# Patient Record
Sex: Female | Born: 1985 | Race: Black or African American | Hispanic: No | Smoking: Never smoker
Health system: Southern US, Community
[De-identification: ages and names within clinical notes are randomized; demographics above are authoritative.]

## PROBLEM LIST (undated history)

## (undated) ENCOUNTER — Inpatient Hospital Stay (HOSPITAL_COMMUNITY): Payer: Self-pay

## (undated) DIAGNOSIS — D649 Anemia, unspecified: Secondary | ICD-10-CM

## (undated) DIAGNOSIS — G43909 Migraine, unspecified, not intractable, without status migrainosus: Secondary | ICD-10-CM

## (undated) DIAGNOSIS — K219 Gastro-esophageal reflux disease without esophagitis: Secondary | ICD-10-CM

## (undated) DIAGNOSIS — N390 Urinary tract infection, site not specified: Secondary | ICD-10-CM

## (undated) DIAGNOSIS — O039 Complete or unspecified spontaneous abortion without complication: Secondary | ICD-10-CM

## (undated) DIAGNOSIS — F329 Major depressive disorder, single episode, unspecified: Secondary | ICD-10-CM

## (undated) DIAGNOSIS — L309 Dermatitis, unspecified: Secondary | ICD-10-CM

## (undated) DIAGNOSIS — K289 Gastrojejunal ulcer, unspecified as acute or chronic, without hemorrhage or perforation: Secondary | ICD-10-CM

## (undated) DIAGNOSIS — F419 Anxiety disorder, unspecified: Secondary | ICD-10-CM

## (undated) DIAGNOSIS — Z9109 Other allergy status, other than to drugs and biological substances: Secondary | ICD-10-CM

## (undated) DIAGNOSIS — A749 Chlamydial infection, unspecified: Secondary | ICD-10-CM

## (undated) DIAGNOSIS — R7303 Prediabetes: Secondary | ICD-10-CM

## (undated) DIAGNOSIS — N83209 Unspecified ovarian cyst, unspecified side: Secondary | ICD-10-CM

## (undated) DIAGNOSIS — G5600 Carpal tunnel syndrome, unspecified upper limb: Secondary | ICD-10-CM

## (undated) HISTORY — DX: Dermatitis, unspecified: L30.9

## (undated) HISTORY — PX: TYMPANOSTOMY TUBE PLACEMENT: SHX32

## (undated) HISTORY — DX: Gastro-esophageal reflux disease without esophagitis: K21.9

## (undated) HISTORY — PX: CARPAL TUNNEL RELEASE: SHX101

---
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
## 2006-05-17 DIAGNOSIS — F32A Depression, unspecified: Secondary | ICD-10-CM

## 2006-05-17 DIAGNOSIS — F419 Anxiety disorder, unspecified: Secondary | ICD-10-CM

## 2006-05-17 HISTORY — DX: Anxiety disorder, unspecified: F41.9

## 2006-05-17 HISTORY — DX: Depression, unspecified: F32.A

## 2006-10-17 ENCOUNTER — Emergency Department (HOSPITAL_COMMUNITY): Admission: EM | Admit: 2006-10-17 | Discharge: 2006-10-17 | Payer: Self-pay | Admitting: Emergency Medicine

## 2006-11-30 ENCOUNTER — Emergency Department (HOSPITAL_COMMUNITY): Admission: EM | Admit: 2006-11-30 | Discharge: 2006-12-01 | Payer: Self-pay | Admitting: Emergency Medicine

## 2008-05-05 ENCOUNTER — Inpatient Hospital Stay (HOSPITAL_COMMUNITY): Admission: AD | Admit: 2008-05-05 | Discharge: 2008-05-05 | Payer: Self-pay | Admitting: Obstetrics & Gynecology

## 2008-05-05 ENCOUNTER — Ambulatory Visit: Payer: Self-pay | Admitting: Obstetrics and Gynecology

## 2008-05-14 ENCOUNTER — Inpatient Hospital Stay (HOSPITAL_COMMUNITY): Admission: AD | Admit: 2008-05-14 | Discharge: 2008-05-14 | Payer: Self-pay | Admitting: Obstetrics & Gynecology

## 2008-05-15 ENCOUNTER — Encounter: Payer: Self-pay | Admitting: Obstetrics and Gynecology

## 2008-05-15 ENCOUNTER — Ambulatory Visit: Payer: Self-pay | Admitting: Family Medicine

## 2008-05-15 LAB — CONVERTED CEMR LAB
Chlamydia, DNA Probe: NEGATIVE
GC Probe Amp, Genital: NEGATIVE

## 2008-05-16 ENCOUNTER — Encounter: Payer: Self-pay | Admitting: Obstetrics and Gynecology

## 2008-05-22 ENCOUNTER — Ambulatory Visit: Payer: Self-pay | Admitting: Family Medicine

## 2008-05-22 ENCOUNTER — Ambulatory Visit (HOSPITAL_COMMUNITY): Admission: RE | Admit: 2008-05-22 | Discharge: 2008-05-22 | Payer: Self-pay | Admitting: Family Medicine

## 2008-05-22 ENCOUNTER — Encounter: Payer: Self-pay | Admitting: Obstetrics and Gynecology

## 2008-05-22 LAB — CONVERTED CEMR LAB: Antibody Screen: NEGATIVE

## 2008-05-29 ENCOUNTER — Encounter: Payer: Self-pay | Admitting: Obstetrics and Gynecology

## 2008-05-29 ENCOUNTER — Ambulatory Visit: Payer: Self-pay | Admitting: Family Medicine

## 2008-06-02 ENCOUNTER — Ambulatory Visit: Payer: Self-pay | Admitting: Obstetrics and Gynecology

## 2008-06-02 ENCOUNTER — Inpatient Hospital Stay (HOSPITAL_COMMUNITY): Admission: AD | Admit: 2008-06-02 | Discharge: 2008-06-02 | Payer: Self-pay | Admitting: Obstetrics & Gynecology

## 2008-06-03 ENCOUNTER — Inpatient Hospital Stay (HOSPITAL_COMMUNITY): Admission: AD | Admit: 2008-06-03 | Discharge: 2008-06-03 | Payer: Self-pay | Admitting: Obstetrics & Gynecology

## 2008-06-03 ENCOUNTER — Ambulatory Visit: Payer: Self-pay | Admitting: Obstetrics and Gynecology

## 2008-06-04 ENCOUNTER — Ambulatory Visit: Payer: Self-pay | Admitting: Family Medicine

## 2008-06-04 ENCOUNTER — Inpatient Hospital Stay (HOSPITAL_COMMUNITY): Admission: AD | Admit: 2008-06-04 | Discharge: 2008-06-07 | Payer: Self-pay | Admitting: Obstetrics & Gynecology

## 2008-07-01 ENCOUNTER — Emergency Department (HOSPITAL_COMMUNITY): Admission: EM | Admit: 2008-07-01 | Discharge: 2008-07-01 | Payer: Self-pay | Admitting: Emergency Medicine

## 2008-08-19 ENCOUNTER — Emergency Department (HOSPITAL_COMMUNITY): Admission: EM | Admit: 2008-08-19 | Discharge: 2008-08-19 | Payer: Self-pay | Admitting: Emergency Medicine

## 2008-11-07 ENCOUNTER — Emergency Department (HOSPITAL_COMMUNITY): Admission: EM | Admit: 2008-11-07 | Discharge: 2008-11-08 | Payer: Self-pay | Admitting: Emergency Medicine

## 2009-02-12 ENCOUNTER — Emergency Department (HOSPITAL_COMMUNITY): Admission: EM | Admit: 2009-02-12 | Discharge: 2009-02-12 | Payer: Self-pay | Admitting: Emergency Medicine

## 2009-03-03 ENCOUNTER — Emergency Department (HOSPITAL_COMMUNITY): Admission: EM | Admit: 2009-03-03 | Discharge: 2009-03-03 | Payer: Self-pay | Admitting: Emergency Medicine

## 2009-06-17 ENCOUNTER — Emergency Department (HOSPITAL_COMMUNITY): Admission: EM | Admit: 2009-06-17 | Discharge: 2009-06-17 | Payer: Self-pay | Admitting: Family Medicine

## 2009-07-17 ENCOUNTER — Inpatient Hospital Stay (HOSPITAL_COMMUNITY): Admission: AD | Admit: 2009-07-17 | Discharge: 2009-07-17 | Payer: Self-pay | Admitting: Obstetrics

## 2009-08-30 ENCOUNTER — Emergency Department (HOSPITAL_COMMUNITY): Admission: EM | Admit: 2009-08-30 | Discharge: 2009-08-30 | Payer: Self-pay | Admitting: Emergency Medicine

## 2009-11-28 ENCOUNTER — Inpatient Hospital Stay (HOSPITAL_COMMUNITY): Admission: AD | Admit: 2009-11-28 | Discharge: 2009-11-28 | Payer: Self-pay | Admitting: Obstetrics

## 2009-12-14 ENCOUNTER — Ambulatory Visit: Payer: Self-pay | Admitting: Family

## 2009-12-14 ENCOUNTER — Inpatient Hospital Stay (HOSPITAL_COMMUNITY): Admission: AD | Admit: 2009-12-14 | Discharge: 2009-12-15 | Payer: Self-pay | Admitting: Obstetrics

## 2010-01-23 ENCOUNTER — Inpatient Hospital Stay (HOSPITAL_COMMUNITY): Admission: AD | Admit: 2010-01-23 | Discharge: 2010-01-23 | Payer: Self-pay | Admitting: Obstetrics

## 2010-02-02 ENCOUNTER — Ambulatory Visit (HOSPITAL_COMMUNITY): Admission: RE | Admit: 2010-02-02 | Discharge: 2010-02-02 | Payer: Self-pay | Admitting: Obstetrics

## 2010-02-05 ENCOUNTER — Inpatient Hospital Stay (HOSPITAL_COMMUNITY): Admission: AD | Admit: 2010-02-05 | Discharge: 2010-02-05 | Payer: Self-pay | Admitting: Obstetrics

## 2010-02-05 ENCOUNTER — Ambulatory Visit (HOSPITAL_COMMUNITY): Admission: RE | Admit: 2010-02-05 | Discharge: 2010-02-05 | Payer: Self-pay | Admitting: Obstetrics

## 2010-02-07 ENCOUNTER — Inpatient Hospital Stay (HOSPITAL_COMMUNITY): Admission: AD | Admit: 2010-02-07 | Discharge: 2010-02-09 | Payer: Self-pay | Admitting: Obstetrics

## 2010-03-09 ENCOUNTER — Emergency Department (HOSPITAL_COMMUNITY): Admission: EM | Admit: 2010-03-09 | Discharge: 2010-03-10 | Payer: Self-pay | Admitting: Emergency Medicine

## 2010-04-13 ENCOUNTER — Emergency Department (HOSPITAL_COMMUNITY)
Admission: EM | Admit: 2010-04-13 | Discharge: 2010-04-13 | Payer: Self-pay | Source: Home / Self Care | Admitting: Emergency Medicine

## 2010-05-19 ENCOUNTER — Emergency Department (HOSPITAL_COMMUNITY)
Admission: EM | Admit: 2010-05-19 | Discharge: 2010-05-19 | Payer: Self-pay | Source: Home / Self Care | Admitting: Emergency Medicine

## 2010-05-19 ENCOUNTER — Emergency Department (HOSPITAL_COMMUNITY)
Admission: EM | Admit: 2010-05-19 | Discharge: 2010-05-19 | Payer: Self-pay | Source: Home / Self Care | Admitting: Family Medicine

## 2010-06-07 ENCOUNTER — Encounter: Payer: Self-pay | Admitting: Obstetrics

## 2010-06-10 ENCOUNTER — Emergency Department (HOSPITAL_COMMUNITY)
Admission: EM | Admit: 2010-06-10 | Discharge: 2010-06-10 | Payer: Self-pay | Source: Home / Self Care | Admitting: Emergency Medicine

## 2010-06-10 LAB — POCT I-STAT, CHEM 8
BUN: 10 mg/dL (ref 6–23)
Calcium, Ion: 1.19 mmol/L (ref 1.12–1.32)
Chloride: 107 mEq/L (ref 96–112)
Creatinine, Ser: 1 mg/dL (ref 0.4–1.2)
Glucose, Bld: 76 mg/dL (ref 70–99)
HCT: 38 % (ref 36.0–46.0)
Hemoglobin: 12.9 g/dL (ref 12.0–15.0)
Potassium: 3.9 mEq/L (ref 3.5–5.1)
Sodium: 141 mEq/L (ref 135–145)
TCO2: 25 mmol/L (ref 0–100)

## 2010-06-10 LAB — POCT CARDIAC MARKERS
CKMB, poc: <1 ng/mL — ABNORMAL LOW (ref 1.0–8.0)
Myoglobin, poc: 35.5 ng/mL (ref 12–200)
Troponin i, poc: <0.05 ng/mL (ref 0.00–0.09)

## 2010-06-26 ENCOUNTER — Inpatient Hospital Stay (INDEPENDENT_AMBULATORY_CARE_PROVIDER_SITE_OTHER)
Admit: 2010-06-26 | Discharge: 2010-06-26 | Disposition: A | Discharge status: Home / Self Care | Payer: Medicaid Other | Attending: Family Medicine | Admitting: Family Medicine

## 2010-06-26 ENCOUNTER — Emergency Department (HOSPITAL_COMMUNITY)
Admission: EM | Admit: 2010-06-26 | Discharge: 2010-06-26 | Discharge status: Against Medical Advice | Payer: Medicaid Other | Attending: Emergency Medicine | Admitting: Emergency Medicine

## 2010-06-26 DIAGNOSIS — F329 Major depressive disorder, single episode, unspecified: Secondary | ICD-10-CM

## 2010-06-26 DIAGNOSIS — I10 Essential (primary) hypertension: Secondary | ICD-10-CM | POA: Insufficient documentation

## 2010-06-26 DIAGNOSIS — F411 Generalized anxiety disorder: Secondary | ICD-10-CM | POA: Insufficient documentation

## 2010-06-26 DIAGNOSIS — J45909 Unspecified asthma, uncomplicated: Secondary | ICD-10-CM | POA: Insufficient documentation

## 2010-06-26 DIAGNOSIS — F3289 Other specified depressive episodes: Secondary | ICD-10-CM | POA: Insufficient documentation

## 2010-06-26 DIAGNOSIS — K219 Gastro-esophageal reflux disease without esophagitis: Secondary | ICD-10-CM | POA: Insufficient documentation

## 2010-07-27 LAB — DIFFERENTIAL
Basophils Absolute: 0 10*3/uL (ref 0.0–0.1)
Basophils Relative: 0 % (ref 0–1)
Eosinophils Absolute: 0.3 10*3/uL (ref 0.0–0.7)
Eosinophils Relative: 4 % (ref 0–5)
Lymphocytes Relative: 40 % (ref 12–46)
Lymphs Abs: 3.2 10*3/uL (ref 0.7–4.0)
Monocytes Absolute: 0.4 10*3/uL (ref 0.1–1.0)
Monocytes Relative: 5 % (ref 3–12)
Neutro Abs: 4.1 10*3/uL (ref 1.7–7.7)
Neutrophils Relative %: 51 % (ref 43–77)

## 2010-07-27 LAB — COMPREHENSIVE METABOLIC PANEL
ALT: 29 U/L (ref 0–35)
AST: 20 U/L (ref 0–37)
Albumin: 3.6 g/dL (ref 3.5–5.2)
Alkaline Phosphatase: 75 U/L (ref 39–117)
BUN: 8 mg/dL (ref 6–23)
CO2: 24 mEq/L (ref 19–32)
Calcium: 9.2 mg/dL (ref 8.4–10.5)
Chloride: 107 mEq/L (ref 96–112)
Creatinine, Ser: 0.75 mg/dL (ref 0.4–1.2)
GFR calc Af Amer: >60 mL/min (ref >60)
GFR calc non Af Amer: >60 mL/min (ref >60)
Glucose, Bld: 101 mg/dL — ABNORMAL HIGH (ref 70–99)
Potassium: 3.8 mEq/L (ref 3.5–5.1)
Sodium: 140 mEq/L (ref 135–145)
Total Bilirubin: 0.2 mg/dL — ABNORMAL LOW (ref 0.3–1.2)
Total Protein: 7.6 g/dL (ref 6.0–8.3)

## 2010-07-27 LAB — CBC
HCT: 38 % (ref 36.0–46.0)
Hemoglobin: 12.1 g/dL (ref 12.0–15.0)
MCH: 26.2 pg (ref 26.0–34.0)
MCHC: 31.8 g/dL (ref 30.0–36.0)
MCV: 82.4 fL (ref 78.0–100.0)
Platelets: 340 10*3/uL (ref 150–400)
RBC: 4.61 MIL/uL (ref 3.87–5.11)
RDW: 14.1 % (ref 11.5–15.5)
WBC: 8.1 10*3/uL (ref 4.0–10.5)

## 2010-07-27 LAB — URINALYSIS, ROUTINE W REFLEX MICROSCOPIC
Bilirubin Urine: NEGATIVE
Glucose, UA: NEGATIVE mg/dL
Hgb urine dipstick: NEGATIVE
Ketones, ur: NEGATIVE mg/dL
Nitrite: NEGATIVE
Protein, ur: NEGATIVE mg/dL
Specific Gravity, Urine: 1.009 (ref 1.005–1.030)
Urobilinogen, UA: 0.2 mg/dL (ref 0.0–1.0)
pH: 6 (ref 5.0–8.0)

## 2010-07-27 LAB — POCT URINALYSIS DIPSTICK
Bilirubin Urine: NEGATIVE
Glucose, UA: NEGATIVE mg/dL
Ketones, ur: NEGATIVE mg/dL
Nitrite: NEGATIVE
Protein, ur: NEGATIVE mg/dL
Specific Gravity, Urine: 1.01 (ref 1.005–1.030)
Urobilinogen, UA: 1 mg/dL (ref 0.0–1.0)
pH: 7 (ref 5.0–8.0)

## 2010-07-27 LAB — URINE MICROSCOPIC-ADD ON

## 2010-07-27 LAB — POCT PREGNANCY, URINE
Preg Test, Ur: NEGATIVE
Preg Test, Ur: NEGATIVE

## 2010-07-27 LAB — LIPASE, BLOOD: Lipase: 22 U/L (ref 11–59)

## 2010-07-29 LAB — URINALYSIS, ROUTINE W REFLEX MICROSCOPIC
Bilirubin Urine: NEGATIVE
Glucose, UA: NEGATIVE mg/dL
Hgb urine dipstick: NEGATIVE
Specific Gravity, Urine: 1.024 (ref 1.005–1.030)
Urobilinogen, UA: 1 mg/dL (ref 0.0–1.0)
pH: 6.5 (ref 5.0–8.0)

## 2010-07-29 LAB — DIFFERENTIAL
Basophils Absolute: 0 10*3/uL (ref 0.0–0.1)
Lymphocytes Relative: 41 % (ref 12–46)
Monocytes Absolute: 0.7 10*3/uL (ref 0.1–1.0)
Monocytes Relative: 6 % (ref 3–12)
Neutro Abs: 5 10*3/uL (ref 1.7–7.7)
Neutrophils Relative %: 47 % (ref 43–77)

## 2010-07-29 LAB — URINE MICROSCOPIC-ADD ON

## 2010-07-29 LAB — CBC
HCT: 41.4 % (ref 36.0–46.0)
Hemoglobin: 13 g/dL (ref 12.0–15.0)
WBC: 10.7 10*3/uL — ABNORMAL HIGH (ref 4.0–10.5)

## 2010-07-29 LAB — BASIC METABOLIC PANEL
Calcium: 9 mg/dL (ref 8.4–10.5)
GFR calc Af Amer: >60 mL/min (ref >60)
GFR calc non Af Amer: >60 mL/min (ref >60)
Potassium: 4 mEq/L (ref 3.5–5.1)
Sodium: 139 mEq/L (ref 135–145)

## 2010-07-29 LAB — URINE CULTURE

## 2010-07-29 LAB — WET PREP, GENITAL

## 2010-07-30 LAB — CBC
HCT: 29.2 % — ABNORMAL LOW (ref 36.0–46.0)
HCT: 34.1 % — ABNORMAL LOW (ref 36.0–46.0)
Hemoglobin: 11.1 g/dL — ABNORMAL LOW (ref 12.0–15.0)
Hemoglobin: 9.5 g/dL — ABNORMAL LOW (ref 12.0–15.0)
MCHC: 32.5 g/dL (ref 30.0–36.0)
MCV: 82.6 fL (ref 78.0–100.0)
RBC: 4.1 MIL/uL (ref 3.87–5.11)
RDW: 14.8 % (ref 11.5–15.5)
WBC: 17.2 10*3/uL — ABNORMAL HIGH (ref 4.0–10.5)

## 2010-07-30 LAB — RPR: RPR Ser Ql: NONREACTIVE

## 2010-07-31 LAB — URINALYSIS, ROUTINE W REFLEX MICROSCOPIC
Bilirubin Urine: NEGATIVE
Glucose, UA: 100 mg/dL — AB
Hgb urine dipstick: NEGATIVE
Specific Gravity, Urine: >1.03 — ABNORMAL HIGH (ref 1.005–1.030)
Urobilinogen, UA: 4 mg/dL — ABNORMAL HIGH (ref 0.0–1.0)
pH: 6.5 (ref 5.0–8.0)

## 2010-07-31 LAB — GLUCOSE, CAPILLARY: Glucose-Capillary: 88 mg/dL (ref 70–99)

## 2010-07-31 LAB — URINE MICROSCOPIC-ADD ON

## 2010-07-31 LAB — WET PREP, GENITAL
Trich, Wet Prep: NONE SEEN
Yeast Wet Prep HPF POC: NONE SEEN

## 2010-07-31 LAB — URINE CULTURE: Culture: NO GROWTH

## 2010-08-01 LAB — URINALYSIS, ROUTINE W REFLEX MICROSCOPIC
Hgb urine dipstick: NEGATIVE
Nitrite: NEGATIVE
Protein, ur: NEGATIVE mg/dL
Specific Gravity, Urine: >1.03 — ABNORMAL HIGH (ref 1.005–1.030)
Urobilinogen, UA: 1 mg/dL (ref 0.0–1.0)

## 2010-08-01 LAB — DIFFERENTIAL
Basophils Absolute: 0.1 10*3/uL (ref 0.0–0.1)
Eosinophils Absolute: 0.3 10*3/uL (ref 0.0–0.7)
Eosinophils Relative: 3 % (ref 0–5)
Lymphocytes Relative: 24 % (ref 12–46)
Lymphs Abs: 2.6 10*3/uL (ref 0.7–4.0)
Neutrophils Relative %: 66 % (ref 43–77)

## 2010-08-01 LAB — CBC
MCV: 85.3 fL (ref 78.0–100.0)
Platelets: 213 10*3/uL (ref 150–400)
RBC: 3.67 MIL/uL — ABNORMAL LOW (ref 3.87–5.11)
RDW: 13.7 % (ref 11.5–15.5)
WBC: 11 10*3/uL — ABNORMAL HIGH (ref 4.0–10.5)

## 2010-08-04 LAB — URINALYSIS, ROUTINE W REFLEX MICROSCOPIC
Ketones, ur: NEGATIVE mg/dL
Nitrite: NEGATIVE
Protein, ur: NEGATIVE mg/dL
pH: 6 (ref 5.0–8.0)

## 2010-08-04 LAB — CBC
Platelets: 268 10*3/uL (ref 150–400)
RBC: 4.02 MIL/uL (ref 3.87–5.11)
WBC: 10.8 10*3/uL — ABNORMAL HIGH (ref 4.0–10.5)

## 2010-08-04 LAB — BASIC METABOLIC PANEL
BUN: 8 mg/dL (ref 6–23)
Calcium: 9 mg/dL (ref 8.4–10.5)
Creatinine, Ser: 0.61 mg/dL (ref 0.4–1.2)
GFR calc Af Amer: >60 mL/min (ref >60)
GFR calc non Af Amer: >60 mL/min (ref >60)

## 2010-08-04 LAB — DIFFERENTIAL
Eosinophils Absolute: 0.7 10*3/uL (ref 0.0–0.7)
Lymphocytes Relative: 25 % (ref 12–46)
Lymphs Abs: 2.7 10*3/uL (ref 0.7–4.0)
Neutro Abs: 6.6 10*3/uL (ref 1.7–7.7)
Neutrophils Relative %: 61 % (ref 43–77)

## 2010-08-05 ENCOUNTER — Emergency Department (HOSPITAL_COMMUNITY)
Admission: EM | Admit: 2010-08-05 | Discharge: 2010-08-05 | Disposition: A | Discharge status: Home / Self Care | Payer: Medicaid Other | Attending: Emergency Medicine | Admitting: Emergency Medicine

## 2010-08-05 ENCOUNTER — Emergency Department (HOSPITAL_COMMUNITY): Payer: Medicaid Other

## 2010-08-05 DIAGNOSIS — M25569 Pain in unspecified knee: Secondary | ICD-10-CM | POA: Insufficient documentation

## 2010-08-05 DIAGNOSIS — G8929 Other chronic pain: Secondary | ICD-10-CM | POA: Insufficient documentation

## 2010-08-05 DIAGNOSIS — J45909 Unspecified asthma, uncomplicated: Secondary | ICD-10-CM | POA: Insufficient documentation

## 2010-08-05 DIAGNOSIS — I10 Essential (primary) hypertension: Secondary | ICD-10-CM | POA: Insufficient documentation

## 2010-08-05 DIAGNOSIS — M25579 Pain in unspecified ankle and joints of unspecified foot: Secondary | ICD-10-CM | POA: Insufficient documentation

## 2010-08-05 DIAGNOSIS — M79609 Pain in unspecified limb: Secondary | ICD-10-CM | POA: Insufficient documentation

## 2010-08-05 DIAGNOSIS — K219 Gastro-esophageal reflux disease without esophagitis: Secondary | ICD-10-CM | POA: Insufficient documentation

## 2010-08-05 LAB — POCT URINALYSIS DIP (DEVICE)
Bilirubin Urine: NEGATIVE
Glucose, UA: NEGATIVE mg/dL
Ketones, ur: NEGATIVE mg/dL
Nitrite: NEGATIVE
Specific Gravity, Urine: 1.02 (ref 1.005–1.030)

## 2010-08-05 LAB — GC/CHLAMYDIA PROBE AMP, GENITAL
Chlamydia, DNA Probe: NEGATIVE
GC Probe Amp, Genital: NEGATIVE

## 2010-08-05 LAB — WET PREP, GENITAL: Yeast Wet Prep HPF POC: NONE SEEN

## 2010-08-10 LAB — URINALYSIS, ROUTINE W REFLEX MICROSCOPIC
Bilirubin Urine: NEGATIVE
Glucose, UA: 250 mg/dL — AB
Nitrite: NEGATIVE
Specific Gravity, Urine: 1.02 (ref 1.005–1.030)
pH: 6.5 (ref 5.0–8.0)

## 2010-08-10 LAB — URINE MICROSCOPIC-ADD ON

## 2010-08-20 LAB — URINALYSIS, ROUTINE W REFLEX MICROSCOPIC
Bilirubin Urine: NEGATIVE
Nitrite: NEGATIVE
Specific Gravity, Urine: 1.028 (ref 1.005–1.030)
Urobilinogen, UA: 1 mg/dL (ref 0.0–1.0)

## 2010-08-20 LAB — POCT PREGNANCY, URINE: Preg Test, Ur: NEGATIVE

## 2010-08-21 LAB — URINALYSIS, ROUTINE W REFLEX MICROSCOPIC
Bilirubin Urine: NEGATIVE
Ketones, ur: NEGATIVE mg/dL
Nitrite: NEGATIVE
pH: 6 (ref 5.0–8.0)

## 2010-08-21 LAB — URINE MICROSCOPIC-ADD ON

## 2010-08-21 LAB — POCT PREGNANCY, URINE: Preg Test, Ur: NEGATIVE

## 2010-08-31 LAB — GLUCOSE, CAPILLARY
Glucose-Capillary: 83 mg/dL (ref 70–99)
Glucose-Capillary: 94 mg/dL (ref 70–99)

## 2010-08-31 LAB — POCT URINALYSIS DIP (DEVICE)
Bilirubin Urine: NEGATIVE
Glucose, UA: 250 mg/dL — AB
Glucose, UA: 500 mg/dL — AB
Nitrite: NEGATIVE
Nitrite: NEGATIVE
Specific Gravity, Urine: 1.015 (ref 1.005–1.030)
Urobilinogen, UA: 0.2 mg/dL (ref 0.0–1.0)

## 2010-08-31 LAB — CBC
Hemoglobin: 11.1 g/dL — ABNORMAL LOW (ref 12.0–15.0)
RBC: 3.95 MIL/uL (ref 3.87–5.11)
WBC: 14.4 10*3/uL — ABNORMAL HIGH (ref 4.0–10.5)

## 2010-09-24 ENCOUNTER — Emergency Department (HOSPITAL_COMMUNITY)
Admission: EM | Admit: 2010-09-24 | Discharge: 2010-09-25 | Disposition: A | Discharge status: Home / Self Care | Payer: Medicaid Other | Attending: Emergency Medicine | Admitting: Emergency Medicine

## 2010-09-24 DIAGNOSIS — F329 Major depressive disorder, single episode, unspecified: Secondary | ICD-10-CM | POA: Insufficient documentation

## 2010-09-24 DIAGNOSIS — Z3202 Encounter for pregnancy test, result negative: Secondary | ICD-10-CM | POA: Insufficient documentation

## 2010-09-24 DIAGNOSIS — K219 Gastro-esophageal reflux disease without esophagitis: Secondary | ICD-10-CM | POA: Insufficient documentation

## 2010-09-24 DIAGNOSIS — F3289 Other specified depressive episodes: Secondary | ICD-10-CM | POA: Insufficient documentation

## 2010-09-24 DIAGNOSIS — R21 Rash and other nonspecific skin eruption: Secondary | ICD-10-CM | POA: Insufficient documentation

## 2010-09-24 DIAGNOSIS — J45909 Unspecified asthma, uncomplicated: Secondary | ICD-10-CM | POA: Insufficient documentation

## 2010-09-25 LAB — URINE MICROSCOPIC-ADD ON

## 2010-09-25 LAB — URINALYSIS, ROUTINE W REFLEX MICROSCOPIC
Ketones, ur: NEGATIVE mg/dL
Nitrite: NEGATIVE
Protein, ur: NEGATIVE mg/dL

## 2010-09-25 LAB — POCT PREGNANCY, URINE: Preg Test, Ur: NEGATIVE

## 2010-09-27 ENCOUNTER — Inpatient Hospital Stay (INDEPENDENT_AMBULATORY_CARE_PROVIDER_SITE_OTHER)
Admission: RE | Admit: 2010-09-27 | Discharge: 2010-09-27 | Disposition: A | Discharge status: Home / Self Care | Payer: Medicaid Other | Source: Ambulatory Visit

## 2010-09-27 DIAGNOSIS — A499 Bacterial infection, unspecified: Secondary | ICD-10-CM

## 2010-09-27 DIAGNOSIS — M79609 Pain in unspecified limb: Secondary | ICD-10-CM

## 2010-09-27 DIAGNOSIS — N760 Acute vaginitis: Secondary | ICD-10-CM

## 2010-09-27 LAB — WET PREP, GENITAL: Trich, Wet Prep: NONE SEEN

## 2010-09-28 LAB — POCT URINALYSIS DIP (DEVICE)
Bilirubin Urine: NEGATIVE
Glucose, UA: NEGATIVE mg/dL
Hgb urine dipstick: NEGATIVE
Ketones, ur: NEGATIVE mg/dL
Nitrite: NEGATIVE
Protein, ur: NEGATIVE mg/dL
Specific Gravity, Urine: 1.02 (ref 1.005–1.030)
Urobilinogen, UA: 1 mg/dL (ref 0.0–1.0)
pH: 7.5 (ref 5.0–8.0)

## 2010-09-28 LAB — GC/CHLAMYDIA PROBE AMP, GENITAL
Chlamydia, DNA Probe: NEGATIVE
GC Probe Amp, Genital: NEGATIVE

## 2010-09-28 LAB — POCT PREGNANCY, URINE: Preg Test, Ur: NEGATIVE

## 2010-11-12 ENCOUNTER — Emergency Department (HOSPITAL_COMMUNITY)
Admission: EM | Admit: 2010-11-12 | Discharge: 2010-11-12 | Disposition: A | Discharge status: Home / Self Care | Payer: Medicaid Other | Attending: Emergency Medicine | Admitting: Emergency Medicine

## 2010-11-12 DIAGNOSIS — R0602 Shortness of breath: Secondary | ICD-10-CM | POA: Insufficient documentation

## 2010-11-12 DIAGNOSIS — R0989 Other specified symptoms and signs involving the circulatory and respiratory systems: Secondary | ICD-10-CM | POA: Insufficient documentation

## 2010-11-12 DIAGNOSIS — R0609 Other forms of dyspnea: Secondary | ICD-10-CM | POA: Insufficient documentation

## 2010-11-12 DIAGNOSIS — J45909 Unspecified asthma, uncomplicated: Secondary | ICD-10-CM | POA: Insufficient documentation

## 2010-11-12 DIAGNOSIS — J3489 Other specified disorders of nose and nasal sinuses: Secondary | ICD-10-CM | POA: Insufficient documentation

## 2010-11-12 DIAGNOSIS — Z9109 Other allergy status, other than to drugs and biological substances: Secondary | ICD-10-CM | POA: Insufficient documentation

## 2010-11-12 DIAGNOSIS — J309 Allergic rhinitis, unspecified: Secondary | ICD-10-CM | POA: Insufficient documentation

## 2010-12-09 ENCOUNTER — Emergency Department (HOSPITAL_COMMUNITY)
Admission: EM | Admit: 2010-12-09 | Discharge: 2010-12-09 | Disposition: A | Discharge status: Home / Self Care | Payer: Worker's Compensation | Attending: Emergency Medicine | Admitting: Emergency Medicine

## 2010-12-09 ENCOUNTER — Emergency Department (HOSPITAL_COMMUNITY): Payer: Worker's Compensation

## 2010-12-09 DIAGNOSIS — J45909 Unspecified asthma, uncomplicated: Secondary | ICD-10-CM | POA: Insufficient documentation

## 2010-12-09 DIAGNOSIS — M79609 Pain in unspecified limb: Secondary | ICD-10-CM | POA: Insufficient documentation

## 2010-12-09 DIAGNOSIS — K219 Gastro-esophageal reflux disease without esophagitis: Secondary | ICD-10-CM | POA: Insufficient documentation

## 2010-12-09 DIAGNOSIS — S6000XA Contusion of unspecified finger without damage to nail, initial encounter: Secondary | ICD-10-CM | POA: Insufficient documentation

## 2010-12-09 DIAGNOSIS — M7989 Other specified soft tissue disorders: Secondary | ICD-10-CM | POA: Insufficient documentation

## 2010-12-09 DIAGNOSIS — Y990 Civilian activity done for income or pay: Secondary | ICD-10-CM | POA: Insufficient documentation

## 2010-12-09 DIAGNOSIS — IMO0002 Reserved for concepts with insufficient information to code with codable children: Secondary | ICD-10-CM | POA: Insufficient documentation

## 2010-12-09 DIAGNOSIS — S6990XA Unspecified injury of unspecified wrist, hand and finger(s), initial encounter: Secondary | ICD-10-CM | POA: Insufficient documentation

## 2010-12-09 LAB — POCT PREGNANCY, URINE: Preg Test, Ur: NEGATIVE

## 2010-12-30 ENCOUNTER — Inpatient Hospital Stay (INDEPENDENT_AMBULATORY_CARE_PROVIDER_SITE_OTHER)
Admission: RE | Admit: 2010-12-30 | Discharge: 2010-12-30 | Disposition: A | Discharge status: Home / Self Care | Payer: Medicaid Other | Source: Ambulatory Visit | Attending: Family Medicine | Admitting: Family Medicine

## 2010-12-30 DIAGNOSIS — A599 Trichomoniasis, unspecified: Secondary | ICD-10-CM

## 2010-12-30 LAB — POCT URINALYSIS DIP (DEVICE)
Nitrite: NEGATIVE
Protein, ur: NEGATIVE mg/dL
Specific Gravity, Urine: >=1.03 (ref 1.005–1.030)
Urobilinogen, UA: 1 mg/dL (ref 0.0–1.0)

## 2010-12-30 LAB — WET PREP, GENITAL

## 2010-12-31 LAB — GC/CHLAMYDIA PROBE AMP, GENITAL
Chlamydia, DNA Probe: NEGATIVE
GC Probe Amp, Genital: NEGATIVE

## 2011-02-19 LAB — URINALYSIS, ROUTINE W REFLEX MICROSCOPIC
Bilirubin Urine: NEGATIVE
Bilirubin Urine: NEGATIVE
Glucose, UA: 500 mg/dL — AB
Hgb urine dipstick: NEGATIVE
Hgb urine dipstick: NEGATIVE
Protein, ur: NEGATIVE mg/dL
Protein, ur: NEGATIVE mg/dL
Urobilinogen, UA: 0.2 mg/dL (ref 0.0–1.0)

## 2011-02-19 LAB — RAPID URINE DRUG SCREEN, HOSP PERFORMED
Amphetamines: NOT DETECTED
Barbiturates: NOT DETECTED
Benzodiazepines: NOT DETECTED
Tetrahydrocannabinol: NOT DETECTED

## 2011-02-19 LAB — POCT URINALYSIS DIP (DEVICE)
Glucose, UA: 500 mg/dL — AB
Nitrite: NEGATIVE

## 2011-02-19 LAB — RPR: RPR Ser Ql: NONREACTIVE

## 2011-02-19 LAB — GLUCOSE, CAPILLARY
Glucose-Capillary: 75 mg/dL (ref 70–99)
Glucose-Capillary: 83 mg/dL (ref 70–99)

## 2011-02-19 LAB — CBC
Hemoglobin: 10.7 g/dL — ABNORMAL LOW (ref 12.0–15.0)
MCHC: 33.1 g/dL (ref 30.0–36.0)
RBC: 3.74 MIL/uL — ABNORMAL LOW (ref 3.87–5.11)
WBC: 8.9 10*3/uL (ref 4.0–10.5)

## 2011-02-19 LAB — RUBELLA SCREEN: Rubella: 23.2 IU/mL — ABNORMAL HIGH

## 2011-02-19 LAB — HEPATITIS B SURFACE ANTIGEN: Hepatitis B Surface Ag: NEGATIVE

## 2011-02-19 LAB — URINE MICROSCOPIC-ADD ON

## 2011-02-19 LAB — ABO/RH: ABO/RH(D): A POS

## 2011-02-19 LAB — SICKLE CELL SCREEN: Sickle Cell Screen: NEGATIVE

## 2011-03-01 LAB — DIFFERENTIAL
Basophils Absolute: 0
Lymphocytes Relative: 32
Monocytes Absolute: 0.6
Monocytes Relative: 8
Neutro Abs: 4.4

## 2011-03-01 LAB — CBC
Hemoglobin: 11.3 — ABNORMAL LOW
RBC: 4.2

## 2011-03-01 LAB — URINALYSIS, ROUTINE W REFLEX MICROSCOPIC
Glucose, UA: NEGATIVE
Nitrite: NEGATIVE
Protein, ur: NEGATIVE
pH: 6.5

## 2011-03-01 LAB — URINE MICROSCOPIC-ADD ON

## 2011-03-01 LAB — I-STAT 8, (EC8 V) (CONVERTED LAB)
BUN: 12
Bicarbonate: 21
Chloride: 109
HCT: 38
Hemoglobin: 12.9
Operator id: 272551
Sodium: 139

## 2011-03-01 LAB — POCT I-STAT CREATININE: Creatinine, Ser: 0.8

## 2011-04-09 ENCOUNTER — Encounter: Payer: Self-pay | Admitting: *Deleted

## 2011-04-09 ENCOUNTER — Emergency Department (HOSPITAL_COMMUNITY)
Admission: EM | Admit: 2011-04-09 | Discharge: 2011-04-09 | Disposition: A | Discharge status: Home / Self Care | Payer: Medicaid Other | Attending: Emergency Medicine | Admitting: Emergency Medicine

## 2011-04-09 DIAGNOSIS — R3915 Urgency of urination: Secondary | ICD-10-CM | POA: Insufficient documentation

## 2011-04-09 DIAGNOSIS — J45909 Unspecified asthma, uncomplicated: Secondary | ICD-10-CM | POA: Insufficient documentation

## 2011-04-09 DIAGNOSIS — R109 Unspecified abdominal pain: Secondary | ICD-10-CM | POA: Insufficient documentation

## 2011-04-09 DIAGNOSIS — N949 Unspecified condition associated with female genital organs and menstrual cycle: Secondary | ICD-10-CM | POA: Insufficient documentation

## 2011-04-09 DIAGNOSIS — N760 Acute vaginitis: Secondary | ICD-10-CM | POA: Insufficient documentation

## 2011-04-09 DIAGNOSIS — S335XXA Sprain of ligaments of lumbar spine, initial encounter: Secondary | ICD-10-CM | POA: Insufficient documentation

## 2011-04-09 DIAGNOSIS — K219 Gastro-esophageal reflux disease without esophagitis: Secondary | ICD-10-CM | POA: Insufficient documentation

## 2011-04-09 DIAGNOSIS — R350 Frequency of micturition: Secondary | ICD-10-CM | POA: Insufficient documentation

## 2011-04-09 DIAGNOSIS — F411 Generalized anxiety disorder: Secondary | ICD-10-CM | POA: Insufficient documentation

## 2011-04-09 DIAGNOSIS — S39012A Strain of muscle, fascia and tendon of lower back, initial encounter: Secondary | ICD-10-CM

## 2011-04-09 DIAGNOSIS — Z79899 Other long term (current) drug therapy: Secondary | ICD-10-CM | POA: Insufficient documentation

## 2011-04-09 DIAGNOSIS — A499 Bacterial infection, unspecified: Secondary | ICD-10-CM | POA: Insufficient documentation

## 2011-04-09 DIAGNOSIS — R102 Pelvic and perineal pain: Secondary | ICD-10-CM

## 2011-04-09 DIAGNOSIS — R10819 Abdominal tenderness, unspecified site: Secondary | ICD-10-CM | POA: Insufficient documentation

## 2011-04-09 DIAGNOSIS — X58XXXA Exposure to other specified factors, initial encounter: Secondary | ICD-10-CM | POA: Insufficient documentation

## 2011-04-09 DIAGNOSIS — B9689 Other specified bacterial agents as the cause of diseases classified elsewhere: Secondary | ICD-10-CM | POA: Insufficient documentation

## 2011-04-09 HISTORY — DX: Anxiety disorder, unspecified: F41.9

## 2011-04-09 LAB — URINALYSIS, ROUTINE W REFLEX MICROSCOPIC
Bilirubin Urine: NEGATIVE
Hgb urine dipstick: NEGATIVE
Ketones, ur: NEGATIVE mg/dL
Nitrite: NEGATIVE
pH: 6 (ref 5.0–8.0)

## 2011-04-09 LAB — URINE MICROSCOPIC-ADD ON

## 2011-04-09 LAB — WET PREP, GENITAL: Trich, Wet Prep: NONE SEEN

## 2011-04-09 MED ORDER — HYDROCODONE-ACETAMINOPHEN 5-325 MG PO TABS
2.0000 | ORAL_TABLET | Freq: Once | ORAL | Status: AC
  Administered 2011-04-09: 2 via ORAL
  Filled 2011-04-09: qty 2

## 2011-04-09 MED ORDER — HYDROCODONE-ACETAMINOPHEN 5-325 MG PO TABS: 2.0000 | ORAL_TABLET | Freq: Once | ORAL | Status: AC

## 2011-04-09 MED ORDER — IBUPROFEN 600 MG PO TABS: 600.0000 mg | ORAL_TABLET | Freq: Four times a day (QID) | ORAL | Status: AC | PRN

## 2011-04-09 MED ORDER — GI COCKTAIL ~~LOC~~: 30.0000 mL | Freq: Once | ORAL | Status: DC

## 2011-04-09 MED ORDER — METRONIDAZOLE 500 MG PO TABS: 500.0000 mg | ORAL_TABLET | Freq: Two times a day (BID) | ORAL | Status: AC

## 2011-04-09 NOTE — ED Notes (Signed)
Hx. Of uti's. Urinating much.

## 2011-04-09 NOTE — ED Provider Notes (Signed)
History     CSN: [id number] Arrival date & time: 04/09/2011  2:46 PM   First MD Initiated Contact with Patient 04/09/11 1827      Chief Complaint  Patient presents with  . Abdominal Pain  . Back Pain    (Consider location/radiation/quality/duration/timing/severity/associated sxs/prior treatment) The history is provided by the patient.  25 yo presents with abdominal and back pain.  Back pain started 1 week ago, is located across lumbar region (no lateralization), is constant and stable.  No preceding fall/trauma but does lift heavy boxes.  Worse with lifting and turning torso.  Minimal improvement with pain meds at home.  Starting today, she has had suprapubic cramping discomfort that is constant and assoc with increased urinary frequency.  No dysuria.  No nausea, vomiting, diarrhea.  No fever.  Has had UTI before but no pyelo.  Not pregnant.  No sick contacts, travel, camping, bad food exposures, or recent antibiotics. Denies vaginal bleeding, discharge.  Uses protection when having intercourse.  Overall severity described as moderate.  No h/o abdominal surgery.   Past Medical History  Diagnosis Date  . Asthma   . Migraine   . Reflux   . Anxiety     History reviewed. No pertinent past surgical history.  History reviewed. No pertinent family history.  History  Substance Use Topics  . Smoking status: Never Smoker   . Smokeless tobacco: Not on file  . Alcohol Use: No    OB History    Grav Para Term Preterm Abortions TAB SAB Ect Mult Living                  Review of Systems  Constitutional: Negative for fever and chills.  HENT: Negative for facial swelling.   Eyes: Negative for visual disturbance.  Respiratory: Negative for cough, chest tightness, shortness of breath and wheezing.   Cardiovascular: Negative for chest pain.  Gastrointestinal: Negative for nausea, vomiting and diarrhea.  Genitourinary: Positive for urgency and frequency. Negative for dysuria and  difficulty urinating.  Skin: Negative for rash.  Neurological: Negative for weakness and numbness.  Psychiatric/Behavioral: Negative for behavioral problems and confusion.  All other systems reviewed and are negative.    Allergies  Latex and Zofran  Home Medications   Current Outpatient Rx  Name Route Sig Dispense Refill  . ALBUTEROL SULFATE HFA 108 (90 BASE) MCG/ACT IN AERS Inhalation Inhale 2 puffs into the lungs every 6 (six) hours as needed. For shortness of breath     . NAPROXEN SODIUM 275 MG PO TABS Oral Take 275 mg by mouth every 8 (eight) hours as needed. For pain     . TRAMADOL HCL 50 MG PO TABS Oral Take 50 mg by mouth every 8 (eight) hours as needed. For pain Maximum dose= 8 tablets per day     . HYDROCODONE-ACETAMINOPHEN 5-325 MG PO TABS Oral Take 2 tablets by mouth once. 20 tablet 0  . IBUPROFEN 600 MG PO TABS Oral Take 1 tablet (600 mg total) by mouth every 6 (six) hours as needed for pain. 30 tablet 0  . METRONIDAZOLE 500 MG PO TABS Oral Take 1 tablet (500 mg total) by mouth 2 (two) times daily. 14 tablet 0    BP 117/57  Pulse 79  Temp(Src) 98.3 F (36.8 C) (Oral)  Resp 16  SpO2 100%  Physical Exam  Constitutional: She is oriented to person, place, and time. She appears well-developed and well-nourished. No distress.  HENT:  Head: Normocephalic.  Nose: Nose  normal.  Eyes: EOM are normal.  Neck: Normal range of motion. Neck supple.  Cardiovascular: Normal rate, regular rhythm and intact distal pulses.   Pulmonary/Chest: Effort normal and breath sounds normal. No respiratory distress.  Abdominal: Soft. She exhibits no distension. There is tenderness (mild suprapubic w/o peritonitis). There is no rebound and no guarding.  Genitourinary: Pelvic exam was performed with patient prone. There is no lesion on the right labia. There is no lesion on the left labia. Cervix exhibits discharge (minimal white). Cervix exhibits no motion tenderness and no friability. Right  adnexum displays no tenderness. Left adnexum displays no tenderness.       Female staff member present as chaperone during entire exam.   Musculoskeletal: Normal range of motion. She exhibits no edema and no tenderness.  Neurological: She is alert and oriented to person, place, and time.       Normal strength  Skin: Skin is warm and dry. No rash noted. She is not diaphoretic.  Psychiatric: She has a normal mood and affect. Her behavior is normal. Thought content normal.    ED Course  Procedures (including critical care time)  Labs Reviewed  URINALYSIS, ROUTINE W REFLEX MICROSCOPIC - Abnormal; Notable for the following:    Leukocytes, UA TRACE (*)    All other components within normal limits  WET PREP, GENITAL - Abnormal; Notable for the following:    Clue Cells, Wet Prep FEW (*)    WBC, Wet Prep HPF POC FEW (*)    All other components within normal limits  URINE MICROSCOPIC-ADD ON - Abnormal; Notable for the following:    Squamous Epithelial / LPF FEW (*)    Bacteria, UA FEW (*)    All other components within normal limits  POCT PREGNANCY, URINE  GC/CHLAMYDIA PROBE AMP, GENITAL   No results found.   1. Lumbar strain   2. Pelvic pain in female   3. BV (bacterial vaginosis)       MDM  Back pain reproducible with palpation and involves lumbar paraspinal muscles.  Abdomen benign but with mild suprapubic ttp.  No s/s pyelonephritis.  Pt well hydrated and well appearing.  UA and pregnancy negative. Pelvic unremarkable but positive clue cells.  Clin pic c/w pelvic pain and back strain.  No evidence of emergent pelvic or intraabdominal etiology.  Return precautions discussed.        Milus Glazier 04/10/11 0008  Kathlene November Brydon Spahr 04/10/11 0010

## 2011-04-09 NOTE — ED Notes (Signed)
Patient reports she is having lower back pain and lower abd pain.  She describes a tightening sensation in her lower abd today to the point she could not walk.  She had period end of october

## 2011-04-09 NOTE — ED Notes (Signed)
Pt states that she has lower mid-back pain x 1 week and today she began experiencing lower abd cramping, cramping so bad that she had to leave work early.  Denies vaginal d/c, bleeding or abnormal smells.

## 2011-04-10 LAB — GC/CHLAMYDIA PROBE AMP, GENITAL
Chlamydia, DNA Probe: NEGATIVE
GC Probe Amp, Genital: NEGATIVE

## 2011-04-10 NOTE — ED Provider Notes (Signed)
I saw and evaluated the patient, reviewed the resident's note and I agree with the findings and plan.  Nicholes Stairs, MD 04/10/11 [phone]

## 2011-04-28 ENCOUNTER — Encounter (HOSPITAL_COMMUNITY): Payer: Self-pay | Admitting: *Deleted

## 2011-04-28 ENCOUNTER — Emergency Department (HOSPITAL_COMMUNITY)
Admission: EM | Admit: 2011-04-28 | Discharge: 2011-04-29 | Disposition: A | Discharge status: Home / Self Care | Payer: Medicaid Other | Attending: Emergency Medicine | Admitting: Emergency Medicine

## 2011-04-28 DIAGNOSIS — K219 Gastro-esophageal reflux disease without esophagitis: Secondary | ICD-10-CM | POA: Insufficient documentation

## 2011-04-28 DIAGNOSIS — R61 Generalized hyperhidrosis: Secondary | ICD-10-CM | POA: Insufficient documentation

## 2011-04-28 DIAGNOSIS — IMO0001 Reserved for inherently not codable concepts without codable children: Secondary | ICD-10-CM | POA: Insufficient documentation

## 2011-04-28 DIAGNOSIS — J069 Acute upper respiratory infection, unspecified: Secondary | ICD-10-CM | POA: Insufficient documentation

## 2011-04-28 DIAGNOSIS — J45909 Unspecified asthma, uncomplicated: Secondary | ICD-10-CM | POA: Insufficient documentation

## 2011-04-28 DIAGNOSIS — R509 Fever, unspecified: Secondary | ICD-10-CM | POA: Insufficient documentation

## 2011-04-28 DIAGNOSIS — J3489 Other specified disorders of nose and nasal sinuses: Secondary | ICD-10-CM | POA: Insufficient documentation

## 2011-04-28 DIAGNOSIS — R05 Cough: Secondary | ICD-10-CM | POA: Insufficient documentation

## 2011-04-28 DIAGNOSIS — R51 Headache: Secondary | ICD-10-CM | POA: Insufficient documentation

## 2011-04-28 DIAGNOSIS — R070 Pain in throat: Secondary | ICD-10-CM | POA: Insufficient documentation

## 2011-04-28 DIAGNOSIS — R059 Cough, unspecified: Secondary | ICD-10-CM | POA: Insufficient documentation

## 2011-04-28 HISTORY — DX: Gastro-esophageal reflux disease without esophagitis: K21.9

## 2011-04-28 HISTORY — DX: Other allergy status, other than to drugs and biological substances: Z91.09

## 2011-04-28 NOTE — ED Notes (Signed)
PT. AT PEDIATRIC ER WITH CHILD. WILL COME BACK TO ADULT TRIAGE WHEN POSSIBLE.

## 2011-04-28 NOTE — ED Notes (Signed)
Pt states she has body aches, pain, cough, yellow nasal drainage, not eating or drinking. Denies v/d.

## 2011-04-29 MED ORDER — IBUPROFEN 200 MG PO TABS
ORAL_TABLET | ORAL | Status: AC
  Filled 2011-04-29: qty 3

## 2011-04-29 MED ORDER — ACETAMINOPHEN 325 MG PO TABS
ORAL_TABLET | ORAL | Status: AC
  Filled 2011-04-29: qty 2

## 2011-04-29 MED ORDER — IBUPROFEN 200 MG PO TABS
600.0000 mg | ORAL_TABLET | Freq: Once | ORAL | Status: AC
  Administered 2011-04-29: 600 mg via ORAL

## 2011-04-29 MED ORDER — OXYMETAZOLINE HCL 0.05 % NA SOLN
1.0000 | Freq: Once | NASAL | Status: AC
  Administered 2011-04-29: 1 via NASAL
  Filled 2011-04-29: qty 15

## 2011-04-29 MED ORDER — OXYMETAZOLINE HCL 0.05 % NA SOLN: 2.0000 | Freq: Two times a day (BID) | NASAL | Status: AC

## 2011-04-29 MED ORDER — ACETAMINOPHEN 325 MG PO TABS
650.0000 mg | ORAL_TABLET | Freq: Once | ORAL | Status: AC
  Administered 2011-04-29: 650 mg via ORAL

## 2011-04-29 MED ORDER — ACETAMINOPHEN-CODEINE 120-12 MG/5ML PO SUSP: 5.0000 mL | Freq: Four times a day (QID) | ORAL | Status: AC | PRN

## 2011-04-29 NOTE — ED Provider Notes (Signed)
History     CSN: [id number] Arrival date & time: 04/28/2011  9:11 PM   First MD Initiated Contact with Patient 04/29/11 0114      Chief Complaint  Patient presents with  . Fever    (Consider location/radiation/quality/duration/timing/severity/associated sxs/prior treatment) Patient is a 25 y.o. female presenting with fever. The history is provided by the patient.  Fever Primary symptoms of the febrile illness include fever and cough. Primary symptoms do not include headaches, wheezing, shortness of breath, abdominal pain, dysuria or rash. The current episode started 3 to 5 days ago. This is a new problem. The problem has been gradually worsening.  The fever began yesterday. The maximum temperature recorded prior to her arrival was unknown. Temperature source: Subjective with chills and sweats.  Associated with: 2 kids at home sick with similar symptoms. Risk factors: No diabetes or recent travel.  onset a few days ago and worse today. Has body aches that she describes as dull aches. Has associated sore throat, dry cough, congestion and mild headache. No rash or neck stiffness no photophobia. Moderate in severity. Has not taken anything at home for this other than her prescribed medications with no relief.  Past Medical History  Diagnosis Date  . Asthma   . Migraine   . Reflux   . Anxiety   . Anxiety   . Environmental allergies   . Acid reflux     History reviewed. No pertinent past surgical history.  History reviewed. No pertinent family history.  History  Substance Use Topics  . Smoking status: Never Smoker   . Smokeless tobacco: Not on file  . Alcohol Use: No    OB History    Grav Para Term Preterm Abortions TAB SAB Ect Mult Living                  Review of Systems  Constitutional: Positive for fever. Negative for chills.  HENT: Positive for congestion and sore throat. Negative for ear pain, mouth sores, trouble swallowing, neck pain and neck stiffness.   Eyes:  Negative for pain.  Respiratory: Positive for cough. Negative for shortness of breath, wheezing and stridor.   Cardiovascular: Negative for chest pain and palpitations.  Gastrointestinal: Negative for abdominal pain.  Genitourinary: Negative for dysuria.  Musculoskeletal: Negative for back pain.  Skin: Negative for rash.  Neurological: Negative for headaches.  All other systems reviewed and are negative.    Allergies  Latex and Zofran  Home Medications   Current Outpatient Rx  Name Route Sig Dispense Refill  . ALBUTEROL SULFATE HFA 108 (90 BASE) MCG/ACT IN AERS Inhalation Inhale 2 puffs into the lungs every 6 (six) hours as needed. For shortness of breath     . NAPROXEN SODIUM 275 MG PO TABS Oral Take 275 mg by mouth every 8 (eight) hours as needed. For pain     . TRAMADOL HCL 50 MG PO TABS Oral Take 50 mg by mouth every 8 (eight) hours as needed. For pain Maximum dose= 8 tablets per day       BP 115/75  Pulse 95  Temp(Src) 98.7 F (37.1 C) (Oral)  Resp 18  SpO2 100%  LMP 04/11/2011  Physical Exam  Constitutional: She is oriented to person, place, and time. She appears well-developed and well-nourished.  HENT:  Head: Normocephalic and atraumatic.  Mouth/Throat: No oropharyngeal exudate.       Nasal congestion  Eyes: Conjunctivae and EOM are normal. Pupils are equal, round, and reactive to light.  Neck: Trachea normal. Neck supple. No thyromegaly present.       No nuchal rigidity  Cardiovascular: Normal rate, regular rhythm, S1 normal, S2 normal and normal pulses.     No systolic murmur is present   No diastolic murmur is present  Pulses:      Radial pulses are 2+ on the right side, and 2+ on the left side.  Pulmonary/Chest: Effort normal and breath sounds normal. She has no wheezes. She has no rhonchi. She has no rales. She exhibits no tenderness.  Abdominal: Soft. Normal appearance and bowel sounds are normal. There is no tenderness. There is no CVA tenderness and  negative Murphy's sign.  Musculoskeletal:       BLE:s Calves nontender, no cords or erythema, negative Homans sign  Neurological: She is alert and oriented to person, place, and time. She has normal strength. No cranial nerve deficit or sensory deficit. GCS eye subscore is 4. GCS verbal subscore is 5. GCS motor subscore is 6.  Skin: Skin is warm and dry. No rash noted. She is not diaphoretic.  Psychiatric: Her speech is normal.       Cooperative and appropriate    ED Course  Procedures (including critical care time)  Tylenol, Motrin, Afrin for symptoms. Tolerates water without clinical dehydration  Pulse ox 100% on room air is adequate   MDM   Clinical presentation suggests upper respiratory infection likely viral given multiple sick contacts. Viral precautions verbalized is understood and patient has primary care followup as needed. Work note provided.        Sunnie Nielsen, MD 04/29/11 [phone]

## 2011-05-21 ENCOUNTER — Inpatient Hospital Stay (HOSPITAL_COMMUNITY): Payer: Medicaid Other

## 2011-05-21 ENCOUNTER — Encounter (HOSPITAL_COMMUNITY): Payer: Self-pay

## 2011-05-21 ENCOUNTER — Inpatient Hospital Stay (HOSPITAL_COMMUNITY)
Admission: AD | Admit: 2011-05-21 | Discharge: 2011-05-21 | Disposition: A | Discharge status: Home / Self Care | Payer: Medicaid Other | Source: Ambulatory Visit | Attending: Obstetrics | Admitting: Obstetrics

## 2011-05-21 DIAGNOSIS — Z3480 Encounter for supervision of other normal pregnancy, unspecified trimester: Secondary | ICD-10-CM

## 2011-05-21 DIAGNOSIS — O99891 Other specified diseases and conditions complicating pregnancy: Secondary | ICD-10-CM | POA: Insufficient documentation

## 2011-05-21 DIAGNOSIS — R109 Unspecified abdominal pain: Secondary | ICD-10-CM | POA: Insufficient documentation

## 2011-05-21 DIAGNOSIS — N390 Urinary tract infection, site not specified: Secondary | ICD-10-CM

## 2011-05-21 LAB — CBC
HCT: 34.7 % — ABNORMAL LOW (ref 36.0–46.0)
Hemoglobin: 11.5 g/dL — ABNORMAL LOW (ref 12.0–15.0)
MCH: 27.1 pg (ref 26.0–34.0)
MCHC: 33.1 g/dL (ref 30.0–36.0)
RDW: 13.3 % (ref 11.5–15.5)

## 2011-05-21 LAB — URINE MICROSCOPIC-ADD ON

## 2011-05-21 LAB — POCT PREGNANCY, URINE: Preg Test, Ur: POSITIVE

## 2011-05-21 LAB — URINALYSIS, ROUTINE W REFLEX MICROSCOPIC
Glucose, UA: NEGATIVE mg/dL
Protein, ur: NEGATIVE mg/dL
Specific Gravity, Urine: >1.03 — ABNORMAL HIGH (ref 1.005–1.030)
pH: 6 (ref 5.0–8.0)

## 2011-05-21 LAB — DIFFERENTIAL
Basophils Absolute: 0 10*3/uL (ref 0.0–0.1)
Basophils Relative: 0 % (ref 0–1)
Eosinophils Absolute: 0.2 10*3/uL (ref 0.0–0.7)
Monocytes Absolute: 0.8 10*3/uL (ref 0.1–1.0)
Monocytes Relative: 11 % (ref 3–12)
Neutro Abs: 3.6 10*3/uL (ref 1.7–7.7)
Neutrophils Relative %: 51 % (ref 43–77)

## 2011-05-21 LAB — WET PREP, GENITAL
Clue Cells Wet Prep HPF POC: NONE SEEN
Trich, Wet Prep: NONE SEEN

## 2011-05-21 LAB — HCG, QUANTITATIVE, PREGNANCY: hCG, Beta Chain, Quant, S: 3740 m[IU]/mL — ABNORMAL HIGH (ref <5)

## 2011-05-21 MED ORDER — PRENATAL RX 60-1 MG PO TABS: 1.0000 | ORAL_TABLET | Freq: Every day | ORAL | Status: DC

## 2011-05-21 NOTE — ED Provider Notes (Signed)
History     Chief Complaint  Patient presents with  . Abdominal Cramping   HPITamika Ericka Reed is 26 y.o. [id number] [redacted]w[redacted]d weeks presenting with lower abdominal pain and backache for 1 weeks.  Went by Dr. Elsie Stain office today and told to come here for evaluation.  She was told to get a paper confirming pregnancy and blood test.      Past Medical History  Diagnosis Date  . Asthma   . Migraine   . Reflux   . Anxiety   . Anxiety   . Environmental allergies   . Acid reflux     Past Surgical History  Procedure Date  . No past surgeries     Family History  Problem Relation Age of Onset  . Hypotension Neg Hx   . Anesthesia problems Neg Hx   . Malignant hyperthermia Neg Hx   . Pseudochol deficiency Neg Hx     History  Substance Use Topics  . Smoking status: Never Smoker   . Smokeless tobacco: Not on file  . Alcohol Use: No    Allergies:  Allergies  Allergen Reactions  . Latex Itching  . Zofran Itching    Prescriptions prior to admission  Medication Sig Dispense Refill  . albuterol (PROVENTIL HFA;VENTOLIN HFA) 108 (90 BASE) MCG/ACT inhaler Inhale 2 puffs into the lungs every 6 (six) hours as needed. For shortness of breath         Review of Systems  Constitutional: Negative.   HENT: Negative.   Gastrointestinal: Positive for nausea, vomiting and abdominal pain.  Genitourinary:       Negative for vaginal discharge or bleeding   Physical Exam   Blood pressure 116/70, pulse 108, temperature 98.5 F (36.9 C), temperature source Oral, resp. rate 16, height 5\' 2"  (1.575 m), weight 195 lb 9.6 oz (88.724 kg), last menstrual period 04/13/2011.  Physical Exam  Constitutional: She is oriented to person, place, and time. She appears well-developed and well-nourished.  HENT:  Head: Normocephalic.  Neck: Normal range of motion.  GI: Soft. She exhibits no distension and no mass. There is no tenderness. There is no rebound and no guarding.  Genitourinary: Uterus is  tender (mild). Right adnexum displays no mass, no tenderness and no fullness. Left adnexum displays no mass, no tenderness and no fullness. No bleeding around the vagina. Vaginal discharge (small amount of frothy discharge) found.       Negative for CVA tenderness  Neurological: She is alert and oriented to person, place, and time.  Skin: Skin is warm and dry.  Psychiatric: She has a normal mood and affect.  Intrauterine gestational sac: Visualized/normal in shape.  Yolk sac: Present  Embryo: Not visualized  MSD: 4.3 mm, corresponding to an estimated gestational age [redacted] weeks  0 days.  [place] EDC: 01/21/2012  Maternal uterus/adnexae:  No subchorionic hemorrhage.  Right ovary measures 3.3 x 2.1 x 2.3 cm.  Left ovary measures 1.4 x 2.0 x 1.7 cm.  Trace free fluid.  IMPRESSION:  Single intrauterine gestation with estimated gestational age [redacted]  weeks 0 days. Yolk sac but no fetal pole visualized. Results for orders placed during the hospital encounter of 05/21/11 (from the past 24 hour(s))  URINALYSIS, ROUTINE W REFLEX MICROSCOPIC     Status: Abnormal   Collection Time   05/21/11  4:05 PM      Component Value Range   Color, Urine YELLOW  YELLOW    APPearance HAZY (*) CLEAR    Specific Gravity, Urine >1.030 (*)  1.005 - 1.030    pH 6.0  5.0 - 8.0    Glucose, UA NEGATIVE  NEGATIVE (mg/dL)   Hgb urine dipstick SMALL (*) NEGATIVE    Bilirubin Urine NEGATIVE  NEGATIVE    Ketones, ur 15 (*) NEGATIVE (mg/dL)   Protein, ur NEGATIVE  NEGATIVE (mg/dL)   Urobilinogen, UA 1.0  0.0 - 1.0 (mg/dL)   Nitrite NEGATIVE  NEGATIVE    Leukocytes, UA SMALL (*) NEGATIVE   URINE MICROSCOPIC-ADD ON     Status: Abnormal   Collection Time   05/21/11  4:05 PM      Component Value Range   Squamous Epithelial / LPF MANY (*) RARE    WBC, UA 0-2  <3 (WBC/hpf)   RBC / HPF 0-2  <3 (RBC/hpf)   Bacteria, UA MANY (*) RARE   POCT PREGNANCY, URINE     Status: Normal   Collection Time   05/21/11  4:16 PM      Component Value  Range   Preg Test, Ur POSITIVE    HCG, QUANTITATIVE, PREGNANCY     Status: Abnormal   Collection Time   05/21/11  5:17 PM      Component Value Range   hCG, Beta Chain, Quant, S 3740 (*) <5 (mIU/mL)  CBC     Status: Abnormal   Collection Time   05/21/11  5:17 PM      Component Value Range   WBC 6.9  4.0 - 10.5 (K/uL)   RBC 4.25  3.87 - 5.11 (MIL/uL)   Hemoglobin 11.5 (*) 12.0 - 15.0 (g/dL)   HCT 45.4 (*) 09.8 - 46.0 (%)   MCV 81.6  78.0 - 100.0 (fL)   MCH 27.1  26.0 - 34.0 (pg)   MCHC 33.1  30.0 - 36.0 (g/dL)   RDW 11.9  14.7 - 82.9 (%)   Platelets 266  150 - 400 (K/uL)  DIFFERENTIAL     Status: Normal   Collection Time   05/21/11  5:17 PM      Component Value Range   Neutrophils Relative 51  43 - 77 (%)   Neutro Abs 3.6  1.7 - 7.7 (K/uL)   Lymphocytes Relative 35  12 - 46 (%)   Lymphs Abs 2.4  0.7 - 4.0 (K/uL)   Monocytes Relative 11  3 - 12 (%)   Monocytes Absolute 0.8  0.1 - 1.0 (K/uL)   Eosinophils Relative 3  0 - 5 (%)   Eosinophils Absolute 0.2  0.0 - 0.7 (K/uL)   Basophils Relative 0  0 - 1 (%)   Basophils Absolute 0.0  0.0 - 0.1 (K/uL)  WET PREP, GENITAL     Status: Abnormal   Collection Time   05/21/11  5:22 PM      Component Value Range   Yeast, Wet Prep NONE SEEN  NONE SEEN    Trich, Wet Prep NONE SEEN  NONE SEEN    Clue Cells, Wet Prep NONE SEEN  NONE SEEN    WBC, Wet Prep HPF POC MODERATE (*) NONE SEEN      MAU Course  Procedures  GC/CHL culture to lab  MDM   Blood type from previous record  A POSITIVE  18:30 Care turned over to [facility]. Mayford Knife, CNM   Assessment and Plan  Urine sent for culture. Will followup with Dr Gaynell Face when starts care  KEY,EVE M 05/21/2011, 5:08 PM   Matt Holmes, NP 05/21/11 5621

## 2011-05-21 NOTE — Progress Notes (Signed)
Patient presents with pain in mid to lower back and pelvic bones are hurting onset yesterday,  LMP 04/13/11 took UPT positive yesterday.

## 2011-05-22 LAB — GC/CHLAMYDIA PROBE AMP, GENITAL
Chlamydia, DNA Probe: NEGATIVE
GC Probe Amp, Genital: NEGATIVE

## 2011-05-24 LAB — URINE CULTURE

## 2011-05-25 MED ORDER — PENICILLIN V POTASSIUM 250 MG PO TABS: 250.0000 mg | ORAL_TABLET | Freq: Four times a day (QID) | ORAL | Status: DC

## 2011-06-15 ENCOUNTER — Inpatient Hospital Stay (HOSPITAL_COMMUNITY)
Admission: AD | Admit: 2011-06-15 | Discharge: 2011-06-15 | Disposition: A | Discharge status: Home / Self Care | Payer: Medicaid Other | Source: Ambulatory Visit | Attending: Obstetrics | Admitting: Obstetrics

## 2011-06-15 ENCOUNTER — Encounter (HOSPITAL_COMMUNITY): Payer: Self-pay | Admitting: *Deleted

## 2011-06-15 DIAGNOSIS — O200 Threatened abortion: Secondary | ICD-10-CM

## 2011-06-15 DIAGNOSIS — O26899 Other specified pregnancy related conditions, unspecified trimester: Secondary | ICD-10-CM

## 2011-06-15 DIAGNOSIS — O209 Hemorrhage in early pregnancy, unspecified: Secondary | ICD-10-CM

## 2011-06-15 DIAGNOSIS — O99891 Other specified diseases and conditions complicating pregnancy: Secondary | ICD-10-CM | POA: Insufficient documentation

## 2011-06-15 DIAGNOSIS — M549 Dorsalgia, unspecified: Secondary | ICD-10-CM

## 2011-06-15 DIAGNOSIS — R51 Headache: Secondary | ICD-10-CM

## 2011-06-15 HISTORY — DX: Urinary tract infection, site not specified: N39.0

## 2011-06-15 HISTORY — DX: Chlamydial infection, unspecified: A74.9

## 2011-06-15 HISTORY — DX: Major depressive disorder, single episode, unspecified: F32.9

## 2011-06-15 HISTORY — DX: Unspecified ovarian cyst, unspecified side: N83.209

## 2011-06-15 LAB — URINALYSIS, ROUTINE W REFLEX MICROSCOPIC
Bilirubin Urine: NEGATIVE
Nitrite: NEGATIVE
Protein, ur: NEGATIVE mg/dL
Specific Gravity, Urine: >1.03 — ABNORMAL HIGH (ref 1.005–1.030)
Urobilinogen, UA: 1 mg/dL (ref 0.0–1.0)

## 2011-06-15 MED ORDER — ACETAMINOPHEN-CODEINE #3 300-30 MG PO TABS
1.0000 | ORAL_TABLET | Freq: Once | ORAL | Status: AC
  Administered 2011-06-15: 1 via ORAL
  Filled 2011-06-15: qty 1

## 2011-06-15 MED ORDER — ACETAMINOPHEN-CODEINE #3 300-30 MG PO TABS: 1.0000 | ORAL_TABLET | Freq: Four times a day (QID) | ORAL | Status: AC | PRN

## 2011-06-15 NOTE — Progress Notes (Signed)
Pt states headache x3 weeks, has not taken any medications. Also notes medial low back pain x1week. Back pain intermittent, mostly felt in morning. Denies any bleeding or vaginal d/c changes.

## 2011-06-15 NOTE — ED Provider Notes (Signed)
History     Chief Complaint  Patient presents with  . Headache  . Back Pain   HPI  Laurie Reed is 26 y.o. [id number] [redacted]w[redacted]d weeks presenting with headache and back pain.  Has sharp pains in her lower to mid back for 1 weeks. Headaches for 3 weeks, wakes up with them.  Has history of migraines.  Has not taken anything for her headaches.  Her daughter has been sick for 1 week and she has been out of work.  Denies vaginal bleeding or discharge.  Patient of Dr. Elsie Stain has first prenatal appointment on 2/11.  Denies nausea and vomiting.    Past Medical History  Diagnosis Date  . Asthma   . Migraine   . Reflux   . Anxiety   . Anxiety   . Environmental allergies   . Acid reflux   . Gestational diabetes   . Hypertension   . Urinary tract infection   . Depression     no meds, currenly ok  . Ovarian cyst   . Chlamydia     Past Surgical History  Procedure Date  . No past surgeries     Family History  Problem Relation Age of Onset  . Hypotension Neg Hx   . Anesthesia problems Neg Hx   . Malignant hyperthermia Neg Hx   . Pseudochol deficiency Neg Hx   . Asthma Mother   . Heart disease Mother   . Diabetes Mother   . Hypertension Mother   . Asthma Maternal Aunt   . Hypertension Maternal Aunt   . Diabetes Maternal Aunt   . Asthma Maternal Uncle   . Hypertension Maternal Uncle   . Diabetes Maternal Uncle   . Heart disease Maternal Grandmother   . Diabetes Maternal Grandmother   . Hypertension Maternal Grandmother   . Heart disease Maternal Grandfather   . Diabetes Maternal Grandfather   . Hypertension Maternal Grandfather     History  Substance Use Topics  . Smoking status: Never Smoker   . Smokeless tobacco: Not on file  . Alcohol Use: No    Allergies:  Allergies  Allergen Reactions  . Latex Itching  . Zofran Itching    Prescriptions prior to admission  Medication Sig Dispense Refill  . albuterol (PROVENTIL HFA;VENTOLIN HFA) 108 (90 BASE) MCG/ACT  inhaler Inhale 2 puffs into the lungs every 6 (six) hours as needed. For shortness of breath       . oxymetazoline (AFRIN) 0.05 % nasal spray Place 2 sprays into the nose 2 (two) times daily as needed. For congestion.        Review of Systems  Constitutional: Negative.   Gastrointestinal: Negative.  Negative for nausea, vomiting and abdominal pain.  Genitourinary:       Neg for vaginal bleeding  Musculoskeletal: Positive for back pain.  Neurological: Positive for headaches.   Physical Exam   Blood pressure 106/62, pulse 71, temperature 98.5 F (36.9 C), temperature source Oral, resp. rate 16, height 5\' 2"  (1.575 m), weight 199 lb 8 oz (90.493 kg), last menstrual period 04/13/2011, unknown if currently breastfeeding.  Physical Exam  Constitutional: She is oriented to person, place, and time. Vital signs are normal. She appears well-developed and well-nourished. No distress.  GI: She exhibits no mass. There is no tenderness. There is no rebound and no guarding.  Musculoskeletal:       Lower back pain.  Negative for flank pain bilaterally  Neurological: She is alert and oriented to person, place, and  time. No cranial nerve deficit.  Skin: Skin is warm and dry.   Results for orders placed during the hospital encounter of 06/15/11 (from the past 24 hour(s))  URINALYSIS, ROUTINE W REFLEX MICROSCOPIC     Status: Abnormal   Collection Time   06/15/11  8:33 AM      Component Value Range   Color, Urine YELLOW  YELLOW    APPearance CLEAR  CLEAR    Specific Gravity, Urine >1.030 (*) 1.005 - 1.030    pH 6.0  5.0 - 8.0    Glucose, UA NEGATIVE  NEGATIVE (mg/dL)   Hgb urine dipstick TRACE (*) NEGATIVE    Bilirubin Urine NEGATIVE  NEGATIVE    Ketones, ur NEGATIVE  NEGATIVE (mg/dL)   Protein, ur NEGATIVE  NEGATIVE (mg/dL)   Urobilinogen, UA 1.0  0.0 - 1.0 (mg/dL)   Nitrite NEGATIVE  NEGATIVE    Leukocytes, UA NEGATIVE  NEGATIVE   URINE MICROSCOPIC-ADD ON     Status: Normal   Collection Time     06/15/11  8:33 AM      Component Value Range   Squamous Epithelial / LPF RARE  RARE    RBC / HPF 0-2  <3 (RBC/hpf)   Urine-Other MUCOUS PRESENT      MAU Course  Procedures  MDM Tylenol #3 1 tab po given for headache--she states her pain is less after medication.    Assessment and Plan  A:  [redacted] weeks gestation with headache and back pain      Neg UA    Hx of migraines  P:  Rx for Tylenol #3      Out of work today     Keep scheduled appt with Dr. Gaynell Face.    Call Dr. Gaynell Face if headaches not controlled with medication or worsen.  Izaih Kataoka,EVE M 06/15/2011, 9:34 AM   Matt Holmes, NP 06/15/11 1106

## 2011-06-15 NOTE — Progress Notes (Signed)
Hx of headaches/migraines.  Had not been having them for awhile.  Past 3 wks has had daily headaches, when gets up head spins and has been seeing spots.  Has not taken anything.

## 2011-06-28 ENCOUNTER — Encounter (HOSPITAL_COMMUNITY): Payer: Self-pay | Admitting: *Deleted

## 2011-06-28 ENCOUNTER — Inpatient Hospital Stay (HOSPITAL_COMMUNITY): Payer: Medicaid Other

## 2011-06-28 ENCOUNTER — Other Ambulatory Visit (HOSPITAL_COMMUNITY): Payer: Self-pay | Admitting: Obstetrics

## 2011-06-28 ENCOUNTER — Inpatient Hospital Stay (HOSPITAL_COMMUNITY)
Admission: AD | Admit: 2011-06-28 | Discharge: 2011-06-28 | Disposition: A | Discharge status: Home / Self Care | Payer: Medicaid Other | Source: Ambulatory Visit | Attending: Obstetrics | Admitting: Obstetrics

## 2011-06-28 DIAGNOSIS — O200 Threatened abortion: Secondary | ICD-10-CM

## 2011-06-28 DIAGNOSIS — O209 Hemorrhage in early pregnancy, unspecified: Secondary | ICD-10-CM

## 2011-06-28 DIAGNOSIS — Z364 Encounter for antenatal screening for fetal growth retardation: Secondary | ICD-10-CM

## 2011-06-28 NOTE — Discharge Instructions (Signed)
Threatened Miscarriage Bleeding during the first 20 weeks of pregnancy is common. This is sometimes called a threatened miscarriage. This is a pregnancy that is threatening to end before the twentieth week of pregnancy. Often this bleeding stops with bed rest or decreased activities as suggested by your caregiver and the pregnancy continues without any more problems. You may be asked to not have sexual intercourse, have orgasms or use tampons until further notice. Sometimes a threatened miscarriage can progress to a complete or incomplete miscarriage. This may or may not require further treatment. Some miscarriages occur before a woman misses a menstrual period and knows she is pregnant. Miscarriages occur in 15 to 20% of all pregnancies and usually occur during the first 13 weeks of the pregnancy. The exact cause of a miscarriage is usually never known. A miscarriage is natures way of ending a pregnancy that is abnormal or would not make it to term. There are some things that may put you at risk to have a miscarriage, such as:  Hormone problems.   Infection of the uterus or cervix.   Chronic illness, diabetes for example, especially if it is not controlled.   Abnormal shaped uterus.   Fibroids in the uterus.   Incompetent cervix (the cervix is too weak to hold the baby).   Smoking.   Drinking too much alcohol. It's best not to drink any alcohol when you are pregnant.   Taking illegal drugs.  TREATMENT  When a miscarriage becomes complete and all products of conception (all the tissue in the uterus) have been passed, often no treatment is needed. If you think you passed tissue, save it in a container and take it to your doctor for evaluation. If the miscarriage is incomplete (parts of the fetus or placenta remain in the uterus), further treatment may be needed. The most common reason for further treatment is continued bleeding (hemorrhage) because pregnancy tissue did not pass out of the  uterus. This often occurs if a miscarriage is incomplete. Tissue left behind may also become infected. Treatment usually is dilatation and curettage (the removal of the remaining products of pregnancy. This can be done by a simple sucking procedure (suction curettage) or a simple scraping of the inside of the uterus. This may be done in the hospital or in the caregiver's office. This is only done when your caregiver knows that there is no chance for the pregnancy to proceed to term. This is determined by physical examination, negative pregnancy test, falling pregnancy hormone count and/or, an ultrasound revealing a dead fetus. Miscarriages are often a very emotional time for prospective mothers and fathers. This is not you or your partners fault. It did not occur because of an inadequacy in you or your partner. Nearly all miscarriages occur because the pregnancy has started off wrongly. At least half of these pregnancies have a chromosomal abnormality. It is almost always not inherited. Others may have developmental problems with the fetus or placenta. This does not always show up even when the products miscarried are studied under the microscope. The miscarriage is nearly always not your fault and it is not likely that you could have prevented it from happening. If you are having emotional and grieving problems, talk to your health care provider and even seek counseling, if necessary, before getting pregnant again. You can begin trying for another pregnancy as soon as your caregiver says it is OK. HOME CARE INSTRUCTIONS   Your caregiver may order bed rest depending on how much bleeding   and cramping you are having. You may be limited to only getting up to go to the bathroom. You may be allowed to continue light activity. You may need to make arrangements for the care of your other children and for any other responsibilities.   Keep track of the number of pads you use each day, how often you have to change pads  and how saturated (soaked) they are. Record this information.   DO NOT USE TAMPONS. Do not douche, have sexual intercourse or orgasms until approved by your caregiver.   You may receive a follow up appointment for re-evaluation of your pregnancy and a repeat blood test. Re-evaluation often occurs after 2 days and again in 4 to 6 weeks. It is very important that you follow-up in the recommended time period.   If you are Rh negative and the father is Rh positive or you do not know the fathers' blood type, you may receive a shot (Rh immune globulin) to help prevent abnormal antibodies that can develop and affect the baby in any future pregnancies.  SEEK IMMEDIATE MEDICAL CARE IF:  You have severe cramps in your stomach, back, or abdomen.   You have a sudden onset of severe pain in the lower part of your abdomen.   You develop chills.   You run an unexplained temperature of 101 F (38.3 C) or higher.   You pass large clots or tissue. Save any tissue for your caregiver to inspect.   Your bleeding increases or you become light-headed, weak, or have fainting episodes.   You have a gush of fluid from your vagina.   You pass out. This could mean you have a tubal (ectopic) pregnancy.  Document Released: 05/03/2005 Document Revised: 01/13/2011 Document Reviewed: 12/18/2007 [employer] Patient Information 2012 [employer]. 

## 2011-06-28 NOTE — ED Provider Notes (Signed)
History   Pt presents today c/o worsening vag bleeding. She was seen earlier today by Dr. Gaynell Face and had a Pap and STD check during her first prenatal visit. She states she became concerned because she was bleeding heavier after her  Visit. She denies fever, dysuria, or any other sx at this time.   Chief Complaint  Patient presents with  . Vaginal Bleeding   HPI  OB History    Grav Para Term Preterm Abortions TAB SAB Ect Mult Living   4 2 2  0 1 0 1 0 0 2      Past Medical History  Diagnosis Date  . Asthma   . Migraine   . Reflux   . Anxiety   . Anxiety   . Environmental allergies   . Acid reflux   . Hypertension   . Urinary tract infection   . Depression     no meds, currenly ok  . Ovarian cyst   . Chlamydia   . Gestational diabetes     Past Surgical History  Procedure Date  . No past surgeries     Family History  Problem Relation Age of Onset  . Hypotension Neg Hx   . Anesthesia problems Neg Hx   . Malignant hyperthermia Neg Hx   . Pseudochol deficiency Neg Hx   . Asthma Mother   . Heart disease Mother   . Diabetes Mother   . Hypertension Mother   . Asthma Maternal Aunt   . Hypertension Maternal Aunt   . Diabetes Maternal Aunt   . Asthma Maternal Uncle   . Hypertension Maternal Uncle   . Diabetes Maternal Uncle   . Heart disease Maternal Grandmother   . Diabetes Maternal Grandmother   . Hypertension Maternal Grandmother   . Heart disease Maternal Grandfather   . Diabetes Maternal Grandfather   . Hypertension Maternal Grandfather     History  Substance Use Topics  . Smoking status: Never Smoker   . Smokeless tobacco: Not on file  . Alcohol Use: No    Allergies:  Allergies  Allergen Reactions  . Latex Itching  . Zofran Itching    Prescriptions prior to admission  Medication Sig Dispense Refill  . albuterol (PROVENTIL HFA;VENTOLIN HFA) 108 (90 BASE) MCG/ACT inhaler Inhale 2 puffs into the lungs every 6 (six) hours as needed. For shortness  of breath       . Prenatal Vit-Fe Fumarate-FA (PRENATAL MULTIVITAMIN) TABS Take 1 tablet by mouth daily.        Review of Systems  Constitutional: Negative for fever.  Eyes: Negative for blurred vision and double vision.  Cardiovascular: Negative for chest pain.  Gastrointestinal: Positive for abdominal pain. Negative for nausea, vomiting, diarrhea and constipation.  Genitourinary: Negative for dysuria, urgency, frequency and hematuria.  Neurological: Negative for dizziness and headaches.  Psychiatric/Behavioral: Negative for depression and suicidal ideas.   Physical Exam   Blood pressure 123/70, pulse 80, temperature 98.4 F (36.9 C), temperature source Oral, resp. rate 16, height 5\' 2"  (1.575 m), weight 202 lb 6 oz (91.797 kg), last menstrual period 04/11/2011, unknown if currently breastfeeding.  Physical Exam  Nursing note and vitals reviewed. Constitutional: She is oriented to person, place, and time. She appears well-developed and well-nourished. No distress.  HENT:  Head: Normocephalic and atraumatic.  Eyes: EOM are normal. Pupils are equal, round, and reactive to light.  GI: Soft. She exhibits no distension. There is no tenderness. There is no rebound and no guarding.  Neurological: She  is alert and oriented to person, place, and time.  Skin: Skin is warm and dry. She is not diaphoretic.  Psychiatric: She has a normal mood and affect. Her behavior is normal. Judgment and thought content normal.    MAU Course  Procedures  US Ob Comp Less 14 Wks  06/28/2011  *RADIOLOGY REPORT*  Clinical Data: First trimester pregnancy.  Bleeding.  OBSTETRIC <14 WK [place] AND TRANSVAGINAL OB US  Technique:  Both transabdominal and transvaginal ultrasound examinations were performed for complete evaluation of the gestation as well as the maternal uterus, adnexal regions, and pelvic cul-de-sac.  Transvaginal technique was performed to assess early pregnancy.  Comparison:  Pelvic ultrasound  05/21/2011.  Intrauterine gestational sac:  Single.  Normal in shape. Yolk sac: Present Embryo: Present Cardiac Activity: Not visualized.  MSD: 1.81  mm  CRL: 2.6   mm  5   w  6   d  Maternal uterus/adnexae: Normal.  No subchorionic hemorrhage is evident.  IMPRESSION:  1.  Interval growth of a mean sac diameter and now presents of a fetal pole. 2.  No heart beat is visualized.  While this could represent fetal demise, recommend repeat ultrasound at 7-10 days to confirm.  Original Report Authenticated By: Jamesetta Orleans. MATTERN, M.D.   US Ob Transvaginal  06/28/2011  *RADIOLOGY REPORT*  Clinical Data: First trimester pregnancy.  Bleeding.  OBSTETRIC <14 WK [place] AND TRANSVAGINAL OB US  Technique:  Both transabdominal and transvaginal ultrasound examinations were performed for complete evaluation of the gestation as well as the maternal uterus, adnexal regions, and pelvic cul-de-sac.  Transvaginal technique was performed to assess early pregnancy.  Comparison:  Pelvic ultrasound 05/21/2011.  Intrauterine gestational sac:  Single.  Normal in shape. Yolk sac: Present Embryo: Present Cardiac Activity: Not visualized.  MSD: 1.81  mm  CRL: 2.6   mm  5   w  6   d  Maternal uterus/adnexae: Normal.  No subchorionic hemorrhage is evident.  IMPRESSION:  1.  Interval growth of a mean sac diameter and now presents of a fetal pole. 2.  No heart beat is visualized.  While this could represent fetal demise, recommend repeat ultrasound at 7-10 days to confirm.  Original Report Authenticated By: Jamesetta Orleans. MATTERN, M.D.     Assessment and Plan  Bleeding in preg: threatened miscarriage. Discussed with pt at length. She has a f/u US scheduled with Dr. Gaynell Face. Discussed diet, activity, risks, and precautions.  Clinton Gallant. Avanish Cerullo III, DrHSc, MPAS, PA-C  06/28/2011, 10:47 PM   Henrietta Hoover, PA 06/28/11 2338

## 2011-06-28 NOTE — Progress Notes (Signed)
Pt had an appt today at Dr.Marsahll's office, had a pelvic exam and started spotting afterwards. Pt is not wearing a pad. C/o lower abdominal cramping.

## 2011-06-28 NOTE — Progress Notes (Signed)
Pt. Started bleeding yesterday, she hasn't had to wear a pad, but she has dark red bleeding when she wipes.  She was seen in Dr. Elsie Stain office today for her first prenatal visit.  She had a pap smear done in the office.

## 2011-06-29 ENCOUNTER — Encounter (HOSPITAL_COMMUNITY): Payer: Self-pay | Admitting: *Deleted

## 2011-06-29 ENCOUNTER — Inpatient Hospital Stay (HOSPITAL_COMMUNITY)
Admission: AD | Admit: 2011-06-29 | Discharge: 2011-06-29 | Disposition: A | Discharge status: Home / Self Care | Payer: Medicaid Other | Source: Ambulatory Visit | Attending: Obstetrics | Admitting: Obstetrics

## 2011-06-29 DIAGNOSIS — O200 Threatened abortion: Secondary | ICD-10-CM | POA: Insufficient documentation

## 2011-06-29 NOTE — ED Provider Notes (Signed)
History     No chief complaint on file.  HPI Laurie Reed is 26 y.o. [id number] [redacted]w[redacted]d weeks presenting with vaginal bleeding X 3 days.  Was seen in the office yesterday for pap smear in the office.  Heavier bleeding after the exam.  Came in last night.  Had ultrasound.  Today bleeding with clots.  Mild cramping in vaginal area and lower back.  Has appt for ultrasound and visit scheduled with Dr. Gaynell Face.      Past Medical History  Diagnosis Date  . Asthma   . Migraine   . Reflux   . Anxiety   . Anxiety   . Environmental allergies   . Acid reflux   . Hypertension   . Urinary tract infection   . Depression     no meds, currenly ok  . Ovarian cyst   . Chlamydia   . Gestational diabetes     Past Surgical History  Procedure Date  . No past surgeries     Family History  Problem Relation Age of Onset  . Hypotension Neg Hx   . Anesthesia problems Neg Hx   . Malignant hyperthermia Neg Hx   . Pseudochol deficiency Neg Hx   . Asthma Mother   . Heart disease Mother   . Diabetes Mother   . Hypertension Mother   . Asthma Maternal Aunt   . Hypertension Maternal Aunt   . Diabetes Maternal Aunt   . Asthma Maternal Uncle   . Hypertension Maternal Uncle   . Diabetes Maternal Uncle   . Heart disease Maternal Grandmother   . Diabetes Maternal Grandmother   . Hypertension Maternal Grandmother   . Heart disease Maternal Grandfather   . Diabetes Maternal Grandfather   . Hypertension Maternal Grandfather     History  Substance Use Topics  . Smoking status: Never Smoker   . Smokeless tobacco: Not on file  . Alcohol Use: No    Allergies:  Allergies  Allergen Reactions  . Latex Itching  . Zofran Itching    Prescriptions prior to admission  Medication Sig Dispense Refill  . albuterol (PROVENTIL HFA;VENTOLIN HFA) 108 (90 BASE) MCG/ACT inhaler Inhale 2 puffs into the lungs every 6 (six) hours as needed. For shortness of breath       . Prenatal Vit-Fe Fumarate-FA (PRENATAL  MULTIVITAMIN) TABS Take 1 tablet by mouth daily.        Review of Systems  Gastrointestinal: Positive for abdominal pain (mild cramping).  Genitourinary:       Vagina bleeding with clots  Musculoskeletal: Positive for back pain (low).   Physical Exam   Last menstrual period 04/11/2011.  Physical Exam  Constitutional: She is oriented to person, place, and time. She appears well-developed and well-nourished. No distress.  HENT:  Head: Normocephalic.  Neck: Normal range of motion.  Respiratory: Effort normal.  GI: Soft. There is no tenderness.  Genitourinary: Uterus is not enlarged and not tender. Cervix exhibits no discharge and no friability. Right adnexum displays no mass, no tenderness and no fullness. Left adnexum displays no mass, no tenderness and no fullness. There is bleeding (dark brown blood with a few clots) around the vagina. No tenderness around the vagina.  Neurological: She is alert and oriented to person, place, and time.  Skin: Skin is warm and dry.   BLOOD TYPE IS A Positive MAU Course  Procedures  MDM  ULTRASOUND on 05/21/11 showed single IUGS normal in shape.  YS presents.  [redacted]w[redacted]d.   Repeat Ultrasound  on 2.11 Interval growth, IUGS +YS and +FP  Cardiac activity not seen.  13w6dl   While this could represent fetal demise, recommend repeat u/s in 7-10day.  Per patient report, Dr. Gaynell Face has an ultrasound scheduled for her on 2/14 and then a followup exam in the office.  She has been seen by him this week to begin prenatal care.   Assessment and Plan  A:  Threatened abortion  P:  Keep appt for ultrasound this Thursday and the appointment to see Dr. Gaynell Face      Pelvic rest until bleeding stops.    [name],EVE M 06/29/2011, 5:53 PM   Matt Holmes, NP 06/29/11 1819

## 2011-06-29 NOTE — Progress Notes (Signed)
Some dark vaginal bleeding noted pt. Made aware of possibility if miscarriage.

## 2011-06-29 NOTE — Progress Notes (Signed)
Pt states she started having abdominal cramps over 1 month ago. Pt has been having a small amount of vaginal bleeding for 3 days. Pt states she see blood when she wipes and has seen some blood on a pad.

## 2011-06-29 NOTE — Discharge Instructions (Signed)
Vaginal Bleeding During Pregnancy A small amount of bleeding from the vagina can happen anytime during pregnancy. Be sure to tell your doctor about all vaginal bleeding.  HOME CARE  Get plenty of rest and sleep.   Count the number of pads you use each day. Do not use tampons.   Save any tissue you pass for your doctor to see.   Do not exercise   Do not do any heavy lifting.   Avoid going up and down stairs. If you must climb stairs, go slowly.   Do not have sex (intercourse) or orgasms until approved by your doctor.   Do not douche.   Only take medicine as told by your doctor. Do not take aspirin.   Eat healthy.   Always keep your follow-up appointments.  GET HELP RIGHT AWAY IF:   You feel the baby moving less or not moving at all.   The bleeding gets worse.   You have very painful cramps or pain in your stomach or back.   You pass large clots or anything that looks like tissue.   You have a temperature by mouth above 102 F (38.9 C).   You feel very weak.   You have chills.   You feel dizzy or pass out (faint).   You have a gush of fluid from the vagina.  MAKE SURE YOU:   Understand these instructions.   Will watch your condition.   Will get help right away if you are not doing well or get worse.  Document Released: 02/10/2008 Document Revised: 01/13/2011 Document Reviewed: 04/08/2009 [facility] Patient Information 2012 [place], [place].Threatened Miscarriage  A threatened miscarriage is a pregnancy that may end. It may be marked by bleeding during the first 20 weeks of pregnancy. Often, the pregnancy can continue without any more problems. You may be asked to stop:  Having sex (intercourse).   Having orgasms.   Using tampons.   Exercising.   Doing heavy physical activity and work.  HOME CARE   Your doctor may tell you to take bed rest and to stop activities and work.   Write down the number of pads you use each day. Write down how often you change  pads. Write down how soaked they are.   Follow your doctor's advice for follow-up visits and tests.   If your blood type is Rh-negative and the father's blood is Rh-positive (or is not known), you may get a shot to protect the baby.   If you have a miscarriage, save all the tissue you pass in a container. Take the container to your doctor.  GET HELP RIGHT AWAY IF:   You have bad cramps or pain in your belly (abdomen), lower belly, or back.   You have a fever or chills.   Your bleeding gets worse or you pass large clots of blood or tissue. Save this tissue to show your doctor.   You feel lightheaded, weak, dizzy, or pass out (faint).   You have a gush of fluid from your vagina.  MAKE SURE YOU:   Understand these instructions.   Will watch your condition.   Will get help right away if you are not doing well or get worse.  Document Released: 04/15/2008 Document Revised: 01/13/2011 Document Reviewed: 05/19/2009 [facility] Patient Information 2012 [place], [place].

## 2011-07-01 ENCOUNTER — Ambulatory Visit (HOSPITAL_COMMUNITY)
Admission: RE | Admit: 2011-07-01 | Discharge: 2011-07-01 | Disposition: A | Discharge status: Home / Self Care | Payer: Medicaid Other | Source: Ambulatory Visit | Attending: Obstetrics | Admitting: Obstetrics

## 2011-07-01 DIAGNOSIS — Z3689 Encounter for other specified antenatal screening: Secondary | ICD-10-CM | POA: Insufficient documentation

## 2011-07-01 DIAGNOSIS — O209 Hemorrhage in early pregnancy, unspecified: Secondary | ICD-10-CM | POA: Insufficient documentation

## 2011-07-01 DIAGNOSIS — Z364 Encounter for antenatal screening for fetal growth retardation: Secondary | ICD-10-CM

## 2011-07-02 ENCOUNTER — Inpatient Hospital Stay (HOSPITAL_COMMUNITY)
Admission: AD | Admit: 2011-07-02 | Discharge: 2011-07-02 | Disposition: A | Discharge status: Home / Self Care | Payer: Medicaid Other | Source: Ambulatory Visit | Attending: Obstetrics | Admitting: Obstetrics

## 2011-07-02 ENCOUNTER — Encounter (HOSPITAL_COMMUNITY): Payer: Self-pay | Admitting: *Deleted

## 2011-07-02 DIAGNOSIS — O034 Incomplete spontaneous abortion without complication: Secondary | ICD-10-CM | POA: Insufficient documentation

## 2011-07-02 LAB — CBC
HCT: 34.2 % — ABNORMAL LOW (ref 36.0–46.0)
Hemoglobin: 10.8 g/dL — ABNORMAL LOW (ref 12.0–15.0)
MCH: 26.2 pg (ref 26.0–34.0)
MCHC: 31.6 g/dL (ref 30.0–36.0)
MCV: 82.8 fL (ref 78.0–100.0)
RBC: 4.13 MIL/uL (ref 3.87–5.11)

## 2011-07-02 NOTE — Progress Notes (Signed)
PT SAYS HAD CRAMPING IN JAN.  BLEEDING STARTED ON SAT.  THEN ON Monday WENT TO OFFICE -    EXAM AND SCH U/S.  AND CAME  TO MAU    ON Tuesday - PASSED CLOTS - CAME  TO MAU.- SENT HOME..   THEN WED - STILL BLEEDING.   ON Thursday-  HAD U/S HERE-  DID NOT TELL ANYTHING ABOUT RESULTS- WENT HOME.     TONIGHT  AT 0300- WENT TO B-ROOM-   LARGE CLOT CAME OUT- PT BROUGHT WITH HER IN JAR.    STILL HAS CRAMPING.  HAS PAD ON - FEELS LIKE NL CYCLE.  HAS HAD TOTAL  4 U/S .

## 2011-07-02 NOTE — Discharge Instructions (Signed)
Incomplete Miscarriage Miscarriages in pregnancy are common. A miscarriage is a pregnancy that has ended before the twentieth week. You have had an incomplete miscarriage. Partial parts of the fetus or placenta (afterbirth) remain behind. Sometimes further treatment is needed. The most common reason for further treatment is continued bleeding (hemorrhage). Tissue left behind may also become infected. Treatment usually is curettage. Curettage for an incomplete abortion is a procedure in which the remaining products of pregnancy are removed. This can be done by a simple sucking procedure (suction curettage). It can also be done by a simple scraping (curettage) of the inside of the uterus (womb). This may be done in the hospital or in the caregiver's office. This is only done when your caregiver knows the pregnancy has ended. This is determined by physical examination and a negative pregnancy test. It may also include an ultrasound to confirm a dead fetus. The ultrasound may also prove that products of the pregnancy remain in the uterus. If your cervix remains dilated and you are still passing clots and tissue, your caregiver may wish to watch you for a little while. Your caregiver may want to see if you are going to finish passing all of the remaining parts of the pregnancy. If the bleeding continues, they may proceed with curettage. WHY DO I FEEL THIS WAY Miscarriages can be a very emotional time for prospective mothers. This is not you or your partner's fault. The miscarriage did not occur because of a lack in you or your partner. Nearly all miscarriages occur because the pregnancy has started off wrongly. At least half of miscarried pregnancies have a chromosomal abnormality (almost always not inherited). Others may have developmental problems with the fetus or placentas. Problems may not show up even when the products miscarried are studied under the microscope. You can usually begin trying for another  pregnancy as soon as your caregiver says it's okay. HOME CARE INSTRUCTIONS   Your caregiver may order bed rest (this means only getting up to use the bathroom). Your caregiver may allow you to continue light activity. If curettage was not done at this time, but you require further treatment.   Keep track of the number of pads you use each day. Keep track of how saturated (soaked) they are. Record this information.   Do not use tampons. Do not douche or have sexual intercourse until approved by your caregiver.   It is very important to keep all follow-up appointments for re-evaluation and continuing management.   Women who have an Rh negative blood type (ie, A, B, AB, or O negative) need to receive a drug called Rh(D) immune globulin. This medicine helps protect future fetuses against problems that can occur if an Rh negative mother is carrying a baby who is Rh positive.  SEEK IMMEDIATE MEDICAL CARE IF:   You experience severe cramps in your stomach, back, or abdomen.   You run an unexplained temperature (record these).   You pass large clots or tissue (save any tissue for your caregiver to inspect).   Your bleeding increases or you become light-headed, weak, or have fainting episodes.  MAKE SURE YOU:   Understand these instructions.   Will watch your condition.   Will get help right away if you are not doing well or get worse.  Document Released: 05/03/2005 Document Revised: 01/13/2011 Document Reviewed: 12/22/2007 [employer] Patient Information 2012 [employer]. 

## 2011-07-02 NOTE — Progress Notes (Signed)
NO PAD ON - NO BLEEDING

## 2011-07-02 NOTE — ED Provider Notes (Signed)
Laurie Montgomery25 [id number] @[redacted]w[redacted]d  presents following passing large clot and possible tissue at home.   SUBJECTIVE  HPI: Pt presents to MAU today following passing single large clot at home.  She reports she may be having a miscarriage and indicates she had an ultrasound on Monday and again yesterday for this.  She reports light vaginal bleeding <1 pad in 2 hours, denies dizziness, fatigue, h/a, urinary symptoms, n/v, or fever/chills.  Past Medical History  Diagnosis Date  . Asthma   . Migraine   . Reflux   . Anxiety   . Anxiety   . Environmental allergies   . Acid reflux   . Hypertension   . Urinary tract infection   . Depression     no meds, currenly ok  . Ovarian cyst   . Chlamydia   . Gestational diabetes    Past Surgical History  Procedure Date  . No past surgeries    History   Social History  . Marital Status: Divorced    Spouse Name: N/A    Number of Children: N/A  . Years of Education: N/A   Occupational History  . Not on file.   Social History Main Topics  . Smoking status: Never Smoker   . Smokeless tobacco: Not on file  . Alcohol Use: No  . Drug Use: No  . Sexually Active: Yes    Birth Control/ Protection: None   Other Topics Concern  . Not on file   Social History Narrative  . No narrative on file   No current facility-administered medications on file prior to encounter.   Current Outpatient Prescriptions on File Prior to Encounter  Medication Sig Dispense Refill  . albuterol (PROVENTIL HFA;VENTOLIN HFA) 108 (90 BASE) MCG/ACT inhaler Inhale 2 puffs into the lungs every 6 (six) hours as needed. For shortness of breath       . HYDROcodone-acetaminophen (NORCO) 5-325 MG per tablet Take 2 tablets by mouth every 6 (six) hours as needed. Takes for pain      . Prenatal Vit-Fe Fumarate-FA (PRENATAL MULTIVITAMIN) TABS Take 1 tablet by mouth daily.       Allergies  Allergen Reactions  . Latex Itching  . Zofran Itching    ROS: Pertinent items in  HPI  OBJECTIVE Blood pressure 107/59, pulse 68, temperature 98 F (36.7 C), temperature source Oral, resp. rate 20, height 5\' 2"  (1.575 m), weight 90.833 kg (200 lb 4 oz), last menstrual period 04/11/2011. GENERAL: Well-developed, well-nourished female in no acute distress.  LUNGS: Clear to auscultation bilaterally.  HEART: Regular rate and rhythm. ABDOMEN: Soft, nontender EXTREMITIES: Nontender, no edema Pelvic exam deferred. Bimanual:  Cervix ft/long/hi, adnexa without tenderness, masses, or enlargement, bright red vaginal bleeding noted with exam, but no bleeding on pad under pt.  RESULTS Results for orders placed during the hospital encounter of 07/02/11 (from the past 24 hour(s))  CBC     Status: Abnormal   Collection Time   07/02/11  6:02 AM      Component Value Range   WBC 7.3  4.0 - 10.5 (K/uL)   RBC 4.13  3.87 - 5.11 (MIL/uL)   Hemoglobin 10.8 (*) 12.0 - 15.0 (g/dL)   HCT 30.8 (*) 65.7 - 46.0 (%)   MCV 82.8  78.0 - 100.0 (fL)   MCH 26.2  26.0 - 34.0 (pg)   MCHC 31.6  30.0 - 36.0 (g/dL)   RDW 84.6  96.2 - 95.2 (%)   Platelets 303  150 - 400 (K/uL)  ABO/RH     Status: Normal   Collection Time   07/02/11  6:02 AM      Component Value Range   ABO/RH(D) A POS    HCG, QUANTITATIVE, PREGNANCY     Status: Abnormal   Collection Time   07/02/11  6:02 AM      Component Value Range   hCG, Beta Chain, Quant, S 7543 (*) <5 (mIU/mL)    IMAGING U/S on 07/01/11 indicates fetal demise measuring [redacted]w[redacted]d.    Previous u/s on 05/28/11 indicated IUP at 5w gestation and u/s on 06/28/11 indicated 5w gestation without cardiac activity, possible demise.  See imaging report for full details.  MANAGEMENT Discussed findings with Dr. Gaynell Face.  Appt in his office today at 11:30 for f/u.  ASSESSMENT Incomplete spontaneous abortion  PLAN D/C home with bleeding precautions F/u with Dr Gaynell Face

## 2011-07-20 ENCOUNTER — Other Ambulatory Visit (HOSPITAL_COMMUNITY): Payer: Self-pay | Admitting: Obstetrics

## 2011-07-20 DIAGNOSIS — IMO0002 Reserved for concepts with insufficient information to code with codable children: Secondary | ICD-10-CM

## 2011-07-22 ENCOUNTER — Ambulatory Visit (HOSPITAL_COMMUNITY)
Admission: RE | Admit: 2011-07-22 | Discharge: 2011-07-22 | Disposition: A | Discharge status: Home / Self Care | Payer: Medicaid Other | Source: Ambulatory Visit | Attending: Obstetrics | Admitting: Obstetrics

## 2011-07-22 DIAGNOSIS — IMO0002 Reserved for concepts with insufficient information to code with codable children: Secondary | ICD-10-CM | POA: Insufficient documentation

## 2011-08-11 ENCOUNTER — Encounter (HOSPITAL_COMMUNITY): Payer: Self-pay | Admitting: *Deleted

## 2011-08-11 ENCOUNTER — Inpatient Hospital Stay (HOSPITAL_COMMUNITY)
Admission: AD | Admit: 2011-08-11 | Discharge: 2011-08-11 | Disposition: A | Discharge status: Home / Self Care | Payer: Medicaid Other | Source: Ambulatory Visit | Attending: Obstetrics | Admitting: Obstetrics

## 2011-08-11 DIAGNOSIS — N949 Unspecified condition associated with female genital organs and menstrual cycle: Secondary | ICD-10-CM | POA: Insufficient documentation

## 2011-08-11 DIAGNOSIS — R102 Pelvic and perineal pain: Secondary | ICD-10-CM

## 2011-08-11 LAB — WET PREP, GENITAL: Yeast Wet Prep HPF POC: NONE SEEN

## 2011-08-11 LAB — POCT PREGNANCY, URINE: Preg Test, Ur: NEGATIVE

## 2011-08-11 MED ORDER — IBUPROFEN 600 MG PO TABS: 600.0000 mg | ORAL_TABLET | Freq: Four times a day (QID) | ORAL | Status: AC | PRN

## 2011-08-11 MED ORDER — IBUPROFEN 600 MG PO TABS
600.0000 mg | ORAL_TABLET | Freq: Once | ORAL | Status: AC
  Administered 2011-08-11: 600 mg via ORAL
  Filled 2011-08-11: qty 1

## 2011-08-11 NOTE — Discharge Instructions (Signed)
Labs pending Condoms always for every intercourse Make an appointment in the office for recheck and contraception Get your prescription filled and take by the directions as needed

## 2011-08-11 NOTE — MAU Note (Signed)
Pt states, " I miscarried  last month ( end of Feb) and on Sunday 17th I had spotted, and I bled heavy on Monday and Tuesday 3/19 & 3/19 like a period and then spotted. Yesterday I started having pelvic pain and vaginal pain with vaginal itching."

## 2011-08-11 NOTE — MAU Provider Note (Signed)
History     CSN: [id number]  Arrival date and time: 08/11/11 1122   First Provider Initiated Contact with Patient 08/11/11 1234      Chief Complaint  Patient presents with  . Vaginal Pain  . Pelvic Pain   HPI Laurie Reed 26 y.o. LMP 08-02-11.  Pregnancy test negative.  Had miscarriage at end of Feb.  Is currently not using condoms or any other birth control.  Began having pelvic and vaginal pain yesterday.  "Feels like I just had a baby."  OB History    Grav Para Term Preterm Abortions TAB SAB Ect Mult Living   4 2 2  0 2 0 2 0 0 2      Past Medical History  Diagnosis Date  . Asthma   . Migraine   . Reflux   . Anxiety   . Anxiety   . Environmental allergies   . Acid reflux   . Hypertension   . Urinary tract infection   . Depression     no meds, currenly ok  . Ovarian cyst   . Chlamydia   . Gestational diabetes     Past Surgical History  Procedure Date  . No past surgeries     Family History  Problem Relation Age of Onset  . Hypotension Neg Hx   . Anesthesia problems Neg Hx   . Malignant hyperthermia Neg Hx   . Pseudochol deficiency Neg Hx   . Asthma Mother   . Heart disease Mother   . Diabetes Mother   . Hypertension Mother   . Asthma Maternal Aunt   . Hypertension Maternal Aunt   . Diabetes Maternal Aunt   . Asthma Maternal Uncle   . Hypertension Maternal Uncle   . Diabetes Maternal Uncle   . Heart disease Maternal Grandmother   . Diabetes Maternal Grandmother   . Hypertension Maternal Grandmother   . Heart disease Maternal Grandfather   . Diabetes Maternal Grandfather   . Hypertension Maternal Grandfather     History  Substance Use Topics  . Smoking status: Never Smoker   . Smokeless tobacco: Never Used  . Alcohol Use: No    Allergies:  Allergies  Allergen Reactions  . Latex Itching  . Zofran Itching    Prescriptions prior to admission  Medication Sig Dispense Refill  . acetaminophen-codeine (TYLENOL #3) 300-30 MG per tablet  Take 1 tablet by mouth every 4 (four) hours as needed. Takes for pain        Review of Systems  Gastrointestinal: Positive for abdominal pain. Negative for nausea and vomiting.  Genitourinary: Negative for dysuria.       Vaginal pain No vaginal bleeding   Physical Exam   Blood pressure 113/67, pulse 83, temperature 98.2 F (36.8 C), temperature source Oral, resp. rate 18, height 5' 1.5" (1.562 m), weight 201 lb 8 oz (91.4 kg), last menstrual period 08/02/2011, unknown if currently breastfeeding.  Physical Exam  Nursing note and vitals reviewed. Constitutional: She is oriented to person, place, and time. She appears well-developed and well-nourished. No distress.  HENT:  Head: Normocephalic.  Eyes: EOM are normal.  Neck: Neck supple.  GI: Soft. There is tenderness. There is no rebound and no guarding.       Diffuse mild tenderness with palpation  Genitourinary:       Speculum exam: Vulva - one area of hypopigmentation at 6 oclock at introitus - no pain with palpation Vagina - Small amount of creamy discharge, no odor Cervix -  No contact bleeding Bimanual exam: Cervix closed Uterus  Tender with palpation, exam limted due to habitus Adnexa non tender Exam limited due to habitus GC/Chlam, wet prep done Chaperone present for exam.  Musculoskeletal: Normal range of motion.  Neurological: She is alert and oriented to person, place, and time.  Skin: Skin is warm and dry.  Psychiatric: She has a normal mood and affect.    MAU Course  Procedures Ibuprofen 600 mg PO for pain in MAU.  MDM Results for orders placed during the hospital encounter of 08/11/11 (from the past 24 hour(s))  POCT PREGNANCY, URINE     Status: Normal   Collection Time   08/11/11 12:22 PM      Component Value Range   Preg Test, Ur NEGATIVE  NEGATIVE   WET PREP, GENITAL     Status: Abnormal   Collection Time   08/11/11 12:42 PM      Component Value Range   Yeast Wet Prep HPF POC NONE SEEN  NONE SEEN     Trich, Wet Prep NONE SEEN  NONE SEEN    Clue Cells Wet Prep HPF POC FEW (*) NONE SEEN    WBC, Wet Prep HPF POC FEW (*) NONE SEEN      Assessment and Plan  No infection identified Pelvic pain  Plan Labs pending Condoms always for every intercourse Make an appointment in the office for recheck and contraception Ibuprofen 600 mg po q 6 hours as needed for pain  Laurie Reed 08/11/2011, 12:36 PM

## 2011-08-24 ENCOUNTER — Inpatient Hospital Stay (HOSPITAL_COMMUNITY)
Admission: AD | Admit: 2011-08-24 | Discharge: 2011-08-24 | Disposition: A | Discharge status: Home / Self Care | Payer: Medicaid Other | Source: Ambulatory Visit | Attending: Obstetrics | Admitting: Obstetrics

## 2011-08-24 ENCOUNTER — Inpatient Hospital Stay (HOSPITAL_COMMUNITY): Payer: Medicaid Other

## 2011-08-24 ENCOUNTER — Encounter (HOSPITAL_COMMUNITY): Payer: Self-pay

## 2011-08-24 DIAGNOSIS — R1032 Left lower quadrant pain: Secondary | ICD-10-CM | POA: Insufficient documentation

## 2011-08-24 DIAGNOSIS — R102 Pelvic and perineal pain: Secondary | ICD-10-CM

## 2011-08-24 DIAGNOSIS — R1031 Right lower quadrant pain: Secondary | ICD-10-CM | POA: Insufficient documentation

## 2011-08-24 DIAGNOSIS — N949 Unspecified condition associated with female genital organs and menstrual cycle: Secondary | ICD-10-CM | POA: Insufficient documentation

## 2011-08-24 LAB — URINALYSIS, ROUTINE W REFLEX MICROSCOPIC
Glucose, UA: NEGATIVE mg/dL
Specific Gravity, Urine: 1.025 (ref 1.005–1.030)
pH: 6 (ref 5.0–8.0)

## 2011-08-24 LAB — URINE MICROSCOPIC-ADD ON

## 2011-08-24 LAB — POCT PREGNANCY, URINE: Preg Test, Ur: NEGATIVE

## 2011-08-24 LAB — WET PREP, GENITAL

## 2011-08-24 MED ORDER — IBUPROFEN 600 MG PO TABS: 600.0000 mg | ORAL_TABLET | Freq: Four times a day (QID) | ORAL | Status: AC | PRN

## 2011-08-24 MED ORDER — DOXYCYCLINE HYCLATE 100 MG PO TABS: 100.0000 mg | ORAL_TABLET | Freq: Two times a day (BID) | ORAL | Status: AC

## 2011-08-24 NOTE — MAU Note (Signed)
Pt states had miscarriage on 07/02/11. For past 3 weeks has had vaginal and lower pelvic pain that is constant. Denies vaginal bleeding or discharge. Was seen in MAU last month and told she didn't have any infections, sent her home with ibuprofen for pain but it isn't helping.

## 2011-08-24 NOTE — Discharge Instructions (Signed)
Pelvic Pain in Women, Generic  Pelvic pain may be constant or come and go. It may be mild or severe. It is important to tell your caregiver exactly where the pain is located, when and how it occurs, and if it is related to your menstrual periods or stress. We have not found a definite cause for your pelvic pain today and you may need follow-up testing and examination.  CAUSES    Sexually transmitted diseases (STDS) cause pelvic inflammatory disease (PID). This is one of the most common causes of pelvic pain. It is an infection of the female sexual organs.   Endometriosis - This is a condition where some of the inside lining of the uterus is growing in the pelvis and abdomen outside the uterus. Along with (chronic) pain, this can cause infertility.   Tubal pregnancy - This is a serious condition where the pregnancy has occurred in a fallopian tube. Rupture of the tube can bleed heavily and cause death if it is not found in time.   Interstitial cystitis is an inflammation of the bladder that causes pelvic pain. People with severe cases of IC may urinate as many as 60 times a day.   Fibroids: A small percentage of women have uterine fibroids (non-cancerous smooth muscle growths in the uterus). Fibroids do not always cause pain.   Fibromyalgia is a disorder with symptoms of widespread muscle pain, fatigue and multiple tender points on the body.   Dysmenorrhea is painful menstrual periods.   Mittlesmertz is pain with ovulation.   Pelvic congestive syndrome, is engorgement of the pelvic veins just before and during a menstrual period.   Cervical stenosis is when the opening of the cervix is too small and causes pain during menstruation.   Adenomyosis (a type of endometriosis) glands that line the inside of the uterus lying in the muscle layer of the uterus.   Intestinal problems such as irritable bowel syndrome colitis or ileitis.   Appendicitis.   Pelvic cancer. Usually the cancer has been there for awhile  before causing pain.   Bladder infection.   Cysts or ovarian tumors.   Kidney stone.   Psychological factors (stress, sexual abuse or depression).   IUD (intrauterine device).   Prolapse (falling down of the uterus).   Retroflexed uterus - the uterus is tipped too far backwards.   Muscle spasms of the pelvic muscles.   Muscular-skeletal problems of the back (herniated disc).  DIAGNOSIS    Your caregiver may order testing, such as:   Blood tests.   Cultures to test for infection.   Ultrasound.   Looking into the bladder with a metal tube with a light (cystoscopy).   Looking into the pelvis and abdomen with very small incisions through a metal tube with a light (laparoscopy).   Looking into the large intestine with a fibro-optic tube with a light (colonoscopy).   CT scan - a type of X-ray to view the internal organs of the pelvis and abdomen.   MRI - views the pelvic and abdominal organs with a magnetic machine.   Intravenous pyelogram - views the kidneys, ureter and bladder after injecting dye through the vein by X-rays.   Injecting barium into the large intestine to view the intestine with X-rays (barium enema).   Not all test results are available during your visit. If your test results are not back during the visit, make an appointment with your caregiver or the medical facility. It is important for you to follow up   on all of your test results.  TREATMENT   Treatment will depend on the cause of the pain, such as:   Medication, antibiotics, pain medication, muscle relaxants, anti-depression drugs, hormones or birth control pills.   Physical therapy.   Acupuncture.   Psychiatric counseling.   Nerve blocks.   Surgery.  HOME CARE INSTRUCTIONS    Finish all medication as prescribed. Incomplete treatment will put you at risk for sterility and tubal pregnancy if your caregiver feels your pain is caused by an infection.   Rest and eat a balanced diet with plenty of fluids.   If you do have an  infection, your recent sexual partners may need treatment even if they are symptom-free or have a negative culture or evaluation. You also need follow-up to make sure you are no longer infected.   Only take over-the-counter or prescription medicines for pain, discomfort or fever as directed by your caregiver.   Apply warm or cold compresses to the lower abdomen depending on which one helps the pain.   Avoid stressful situations that may cause the pain.   Group therapy is sometimes helpful.   Make sure to follow all instructions. Some of the conditions listed above can have very serious outcomes if you do not take the time to follow-up with your caregiver.  SEEK IMMEDIATE MEDICAL CARE IF:    There is heavier bleeding from the birth canal (vagina).   You develop increasing abdominal pain.   You feel lightheaded or pass out.   An unexplained oral temperature above 102 F (38.9 C) develops.   Any of the problems which brought you to us are getting worse.   You are being physically or sexually abused.   You have painful urination.   You are still having pain four hours after taking prescription pain medication.   You have uncontrolled diarrhea.   You have abnormal vaginal discharge.  Document Released: 03/30/2004 Document Revised: 04/22/2011 Document Reviewed: 04/30/2008  [employer] Patient Information 2012 [employer]

## 2011-08-24 NOTE — MAU Provider Note (Signed)
Royetta Montgomery25 y.[id number] with abdominal/vaginal pain. No chief complaint on file.    First Provider Initiated Contact with Patient 08/24/11 1417      SUBJECTIVE  HPI: Pt reports pelvic pain, with onset 3 weeks ago, described as dull all the time with sharp pains occasionally, and pt indicates pain in LLQ and RLQ adnexal regions and in suprapubic area.  She had a miscarriage at the end of February and was seen in MAU for f/u with normal pelvic u/s on 07/20/11.  She denies vaginal bleeding, vaginal itching/burning, h/a, dizziness, fever/chills.    Past Medical History  Diagnosis Date  . Asthma   . Migraine   . Reflux   . Anxiety   . Anxiety   . Environmental allergies   . Acid reflux   . Hypertension   . Urinary tract infection   . Depression     no meds, currenly ok  . Ovarian cyst   . Chlamydia   . Gestational diabetes    Past Surgical History  Procedure Date  . No past surgeries    History   Social History  . Marital Status: Divorced    Spouse Name: N/A    Number of Children: N/A  . Years of Education: N/A   Occupational History  . Not on file.   Social History Main Topics  . Smoking status: Never Smoker   . Smokeless tobacco: Never Used  . Alcohol Use: No  . Drug Use: No  . Sexually Active: Not Currently    Birth Control/ Protection: None     not since last MAU visit   Other Topics Concern  . Not on file   Social History Narrative  . No narrative on file   No current facility-administered medications on file prior to encounter.   Current Outpatient Prescriptions on File Prior to Encounter  Medication Sig Dispense Refill  . DISCONTD: albuterol (PROVENTIL HFA;VENTOLIN HFA) 108 (90 BASE) MCG/ACT inhaler Inhale 2 puffs into the lungs every 6 (six) hours as needed. For shortness of breath        Allergies  Allergen Reactions  . Latex Itching  . Zofran Itching    ROS: Pertinent items in HPI  OBJECTIVE Blood pressure 111/59, pulse 88,  temperature 98.8 F (37.1 C), temperature source Oral, resp. rate 18, last menstrual period 08/02/2011. GENERAL: Well-developed, well-nourished female in no acute distress.  LUNGS: Clear to auscultation bilaterally.  HEART: Regular rate and rhythm. ABDOMEN: Soft, nontender EXTREMITIES: Nontender, no edema Pelvic exam: Cervix pink, without lesion, scant clear discharge, vaginal walls and external genitalia normal Bimanual exam: Cervix 0/long/high, soft, anterior, positive CMT, uterus nontender upon palpation, nonpregnant size, adnexa without enlargement or mass   LAB RESULTS Results for orders placed during the hospital encounter of 08/24/11 (from the past 24 hour(s))  URINALYSIS, ROUTINE W REFLEX MICROSCOPIC     Status: Abnormal   Collection Time   08/24/11  1:55 PM      Component Value Range   Color, Urine YELLOW  YELLOW    APPearance HAZY (*) CLEAR    Specific Gravity, Urine 1.025  1.005 - 1.030    pH 6.0  5.0 - 8.0    Glucose, UA NEGATIVE  NEGATIVE (mg/dL)   Hgb urine dipstick SMALL (*) NEGATIVE    Bilirubin Urine NEGATIVE  NEGATIVE    Ketones, ur NEGATIVE  NEGATIVE (mg/dL)   Protein, ur NEGATIVE  NEGATIVE (mg/dL)   Urobilinogen, UA 0.2  0.0 - 1.0 (mg/dL)   Nitrite NEGATIVE  NEGATIVE    Leukocytes, UA TRACE (*) NEGATIVE   URINE MICROSCOPIC-ADD ON     Status: Abnormal   Collection Time   08/24/11  1:55 PM      Component Value Range   Squamous Epithelial / LPF MANY (*) RARE    WBC, UA 0-2  <3 (WBC/hpf)   RBC / HPF 0-2  <3 (RBC/hpf)   Bacteria, UA RARE  RARE   POCT PREGNANCY, URINE     Status: Normal   Collection Time   08/24/11  2:04 PM      Component Value Range   Preg Test, Ur NEGATIVE  NEGATIVE   WET PREP, GENITAL     Status: Abnormal   Collection Time   08/24/11  2:59 PM      Component Value Range   Yeast Wet Prep HPF POC NONE SEEN  NONE SEEN    Trich, Wet Prep NONE SEEN  NONE SEEN    Clue Cells Wet Prep HPF POC NONE SEEN  NONE SEEN    WBC, Wet Prep HPF POC FEW (*) NONE  SEEN     IMAGING US Transvaginal Non-ob  08/24/2011  *RADIOLOGY REPORT*  Clinical Data: Continued bleeding after miscarriage in February; evaluate for retained products of conception  TRANSVAGINAL ULTRASOUND OF PELVIS  Technique:  Transvaginal ultrasound examination of the pelvis was performed including evaluation of the uterus, ovaries, adnexal regions, and pelvic cul-de-sac.  Comparison:  July 22, 2011  Findings:  The uterus is normal in size and echotexture, measuring 9.0 x 4.6 x 6.3 cm.  The endometrial stripe is thin and homogeneous, measuring 7 mm in width.  Both ovaries have a normal size and appearance.  The right ovary measures 3.6 x 2.7 x 2.6 cm, and the left ovary measures 3.0 x 1.7 x 1.9 cm. There are no adnexal masses.  Trace free pelvic fluid is noted.  IMPRESSION: Normal pelvic ultrasound.  Please note that microscopic foci of R P O [facility] can be sonographically occult.  Please manage clinically.  Original Report Authenticated By: Brandon Melnick, M.D.   ASSESSMENT Pelvic pain  PLAN Discussed pt history and physical findings with Dr Gaynell Face D/C home Urine sent for culture Doxycycline 100 mg BID x10 days F/U in office in 2 weeks Return to MAU as needed

## 2011-08-25 LAB — URINE CULTURE
Colony Count: 40000
Culture  Setup Time: 201304100044

## 2011-11-06 ENCOUNTER — Emergency Department (HOSPITAL_COMMUNITY)
Admission: EM | Admit: 2011-11-06 | Discharge: 2011-11-06 | Disposition: A | Discharge status: Home / Self Care | Payer: Medicaid Other | Attending: Emergency Medicine | Admitting: Emergency Medicine

## 2011-11-06 ENCOUNTER — Encounter (HOSPITAL_COMMUNITY): Payer: Self-pay | Admitting: Emergency Medicine

## 2011-11-06 DIAGNOSIS — M549 Dorsalgia, unspecified: Secondary | ICD-10-CM

## 2011-11-06 DIAGNOSIS — K219 Gastro-esophageal reflux disease without esophagitis: Secondary | ICD-10-CM | POA: Insufficient documentation

## 2011-11-06 DIAGNOSIS — F411 Generalized anxiety disorder: Secondary | ICD-10-CM | POA: Insufficient documentation

## 2011-11-06 DIAGNOSIS — M546 Pain in thoracic spine: Secondary | ICD-10-CM | POA: Insufficient documentation

## 2011-11-06 DIAGNOSIS — I10 Essential (primary) hypertension: Secondary | ICD-10-CM | POA: Insufficient documentation

## 2011-11-06 DIAGNOSIS — J45909 Unspecified asthma, uncomplicated: Secondary | ICD-10-CM | POA: Insufficient documentation

## 2011-11-06 DIAGNOSIS — R079 Chest pain, unspecified: Secondary | ICD-10-CM | POA: Insufficient documentation

## 2011-11-06 DIAGNOSIS — R0781 Pleurodynia: Secondary | ICD-10-CM

## 2011-11-06 HISTORY — DX: Complete or unspecified spontaneous abortion without complication: O03.9

## 2011-11-06 LAB — CBC
HCT: 36.5 % (ref 36.0–46.0)
Hemoglobin: 12 g/dL (ref 12.0–15.0)
MCH: 26.7 pg (ref 26.0–34.0)
MCHC: 32.9 g/dL (ref 30.0–36.0)
MCV: 81.1 fL (ref 78.0–100.0)
Platelets: 287 10*3/uL (ref 150–400)
RBC: 4.5 MIL/uL (ref 3.87–5.11)
RDW: 13.1 % (ref 11.5–15.5)
WBC: 8.7 10*3/uL (ref 4.0–10.5)

## 2011-11-06 LAB — URINALYSIS, ROUTINE W REFLEX MICROSCOPIC
Glucose, UA: NEGATIVE mg/dL
Ketones, ur: NEGATIVE mg/dL
Protein, ur: NEGATIVE mg/dL
pH: 6 (ref 5.0–8.0)

## 2011-11-06 LAB — DIFFERENTIAL
Basophils Absolute: 0 10*3/uL (ref 0.0–0.1)
Basophils Relative: 0 % (ref 0–1)
Eosinophils Absolute: 0.6 10*3/uL (ref 0.0–0.7)
Eosinophils Relative: 7 % — ABNORMAL HIGH (ref 0–5)
Lymphocytes Relative: 40 % (ref 12–46)
Lymphs Abs: 3.5 10*3/uL (ref 0.7–4.0)
Monocytes Absolute: 0.6 10*3/uL (ref 0.1–1.0)
Monocytes Relative: 7 % (ref 3–12)
Neutro Abs: 4 10*3/uL (ref 1.7–7.7)
Neutrophils Relative %: 47 % (ref 43–77)

## 2011-11-06 LAB — BASIC METABOLIC PANEL
BUN: 9 mg/dL (ref 6–23)
CO2: 26 mEq/L (ref 19–32)
Calcium: 9.1 mg/dL (ref 8.4–10.5)
Chloride: 103 mEq/L (ref 96–112)
Creatinine, Ser: 0.64 mg/dL (ref 0.50–1.10)
GFR calc Af Amer: >90 mL/min (ref >90)
GFR calc non Af Amer: >90 mL/min (ref >90)
Glucose, Bld: 93 mg/dL (ref 70–99)
Potassium: 3.9 mEq/L (ref 3.5–5.1)
Sodium: 138 mEq/L (ref 135–145)

## 2011-11-06 LAB — POCT PREGNANCY, URINE: Preg Test, Ur: NEGATIVE

## 2011-11-06 MED ORDER — DIAZEPAM 5 MG PO TABS: 5.0000 mg | ORAL_TABLET | Freq: Two times a day (BID) | ORAL | Status: AC

## 2011-11-06 MED ORDER — DIAZEPAM 5 MG PO TABS
5.0000 mg | ORAL_TABLET | Freq: Once | ORAL | Status: AC
  Administered 2011-11-06: 5 mg via ORAL
  Filled 2011-11-06: qty 1

## 2011-11-06 MED ORDER — IBUPROFEN 800 MG PO TABS: 800.0000 mg | ORAL_TABLET | Freq: Three times a day (TID) | ORAL | Status: AC

## 2011-11-06 MED ORDER — KETOROLAC TROMETHAMINE 60 MG/2ML IM SOLN
60.0000 mg | Freq: Once | INTRAMUSCULAR | Status: AC
  Administered 2011-11-06: 60 mg via INTRAMUSCULAR
  Filled 2011-11-06: qty 2

## 2011-11-06 NOTE — ED Notes (Signed)
Pt reports back pain x 2 weeks and left side pain onset today. Pt reports vomited today, denies urinary symptoms.

## 2011-11-06 NOTE — ED Notes (Signed)
Pt knows that urine is needed. Pt void and did not get a sample pt will try to void again in a little while.

## 2011-11-06 NOTE — ED Notes (Signed)
Pt knows that urine is needed pt is unable to void at this time.  

## 2011-11-06 NOTE — Discharge Instructions (Signed)
Your lab evaluations were normal today. Your pain is most likely muscular in nature. You have been given prescriptions for an anti-inflammatory (Motrin) and a muscle relaxer (Valium). Take these as prescribed. Return to the ED with worsening pain, high fever, shortness of breath, or new symptoms.  Chest Pain (Nonspecific) It is often hard to give a specific diagnosis for the cause of chest pain. There is always a chance that your pain could be related to something serious, such as a heart attack or a blood clot in the lungs. You need to follow up with your caregiver for further evaluation. CAUSES   Heartburn.   Pneumonia or bronchitis.   Anxiety or stress.   Inflammation around your heart (pericarditis) or lung (pleuritis or pleurisy).   A blood clot in the lung.   A collapsed lung (pneumothorax). It can develop suddenly on its own (spontaneous pneumothorax) or from injury (trauma) to the chest.   Shingles infection (herpes zoster virus).  The chest wall is composed of bones, muscles, and cartilage. Any of these can be the source of the pain.  The bones can be bruised by injury.   The muscles or cartilage can be strained by coughing or overwork.   The cartilage can be affected by inflammation and become sore (costochondritis).  DIAGNOSIS  Lab tests or other studies, such as X-rays, electrocardiography, stress testing, or cardiac imaging, may be needed to find the cause of your pain.  TREATMENT   Treatment depends on what may be causing your chest pain. Treatment may include:   Acid blockers for heartburn.   Anti-inflammatory medicine.   Pain medicine for inflammatory conditions.   Antibiotics if an infection is present.   You may be advised to change lifestyle habits. This includes stopping smoking and avoiding alcohol, caffeine, and chocolate.   You may be advised to keep your head raised (elevated) when sleeping. This reduces the chance of acid going backward from your  stomach into your esophagus.   Most of the time, nonspecific chest pain will improve within 2 to 3 days with rest and mild pain medicine.  HOME CARE INSTRUCTIONS   If antibiotics were prescribed, take your antibiotics as directed. Finish them even if you start to feel better.   For the next few days, avoid physical activities that bring on chest pain. Continue physical activities as directed.   Do not smoke.   Avoid drinking alcohol.   Only take over-the-counter or prescription medicine for pain, discomfort, or fever as directed by your caregiver.   Follow your caregiver's suggestions for further testing if your chest pain does not go away.   Keep any follow-up appointments you made. If you do not go to an appointment, you could develop lasting (chronic) problems with pain. If there is any problem keeping an appointment, you must call to reschedule.  SEEK MEDICAL CARE IF:   You think you are having problems from the medicine you are taking. Read your medicine instructions carefully.   Your chest pain does not go away, even after treatment.   You develop a rash with blisters on your chest.  SEEK IMMEDIATE MEDICAL CARE IF:   You have increased chest pain or pain that spreads to your arm, neck, jaw, back, or abdomen.   You develop shortness of breath, an increasing cough, or you are coughing up blood.   You have severe back or abdominal pain, feel nauseous, or vomit.   You develop severe weakness, fainting, or chills.   You  have a fever.  THIS IS AN EMERGENCY. Do not wait to see if the pain will go away. Get medical help at once. Call your local emergency services (911 in [place] Do not drive yourself to the hospital. MAKE SURE YOU:   Understand these instructions.   Will watch your condition.   Will get help right away if you are not doing well or get worse.  Document Released: 02/10/2005 Document Revised: 04/22/2011 Document Reviewed: 12/07/2007 [facility] Patient  Information 2012 [place], [place].

## 2011-11-06 NOTE — ED Provider Notes (Signed)
History     CSN: [id number]  Arrival date & time 11/06/11  1152   First MD Initiated Contact with Patient 11/06/11 1511      Chief Complaint  Patient presents with  . Back Pain  . Flank Pain    (Consider location/radiation/quality/duration/timing/severity/associated sxs/prior treatment) HPI Hx from pt. 26yo F presents with c/o mid back pain x 1 week. Pain is described as aching in nature and worsens with movements. No known trauma or recent change in activity. Pt reports that she developed pain to her left lower ribs today which worsens with deep breaths or movement. No known trauma. No treatment at home prior to coming here. She denies cough or dyspnea with this. She was briefly nauseated and vomited once this morning. Denies abd pain, change in bowel movements, urinary sx, vaginal dc or bleeding.  Patient has no prior history of DVT/PE. Denies recent trauma, surgery, or prolonged immobilization. Denies hemoptysis, leg swelling, or use of exogenous estrogen.  Past Medical History  Diagnosis Date  . Asthma   . Migraine   . Reflux   . Anxiety   . Anxiety   . Environmental allergies   . Acid reflux   . Hypertension   . Urinary tract infection   . Depression     no meds, currenly ok  . Ovarian cyst   . Chlamydia   . Gestational diabetes   . Miscarriage     Past Surgical History  Procedure Date  . No past surgeries     Family History  Problem Relation Age of Onset  . Hypotension Neg Hx   . Anesthesia problems Neg Hx   . Malignant hyperthermia Neg Hx   . Pseudochol deficiency Neg Hx   . Asthma Mother   . Heart disease Mother   . Diabetes Mother   . Hypertension Mother   . Asthma Maternal Aunt   . Hypertension Maternal Aunt   . Diabetes Maternal Aunt   . Asthma Maternal Uncle   . Hypertension Maternal Uncle   . Diabetes Maternal Uncle   . Heart disease Maternal Grandmother   . Diabetes Maternal Grandmother   . Hypertension Maternal Grandmother   . Heart  disease Maternal Grandfather   . Diabetes Maternal Grandfather   . Hypertension Maternal Grandfather     History  Substance Use Topics  . Smoking status: Never Smoker   . Smokeless tobacco: Never Used  . Alcohol Use: No    OB History    Grav Para Term Preterm Abortions TAB SAB Ect Mult Living   4 2 2  0 2 0 2 0 0 2      Review of Systems  Constitutional: Negative for fever, chills, activity change and appetite change.  Respiratory: Negative for cough and shortness of breath.   Cardiovascular: Positive for chest pain. Negative for palpitations and leg swelling.  Gastrointestinal: Positive for nausea and vomiting. Negative for abdominal pain.  Genitourinary: Negative for dysuria, vaginal bleeding and vaginal discharge.  Musculoskeletal: Negative for myalgias.  Skin: Negative for color change and rash.  Neurological: Negative for weakness.    Allergies  Other; Latex; and Zofran  Home Medications  No current outpatient prescriptions on file.  BP 121/74  Pulse 79  Temp 97.6 F (36.4 C) (Oral)  Resp 16  SpO2 100%  LMP 10/19/2011  Physical Exam  Nursing note and vitals reviewed. Constitutional: She appears well-developed and well-nourished. No distress.       Pt in NAD, initially sleeping when I  entered room  HENT:  Head: Normocephalic and atraumatic.  Eyes:       Normal appearance  Neck: Normal range of motion.  Cardiovascular: Normal rate, regular rhythm and normal heart sounds.   Pulmonary/Chest: Effort normal and breath sounds normal. She exhibits tenderness.         ttp over L lower ribs, no deformity/crepitus/rash  Abdominal: Soft. Bowel sounds are normal. There is no tenderness. There is no rebound and no guarding.  Musculoskeletal: Normal range of motion.       Spine: No palpable stepoff, crepitus, or gross deformity appreciated. No appreciable spasm of paravertebral muscles. No midline tenderness. Mildly tender to palp over paravertebral muscles over mid  tspine.   Neurological: She is alert.  Skin: Skin is warm and dry. She is not diaphoretic.  Psychiatric: She has a normal mood and affect.    ED Course  Procedures (including critical care time)  Labs Reviewed  URINALYSIS, ROUTINE W REFLEX MICROSCOPIC - Abnormal; Notable for the following:    APPearance CLOUDY (*)     Hgb urine dipstick SMALL (*)     All other components within normal limits  DIFFERENTIAL - Abnormal; Notable for the following:    Eosinophils Relative 7 (*)     All other components within normal limits  CBC  BASIC METABOLIC PANEL  POCT PREGNANCY, URINE  URINE MICROSCOPIC-ADD ON   No results found.   1. Back pain   2. Rib pain       MDM  Pt with c/o back pain, L rib pain. On exam, reproducibly tender over paraspinal muscles and along ant lower ribs without notable tenderness to upper abd. Pt has no risk factors for PE and she has normal vitals. Pt was treated with toradol and valium and had relief with this. Rx NSAID, valium. Reasons to return to ED discussed.       Grant Fontana, PA-C 11/06/11 1758

## 2011-11-06 NOTE — ED Notes (Signed)
Pt reports taking allergy injections 3 times a week, pt last received an injection last week of May. Pt reports she has not taken her vial back to the [facility] pharmacy to be refilled.

## 2011-11-06 NOTE — ED Notes (Signed)
Pt c/o mid back pain and chest congestion x2 weeks, pt woke this am w/pain under (L) breast radiating down (L) side and non-productive cough. Pt reports vomiting x1 this am, pt also reports increase pain w/deep breath. Pt denies any urinary symptoms, chest/abd pain, abnormal vaginal odor or d/c.

## 2011-11-07 NOTE — ED Provider Notes (Signed)
Medical screening examination/treatment/procedure(s) were performed by non-physician practitioner and as supervising physician I was immediately available for consultation/collaboration.  Hurman Horn, MD 11/07/11 1253

## 2011-12-11 ENCOUNTER — Inpatient Hospital Stay (HOSPITAL_COMMUNITY)
Admission: AD | Admit: 2011-12-11 | Discharge: 2011-12-11 | Disposition: A | Discharge status: Home / Self Care | Payer: Medicaid Other | Source: Ambulatory Visit | Attending: Obstetrics | Admitting: Obstetrics

## 2011-12-11 ENCOUNTER — Encounter (HOSPITAL_COMMUNITY): Payer: Self-pay | Admitting: Obstetrics and Gynecology

## 2011-12-11 DIAGNOSIS — R42 Dizziness and giddiness: Secondary | ICD-10-CM | POA: Insufficient documentation

## 2011-12-11 DIAGNOSIS — R51 Headache: Secondary | ICD-10-CM | POA: Insufficient documentation

## 2011-12-11 LAB — URINALYSIS, ROUTINE W REFLEX MICROSCOPIC
Bilirubin Urine: NEGATIVE
Glucose, UA: NEGATIVE mg/dL
Ketones, ur: NEGATIVE mg/dL
Nitrite: NEGATIVE
Protein, ur: NEGATIVE mg/dL
pH: 7 (ref 5.0–8.0)

## 2011-12-11 LAB — URINE MICROSCOPIC-ADD ON

## 2011-12-11 LAB — GLUCOSE, CAPILLARY: Glucose-Capillary: 74 mg/dL (ref 70–99)

## 2011-12-11 MED ORDER — KETOROLAC TROMETHAMINE 60 MG/2ML IM SOLN
60.0000 mg | Freq: Once | INTRAMUSCULAR | Status: AC
  Administered 2011-12-11: 60 mg via INTRAMUSCULAR
  Filled 2011-12-11: qty 2

## 2011-12-11 MED ORDER — DICLOFENAC SODIUM 75 MG PO TBEC: 75.0000 mg | DELAYED_RELEASE_TABLET | Freq: Two times a day (BID) | ORAL | Status: DC

## 2011-12-11 NOTE — MAU Note (Signed)
Presents with c/o N/V, dizziness, abdominal bloated since Tuesday, vomiting since Thursday LMP 11/22/11

## 2011-12-11 NOTE — MAU Provider Note (Signed)
Chief Complaint:  Headache, Nausea, Vomiting, Dizzines   First Provider Initiated Contact with Patient 12/11/11 1905      Laurie Reed is  26 y.o. [id number].  Patient's last menstrual period was 11/22/2011..   She presents complaining of HA, dizziness, nausea, vomiting. Concerned for pregnancy related to symptoms. Reports LMP 11/22/2011. UPT at home negative. States that she was dizzy yesterday at work and vomiting once after eating a sausage biscuit. Reports eating 1 time daily and rarely drinking fluids while at work.   Obstetrical/Gynecological History: OB History    Grav Para Term Preterm Abortions TAB SAB Ect Mult Living   4 2 2  0 2 0 2 0 0 2      Past Medical History: Past Medical History  Diagnosis Date  . Asthma   . Migraine   . Reflux   . Anxiety   . Anxiety   . Environmental allergies   . Acid reflux   . Hypertension   . Urinary tract infection   . Depression     no meds, currenly ok  . Ovarian cyst   . Chlamydia   . Gestational diabetes   . Miscarriage     Past Surgical History: Past Surgical History  Procedure Date  . No past surgeries     Family History: Family History  Problem Relation Age of Onset  . Hypotension Neg Hx   . Anesthesia problems Neg Hx   . Malignant hyperthermia Neg Hx   . Pseudochol deficiency Neg Hx   . Asthma Mother   . Heart disease Mother   . Diabetes Mother   . Hypertension Mother   . Asthma Maternal Aunt   . Hypertension Maternal Aunt   . Diabetes Maternal Aunt   . Asthma Maternal Uncle   . Hypertension Maternal Uncle   . Diabetes Maternal Uncle   . Heart disease Maternal Grandmother   . Diabetes Maternal Grandmother   . Hypertension Maternal Grandmother   . Heart disease Maternal Grandfather   . Diabetes Maternal Grandfather   . Hypertension Maternal Grandfather     Social History: History  Substance Use Topics  . Smoking status: Never Smoker   . Smokeless tobacco: Never Used  . Alcohol Use: No     Allergies:  Allergies  Allergen Reactions  . Other Anaphylaxis    "20 different types of trees"  . Latex Hives and Itching  . Zofran Itching    Prescriptions prior to admission  Medication Sig Dispense Refill  . albuterol (PROVENTIL HFA;VENTOLIN HFA) 108 (90 BASE) MCG/ACT inhaler Inhale 2 puffs into the lungs every 6 (six) hours as needed. asthma      . cyclobenzaprine (FLEXERIL) 10 MG tablet Take 10 mg by mouth 3 (three) times daily as needed. Muscle spasms      . IBUPROFEN PO Take 1 tablet by mouth daily as needed. Pain; Pt states she received samples from her M.D and does not know strength.      Marland Kitchen PRESCRIPTION MEDICATION Inject 1 each into the muscle 3 (three) times a week. Pt states she is injecting three times a week to control allergies; pt does not know name of medication, was given by allergy clinic.        Review of Systems - History obtained from chart review and the patient General ROS: negative for - chills, fatigue or fever Respiratory ROS: no cough, shortness of breath, or wheezing Cardiovascular ROS: no chest pain or dyspnea on exertion Gastrointestinal ROS: no abdominal pain, change in  bowel habits, or black or bloody stools Genito-Urinary ROS: no dysuria, trouble voiding, or hematuria negative for - change in menstrual cycle, irregular/heavy menses, urinary frequency/urgency or vulvar/vaginal symptoms Neurological ROS: positive for - dizziness and headaches negative for - numbness/tingling or visual changes  Physical Exam   Blood pressure 126/66, pulse 73, temperature 98 F (36.7 C), temperature source Oral, resp. rate 18, height 5\' 2"  (1.575 m), weight 211 lb 12.8 oz (96.072 kg), last menstrual period 11/22/2011.  General: General appearance - alert, well appearing, and in no distress, oriented to person, place, and time and overweight Mental status - alert, oriented to person, place, and time, normal mood, behavior, speech, dress, motor activity, and thought  processes, affect appropriate to mood Eyes - pupils equal and reactive, extraocular eye movements intact Lymphatics - no palpable lymphadenopathy, no hepatosplenomegaly Abdomen - soft, nontender, nondistended, no masses or organomegaly no CVA tenderness Neurological - alert, oriented, normal speech, no focal findings or movement disorder noted, cranial nerves II through XII intact, DTR's normal and symmetric, Babinski sign negative, motor and sensory grossly normal bilaterally, Romberg sign negative, normal gait and station Extremities - peripheral pulses normal, no pedal edema, no clubbing or cyanosis Focused Gynecological Exam: examination not indicated  Labs: Recent Results (from the past 24 hour(s))  URINALYSIS, ROUTINE W REFLEX MICROSCOPIC   Collection Time   12/11/11  4:30 PM      Component Value Range   Color, Urine YELLOW  YELLOW   APPearance HAZY (*) CLEAR   Specific Gravity, Urine 1.015  1.005 - 1.030   pH 7.0  5.0 - 8.0   Glucose, UA NEGATIVE  NEGATIVE mg/dL   Hgb urine dipstick TRACE (*) NEGATIVE   Bilirubin Urine NEGATIVE  NEGATIVE   Ketones, ur NEGATIVE  NEGATIVE mg/dL   Protein, ur NEGATIVE  NEGATIVE mg/dL   Urobilinogen, UA 1.0  0.0 - 1.0 mg/dL   Nitrite NEGATIVE  NEGATIVE   Leukocytes, UA NEGATIVE  NEGATIVE  URINE MICROSCOPIC-ADD ON   Collection Time   12/11/11  4:30 PM      Component Value Range   Squamous Epithelial / LPF MANY (*) RARE   WBC, UA 0-2  <3 WBC/hpf   RBC / HPF 0-2  <3 RBC/hpf   Bacteria, UA RARE  RARE  POCT PREGNANCY, URINE   Collection Time   12/11/11  4:42 PM      Component Value Range   Preg Test, Ur NEGATIVE  NEGATIVE   ED Course: Reviewed with patient negative UPT, no need for blood test secondary to negative 1 month ago and not missed period. Will give Toradol IM now for HA.   Assessment: Headache  Plan: Discharge home FU with Dr. Gaynell Face if no period by Dec 27, 2011 FU with PCP for recurrent HAs, referral list  given.  [name] E. 12/11/2011,7:12 PM

## 2011-12-11 NOTE — MAU Note (Signed)
Patient also complaint of headache

## 2011-12-11 NOTE — MAU Note (Signed)
Patient does state that pregnancy tests in early pregnancy do not show up on urine samples.

## 2012-01-10 ENCOUNTER — Emergency Department (HOSPITAL_COMMUNITY): Payer: Medicaid Other

## 2012-01-10 ENCOUNTER — Encounter (HOSPITAL_COMMUNITY): Payer: Self-pay | Admitting: Emergency Medicine

## 2012-01-10 ENCOUNTER — Emergency Department (HOSPITAL_COMMUNITY)
Admission: EM | Admit: 2012-01-10 | Discharge: 2012-01-10 | Disposition: A | Discharge status: Home / Self Care | Payer: Medicaid Other | Attending: Emergency Medicine | Admitting: Emergency Medicine

## 2012-01-10 DIAGNOSIS — K219 Gastro-esophageal reflux disease without esophagitis: Secondary | ICD-10-CM | POA: Insufficient documentation

## 2012-01-10 DIAGNOSIS — I10 Essential (primary) hypertension: Secondary | ICD-10-CM | POA: Insufficient documentation

## 2012-01-10 DIAGNOSIS — K5900 Constipation, unspecified: Secondary | ICD-10-CM | POA: Insufficient documentation

## 2012-01-10 DIAGNOSIS — R1110 Vomiting, unspecified: Secondary | ICD-10-CM | POA: Insufficient documentation

## 2012-01-10 DIAGNOSIS — J45909 Unspecified asthma, uncomplicated: Secondary | ICD-10-CM | POA: Insufficient documentation

## 2012-01-10 LAB — CBC WITH DIFFERENTIAL/PLATELET
Basophils Absolute: 0.1 10*3/uL (ref 0.0–0.1)
Lymphocytes Relative: 50 % — ABNORMAL HIGH (ref 12–46)
Lymphs Abs: 3.9 10*3/uL (ref 0.7–4.0)
Neutro Abs: 3.1 10*3/uL (ref 1.7–7.7)
Neutrophils Relative %: 39 % — ABNORMAL LOW (ref 43–77)
Platelets: 282 10*3/uL (ref 150–400)
RBC: 4.45 MIL/uL (ref 3.87–5.11)
RDW: 13.1 % (ref 11.5–15.5)
WBC: 7.9 10*3/uL (ref 4.0–10.5)

## 2012-01-10 LAB — BASIC METABOLIC PANEL
CO2: 26 mEq/L (ref 19–32)
Calcium: 9 mg/dL (ref 8.4–10.5)
Chloride: 104 mEq/L (ref 96–112)
Potassium: 4.1 mEq/L (ref 3.5–5.1)
Sodium: 138 mEq/L (ref 135–145)

## 2012-01-10 LAB — URINALYSIS, ROUTINE W REFLEX MICROSCOPIC
Glucose, UA: NEGATIVE mg/dL
Protein, ur: NEGATIVE mg/dL
Specific Gravity, Urine: 1.01 (ref 1.005–1.030)
pH: 7.5 (ref 5.0–8.0)

## 2012-01-10 LAB — POCT PREGNANCY, URINE: Preg Test, Ur: NEGATIVE

## 2012-01-10 LAB — HEPATIC FUNCTION PANEL
AST: 13 U/L (ref 0–37)
Bilirubin, Direct: <0.1 mg/dL (ref 0.0–0.3)
Total Protein: 7.3 g/dL (ref 6.0–8.3)

## 2012-01-10 LAB — URINE MICROSCOPIC-ADD ON

## 2012-01-10 MED ORDER — PROMETHAZINE HCL 25 MG PO TABS: 25.0000 mg | ORAL_TABLET | Freq: Four times a day (QID) | ORAL | Status: DC | PRN

## 2012-01-10 MED ORDER — PROMETHAZINE HCL 25 MG/ML IJ SOLN
12.5000 mg | Freq: Once | INTRAMUSCULAR | Status: AC
  Administered 2012-01-10: 12.5 mg via INTRAVENOUS
  Filled 2012-01-10: qty 1

## 2012-01-10 MED ORDER — SENNOSIDES-DOCUSATE SODIUM 8.6-50 MG PO TABS: 1.0000 | ORAL_TABLET | Freq: Every day | ORAL | Status: DC

## 2012-01-10 NOTE — ED Notes (Signed)
Pt also stated that she has some "dizzy spells" every once in a while.

## 2012-01-10 NOTE — ED Provider Notes (Signed)
Medical screening examination/treatment/procedure(s) were performed by non-physician practitioner and as supervising physician I was immediately available for consultation/collaboration.   Shahara Hartsfield L Cecilio Ohlrich, MD 01/10/12 1530 

## 2012-01-10 NOTE — ED Provider Notes (Signed)
History     CSN: [id number]  Arrival date & time 01/10/12  4098   First MD Initiated Contact with Patient 01/10/12 [phone]      Chief Complaint  Patient presents with  . Emesis    (Consider location/radiation/quality/duration/timing/severity/associated sxs/prior treatment) HPI  Patient presents to the emergency department with complaints of vomiting for one week. She states that she has had 1-2 episodes almost every day for the past week. She says that she is now starting to feel a little weak and dizzy. She admits that she has not been eating or drinking as much because she's been feeling nauseous. He denies having any abdominal pain. Her last menstrual cycle was August 4, her last bowel movement was yesterday morning, she has not had any vomiting episodes today. Her vital signs are stable and she is in NAD.  Past Medical History  Diagnosis Date  . Asthma   . Migraine   . Reflux   . Anxiety   . Anxiety   . Environmental allergies   . Acid reflux   . Hypertension   . Urinary tract infection   . Depression     no meds, currenly ok  . Ovarian cyst   . Chlamydia   . Gestational diabetes   . Miscarriage     Past Surgical History  Procedure Date  . No past surgeries     Family History  Problem Relation Age of Onset  . Hypotension Neg Hx   . Anesthesia problems Neg Hx   . Malignant hyperthermia Neg Hx   . Pseudochol deficiency Neg Hx   . Asthma Mother   . Heart disease Mother   . Diabetes Mother   . Hypertension Mother   . Asthma Maternal Aunt   . Hypertension Maternal Aunt   . Diabetes Maternal Aunt   . Asthma Maternal Uncle   . Hypertension Maternal Uncle   . Diabetes Maternal Uncle   . Heart disease Maternal Grandmother   . Diabetes Maternal Grandmother   . Hypertension Maternal Grandmother   . Heart disease Maternal Grandfather   . Diabetes Maternal Grandfather   . Hypertension Maternal Grandfather     History  Substance Use Topics  . Smoking status:  Never Smoker   . Smokeless tobacco: Never Used  . Alcohol Use: No    OB History    Grav Para Term Preterm Abortions TAB SAB Ect Mult Living   4 2 2  0 2 0 2 0 0 2      Review of Systems   HEENT: denies blurry vision or change in hearing PULMONARY: Denies difficulty breathing and SOB CARDIAC: denies chest pain or heart palpitations MUSCULOSKELETAL:  denies being unable to ambulate ABDOMEN AL: denies abdominal pain, + vomiting GU: denies loss of bowel or urinary control NEURO: denies numbness and tingling in extremities SKIN: no new rashes PSYCH: patient denies anxiety or depression. NECK: Pt denies having neck pain     Allergies  Other; Latex; and Zofran  Home Medications   Current Outpatient Rx  Name Route Sig Dispense Refill  . ALBUTEROL SULFATE HFA 108 (90 BASE) MCG/ACT IN AERS Inhalation Inhale 2 puffs into the lungs every 6 (six) hours as needed. asthma    . CYCLOBENZAPRINE HCL 10 MG PO TABS Oral Take 10 mg by mouth 3 (three) times daily as needed. Muscle spasms    . IBUPROFEN 800 MG PO TABS Oral Take 800 mg by mouth every 8 (eight) hours as needed. For pain    .  PROMETHAZINE HCL 25 MG PO TABS Oral Take 1 tablet (25 mg total) by mouth every 6 (six) hours as needed for nausea. 30 tablet 0    BP 117/74  Pulse 83  Temp 98.3 F (36.8 C) (Oral)  Resp 18  SpO2 99%  LMP 12/19/2011  Physical Exam  Nursing note and vitals reviewed. Constitutional: She appears well-developed and well-nourished. No distress.  HENT:  Head: Normocephalic and atraumatic.  Eyes: Pupils are equal, round, and reactive to light.  Neck: Normal range of motion. Neck supple.  Cardiovascular: Normal rate and regular rhythm.   Pulmonary/Chest: Effort normal.  Abdominal: Soft. She exhibits no distension. There is no tenderness. There is no rebound and no guarding.  Neurological: She is alert.  Skin: Skin is warm and dry.    ED Course  Procedures (including critical care time)  Labs  Reviewed  CBC WITH DIFFERENTIAL - Abnormal; Notable for the following:    Hemoglobin 11.9 (*)     Neutrophils Relative 39 (*)     Lymphocytes Relative 50 (*)     All other components within normal limits  URINALYSIS, ROUTINE W REFLEX MICROSCOPIC - Abnormal; Notable for the following:    APPearance CLOUDY (*)     Leukocytes, UA TRACE (*)     All other components within normal limits  HEPATIC FUNCTION PANEL - Abnormal; Notable for the following:    Albumin 3.3 (*)     Total Bilirubin 0.2 (*)     All other components within normal limits  URINE MICROSCOPIC-ADD ON - Abnormal; Notable for the following:    Squamous Epithelial / LPF FEW (*)     All other components within normal limits  BASIC METABOLIC PANEL  POCT PREGNANCY, URINE  LIPASE, BLOOD   Dg Abd Acute W/chest  01/10/2012  *RADIOLOGY REPORT*  Clinical Data: Nausea and vomiting.  ACUTE ABDOMEN SERIES (ABDOMEN 2 VIEW & CHEST 1 VIEW)  Comparison: Chest radiograph 04/13/2010.  Findings: Frontal view of the chest shows midline trachea and normal heart size.  Lungs are clear.  No pleural fluid.  Two views of the abdomen show a fair amount of stool in the colon. Gas-filled loops of nondilated small bowel are present.  IMPRESSION: Constipation.   Original Report Authenticated By: Reyes Ivan, M.D.      1. Emesis   2. Constipation       MDM  Patient work-up and physical exam benign. Pt fluid challenged in the ER with no adverse event. Pt given Phenergan and Colace for symptoms. Advised of BRAT diet. To follow-up with PCP this week.  Pt has been advised of the symptoms that warrant their return to the ED. Patient has voiced understanding and has agreed to follow-up with the PCP or specialist.         Dorthula Matas, PA 01/10/12 1143

## 2012-01-10 NOTE — ED Notes (Signed)
Pt c/o vomiting x 1 week with generalized weakness and dizziness

## 2012-02-18 ENCOUNTER — Encounter (HOSPITAL_COMMUNITY): Payer: Self-pay | Admitting: Emergency Medicine

## 2012-02-18 ENCOUNTER — Emergency Department (HOSPITAL_COMMUNITY): Payer: Medicaid Other

## 2012-02-18 ENCOUNTER — Emergency Department (HOSPITAL_COMMUNITY)
Admission: EM | Admit: 2012-02-18 | Discharge: 2012-02-18 | Disposition: A | Discharge status: Home / Self Care | Payer: Medicaid Other | Attending: Emergency Medicine | Admitting: Emergency Medicine

## 2012-02-18 DIAGNOSIS — S8990XA Unspecified injury of unspecified lower leg, initial encounter: Secondary | ICD-10-CM | POA: Insufficient documentation

## 2012-02-18 DIAGNOSIS — S93609A Unspecified sprain of unspecified foot, initial encounter: Secondary | ICD-10-CM

## 2012-02-18 DIAGNOSIS — I10 Essential (primary) hypertension: Secondary | ICD-10-CM | POA: Insufficient documentation

## 2012-02-18 DIAGNOSIS — W010XXA Fall on same level from slipping, tripping and stumbling without subsequent striking against object, initial encounter: Secondary | ICD-10-CM | POA: Insufficient documentation

## 2012-02-18 DIAGNOSIS — J45909 Unspecified asthma, uncomplicated: Secondary | ICD-10-CM | POA: Insufficient documentation

## 2012-02-18 DIAGNOSIS — K219 Gastro-esophageal reflux disease without esophagitis: Secondary | ICD-10-CM | POA: Insufficient documentation

## 2012-02-18 MED ORDER — IBUPROFEN 800 MG PO TABS: 800.0000 mg | ORAL_TABLET | Freq: Three times a day (TID) | ORAL | Status: AC | PRN

## 2012-02-18 MED ORDER — TRAMADOL HCL 50 MG PO TABS: 50.0000 mg | ORAL_TABLET | Freq: Four times a day (QID) | ORAL | Status: DC | PRN

## 2012-02-18 NOTE — ED Notes (Signed)
Patient transported to X-ray 

## 2012-02-18 NOTE — ED Provider Notes (Signed)
History   This chart was scribed for Laurie Munch, MD by Melba Coon. The patient was seen in room TR07C/TR07C and the patient's care was started at 4:43PM.    CSN: [id number]  Arrival date & time 02/18/12  1548   First MD Initiated Contact with Patient 02/18/12 1640      Chief Complaint  Patient presents with  . Ankle Pain    (Consider location/radiation/quality/duration/timing/severity/associated sxs/prior treatment) The history is provided by the patient. No language interpreter was used.   Laurie Reed is a 26 y.o. female who presents to the Emergency Department complaining of constant, moderate to severe right foot and ankle pain pertaining to a fall with no head contact or LOC with an onset a week ago. Laurie Reed slipped on some wet grass and she twisted towards her left. Ibuprofen and ice packs have not alleviated the pain. No HA, fever, neck pain, sore throat, rash, back pain, CP, SOB, abd pain, n/v/d, dysuria, or extremity weakness, numbness, or tingling. No other pertinent medical symptoms.  Past Medical History  Diagnosis Date  . Asthma   . Migraine   . Reflux   . Anxiety   . Anxiety   . Environmental allergies   . Acid reflux   . Hypertension   . Urinary tract infection   . Depression     no meds, currenly ok  . Ovarian cyst   . Chlamydia   . Gestational diabetes   . Miscarriage     Past Surgical History  Procedure Date  . No past surgeries     Family History  Problem Relation Age of Onset  . Hypotension Neg Hx   . Anesthesia problems Neg Hx   . Malignant hyperthermia Neg Hx   . Pseudochol deficiency Neg Hx   . Asthma Mother   . Heart disease Mother   . Diabetes Mother   . Hypertension Mother   . Asthma Maternal Aunt   . Hypertension Maternal Aunt   . Diabetes Maternal Aunt   . Asthma Maternal Uncle   . Hypertension Maternal Uncle   . Diabetes Maternal Uncle   . Heart disease Maternal Grandmother   . Diabetes Maternal  Grandmother   . Hypertension Maternal Grandmother   . Heart disease Maternal Grandfather   . Diabetes Maternal Grandfather   . Hypertension Maternal Grandfather     History  Substance Use Topics  . Smoking status: Never Smoker   . Smokeless tobacco: Never Used  . Alcohol Use: No    OB History    Grav Para Term Preterm Abortions TAB SAB Ect Mult Living   4 2 2  0 2 0 2 0 0 2      Review of Systems 10 Systems reviewed and all are negative for acute change except as noted in the HPI.   Allergies  Other; Latex; and Zofran  Home Medications   Current Outpatient Rx  Name Route Sig Dispense Refill  . ALBUTEROL SULFATE HFA 108 (90 BASE) MCG/ACT IN AERS Inhalation Inhale 2 puffs into the lungs every 6 (six) hours as needed. For asthma    . CYCLOBENZAPRINE HCL 10 MG PO TABS Oral Take 10 mg by mouth 3 (three) times daily as needed. For muscle spasms    . IBUPROFEN 800 MG PO TABS Oral Take 800 mg by mouth every 8 (eight) hours as needed. For pain      BP 120/73  Pulse 97  Temp 98.4 F (36.9 C) (Oral)  Resp 18  SpO2  98%  LMP 02/17/2012  Physical Exam  Nursing note and vitals reviewed. Constitutional:       Awake, alert, nontoxic appearance with baseline speech for patient.  HENT:  Head: Atraumatic.  Mouth/Throat: No oropharyngeal exudate.  Eyes: EOM are normal. Pupils are equal, round, and reactive to light. Right eye exhibits no discharge. Left eye exhibits no discharge.  Neck: Neck supple.  Cardiovascular: Normal rate and regular rhythm.   No murmur heard. Pulmonary/Chest: Effort normal and breath sounds normal. No stridor. No respiratory distress. She has no wheezes. She has no rales. She exhibits no tenderness.  Abdominal: Soft. Bowel sounds are normal. She exhibits no mass. There is no tenderness. There is no rebound.  Musculoskeletal:       Right ankle has good distal pulses, can plantar and dorsiflex. Edema around the dorsum of the right mid foot.  Lymphadenopathy:     She has no cervical adenopathy.  Neurological:       Awake, alert, cooperative and aware of situation; motor strength bilaterally; sensation normal to light touch bilaterally; peripheral visual fields full to confrontation; no facial asymmetry; tongue midline; major cranial nerves appear intact; no pronator drift, normal finger to nose bilaterally, baseline gait without new ataxia.  Skin: No rash noted.  Psychiatric: She has a normal mood and affect.    ED Course  Procedures (including critical care time)  COORDINATION OF CARE:  4:48PM - imaging reviewed and is unremarkable. She will be given a brace along with ibuprofen and is advised to f/u with an orthopedist. Laurie Reed is ready for d/c.   Labs Reviewed - No data to display Dg Foot Complete Right  02/18/2012  *RADIOLOGY REPORT*  Clinical Data: Ankle pain.  Slipped on wet grass 1 week ago.  Pain and swelling.  RIGHT FOOT COMPLETE - 3+ VIEW  Comparison: 08/05/2010  Findings: There is no evidence for acute fracture or dislocation. No soft tissue foreign body or gas identified.  IMPRESSION: Negative exam.   Original Report Authenticated By: Patterson Hammersmith, M.D.      No diagnosis found.    MDM  I personally performed the services described in this documentation, which was scribed in my presence. The recorded information has been reviewed and considered.  The patient presents with new left foot pain.  Notably, she has minimal physical complaints, and no ankle tenderness on exam.  The patient's x-ray is negative.  Given the persistency of pain after a twisting, there is suggestion of sprain versus strain.  She was discharged in stable condition with a postoperative shoe and analgesics.  Laurie Munch, MD 02/18/12 1946

## 2012-02-18 NOTE — Progress Notes (Signed)
Orthopedic Tech Progress Note Patient Details:  Laurie Reed 06-06-1985 [id number]  Ortho Devices Type of Ortho Device: Postop boot Ortho Device/Splint Location: r LE Ortho Device/Splint Interventions: Application   Nickayla Mcinnis T 02/18/2012, 5:20 PM

## 2012-02-18 NOTE — ED Notes (Signed)
Pt c/o right foot and ankle pain after twisting ankle last Friday

## 2012-03-07 ENCOUNTER — Emergency Department (HOSPITAL_COMMUNITY)
Admission: EM | Admit: 2012-03-07 | Discharge: 2012-03-07 | Disposition: A | Discharge status: Home / Self Care | Payer: Medicaid Other | Attending: Emergency Medicine | Admitting: Emergency Medicine

## 2012-03-07 ENCOUNTER — Emergency Department (HOSPITAL_COMMUNITY)
Admission: EM | Admit: 2012-03-07 | Discharge: 2012-03-07 | Disposition: A | Discharge status: Home / Self Care | Payer: Self-pay | Source: Home / Self Care

## 2012-03-07 ENCOUNTER — Encounter (HOSPITAL_COMMUNITY): Payer: Self-pay | Admitting: Cardiology

## 2012-03-07 DIAGNOSIS — Z8619 Personal history of other infectious and parasitic diseases: Secondary | ICD-10-CM | POA: Insufficient documentation

## 2012-03-07 DIAGNOSIS — Z8719 Personal history of other diseases of the digestive system: Secondary | ICD-10-CM | POA: Insufficient documentation

## 2012-03-07 DIAGNOSIS — R110 Nausea: Secondary | ICD-10-CM | POA: Insufficient documentation

## 2012-03-07 DIAGNOSIS — N898 Other specified noninflammatory disorders of vagina: Secondary | ICD-10-CM | POA: Insufficient documentation

## 2012-03-07 DIAGNOSIS — Z79899 Other long term (current) drug therapy: Secondary | ICD-10-CM | POA: Insufficient documentation

## 2012-03-07 DIAGNOSIS — J45909 Unspecified asthma, uncomplicated: Secondary | ICD-10-CM | POA: Insufficient documentation

## 2012-03-07 DIAGNOSIS — Z87440 Personal history of urinary (tract) infections: Secondary | ICD-10-CM | POA: Insufficient documentation

## 2012-03-07 DIAGNOSIS — Z202 Contact with and (suspected) exposure to infections with a predominantly sexual mode of transmission: Secondary | ICD-10-CM

## 2012-03-07 DIAGNOSIS — Z8669 Personal history of other diseases of the nervous system and sense organs: Secondary | ICD-10-CM | POA: Insufficient documentation

## 2012-03-07 DIAGNOSIS — Z8659 Personal history of other mental and behavioral disorders: Secondary | ICD-10-CM | POA: Insufficient documentation

## 2012-03-07 DIAGNOSIS — Z8742 Personal history of other diseases of the female genital tract: Secondary | ICD-10-CM | POA: Insufficient documentation

## 2012-03-07 LAB — URINALYSIS, ROUTINE W REFLEX MICROSCOPIC
Glucose, UA: NEGATIVE mg/dL
Leukocytes, UA: NEGATIVE
Specific Gravity, Urine: 1.018 (ref 1.005–1.030)
pH: 7.5 (ref 5.0–8.0)

## 2012-03-07 LAB — BASIC METABOLIC PANEL
BUN: 9 mg/dL (ref 6–23)
CO2: 25 mEq/L (ref 19–32)
Chloride: 101 mEq/L (ref 96–112)
Creatinine, Ser: 0.67 mg/dL (ref 0.50–1.10)
GFR calc Af Amer: >90 mL/min (ref >90)
Potassium: 3.7 mEq/L (ref 3.5–5.1)

## 2012-03-07 LAB — WET PREP, GENITAL
Clue Cells Wet Prep HPF POC: NONE SEEN
Yeast Wet Prep HPF POC: NONE SEEN

## 2012-03-07 LAB — CBC
HCT: 36.3 % (ref 36.0–46.0)
MCV: 80.8 fL (ref 78.0–100.0)
RBC: 4.49 MIL/uL (ref 3.87–5.11)
RDW: 13.4 % (ref 11.5–15.5)
WBC: 7.9 10*3/uL (ref 4.0–10.5)

## 2012-03-07 LAB — URINE MICROSCOPIC-ADD ON

## 2012-03-07 LAB — POCT PREGNANCY, URINE: Preg Test, Ur: NEGATIVE

## 2012-03-07 MED ORDER — PROMETHAZINE HCL 25 MG PO TABS
25.0000 mg | ORAL_TABLET | ORAL | Status: AC
  Administered 2012-03-07: 25 mg via ORAL
  Filled 2012-03-07: qty 1

## 2012-03-07 MED ORDER — AZITHROMYCIN 250 MG PO TABS
1000.0000 mg | ORAL_TABLET | Freq: Once | ORAL | Status: AC
  Administered 2012-03-07: 1000 mg via ORAL
  Filled 2012-03-07: qty 4

## 2012-03-07 MED ORDER — CEFTRIAXONE SODIUM 250 MG IJ SOLR
250.0000 mg | Freq: Once | INTRAMUSCULAR | Status: AC
  Administered 2012-03-07: 250 mg via INTRAMUSCULAR
  Filled 2012-03-07: qty 250

## 2012-03-07 MED ORDER — PROMETHAZINE HCL 50 MG PO TABS: 50.0000 mg | ORAL_TABLET | Freq: Four times a day (QID) | ORAL | Status: DC | PRN

## 2012-03-07 MED ORDER — ACETAMINOPHEN 500 MG PO TABS: 500.0000 mg | ORAL_TABLET | Freq: Four times a day (QID) | ORAL | Status: DC | PRN

## 2012-03-07 NOTE — ED Notes (Signed)
Waiting for phenergan to be sent from pharmacy.  Pt states that she is comfortable at the present.

## 2012-03-07 NOTE — ED Provider Notes (Signed)
History     CSN: [id number]  Arrival date & time 03/07/12  1230   First MD Initiated Contact with Patient 03/07/12 1601      Chief Complaint  Patient presents with  . Nausea  . Emesis  . Vaginal Discharge    (Consider location/radiation/quality/duration/timing/severity/associated sxs/prior treatment) HPI Comments: Patient is an otherwise healthy 26 year old G4P2 female who presents with a 4 day history of vaginal discharge. She reports noticing the discharge every time she goes to the bathroom for the past 4 days. The discharge is white and unable to quantify. Patient has never has this before. No alleviating/aggravating factors. Patient reports associated headache, nausea, and breast tenderness. She had one episode of vomiting after eating bad food 4 days ago. She denies pelvic pain, abnormal vaginal bleeding, menstrual irregularities, dyspareunia, abdominal pain, visual changes, chest pain, SOB. LMP 2 weeks ago. She is not using birth control.   Patient is a 26 y.o. female presenting with vomiting and vaginal discharge.  Emesis   Vaginal Discharge Associated symptoms include nausea and vomiting.    Past Medical History  Diagnosis Date  . Asthma   . Migraine   . Reflux   . Anxiety   . Anxiety   . Environmental allergies   . Acid reflux   . Urinary tract infection   . Depression     no meds, currenly ok  . Ovarian cyst   . Chlamydia   . Miscarriage     Past Surgical History  Procedure Date  . No past surgeries     Family History  Problem Relation Age of Onset  . Hypotension Neg Hx   . Anesthesia problems Neg Hx   . Malignant hyperthermia Neg Hx   . Pseudochol deficiency Neg Hx   . Asthma Mother   . Heart disease Mother   . Diabetes Mother   . Hypertension Mother   . Asthma Maternal Aunt   . Hypertension Maternal Aunt   . Diabetes Maternal Aunt   . Asthma Maternal Uncle   . Hypertension Maternal Uncle   . Diabetes Maternal Uncle   . Heart disease  Maternal Grandmother   . Diabetes Maternal Grandmother   . Hypertension Maternal Grandmother   . Heart disease Maternal Grandfather   . Diabetes Maternal Grandfather   . Hypertension Maternal Grandfather     History  Substance Use Topics  . Smoking status: Never Smoker   . Smokeless tobacco: Never Used  . Alcohol Use: No    OB History    Grav Para Term Preterm Abortions TAB SAB Ect Mult Living   4 2 2  0 2 0 2 0 0 2      Review of Systems  Gastrointestinal: Positive for nausea and vomiting.  Genitourinary: Positive for vaginal discharge.  All other systems reviewed and are negative.    Allergies  Other; Latex; and Zofran  Home Medications   Current Outpatient Rx  Name Route Sig Dispense Refill  . ALBUTEROL SULFATE HFA 108 (90 BASE) MCG/ACT IN AERS Inhalation Inhale 2 puffs into the lungs every 6 (six) hours as needed. For asthma    . CYCLOBENZAPRINE HCL 10 MG PO TABS Oral Take 10 mg by mouth 3 (three) times daily as needed. For muscle spasms    . IBUPROFEN 800 MG PO TABS Oral Take 800 mg by mouth every 8 (eight) hours as needed. Pain    . NAPROXEN 500 MG PO TABS Oral Take 500 mg by mouth 2 (two) times  daily with a meal.      BP 138/81  Pulse 85  Temp 98.4 F (36.9 C) (Oral)  Resp 16  SpO2 100%  LMP 02/17/2012  Physical Exam  Nursing note and vitals reviewed. Constitutional: She is oriented to person, place, and time. She appears well-developed and well-nourished. No distress.  HENT:  Head: Normocephalic and atraumatic.  Eyes: Conjunctivae normal and EOM are normal. Pupils are equal, round, and reactive to light.  Neck: Normal range of motion. Neck supple.  Cardiovascular: Normal rate and regular rhythm.  Exam reveals no gallop and no friction rub.   No murmur heard. Pulmonary/Chest: Effort normal and breath sounds normal. She has no wheezes. She has no rales. She exhibits no tenderness.  Abdominal: Soft. She exhibits no distension and no mass. There is no  tenderness. There is no rebound and no guarding.  Genitourinary: Vaginal discharge found.       Copious amount of white milky vaginal discharge. No adnexal tenderness or masses palpated on bimanual.   Musculoskeletal: Normal range of motion.  Neurological: She is alert and oriented to person, place, and time. Coordination normal.       Speech is goal-oriented. Moves limbs without ataxia.   Skin: Skin is warm and dry. She is not diaphoretic.  Psychiatric: She has a normal mood and affect. Her behavior is normal.    ED Course  Procedures (including critical care time)  Labs Reviewed  URINALYSIS, ROUTINE W REFLEX MICROSCOPIC - Abnormal; Notable for the following:    Hgb urine dipstick TRACE (*)     All other components within normal limits  CBC - Abnormal; Notable for the following:    Hemoglobin 11.9 (*)     All other components within normal limits  URINE MICROSCOPIC-ADD ON - Abnormal; Notable for the following:    Squamous Epithelial / LPF FEW (*)     All other components within normal limits  WET PREP, GENITAL - Abnormal; Notable for the following:    WBC, Wet Prep HPF POC MODERATE (*)     All other components within normal limits  BASIC METABOLIC PANEL  POCT PREGNANCY, URINE  GC/CHLAMYDIA PROBE AMP, GENITAL   No results found.   1. Possible exposure to STD   2. Nausea       MDM  4:24 PM Labs unremarkable. Urine shows no infection and no pregnancy. Pelvic done revealing copious white milky discharge. Wet prep pending as well as GC/Chlamydia. Patient give tylenol and phenergan for headache and nausea.   5:57 PM Patient reports feeling better after tylenol and zofran. Wet prep shows moderate WBCs. I will treat her for GC/Chlamydia per patient's request although she reports being sexually active with her husband only. Patient likely experiencing ovulation which could explain breast tenderness and white discharge in the absence of infection. Patient will be notified with  positive results of GC/Chlamydia as explained to the patient. Patient sees Dr. Gaynell Face for OBGYN and will follow up with him or return to the ED with worsening or concerning symptoms. I will prescribe her tylenol and phenergan for headache and nausea. No further evaluation needed at this time.       Emilia Beck, [place] 03/07/12 1808

## 2012-03-07 NOTE — ED Notes (Signed)
Pt reports she thinks she ate some bad food on Friday and has been vomiting/having abd pain since then. Also reports breast tenderness. Also reports white vaginal discharge, denies any odor. NAD. Headache since Sunday, no neuro deficits or vision changes.

## 2012-03-09 NOTE — ED Provider Notes (Signed)
Medical screening examination/treatment/procedure(s) were performed by non-physician practitioner and as supervising physician I was immediately available for consultation/collaboration.   Gwyneth Sprout, MD 03/09/12 1332

## 2012-04-27 ENCOUNTER — Emergency Department (HOSPITAL_COMMUNITY)
Admission: EM | Admit: 2012-04-27 | Discharge: 2012-04-27 | Disposition: A | Discharge status: Home / Self Care | Payer: Medicaid Other | Attending: Emergency Medicine | Admitting: Emergency Medicine

## 2012-04-27 ENCOUNTER — Encounter (HOSPITAL_COMMUNITY): Payer: Self-pay

## 2012-04-27 DIAGNOSIS — R112 Nausea with vomiting, unspecified: Secondary | ICD-10-CM | POA: Insufficient documentation

## 2012-04-27 DIAGNOSIS — J45909 Unspecified asthma, uncomplicated: Secondary | ICD-10-CM | POA: Insufficient documentation

## 2012-04-27 DIAGNOSIS — Z3202 Encounter for pregnancy test, result negative: Secondary | ICD-10-CM | POA: Insufficient documentation

## 2012-04-27 DIAGNOSIS — Z8742 Personal history of other diseases of the female genital tract: Secondary | ICD-10-CM | POA: Insufficient documentation

## 2012-04-27 DIAGNOSIS — Z8659 Personal history of other mental and behavioral disorders: Secondary | ICD-10-CM | POA: Insufficient documentation

## 2012-04-27 DIAGNOSIS — R209 Unspecified disturbances of skin sensation: Secondary | ICD-10-CM | POA: Insufficient documentation

## 2012-04-27 DIAGNOSIS — R202 Paresthesia of skin: Secondary | ICD-10-CM

## 2012-04-27 DIAGNOSIS — Z8619 Personal history of other infectious and parasitic diseases: Secondary | ICD-10-CM | POA: Insufficient documentation

## 2012-04-27 DIAGNOSIS — Z8679 Personal history of other diseases of the circulatory system: Secondary | ICD-10-CM | POA: Insufficient documentation

## 2012-04-27 DIAGNOSIS — Z8719 Personal history of other diseases of the digestive system: Secondary | ICD-10-CM | POA: Insufficient documentation

## 2012-04-27 DIAGNOSIS — Z79899 Other long term (current) drug therapy: Secondary | ICD-10-CM | POA: Insufficient documentation

## 2012-04-27 DIAGNOSIS — Z87440 Personal history of urinary (tract) infections: Secondary | ICD-10-CM | POA: Insufficient documentation

## 2012-04-27 LAB — POCT PREGNANCY, URINE: Preg Test, Ur: NEGATIVE

## 2012-04-27 LAB — COMPREHENSIVE METABOLIC PANEL
ALT: 12 U/L (ref 0–35)
Alkaline Phosphatase: 81 U/L (ref 39–117)
BUN: 11 mg/dL (ref 6–23)
CO2: 25 mEq/L (ref 19–32)
GFR calc Af Amer: >90 mL/min (ref >90)
GFR calc non Af Amer: >90 mL/min (ref >90)
Glucose, Bld: 87 mg/dL (ref 70–99)
Potassium: 4.2 mEq/L (ref 3.5–5.1)
Sodium: 138 mEq/L (ref 135–145)

## 2012-04-27 LAB — CBC WITH DIFFERENTIAL/PLATELET
Basophils Absolute: 0 10*3/uL (ref 0.0–0.1)
Basophils Relative: 1 % (ref 0–1)
Lymphocytes Relative: 51 % — ABNORMAL HIGH (ref 12–46)
MCHC: 31.5 g/dL (ref 30.0–36.0)
Monocytes Absolute: 0.4 10*3/uL (ref 0.1–1.0)
Neutro Abs: 2.8 10*3/uL (ref 1.7–7.7)
Neutrophils Relative %: 38 % — ABNORMAL LOW (ref 43–77)
Platelets: 289 10*3/uL (ref 150–400)
RDW: 13.1 % (ref 11.5–15.5)
WBC: 7.2 10*3/uL (ref 4.0–10.5)

## 2012-04-27 NOTE — ED Notes (Addendum)
Pt states she is having "jittery" feelings in her stomach and is unsure what they are coming from. Also c/o left arm numbness and pain since the middle of this year. States she is unable to hold things in her hand. Also reports nausea

## 2012-04-27 NOTE — ED Provider Notes (Signed)
History   This chart was scribed for Doug Sou, MD by Leone Payor, ED Scribe. This patient was seen in room TR08C/TR08C and the patient's care was started at 4:00PM.   CSN: [id number]  Arrival date & time 04/27/12  1518   First MD Initiated Contact with Patient 04/27/12 1600      Chief Complaint  Patient presents with  . Arm Pain     The history is provided by the patient. No language interpreter was used.    Laurie Reed is a 26 y.o. female who presents to the Emergency Department complaining of intermittent, mild to moderate numbness and pain in the left shoulder radiating down to left hand of about 6 months. She states she is unable to hold things in her hand and has been diagnosed with muscle spasms by Dr. Loleta Chance symptoms are intermittent. She has been prescribed Flexeril tramadol and naproxen for her arm pain without relief. She has not told Dr. Loleta Chance that these medications are not helping  Pt also complains of  "jittery" feelings in stomach starting 1 year ago. She has associated nausea, vomiting with last episode 1 day ago. She denies any diarrhea. No abdominal pain presently   PCP is Dr. Loleta Chance Pt has h/o asthma, migraine, reflux, anxiety, UTI, depression, ovarian cysts, chlamydia.  Pt denies smoking and alcohol use. Denies use of recreational drugs.  Past Medical History  Diagnosis Date  . Asthma   . Migraine   . Reflux   . Anxiety   . Anxiety   . Environmental allergies   . Acid reflux   . Urinary tract infection   . Depression     no meds, currenly ok  . Ovarian cyst   . Chlamydia   . Miscarriage     Past Surgical History  Procedure Date  . No past surgeries     Family History  Problem Relation Age of Onset  . Hypotension Neg Hx   . Anesthesia problems Neg Hx   . Malignant hyperthermia Neg Hx   . Pseudochol deficiency Neg Hx   . Asthma Mother   . Heart disease Mother   . Diabetes Mother   . Hypertension Mother   . Asthma Maternal Aunt   .  Hypertension Maternal Aunt   . Diabetes Maternal Aunt   . Asthma Maternal Uncle   . Hypertension Maternal Uncle   . Diabetes Maternal Uncle   . Heart disease Maternal Grandmother   . Diabetes Maternal Grandmother   . Hypertension Maternal Grandmother   . Heart disease Maternal Grandfather   . Diabetes Maternal Grandfather   . Hypertension Maternal Grandfather     History  Substance Use Topics  . Smoking status: Never Smoker   . Smokeless tobacco: Never Used  . Alcohol Use: No    OB History    Grav Para Term Preterm Abortions TAB SAB Ect Mult Living   4 2 2  0 2 0 2 0 0 2      Review of Systems  HENT: Negative.   Respiratory: Negative.   Cardiovascular: Negative.   Gastrointestinal: Positive for nausea, vomiting and abdominal pain. Negative for diarrhea.  Musculoskeletal: Positive for myalgias (to left arm).  Skin: Negative.   Neurological: Positive for numbness.  Hematological: Negative.   Psychiatric/Behavioral: Negative.     Allergies  Other; Latex; and Zofran  Home Medications   Current Outpatient Rx  Name  Route  Sig  Dispense  Refill  . ALBUTEROL SULFATE HFA 108 (90 BASE) MCG/ACT  IN AERS   Inhalation   Inhale 2 puffs into the lungs every 6 (six) hours as needed. For asthma         . CYCLOBENZAPRINE HCL 10 MG PO TABS   Oral   Take 15 mg by mouth 3 (three) times daily as needed. For muscle spasms         . IBUPROFEN 800 MG PO TABS   Oral   Take 800 mg by mouth every 8 (eight) hours as needed. Pain         . NAPROXEN 500 MG PO TABS   Oral   Take 500 mg by mouth 2 (two) times daily with a meal.         . TRAMADOL HCL 50 MG PO TABS   Oral   Take 50 mg by mouth every 6 (six) hours as needed. For pain           BP 121/81  Pulse 76  Temp 97.9 F (36.6 C) (Oral)  Resp 18  SpO2 100%  LMP 04/15/2012  Physical Exam  Nursing note and vitals reviewed. Constitutional: She is oriented to person, place, and time. She appears well-developed and  well-nourished.  HENT:  Head: Normocephalic and atraumatic.  Eyes: Conjunctivae normal are normal. Pupils are equal, round, and reactive to light.  Neck: Neck supple. No tracheal deviation present. No thyromegaly present.  Cardiovascular: Normal rate and regular rhythm.   No murmur heard. Pulmonary/Chest: Effort normal and breath sounds normal.  Abdominal: Soft. Bowel sounds are normal. She exhibits no distension. There is no tenderness.       Obese  Musculoskeletal: Normal range of motion. She exhibits no edema and no tenderness.  Neurological: She is alert and oriented to person, place, and time. She has normal reflexes. Coordination normal.       Motor strength 5 over 5 overall gait normal Romberg normal pronator drift normal  Skin: Skin is warm and dry. No rash noted.  Psychiatric: She has a normal mood and affect.    ED Course  Procedures (including critical care time)  DIAGNOSTIC STUDIES: Oxygen Saturation is 100% on room air, normal by my interpretation.    COORDINATION OF CARE:     Labs Reviewed - No data to display No results found.   No diagnosis found.    MDM  Patient requested another provider as I advised her that she see Dr. Loleta Chance to advise him that medications are not adequate All patient's symptoms are chronic. She has normal exam. She is transferred to CDU for evaluation by Mr. Katrinka Blazing  Dx #1 chronic pain #2 chronic numbness   I personally performed the services described in this documentation, which was scribed in my presence. The recorded information has been reviewed and considered.   Doug Sou, MD 04/27/12 [phone]

## 2012-04-27 NOTE — ED Provider Notes (Signed)
Labs reviewed and shared with patient.  Discussed her concerns regarding recurrent symptoms of headaches (hx of migraines) and intermittent paresthesias of upper extremities.  Patient will likely eventually need a neurologic workup that can be arranged through her primary care provider.  Patient discharged home with continuation of current medication regimen and for follow-up with her PCP.  Jimmye Norman, NP 04/27/12 1827

## 2012-04-27 NOTE — ED Notes (Signed)
Pt c/o numbness in right arm for one year worse today. States has difficulty holding objects. Pt express concerns that mother has MS. Pt also c/o of "nervousness in stomach. Denies pain or nausea, vomiting or change in bowel habits.

## 2012-04-27 NOTE — ED Notes (Signed)
PA aware of pt pain level. Pt sitting watching tv at discharge. Face relaxed and ambulates without distress

## 2012-04-27 NOTE — ED Provider Notes (Signed)
Medical screening examination/treatment/procedure(s) were conducted as a shared visit with non-physician practitioner(s) and myself.  I personally evaluated the patient during the encounter  Doug Sou, MD 04/27/12 2019

## 2012-05-17 NOTE — L&D Delivery Note (Signed)
Delivery Note At 4:22 PM a viable female was delivered via Vaginal, Spontaneous Delivery (Presentation: Middle Occiput Posterior).  APGAR: 8, 9; weight .   Placenta status: , .  Cord:  with the following complications: .  Cord pH: not done  Anesthesia: Epidural  Episiotomy: None Lacerations: 1st degree;Perineal Suture Repair: 2.0 vicryl Est. Blood Loss (mL):   Mom to postpartum.  Baby to Couplet care / Skin to Skin.  MARSHALL,BERNARD A 04/10/2013, 4:31 PM

## 2012-05-29 ENCOUNTER — Emergency Department (INDEPENDENT_AMBULATORY_CARE_PROVIDER_SITE_OTHER)
Admission: EM | Admit: 2012-05-29 | Discharge: 2012-05-29 | Disposition: A | Discharge status: Home / Self Care | Payer: Medicaid Other | Source: Home / Self Care | Attending: Family Medicine | Admitting: Family Medicine

## 2012-05-29 ENCOUNTER — Encounter (HOSPITAL_COMMUNITY): Payer: Self-pay | Admitting: Emergency Medicine

## 2012-05-29 DIAGNOSIS — J111 Influenza due to unidentified influenza virus with other respiratory manifestations: Secondary | ICD-10-CM

## 2012-05-29 MED ORDER — KETOROLAC TROMETHAMINE 30 MG/ML IJ SOLN
30.0000 mg | Freq: Once | INTRAMUSCULAR | Status: AC
  Administered 2012-05-29: 30 mg via INTRAMUSCULAR

## 2012-05-29 MED ORDER — KETOROLAC TROMETHAMINE 30 MG/ML IJ SOLN
INTRAMUSCULAR | Status: AC
  Filled 2012-05-29: qty 1

## 2012-05-29 MED ORDER — PROMETHAZINE HCL 25 MG PO TABS: 25.0000 mg | ORAL_TABLET | Freq: Four times a day (QID) | ORAL | Status: DC | PRN

## 2012-05-29 NOTE — ED Notes (Signed)
Pt given medication then will discharge.

## 2012-05-29 NOTE — ED Notes (Signed)
Pt c/o migraines. Hx of migraines. Pt also is having vomiting,diarrhea,body aches. Pt denies fever. Pt states symptoms started yesterday morning. Pt has not used any otc meds for symptoms.

## 2012-05-29 NOTE — ED Provider Notes (Signed)
History     CSN: [id number]  Arrival date & time 05/29/12  1706   First MD Initiated Contact with Patient 05/29/12 1707      Chief Complaint  Patient presents with  . Influenza    vomiting diarrhea. body aches. dizziness. migraines.    (Consider location/radiation/quality/duration/timing/severity/associated sxs/prior treatment) Patient is a 27 y.o. female presenting with flu symptoms. The history is provided by the patient.  Influenza This is a new problem. The current episode started yesterday. The problem has been gradually improving. Associated symptoms include headaches. Pertinent negatives include no chest pain and no abdominal pain.    Past Medical History  Diagnosis Date  . Asthma   . Migraine   . Reflux   . Anxiety   . Anxiety   . Environmental allergies   . Acid reflux   . Urinary tract infection   . Depression     no meds, currenly ok  . Ovarian cyst   . Chlamydia   . Miscarriage     Past Surgical History  Procedure Date  . No past surgeries     Family History  Problem Relation Age of Onset  . Hypotension Neg Hx   . Anesthesia problems Neg Hx   . Malignant hyperthermia Neg Hx   . Pseudochol deficiency Neg Hx   . Asthma Mother   . Heart disease Mother   . Diabetes Mother   . Hypertension Mother   . Asthma Maternal Aunt   . Hypertension Maternal Aunt   . Diabetes Maternal Aunt   . Asthma Maternal Uncle   . Hypertension Maternal Uncle   . Diabetes Maternal Uncle   . Heart disease Maternal Grandmother   . Diabetes Maternal Grandmother   . Hypertension Maternal Grandmother   . Heart disease Maternal Grandfather   . Diabetes Maternal Grandfather   . Hypertension Maternal Grandfather     History  Substance Use Topics  . Smoking status: Never Smoker   . Smokeless tobacco: Never Used  . Alcohol Use: No    OB History    Grav Para Term Preterm Abortions TAB SAB Ect Mult Living   4 2 2  0 2 0 2 0 0 2      Review of Systems  Constitutional:  Negative.   HENT: Negative for congestion and rhinorrhea.   Respiratory: Negative for cough.   Cardiovascular: Negative for chest pain.  Gastrointestinal: Negative.  Negative for abdominal pain.  Genitourinary: Negative.   Neurological: Positive for headaches.    Allergies  Other; Latex; and Zofran  Home Medications   Current Outpatient Rx  Name  Route  Sig  Dispense  Refill  . ALBUTEROL SULFATE HFA 108 (90 BASE) MCG/ACT IN AERS   Inhalation   Inhale 2 puffs into the lungs every 6 (six) hours as needed. For asthma         . CYCLOBENZAPRINE HCL 10 MG PO TABS   Oral   Take 15 mg by mouth 3 (three) times daily as needed. For muscle spasms         . IBUPROFEN 800 MG PO TABS   Oral   Take 800 mg by mouth every 8 (eight) hours as needed. Pain         . NAPROXEN 500 MG PO TABS   Oral   Take 500 mg by mouth 2 (two) times daily with a meal.         . PROMETHAZINE HCL 25 MG PO TABS   Oral  Take 1 tablet (25 mg total) by mouth every 6 (six) hours as needed for nausea.   10 tablet   0   . TRAMADOL HCL 50 MG PO TABS   Oral   Take 50 mg by mouth every 6 (six) hours as needed. For pain           BP 135/97  Pulse 71  Temp 98.3 F (36.8 C) (Oral)  Resp 20  SpO2 100%  LMP 05/16/2012  Physical Exam  Nursing note and vitals reviewed. Constitutional: She appears well-developed and well-nourished.  HENT:  Head: Normocephalic.  Right Ear: External ear normal.  Left Ear: External ear normal.  Mouth/Throat: Oropharynx is clear and moist.  Eyes: Conjunctivae normal are normal. Pupils are equal, round, and reactive to light.  Neck: Normal range of motion. Neck supple.  Cardiovascular: Normal rate and regular rhythm.   Pulmonary/Chest: Effort normal and breath sounds normal.  Abdominal: Soft. Bowel sounds are normal. She exhibits no distension and no mass. There is no tenderness. There is no rebound and no guarding.  Lymphadenopathy:    She has no cervical adenopathy.   Skin: Skin is warm and dry.    ED Course  Procedures (including critical care time)  Labs Reviewed - No data to display No results found.   1. Influenza-like illness       MDM          Linna Hoff, MD 05/29/12 [phone]

## 2012-08-03 ENCOUNTER — Emergency Department (HOSPITAL_COMMUNITY)
Admission: EM | Admit: 2012-08-03 | Discharge: 2012-08-03 | Disposition: A | Discharge status: Home / Self Care | Payer: Self-pay | Attending: Emergency Medicine | Admitting: Emergency Medicine

## 2012-08-03 ENCOUNTER — Encounter (HOSPITAL_COMMUNITY): Payer: Self-pay | Admitting: Emergency Medicine

## 2012-08-03 DIAGNOSIS — J45909 Unspecified asthma, uncomplicated: Secondary | ICD-10-CM | POA: Insufficient documentation

## 2012-08-03 DIAGNOSIS — R059 Cough, unspecified: Secondary | ICD-10-CM | POA: Insufficient documentation

## 2012-08-03 DIAGNOSIS — J3489 Other specified disorders of nose and nasal sinuses: Secondary | ICD-10-CM | POA: Insufficient documentation

## 2012-08-03 DIAGNOSIS — R51 Headache: Secondary | ICD-10-CM | POA: Insufficient documentation

## 2012-08-03 DIAGNOSIS — Z8619 Personal history of other infectious and parasitic diseases: Secondary | ICD-10-CM | POA: Insufficient documentation

## 2012-08-03 DIAGNOSIS — Z87440 Personal history of urinary (tract) infections: Secondary | ICD-10-CM | POA: Insufficient documentation

## 2012-08-03 DIAGNOSIS — Z8742 Personal history of other diseases of the female genital tract: Secondary | ICD-10-CM | POA: Insufficient documentation

## 2012-08-03 DIAGNOSIS — Z791 Long term (current) use of non-steroidal anti-inflammatories (NSAID): Secondary | ICD-10-CM | POA: Insufficient documentation

## 2012-08-03 DIAGNOSIS — Z8679 Personal history of other diseases of the circulatory system: Secondary | ICD-10-CM | POA: Insufficient documentation

## 2012-08-03 DIAGNOSIS — R6889 Other general symptoms and signs: Secondary | ICD-10-CM | POA: Insufficient documentation

## 2012-08-03 DIAGNOSIS — J029 Acute pharyngitis, unspecified: Secondary | ICD-10-CM | POA: Insufficient documentation

## 2012-08-03 DIAGNOSIS — Z8659 Personal history of other mental and behavioral disorders: Secondary | ICD-10-CM | POA: Insufficient documentation

## 2012-08-03 DIAGNOSIS — K219 Gastro-esophageal reflux disease without esophagitis: Secondary | ICD-10-CM | POA: Insufficient documentation

## 2012-08-03 DIAGNOSIS — J069 Acute upper respiratory infection, unspecified: Secondary | ICD-10-CM | POA: Insufficient documentation

## 2012-08-03 LAB — RAPID STREP SCREEN (MED CTR MEBANE ONLY): Streptococcus, Group A Screen (Direct): NEGATIVE

## 2012-08-03 MED ORDER — OXYCODONE-ACETAMINOPHEN 5-325 MG PO TABS
1.0000 | ORAL_TABLET | Freq: Once | ORAL | Status: AC
  Administered 2012-08-03: 1 via ORAL
  Filled 2012-08-03: qty 1

## 2012-08-03 MED ORDER — GUAIFENESIN 100 MG/5ML PO LIQD: 100.0000 mg | ORAL | Status: DC | PRN

## 2012-08-03 MED ORDER — HYDROCODONE-ACETAMINOPHEN 7.5-325 MG/15ML PO SOLN: 15.0000 mL | Freq: Four times a day (QID) | ORAL | Status: DC | PRN

## 2012-08-03 NOTE — ED Notes (Signed)
The patient is AOx4 and comfortable with her discharge instructions. 

## 2012-08-03 NOTE — ED Provider Notes (Signed)
History     CSN: [id number]  Arrival date & time 08/03/12  2021   First MD Initiated Contact with Patient 08/03/12 2307      Chief Complaint  Patient presents with  . Migraine  . Sore Throat    (Consider location/radiation/quality/duration/timing/severity/associated sxs/prior treatment) HPI  27 year old female with history of migraine headaches presents complaining of headache. Patient reports gradual onset of headache for the past week with associated runny nose, sneezing, sore throat, and nonproductive cough. Symptom has been waxing and waning. Headaches described as a throbbing sensation to forehead with nasal congestion. She denies fever, chills, vision changes, nausea, vomiting, diarrhea, chest pain, shortness of breath, abdominal pain, or rash. She has tried taking her Flexeril, naproxen, and NyQuil with some relief.  Past Medical History  Diagnosis Date  . Asthma   . Migraine   . Reflux   . Anxiety   . Anxiety   . Environmental allergies   . Acid reflux   . Urinary tract infection   . Depression     no meds, currenly ok  . Ovarian cyst   . Chlamydia   . Miscarriage     Past Surgical History  Procedure Laterality Date  . No past surgeries      Family History  Problem Relation Age of Onset  . Hypotension Neg Hx   . Anesthesia problems Neg Hx   . Malignant hyperthermia Neg Hx   . Pseudochol deficiency Neg Hx   . Asthma Mother   . Heart disease Mother   . Diabetes Mother   . Hypertension Mother   . Asthma Maternal Aunt   . Hypertension Maternal Aunt   . Diabetes Maternal Aunt   . Asthma Maternal Uncle   . Hypertension Maternal Uncle   . Diabetes Maternal Uncle   . Heart disease Maternal Grandmother   . Diabetes Maternal Grandmother   . Hypertension Maternal Grandmother   . Heart disease Maternal Grandfather   . Diabetes Maternal Grandfather   . Hypertension Maternal Grandfather     History  Substance Use Topics  . Smoking status: Never Smoker    . Smokeless tobacco: Never Used  . Alcohol Use: No    OB History   Grav Para Term Preterm Abortions TAB SAB Ect Mult Living   4 2 2  0 2 0 2 0 0 2      Review of Systems  Constitutional:       10 Systems reviewed and all are negative for acute change except as noted in the HPI.     Allergies  Other; Latex; and Zofran  Home Medications   Current Outpatient Rx  Name  Route  Sig  Dispense  Refill  . cyclobenzaprine (FLEXERIL) 10 MG tablet   Oral   Take 15 mg by mouth 3 (three) times daily as needed. For muscle spasms         . naproxen (NAPROSYN) 500 MG tablet   Oral   Take 500 mg by mouth 2 (two) times daily with a meal.         . Pseudoeph-Doxylamine-DM-APAP (NYQUIL PO)   Oral   Take 2 capsules by mouth at bedtime as needed (for cold/congestion).           BP 131/84  Pulse 98  Temp(Src) 98.2 F (36.8 C) (Oral)  Resp 18  SpO2 99%  LMP 07/11/2012  Physical Exam  Nursing note and vitals reviewed. Constitutional: She appears well-developed and well-nourished. No distress.  Awake, alert, nontoxic  appearance  HENT:  Head: Atraumatic.  Right Ear: External ear normal.  Left Ear: External ear normal.  Nasal congestion with mild rhinorrhea.  Throat: Uvula is midline, mild tonsil enlargement bilaterally without exudates. No evidence of deep tissue infection.  Eyes: Conjunctivae and EOM are normal. Pupils are equal, round, and reactive to light. Right eye exhibits no discharge. Left eye exhibits no discharge.  Neck: Neck supple.  No nuchal rigidity, no meningismal sign.  Cardiovascular: Normal rate and regular rhythm.   Pulmonary/Chest: Effort normal. No respiratory distress. She exhibits no tenderness.  Abdominal: Soft. There is no tenderness. There is no rebound.  Musculoskeletal: She exhibits no tenderness.  ROM appears intact, no obvious focal weakness  Lymphadenopathy:    She has no cervical adenopathy.  Neurological: She is alert. GCS eye subscore is  4. GCS verbal subscore is 5. GCS motor subscore is 6.  Mental status and motor strength appears intact  Skin: No rash noted.  Psychiatric: She has a normal mood and affect.    ED Course  Procedures (including critical care time)  11:27 PM Patient's headache is likely secondary to the URI. No red flags. No evidence suggestive of meningitis. Patient is afebrile with stable normal vital sign. We'll treat symptomatically. Strep test is negative.  Labs Reviewed  RAPID STREP SCREEN   No results found.   1. Headache   2. URI (upper respiratory infection)       MDM  BP 131/84  Pulse 98  Temp(Src) 98.2 F (36.8 C) (Oral)  Resp 18  SpO2 99%  LMP 07/11/2012         Fayrene Helper, PA-C 08/04/12 2001

## 2012-08-03 NOTE — ED Notes (Signed)
PT. REPORTS MIGRAINE HEADACHE / SORE THROAT FOR SEVERAL DAYS , UNABLE TO FILL PRESCRIPTION FOR MIGRAINE ,  DENIES NAUSEA OR VOMITTING OR VISUAL PROBLEMS. NO FEVER .

## 2012-08-08 NOTE — ED Provider Notes (Signed)
Medical screening examination/treatment/procedure(s) were performed by non-physician practitioner and as supervising physician I was immediately available for consultation/collaboration.  Xander Jutras M Meshilem Machuca, MD 08/08/12 0805 

## 2012-08-14 ENCOUNTER — Inpatient Hospital Stay (HOSPITAL_COMMUNITY)
Admission: AD | Admit: 2012-08-14 | Discharge: 2012-08-14 | Disposition: A | Discharge status: Home / Self Care | Payer: Medicaid Other | Source: Ambulatory Visit | Attending: Obstetrics | Admitting: Obstetrics

## 2012-08-14 ENCOUNTER — Encounter (HOSPITAL_COMMUNITY): Payer: Self-pay

## 2012-08-14 ENCOUNTER — Inpatient Hospital Stay (HOSPITAL_COMMUNITY): Payer: Self-pay

## 2012-08-14 DIAGNOSIS — Z3201 Encounter for pregnancy test, result positive: Secondary | ICD-10-CM

## 2012-08-14 DIAGNOSIS — O99891 Other specified diseases and conditions complicating pregnancy: Secondary | ICD-10-CM | POA: Insufficient documentation

## 2012-08-14 DIAGNOSIS — O26891 Other specified pregnancy related conditions, first trimester: Secondary | ICD-10-CM

## 2012-08-14 DIAGNOSIS — R51 Headache: Secondary | ICD-10-CM | POA: Insufficient documentation

## 2012-08-14 DIAGNOSIS — R109 Unspecified abdominal pain: Secondary | ICD-10-CM | POA: Insufficient documentation

## 2012-08-14 LAB — CBC
HCT: 35.8 % — ABNORMAL LOW (ref 36.0–46.0)
Hemoglobin: 11.9 g/dL — ABNORMAL LOW (ref 12.0–15.0)
MCH: 26.7 pg (ref 26.0–34.0)
MCV: 80.3 fL (ref 78.0–100.0)
RBC: 4.46 MIL/uL (ref 3.87–5.11)

## 2012-08-14 LAB — URINALYSIS, ROUTINE W REFLEX MICROSCOPIC
Bilirubin Urine: NEGATIVE
Glucose, UA: NEGATIVE mg/dL
Ketones, ur: 15 mg/dL — AB
Protein, ur: NEGATIVE mg/dL

## 2012-08-14 LAB — WET PREP, GENITAL: Yeast Wet Prep HPF POC: NONE SEEN

## 2012-08-14 LAB — POCT PREGNANCY, URINE: Preg Test, Ur: POSITIVE — AB

## 2012-08-14 LAB — URINE MICROSCOPIC-ADD ON

## 2012-08-14 NOTE — MAU Provider Note (Signed)
Chief Complaint: No chief complaint on file.   First Provider Initiated Contact with Patient 08/14/12 1949     SUBJECTIVE HPI: Laurie Reed is a 27 y.o. [id number] at [redacted]w[redacted]d by LMP who presents to maternity admissions reporting menstrual cramping in early pregnancy.  She also reports h/a.  She has not taken anything for her headache.  She has her 2 children and a niece with her today in MAU with noone else available for childcare. She denies vaginal bleeding, vaginal itching/burning, urinary symptoms, h/a, dizziness, n/v, or fever/chills.    Past Medical History  Diagnosis Date  . Asthma   . Migraine   . Reflux   . Anxiety   . Anxiety   . Environmental allergies   . Acid reflux   . Urinary tract infection   . Depression     no meds, currenly ok  . Ovarian cyst   . Chlamydia   . Miscarriage    Past Surgical History  Procedure Laterality Date  . No past surgeries     History   Social History  . Marital Status: Divorced    Spouse Name: N/A    Number of Children: N/A  . Years of Education: N/A   Occupational History  . Not on file.   Social History Main Topics  . Smoking status: Never Smoker   . Smokeless tobacco: Never Used  . Alcohol Use: No  . Drug Use: No  . Sexually Active: Yes    Birth Control/ Protection: None     Comment: not since last MAU visit   Other Topics Concern  . Not on file   Social History Narrative  . No narrative on file   No current facility-administered medications on file prior to encounter.   Current Outpatient Prescriptions on File Prior to Encounter  Medication Sig Dispense Refill  . cyclobenzaprine (FLEXERIL) 10 MG tablet Take 15 mg by mouth 3 (three) times daily as needed. For muscle spasms      . naproxen (NAPROSYN) 500 MG tablet Take 500 mg by mouth 2 (two) times daily with a meal.       Allergies  Allergen Reactions  . Other Anaphylaxis    "20 different types of trees"  . Latex Hives and Itching  . Zofran Itching    ROS:  Pertinent items in HPI  OBJECTIVE Last menstrual period 07/11/2012. GENERAL: Well-developed, well-nourished female in no acute distress.  HEENT: Normocephalic HEART: normal rate RESP: normal effort ABDOMEN: Soft, non-tender EXTREMITIES: Nontender, no edema NEURO: Alert and oriented Pelvic exam: Cervix pink, visually closed, without lesion, moderate white/gray thin discharge, vaginal walls and external genitalia normal Bimanual exam: Cervix 0/long/high, firm, anterior, neg CMT, uterus nontender, nonenlarged, adnexa without tenderness, enlargement, or mass  LAB RESULTS Results for orders placed during the hospital encounter of 08/14/12 (from the past 24 hour(s))  URINALYSIS, ROUTINE W REFLEX MICROSCOPIC     Status: Abnormal   Collection Time    08/14/12  6:45 PM      Result Value Range   Color, Urine YELLOW  YELLOW   APPearance CLEAR  CLEAR   Specific Gravity, Urine >1.030 (*) 1.005 - 1.030   pH 5.5  5.0 - 8.0   Glucose, UA NEGATIVE  NEGATIVE mg/dL   Hgb urine dipstick MODERATE (*) NEGATIVE   Bilirubin Urine NEGATIVE  NEGATIVE   Ketones, ur 15 (*) NEGATIVE mg/dL   Protein, ur NEGATIVE  NEGATIVE mg/dL   Urobilinogen, UA 0.2  0.0 - 1.0 mg/dL  Nitrite NEGATIVE  NEGATIVE   Leukocytes, UA NEGATIVE  NEGATIVE  URINE MICROSCOPIC-ADD ON     Status: Abnormal   Collection Time    08/14/12  6:45 PM      Result Value Range   Squamous Epithelial / LPF FEW (*) RARE   WBC, UA 0-2  <3 WBC/hpf   RBC / HPF 0-2  <3 RBC/hpf   Bacteria, UA RARE  RARE  POCT PREGNANCY, URINE     Status: Abnormal   Collection Time    08/14/12  6:49 PM      Result Value Range   Preg Test, Ur POSITIVE (*) NEGATIVE  HCG, QUANTITATIVE, PREGNANCY     Status: Abnormal   Collection Time    08/14/12  7:10 PM      Result Value Range   hCG, Beta Chain, Quant, S 635 (*) <5 mIU/mL  CBC     Status: Abnormal   Collection Time    08/14/12  7:10 PM      Result Value Range   WBC 9.5  4.0 - 10.5 K/uL   RBC 4.46  3.87 -  5.11 MIL/uL   Hemoglobin 11.9 (*) 12.0 - 15.0 g/dL   HCT 40.9 (*) 81.1 - 91.4 %   MCV 80.3  78.0 - 100.0 fL   MCH 26.7  26.0 - 34.0 pg   MCHC 33.2  30.0 - 36.0 g/dL   RDW 78.2  95.6 - 21.3 %   Platelets 301  150 - 400 K/uL  WET PREP, GENITAL     Status: Abnormal   Collection Time    08/14/12  7:30 PM      Result Value Range   Yeast Wet Prep HPF POC NONE SEEN  NONE SEEN   Trich, Wet Prep NONE SEEN  NONE SEEN   Clue Cells Wet Prep HPF POC MODERATE (*) NONE SEEN   WBC, Wet Prep HPF POC FEW (*) NONE SEEN    IMAGING US Ob Comp Less 14 Wks  08/14/2012  *RADIOLOGY REPORT*  Clinical Data: Early pregnancy.  OBSTETRIC <14 WK [place] AND TRANSVAGINAL OB US  Technique:  Both transabdominal and transvaginal ultrasound examinations were performed for complete evaluation of the gestation as well as the maternal uterus, adnexal regions, and pelvic cul-de-sac.  Transvaginal technique was performed to assess early pregnancy.  Comparison:  None.  Intrauterine gestational sac:  No gestational sac is identified. No endometrial fluid.  The endometrium is 13 mm in thickness. Yolk sac: Not visualized. Embryo: Not visualized. Cardiac Activity: Not applicable. Heart Rate: Not applicable. bpm  Maternal uterus/adnexae: Ovaries are within normal limits.  There is a 1.6 cm benign appearing right para ovarian cyst.  No free fluid.  IMPRESSION: No gestational sac and no worrisome finding.  This is nonspecific and differential diagnosis includes early pregnancy, spontaneous abortion, and ectopic pregnancy is not entirely excluded.  Serial beta HCG levels are warranted.  Follow-up ultrasound in 1 week is recommended to ensure development of an embryo.   Original Report Authenticated By: Jolaine Click, M.D.    US Ob Transvaginal  08/14/2012  *RADIOLOGY REPORT*  Clinical Data: Early pregnancy.  OBSTETRIC <14 WK [place] AND TRANSVAGINAL OB US  Technique:  Both transabdominal and transvaginal ultrasound examinations were performed for  complete evaluation of the gestation as well as the maternal uterus, adnexal regions, and pelvic cul-de-sac.  Transvaginal technique was performed to assess early pregnancy.  Comparison:  None.  Intrauterine gestational sac:  No gestational sac is identified. No  endometrial fluid.  The endometrium is 13 mm in thickness. Yolk sac: Not visualized. Embryo: Not visualized. Cardiac Activity: Not applicable. Heart Rate: Not applicable. bpm  Maternal uterus/adnexae: Ovaries are within normal limits.  There is a 1.6 cm benign appearing right para ovarian cyst.  No free fluid.  IMPRESSION: No gestational sac and no worrisome finding.  This is nonspecific and differential diagnosis includes early pregnancy, spontaneous abortion, and ectopic pregnancy is not entirely excluded.  Serial beta HCG levels are warranted.  Follow-up ultrasound in 1 week is recommended to ensure development of an embryo.   Original Report Authenticated By: Jolaine Click, M.D.     ASSESSMENT 1. Positive pregnancy test   Headache in pregnancy  PLAN Discharge home Return to MAU in 48 hours for repeat quant hcg Increase water intake for h/a Ectopic precautions given Return to MAU as needed    Medication List    ASK your doctor about these medications       cyclobenzaprine 10 MG tablet  Commonly known as:  FLEXERIL  Take 15 mg by mouth 3 (three) times daily as needed. For muscle spasms     naproxen 500 MG tablet  Commonly known as:  NAPROSYN  Take 500 mg by mouth 2 (two) times daily with a meal.         Sharen Counter Certified Nurse-Midwife 08/14/2012  7:50 PM

## 2012-08-15 LAB — GC/CHLAMYDIA PROBE AMP
CT Probe RNA: NEGATIVE
GC Probe RNA: NEGATIVE

## 2012-08-16 ENCOUNTER — Inpatient Hospital Stay (HOSPITAL_COMMUNITY)
Admission: AD | Admit: 2012-08-16 | Discharge: 2012-08-16 | Disposition: A | Discharge status: Home / Self Care | Payer: Medicaid Other | Source: Ambulatory Visit | Attending: Obstetrics | Admitting: Obstetrics

## 2012-08-16 DIAGNOSIS — O200 Threatened abortion: Secondary | ICD-10-CM

## 2012-08-16 NOTE — MAU Provider Note (Signed)
  History     CSN: [id number]  Arrival date and time: 08/16/12 2142   None     Chief Complaint  Patient presents with  . Follow-up   HPI  Laurie Reed is a 27 y.o. [id number] who is here for repeat HCG. She denies any pain or bleeding.  Past Medical History  Diagnosis Date  . Asthma   . Migraine   . Reflux   . Anxiety   . Anxiety   . Environmental allergies   . Acid reflux   . Urinary tract infection   . Depression     no meds, currenly ok  . Ovarian cyst   . Chlamydia   . Miscarriage     Past Surgical History  Procedure Laterality Date  . No past surgeries      Family History  Problem Relation Age of Onset  . Hypotension Neg Hx   . Anesthesia problems Neg Hx   . Malignant hyperthermia Neg Hx   . Pseudochol deficiency Neg Hx   . Asthma Mother   . Heart disease Mother   . Diabetes Mother   . Hypertension Mother   . Asthma Maternal Aunt   . Hypertension Maternal Aunt   . Diabetes Maternal Aunt   . Asthma Maternal Uncle   . Hypertension Maternal Uncle   . Diabetes Maternal Uncle   . Heart disease Maternal Grandmother   . Diabetes Maternal Grandmother   . Hypertension Maternal Grandmother   . Heart disease Maternal Grandfather   . Diabetes Maternal Grandfather   . Hypertension Maternal Grandfather     History  Substance Use Topics  . Smoking status: Never Smoker   . Smokeless tobacco: Never Used  . Alcohol Use: No    Allergies:  Allergies  Allergen Reactions  . Other Anaphylaxis    "20 different types of trees"  . Latex Hives and Itching  . Zofran Itching    Prescriptions prior to admission  Medication Sig Dispense Refill  . cyclobenzaprine (FLEXERIL) 10 MG tablet Take 15 mg by mouth 3 (three) times daily as needed. For muscle spasms        Review of Systems  Constitutional: Negative for fever and chills.  Gastrointestinal: Negative for nausea, vomiting, abdominal pain and diarrhea.  Genitourinary: Negative for dysuria, urgency and  frequency.  Musculoskeletal: Negative for myalgias.  Neurological: Negative for dizziness.   Physical Exam   Blood pressure 114/87, pulse 98, temperature 98.4 F (36.9 C), temperature source Oral, resp. rate 18, last menstrual period 07/11/2012, SpO2 100.00%.  Physical Exam  Nursing note and vitals reviewed. Constitutional: She is oriented to person, place, and time. She appears well-developed and well-nourished. No distress.  Cardiovascular: Normal rate.   Respiratory: Effort normal.  Neurological: She is alert and oriented to person, place, and time.  Skin: Skin is warm and dry.  Psychiatric: She has a normal mood and affect.    MAU Course  Procedures  Results for orders placed during the hospital encounter of 08/16/12 (from the past 24 hour(s))  HCG, QUANTITATIVE, PREGNANCY     Status: Abnormal   Collection Time    08/16/12  9:45 PM      Result Value Range   hCG, Beta Chain, Quant, S 1397 (*) <5 mIU/mL     Assessment and Plan   1. Threatened abortion in early pregnancy    Repeat ultrasound in 1 week  Tawnya Crook 08/16/2012, 10:29 PM

## 2012-08-16 NOTE — MAU Note (Signed)
Pt here for repeat lab work, denies bleeding or pain

## 2012-08-21 ENCOUNTER — Inpatient Hospital Stay (HOSPITAL_COMMUNITY)
Admission: AD | Admit: 2012-08-21 | Discharge: 2012-08-21 | Disposition: A | Discharge status: Home / Self Care | Payer: Medicaid Other | Source: Ambulatory Visit | Attending: Obstetrics | Admitting: Obstetrics

## 2012-08-21 ENCOUNTER — Encounter (HOSPITAL_COMMUNITY): Payer: Self-pay

## 2012-08-21 DIAGNOSIS — O219 Vomiting of pregnancy, unspecified: Secondary | ICD-10-CM

## 2012-08-21 DIAGNOSIS — O210 Mild hyperemesis gravidarum: Secondary | ICD-10-CM | POA: Insufficient documentation

## 2012-08-21 DIAGNOSIS — R51 Headache: Secondary | ICD-10-CM | POA: Insufficient documentation

## 2012-08-21 LAB — URINALYSIS, ROUTINE W REFLEX MICROSCOPIC
Glucose, UA: NEGATIVE mg/dL
Ketones, ur: NEGATIVE mg/dL
Leukocytes, UA: NEGATIVE
pH: 7 (ref 5.0–8.0)

## 2012-08-21 LAB — URINE MICROSCOPIC-ADD ON

## 2012-08-21 MED ORDER — BUTALBITAL-APAP-CAFFEINE 50-325-40 MG PO TABS: 1.0000 | ORAL_TABLET | Freq: Four times a day (QID) | ORAL | Status: DC | PRN

## 2012-08-21 MED ORDER — ACETAMINOPHEN 325 MG PO TABS
ORAL_TABLET | ORAL | Status: AC
  Administered 2012-08-21: 650 mg via ORAL
  Filled 2012-08-21: qty 2

## 2012-08-21 MED ORDER — TRAMADOL HCL 50 MG PO TABS: 50.0000 mg | ORAL_TABLET | Freq: Four times a day (QID) | ORAL | Status: DC | PRN

## 2012-08-21 MED ORDER — PROMETHAZINE HCL 25 MG PO TABS: 25.0000 mg | ORAL_TABLET | Freq: Four times a day (QID) | ORAL | Status: DC | PRN

## 2012-08-21 MED ORDER — ACETAMINOPHEN 325 MG PO TABS: 650.0000 mg | ORAL_TABLET | Freq: Once | ORAL | Status: AC

## 2012-08-21 NOTE — MAU Note (Signed)
Patient state she has been having nausea and vomiting for a while but getting worse. Now having a headache and she took her blood pressure and it was 130/80. Denies bleeding or vaginal discharge.

## 2012-08-21 NOTE — MAU Provider Note (Signed)
History     CSN: [id number]  Arrival date and time: 08/21/12 1544   First Provider Initiated Contact with Patient 08/21/12 1644      Chief Complaint  Patient presents with  . Emesis During Pregnancy  . Headache   HPI This is a 27 y.o. female at [redacted]w[redacted]d who presents with c/o nausea and vomiting for a week or so. States can keep down fluids but not food. Also c/o headache. Is allergic to Zofran but can tolerate Phenergan. Plans care with Dr Gaynell Face.   OB History   Grav Para Term Preterm Abortions TAB SAB Ect Mult Living   5 2 2  0 2 0 2 0 0 2      Past Medical History  Diagnosis Date  . Asthma   . Migraine   . Reflux   . Anxiety   . Anxiety   . Environmental allergies   . Acid reflux   . Urinary tract infection   . Depression     no meds, currenly ok  . Ovarian cyst   . Chlamydia   . Miscarriage     Past Surgical History  Procedure Laterality Date  . No past surgeries      Family History  Problem Relation Age of Onset  . Hypotension Neg Hx   . Anesthesia problems Neg Hx   . Malignant hyperthermia Neg Hx   . Pseudochol deficiency Neg Hx   . Asthma Mother   . Heart disease Mother   . Diabetes Mother   . Hypertension Mother   . Asthma Maternal Aunt   . Hypertension Maternal Aunt   . Diabetes Maternal Aunt   . Asthma Maternal Uncle   . Hypertension Maternal Uncle   . Diabetes Maternal Uncle   . Heart disease Maternal Grandmother   . Diabetes Maternal Grandmother   . Hypertension Maternal Grandmother   . Heart disease Maternal Grandfather   . Diabetes Maternal Grandfather   . Hypertension Maternal Grandfather     History  Substance Use Topics  . Smoking status: Never Smoker   . Smokeless tobacco: Never Used  . Alcohol Use: No    Allergies:  Allergies  Allergen Reactions  . Other Anaphylaxis    "20 different types of trees"  . Latex Hives and Itching  . Zofran Itching    Prescriptions prior to admission  Medication Sig Dispense Refill  .  cyclobenzaprine (FLEXERIL) 10 MG tablet Take 15 mg by mouth 3 (three) times daily as needed. For muscle spasms        Review of Systems  Constitutional: Positive for malaise/fatigue. Negative for fever and chills.  Gastrointestinal: Positive for nausea and vomiting. Negative for abdominal pain, diarrhea and constipation.  Neurological: Positive for weakness and headaches. Negative for dizziness.   Physical Exam   Blood pressure 108/63, pulse 87, temperature 98 F (36.7 C), temperature source Oral, resp. rate 16, height 5\' 2"  (1.575 m), weight 217 lb 6.4 oz (98.612 kg), last menstrual period 07/11/2012, SpO2 100.00%.  Physical Exam  Constitutional: She is oriented to person, place, and time. She appears well-developed. No distress.  HENT:  Head: Normocephalic.  Cardiovascular: Normal rate, regular rhythm and normal heart sounds.   Respiratory: Effort normal and breath sounds normal. No respiratory distress. She has no wheezes. She has no rales.  GI: Soft. She exhibits no distension. There is no tenderness. There is no rebound and no guarding.  Musculoskeletal: Normal range of motion.  Neurological: She is alert and oriented to person,  place, and time.  Skin: Skin is warm and dry.  Psychiatric: She has a normal mood and affect.   Results for orders placed during the hospital encounter of 08/21/12 (from the past 24 hour(s))  URINALYSIS, ROUTINE W REFLEX MICROSCOPIC     Status: Abnormal   Collection Time    08/21/12  4:20 PM      Result Value Range   Color, Urine YELLOW  YELLOW   APPearance CLEAR  CLEAR   Specific Gravity, Urine 1.020  1.005 - 1.030   pH 7.0  5.0 - 8.0   Glucose, UA NEGATIVE  NEGATIVE mg/dL   Hgb urine dipstick TRACE (*) NEGATIVE   Bilirubin Urine NEGATIVE  NEGATIVE   Ketones, ur NEGATIVE  NEGATIVE mg/dL   Protein, ur NEGATIVE  NEGATIVE mg/dL   Urobilinogen, UA 4.0 (*) 0.0 - 1.0 mg/dL   Nitrite NEGATIVE  NEGATIVE   Leukocytes, UA NEGATIVE  NEGATIVE  URINE  MICROSCOPIC-ADD ON     Status: Abnormal   Collection Time    08/21/12  4:20 PM      Result Value Range   Squamous Epithelial / LPF FEW (*) RARE   RBC / HPF 0-2  <3 RBC/hpf    MAU Course  Procedures  MDM Patient is driving and cannot get a ride home, so cannot use Phenergan or Fioricet.  UA does not show dehydration, so will give Rx for use at home. Will give work note for today, wants to work Advertising account executive.   Assessment and Plan  A:  Pregnancy at [redacted]w[redacted]d       Nausea of pregnancy      Headache  P:  Will discharge home to take meds from pharmacy      Rx Fioricet and Phenergan      Since we gave her Tylenol here (and Fioricet has Tylenol in it) will give small Rx Tramadol for headache use today      Followup as scheduled for [place] and with DR Gaynell Face.  [facility] 08/21/2012, 4:56 PM

## 2012-08-23 ENCOUNTER — Ambulatory Visit (HOSPITAL_COMMUNITY)
Admission: RE | Admit: 2012-08-23 | Discharge: 2012-08-23 | Disposition: A | Discharge status: Home / Self Care | Payer: Medicaid Other | Source: Ambulatory Visit | Attending: Advanced Practice Midwife | Admitting: Advanced Practice Midwife

## 2012-08-23 ENCOUNTER — Inpatient Hospital Stay (HOSPITAL_COMMUNITY)
Admission: AD | Admit: 2012-08-23 | Discharge: 2012-08-23 | Disposition: A | Discharge status: Home / Self Care | Payer: Medicaid Other | Source: Ambulatory Visit | Attending: Obstetrics & Gynecology | Admitting: Obstetrics & Gynecology

## 2012-08-23 DIAGNOSIS — O99891 Other specified diseases and conditions complicating pregnancy: Secondary | ICD-10-CM | POA: Insufficient documentation

## 2012-08-23 DIAGNOSIS — O368390 Maternal care for abnormalities of the fetal heart rate or rhythm, unspecified trimester, not applicable or unspecified: Secondary | ICD-10-CM | POA: Insufficient documentation

## 2012-08-23 DIAGNOSIS — O3680X0 Pregnancy with inconclusive fetal viability, not applicable or unspecified: Secondary | ICD-10-CM | POA: Insufficient documentation

## 2012-08-23 DIAGNOSIS — Z3689 Encounter for other specified antenatal screening: Secondary | ICD-10-CM | POA: Insufficient documentation

## 2012-08-23 DIAGNOSIS — Z3201 Encounter for pregnancy test, result positive: Secondary | ICD-10-CM

## 2012-08-23 DIAGNOSIS — O200 Threatened abortion: Secondary | ICD-10-CM

## 2012-08-23 LAB — HCG, QUANTITATIVE, PREGNANCY: hCG, Beta Chain, Quant, S: 9768 m[IU]/mL — ABNORMAL HIGH (ref <5)

## 2012-08-23 NOTE — MAU Provider Note (Signed)
Ms. Laurie Reed is a 27 y.o. [phone] at [redacted]w[redacted]d who presents to MAU today after follow-up US. She denies bleeding, pain, N/V or fever today.   BP 111/73  Pulse 84  Temp(Src) 98.2 F (36.8 C) (Oral)  Resp 16  SpO2 100%  LMP 07/11/2012 GENERAL: Well-developed, well-nourished female in no acute distress.  HEENT: Normocephalic, atraumatic. LUNGS: effort normal HEART: Regular rate  SKIN: Warm, dry and without erythema PSYCH: Normal mood and affect  Results for orders placed during the hospital encounter of 08/23/12 (from the past 24 hour(s))  HCG, QUANTITATIVE, PREGNANCY     Status: Abnormal   Collection Time    08/23/12  9:05 AM      Result Value Range   hCG, Beta Chain, Quant, S 9768 (*) <5 mIU/mL      A: IUGS and YS without cardiac activity  P: Discharge home Follow-up US on 09/01/12 @ 9:45 am Bleeding precautions discussed Patient may return to MAU as needed or if her condition were to change or worsen.   Freddi Starr, PA-C 08/23/2012 9:54 AM

## 2012-08-23 NOTE — MAU Note (Signed)
Patient to MAU after ultrasound. Denies bleeding or pain.

## 2012-09-01 ENCOUNTER — Ambulatory Visit (HOSPITAL_COMMUNITY): Payer: Self-pay

## 2012-09-01 ENCOUNTER — Ambulatory Visit (HOSPITAL_COMMUNITY)
Admit: 2012-09-01 | Discharge: 2012-09-01 | Disposition: A | Discharge status: Home / Self Care | Payer: Medicaid Other | Attending: Medical | Admitting: Medical

## 2012-09-01 ENCOUNTER — Encounter (HOSPITAL_COMMUNITY): Payer: Self-pay | Admitting: Advanced Practice Midwife

## 2012-09-01 ENCOUNTER — Inpatient Hospital Stay (HOSPITAL_COMMUNITY)
Admission: AD | Admit: 2012-09-01 | Discharge: 2012-09-01 | Disposition: A | Discharge status: Home / Self Care | Payer: Medicaid Other | Source: Ambulatory Visit | Attending: Obstetrics | Admitting: Obstetrics

## 2012-09-01 DIAGNOSIS — Z1389 Encounter for screening for other disorder: Secondary | ICD-10-CM | POA: Insufficient documentation

## 2012-09-01 DIAGNOSIS — O99891 Other specified diseases and conditions complicating pregnancy: Secondary | ICD-10-CM | POA: Insufficient documentation

## 2012-09-01 DIAGNOSIS — O3680X0 Pregnancy with inconclusive fetal viability, not applicable or unspecified: Secondary | ICD-10-CM | POA: Insufficient documentation

## 2012-09-01 DIAGNOSIS — Z3490 Encounter for supervision of normal pregnancy, unspecified, unspecified trimester: Secondary | ICD-10-CM

## 2012-09-01 DIAGNOSIS — Z363 Encounter for antenatal screening for malformations: Secondary | ICD-10-CM | POA: Insufficient documentation

## 2012-09-01 DIAGNOSIS — Z3689 Encounter for other specified antenatal screening: Secondary | ICD-10-CM | POA: Insufficient documentation

## 2012-09-01 MED ORDER — PRENATAL VITAMINS 28-0.8 MG PO TABS: 1.0000 | ORAL_TABLET | Freq: Every day | ORAL | Status: DC

## 2012-09-01 NOTE — MAU Provider Note (Signed)
Laurie Reed is a 27 y.o. [id number] at [redacted]w[redacted]d here for f/u ultrasound for viability. Denies pain or bleeding.  BP 103/77  Pulse 82  Temp(Src) 98.5 F (36.9 C) (Oral)  Resp 16  SpO2 100%  LMP 07/11/2012  Gen: well, no distress  U/S shows 7.3 week IUP, + FHR  A/P: 27 y.o. [id number] at [redacted]w[redacted]d with viable IUP on u/s Follow up for prenatal care with Dr. Gaynell Face as planned Rx sent for PNV

## 2012-09-01 NOTE — MAU Note (Signed)
Patient to MAU after ultrasound for  Viability. Denies pain or bleeding.

## 2012-09-06 ENCOUNTER — Inpatient Hospital Stay (HOSPITAL_COMMUNITY)
Admission: AD | Admit: 2012-09-06 | Discharge: 2012-09-07 | Disposition: A | Discharge status: Home / Self Care | Payer: Medicaid Other | Source: Ambulatory Visit | Attending: Obstetrics | Admitting: Obstetrics

## 2012-09-06 ENCOUNTER — Inpatient Hospital Stay (HOSPITAL_COMMUNITY): Payer: Medicaid Other

## 2012-09-06 ENCOUNTER — Encounter (HOSPITAL_COMMUNITY): Payer: Self-pay | Admitting: *Deleted

## 2012-09-06 DIAGNOSIS — M545 Low back pain, unspecified: Secondary | ICD-10-CM | POA: Insufficient documentation

## 2012-09-06 DIAGNOSIS — N949 Unspecified condition associated with female genital organs and menstrual cycle: Secondary | ICD-10-CM

## 2012-09-06 DIAGNOSIS — O98819 Other maternal infectious and parasitic diseases complicating pregnancy, unspecified trimester: Secondary | ICD-10-CM | POA: Insufficient documentation

## 2012-09-06 DIAGNOSIS — A5901 Trichomonal vulvovaginitis: Secondary | ICD-10-CM | POA: Insufficient documentation

## 2012-09-06 DIAGNOSIS — M7918 Myalgia, other site: Secondary | ICD-10-CM

## 2012-09-06 DIAGNOSIS — R109 Unspecified abdominal pain: Secondary | ICD-10-CM | POA: Insufficient documentation

## 2012-09-06 DIAGNOSIS — H538 Other visual disturbances: Secondary | ICD-10-CM | POA: Insufficient documentation

## 2012-09-06 LAB — URINALYSIS, ROUTINE W REFLEX MICROSCOPIC
Glucose, UA: 100 mg/dL — AB
Ketones, ur: NEGATIVE mg/dL
Leukocytes, UA: NEGATIVE
Nitrite: NEGATIVE
Protein, ur: NEGATIVE mg/dL
Urobilinogen, UA: 1 mg/dL (ref 0.0–1.0)

## 2012-09-06 LAB — URINE MICROSCOPIC-ADD ON

## 2012-09-06 NOTE — MAU Note (Signed)
Pt states she is having sharp pain in her lower back and pelvic area, and blurred vision. Pt states she started having blurred vision yesterday.

## 2012-09-06 NOTE — MAU Note (Signed)
Onset of back and abdominal pain since yesterday with blurry vision.

## 2012-09-06 NOTE — MAU Provider Note (Signed)
Chief Complaint: Back Pain, Abdominal Pain and Blurred Vision   First Provider Initiated Contact with Patient 09/06/12 2307     SUBJECTIVE HPI: Laurie Reed is a 27 y.o. [id number] at [redacted]w[redacted]d by LMP who presents with sharp low abd pain and bilat low back today, blurred vision yesterday, none now. Has not tried anything for the pain. Tolerating POs.    Past Medical History  Diagnosis Date  . Asthma   . Migraine   . Reflux   . Anxiety   . Anxiety   . Environmental allergies   . Acid reflux   . Urinary tract infection   . Depression     no meds, currenly ok  . Ovarian cyst   . Chlamydia   . Miscarriage    OB History   Grav Para Term Preterm Abortions TAB SAB Ect Mult Living   5 2 2  0 2 0 2 0 0 2     # Outc Date GA Lbr Len/2nd Wgt Sex Del Anes PTL Lv   1 TRM      SVD   Yes   2 TRM      SVD   Yes   3 SAB            4 SAB            5 CUR              Past Surgical History  Procedure Laterality Date  . No past surgeries     History   Social History  . Marital Status: Divorced    Spouse Name: N/A    Number of Children: N/A  . Years of Education: N/A   Occupational History  . Not on file.   Social History Main Topics  . Smoking status: Never Smoker   . Smokeless tobacco: Never Used  . Alcohol Use: No  . Drug Use: No  . Sexually Active: Yes    Birth Control/ Protection: None     Comment: not since last MAU visit   Other Topics Concern  . Not on file   Social History Narrative  . No narrative on file   No current facility-administered medications on file prior to encounter.   Current Outpatient Prescriptions on File Prior to Encounter  Medication Sig Dispense Refill  . Prenatal Vit-Fe Fumarate-FA (PRENATAL VITAMINS) 28-0.8 MG TABS Take 1 tablet by mouth daily.  30 tablet  11   Allergies  Allergen Reactions  . Other Anaphylaxis    "20 different types of trees"  . Latex Hives and Itching  . Zofran Itching   ROS: Pertinent items in  HPI  OBJECTIVE Blood pressure 110/44, pulse 86, temperature 98 F (36.7 C), temperature source Oral, resp. rate 18, height 5\' 2"  (1.575 m), weight 98.975 kg (218 lb 3.2 oz), last menstrual period 07/11/2012. GENERAL: Well-developed, well-nourished female in no acute distress.  HEENT: Normocephalic HEART: normal rate RESP: normal effort ABDOMEN: Soft, non-tender. BACK: Mild bilat low back tenderness. No CVAT. EXTREMITIES: Nontender, no edema NEURO: Alert and oriented SPECULUM EXAM: NEFG, small amount of yellow, malodorous discharge, no blood noted, cervix clean. BIMANUAL: cervix closed; UTA uterine size due to body habitus, no adnexal tenderness or masses  LAB RESULTS Results for orders placed during the hospital encounter of 09/06/12 (from the past 24 hour(s))  URINALYSIS, ROUTINE W REFLEX MICROSCOPIC     Status: Abnormal   Collection Time    09/06/12  8:50 PM      Result Value  Range   Color, Urine YELLOW  YELLOW   APPearance CLEAR  CLEAR   Specific Gravity, Urine >1.030 (*) 1.005 - 1.030   pH 6.0  5.0 - 8.0   Glucose, UA 100 (*) NEGATIVE mg/dL   Hgb urine dipstick SMALL (*) NEGATIVE   Bilirubin Urine NEGATIVE  NEGATIVE   Ketones, ur NEGATIVE  NEGATIVE mg/dL   Protein, ur NEGATIVE  NEGATIVE mg/dL   Urobilinogen, UA 1.0  0.0 - 1.0 mg/dL   Nitrite NEGATIVE  NEGATIVE   Leukocytes, UA NEGATIVE  NEGATIVE  URINE MICROSCOPIC-ADD ON     Status: None   Collection Time    09/06/12  8:50 PM      Result Value Range   Squamous Epithelial / LPF RARE  RARE   WBC, UA 0-2  <3 WBC/hpf   RBC / HPF 0-2  <3 RBC/hpf   Urine-Other TRICHOMONAS PRESENT      IMAGING US Ob Comp Less 14 Wks  09/07/2012  *RADIOLOGY REPORT*  Clinical Data: Pregnant, pain  OBSTETRIC <14 WK ULTRASOUND  Technique:  Transabdominal ultrasound was performed for evaluation of the gestation as well as the maternal uterus and adnexal regions.  Comparison:  09/01/2012  Intrauterine gestational sac: Visualized/normal in shape.  Yolk sac: Identified Embryo: Identified Cardiac Activity: Identified Heart Rate: 165 bpm  CRL:  16 mm  8 w  1 d         [place] EDC: 04/17/2013  Maternal uterus/Adnexae: No subchorionic hemorrhage.  Ovaries not visualized.  No free fluid.  IMPRESSION: Single intrauterine gestation with cardiac activity documented. Estimated age of 8 weeks 1 day by crown-rump length.   Original Report Authenticated By: Jearld Lesch, M.D.    MAU COURSE Reassured by US showing viable IUP. Flagyl given for Trich.  Requesting meds for N/V, but will be driving. Reglan given. Tolerating PO's.   ASSESSMENT 1. Trichomonal vaginitis in pregnancy, first trimester   2. Pelvic pain complicating pregnancy, antepartum, first trimester   3. Musculoskeletal pain    PLAN Discharge home Partner needs Tx. No IC x 1 weeks after both are Tx. Always use condoms.   Medication List    TAKE these medications       loratadine 10 MG tablet  Commonly known as:  CLARITIN  Take 10 mg by mouth daily.     Prenatal Vitamins 28-0.8 MG Tabs  Take 1 tablet by mouth daily.       Follow-up Information   Follow up with Kathreen Cosier, MD. (Start prenatal care)    Contact information:   [address] [place] [address] [phone]       Follow up with [facility] [place] MATERNITY ADMISSIONS. (for emergencies)    Contact information:   [address] [place] [address] [phone]     Dorathy Kinsman, [place] 09/06/2012  11:06 PM

## 2012-09-07 DIAGNOSIS — O2390 Unspecified genitourinary tract infection in pregnancy, unspecified trimester: Secondary | ICD-10-CM

## 2012-09-07 LAB — WET PREP, GENITAL
Clue Cells Wet Prep HPF POC: NONE SEEN
Yeast Wet Prep HPF POC: NONE SEEN

## 2012-09-07 MED ORDER — METRONIDAZOLE 500 MG PO TABS
2000.0000 mg | ORAL_TABLET | Freq: Once | ORAL | Status: AC
  Administered 2012-09-07: 2000 mg via ORAL
  Filled 2012-09-07: qty 4

## 2012-09-07 MED ORDER — METOCLOPRAMIDE HCL 10 MG PO TABS
10.0000 mg | ORAL_TABLET | Freq: Once | ORAL | Status: AC
  Administered 2012-09-07: 10 mg via ORAL
  Filled 2012-09-07: qty 1

## 2012-09-27 LAB — OB RESULTS CONSOLE GC/CHLAMYDIA: Gonorrhea: NEGATIVE

## 2012-09-27 LAB — OB RESULTS CONSOLE RUBELLA ANTIBODY, IGM: Rubella: IMMUNE

## 2012-09-27 LAB — OB RESULTS CONSOLE ABO/RH: RH Type: POSITIVE

## 2012-09-27 LAB — OB RESULTS CONSOLE ANTIBODY SCREEN: Antibody Screen: NEGATIVE

## 2012-09-27 LAB — OB RESULTS CONSOLE HIV ANTIBODY (ROUTINE TESTING): HIV: NONREACTIVE

## 2012-10-09 ENCOUNTER — Encounter (HOSPITAL_COMMUNITY): Payer: Self-pay | Admitting: *Deleted

## 2012-10-09 ENCOUNTER — Inpatient Hospital Stay (HOSPITAL_COMMUNITY)
Admission: AD | Admit: 2012-10-09 | Discharge: 2012-10-09 | Disposition: A | Discharge status: Home / Self Care | Payer: Medicaid Other | Source: Ambulatory Visit | Attending: Obstetrics | Admitting: Obstetrics

## 2012-10-09 DIAGNOSIS — R109 Unspecified abdominal pain: Secondary | ICD-10-CM | POA: Insufficient documentation

## 2012-10-09 DIAGNOSIS — J309 Allergic rhinitis, unspecified: Secondary | ICD-10-CM

## 2012-10-09 DIAGNOSIS — J3489 Other specified disorders of nose and nasal sinuses: Secondary | ICD-10-CM | POA: Insufficient documentation

## 2012-10-09 DIAGNOSIS — N949 Unspecified condition associated with female genital organs and menstrual cycle: Secondary | ICD-10-CM | POA: Insufficient documentation

## 2012-10-09 LAB — URINE MICROSCOPIC-ADD ON

## 2012-10-09 LAB — URINALYSIS, ROUTINE W REFLEX MICROSCOPIC
Glucose, UA: 100 mg/dL — AB
Ketones, ur: NEGATIVE mg/dL
Leukocytes, UA: NEGATIVE
Protein, ur: NEGATIVE mg/dL
Urobilinogen, UA: 1 mg/dL (ref 0.0–1.0)

## 2012-10-09 MED ORDER — DIPHENHYDRAMINE HCL 25 MG PO CAPS
25.0000 mg | ORAL_CAPSULE | Freq: Once | ORAL | Status: AC
  Administered 2012-10-09: 25 mg via ORAL
  Filled 2012-10-09: qty 1

## 2012-10-09 MED ORDER — DIPHENHYDRAMINE HCL 25 MG PO TABS: 25.0000 mg | ORAL_TABLET | Freq: Four times a day (QID) | ORAL | Status: DC | PRN

## 2012-10-09 MED ORDER — PSEUDOEPHEDRINE HCL 30 MG PO TABS: 30.0000 mg | ORAL_TABLET | ORAL | Status: DC | PRN

## 2012-10-09 MED ORDER — ALBUTEROL SULFATE HFA 108 (90 BASE) MCG/ACT IN AERS
1.0000 | INHALATION_SPRAY | Freq: Once | RESPIRATORY_TRACT | Status: AC
  Administered 2012-10-09: 2 via RESPIRATORY_TRACT
  Filled 2012-10-09: qty 6.7

## 2012-10-09 MED ORDER — PSEUDOEPHEDRINE HCL 30 MG PO TABS
30.0000 mg | ORAL_TABLET | Freq: Once | ORAL | Status: AC
  Administered 2012-10-09: 30 mg via ORAL
  Filled 2012-10-09: qty 1

## 2012-10-09 NOTE — MAU Provider Note (Signed)
History     CSN: [id number]  Arrival date and time: 10/09/12 1508   First Provider Initiated Contact with Patient 10/09/12 1612      Chief Complaint  Patient presents with  . Abdominal Pain  . Vaginal Pain  . URI   HPI  Laurie Reed is a 27 y.o. [id number] at [id number] a who presents today with nasal congestion, and difficulty breathing. She usually gets allergy shots 3xweek, but she can't take them. Since she has been unable to take them she has had chronic congestion and cough. She states She saw a midwife at Dr. Elsie Stain office who said she could not take the allergy shots. Since then she has been using Claritin without much relief. Her next appointment is on June 11th. She is also here because she has had some bilateral lower abdominal pain.   Past Medical History  Diagnosis Date  . Asthma   . Migraine   . Reflux   . Anxiety   . Anxiety   . Environmental allergies   . Acid reflux   . Urinary tract infection   . Depression     no meds, currenly ok  . Ovarian cyst   . Chlamydia   . Miscarriage     Past Surgical History  Procedure Laterality Date  . No past surgeries      Family History  Problem Relation Age of Onset  . Hypotension Neg Hx   . Anesthesia problems Neg Hx   . Malignant hyperthermia Neg Hx   . Pseudochol deficiency Neg Hx   . Asthma Mother   . Heart disease Mother   . Diabetes Mother   . Hypertension Mother   . Asthma Maternal Aunt   . Hypertension Maternal Aunt   . Diabetes Maternal Aunt   . Asthma Maternal Uncle   . Hypertension Maternal Uncle   . Diabetes Maternal Uncle   . Heart disease Maternal Grandmother   . Diabetes Maternal Grandmother   . Hypertension Maternal Grandmother   . Heart disease Maternal Grandfather   . Diabetes Maternal Grandfather   . Hypertension Maternal Grandfather     History  Substance Use Topics  . Smoking status: Never Smoker   . Smokeless tobacco: Never Used  . Alcohol Use: No    Allergies:   Allergies  Allergen Reactions  . Other Anaphylaxis    "20 different types of trees"  . Latex Hives and Itching  . Zofran Itching    Prescriptions prior to admission  Medication Sig Dispense Refill  . loratadine (CLARITIN) 10 MG tablet Take 10 mg by mouth daily.      . Prenatal Vit-Fe Fumarate-FA (PRENATAL VITAMINS) 28-0.8 MG TABS Take 1 tablet by mouth daily.  30 tablet  11    ROS Physical Exam   Blood pressure 116/64, pulse 86, temperature 99 F (37.2 C), temperature source Oral, resp. rate 18, height 5\' 1"  (1.549 m), weight 97.07 kg (214 lb), last menstrual period 07/11/2012, SpO2 100.00%.  Physical Exam  Nursing note and vitals reviewed. Constitutional: She is oriented to person, place, and time. She appears well-developed and well-nourished. No distress.  Cardiovascular: Normal rate.   Respiratory: Effort normal. No respiratory distress. She has no wheezes.  GI: Soft. There is no tenderness.  Neurological: She is alert and oriented to person, place, and time.  Skin: Skin is warm and dry.  Psychiatric: She has a normal mood and affect.    MAU Course  Procedures  Results for orders placed during the  hospital encounter of 10/09/12 (from the past 24 hour(s))  URINALYSIS, ROUTINE W REFLEX MICROSCOPIC     Status: Abnormal   Collection Time    10/09/12  3:30 PM      Result Value Range   Color, Urine YELLOW  YELLOW   APPearance CLEAR  CLEAR   Specific Gravity, Urine 1.025  1.005 - 1.030   pH 6.5  5.0 - 8.0   Glucose, UA 100 (*) NEGATIVE mg/dL   Hgb urine dipstick TRACE (*) NEGATIVE   Bilirubin Urine NEGATIVE  NEGATIVE   Ketones, ur NEGATIVE  NEGATIVE mg/dL   Protein, ur NEGATIVE  NEGATIVE mg/dL   Urobilinogen, UA 1.0  0.0 - 1.0 mg/dL   Nitrite NEGATIVE  NEGATIVE   Leukocytes, UA NEGATIVE  NEGATIVE  URINE MICROSCOPIC-ADD ON     Status: Abnormal   Collection Time    10/09/12  3:30 PM      Result Value Range   Squamous Epithelial / LPF MANY (*) RARE   WBC, UA 0-2   <3 WBC/hpf   RBC / HPF 0-2  <3 RBC/hpf   Bacteria, UA FEW (*) RARE   Urine-Other MUCOUS PRESENT     1800: Pt reports feeling much better after sudafed and benadryl.   Assessment and Plan  Allergic rhinitis Round ligament pain  Comfort measures discussed Continue albuterol, benadryl and sudafed FU with Dr. Gaynell Face as scheduled.   Tawnya Crook 10/09/2012, 4:14 PM

## 2012-10-09 NOTE — MAU Note (Signed)
Patient states she has had cold/sinus type symptoms for about 3 months with coughing and sneezing and congestion. Started having abdominal and vaginal pain about 3 days ago. Denies bleeding or discharge.

## 2012-12-01 ENCOUNTER — Other Ambulatory Visit (HOSPITAL_COMMUNITY): Payer: Self-pay | Admitting: Obstetrics

## 2012-12-01 DIAGNOSIS — O283 Abnormal ultrasonic finding on antenatal screening of mother: Secondary | ICD-10-CM

## 2012-12-04 ENCOUNTER — Encounter (HOSPITAL_COMMUNITY): Payer: Self-pay | Admitting: *Deleted

## 2012-12-04 ENCOUNTER — Inpatient Hospital Stay (HOSPITAL_COMMUNITY)
Admission: AD | Admit: 2012-12-04 | Discharge: 2012-12-05 | Disposition: A | Discharge status: Home / Self Care | Payer: Medicaid Other | Source: Ambulatory Visit | Attending: Obstetrics | Admitting: Obstetrics

## 2012-12-04 DIAGNOSIS — O2390 Unspecified genitourinary tract infection in pregnancy, unspecified trimester: Secondary | ICD-10-CM | POA: Insufficient documentation

## 2012-12-04 DIAGNOSIS — R109 Unspecified abdominal pain: Secondary | ICD-10-CM | POA: Insufficient documentation

## 2012-12-04 DIAGNOSIS — K5900 Constipation, unspecified: Secondary | ICD-10-CM | POA: Insufficient documentation

## 2012-12-04 DIAGNOSIS — A5901 Trichomonal vulvovaginitis: Secondary | ICD-10-CM | POA: Insufficient documentation

## 2012-12-04 DIAGNOSIS — G43709 Chronic migraine without aura, not intractable, without status migrainosus: Secondary | ICD-10-CM | POA: Insufficient documentation

## 2012-12-04 LAB — COMPREHENSIVE METABOLIC PANEL
Albumin: 2.6 g/dL — ABNORMAL LOW (ref 3.5–5.2)
BUN: 6 mg/dL (ref 6–23)
Calcium: 9.2 mg/dL (ref 8.4–10.5)
Creatinine, Ser: 0.57 mg/dL (ref 0.50–1.10)
Total Bilirubin: 0.1 mg/dL — ABNORMAL LOW (ref 0.3–1.2)
Total Protein: 5.9 g/dL — ABNORMAL LOW (ref 6.0–8.3)

## 2012-12-04 LAB — URINALYSIS, ROUTINE W REFLEX MICROSCOPIC
Nitrite: NEGATIVE
Specific Gravity, Urine: 1.02 (ref 1.005–1.030)
Urobilinogen, UA: 0.2 mg/dL (ref 0.0–1.0)
pH: 6 (ref 5.0–8.0)

## 2012-12-04 LAB — URINE MICROSCOPIC-ADD ON

## 2012-12-04 LAB — CBC
HCT: 31.6 % — ABNORMAL LOW (ref 36.0–46.0)
MCH: 26.9 pg (ref 26.0–34.0)
MCHC: 33.2 g/dL (ref 30.0–36.0)
MCV: 80.8 fL (ref 78.0–100.0)
RDW: 13.5 % (ref 11.5–15.5)

## 2012-12-04 MED ORDER — BUTALBITAL-APAP-CAFFEINE 50-325-40 MG PO TABS
2.0000 | ORAL_TABLET | Freq: Once | ORAL | Status: AC
  Administered 2012-12-04: 2 via ORAL
  Filled 2012-12-04: qty 2

## 2012-12-04 NOTE — MAU Provider Note (Signed)
Chief Complaint: Headache and Abdominal Cramping  First Provider Initiated Contact with Patient 12/04/12 2226     SUBJECTIVE HPI: Laurie Reed is a 27 y.o. [id number] at [redacted]w[redacted]d by LMP who presents with recurrence of chronic migraine and low and upper abd cramping through the day. Usually takes meds for Migraine, but ran out and did not know what she could take in pregnancy. Denies fever, chills, N/V/D, neck pain, URI Sx,  vaginal discharge, VB, or urinary complaints. Dx Trich in MAU 08/2012. Given Flagyl. No IC in 5 months. No BM in past 2 days. Usually daily BMs.   Past Medical History  Diagnosis Date  . Asthma   . Migraine   . Reflux   . Anxiety   . Anxiety   . Environmental allergies   . Acid reflux   . Urinary tract infection   . Depression     no meds, currenly ok  . Ovarian cyst   . Chlamydia   . Miscarriage    OB History   Grav Para Term Preterm Abortions TAB SAB Ect Mult Living   5 2 2  0 2 0 2 0 0 2     # Outc Date GA Lbr Len/2nd Wgt Sex Del Anes PTL Lv   1 TRM      SVD   Yes   2 TRM      SVD   Yes   3 SAB            4 SAB            5 CUR              Past Surgical History  Procedure Laterality Date  . No past surgeries     History   Social History  . Marital Status: Divorced    Spouse Name: N/A    Number of Children: N/A  . Years of Education: N/A   Occupational History  . Not on file.   Social History Main Topics  . Smoking status: Never Smoker   . Smokeless tobacco: Never Used  . Alcohol Use: No  . Drug Use: No  . Sexually Active: Yes    Birth Control/ Protection: None     Comment: not since last MAU visit   Other Topics Concern  . Not on file   Social History Narrative  . No narrative on file   No current facility-administered medications on file prior to encounter.   Current Outpatient Prescriptions on File Prior to Encounter  Medication Sig Dispense Refill  . diphenhydrAMINE (BENADRYL) 25 MG tablet Take 1 tablet (25 mg total) by  mouth every 6 (six) hours as needed for allergies.  30 tablet  0  . Prenatal Vit-Fe Fumarate-FA (PRENATAL VITAMINS) 28-0.8 MG TABS Take 1 tablet by mouth daily.  30 tablet  11  . pseudoephedrine (SUDAFED) 30 MG tablet Take 1 tablet (30 mg total) by mouth every 4 (four) hours as needed for congestion.  30 tablet  0   Allergies  Allergen Reactions  . Other Anaphylaxis    "20 different types of trees"  . Latex Hives and Itching  . Zofran Itching    ROS: Pertinent items in HPI  OBJECTIVE Blood pressure 120/72, pulse 101, temperature 98.5 F (36.9 C), temperature source Oral, resp. rate 18, height 5\' 2"  (1.575 m), weight 96.163 kg (212 lb), last menstrual period 07/11/2012, SpO2 100.00%. GENERAL: Well-developed, well-nourished female in no acute distress.  HEENT: Normocephalic HEART: normal rate RESP: normal effort  ABDOMEN: Mildly distended, mild, generalized abd tenderness. Pos BS x 4. Gravid, S=D.  EXTREMITIES: Nontender, no edema NEURO: Alert and oriented SPECULUM EXAM: NEFG, small amount of yellow-white, malodorous discharge, no blood noted, cervix clean BIMANUAL: cervix closed and long; uterus normal size, no adnexal tenderness or masses FHR 159 by doppler.  LAB RESULTS Results for orders placed during the hospital encounter of 12/04/12 (from the past 24 hour(s))  URINALYSIS, ROUTINE W REFLEX MICROSCOPIC     Status: Abnormal   Collection Time    12/04/12  8:44 PM      Result Value Range   Color, Urine YELLOW  YELLOW   APPearance CLEAR  CLEAR   Specific Gravity, Urine 1.020  1.005 - 1.030   pH 6.0  5.0 - 8.0   Glucose, UA 250 (*) NEGATIVE mg/dL   Hgb urine dipstick TRACE (*) NEGATIVE   Bilirubin Urine NEGATIVE  NEGATIVE   Ketones, ur NEGATIVE  NEGATIVE mg/dL   Protein, ur NEGATIVE  NEGATIVE mg/dL   Urobilinogen, UA 0.2  0.0 - 1.0 mg/dL   Nitrite NEGATIVE  NEGATIVE   Leukocytes, UA SMALL (*) NEGATIVE  URINE MICROSCOPIC-ADD ON     Status: None   Collection Time     12/04/12  8:44 PM      Result Value Range   Squamous Epithelial / LPF RARE  RARE   WBC, UA 3-6  <3 WBC/hpf   RBC / HPF 3-6  <3 RBC/hpf   Bacteria, UA RARE  RARE   Urine-Other TRICHOMONAS PRESENT    CBC     Status: Abnormal   Collection Time    12/04/12 10:50 PM      Result Value Range   WBC 11.7 (*) 4.0 - 10.5 K/uL   RBC 3.91  3.87 - 5.11 MIL/uL   Hemoglobin 10.5 (*) 12.0 - 15.0 g/dL   HCT 82.9 (*) 56.2 - 13.0 %   MCV 80.8  78.0 - 100.0 fL   MCH 26.9  26.0 - 34.0 pg   MCHC 33.2  30.0 - 36.0 g/dL   RDW 86.5  78.4 - 69.6 %   Platelets 262  150 - 400 K/uL  COMPREHENSIVE METABOLIC PANEL     Status: Abnormal   Collection Time    12/04/12 10:50 PM      Result Value Range   Sodium 135  135 - 145 mEq/L   Potassium 3.9  3.5 - 5.1 mEq/L   Chloride 100  96 - 112 mEq/L   CO2 25  19 - 32 mEq/L   Glucose, Bld 84  70 - 99 mg/dL   BUN 6  6 - 23 mg/dL   Creatinine, Ser 2.95  0.50 - 1.10 mg/dL   Calcium 9.2  8.4 - 28.4 mg/dL   Total Protein 5.9 (*) 6.0 - 8.3 g/dL   Albumin 2.6 (*) 3.5 - 5.2 g/dL   AST 10  0 - 37 U/L   ALT 6  0 - 35 U/L   Alkaline Phosphatase 56  39 - 117 U/L   Total Bilirubin 0.1 (*) 0.3 - 1.2 mg/dL   GFR calc non Af Amer >90  >90 mL/min   GFR calc Af Amer >90  >90 mL/min  WET PREP, GENITAL     Status: Abnormal   Collection Time    12/04/12 11:40 PM      Result Value Range   Yeast Wet Prep HPF POC NONE SEEN  NONE SEEN   Trich, Wet Prep FEW (*) NONE  SEEN   Clue Cells Wet Prep HPF POC NONE SEEN  NONE SEEN   WBC, Wet Prep HPF POC FEW (*) NONE SEEN    IMAGING No results found.  MAU COURSE HA resolved w/ Fioricet.  Flagyl given in MAU.   ASSESSMENT 1. Trichomonal vaginitis in pregnancy in second trimester   2. Migraine, chronic, without aura   3. Constipation in pregnancy in second trimester     PLAN Discharge home in stable condition. Pt instructed to get ride or Taxi or wait 4 hour after fioricet dose before driving.  Increase fluids and decrease  stimulation when HA starts. .      Follow-up Information   Follow up with MARSHALL,BERNARD A, MD. (as scheduled or as needed if symptoms worsen)    Contact information:   [address] [place] [address] [phone]       Follow up with [facility] [place] [facility]ERN[facility]MISS[facility]. (As needed if symptoms worsen)    Contact information:   [address] [id number] [place] [address] [phone]       Medication List         butalbital-acetaminophen-caffeine 50-325-40 MG per tablet  Commonly known as:  FIORICET  Take 1-2 tablets by mouth every 6 (six) hours as needed for headache.     diphenhydrAMINE 25 MG tablet  Commonly known as:  BENADRYL  Take 1 tablet (25 mg total) by mouth every 6 (six) hours as needed for allergies.     Prenatal Vitamins 28-0.8 MG Tabs  Take 1 tablet by mouth daily.     pseudoephedrine 30 MG tablet  Commonly known as:  SUDAFED  Take 1 tablet (30 mg total) by mouth every 4 (four) hours as needed for congestion.         [place], CNM 12/05/2012  1:43 AM

## 2012-12-04 NOTE — MAU Note (Signed)
Pt reports she is [redacted] weeks pregnant and she started getting a headache last night and it has worsened throughout the day, reports history of migraines. Did not take any meds today for the headache. Also reports cramping and pressure in lower abd.

## 2012-12-05 ENCOUNTER — Ambulatory Visit (HOSPITAL_COMMUNITY): Payer: Medicaid Other | Attending: Obstetrics

## 2012-12-05 DIAGNOSIS — O2390 Unspecified genitourinary tract infection in pregnancy, unspecified trimester: Secondary | ICD-10-CM

## 2012-12-05 LAB — WET PREP, GENITAL
Clue Cells Wet Prep HPF POC: NONE SEEN
Yeast Wet Prep HPF POC: NONE SEEN

## 2012-12-05 MED ORDER — METRONIDAZOLE 500 MG PO TABS
2000.0000 mg | ORAL_TABLET | Freq: Once | ORAL | Status: AC
  Administered 2012-12-05: 2000 mg via ORAL
  Filled 2012-12-05: qty 4

## 2012-12-05 MED ORDER — BUTALBITAL-APAP-CAFFEINE 50-325-40 MG PO TABS: 1.0000 | ORAL_TABLET | Freq: Four times a day (QID) | ORAL | Status: DC | PRN

## 2012-12-05 NOTE — MAU Note (Addendum)
PT WAS D/C  BUT  WENT TO [facility]   MACHINE  WAS TO RETURN  FOR D/C PAPERS AND RX.    At 0244-  TO HOME

## 2012-12-18 ENCOUNTER — Other Ambulatory Visit (HOSPITAL_COMMUNITY): Payer: Self-pay | Admitting: Obstetrics

## 2012-12-18 ENCOUNTER — Encounter (HOSPITAL_COMMUNITY): Payer: Self-pay

## 2012-12-18 ENCOUNTER — Ambulatory Visit (HOSPITAL_COMMUNITY)
Admission: RE | Admit: 2012-12-18 | Discharge: 2012-12-18 | Disposition: A | Discharge status: Home / Self Care | Payer: Medicaid Other | Source: Ambulatory Visit | Attending: Obstetrics | Admitting: Obstetrics

## 2012-12-18 VITALS — BP 108/69 | HR 100 | Wt 211.2 lb

## 2012-12-18 DIAGNOSIS — Z363 Encounter for antenatal screening for malformations: Secondary | ICD-10-CM | POA: Insufficient documentation

## 2012-12-18 DIAGNOSIS — O283 Abnormal ultrasonic finding on antenatal screening of mother: Secondary | ICD-10-CM

## 2012-12-18 DIAGNOSIS — Z1389 Encounter for screening for other disorder: Secondary | ICD-10-CM | POA: Insufficient documentation

## 2012-12-18 DIAGNOSIS — O358XX0 Maternal care for other (suspected) fetal abnormality and damage, not applicable or unspecified: Secondary | ICD-10-CM | POA: Insufficient documentation

## 2012-12-18 NOTE — Progress Notes (Signed)
Laurie Reed  was seen today for an ultrasound appointment.  See full report in [facility].  Comments: Ms. Ericka Pontiff was seen today due to possible echogenic intracardiac focus on recent ultrasound.  On exam today, this finding is not appreciated.    Impression: Single IUP at 22 4/7 weeks Ultrasound measurements consistent with dates Normal fetal anatomic survey The previously noted echogenic intracardiac focus was not appreciated on today's study No other markers associated with aneuploidy were appreciated Normal amniotic fluid volume  Recommendations: Follow-up ultrasounds as clinically indicated.   Alpha Gula, MD

## 2013-01-06 ENCOUNTER — Encounter (HOSPITAL_COMMUNITY): Payer: Self-pay | Admitting: *Deleted

## 2013-01-06 ENCOUNTER — Inpatient Hospital Stay (HOSPITAL_COMMUNITY)
Admission: AD | Admit: 2013-01-06 | Discharge: 2013-01-06 | Disposition: A | Discharge status: Home / Self Care | Payer: Medicaid Other | Source: Ambulatory Visit | Attending: Obstetrics | Admitting: Obstetrics

## 2013-01-06 DIAGNOSIS — M549 Dorsalgia, unspecified: Secondary | ICD-10-CM

## 2013-01-06 DIAGNOSIS — N390 Urinary tract infection, site not specified: Secondary | ICD-10-CM | POA: Insufficient documentation

## 2013-01-06 DIAGNOSIS — O2342 Unspecified infection of urinary tract in pregnancy, second trimester: Secondary | ICD-10-CM

## 2013-01-06 DIAGNOSIS — O9989 Other specified diseases and conditions complicating pregnancy, childbirth and the puerperium: Secondary | ICD-10-CM

## 2013-01-06 DIAGNOSIS — B373 Candidiasis of vulva and vagina: Secondary | ICD-10-CM | POA: Insufficient documentation

## 2013-01-06 DIAGNOSIS — O2390 Unspecified genitourinary tract infection in pregnancy, unspecified trimester: Secondary | ICD-10-CM | POA: Insufficient documentation

## 2013-01-06 DIAGNOSIS — B3731 Acute candidiasis of vulva and vagina: Secondary | ICD-10-CM | POA: Insufficient documentation

## 2013-01-06 LAB — URINALYSIS, ROUTINE W REFLEX MICROSCOPIC
Bilirubin Urine: NEGATIVE
Glucose, UA: 100 mg/dL — AB
Ketones, ur: NEGATIVE mg/dL
Nitrite: NEGATIVE
Specific Gravity, Urine: 1.02 (ref 1.005–1.030)
pH: 7 (ref 5.0–8.0)

## 2013-01-06 LAB — URINE MICROSCOPIC-ADD ON

## 2013-01-06 MED ORDER — ACETAMINOPHEN-CODEINE #3 300-30 MG PO TABS: 1.0000 | ORAL_TABLET | ORAL | Status: DC | PRN

## 2013-01-06 MED ORDER — NITROFURANTOIN MONOHYD MACRO 100 MG PO CAPS: 100.0000 mg | ORAL_CAPSULE | Freq: Two times a day (BID) | ORAL | Status: DC

## 2013-01-06 NOTE — MAU Note (Signed)
Pt states she has been having back pain since last week. Pts states she was instructed by Dr Elsie Stain office to come in and check for contractions

## 2013-01-06 NOTE — MAU Provider Note (Signed)
Chief Complaint:  Back Pain   First Provider Initiated Contact with Patient 01/06/13 1546      HPI: Laurie Reed is a 27 y.o. [id number] at 31w4dwho presents to maternity admissions reporting back pain described as constant but worsening at times x 2 weeks.  She reports she called Dr Elsie Stain office and has an Rx for Flexeril.  But, the RN in the office told her it may be preterm labor and she should get checked in the MAU and hold off on taking the Flexeril for now.  She is currently using topical medication for a vaginal yeast infection, with last dose last night.  She reports good fetal movement, denies LOF, vaginal bleeding, vaginal itching/burning, urinary symptoms, h/a, dizziness, n/v, or fever/chills.    Past Medical History: Past Medical History  Diagnosis Date  . Asthma   . Migraine   . Reflux   . Anxiety   . Anxiety   . Environmental allergies   . Acid reflux   . Urinary tract infection   . Depression     no meds, currenly ok  . Ovarian cyst   . Chlamydia   . Miscarriage     Past obstetric history: OB History  Gravida Para Term Preterm AB SAB TAB Ectopic Multiple Living  5 2 2  0 2 2 0 0 0 2    # Outcome Date GA Lbr Len/2nd Weight Sex Delivery Anes PTL Lv  5 CUR           4 SAB           3 SAB           2 TRM      SVD   Y  1 TRM      SVD   Y      Past Surgical History: Past Surgical History  Procedure Laterality Date  . No past surgeries      Family History: Family History  Problem Relation Age of Onset  . Hypotension Neg Hx   . Anesthesia problems Neg Hx   . Malignant hyperthermia Neg Hx   . Pseudochol deficiency Neg Hx   . Asthma Mother   . Heart disease Mother   . Diabetes Mother   . Hypertension Mother   . Asthma Maternal Aunt   . Hypertension Maternal Aunt   . Diabetes Maternal Aunt   . Asthma Maternal Uncle   . Hypertension Maternal Uncle   . Diabetes Maternal Uncle   . Heart disease Maternal Grandmother   . Diabetes Maternal  Grandmother   . Hypertension Maternal Grandmother   . Heart disease Maternal Grandfather   . Diabetes Maternal Grandfather   . Hypertension Maternal Grandfather     Social History: History  Substance Use Topics  . Smoking status: Never Smoker   . Smokeless tobacco: Never Used  . Alcohol Use: No    Allergies:  Allergies  Allergen Reactions  . Other Anaphylaxis    "20 different types of trees"  . Latex Hives and Itching  . Zofran Itching    Meds:  Prescriptions prior to admission  Medication Sig Dispense Refill  . diphenhydrAMINE (BENADRYL) 25 MG tablet Take 1 tablet (25 mg total) by mouth every 6 (six) hours as needed for allergies.  30 tablet  0  . Prenatal Vit-Fe Fumarate-FA (PRENATAL VITAMINS) 28-0.8 MG TABS Take 1 tablet by mouth daily.  30 tablet  11  . pseudoephedrine (SUDAFED) 30 MG tablet Take 1 tablet (30 mg total) by mouth  every 4 (four) hours as needed for congestion.  30 tablet  0  . butalbital-acetaminophen-caffeine (FIORICET) 50-325-40 MG per tablet Take 1-2 tablets by mouth every 6 (six) hours as needed for headache.  20 tablet  1    ROS: Pertinent findings in history of present illness.  Physical Exam  Blood pressure 104/59, pulse 91, temperature 98.4 F (36.9 C), temperature source Oral, resp. rate 18, last menstrual period 07/11/2012. GENERAL: Well-developed, well-nourished female in no acute distress.  HEENT: normocephalic HEART: normal rate RESP: normal effort ABDOMEN: Soft, non-tender, gravid appropriate for gestational age BACK: Negative CVA tenderness EXTREMITIES: Nontender, no edema NEURO: alert and oriented  Cervix 0/long/high, firm, posterior    FHT:  Baseline 150 , moderate variability, accelerations present (10x10), no decelerations Contractions: None on toco or to palpation   Labs: Results for orders placed during the hospital encounter of 01/06/13 (from the past 24 hour(s))  URINALYSIS, ROUTINE W REFLEX MICROSCOPIC     Status:  Abnormal   Collection Time    01/06/13  2:45 PM      Result Value Range   Color, Urine YELLOW  YELLOW   APPearance CLEAR  CLEAR   Specific Gravity, Urine 1.020  1.005 - 1.030   pH 7.0  5.0 - 8.0   Glucose, UA 100 (*) NEGATIVE mg/dL   Hgb urine dipstick NEGATIVE  NEGATIVE   Bilirubin Urine NEGATIVE  NEGATIVE   Ketones, ur NEGATIVE  NEGATIVE mg/dL   Protein, ur NEGATIVE  NEGATIVE mg/dL   Urobilinogen, UA 1.0  0.0 - 1.0 mg/dL   Nitrite NEGATIVE  NEGATIVE   Leukocytes, UA SMALL (*) NEGATIVE  URINE MICROSCOPIC-ADD ON     Status: Abnormal   Collection Time    01/06/13  2:45 PM      Result Value Range   Squamous Epithelial / LPF FEW (*) RARE   WBC, UA 3-6  <3 WBC/hpf   Bacteria, UA FEW (*) RARE   Urine-Other MUCOUS PRESENT      Assessment: 1. Back pain complicating pregnancy in second trimester   2. UTI in pregnancy, antepartum, second trimester     Plan: Discharge home PTL precautions and fetal kick counts Take Flexeril as prescribed Tylenol #3 x20 tabs take 1-2 Q4 hours PRN Macrobid 100 mg BID x7 days Rest, heat, ice, abdominal support for back pain F/U with Dr Gaynell Face Return to MAU as needed  Sharen Counter Certified Nurse-Midwife 01/06/2013 3:46 PM

## 2013-01-07 LAB — URINE CULTURE

## 2013-01-24 LAB — OB RESULTS CONSOLE HEPATITIS B SURFACE ANTIGEN: Hepatitis B Surface Ag: NEGATIVE

## 2013-01-29 ENCOUNTER — Encounter (HOSPITAL_COMMUNITY): Payer: Self-pay | Admitting: *Deleted

## 2013-01-29 ENCOUNTER — Inpatient Hospital Stay (HOSPITAL_COMMUNITY)
Admission: AD | Admit: 2013-01-29 | Discharge: 2013-01-29 | Disposition: A | Discharge status: Home / Self Care | Payer: Medicaid Other | Source: Ambulatory Visit | Attending: Obstetrics | Admitting: Obstetrics

## 2013-01-29 DIAGNOSIS — M549 Dorsalgia, unspecified: Secondary | ICD-10-CM | POA: Insufficient documentation

## 2013-01-29 DIAGNOSIS — O4703 False labor before 37 completed weeks of gestation, third trimester: Secondary | ICD-10-CM

## 2013-01-29 DIAGNOSIS — O479 False labor, unspecified: Secondary | ICD-10-CM

## 2013-01-29 DIAGNOSIS — N949 Unspecified condition associated with female genital organs and menstrual cycle: Secondary | ICD-10-CM | POA: Insufficient documentation

## 2013-01-29 DIAGNOSIS — O99891 Other specified diseases and conditions complicating pregnancy: Secondary | ICD-10-CM | POA: Insufficient documentation

## 2013-01-29 LAB — URINALYSIS, ROUTINE W REFLEX MICROSCOPIC
Glucose, UA: 250 mg/dL — AB
Ketones, ur: NEGATIVE mg/dL
Nitrite: NEGATIVE
Specific Gravity, Urine: >1.03 — ABNORMAL HIGH (ref 1.005–1.030)
pH: 6.5 (ref 5.0–8.0)

## 2013-01-29 LAB — URINE MICROSCOPIC-ADD ON

## 2013-01-29 MED ORDER — TERBUTALINE SULFATE 1 MG/ML IJ SOLN
0.2500 mg | Freq: Once | INTRAMUSCULAR | Status: AC
  Administered 2013-01-29: 20:00:00 via SUBCUTANEOUS
  Filled 2013-01-29: qty 1

## 2013-01-29 NOTE — MAU Note (Signed)
Patient states she has been having back and pelvic pain since 9-13. Denies bleeding or leaking and reports good fetal movement.

## 2013-01-29 NOTE — MAU Provider Note (Signed)
History     CSN: [id number]  Arrival date and time: 01/29/13 1742   First Provider Initiated Contact with Patient 01/29/13 1916      Chief Complaint  Patient presents with  . Pelvic Pain  . Back Pain   HPI This is a 27 y.o. female at [redacted]w[redacted]d who presents with c/o lower abdominal cramping and low back pain. Back pain has been there for some time, cramping moreso today. Good FM. No bleeding.   RN Note: Patient states she has been having back and pelvic pain since 9-13. Denies bleeding or leaking and reports good fetal movement.       OB History   Grav Para Term Preterm Abortions TAB SAB Ect Mult Living   5 2 2  0 2 0 2 0 0 2      Past Medical History  Diagnosis Date  . Asthma   . Migraine   . Reflux   . Anxiety   . Anxiety   . Environmental allergies   . Acid reflux   . Urinary tract infection   . Depression     no meds, currenly ok  . Ovarian cyst   . Chlamydia   . Miscarriage     Past Surgical History  Procedure Laterality Date  . No past surgeries    . Wisdom tooth extraction      Family History  Problem Relation Age of Onset  . Hypotension Neg Hx   . Anesthesia problems Neg Hx   . Malignant hyperthermia Neg Hx   . Pseudochol deficiency Neg Hx   . Asthma Mother   . Heart disease Mother   . Diabetes Mother   . Hypertension Mother   . Asthma Maternal Aunt   . Hypertension Maternal Aunt   . Diabetes Maternal Aunt   . Asthma Maternal Uncle   . Hypertension Maternal Uncle   . Diabetes Maternal Uncle   . Heart disease Maternal Grandmother   . Diabetes Maternal Grandmother   . Hypertension Maternal Grandmother   . Heart disease Maternal Grandfather   . Diabetes Maternal Grandfather   . Hypertension Maternal Grandfather     History  Substance Use Topics  . Smoking status: Never Smoker   . Smokeless tobacco: Never Used  . Alcohol Use: No    Allergies:  Allergies  Allergen Reactions  . Other Anaphylaxis    "20 different types of trees"  .  Latex Hives and Itching  . Zofran Itching    Prescriptions prior to admission  Medication Sig Dispense Refill  . acetaminophen-codeine (TYLENOL #3) 300-30 MG per tablet Take 1 tablet by mouth every 4 (four) hours as needed for pain.  15 tablet  0  . cyclobenzaprine (FLEXERIL) 10 MG tablet Take 10 mg by mouth daily as needed for muscle spasms (For back pain.).      Marland Kitchen diphenhydrAMINE (BENADRYL) 25 MG tablet Take 1 tablet (25 mg total) by mouth every 6 (six) hours as needed for allergies.  30 tablet  0  . Prenatal Vit-Fe Fumarate-FA (PRENATAL VITAMINS) 28-0.8 MG TABS Take 1 tablet by mouth daily.  30 tablet  11  . pseudoephedrine (SUDAFED) 30 MG tablet Take 1 tablet (30 mg total) by mouth every 4 (four) hours as needed for congestion.  30 tablet  0    Review of Systems  Constitutional: Negative for fever and malaise/fatigue.  Gastrointestinal: Positive for abdominal pain. Negative for nausea, vomiting, diarrhea and constipation.  Genitourinary: Negative for dysuria.  Musculoskeletal: Positive for back  pain.  Neurological: Negative for dizziness and headaches.   Physical Exam   Blood pressure 124/64, pulse 98, temperature 98.4 F (36.9 C), temperature source Oral, resp. rate 16, height 5\' 2"  (1.575 m), weight 94.53 kg (208 lb 6.4 oz), last menstrual period 07/11/2012, SpO2 100.00%.  Physical Exam  Constitutional: She is oriented to person, place, and time. She appears well-developed and well-nourished. No distress.  Cardiovascular: Normal rate.   Respiratory: Effort normal.  GI: Soft. She exhibits no distension. There is no tenderness. There is no rebound and no guarding.  Genitourinary: Vagina normal and uterus normal. No vaginal discharge found.  Cervix long and closed FHR reactive Uterine irritability noted on toco  Musculoskeletal: Normal range of motion.  Neurological: She is alert and oriented to person, place, and time.  Skin: Skin is warm and dry.  Psychiatric: She has a  normal mood and affect.    MAU Course  Procedures  MDM Given Terbutaline (one dose) for UCs >> stopped after dose  Assessment and Plan  A:  SIUP at [redacted]w[redacted]d        Uterine irritability, preterm, resolved after one dose of Terb      Cervix long and closed  P:  DIscharge home       PTL precautions      Followup with Dr Raynelle Jan 01/29/2013, 7:30 PM

## 2013-02-01 ENCOUNTER — Encounter (HOSPITAL_COMMUNITY): Payer: Self-pay | Admitting: Emergency Medicine

## 2013-02-01 ENCOUNTER — Emergency Department (INDEPENDENT_AMBULATORY_CARE_PROVIDER_SITE_OTHER)
Admission: EM | Admit: 2013-02-01 | Discharge: 2013-02-01 | Disposition: A | Discharge status: Home / Self Care | Payer: Medicaid Other | Source: Home / Self Care

## 2013-02-01 DIAGNOSIS — IMO0001 Reserved for inherently not codable concepts without codable children: Secondary | ICD-10-CM

## 2013-02-01 DIAGNOSIS — L03019 Cellulitis of unspecified finger: Secondary | ICD-10-CM

## 2013-02-01 MED ORDER — CEPHALEXIN 500 MG PO CAPS: 500.0000 mg | ORAL_CAPSULE | Freq: Four times a day (QID) | ORAL | Status: DC

## 2013-02-01 MED ORDER — HYDROCODONE-ACETAMINOPHEN 5-325 MG PO TABS: 1.0000 | ORAL_TABLET | ORAL | Status: DC | PRN

## 2013-02-01 NOTE — ED Provider Notes (Signed)
Medical screening examination/treatment/procedure(s) were performed by non-physician practitioner and as supervising physician I was immediately available for consultation/collaboration.  Farhiya Rosten, M.D.  Sherika Kubicki C Slyvia Lartigue, MD 02/01/13 2154 

## 2013-02-01 NOTE — ED Notes (Signed)
Pt c/o left index finger pain onset last week sxs include: swelling around nail and tenderness Denies: inj/trauma, fevers Alert w/no signs of acute distress.

## 2013-02-01 NOTE — ED Notes (Signed)
Pt. instructed to remove dressing when she gets home and soak it in warm salt water.

## 2013-02-01 NOTE — ED Provider Notes (Signed)
And CSN: [id number]     Arrival date & time 02/01/13  1929 History   First MD Initiated Contact with Patient 02/01/13 2034     Chief Complaint  Patient presents with  . Hand Pain   (Consider location/radiation/quality/duration/timing/severity/associated sxs/prior Treatment) HPI  Past Medical History  Diagnosis Date  . Asthma   . Migraine   . Reflux   . Anxiety   . Anxiety   . Environmental allergies   . Acid reflux   . Urinary tract infection   . Depression     no meds, currenly ok  . Ovarian cyst   . Chlamydia   . Miscarriage    Past Surgical History  Procedure Laterality Date  . No past surgeries    . Wisdom tooth extraction     Family History  Problem Relation Age of Onset  . Hypotension Neg Hx   . Anesthesia problems Neg Hx   . Malignant hyperthermia Neg Hx   . Pseudochol deficiency Neg Hx   . Asthma Mother   . Heart disease Mother   . Diabetes Mother   . Hypertension Mother   . Asthma Maternal Aunt   . Hypertension Maternal Aunt   . Diabetes Maternal Aunt   . Asthma Maternal Uncle   . Hypertension Maternal Uncle   . Diabetes Maternal Uncle   . Heart disease Maternal Grandmother   . Diabetes Maternal Grandmother   . Hypertension Maternal Grandmother   . Heart disease Maternal Grandfather   . Diabetes Maternal Grandfather   . Hypertension Maternal Grandfather    History  Substance Use Topics  . Smoking status: Never Smoker   . Smokeless tobacco: Never Used  . Alcohol Use: No   OB History   Grav Para Term Preterm Abortions TAB SAB Ect Mult Living   5 2 2  0 2 0 2 0 0 2     Review of Systems  Allergies  Other; Latex; and Zofran  Home Medications   Current Outpatient Rx  Name  Route  Sig  Dispense  Refill  . acetaminophen-codeine (TYLENOL #3) 300-30 MG per tablet   Oral   Take 1 tablet by mouth every 4 (four) hours as needed for pain.   15 tablet   0   . cyclobenzaprine (FLEXERIL) 10 MG tablet   Oral   Take 10 mg by mouth daily as  needed for muscle spasms (For back pain.).         Marland Kitchen Prenatal Vit-Fe Fumarate-FA (PRENATAL VITAMINS) 28-0.8 MG TABS   Oral   Take 1 tablet by mouth daily.   30 tablet   11   . cephALEXin (KEFLEX) 500 MG capsule   Oral   Take 1 capsule (500 mg total) by mouth 4 (four) times daily.   28 capsule   0   . diphenhydrAMINE (BENADRYL) 25 MG tablet   Oral   Take 1 tablet (25 mg total) by mouth every 6 (six) hours as needed for allergies.   30 tablet   0   . HYDROcodone-acetaminophen (NORCO/VICODIN) 5-325 MG per tablet   Oral   Take 1 tablet by mouth every 4 (four) hours as needed for pain.   15 tablet   0   . pseudoephedrine (SUDAFED) 30 MG tablet   Oral   Take 1 tablet (30 mg total) by mouth every 4 (four) hours as needed for congestion.   30 tablet   0    BP 112/59  Pulse 88  Temp(Src) 97.6 F (  36.4 C) (Oral)  Resp 16  SpO2 100%  LMP 07/11/2012 Physical Exam  ED Course  INCISION AND DRAINAGE Date/Time: 02/01/2013 8:37 PM Performed by: Phineas Real, Shakaya Bhullar Authorized by: Leslee Home C Consent: Verbal consent obtained. Risks and benefits: risks, benefits and alternatives were discussed Consent given by: patient Patient understanding: patient states understanding of the procedure being performed Patient identity confirmed: verbally with patient Type: abscess Body area: upper extremity Location details: left index finger Local anesthetic: topical anesthetic Patient sedated: no Scalpel size: 11 Incision type: single straight Complexity: simple Drainage: bloody Drainage amount: scant Wound treatment: wound left open Comments: Using a #11 blade tangential incision was made over the most puffy area of the paronychia. Only return was blood. The patient is not satisfied with that particular puncture and requested that I puncture the more medial aspect. With another #11 blade the medial aspect was also tangential he punctured and a scant amount of blood also return. No  purulence seen.   (including critical care time) Labs Review Labs Reviewed - No data to display Imaging Review No results found.  MDM   1. Paronychia of second finger of left hand      Warm salty water for 5-30 minutes at 2-3 times a day if possible for the next 2-3 days. Keflex 500 mg 4 times a day for 7 days Norco 5 mg every 4-6 hours when necessary pain Keep the finger elevated. For any worsening new symptoms problems may return. May also followup with your PCP  Hayden Rasmussen, NP 02/01/13 2059

## 2013-02-15 ENCOUNTER — Encounter (HOSPITAL_COMMUNITY): Payer: Self-pay | Admitting: *Deleted

## 2013-02-15 ENCOUNTER — Inpatient Hospital Stay (HOSPITAL_COMMUNITY)
Admission: AD | Admit: 2013-02-15 | Discharge: 2013-02-15 | Disposition: A | Discharge status: Home / Self Care | Payer: Medicaid Other | Source: Ambulatory Visit | Attending: Obstetrics | Admitting: Obstetrics

## 2013-02-15 DIAGNOSIS — M545 Low back pain, unspecified: Secondary | ICD-10-CM | POA: Insufficient documentation

## 2013-02-15 DIAGNOSIS — M549 Dorsalgia, unspecified: Secondary | ICD-10-CM

## 2013-02-15 DIAGNOSIS — O9989 Other specified diseases and conditions complicating pregnancy, childbirth and the puerperium: Secondary | ICD-10-CM

## 2013-02-15 DIAGNOSIS — O4700 False labor before 37 completed weeks of gestation, unspecified trimester: Secondary | ICD-10-CM | POA: Insufficient documentation

## 2013-02-15 LAB — URINALYSIS, ROUTINE W REFLEX MICROSCOPIC
Bilirubin Urine: NEGATIVE
Glucose, UA: 500 mg/dL — AB
Hgb urine dipstick: NEGATIVE
Ketones, ur: 15 mg/dL — AB
Protein, ur: NEGATIVE mg/dL
Urobilinogen, UA: 4 mg/dL — ABNORMAL HIGH (ref 0.0–1.0)

## 2013-02-15 LAB — FETAL FIBRONECTIN: Fetal Fibronectin: NEGATIVE

## 2013-02-15 MED ORDER — TERBUTALINE SULFATE 1 MG/ML IJ SOLN
0.2500 mg | Freq: Once | INTRAMUSCULAR | Status: AC
  Administered 2013-02-15: 0.25 mg via SUBCUTANEOUS
  Filled 2013-02-15: qty 1

## 2013-02-15 NOTE — MAU Provider Note (Signed)
History     CSN: [id number]  Arrival date and time: 02/15/13 1816   First Provider Initiated Contact with Patient 02/15/13 1909      Chief Complaint  Patient presents with  . Labor Eval   HPI This is a 27 y.o. female at [redacted]w[redacted]d who presents with c/o contactions and low back pain.  States does feel some tightening, but it is almost all low back pain. Has been treated for this before but did not try any Flexeril today. No leaking or bleeding  RN Note; Patient states she is having constant contractions and a mucus discharge. Reports good fetal movement, no bleeding.       OB History   Grav Para Term Preterm Abortions TAB SAB Ect Mult Living   5 2 2  0 2 0 2 0 0 2      Past Medical History  Diagnosis Date  . Asthma   . Migraine   . Reflux   . Anxiety   . Anxiety   . Environmental allergies   . Acid reflux   . Urinary tract infection   . Depression     no meds, currenly ok  . Ovarian cyst   . Chlamydia   . Miscarriage     Past Surgical History  Procedure Laterality Date  . No past surgeries    . Wisdom tooth extraction      Family History  Problem Relation Age of Onset  . Hypotension Neg Hx   . Anesthesia problems Neg Hx   . Malignant hyperthermia Neg Hx   . Pseudochol deficiency Neg Hx   . Asthma Mother   . Heart disease Mother   . Diabetes Mother   . Hypertension Mother   . Asthma Maternal Aunt   . Hypertension Maternal Aunt   . Diabetes Maternal Aunt   . Asthma Maternal Uncle   . Hypertension Maternal Uncle   . Diabetes Maternal Uncle   . Heart disease Maternal Grandmother   . Diabetes Maternal Grandmother   . Hypertension Maternal Grandmother   . Heart disease Maternal Grandfather   . Diabetes Maternal Grandfather   . Hypertension Maternal Grandfather     History  Substance Use Topics  . Smoking status: Never Smoker   . Smokeless tobacco: Never Used  . Alcohol Use: No    Allergies:  Allergies  Allergen Reactions  . Other Anaphylaxis   "20 different types of trees"  . Latex Hives and Itching  . Zofran Itching    Prescriptions prior to admission  Medication Sig Dispense Refill  . acetaminophen-codeine (TYLENOL #3) 300-30 MG per tablet Take 1 tablet by mouth every 4 (four) hours as needed for pain.  15 tablet  0  . cephALEXin (KEFLEX) 500 MG capsule Take 1 capsule (500 mg total) by mouth 4 (four) times daily.  28 capsule  0  . cyclobenzaprine (FLEXERIL) 10 MG tablet Take 10 mg by mouth daily as needed for muscle spasms (For back pain.).      Marland Kitchen Prenatal Vit-Fe Fumarate-FA (PRENATAL VITAMINS) 28-0.8 MG TABS Take 1 tablet by mouth daily.  30 tablet  11    Review of Systems  Constitutional: Negative for fever and malaise/fatigue.  Gastrointestinal: Positive for abdominal pain. Negative for nausea, vomiting, diarrhea and constipation.  Genitourinary:       Mucous discharge   Musculoskeletal: Positive for back pain.   Physical Exam   Blood pressure 105/64, pulse 97, temperature 98.2 F (36.8 C), temperature source Oral, resp. rate 20,  height 5' 1.5" (1.562 m), weight 93.895 kg (207 lb), last menstrual period 07/11/2012, SpO2 100.00%.  Physical Exam  Constitutional: She is oriented to person, place, and time. She appears well-developed and well-nourished. No distress.  Cardiovascular: Normal rate.   Respiratory: Effort normal.  GI: Soft. She exhibits no distension and no mass. There is no tenderness. There is no rebound and no guarding.  Genitourinary: Uterus normal. Vaginal discharge (small mucous, no fluid) found.  Dilation: Fingertip Effacement (%): 20 Station: -3 Exam by:: Artelia Laroche CNM   Musculoskeletal: Normal range of motion.  Neurological: She is alert and oriented to person, place, and time.  Skin: Skin is warm and dry.  Psychiatric: She has a normal mood and affect.  FHR reactive No contractions seen, perhaps a little irritability  MAU Course  Procedures  MDM Given Terbutaline to see if pt feels  subjectively better, since UCs are not visible on monitor  Feels somewhat better after Terbutaline FFn Negative Will have her use Flexeril for pain (has at home)  Assessment and Plan  A:  SIUP at [redacted]w[redacted]d      Low back pain      Stable cervix  P:  Discharge home       Flexeril for back pain, suspect this pain was related to spasm      Followup with Dr Raynelle Jan 02/15/2013, 8:56 PM

## 2013-02-15 NOTE — MAU Note (Signed)
Patient states she is having constant contractions and a mucus discharge. Reports good fetal movement, no bleeding.

## 2013-02-16 ENCOUNTER — Inpatient Hospital Stay (HOSPITAL_COMMUNITY)
Admission: AD | Admit: 2013-02-16 | Discharge: 2013-02-16 | Disposition: A | Discharge status: Home / Self Care | Payer: Medicaid Other | Source: Ambulatory Visit | Attending: Obstetrics | Admitting: Obstetrics

## 2013-02-16 ENCOUNTER — Encounter (HOSPITAL_COMMUNITY): Payer: Self-pay

## 2013-02-16 DIAGNOSIS — O4700 False labor before 37 completed weeks of gestation, unspecified trimester: Secondary | ICD-10-CM | POA: Insufficient documentation

## 2013-02-16 DIAGNOSIS — O479 False labor, unspecified: Secondary | ICD-10-CM

## 2013-02-16 DIAGNOSIS — R109 Unspecified abdominal pain: Secondary | ICD-10-CM | POA: Insufficient documentation

## 2013-02-16 LAB — URINALYSIS, ROUTINE W REFLEX MICROSCOPIC
Bilirubin Urine: NEGATIVE
Glucose, UA: 100 mg/dL — AB
Ketones, ur: NEGATIVE mg/dL
Nitrite: NEGATIVE
Specific Gravity, Urine: 1.02 (ref 1.005–1.030)
pH: 6 (ref 5.0–8.0)

## 2013-02-16 LAB — URINE MICROSCOPIC-ADD ON

## 2013-02-16 NOTE — MAU Note (Signed)
Pt states abdominal pain and vaginal irritation. States when she got out of the car she had a small gush of fluid that ran down the back of her leg. Denies vaginal bleeding. States good FM

## 2013-02-16 NOTE — MAU Provider Note (Signed)
History     CSN: [id number]  Arrival date and time: 02/16/13 4098   First Provider Initiated Contact with Patient 02/16/13 1956      Chief Complaint  Patient presents with  . Rupture of Membranes  . Abdominal Pain   HPI Laurie Reed is 27 y.o. [id number] [redacted]w[redacted]d weeks presenting with abdominal pain and ? ROM.  She had something run down her leg when she got out of car.  She was here last night with same concern.  She is a patient of Dr. Elsie Stain.  She was given an injection of terbutaline a few weeks ago and again last night.  It made her feel better.      Past Medical History  Diagnosis Date  . Asthma   . Migraine   . Reflux   . Anxiety   . Anxiety   . Environmental allergies   . Acid reflux   . Urinary tract infection   . Depression     no meds, currenly ok  . Ovarian cyst   . Chlamydia   . Miscarriage     Past Surgical History  Procedure Laterality Date  . No past surgeries    . Wisdom tooth extraction      Family History  Problem Relation Age of Onset  . Hypotension Neg Hx   . Anesthesia problems Neg Hx   . Malignant hyperthermia Neg Hx   . Pseudochol deficiency Neg Hx   . Asthma Mother   . Heart disease Mother   . Diabetes Mother   . Hypertension Mother   . Asthma Maternal Aunt   . Hypertension Maternal Aunt   . Diabetes Maternal Aunt   . Asthma Maternal Uncle   . Hypertension Maternal Uncle   . Diabetes Maternal Uncle   . Heart disease Maternal Grandmother   . Diabetes Maternal Grandmother   . Hypertension Maternal Grandmother   . Heart disease Maternal Grandfather   . Diabetes Maternal Grandfather   . Hypertension Maternal Grandfather     History  Substance Use Topics  . Smoking status: Never Smoker   . Smokeless tobacco: Never Used  . Alcohol Use: No    Allergies:  Allergies  Allergen Reactions  . Other Anaphylaxis    "20 different types of trees"  . Latex Hives and Itching  . Zofran Itching    Prescriptions prior to  admission  Medication Sig Dispense Refill  . acetaminophen-codeine (TYLENOL #3) 300-30 MG per tablet Take 1 tablet by mouth every 4 (four) hours as needed for pain.  15 tablet  0  . cyclobenzaprine (FLEXERIL) 10 MG tablet Take 10 mg by mouth daily as needed for muscle spasms (For back pain.).      Marland Kitchen Prenatal Vit-Fe Fumarate-FA (PRENATAL VITAMINS) 28-0.8 MG TABS Take 1 tablet by mouth daily.  30 tablet  11    Review of Systems  Constitutional: Negative for fever and chills.  Gastrointestinal: Negative for nausea and vomiting. Abdominal pain: cramping.  Genitourinary: Negative for dysuria, urgency, frequency, hematuria and flank pain.       ? ROM  Neurological: Negative for headaches.   Physical Exam   Blood pressure 110/59, pulse 96, temperature 97.8 F (36.6 C), temperature source Oral, resp. rate 18, height 5' 1.5" (1.562 m), weight 207 lb (93.895 kg), last menstrual period 07/11/2012, SpO2 100.00%.  Physical Exam  Constitutional: She is oriented to person, place, and time. She appears well-developed and well-nourished. No distress.  HENT:  Head: Normocephalic.  Neck: Normal range  of motion.  Cardiovascular: Normal rate.   Respiratory: Effort normal.  GI: Soft. She exhibits no distension and no mass. There is no tenderness. There is no rebound and no guarding.  Genitourinary: There is no rash, tenderness or lesion on the right labia. There is no rash, tenderness or lesion on the left labia. Uterus is enlarged. Uterus is not tender. No tenderness or bleeding around the vagina. No vaginal discharge found.  Cervical exam by Danielle, RN--fingertip and thick.  No change from last night's exam.   There is no pooling of fluid in the vagina  Neurological: She is alert and oriented to person, place, and time.  Skin: Skin is warm and dry.  Psychiatric: She has a normal mood and affect. Her behavior is normal.   Results for orders placed during the hospital encounter of 02/16/13 (from the  past 24 hour(s))  URINALYSIS, ROUTINE W REFLEX MICROSCOPIC     Status: Abnormal   Collection Time    02/16/13  7:30 PM      Result Value Range   Color, Urine YELLOW  YELLOW   APPearance CLEAR  CLEAR   Specific Gravity, Urine 1.020  1.005 - 1.030   pH 6.0  5.0 - 8.0   Glucose, UA 100 (*) NEGATIVE mg/dL   Hgb urine dipstick TRACE (*) NEGATIVE   Bilirubin Urine NEGATIVE  NEGATIVE   Ketones, ur NEGATIVE  NEGATIVE mg/dL   Protein, ur NEGATIVE  NEGATIVE mg/dL   Urobilinogen, UA 0.2  0.0 - 1.0 mg/dL   Nitrite NEGATIVE  NEGATIVE   Leukocytes, UA MODERATE (*) NEGATIVE  URINE MICROSCOPIC-ADD ON     Status: Abnormal   Collection Time    02/16/13  7:30 PM      Result Value Range   Squamous Epithelial / LPF RARE  RARE   WBC, UA 7-10  <3 WBC/hpf   Bacteria, UA FEW (*) RARE   Urine-Other MUCOUS PRESENT    POCT FERN TEST     Status: None   Collection Time    02/16/13  8:13 PM      Result Value Range   POCT Fern Test Negative = intact amniotic membranes     MAU Course  Procedures    NST baseline 145, mod variability, REACTIVE.  3 irregular contractions in 1 hour  MDM Reported to Dr. Clearance Coots who is on call for Dr. Gaynell Face.  Instructed to reassure the patient and to call for increased pain.    Assessment and Plan  A:  Viable pregnancy at [redacted]w[redacted]d gestation      False labor      Rupture of membranes ruled out  P: Instructed to stay well hydrated      Call Dr. Gaynell Face for increased pain or vaginal bleeding, decreased fetal movement     Keep scheduled appointment      KEY,EVE M 02/16/2013, 8:12 PM

## 2013-02-18 LAB — URINE CULTURE: Colony Count: >=100000

## 2013-02-27 ENCOUNTER — Inpatient Hospital Stay (HOSPITAL_COMMUNITY)
Admission: AD | Admit: 2013-02-27 | Discharge: 2013-02-27 | Disposition: A | Discharge status: Home / Self Care | Payer: Medicaid Other | Source: Ambulatory Visit | Attending: Obstetrics | Admitting: Obstetrics

## 2013-02-27 ENCOUNTER — Encounter (HOSPITAL_COMMUNITY): Payer: Self-pay | Admitting: *Deleted

## 2013-02-27 DIAGNOSIS — A088 Other specified intestinal infections: Secondary | ICD-10-CM | POA: Insufficient documentation

## 2013-02-27 DIAGNOSIS — O99891 Other specified diseases and conditions complicating pregnancy: Secondary | ICD-10-CM | POA: Insufficient documentation

## 2013-02-27 DIAGNOSIS — O212 Late vomiting of pregnancy: Secondary | ICD-10-CM | POA: Insufficient documentation

## 2013-02-27 DIAGNOSIS — A084 Viral intestinal infection, unspecified: Secondary | ICD-10-CM

## 2013-02-27 LAB — URINE MICROSCOPIC-ADD ON

## 2013-02-27 LAB — URINALYSIS, ROUTINE W REFLEX MICROSCOPIC
Glucose, UA: 250 mg/dL — AB
Hgb urine dipstick: NEGATIVE
Specific Gravity, Urine: >1.03 — ABNORMAL HIGH (ref 1.005–1.030)
Urobilinogen, UA: 4 mg/dL — ABNORMAL HIGH (ref 0.0–1.0)

## 2013-02-27 MED ORDER — METOCLOPRAMIDE HCL 10 MG PO TABS
5.0000 mg | ORAL_TABLET | Freq: Once | ORAL | Status: AC
  Administered 2013-02-27: 5 mg via ORAL
  Filled 2013-02-27: qty 1

## 2013-02-27 MED ORDER — PROMETHAZINE HCL 12.5 MG PO TABS: 12.5000 mg | ORAL_TABLET | Freq: Four times a day (QID) | ORAL | Status: DC | PRN

## 2013-02-27 NOTE — MAU Provider Note (Signed)
Chief Complaint:  "pressure in lower abdomen and vaginal area. Nauseated.    First Provider Initiated Contact with Patient 02/27/13 1321      HPI: Laurie Reed is a 27 y.o. [id number] at [redacted]w[redacted]d who presents to maternity admissions reporting one-day history of nausea with no vomiting and lower abdominal and pelvic pressure; no antecedent strenuous activity. She is eating very little today and has only been up for a couple of hours.Pressure is slightly worse with moving. Denies irritative vaginitis or vaginal itch. Denies contractions, leakage of fluid or vaginal bleeding. Good fetal movement.   Pregnancy Course: Obese; essentially uncomplicated. States Glucola was normal.   Past Medical History: Past Medical History  Diagnosis Date  . Asthma   . Migraine   . Reflux   . Anxiety   . Anxiety   . Environmental allergies   . Acid reflux   . Urinary tract infection   . Depression     no meds, currenly ok  . Ovarian cyst   . Chlamydia   . Miscarriage     Past obstetric history: OB History  Gravida Para Term Preterm AB SAB TAB Ectopic Multiple Living  5 2 2  0 2 2 0 0 0 2    # Outcome Date GA Lbr Len/2nd Weight Sex Delivery Anes PTL Lv  5 CUR           4 SAB           3 SAB           2 TRM      SVD   Y  1 TRM      SVD   Y      Past Surgical History: Past Surgical History  Procedure Laterality Date  . Wisdom tooth extraction       Family History: Family History  Problem Relation Age of Onset  . Hypotension Neg Hx   . Anesthesia problems Neg Hx   . Malignant hyperthermia Neg Hx   . Pseudochol deficiency Neg Hx   . Asthma Mother   . Heart disease Mother   . Diabetes Mother   . Hypertension Mother   . Asthma Maternal Aunt   . Hypertension Maternal Aunt   . Diabetes Maternal Aunt   . Asthma Maternal Uncle   . Hypertension Maternal Uncle   . Diabetes Maternal Uncle   . Heart disease Maternal Grandmother   . Diabetes Maternal Grandmother   . Hypertension Maternal  Grandmother   . Heart disease Maternal Grandfather   . Diabetes Maternal Grandfather   . Hypertension Maternal Grandfather     Social History: History  Substance Use Topics  . Smoking status: Never Smoker   . Smokeless tobacco: Never Used  . Alcohol Use: No    Allergies:  Allergies  Allergen Reactions  . Other Anaphylaxis    "20 different types of trees"  . Latex Hives and Itching  . Zofran Itching    Meds:  Prescriptions prior to admission  Medication Sig Dispense Refill  . acetaminophen-codeine (TYLENOL #3) 300-30 MG per tablet Take 1 tablet by mouth every 4 (four) hours as needed for pain.  15 tablet  0  . cyclobenzaprine (FLEXERIL) 10 MG tablet Take 10 mg by mouth daily as needed for muscle spasms (For back pain.).      Marland Kitchen diphenhydrAMINE (BENADRYL) 25 MG tablet Take 25 mg by mouth at bedtime as needed for allergies.      . Prenatal Vit-Fe Fumarate-FA (PRENATAL MULTIVITAMIN) TABS tablet  Take 1 tablet by mouth daily at 12 noon.      . pseudoephedrine (SUDAFED) 30 MG tablet Take 30 mg by mouth every 4 (four) hours as needed for congestion.        ROS: Pertinent findings in history of present illness.  Physical Exam  Blood pressure 104/62, pulse 102, temperature 98.2 F (36.8 C), temperature source Oral, resp. rate 18, height 5\' 2"  (1.575 m), weight 92.534 kg (204 lb), last menstrual period 07/11/2012, SpO2 100.00%. GENERAL: Well-developed, well-nourished female in no acute distress.  HEENT: normocephalic HEART: normal rate RESP: normal effort ABDOMEN: Soft, non-tender, gravid appropriate for gestational age EXTREMITIES: Nontender, no edema NEURO: alert and oriented Dilation: Fingertip (outer os 1-2cm) Effacement (%): Thick Cervical Position: Posterior Station: Ballotable Exam by:: D. Savhanna Sliva, CNM     FHT:  Baseline 130 , moderate variability, accelerations present, no decelerations Contractions: none   Labs: Results for orders placed during the hospital encounter  of 02/27/13 (from the past 24 hour(s))  URINALYSIS, ROUTINE W REFLEX MICROSCOPIC     Status: Abnormal   Collection Time    02/27/13  1:00 PM      Result Value Range   Color, Urine YELLOW  YELLOW   APPearance HAZY (*) CLEAR   Specific Gravity, Urine >1.030 (*) 1.005 - 1.030   pH 6.5  5.0 - 8.0   Glucose, UA 250 (*) NEGATIVE mg/dL   Hgb urine dipstick NEGATIVE  NEGATIVE   Bilirubin Urine SMALL (*) NEGATIVE   Ketones, ur 15 (*) NEGATIVE mg/dL   Protein, ur NEGATIVE  NEGATIVE mg/dL   Urobilinogen, UA 4.0 (*) 0.0 - 1.0 mg/dL   Nitrite NEGATIVE  NEGATIVE   Leukocytes, UA TRACE (*) NEGATIVE  URINE MICROSCOPIC-ADD ON     Status: Abnormal   Collection Time    02/27/13  1:00 PM      Result Value Range   Squamous Epithelial / LPF FEW (*) RARE   WBC, UA 0-2  <3 WBC/hpf   Crystals CA OXALATE CRYSTALS (*) NEGATIVE   Urine-Other FEW YEAST      Imaging:  No results found. MAU Course: Reglan 5 mg by mouth given with some alleviation of nausea. Tolerating ginger ale.  Assessment: 1. Viral gastroenteritis     Plan: Discharge home with rest increase fluids and information on black diet given. Rx Phenergan 12.5 mg tablets #10 no refill to use when necessary for nausea and vomiting. Labor precautions and fetal kick counts  Follow-up Information   Follow up with Kathreen Cosier, MD On 03/06/2013.   Specialty:  Obstetrics and Gynecology   Contact information:   [address] [place] [address] [phone]       Danae Orleans, CNM 02/27/2013 1:22 PM

## 2013-02-27 NOTE — MAU Note (Signed)
Feels a lot of pressure in lower stomach and in vaginal area X 1 week. States she feels nauseated today. Stomach and back are cramping. States has not eaten today except for a "few chips."

## 2013-03-27 ENCOUNTER — Encounter (HOSPITAL_COMMUNITY): Payer: Self-pay | Admitting: *Deleted

## 2013-03-27 ENCOUNTER — Inpatient Hospital Stay (HOSPITAL_COMMUNITY)
Admission: AD | Admit: 2013-03-27 | Discharge: 2013-03-27 | Disposition: A | Discharge status: Home / Self Care | Payer: Medicaid Other | Source: Ambulatory Visit | Attending: Obstetrics | Admitting: Obstetrics

## 2013-03-27 DIAGNOSIS — O479 False labor, unspecified: Secondary | ICD-10-CM | POA: Insufficient documentation

## 2013-03-27 NOTE — MAU Note (Signed)
Dr. Gaynell Face notified of pt. Pt may ambulate x 1 hr and recheck cervix.

## 2013-03-27 NOTE — MAU Note (Signed)
Pt returned from walking, monitors applied. 

## 2013-03-27 NOTE — MAU Note (Signed)
Pt up to walk.

## 2013-03-27 NOTE — MAU Note (Signed)
Pt reports contractions every 3-41min.  SVE 1cm today in the office.

## 2013-03-30 LAB — OB RESULTS CONSOLE GC/CHLAMYDIA: Chlamydia: NEGATIVE

## 2013-03-30 LAB — OB RESULTS CONSOLE GBS: GBS: NEGATIVE

## 2013-04-04 ENCOUNTER — Telehealth (HOSPITAL_COMMUNITY): Payer: Self-pay | Admitting: *Deleted

## 2013-04-04 ENCOUNTER — Encounter (HOSPITAL_COMMUNITY): Payer: Self-pay | Admitting: *Deleted

## 2013-04-04 NOTE — Telephone Encounter (Signed)
Preadmission screen  

## 2013-04-10 ENCOUNTER — Inpatient Hospital Stay (HOSPITAL_COMMUNITY)
Admission: RE | Admit: 2013-04-10 | Discharge: 2013-04-12 | DRG: 775 | Disposition: A | Discharge status: Home / Self Care | Payer: Medicaid Other | Source: Ambulatory Visit | Attending: Obstetrics | Admitting: Obstetrics

## 2013-04-10 ENCOUNTER — Encounter (HOSPITAL_COMMUNITY): Payer: Medicaid Other | Admitting: Anesthesiology

## 2013-04-10 ENCOUNTER — Encounter (HOSPITAL_COMMUNITY): Payer: Self-pay

## 2013-04-10 ENCOUNTER — Inpatient Hospital Stay (HOSPITAL_COMMUNITY): Payer: Medicaid Other | Admitting: Anesthesiology

## 2013-04-10 DIAGNOSIS — O99892 Other specified diseases and conditions complicating childbirth: Secondary | ICD-10-CM | POA: Diagnosis present

## 2013-04-10 DIAGNOSIS — Z22330 Carrier of Group B streptococcus: Secondary | ICD-10-CM

## 2013-04-10 LAB — CBC
HCT: 27.7 % — ABNORMAL LOW (ref 36.0–46.0)
Hemoglobin: 8.9 g/dL — ABNORMAL LOW (ref 12.0–15.0)
MCH: 24.9 pg — ABNORMAL LOW (ref 26.0–34.0)
MCHC: 32.1 g/dL (ref 30.0–36.0)
MCV: 77.4 fL — ABNORMAL LOW (ref 78.0–100.0)
Platelets: 178 10*3/uL (ref 150–400)
RDW: 13.9 % (ref 11.5–15.5)

## 2013-04-10 MED ORDER — BUTORPHANOL TARTRATE 1 MG/ML IJ SOLN: 1.0000 mg | INTRAMUSCULAR | Status: DC | PRN

## 2013-04-10 MED ORDER — EPHEDRINE 5 MG/ML INJ
10.0000 mg | INTRAVENOUS | Status: DC | PRN
  Filled 2013-04-10: qty 2
  Filled 2013-04-10: qty 4

## 2013-04-10 MED ORDER — LIDOCAINE HCL (PF) 1 % IJ SOLN
30.0000 mL | INTRAMUSCULAR | Status: DC | PRN
  Filled 2013-04-10 (×2): qty 30

## 2013-04-10 MED ORDER — SENNOSIDES-DOCUSATE SODIUM 8.6-50 MG PO TABS
2.0000 | ORAL_TABLET | ORAL | Status: DC
  Administered 2013-04-10 – 2013-04-11 (×2): 2 via ORAL
  Filled 2013-04-10 (×2): qty 2

## 2013-04-10 MED ORDER — DIPHENHYDRAMINE HCL 25 MG PO CAPS: 25.0000 mg | ORAL_CAPSULE | Freq: Four times a day (QID) | ORAL | Status: DC | PRN

## 2013-04-10 MED ORDER — WITCH HAZEL-GLYCERIN EX PADS: 1.0000 "application " | MEDICATED_PAD | CUTANEOUS | Status: DC | PRN

## 2013-04-10 MED ORDER — LANOLIN HYDROUS EX OINT: TOPICAL_OINTMENT | CUTANEOUS | Status: DC | PRN

## 2013-04-10 MED ORDER — BUTORPHANOL TARTRATE 1 MG/ML IJ SOLN: 1.0000 mg | INTRAMUSCULAR | Status: DC

## 2013-04-10 MED ORDER — OXYTOCIN 40 UNITS IN LACTATED RINGERS INFUSION - SIMPLE MED: 62.5000 mL/h | INTRAVENOUS | Status: DC

## 2013-04-10 MED ORDER — PHENYLEPHRINE 40 MCG/ML (10ML) SYRINGE FOR IV PUSH (FOR BLOOD PRESSURE SUPPORT)
80.0000 ug | PREFILLED_SYRINGE | INTRAVENOUS | Status: DC | PRN
  Filled 2013-04-10: qty 2

## 2013-04-10 MED ORDER — LACTATED RINGERS IV SOLN: 500.0000 mL | INTRAVENOUS | Status: DC | PRN

## 2013-04-10 MED ORDER — IBUPROFEN 600 MG PO TABS
600.0000 mg | ORAL_TABLET | Freq: Four times a day (QID) | ORAL | Status: DC
  Administered 2013-04-10 – 2013-04-12 (×6): 600 mg via ORAL
  Filled 2013-04-10 (×6): qty 1

## 2013-04-10 MED ORDER — FLEET ENEMA 7-19 GM/118ML RE ENEM: 1.0000 | ENEMA | Freq: Once | RECTAL | Status: DC

## 2013-04-10 MED ORDER — LACTATED RINGERS IV SOLN
500.0000 mL | Freq: Once | INTRAVENOUS | Status: AC
  Administered 2013-04-10: 500 mL via INTRAVENOUS

## 2013-04-10 MED ORDER — LACTATED RINGERS IV SOLN
INTRAVENOUS | Status: DC
  Administered 2013-04-10 (×2): 125 mL/h via INTRAVENOUS

## 2013-04-10 MED ORDER — LIDOCAINE HCL (PF) 1 % IJ SOLN
INTRAMUSCULAR | Status: DC | PRN
  Administered 2013-04-10 (×2): 5 mL

## 2013-04-10 MED ORDER — DIPHENHYDRAMINE HCL 50 MG/ML IJ SOLN: 12.5000 mg | INTRAMUSCULAR | Status: DC | PRN

## 2013-04-10 MED ORDER — PROMETHAZINE HCL 25 MG/ML IJ SOLN
25.0000 mg | INTRAMUSCULAR | Status: DC | PRN
  Administered 2013-04-10: 25 mg via INTRAVENOUS
  Filled 2013-04-10: qty 1

## 2013-04-10 MED ORDER — FENTANYL 2.5 MCG/ML BUPIVACAINE 1/10 % EPIDURAL INFUSION (WH - ANES)
14.0000 mL/h | INTRAMUSCULAR | Status: DC | PRN
  Administered 2013-04-10: 14 mL/h via EPIDURAL
  Filled 2013-04-10: qty 125

## 2013-04-10 MED ORDER — ONDANSETRON HCL 4 MG/2ML IJ SOLN: 4.0000 mg | INTRAMUSCULAR | Status: DC | PRN

## 2013-04-10 MED ORDER — PRENATAL MULTIVITAMIN CH
1.0000 | ORAL_TABLET | Freq: Every day | ORAL | Status: DC
  Administered 2013-04-11: 1 via ORAL
  Filled 2013-04-10: qty 1

## 2013-04-10 MED ORDER — OXYTOCIN BOLUS FROM INFUSION
500.0000 mL | INTRAVENOUS | Status: DC
  Administered 2013-04-10: 500 mL via INTRAVENOUS

## 2013-04-10 MED ORDER — ONDANSETRON HCL 4 MG PO TABS: 4.0000 mg | ORAL_TABLET | ORAL | Status: DC | PRN

## 2013-04-10 MED ORDER — PENICILLIN G POTASSIUM 5000000 UNITS IJ SOLR
5.0000 10*6.[IU] | Freq: Once | INTRAVENOUS | Status: AC
  Administered 2013-04-10: 5 10*6.[IU] via INTRAVENOUS
  Filled 2013-04-10: qty 5

## 2013-04-10 MED ORDER — FERROUS SULFATE 325 (65 FE) MG PO TABS
325.0000 mg | ORAL_TABLET | Freq: Two times a day (BID) | ORAL | Status: DC
  Administered 2013-04-11 – 2013-04-12 (×3): 325 mg via ORAL
  Filled 2013-04-10 (×3): qty 1

## 2013-04-10 MED ORDER — EPHEDRINE 5 MG/ML INJ
10.0000 mg | INTRAVENOUS | Status: DC | PRN
  Filled 2013-04-10: qty 2

## 2013-04-10 MED ORDER — CITRIC ACID-SODIUM CITRATE 334-500 MG/5ML PO SOLN: 30.0000 mL | ORAL | Status: DC | PRN

## 2013-04-10 MED ORDER — TERBUTALINE SULFATE 1 MG/ML IJ SOLN: 0.2500 mg | Freq: Once | INTRAMUSCULAR | Status: DC | PRN

## 2013-04-10 MED ORDER — OXYTOCIN 40 UNITS IN LACTATED RINGERS INFUSION - SIMPLE MED
1.0000 m[IU]/min | INTRAVENOUS | Status: DC
  Administered 2013-04-10: 2 m[IU]/min via INTRAVENOUS
  Filled 2013-04-10: qty 1000

## 2013-04-10 MED ORDER — SIMETHICONE 80 MG PO CHEW: 80.0000 mg | CHEWABLE_TABLET | ORAL | Status: DC | PRN

## 2013-04-10 MED ORDER — OXYCODONE-ACETAMINOPHEN 5-325 MG PO TABS: 1.0000 | ORAL_TABLET | ORAL | Status: DC | PRN

## 2013-04-10 MED ORDER — OXYCODONE-ACETAMINOPHEN 5-325 MG PO TABS
1.0000 | ORAL_TABLET | ORAL | Status: DC | PRN
  Administered 2013-04-11 – 2013-04-12 (×3): 1 via ORAL
  Filled 2013-04-10 (×2): qty 1
  Filled 2013-04-10: qty 2

## 2013-04-10 MED ORDER — TETANUS-DIPHTH-ACELL PERTUSSIS 5-2.5-18.5 LF-MCG/0.5 IM SUSP
0.5000 mL | Freq: Once | INTRAMUSCULAR | Status: AC
  Administered 2013-04-11: 0.5 mL via INTRAMUSCULAR
  Filled 2013-04-10: qty 0.5

## 2013-04-10 MED ORDER — ZOLPIDEM TARTRATE 5 MG PO TABS: 5.0000 mg | ORAL_TABLET | Freq: Every evening | ORAL | Status: DC | PRN

## 2013-04-10 MED ORDER — BENZOCAINE-MENTHOL 20-0.5 % EX AERO
1.0000 "application " | INHALATION_SPRAY | CUTANEOUS | Status: DC | PRN
  Administered 2013-04-11: 1 via TOPICAL
  Filled 2013-04-10: qty 56

## 2013-04-10 MED ORDER — DIBUCAINE 1 % RE OINT: 1.0000 "application " | TOPICAL_OINTMENT | RECTAL | Status: DC | PRN

## 2013-04-10 MED ORDER — PENICILLIN G POTASSIUM 5000000 UNITS IJ SOLR
2.5000 10*6.[IU] | INTRAMUSCULAR | Status: DC
  Administered 2013-04-10: 2.5 10*6.[IU] via INTRAVENOUS
  Filled 2013-04-10 (×5): qty 2.5

## 2013-04-10 MED ORDER — ACETAMINOPHEN 325 MG PO TABS: 650.0000 mg | ORAL_TABLET | ORAL | Status: DC | PRN

## 2013-04-10 MED ORDER — IBUPROFEN 600 MG PO TABS
600.0000 mg | ORAL_TABLET | Freq: Four times a day (QID) | ORAL | Status: DC | PRN
  Administered 2013-04-10: 600 mg via ORAL

## 2013-04-10 MED ORDER — PHENYLEPHRINE 40 MCG/ML (10ML) SYRINGE FOR IV PUSH (FOR BLOOD PRESSURE SUPPORT)
80.0000 ug | PREFILLED_SYRINGE | INTRAVENOUS | Status: DC | PRN
  Filled 2013-04-10: qty 10
  Filled 2013-04-10: qty 2

## 2013-04-10 NOTE — Progress Notes (Signed)
Pt moving arm when bp was taken 

## 2013-04-10 NOTE — Anesthesia Procedure Notes (Signed)
Epidural Patient location during procedure: OB Start time: 04/10/2013 12:36 PM  Staffing Anesthesiologist: Brayton Caves Performed by: anesthesiologist   Preanesthetic Checklist Completed: patient identified, site marked, surgical consent, pre-op evaluation, timeout performed, IV checked, risks and benefits discussed and monitors and equipment checked  Epidural Patient position: sitting Prep: site prepped and draped and DuraPrep Patient monitoring: continuous pulse ox and blood pressure Approach: midline Injection technique: LOR air  Needle:  Needle type: Tuohy  Needle gauge: 17 G Needle length: 9 cm and 9 Needle insertion depth: 7 cm Catheter type: closed end flexible Catheter size: 19 Gauge Catheter at skin depth: 12 cm Test dose: negative  Assessment Events: blood not aspirated (.epi), injection not painful, no injection resistance, negative IV test and no paresthesia  Additional Notes Patient identified.  Risk benefits discussed including failed block, incomplete pain control, headache, nerve damage, paralysis, blood pressure changes, nausea, vomiting, reactions to medication both toxic or allergic, and postpartum back pain.  Patient expressed understanding and wished to proceed.  All questions were answered.  Sterile technique used throughout procedure and epidural site dressed with sterile barrier dressing. No paresthesia or other complications noted.The patient did not experience any signs of intravascular injection such as tinnitus or metallic taste in mouth nor signs of intrathecal spread such as rapid motor block. Please see nursing notes for vital signs.

## 2013-04-10 NOTE — Anesthesia Preprocedure Evaluation (Signed)
Anesthesia Evaluation  Patient identified by MRN, date of birth, ID band Patient awake    Reviewed: Allergy & Precautions, H&P , Patient's Chart, lab work & pertinent test results  Airway Mallampati: III TM Distance: >3 FB Neck ROM: full    Dental   Pulmonary asthma ,  breath sounds clear to auscultation        Cardiovascular Rhythm:regular Rate:Normal     Neuro/Psych  Headaches, PSYCHIATRIC DISORDERS Anxiety Depression    GI/Hepatic GERD-  ,  Endo/Other    Renal/GU      Musculoskeletal   Abdominal   Peds  Hematology   Anesthesia Other Findings   Reproductive/Obstetrics (+) Pregnancy                           Anesthesia Physical Anesthesia Plan  ASA: II  Anesthesia Plan: Epidural   Post-op Pain Management:    Induction:   Airway Management Planned:   Additional Equipment:   Intra-op Plan:   Post-operative Plan:   Informed Consent: I have reviewed the patients History and Physical, chart, labs and discussed the procedure including the risks, benefits and alternatives for the proposed anesthesia with the patient or authorized representative who has indicated his/her understanding and acceptance.     Plan Discussed with:   Anesthesia Plan Comments:         Anesthesia Quick Evaluation

## 2013-04-10 NOTE — Progress Notes (Signed)
Asked Dr Gaynell Face concerning pt's Hepatitis B results.  Dr Gaynell Face stated pt first test positive for Hep B and then checked negative on repeat test.  MD stated pt is Hepatist B negative

## 2013-04-10 NOTE — H&P (Signed)
This is Dr. Francoise Ceo dictating the history and physical on  Laurie Reed she is a 27 year old gravida 5 para 2022 at 84 weeks patient still 12-14 2 desires induction GBS positive cervix 2 cm 90% vertex -2-3 station she is on low-dose Pitocin amniotomy performed the fluids clear she is contracting every 3 minutes mildly Past medical history negative Past surgical history negative Social history negative System review noncontributory Physical examination a well-developed female in labor HEENT negative Breasts negative Lungs clear to P&A Heart regular rhythm no murmurs no gallops Abdomen term Pelvic as described above Extremities negative

## 2013-04-10 NOTE — Progress Notes (Signed)
Pt falls asleep easily, unable to lift legs, difficulty turning from side to side.  States "butt is numb"

## 2013-04-10 NOTE — Progress Notes (Signed)
Pt up to bathroom.

## 2013-04-10 NOTE — Progress Notes (Signed)
Paper copy of negative Hep B result on shadow chart

## 2013-04-10 NOTE — Progress Notes (Signed)
Pt throwing up 

## 2013-04-11 LAB — CBC
HCT: 27.6 % — ABNORMAL LOW (ref 36.0–46.0)
Hemoglobin: 8.9 g/dL — ABNORMAL LOW (ref 12.0–15.0)
MCH: 24.9 pg — ABNORMAL LOW (ref 26.0–34.0)
MCHC: 32.2 g/dL (ref 30.0–36.0)
MCV: 77.3 fL — ABNORMAL LOW (ref 78.0–100.0)
RDW: 14.1 % (ref 11.5–15.5)

## 2013-04-11 MED ORDER — PNEUMOCOCCAL VAC POLYVALENT 25 MCG/0.5ML IJ INJ
0.5000 mL | INJECTION | INTRAMUSCULAR | Status: AC
  Administered 2013-04-12: 0.5 mL via INTRAMUSCULAR
  Filled 2013-04-11: qty 0.5

## 2013-04-11 NOTE — Lactation Note (Signed)
This note was copied from the chart of Girl Tekisha Darcey. Lactation Consultation Note  Patient Name: Girl Sherene Plancarte [id number] Date: 04/11/2013 Reason for consult: Initial assessment;Other (Comment) (charting for exclusion)   Maternal Data Formula Feeding for Exclusion: Yes Reason for exclusion: Mother's choice to formula feed on admision (mom has stated she will only bottle/formula feed) Infant to breast within first hour of birth: No (mom's choice to formula feed/bottle) Breastfeeding delayed due to:: Other (comment)  Feeding    LATCH Score/Interventions                      Lactation Tools Discussed/Used     Consult Status Consult Status: Complete    Lynda Rainwater 04/11/2013, 4:24 PM

## 2013-04-11 NOTE — Anesthesia Postprocedure Evaluation (Signed)
Anesthesia Post Note  Patient: Laurie Reed  Procedure(s) Performed: * No procedures listed *  Anesthesia type: Epidural  Patient location: Mother/Baby  Post pain: Pain level controlled  Post assessment: Post-op Vital signs reviewed  Last Vitals:  Filed Vitals:   04/11/13 0752  BP: 103/67  Pulse: 96  Temp: 37.1 C  Resp:     Post vital signs: Reviewed  Level of consciousness:alert  Complications: No apparent anesthesia complications

## 2013-04-11 NOTE — Progress Notes (Signed)
Patient was referred for history of depression/anxiety. * Referral screened out by Clinical Social Worker because none of the following criteria appear to apply:  ~ History of anxiety/depression during this pregnancy, or of post-partum depression.  ~ Diagnosis of anxiety and/or depression within last 6 years, per pt.  ~ History of depression due to pregnancy loss/loss of child  OR * Patient's symptoms currently being treated with medication and/or therapy.  Please contact the Clinical Social Worker if needs arise, or by the patient's request.

## 2013-04-11 NOTE — Progress Notes (Signed)
UR chart review completed.  

## 2013-04-12 MED ORDER — PROMETHAZINE HCL 25 MG RE SUPP
25.0000 mg | Freq: Once | RECTAL | Status: DC
  Filled 2013-04-12: qty 1

## 2013-04-12 NOTE — Discharge Summary (Signed)
Obstetric Discharge Summary Reason for Admission: onset of labor Prenatal Procedures: none Intrapartum Procedures: spontaneous vaginal delivery Postpartum Procedures: none Complications-Operative and Postpartum: none Hemoglobin  Date Value Range Status  04/11/2013 8.9* 12.0 - 15.0 g/dL Final     HCT  Date Value Range Status  04/11/2013 27.6* 36.0 - 46.0 % Final    Physical Exam:  General: alert Lochia: appropriate Uterine Fundus: firm Incision: healing well DVT Evaluation: No evidence of DVT seen on physical exam.  Discharge Diagnoses: Term Pregnancy-delivered  Discharge Information: Date: 04/12/2013 Activity: pelvic rest Diet: routine Medications: Percocet Condition: stable Instructions: refer to practice specific booklet Discharge to: home Follow-up Information   Follow up with Kathreen Cosier, MD.   Specialty:  Obstetrics and Gynecology   Contact information:   [address] [place] [address] [phone]       Newborn Data: Live born female  Birth Weight: 7 lb 10.4 oz (3470 g) APGAR: 8, 9  Home with mother.  Khiya Friese A 04/12/2013, 6:33 AM

## 2013-04-12 NOTE — Progress Notes (Signed)
Patient ID: Laurie Reed, female   DOB: 1986/04/14, 27 y.o.   MRN: [id number] Postpartum day 2 Vital signs normal Fundus firm Legs negative Discharge today

## 2013-05-17 HISTORY — PX: MULTIPLE TOOTH EXTRACTIONS: SHX2053

## 2013-07-08 ENCOUNTER — Emergency Department (HOSPITAL_COMMUNITY)
Admission: EM | Admit: 2013-07-08 | Discharge: 2013-07-08 | Disposition: A | Discharge status: Home / Self Care | Payer: Medicaid Other | Attending: Emergency Medicine | Admitting: Emergency Medicine

## 2013-07-08 ENCOUNTER — Encounter (HOSPITAL_COMMUNITY): Payer: Self-pay | Admitting: Emergency Medicine

## 2013-07-08 DIAGNOSIS — Z8679 Personal history of other diseases of the circulatory system: Secondary | ICD-10-CM | POA: Insufficient documentation

## 2013-07-08 DIAGNOSIS — Z87440 Personal history of urinary (tract) infections: Secondary | ICD-10-CM | POA: Insufficient documentation

## 2013-07-08 DIAGNOSIS — K089 Disorder of teeth and supporting structures, unspecified: Secondary | ICD-10-CM | POA: Insufficient documentation

## 2013-07-08 DIAGNOSIS — Z8742 Personal history of other diseases of the female genital tract: Secondary | ICD-10-CM | POA: Insufficient documentation

## 2013-07-08 DIAGNOSIS — K029 Dental caries, unspecified: Secondary | ICD-10-CM | POA: Insufficient documentation

## 2013-07-08 DIAGNOSIS — K08109 Complete loss of teeth, unspecified cause, unspecified class: Secondary | ICD-10-CM | POA: Insufficient documentation

## 2013-07-08 DIAGNOSIS — Z8619 Personal history of other infectious and parasitic diseases: Secondary | ICD-10-CM | POA: Insufficient documentation

## 2013-07-08 DIAGNOSIS — Z8719 Personal history of other diseases of the digestive system: Secondary | ICD-10-CM | POA: Insufficient documentation

## 2013-07-08 DIAGNOSIS — J45909 Unspecified asthma, uncomplicated: Secondary | ICD-10-CM | POA: Insufficient documentation

## 2013-07-08 DIAGNOSIS — Z8659 Personal history of other mental and behavioral disorders: Secondary | ICD-10-CM | POA: Insufficient documentation

## 2013-07-08 MED ORDER — PENICILLIN V POTASSIUM 500 MG PO TABS
500.0000 mg | ORAL_TABLET | Freq: Once | ORAL | Status: AC
  Administered 2013-07-08: 500 mg via ORAL
  Filled 2013-07-08: qty 1

## 2013-07-08 MED ORDER — HYDROCODONE-ACETAMINOPHEN 5-325 MG PO TABS: 1.0000 | ORAL_TABLET | ORAL | Status: DC | PRN

## 2013-07-08 MED ORDER — PENICILLIN V POTASSIUM 500 MG PO TABS: 500.0000 mg | ORAL_TABLET | Freq: Four times a day (QID) | ORAL | Status: AC

## 2013-07-08 MED ORDER — HYDROCODONE-ACETAMINOPHEN 5-325 MG PO TABS
2.0000 | ORAL_TABLET | Freq: Once | ORAL | Status: AC
  Administered 2013-07-08: 2 via ORAL
  Filled 2013-07-08: qty 2

## 2013-07-08 NOTE — ED Provider Notes (Signed)
Medical screening examination/treatment/procedure(s) were performed by non-physician practitioner and as supervising physician I was immediately available for consultation/collaboration.  EKG Interpretation   None         Rolan BuccoMelanie Meridee Branum, MD 07/08/13 2351

## 2013-07-08 NOTE — Discharge Instructions (Signed)
Please followup with a dentist for continued evaluation and treatment.    Dental Pain A tooth ache may be caused by cavities (tooth decay). Cavities expose the nerve of the tooth to air and hot or cold temperatures. It may come from an infection or abscess (also called a boil or furuncle) around your tooth. It is also often caused by dental caries (tooth decay). This causes the pain you are having. DIAGNOSIS  Your caregiver can diagnose this problem by exam. TREATMENT   If caused by an infection, it may be treated with medications which kill germs (antibiotics) and pain medications as prescribed by your caregiver. Take medications as directed.  Only take over-the-counter or prescription medicines for pain, discomfort, or fever as directed by your caregiver.  Whether the tooth ache today is caused by infection or dental disease, you should see your dentist as soon as possible for further care. SEEK MEDICAL CARE IF: The exam and treatment you received today has been provided on an emergency basis only. This is not a substitute for complete medical or dental care. If your problem worsens or new problems (symptoms) appear, and you are unable to meet with your dentist, call or return to this location. SEEK IMMEDIATE MEDICAL CARE IF:   You have a fever.  You develop redness and swelling of your face, jaw, or neck.  You are unable to open your mouth.  You have severe pain uncontrolled by pain medicine. MAKE SURE YOU:   Understand these instructions.  Will watch your condition.  Will get help right away if you are not doing well or get worse. Document Released: 05/03/2005 Document Revised: 07/26/2011 Document Reviewed: 12/20/2007 [facility] Patient Information 2014 [place].   Dental Caries  Dental caries (also called tooth decay) is the most common oral disease. It can occur at any age, but is more common in children and young adults.  HOW DENTAL CARIES DEVELOPS  The process of  decay begins when bacteria and foods (particularly sugars and starches) combine in your mouth to produce plaque. Plaque is a substance that sticks to the hard, outer surface of a tooth (enamel). The bacteria in plaque produce acids that attack enamel. These acids may also attack the root surface of a tooth (cementum) if it is exposed. Repeated attacks dissolve these surfaces and create holes in the tooth (cavities). If left untreated, the acids destroy the other layers of the tooth.  RISK FACTORS  Frequent sipping of sugary beverages.   Frequent snacking on sugary and starchy foods, especially those that easily get stuck in the teeth.   Poor oral hygiene.   Dry mouth.   Substance abuse such as methamphetamine abuse.   Broken or poor-fitting dental restorations.   Eating disorders.   Gastroesophageal reflux disease (GERD).   Certain radiation treatments to the head and neck. SYMPTOMS In the early stages of dental caries, symptoms are seldom present. Sometimes white, chalky areas may be seen on the enamel or other tooth layers. In later stages, symptoms may include:  Pits and holes on the enamel.  Toothache after sweet, hot, or cold foods or drinks are consumed.  Pain around the tooth.  Swelling around the tooth. DIAGNOSIS  Most of the time, dental caries is detected during a regular dental checkup. A diagnosis is made after a thorough medical and dental history is taken and the surfaces of your teeth are checked for signs of dental caries. Sometimes special instruments, such as lasers, are used to check for dental  caries. Dental X-ray exams may be taken so that areas not visible to the eye (such as between the contact areas of the teeth) can be checked for cavities.  TREATMENT  If dental caries is in its early stages, it may be reversed with a fluoride treatment or an application of a remineralizing agent at the dental office. Thorough brushing and flossing at home is needed  to aid these treatments. If it is in its later stages, treatment depends on the location and extent of tooth destruction:   If a small area of the tooth has been destroyed, the destroyed area will be removed and cavities will be filled with a material such as gold, silver amalgam, or composite resin.   If a large area of the tooth has been destroyed, the destroyed area will be removed and a cap (crown) will be fitted over the remaining tooth structure.   If the center part of the tooth (pulp) is affected, a procedure called a root canal will be needed before a filling or crown can be placed.   If most of the tooth has been destroyed, the tooth may need to be pulled (extracted). HOME CARE INSTRUCTIONS You can prevent, stop, or reverse dental caries at home by practicing good oral hygiene. Good oral hygiene includes:  Thoroughly cleaning your teeth at least twice a day with a toothbrush and dental floss.   Using a fluoride toothpaste. A fluoride mouth rinse may also be used if recommended by your dentist or health care provider.   Restricting the amount of sugary and starchy foods and sugary liquids you consume.   Avoiding frequent snacking on these foods and sipping of these liquids.   Keeping regular visits with a dentist for checkups and cleanings. PREVENTION   Practice good oral hygiene.  Consider a dental sealant. A dental sealant is a coating material that is applied by your dentist to the pits and grooves of teeth. The sealant prevents food from being trapped in them. It may protect the teeth for several years.  Ask about fluoride supplements if you live in a community without fluorinated water or with water that has a low fluoride content. Use fluoride supplements as directed by your dentist or health care provider.  Allow fluoride varnish applications to teeth if directed by your dentist or health care provider. Document Released: 01/23/2002 Document Revised: 01/03/2013  Document Reviewed: 05/05/2012 [facility] Patient Information 2014 [place], [place].

## 2013-07-08 NOTE — ED Provider Notes (Signed)
CSN: [id number]     Arrival date & time 07/08/13  2038 History  This chart was scribed for non-physician practitioner Ivonne Andrew, PA working with Rolan Bucco, MD by Elveria Rising, ED Scribe. This patient was seen in room WTR9/WTR9 and the patient's care was started at 9:18 PM.   Chief Complaint  Patient presents with  . Dental Pain      The history is provided by the patient. No language interpreter was used.   HPI Comments: Laurie Reed is a 28 y.o. female who presents to the Emergency Department complaining of aching bilateral dental pain to back teeth, onset three days ago. Patient reports pain in bottom left and bottom right and top right side. Pain is so severe that patient has not been sleeping well and has not been eating normally due to pain with chewing. Patient has attempted to treat pain in ibuprofen and Tylenol, with no relief. Patient reports that she plans to visit her dentist Monday (the pain began Friday while at work and she was unable to leave). Patient reports rhinorrhea onset before dental pain, but endorses being healthy otherwise. Patient denies having similar pain in her teeth. Patient denies any recent tooth removal. Patient admits to dental cavity fillings years ago.   Past Medical History  Diagnosis Date  . Asthma   . Migraine   . Reflux   . Anxiety   . Anxiety   . Environmental allergies   . Acid reflux   . Urinary tract infection   . Depression     no meds, currenly ok  . Ovarian cyst   . Chlamydia   . Miscarriage    Past Surgical History  Procedure Laterality Date  . Wisdom tooth extraction     Family History  Problem Relation Age of Onset  . Hypotension Neg Hx   . Anesthesia problems Neg Hx   . Malignant hyperthermia Neg Hx   . Pseudochol deficiency Neg Hx   . Asthma Mother   . Heart disease Mother   . Diabetes Mother   . Hypertension Mother   . Asthma Maternal Aunt   . Hypertension Maternal Aunt   . Diabetes Maternal Aunt   .  Asthma Maternal Uncle   . Hypertension Maternal Uncle   . Diabetes Maternal Uncle   . Heart disease Maternal Grandmother   . Diabetes Maternal Grandmother   . Hypertension Maternal Grandmother   . Heart disease Maternal Grandfather   . Diabetes Maternal Grandfather   . Hypertension Maternal Grandfather    History  Substance Use Topics  . Smoking status: Never Smoker   . Smokeless tobacco: Never Used  . Alcohol Use: No   OB History   Grav Para Term Preterm Abortions TAB SAB Ect Mult Living   5 3 3  0 2 0 2 0 0 3     Review of Systems  Constitutional: Negative for fever and chills.  HENT: Positive for dental problem. Negative for sore throat.   Respiratory: Negative for cough.   All other systems reviewed and are negative.      Allergies  Other; Latex; and Zofran  Home Medications   Current Outpatient Rx  Name  Route  Sig  Dispense  Refill  . acetaminophen (TYLENOL) 325 MG tablet   Oral   Take 650 mg by mouth every 6 (six) hours as needed (pain).         Marland Kitchen ibuprofen (ADVIL,MOTRIN) 200 MG tablet   Oral   Take 800 mg by  mouth every 6 (six) hours as needed (pain).          BP 142/98  Pulse 95  Temp(Src) 99 F (37.2 C) (Oral)  Resp 20  Ht 5\' 2"  (1.575 m)  Wt 210 lb (95.255 kg)  BMI 38.40 kg/m2  SpO2 100% Physical Exam  Nursing note and vitals reviewed. Constitutional: She is oriented to person, place, and time. She appears well-developed and well-nourished. No distress.  HENT:  Head: Normocephalic and atraumatic.  Right Ear: External ear normal.  Left Ear: External ear normal.  Mouth/Throat:    Dental caries present.  Posterior portion of left lower 2nd molar.  Pain to percussion over tooth.  No swelling of gums. No swelling under tongue.  No signs for ludwigs angina.  Eyes: EOM are normal.  Neck: Normal range of motion. Neck supple. No tracheal deviation present.  Cardiovascular: Normal rate, regular rhythm and normal heart sounds.   No murmur  heard. Pulmonary/Chest: Effort normal. No respiratory distress. She has no wheezes.  Musculoskeletal: Normal range of motion.  Lymphadenopathy:    She has no cervical adenopathy.  Neurological: She is alert and oriented to person, place, and time.  Skin: Skin is warm and dry.  Psychiatric: She has a normal mood and affect. Her behavior is normal.    ED Course  Procedures   DIAGNOSTIC STUDIES: Oxygen Saturation is 100% on room air, normal by my interpretation.    COORDINATION OF CARE: 9:22 PM- Will prescribed antibiotic and norco. Discussed need for dentist follow up.  Pt advised of plan for treatment and pt agrees.      MDM   Final diagnoses:  Pain due to dental caries   I personally performed the services described in this documentation, which was scribed in my presence. The recorded information has been reviewed and is accurate.    Angus Sellereter S Akai Dollard, PA-C 07/08/13 2153

## 2013-07-08 NOTE — ED Notes (Signed)
Pt reports bilateral dental pain to her back teeth x 3 days.

## 2013-07-31 ENCOUNTER — Encounter (HOSPITAL_COMMUNITY): Payer: Self-pay | Admitting: Emergency Medicine

## 2013-07-31 ENCOUNTER — Emergency Department (HOSPITAL_COMMUNITY)
Admission: EM | Admit: 2013-07-31 | Discharge: 2013-07-31 | Disposition: A | Discharge status: Home / Self Care | Payer: Medicaid Other | Attending: Emergency Medicine | Admitting: Emergency Medicine

## 2013-07-31 DIAGNOSIS — Z8679 Personal history of other diseases of the circulatory system: Secondary | ICD-10-CM | POA: Insufficient documentation

## 2013-07-31 DIAGNOSIS — Z8659 Personal history of other mental and behavioral disorders: Secondary | ICD-10-CM | POA: Insufficient documentation

## 2013-07-31 DIAGNOSIS — Z91040 Latex allergy status: Secondary | ICD-10-CM | POA: Insufficient documentation

## 2013-07-31 DIAGNOSIS — T148XXA Other injury of unspecified body region, initial encounter: Secondary | ICD-10-CM

## 2013-07-31 DIAGNOSIS — Y9389 Activity, other specified: Secondary | ICD-10-CM | POA: Insufficient documentation

## 2013-07-31 DIAGNOSIS — IMO0002 Reserved for concepts with insufficient information to code with codable children: Secondary | ICD-10-CM | POA: Insufficient documentation

## 2013-07-31 DIAGNOSIS — Z87440 Personal history of urinary (tract) infections: Secondary | ICD-10-CM | POA: Insufficient documentation

## 2013-07-31 DIAGNOSIS — Z8719 Personal history of other diseases of the digestive system: Secondary | ICD-10-CM | POA: Insufficient documentation

## 2013-07-31 DIAGNOSIS — Z8619 Personal history of other infectious and parasitic diseases: Secondary | ICD-10-CM | POA: Insufficient documentation

## 2013-07-31 DIAGNOSIS — Z8742 Personal history of other diseases of the female genital tract: Secondary | ICD-10-CM | POA: Insufficient documentation

## 2013-07-31 DIAGNOSIS — Y92410 Unspecified street and highway as the place of occurrence of the external cause: Secondary | ICD-10-CM | POA: Insufficient documentation

## 2013-07-31 DIAGNOSIS — J45909 Unspecified asthma, uncomplicated: Secondary | ICD-10-CM | POA: Insufficient documentation

## 2013-07-31 MED ORDER — NAPROXEN 500 MG PO TABS: 500.0000 mg | ORAL_TABLET | Freq: Two times a day (BID) | ORAL | Status: DC
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
## 2013-07-31 NOTE — Discharge Instructions (Signed)
Please call your doctor for a followup appointment within 24-48 hours. When you talk to your doctor please let them know that you were seen in the emergency department and have them acquire all of your records so that they can discuss the findings with you and formulate a treatment plan to fully care for your new and ongoing problems.  Naprosyn twice daily for pain
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
## 2013-08-01 ENCOUNTER — Encounter (HOSPITAL_COMMUNITY): Payer: Self-pay | Admitting: Emergency Medicine

## 2013-08-01 ENCOUNTER — Emergency Department (HOSPITAL_COMMUNITY)
Admission: EM | Admit: 2013-08-01 | Discharge: 2013-08-01 | Disposition: A | Discharge status: Home / Self Care | Payer: Medicaid Other | Attending: Emergency Medicine | Admitting: Emergency Medicine

## 2013-08-01 DIAGNOSIS — Z87440 Personal history of urinary (tract) infections: Secondary | ICD-10-CM | POA: Insufficient documentation

## 2013-08-01 DIAGNOSIS — Z8742 Personal history of other diseases of the female genital tract: Secondary | ICD-10-CM | POA: Insufficient documentation

## 2013-08-01 DIAGNOSIS — Z8719 Personal history of other diseases of the digestive system: Secondary | ICD-10-CM | POA: Insufficient documentation

## 2013-08-01 DIAGNOSIS — Z8659 Personal history of other mental and behavioral disorders: Secondary | ICD-10-CM | POA: Insufficient documentation

## 2013-08-01 DIAGNOSIS — Z791 Long term (current) use of non-steroidal anti-inflammatories (NSAID): Secondary | ICD-10-CM | POA: Insufficient documentation

## 2013-08-01 DIAGNOSIS — Z8619 Personal history of other infectious and parasitic diseases: Secondary | ICD-10-CM | POA: Insufficient documentation

## 2013-08-01 DIAGNOSIS — Z91040 Latex allergy status: Secondary | ICD-10-CM | POA: Insufficient documentation

## 2013-08-01 DIAGNOSIS — Z8679 Personal history of other diseases of the circulatory system: Secondary | ICD-10-CM | POA: Insufficient documentation

## 2013-08-01 DIAGNOSIS — G8911 Acute pain due to trauma: Secondary | ICD-10-CM | POA: Insufficient documentation

## 2013-08-01 DIAGNOSIS — M549 Dorsalgia, unspecified: Secondary | ICD-10-CM | POA: Insufficient documentation

## 2013-08-01 DIAGNOSIS — J45909 Unspecified asthma, uncomplicated: Secondary | ICD-10-CM | POA: Insufficient documentation

## 2013-08-01 MED ORDER — METHOCARBAMOL 500 MG PO TABS
500.0000 mg | ORAL_TABLET | Freq: Two times a day (BID) | ORAL | Status: DC
Start: 2013-08-01 — End: 2013-09-26

## 2013-08-01 MED ORDER — LIDOCAINE 5 % EX PTCH: 1.0000 | MEDICATED_PATCH | CUTANEOUS | Status: DC
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
## 2013-08-02 NOTE — ED Provider Notes (Signed)
Medical screening examination/treatment/procedure(s) were performed by non-physician practitioner and as supervising physician I was immediately available for consultation/collaboration.    Shadow Stiggers D Darely Becknell, MD 08/02/13 0613 

## 2013-09-26 ENCOUNTER — Emergency Department (HOSPITAL_COMMUNITY)
Admission: EM | Admit: 2013-09-26 | Discharge: 2013-09-26 | Disposition: A | Discharge status: Home / Self Care | Payer: No Typology Code available for payment source | Attending: Emergency Medicine | Admitting: Emergency Medicine

## 2013-09-26 ENCOUNTER — Emergency Department (HOSPITAL_COMMUNITY): Payer: No Typology Code available for payment source

## 2013-09-26 ENCOUNTER — Encounter (HOSPITAL_COMMUNITY): Payer: Self-pay | Admitting: Emergency Medicine

## 2013-09-26 DIAGNOSIS — Z8669 Personal history of other diseases of the nervous system and sense organs: Secondary | ICD-10-CM | POA: Insufficient documentation

## 2013-09-26 DIAGNOSIS — Z8659 Personal history of other mental and behavioral disorders: Secondary | ICD-10-CM | POA: Diagnosis not present

## 2013-09-26 DIAGNOSIS — S0993XA Unspecified injury of face, initial encounter: Secondary | ICD-10-CM | POA: Diagnosis present

## 2013-09-26 DIAGNOSIS — Z91040 Latex allergy status: Secondary | ICD-10-CM | POA: Insufficient documentation

## 2013-09-26 DIAGNOSIS — S139XXA Sprain of joints and ligaments of unspecified parts of neck, initial encounter: Secondary | ICD-10-CM | POA: Diagnosis not present

## 2013-09-26 DIAGNOSIS — Y92410 Unspecified street and highway as the place of occurrence of the external cause: Secondary | ICD-10-CM | POA: Diagnosis not present

## 2013-09-26 DIAGNOSIS — Z87440 Personal history of urinary (tract) infections: Secondary | ICD-10-CM | POA: Diagnosis not present

## 2013-09-26 DIAGNOSIS — Y9389 Activity, other specified: Secondary | ICD-10-CM | POA: Insufficient documentation

## 2013-09-26 DIAGNOSIS — J45909 Unspecified asthma, uncomplicated: Secondary | ICD-10-CM | POA: Insufficient documentation

## 2013-09-26 DIAGNOSIS — S161XXA Strain of muscle, fascia and tendon at neck level, initial encounter: Secondary | ICD-10-CM

## 2013-09-26 DIAGNOSIS — Z8742 Personal history of other diseases of the female genital tract: Secondary | ICD-10-CM | POA: Insufficient documentation

## 2013-09-26 DIAGNOSIS — Z8619 Personal history of other infectious and parasitic diseases: Secondary | ICD-10-CM | POA: Insufficient documentation

## 2013-09-26 DIAGNOSIS — Z8719 Personal history of other diseases of the digestive system: Secondary | ICD-10-CM | POA: Insufficient documentation

## 2013-09-26 MED ORDER — ACETAMINOPHEN 500 MG PO TABS: 1000.0000 mg | ORAL_TABLET | Freq: Once | ORAL | Status: DC

## 2013-09-26 MED ORDER — ACETAMINOPHEN 500 MG PO TABS: 500.0000 mg | ORAL_TABLET | Freq: Four times a day (QID) | ORAL | Status: DC | PRN

## 2013-09-26 NOTE — ED Notes (Signed)
Pt called out stating that she needs to leave so she can pick up her kids. Pt states that her brother is on his way to the hospital to pick her up to take her home. Patria Maneampos, MD is aware, and states that he will go in and see the pt.

## 2013-09-26 NOTE — ED Notes (Signed)
Ambulated patient.   Patient tolerated well.   
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
## 2013-11-22 ENCOUNTER — Encounter (HOSPITAL_COMMUNITY): Payer: Self-pay | Admitting: Emergency Medicine

## 2013-11-22 ENCOUNTER — Emergency Department (INDEPENDENT_AMBULATORY_CARE_PROVIDER_SITE_OTHER)
Admission: EM | Admit: 2013-11-22 | Discharge: 2013-11-22 | Disposition: A | Discharge status: Home / Self Care | Payer: Medicaid Other | Source: Home / Self Care | Attending: Family Medicine | Admitting: Family Medicine

## 2013-11-22 DIAGNOSIS — G43809 Other migraine, not intractable, without status migrainosus: Secondary | ICD-10-CM

## 2013-11-22 HISTORY — DX: Gastrojejunal ulcer, unspecified as acute or chronic, without hemorrhage or perforation: K28.9

## 2013-11-22 HISTORY — DX: Migraine, unspecified, not intractable, without status migrainosus: G43.909

## 2013-11-22 LAB — POCT I-STAT, CHEM 8
BUN: 11 mg/dL (ref 6–23)
CHLORIDE: 103 meq/L (ref 96–112)
Calcium, Ion: 1.21 mmol/L (ref 1.12–1.23)
Creatinine, Ser: 1.2 mg/dL — ABNORMAL HIGH (ref 0.50–1.10)
Glucose, Bld: 86 mg/dL (ref 70–99)
HEMATOCRIT: 38 % (ref 36.0–46.0)
Hemoglobin: 12.9 g/dL (ref 12.0–15.0)
POTASSIUM: 3.8 meq/L (ref 3.7–5.3)
Sodium: 142 mEq/L (ref 137–147)
TCO2: 26 mmol/L (ref 0–100)

## 2013-11-22 MED ORDER — KETOROLAC TROMETHAMINE 30 MG/ML IJ SOLN
INTRAMUSCULAR | Status: AC
  Filled 2013-11-22: qty 1

## 2013-11-22 MED ORDER — DIPHENHYDRAMINE HCL 50 MG/ML IJ SOLN
50.0000 mg | Freq: Once | INTRAMUSCULAR | Status: AC
  Administered 2013-11-22: 50 mg via INTRAMUSCULAR

## 2013-11-22 MED ORDER — KETOROLAC TROMETHAMINE 30 MG/ML IJ SOLN
30.0000 mg | Freq: Once | INTRAMUSCULAR | Status: AC
  Administered 2013-11-22: 30 mg via INTRAMUSCULAR

## 2013-11-22 MED ORDER — DIPHENHYDRAMINE HCL 50 MG/ML IJ SOLN
INTRAMUSCULAR | Status: AC
  Filled 2013-11-22: qty 1

## 2013-11-22 MED ORDER — TRIAMCINOLONE ACETONIDE 40 MG/ML IJ SUSP
40.0000 mg | Freq: Once | INTRAMUSCULAR | Status: AC
  Administered 2013-11-22: 40 mg via INTRAMUSCULAR

## 2013-11-22 MED ORDER — TRIAMCINOLONE ACETONIDE 40 MG/ML IJ SUSP
INTRAMUSCULAR | Status: AC
  Filled 2013-11-22: qty 1

## 2013-11-22 MED ORDER — AMITRIPTYLINE HCL 50 MG PO TABS: 50.0000 mg | ORAL_TABLET | Freq: Every day | ORAL | Status: DC

## 2013-11-22 NOTE — Discharge Instructions (Signed)
Use medicine nightly at bedtime, see your doctor if further problems,

## 2013-11-22 NOTE — ED Provider Notes (Addendum)
CSN: [id number]     Arrival date & time 11/22/13  1840 History   First MD Initiated Contact with Patient 11/22/13 1940     Chief Complaint  Patient presents with  . Headache   (Consider location/radiation/quality/duration/timing/severity/associated sxs/prior Treatment) Patient is a 28 y.o. female presenting with headaches. The history is provided by the patient.  Headache Pain location:  Generalized Quality:  Dull Radiates to:  Does not radiate Onset quality:  Gradual Duration:  2 weeks Progression:  Unchanged Chronicity:  New Similar to prior headaches: no   Ineffective treatments:  Acetaminophen and NSAIDs Associated symptoms: blurred vision and visual change   Associated symptoms: no back pain, no congestion, no dizziness, no drainage, no ear pain, no pain, no fever, no neck pain, no neck stiffness and no photophobia     Past Medical History  Diagnosis Date  . Asthma   . Migraine   . Reflux   . Anxiety   . Anxiety   . Environmental allergies   . Acid reflux   . Urinary tract infection   . Depression     no meds, currenly ok  . Ovarian cyst   . Chlamydia   . Miscarriage   . Ulcer of the stomach and intestine   . Migraines    Past Surgical History  Procedure Laterality Date  . Wisdom tooth extraction    . Colonoscopy    . Multiple tooth extractions     Family History  Problem Relation Age of Onset  . Hypotension Neg Hx   . Anesthesia problems Neg Hx   . Malignant hyperthermia Neg Hx   . Pseudochol deficiency Neg Hx   . Asthma Mother   . Heart disease Mother   . Diabetes Mother   . Hypertension Mother   . Asthma Maternal Aunt   . Hypertension Maternal Aunt   . Diabetes Maternal Aunt   . Asthma Maternal Uncle   . Hypertension Maternal Uncle   . Diabetes Maternal Uncle   . Heart disease Maternal Grandmother   . Diabetes Maternal Grandmother   . Hypertension Maternal Grandmother   . Heart disease Maternal Grandfather   . Diabetes Maternal Grandfather    . Hypertension Maternal Grandfather    History  Substance Use Topics  . Smoking status: Never Smoker   . Smokeless tobacco: Never Used  . Alcohol Use: No   OB History   Grav Para Term Preterm Abortions TAB SAB Ect Mult Living   5 3 3  0 2 0 2 0 0 3     Review of Systems  Constitutional: Negative.  Negative for fever.  HENT: Negative for congestion, ear pain, postnasal drip and rhinorrhea.   Eyes: Positive for blurred vision and visual disturbance. Negative for photophobia, pain and redness.  Respiratory: Negative.   Gastrointestinal: Negative.   Genitourinary: Negative.   Musculoskeletal: Negative for back pain, neck pain and neck stiffness.  Neurological: Positive for headaches. Negative for dizziness.    Allergies  Other; Latex; and Zofran  Home Medications   Prior to Admission medications   Medication Sig Start Date End Date Taking? Authorizing Provider  acetaminophen (TYLENOL) 500 MG tablet Take 1,000 mg by mouth every 6 (six) hours as needed for headache. 09/26/13  Yes Lyanne CoKevin M Campos, MD  ibuprofen (ADVIL,MOTRIN) 800 MG tablet Take 800 mg by mouth every 8 (eight) hours as needed for headache.   Yes Historical Provider, MD  amitriptyline (ELAVIL) 50 MG tablet Take 1 tablet (50 mg total) by mouth  at bedtime. 11/22/13   Linna Hoff, MD   BP 116/77  Pulse 65  Temp(Src) 98.4 F (36.9 C) (Oral)  Resp 16  SpO2 100%  LMP 11/18/2013  Breastfeeding? No Physical Exam  Nursing note and vitals reviewed. Constitutional: She is oriented to person, place, and time. She appears well-developed and well-nourished. No distress.  HENT:  Head: Normocephalic.  Right Ear: External ear normal.  Left Ear: External ear normal.  Mouth/Throat: Oropharynx is clear and moist.  Eyes: Conjunctivae and EOM are normal. Pupils are equal, round, and reactive to light.  Neck: Normal range of motion. Neck supple.  Lymphadenopathy:    She has no cervical adenopathy.  Neurological: She is alert  and oriented to person, place, and time. No cranial nerve deficit. Coordination normal.  Skin: Skin is warm and dry.    ED Course  Procedures (including critical care time) Labs Review Labs Reviewed  POCT I-STAT, CHEM 8 - Abnormal; Notable for the following:    Creatinine, Ser 1.20 (*)    All other components within normal limits   i-stat wnl. Imaging Review No results found.   MDM   1. Migraine variant with headache        Linna Hoff, MD 11/22/13 2031  Linna Hoff, MD 11/22/13 2032

## 2013-11-22 NOTE — ED Notes (Signed)
C/o headache and blurred vision for 2 weeks. Has tried Ibuprofen and Tylenol without relief.  Has a little bit of photophobia.  Hx. Migraines.  Does not recall ever having a CT scan of her head.  No N or V.

## 2013-11-26 ENCOUNTER — Encounter (HOSPITAL_COMMUNITY): Payer: Self-pay | Admitting: Emergency Medicine

## 2013-11-26 DIAGNOSIS — Z9109 Other allergy status, other than to drugs and biological substances: Secondary | ICD-10-CM | POA: Insufficient documentation

## 2013-11-26 DIAGNOSIS — R5381 Other malaise: Secondary | ICD-10-CM | POA: Insufficient documentation

## 2013-11-26 DIAGNOSIS — H53149 Visual discomfort, unspecified: Secondary | ICD-10-CM | POA: Insufficient documentation

## 2013-11-26 DIAGNOSIS — G43109 Migraine with aura, not intractable, without status migrainosus: Secondary | ICD-10-CM | POA: Insufficient documentation

## 2013-11-26 DIAGNOSIS — K599 Functional intestinal disorder, unspecified: Secondary | ICD-10-CM | POA: Insufficient documentation

## 2013-11-26 DIAGNOSIS — R5383 Other fatigue: Secondary | ICD-10-CM

## 2013-11-26 DIAGNOSIS — F411 Generalized anxiety disorder: Secondary | ICD-10-CM | POA: Insufficient documentation

## 2013-11-26 DIAGNOSIS — O039 Complete or unspecified spontaneous abortion without complication: Secondary | ICD-10-CM | POA: Insufficient documentation

## 2013-11-26 DIAGNOSIS — Z87440 Personal history of urinary (tract) infections: Secondary | ICD-10-CM | POA: Insufficient documentation

## 2013-11-26 DIAGNOSIS — Z91040 Latex allergy status: Secondary | ICD-10-CM | POA: Insufficient documentation

## 2013-11-26 DIAGNOSIS — H538 Other visual disturbances: Secondary | ICD-10-CM | POA: Insufficient documentation

## 2013-11-26 DIAGNOSIS — Z79899 Other long term (current) drug therapy: Secondary | ICD-10-CM | POA: Insufficient documentation

## 2013-11-26 DIAGNOSIS — R51 Headache: Secondary | ICD-10-CM | POA: Insufficient documentation

## 2013-11-26 DIAGNOSIS — F3289 Other specified depressive episodes: Secondary | ICD-10-CM | POA: Insufficient documentation

## 2013-11-26 DIAGNOSIS — F329 Major depressive disorder, single episode, unspecified: Secondary | ICD-10-CM | POA: Insufficient documentation

## 2013-11-26 DIAGNOSIS — Z888 Allergy status to other drugs, medicaments and biological substances status: Secondary | ICD-10-CM | POA: Insufficient documentation

## 2013-11-26 DIAGNOSIS — N83209 Unspecified ovarian cyst, unspecified side: Secondary | ICD-10-CM | POA: Insufficient documentation

## 2013-11-26 DIAGNOSIS — J45909 Unspecified asthma, uncomplicated: Secondary | ICD-10-CM | POA: Insufficient documentation

## 2013-11-26 DIAGNOSIS — B9789 Other viral agents as the cause of diseases classified elsewhere: Secondary | ICD-10-CM | POA: Insufficient documentation

## 2013-11-26 DIAGNOSIS — K219 Gastro-esophageal reflux disease without esophagitis: Secondary | ICD-10-CM | POA: Insufficient documentation

## 2013-11-26 NOTE — ED Notes (Signed)
Pt states she has been having a headache for three weeks that is causing her some dizziness at times.

## 2013-11-27 ENCOUNTER — Emergency Department (HOSPITAL_COMMUNITY)
Admission: EM | Admit: 2013-11-27 | Discharge: 2013-11-27 | Disposition: A | Discharge status: Home / Self Care | Payer: Self-pay | Attending: Emergency Medicine | Admitting: Emergency Medicine

## 2013-11-27 ENCOUNTER — Emergency Department (HOSPITAL_COMMUNITY): Payer: Self-pay

## 2013-11-27 DIAGNOSIS — R51 Headache: Secondary | ICD-10-CM

## 2013-11-27 DIAGNOSIS — R519 Headache, unspecified: Secondary | ICD-10-CM

## 2013-11-27 MED ORDER — KETOROLAC TROMETHAMINE 10 MG PO TABS: 10.0000 mg | ORAL_TABLET | Freq: Four times a day (QID) | ORAL | Status: DC | PRN

## 2013-11-27 MED ORDER — KETOROLAC TROMETHAMINE 60 MG/2ML IM SOLN
60.0000 mg | Freq: Once | INTRAMUSCULAR | Status: AC
  Administered 2013-11-27: 60 mg via INTRAMUSCULAR
  Filled 2013-11-27: qty 2

## 2013-11-27 NOTE — ED Notes (Addendum)
Patient with headache for three weeks.  Patient states that she has a throbbing/burning feeling.  Patient is CAOx3, denies any hypertension.  No nausea or vomiting.  Patient does have photophobia and noise sensitivity.

## 2013-11-27 NOTE — ED Notes (Signed)
Returned from CT.

## 2013-11-27 NOTE — ED Provider Notes (Signed)
CSN: [id number]     Arrival date & time 11/26/13  2117 History   First MD Initiated Contact with Patient 11/27/13 0125     Chief Complaint  Patient presents with  . Headache     (Consider location/radiation/quality/duration/timing/severity/associated sxs/prior Treatment) HPI Comments: A 28 year old female, history of headache for the last 3 weeks, she does report a history of migraines to the nursing staff but to me she states that she does not usually get headaches. The headache started 3 weeks ago, they're located on the top of her head and feels like a throbbing and burning sensation. They have been there most days, all of the day long but temporarily go away, occasionally with medications but not always. She denies any weakness or numbness though today she has felt very fatigued, she does report a blurred vision with seeing spots last 24 hours. She was seen at the urgent care approximately 5 days ago, started on anti-inflammatories with no significant improvement. She denies fevers chills nausea vomiting but does have photophobia. She denies any weakness, numbness, difficulty speaking or walking.   Patient is a 28 y.o. female presenting with headaches. The history is provided by the patient.  Headache   Past Medical History  Diagnosis Date  . Asthma   . Migraine   . Reflux   . Anxiety   . Anxiety   . Environmental allergies   . Acid reflux   . Urinary tract infection   . Depression     no meds, currenly ok  . Ovarian cyst   . Chlamydia   . Miscarriage   . Ulcer of the stomach and intestine   . Migraines    Past Surgical History  Procedure Laterality Date  . Wisdom tooth extraction    . Colonoscopy    . Multiple tooth extractions     Family History  Problem Relation Age of Onset  . Hypotension Neg Hx   . Anesthesia problems Neg Hx   . Malignant hyperthermia Neg Hx   . Pseudochol deficiency Neg Hx   . Asthma Mother   . Heart disease Mother   . Diabetes Mother   .  Hypertension Mother   . Asthma Maternal Aunt   . Hypertension Maternal Aunt   . Diabetes Maternal Aunt   . Asthma Maternal Uncle   . Hypertension Maternal Uncle   . Diabetes Maternal Uncle   . Heart disease Maternal Grandmother   . Diabetes Maternal Grandmother   . Hypertension Maternal Grandmother   . Heart disease Maternal Grandfather   . Diabetes Maternal Grandfather   . Hypertension Maternal Grandfather    History  Substance Use Topics  . Smoking status: Never Smoker   . Smokeless tobacco: Never Used  . Alcohol Use: No   OB History   Grav Para Term Preterm Abortions TAB SAB Ect Mult Living   5 3 3  0 2 0 2 0 0 3     Review of Systems  Neurological: Positive for headaches.  All other systems reviewed and are negative.     Allergies  Other; Latex; and Zofran  Home Medications   Prior to Admission medications   Medication Sig Start Date End Date Taking? Authorizing Provider  acetaminophen (TYLENOL) 500 MG tablet Take 1,000 mg by mouth every 6 (six) hours as needed for headache. 09/26/13  Yes Lyanne CoKevin M Campos, MD  albuterol (PROVENTIL HFA;VENTOLIN HFA) 108 (90 BASE) MCG/ACT inhaler Inhale 2 puffs into the lungs every 6 (six) hours as needed for  wheezing or shortness of breath.   Yes Historical Provider, MD  amitriptyline (ELAVIL) 50 MG tablet Take 1 tablet (50 mg total) by mouth at bedtime. 11/22/13  Yes Linna Hoff, MD  ketorolac (TORADOL) 10 MG tablet Take 1 tablet (10 mg total) by mouth every 6 (six) hours as needed. 11/27/13   Vida Roller, MD   BP 127/73  Pulse 67  Temp(Src) 98 F (36.7 C) (Oral)  Resp 17  SpO2 100%  LMP 11/18/2013 Physical Exam  Nursing note and vitals reviewed. Constitutional: She appears well-developed and well-nourished. No distress.  HENT:  Head: Normocephalic and atraumatic.  Mouth/Throat: Oropharynx is clear and moist. No oropharyngeal exudate.  Eyes: Conjunctivae and EOM are normal. Pupils are equal, round, and reactive to light.  Right eye exhibits no discharge. Left eye exhibits no discharge. No scleral icterus.  Neck: Normal range of motion. Neck supple. No JVD present. No thyromegaly present.  Cardiovascular: Normal rate, regular rhythm, normal heart sounds and intact distal pulses.  Exam reveals no gallop and no friction rub.   No murmur heard. Pulmonary/Chest: Effort normal and breath sounds normal. No respiratory distress. She has no wheezes. She has no rales.  Abdominal: Soft. Bowel sounds are normal. She exhibits no distension and no mass. There is no tenderness.  Musculoskeletal: Normal range of motion. She exhibits no edema and no tenderness.  Lymphadenopathy:    She has no cervical adenopathy.  Neurological: She is alert. Coordination normal.  Speech is clear, cranial nerves III through XII are intact, memory is intact, strength is normal in all 4 extremities including grips, sensation is intact to light touch and pinprick in all 4 extremities. Coordination as tested by finger-nose-finger is normal, no limb ataxia.   Skin: Skin is warm and dry. No rash noted. No erythema.  Psychiatric: She has a normal mood and affect. Her behavior is normal.    ED Course  Procedures (including critical care time) Labs Review Labs Reviewed - No data to display  Imaging Review Ct Head Wo Contrast  11/27/2013   CLINICAL DATA:  Headache with blurred vision  EXAM: CT HEAD WITHOUT CONTRAST  TECHNIQUE: Contiguous axial images were obtained from the base of the skull through the vertex without intravenous contrast.  COMPARISON:  None.  FINDINGS: The ventricles are normal in size and configuration. There is no mass, hemorrhage, extra-axial fluid collection, or midline shift. The gray-white compartments appear normal. No acute infarct apparent. Bony calvarium appears intact. The mastoid air cells are clear. There is a retention cyst in the medial left maxillary antrum. There is debris in the external auditory canal on the right.   IMPRESSION: No intracranial mass, hemorrhage, or acute appearing infarct. Retention cyst in left maxillary antrum. Presumed cerumen in the right external auditory canal.   Electronically Signed   By: Bretta Bang M.D.   On: 11/27/2013 02:41      MDM   Final diagnoses:  Nonintractable headache, unspecified chronicity pattern, unspecified headache type    The patient has new onset of headaches as well as some visual changes, CT scan will be performed to rule out intracranial lesion, vital signs are totally normal, no signs of sinusitis, no signs of central infection, no focal neurologic deficits on my exam. Toradol ordered. The onset was not thunderclap, it was not sudden, she has no meningismus.  After Toradol the patient states that headache is resolved, number visual changes, CT scan reviewed and is negative for intracranial abnormalities, stable for discharge  Meds given in ED:  Medications  ketorolac (TORADOL) injection 60 mg (60 mg Intramuscular Given 11/27/13 0142)    New Prescriptions   KETOROLAC (TORADOL) 10 MG TABLET    Take 1 tablet (10 mg total) by mouth every 6 (six) hours as needed.      Vida Roller, MD 11/27/13 [phone]

## 2013-11-27 NOTE — Discharge Instructions (Signed)
Headache: ° °You are having a headache. No specific cause was found today for your headache. It may have been a migraine or other cause of headache. Stress, anxiety, fatigue, and depression are common triggers for headaches. Your headache today does not appear to be life-threatening or require hospitalization, but often the exact cause of headaches is not determined in the emergency department. Therefore, followup with your doctor is very important to find out what may have caused your headache, and whether or not you need any further diagnostic testing or treatment. Sometimes headaches can appear benign but then more serious symptoms can develop which should prompt an immediate reevaluation by your doctor or the emergency department. ° °Seek immediate medical attention if: ° °· You develop possible problems with medications prescribed. °· The medications don't resolve your headache, if it recurs, or if you have multiple episodes of vomiting or can't take fluids by mouth °· You have a change from the usual headache. °· If you developed a sudden severe headache or confusion, become poorly responsive or faint, developed a fever above 100.4 or problems breathing, have a change in speech, vision, swallowing or understanding, or developed new weakness, numbness, tingling, incoordination or have a seizure. ° °If you don't have a family doctor to follow up with, see the follow up list below - call this morning for a follow-up appointment in the next 1-2 days. ° °

## 2013-11-28 ENCOUNTER — Encounter (HOSPITAL_COMMUNITY): Payer: Self-pay | Admitting: Emergency Medicine

## 2013-11-28 ENCOUNTER — Emergency Department (HOSPITAL_COMMUNITY)
Admission: EM | Admit: 2013-11-28 | Discharge: 2013-11-29 | Disposition: A | Discharge status: Home / Self Care | Payer: Self-pay | Attending: Emergency Medicine | Admitting: Emergency Medicine

## 2013-11-28 DIAGNOSIS — Z79899 Other long term (current) drug therapy: Secondary | ICD-10-CM | POA: Insufficient documentation

## 2013-11-28 DIAGNOSIS — J029 Acute pharyngitis, unspecified: Secondary | ICD-10-CM | POA: Insufficient documentation

## 2013-11-28 DIAGNOSIS — J45909 Unspecified asthma, uncomplicated: Secondary | ICD-10-CM | POA: Insufficient documentation

## 2013-11-28 DIAGNOSIS — R51 Headache: Secondary | ICD-10-CM | POA: Insufficient documentation

## 2013-11-28 DIAGNOSIS — Z87440 Personal history of urinary (tract) infections: Secondary | ICD-10-CM | POA: Insufficient documentation

## 2013-11-28 DIAGNOSIS — Z8742 Personal history of other diseases of the female genital tract: Secondary | ICD-10-CM | POA: Insufficient documentation

## 2013-11-28 DIAGNOSIS — Z8679 Personal history of other diseases of the circulatory system: Secondary | ICD-10-CM | POA: Insufficient documentation

## 2013-11-28 DIAGNOSIS — Z8719 Personal history of other diseases of the digestive system: Secondary | ICD-10-CM | POA: Insufficient documentation

## 2013-11-28 DIAGNOSIS — Z8619 Personal history of other infectious and parasitic diseases: Secondary | ICD-10-CM | POA: Insufficient documentation

## 2013-11-28 DIAGNOSIS — Z91040 Latex allergy status: Secondary | ICD-10-CM | POA: Insufficient documentation

## 2013-11-28 DIAGNOSIS — R519 Headache, unspecified: Secondary | ICD-10-CM

## 2013-11-28 DIAGNOSIS — Z8659 Personal history of other mental and behavioral disorders: Secondary | ICD-10-CM | POA: Insufficient documentation

## 2013-11-28 MED ORDER — SUMATRIPTAN SUCCINATE 6 MG/0.5ML ~~LOC~~ SOLN
6.0000 mg | Freq: Once | SUBCUTANEOUS | Status: AC
  Administered 2013-11-29: 6 mg via SUBCUTANEOUS
  Filled 2013-11-28: qty 0.5

## 2013-11-28 NOTE — ED Provider Notes (Signed)
CSN: [id number]     Arrival date & time 11/28/13  1952 History   First MD Initiated Contact with Patient 11/28/13 2301     Chief Complaint  Patient presents with  . Headache  . Sore Throat     (Consider location/radiation/quality/duration/timing/severity/associated sxs/prior Treatment) HPI Comments: Pt presents with headache - has had this for 3 weeks intermittently, better with naprosyn temporarily and worse with bright lights - she has associated sore throat today.  Sx are moderate, no vomiting or fevers or rashes - she reports small am't of swellign of the ankles bilaterally.  When I saw this lady in the last couple of days at [facility], I obtained a CT scan which was neg for IC findings.    Patient is a 28 y.o. female presenting with headaches and pharyngitis. The history is provided by the patient and medical records.  Headache Sore Throat Associated symptoms include headaches.    Past Medical History  Diagnosis Date  . Asthma   . Migraine   . Reflux   . Anxiety   . Anxiety   . Environmental allergies   . Acid reflux   . Urinary tract infection   . Depression     no meds, currenly ok  . Ovarian cyst   . Chlamydia   . Miscarriage   . Ulcer of the stomach and intestine   . Migraines    Past Surgical History  Procedure Laterality Date  . Wisdom tooth extraction    . Colonoscopy    . Multiple tooth extractions     Family History  Problem Relation Age of Onset  . Hypotension Neg Hx   . Anesthesia problems Neg Hx   . Malignant hyperthermia Neg Hx   . Pseudochol deficiency Neg Hx   . Asthma Mother   . Heart disease Mother   . Diabetes Mother   . Hypertension Mother   . Asthma Maternal Aunt   . Hypertension Maternal Aunt   . Diabetes Maternal Aunt   . Asthma Maternal Uncle   . Hypertension Maternal Uncle   . Diabetes Maternal Uncle   . Heart disease Maternal Grandmother   . Diabetes Maternal Grandmother   . Hypertension Maternal Grandmother   .  Heart disease Maternal Grandfather   . Diabetes Maternal Grandfather   . Hypertension Maternal Grandfather    History  Substance Use Topics  . Smoking status: Never Smoker   . Smokeless tobacco: Never Used  . Alcohol Use: No   OB History   Grav Para Term Preterm Abortions TAB SAB Ect Mult Living   5 3 3  0 2 0 2 0 0 3     Review of Systems  Neurological: Positive for headaches.  All other systems reviewed and are negative.     Allergies  Other; Latex; and Zofran  Home Medications   Prior to Admission medications   Medication Sig Start Date End Date Taking? Authorizing Provider  albuterol (PROVENTIL HFA;VENTOLIN HFA) 108 (90 BASE) MCG/ACT inhaler Inhale 2 puffs into the lungs every 6 (six) hours as needed for wheezing or shortness of breath.   Yes Historical Provider, MD   BP 117/76  Pulse 97  Temp(Src) 98.5 F (36.9 C) (Oral)  Resp 16  SpO2 100%  LMP 11/18/2013 Physical Exam  Nursing note and vitals reviewed. Constitutional: She appears well-developed and well-nourished. No distress.  HENT:  Head: Normocephalic and atraumatic.  Mouth/Throat: Oropharynx is clear and moist. No oropharyngeal exudate.  Pharynx erythematous, no exudate,  hypertrophy, or assymetry  Eyes: Conjunctivae and EOM are normal. Pupils are equal, round, and reactive to light. Right eye exhibits no discharge. Left eye exhibits no discharge. No scleral icterus.  Neck: Normal range of motion. Neck supple. No JVD present. No thyromegaly present.  No LAD  Cardiovascular: Normal rate, regular rhythm, normal heart sounds and intact distal pulses.  Exam reveals no gallop and no friction rub.   No murmur heard. Pulmonary/Chest: Effort normal and breath sounds normal. No respiratory distress. She has no wheezes. She has no rales.  Abdominal: Soft. Bowel sounds are normal. She exhibits no distension and no mass. There is no tenderness.  Musculoskeletal: Normal range of motion. She exhibits no edema and no  tenderness.  Lymphadenopathy:    She has no cervical adenopathy.  Neurological: She is alert. Coordination normal.  Normal speech, normal strength and coordination and CN 3-12 intact.  Hoarse voice  Skin: Skin is warm and dry. No rash noted. No erythema.  Psychiatric: She has a normal mood and affect. Her behavior is normal.    ED Course  Procedures (including critical care time) Labs Review Labs Reviewed - No data to display  Imaging Review Ct Head Wo Contrast  11/27/2013   CLINICAL DATA:  Headache with blurred vision  EXAM: CT HEAD WITHOUT CONTRAST  TECHNIQUE: Contiguous axial images were obtained from the base of the skull through the vertex without intravenous contrast.  COMPARISON:  None.  FINDINGS: The ventricles are normal in size and configuration. There is no mass, hemorrhage, extra-axial fluid collection, or midline shift. The gray-white compartments appear normal. No acute infarct apparent. Bony calvarium appears intact. The mastoid air cells are clear. There is a retention cyst in the medial left maxillary antrum. There is debris in the external auditory canal on the right.  IMPRESSION: No intracranial mass, hemorrhage, or acute appearing infarct. Retention cyst in left maxillary antrum. Presumed cerumen in the right external auditory canal.   Electronically Signed   By: Bretta BangWilliam  Woodruff M.D.   On: 11/27/2013 02:41      MDM   Final diagnoses:  None    CT reviewed again, will try other meds, r/o strep - no acute findings on exam to suggest that headache is patholgoical  Strep neg, stable for d/c.  Vida RollerBrian D Smt Lokey, MD 11/29/13 [phone]

## 2013-11-28 NOTE — ED Notes (Signed)
Pt states that she has had a headache x 3 weeks. Also notes that she has had sore throat today. Has been seen for headache in past but it continues. Alert and oriented.

## 2013-11-29 LAB — RAPID STREP SCREEN (MED CTR MEBANE ONLY): Streptococcus, Group A Screen (Direct): NEGATIVE

## 2013-11-29 MED ORDER — SUMATRIPTAN SUCCINATE 25 MG PO TABS: 50.0000 mg | ORAL_TABLET | ORAL | Status: DC | PRN

## 2013-11-29 NOTE — ED Notes (Signed)
Pt ambulated with steady gait from ED with 3 children. Pt denies HA at this time only sore throat.

## 2013-11-29 NOTE — Discharge Instructions (Signed)
You do NOT have strep - use the new medicine imitrex for headaches.

## 2013-11-30 LAB — CULTURE, GROUP A STREP

## 2014-01-03 ENCOUNTER — Emergency Department (INDEPENDENT_AMBULATORY_CARE_PROVIDER_SITE_OTHER)
Admission: EM | Admit: 2014-01-03 | Discharge: 2014-01-03 | Disposition: A | Discharge status: Home / Self Care | Payer: Self-pay | Source: Home / Self Care

## 2014-01-03 ENCOUNTER — Encounter (HOSPITAL_COMMUNITY): Payer: Self-pay | Admitting: Emergency Medicine

## 2014-01-03 DIAGNOSIS — M7651 Patellar tendinitis, right knee: Secondary | ICD-10-CM

## 2014-01-03 DIAGNOSIS — M7650 Patellar tendinitis, unspecified knee: Secondary | ICD-10-CM

## 2014-01-03 MED ORDER — MELOXICAM 15 MG PO TABS: 15.0000 mg | ORAL_TABLET | Freq: Every day | ORAL | Status: DC

## 2014-01-03 MED ORDER — TRAMADOL HCL 50 MG PO TABS: 50.0000 mg | ORAL_TABLET | Freq: Four times a day (QID) | ORAL | Status: DC | PRN

## 2014-01-03 MED ORDER — DICLOFENAC SODIUM 1 % TD GEL: 1.0000 "application " | Freq: Four times a day (QID) | TRANSDERMAL | Status: DC

## 2014-01-03 NOTE — ED Provider Notes (Signed)
CSN: [id number]     Arrival date & time 01/03/14  1802 History   First MD Initiated Contact with Patient 01/03/14 1827     Chief Complaint  Patient presents with  . Leg Swelling   (Consider location/radiation/quality/duration/timing/severity/associated sxs/prior Treatment) HPI Comments: 28 year old obese female complaining of pain to the right knee and right lower leg. She states that there is burning to the anterior aspect of the knee and radiates to the foot. There is also some tingling in the foot. She had some swelling of the right lower extremity a year ago and was treated with diuretics for a short period of time. She works in a [employer] and is standing all day long. There is minor swelling of the left leg, primarily subjective but she is most concerned with the right knee pain and shin pain. Denies trauma, fall or other type of injury.   Past Medical History  Diagnosis Date  . Asthma   . Migraine   . Reflux   . Anxiety   . Anxiety   . Environmental allergies   . Acid reflux   . Urinary tract infection   . Depression     no meds, currenly ok  . Ovarian cyst   . Chlamydia   . Miscarriage   . Ulcer of the stomach and intestine   . Migraines    Past Surgical History  Procedure Laterality Date  . Wisdom tooth extraction    . Colonoscopy    . Multiple tooth extractions     Family History  Problem Relation Age of Onset  . Hypotension Neg Hx   . Anesthesia problems Neg Hx   . Malignant hyperthermia Neg Hx   . Pseudochol deficiency Neg Hx   . Asthma Mother   . Heart disease Mother   . Diabetes Mother   . Hypertension Mother   . Asthma Maternal Aunt   . Hypertension Maternal Aunt   . Diabetes Maternal Aunt   . Asthma Maternal Uncle   . Hypertension Maternal Uncle   . Diabetes Maternal Uncle   . Heart disease Maternal Grandmother   . Diabetes Maternal Grandmother   . Hypertension Maternal Grandmother   . Heart disease Maternal Grandfather   . Diabetes Maternal  Grandfather   . Hypertension Maternal Grandfather    History  Substance Use Topics  . Smoking status: Never Smoker   . Smokeless tobacco: Never Used  . Alcohol Use: No   OB History   Grav Para Term Preterm Abortions TAB SAB Ect Mult Living   5 3 3  0 2 0 2 0 0 3     Review of Systems  Constitutional: Positive for activity change. Negative for fever, chills, appetite change and fatigue.  HENT: Negative.   Respiratory: Negative.   Cardiovascular: Negative.   Musculoskeletal: Positive for gait problem. Negative for back pain, myalgias and neck pain.       As per HPI  Skin: Negative for color change, pallor and rash.  Neurological: Negative.     Allergies  Other; Latex; and Zofran  Home Medications   Prior to Admission medications   Medication Sig Start Date End Date Taking? Authorizing Provider  albuterol (PROVENTIL HFA;VENTOLIN HFA) 108 (90 BASE) MCG/ACT inhaler Inhale 2 puffs into the lungs every 6 (six) hours as needed for wheezing or shortness of breath.    Historical Provider, MD  diclofenac sodium (VOLTAREN) 1 % GEL Apply 1 application topically 4 (four) times daily. 01/03/14   Hayden Rasmussenavid Ramone Gander, NP  meloxicam (MOBIC) 15 MG tablet Take 1 tablet (15 mg total) by mouth daily. 01/03/14   Hayden Rasmussen, NP  SUMAtriptan (IMITREX) 25 MG tablet Take 2 tablets (50 mg total) by mouth every 2 (two) hours as needed for migraine (ongoing headache). Maximum daily dose 200mg  11/29/13   Vida Roller, MD  traMADol (ULTRAM) 50 MG tablet Take 1 tablet (50 mg total) by mouth every 6 (six) hours as needed. 01/03/14   Hayden Rasmussen, NP   BP 113/74  Pulse 73  Temp(Src) 98.8 F (37.1 C) (Oral)  Resp 18  SpO2 100% Physical Exam  Nursing note and vitals reviewed. Constitutional: She is oriented to person, place, and time. She appears well-developed and well-nourished. No distress.  HENT:  Head: Normocephalic and atraumatic.  Eyes: EOM are normal.  Neck: Normal range of motion. Neck supple.   Cardiovascular: Normal rate.   Pulmonary/Chest: Effort normal. No respiratory distress.  Musculoskeletal:  Marked tenderness over the anterior knee at the tibial tuberosity. Tenderness extends along the anterior tibia. Pain is exacerbated with extension and flexion at the knee. There is no tenderness to the joint spaces, posterior, lateral or upper anterior knee. No appreciable swelling to the knee. Minor swelling to the lower right leg. Distal neurovascular motor sensory is intact. Pedal pulses 2+. Ankle is intact and asymptomatic.  Neurological: She is alert and oriented to person, place, and time. No cranial nerve deficit.  Skin: Skin is warm and dry.    ED Course  Procedures (including critical care time) Labs Review Labs Reviewed - No data to display  Imaging Review No results found.   MDM   1. Patellar tendinitis of right knee    Knee immobilizer while walking. 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

## 2014-01-03 NOTE — ED Provider Notes (Signed)
Medical screening examination/treatment/procedure(s) were performed by non-physician practitioner and as supervising physician I was immediately available for consultation/collaboration.  Philemon Riedesel, M.D.  Marcelles Clinard C Journey [place], MD 01/03/14 2301 

## 2014-01-03 NOTE — ED Notes (Signed)
C/o onset 3 weeks ago of pain, burning, swelling in both knees extending to feet. Denies injury . States she cannot feel her feet due to numbness. No prior history of uncontrolled swelling, but states she was on some kind of fluid pill last year (does not know why she was on the Rx or who Rx it for her )

## 2014-01-03 NOTE — Discharge Instructions (Signed)
Patellar Tendinitis, Jumper's Knee °with Rehab °Tendinitis is inflammation of a tendon. Tendonitis of the tendon below the kneecap (patella) is known as patellar tendonitis. Patellar tendonitis is a common cause of pain below the kneecap (infrapatellar). Patellar tendonitis may involve a tear (strain) in the ligament. Strains are classified into three categories. Grade 1 strains cause pain, but the tendon is not lengthened. Grade 2 strains include a lengthened ligament, due to the ligament being stretched or partially ruptured. With grade 2 strains there is still function, although function may be decreased. Grade 3 strains involve a complete tear of the tendon or muscle, and function is usually impaired. Patellar tendon strains are usually grade 1 or 2.  °SYMPTOMS  °· Pain, tenderness, swelling, warmth, or redness over the patellar tendon (just below the kneecap). °· Pain and loss of strength (sometimes), with forcefully straightening the knee (especially when jumping or rising from a seated or squatting position), or bending the knee completely (squatting or kneeling). °· Crackling sound (crepitation) when the tendon is moved or touched. °CAUSES  °Patellar tendonitis is caused by injury to the patellar tendon. The inflammation is the body's healing response. Common causes of injury include: °· Stress from a sudden increase in intensity, frequency, or duration of training. °· Overuse of the thigh muscles (quadriceps) and patellar tendon. °· Direct hit (trauma) to the knee or patellar tendon. °RISK INCREASES WITH: °· Sports that require sudden, explosive quadriceps contraction, such as jumping, quick starts, or kicking. °· Running sports, especially running down hills. °· Poor strength and flexibility of the thigh and knee. °· Flat feet. °PREVENTION °· Warm up and stretch properly before activity. °· Allow for adequate recovery between workouts. °· Maintain physical fitness: °¨ Strength, flexibility, and  endurance. °¨ Cardiovascular fitness. °· Protect the knee joint with taping, protective strapping, bracing, or elastic compression bandage. °· Wear arch supports (orthotics). °PROGNOSIS  °If treated properly, patellar tendonitis usually heals within 6 weeks.  °RELATED COMPLICATIONS  °· Longer healing time if not properly treated or if not given enough time to heal. °· Recurring symptoms if activity is resumed too soon, with overuse, with a direct blow, or when using poor technique. °· If untreated, tendon rupture requiring surgery. °TREATMENT °Treatment first involves the use of ice and medicine to reduce pain and inflammation. The use of strengthening and stretching exercises may help reduce pain with activity. These exercises may be performed at home or with a therapist. Serious cases of tendonitis may require restraining the knee for 10 to 14 days to prevent stress on the tendon and to promote healing. Crutches may be used (uncommon) until you can walk without a limp. For cases in which nonsurgical treatment is unsuccessful, surgery may be advised to remove the inflamed tendon lining (sheath). Surgery is rare, and is only advised after at least 6 months of nonsurgical treatment. °MEDICATION  °· If pain medicine is needed, nonsteroidal anti-inflammatory medicines (aspirin and ibuprofen), or other minor pain relievers (acetaminophen), are often advised. °· Do not take pain medicine for 7 days before surgery. °· Prescription pain relievers may be given if your caregiver thinks they are needed. Use only as directed and only as much as you need. °HEAT AND COLD °· Cold treatment (icing) should be applied for 10 to 15 minutes every 2 to 3 hours for inflammation and pain, and immediately after activity that aggravates your symptoms. Use ice packs or an ice massage. °· Heat treatment may be used before performing stretching and strengthening activities   prescribed by your caregiver, physical therapist, or athletic trainer.  Use a heat pack or a warm water soak. °SEEK MEDICAL CARE IF: °· Symptoms get worse or do not improve in 2 weeks, despite treatment. °· New, unexplained symptoms develop. (Drugs used in treatment may produce side effects.) °EXERCISES °RANGE OF MOTION (ROM) AND STRETCHING EXERCISES - Patellar Tendinitis (Jumper's Knee) °These are some of the initial exercises with which you may start your rehabilitation program, until you see your caregiver again or until your symptoms are resolved. Remember:  °· Flexible tissue is more tolerant of the stresses placed on it during activities. °· Each stretch should be held for 20 to 30 seconds. °· A gentle stretching sensation should be felt. °STRETCH - Hamstrings, Supine °· Lie on your back. Loop a belt or towel over the ball of your right / left foot. °· Straighten your right / left knee and slowly pull on the belt to raise your leg. Do not allow the right / left knee to bend. Keep your opposite leg flat on the floor. °· Raise the leg until you feel a gentle stretch behind your right / left knee or thigh. Hold this position for __________ seconds. °Repeat __________ times. Complete this stretch __________ times per day.  °STRETCH - Hamstrings, Doorway °· Lie on your back with your right / left leg extended and resting on the wall, and the opposite leg flat on the ground through the door. At first, position your bottom farther away from the wall. °· Keep your right / left knee straight. If you feel a stretch behind your knee or thigh, hold this position for __________ seconds. °· If you do not feel a stretch, scoot your bottom closer to the door, and hold __________ seconds. °Repeat __________ times. Complete this stretch __________ times per day.  °STRETCH - Hamstrings, Standing °· Stand or sit and extend your right / left leg, placing your foot on a chair or foot stool. °· Keep a slight arch in your low back and your hips straight forward. °· Lead with your chest and lean forward  at the waist until you feel a gentle stretch in the back of your right / left knee or thigh. (When done correctly, this exercise requires leaning only a small distance.) °· Hold this position for __________ seconds. °Repeat __________ times. Complete this stretch __________ times per day. °STRETCH - Adductors, Lunge °· While standing, spread your legs, with your right / left leg behind you. °· Lean away from your right / left leg by bending your opposite knee. You may rest your hands on your thigh for balance. °· You should feel a stretch in your right / left inner thigh. Hold for __________ seconds. °Repeat __________ times. Complete this exercise __________ times per day.  °STRENGTHENING EXERCISES - Patellar Tendinitis (Jumper's Knee) °These exercises may help you when beginning to rehabilitate your injury. They may resolve your symptoms with or without further involvement from your physician, physical therapist or athletic trainer. While completing these exercises, remember:  °· Muscles can gain both the endurance and the strength needed for everyday activities through controlled exercises. °· Complete these exercises as instructed by your physician, physical therapist or athletic trainer. Increase the resistance and repetitions only as guided by your caregiver. °STRENGTH - Quadriceps, Isometrics °· Lie on your back with your right / left leg extended and your opposite knee bent. °· Gradually tense the muscles in the front of your right / left thigh. You should see   either your kneecap slide up toward your hip or increased dimpling just above the knee. This motion will push the back of the knee down toward the floor, mat, or bed on which you are lying. °· Hold the muscle as tight as you can, without increasing your pain, for __________ seconds. °· Relax the muscles slowly and completely in between each repetition. °Repeat __________ times. Complete this exercise __________ times per day.  °STRENGTH - Quadriceps,  Short Arcs °· Lie on your back. Place a __________ inch towel roll under your right / left knee, so that the knee bends slightly. °· Raise only your lower leg by tightening the muscles in the front of your thigh. Do not allow your thigh to rise. °· Hold this position for __________ seconds. °Repeat __________ times. Complete this exercise __________ times per day.  °OPTIONAL ANKLE WEIGHTS: Begin with ____________________, but DO NOT exceed ____________________. Increase in 1 pound/ 0.5 kilogram increments. °STRENGTH - Quadriceps, Straight Leg Raises  °Quality counts! Watch for signs that the quadriceps muscle is working, to be sure you are strengthening the correct muscles and not "cheating" by substituting with healthier muscles. °· Lay on your back with your right / left leg extended and your opposite knee bent. °· Tense the muscles in the front of your right / left thigh. You should see either your kneecap slide up or increased dimpling just above the knee. Your thigh may even shake a bit. °· Tighten these muscles even more and raise your leg 4 to 6 inches off the floor. Hold for __________ seconds. °· Keeping these muscles tense, lower your leg. °· Relax the muscles slowly and completely between each repetition. °Repeat __________ times. Complete this exercise __________ times per day.  °STRENGTH - Quadriceps, Squats °· Stand in a door frame so that your feet and knees are in line with the frame. °· Use your hands for balance, not support, on the frame. °· Slowly lower your weight, bending at the hips and knees. Keep your lower legs upright so that they are parallel with the door frame. Squat only within the range that does not increase your knee pain. Never let your hips drop below your knees. °· Slowly return upright, pushing with your legs, not pulling with your hands. °Repeat __________ times. Complete this exercise __________ times per day.  °STRENGTH - Quadriceps, Step-Downs °· Stand on the edge of a step  stool or stair. Be prepared to use a countertop or wall for balance, if needed. °· Keeping your right / left knee directly over the middle of your foot, slowly touch your opposite heel to the floor or lower step. Do not go all the way to the floor if your knee pain increases; just go as far as you can without increased discomfort. Use your right / left leg muscles, not gravity to lower your body weight. °· Slowly push your body weight back up to the starting position. °Repeat __________ times. Complete this exercise __________ times per day.  °Document Released: 05/03/2005 Document Revised: 09/17/2013 Document Reviewed: 08/15/2008 °[employer]® Patient Information ©2015 [employer]. This information is not intended to replace advice given to you by your health care provider. Make sure you discuss any questions you have with your health care provider. ° °Repetitive Strain Injuries °Repetitive strain injuries (RSIs) result from overuse or misuse of soft tissues including muscles, tendons, or nerves. Tendons are the cord-like structures that attach muscles to bones. RSIs can affect almost any part of the body. However,   RSIs are most common in the arms (thumbs, wrists, elbows, shoulders) and legs (ankles, knees). Common medical conditions that are often caused by repetitive strain include carpal tunnel syndrome, tennis or golfer's elbow, bursitis, and tendonitis. If RSIs are treated early, and the repeated activity is reduced or removed, the severity and length of your problems can usually be reduced. RSIs are also called cumulative trauma disorders (CTD).  °CAUSES  °Many RSIs occur due to repeating the same activity at work over weeks or months without sufficient rest, such as prolonged typing. RSIs also commonly occur when a hobby or sport is done repeatedly without sufficient rest. RSIs can also occur due to repeated strain or stress on a body part in someone who has one or more risk factors for RSIs. °RISK  FACTORS °Workplace risk factors °· Frequent computer use, especially if your workstation is not adjusted for your body type. °· Infrequent rest breaks. °· Working in a high-pressure environment. °· Working at a fast pace. °· Repeating the same motion, such as frequent typing. °· Working in an awkward position or holding the same position for a long time. °· Forceful movements such as lifting, pulling, or pushing. °· Vibration caused by using power tools. °· Working in cold temperatures. °· Job stress. °Personal risk factors °· Poor posture. °· Being loose-jointed. °· Not exercising regularly. °· Being overweight. °· Arthritis, diabetes, thyroid problems, or other long-term (chronic) medical conditions. °· Vitamin deficiencies. °· Keeping your fingernails long. °· An unhealthy, stressful, or inactive lifestyle. °· Not sleeping well. °SYMPTOMS  °Symptoms often begin at work but become more noticeable after the repeated stress has ended. For example, you may develop fatigue or soreness in your  wrist while typing at work, and at night you may develop numbness and tingling in your fingers. Common symptoms include:  °· Burning, shooting, or aching pain, especially in the fingers, palms, wrists, forearms, or shoulders. °· Tenderness. °· Swelling. °· Tingling, numbness, or loss of feeling. °· Pain with certain activities, such as turning a doorknob or reaching above your head. °· Weakness, heaviness, or loss of coordination in your hand. °· Muscle spasms or tightness. °In some cases, symptoms can become so intense that it is difficult to perform everyday tasks. Symptoms that do not improve with rest may indicate a more serious condition.  °DIAGNOSIS  °Your caregiver may determine the type of RSI you have based on your medical evaluation and a description of your activities.  °TREATMENT  °Treatment depends on the severity and type of RSI you have. Your caregiver may recommend rest for the affected body part, medicines, and  physical or occupational therapy to reduce pain, swelling, and soreness. Discuss the activities you do repeatedly with your caregiver. Your caregiver can help you decide whether you need to change your activities. An RSI may take months or years to heal, especially if the affected body part gets insufficient rest. In some cases, such as severe carpal tunnel syndrome, surgery may be recommended. °PREVENTION °· Talk with your supervisor to make sure you have the proper equipment for your work station. °· Maintain good posture at your desk or work station with: °¨ Feet flat on the floor. °¨ Knees directly over the feet, bent at a right angle. °¨ Lower back supported by your chair or a cushion in the curve of your lower back. °¨ Shoulders and arms relaxed and at your sides. °¨ Neck relaxed and not bent forwards or backwards. °¨ Your desk and computer workstation properly adjusted to your body type. °¨   Your chair adjusted so there is no excess pressure on the back of your thighs. °¨ The keyboard resting above your thighs. You should be able to reach the keys with your elbows at your side, bent at a right angle. Your arms should be supported on forearm rests, with your forearms parallel to the ground. °¨ The computer mouse within easy reach. °¨ The monitor directly in front of you, so that your eyes are aligned with the top of the screen. The screen should be about 15 to 25 inches from your eyes. °· While typing, keep your wrist straight, in a neutral position. Move your entire arm when you move your mouse or when typing hard-to-reach keys. °· Only use your computer as much as you need to for work. Do not use it during breaks. °· Take breaks often from any repeated activity. Alternate with another task which requires you to use different muscles, or rest at least once every hour. °· Change positions regularly. If you spend a lot of time sitting, get up, walk around, and stretch. °· Do not hold pens or pencils tightly when  writing. °· Exercise regularly. °· Maintain a normal weight. °· Eat a diet with plenty of vegetables, whole grains, and fruit. °· Get sufficient, restful sleep. °HOME CARE INSTRUCTIONS °· If your caregiver prescribed medicine to help reduce swelling, take it as directed. °· Only take over-the-counter or prescription medicines for pain, discomfort, or fever as directed by your caregiver. °· Reduce, and if needed, stop the activities that are causing your problems until you have no further symptoms. If your symptoms are work-related, you may need to talk to your supervisor about changing your activities. °· When symptoms develop, put ice or a cold pack on the aching area. °¨ Put ice in a plastic bag. °¨ Place a towel between your skin and the bag. °¨ Leave the ice on for 15-20 minutes. °· If you were given a splint to keep your wrist from bending, wear it as instructed. It is important to wear the splint at night. Use the splint for as long as your caregiver recommends. °SEEK MEDICAL CARE IF: °· You develop new problems. °· Your problems do not get better with medicine. °MAKE SURE YOU: °· Understand these instructions. °· Will watch your condition. °· Will get help right away if you are not doing well or get worse. °Document Released: 04/23/2002 Document Revised: 11/02/2011 Document Reviewed: 06/24/2011 °[employer]® Patient Information ©2015 [employer]. This information is not intended to replace advice given to you by your health care provider. Make sure you discuss any questions you have with your health care provider. ° °

## 2014-01-07 ENCOUNTER — Emergency Department (HOSPITAL_COMMUNITY)
Admission: EM | Admit: 2014-01-07 | Discharge: 2014-01-08 | Disposition: A | Discharge status: Home / Self Care | Payer: Self-pay | Attending: Emergency Medicine | Admitting: Emergency Medicine

## 2014-01-07 ENCOUNTER — Encounter (HOSPITAL_COMMUNITY): Payer: Self-pay | Admitting: Emergency Medicine

## 2014-01-07 ENCOUNTER — Ambulatory Visit: Payer: Self-pay | Admitting: Family Medicine

## 2014-01-07 DIAGNOSIS — Z8619 Personal history of other infectious and parasitic diseases: Secondary | ICD-10-CM | POA: Insufficient documentation

## 2014-01-07 DIAGNOSIS — M12869 Other specific arthropathies, not elsewhere classified, unspecified knee: Secondary | ICD-10-CM | POA: Insufficient documentation

## 2014-01-07 DIAGNOSIS — M25561 Pain in right knee: Secondary | ICD-10-CM

## 2014-01-07 DIAGNOSIS — Z8719 Personal history of other diseases of the digestive system: Secondary | ICD-10-CM | POA: Insufficient documentation

## 2014-01-07 DIAGNOSIS — F411 Generalized anxiety disorder: Secondary | ICD-10-CM | POA: Insufficient documentation

## 2014-01-07 DIAGNOSIS — F3289 Other specified depressive episodes: Secondary | ICD-10-CM | POA: Insufficient documentation

## 2014-01-07 DIAGNOSIS — Z87448 Personal history of other diseases of urinary system: Secondary | ICD-10-CM | POA: Insufficient documentation

## 2014-01-07 DIAGNOSIS — F329 Major depressive disorder, single episode, unspecified: Secondary | ICD-10-CM | POA: Insufficient documentation

## 2014-01-07 DIAGNOSIS — J45909 Unspecified asthma, uncomplicated: Secondary | ICD-10-CM | POA: Insufficient documentation

## 2014-01-07 DIAGNOSIS — Z87440 Personal history of urinary (tract) infections: Secondary | ICD-10-CM | POA: Insufficient documentation

## 2014-01-07 DIAGNOSIS — Z791 Long term (current) use of non-steroidal anti-inflammatories (NSAID): Secondary | ICD-10-CM | POA: Insufficient documentation

## 2014-01-07 DIAGNOSIS — Z91040 Latex allergy status: Secondary | ICD-10-CM | POA: Insufficient documentation

## 2014-01-07 DIAGNOSIS — Z79899 Other long term (current) drug therapy: Secondary | ICD-10-CM | POA: Insufficient documentation

## 2014-01-07 DIAGNOSIS — M25569 Pain in unspecified knee: Secondary | ICD-10-CM | POA: Insufficient documentation

## 2014-01-07 DIAGNOSIS — G43909 Migraine, unspecified, not intractable, without status migrainosus: Secondary | ICD-10-CM | POA: Insufficient documentation

## 2014-01-07 NOTE — ED Notes (Signed)
PT reports swelling/pain to right knee x 3 weeks. Seen at [facility] for same and dx with tendonitis. Reports pain worse with standing and palpation to area. Pulses/sensation intact. NAD. AO x4.

## 2014-01-08 MED ORDER — NAPROXEN 500 MG PO TABS: 500.0000 mg | ORAL_TABLET | Freq: Two times a day (BID) | ORAL | Status: DC

## 2014-01-08 MED ORDER — HYDROCODONE-ACETAMINOPHEN 5-325 MG PO TABS: 1.0000 | ORAL_TABLET | Freq: Four times a day (QID) | ORAL | Status: DC | PRN

## 2014-01-08 NOTE — ED Provider Notes (Signed)
CSN: [id number]     Arrival date & time 01/07/14  2210 History   First MD Initiated Contact with Patient 01/07/14 2228     Chief Complaint  Patient presents with  . Knee Pain     (Consider location/radiation/quality/duration/timing/severity/associated sxs/prior Treatment) HPI Comments: The patient is a 28 year old female presents emergency room chief complaint of right knee pain for 3 weeks. The patient denies injury. Reports being evaluated by urgent care several days ago for similar complaints.  She reports standing and wearing steel toed shoes at work. She reports wearing knee sleeve, given by UC. Has not followed up with specialist.  The history is provided by the patient. No language interpreter was used.    Past Medical History  Diagnosis Date  . Asthma   . Migraine   . Reflux   . Anxiety   . Anxiety   . Environmental allergies   . Acid reflux   . Urinary tract infection   . Depression     no meds, currenly ok  . Ovarian cyst   . Chlamydia   . Miscarriage   . Ulcer of the stomach and intestine   . Migraines    Past Surgical History  Procedure Laterality Date  . Wisdom tooth extraction    . Colonoscopy    . Multiple tooth extractions     Family History  Problem Relation Age of Onset  . Hypotension Neg Hx   . Anesthesia problems Neg Hx   . Malignant hyperthermia Neg Hx   . Pseudochol deficiency Neg Hx   . Asthma Mother   . Heart disease Mother   . Diabetes Mother   . Hypertension Mother   . Asthma Maternal Aunt   . Hypertension Maternal Aunt   . Diabetes Maternal Aunt   . Asthma Maternal Uncle   . Hypertension Maternal Uncle   . Diabetes Maternal Uncle   . Heart disease Maternal Grandmother   . Diabetes Maternal Grandmother   . Hypertension Maternal Grandmother   . Heart disease Maternal Grandfather   . Diabetes Maternal Grandfather   . Hypertension Maternal Grandfather    History  Substance Use Topics  . Smoking status: Never Smoker   . Smokeless  tobacco: Never Used  . Alcohol Use: No   OB History   Grav Para Term Preterm Abortions TAB SAB Ect Mult Living   0 2 0 2 0 0 3     Review of Systems  Constitutional: Negative for fever and chills.  Musculoskeletal: Positive for arthralgias. Negative for joint swelling.  Skin: Negative for color change and wound.      Allergies  Other; Latex; and Zofran  Home Medications   Prior to Admission medications   Medication Sig Start Date End Date Taking? Authorizing Provider  albuterol (PROVENTIL HFA;VENTOLIN HFA) 108 (90 BASE) MCG/ACT inhaler Inhale 2 puffs into the lungs every 6 (six) hours as needed for wheezing or shortness of breath.    Historical Provider, MD  diclofenac sodium (VOLTAREN) 1 % GEL Apply 1 application topically 4 (four) times daily. 01/03/14   Hayden Rasmussen, NP  meloxicam (MOBIC) 15 MG tablet Take 1 tablet (15 mg total) by mouth daily. 01/03/14   Hayden Rasmussen, NP  SUMAtriptan (IMITREX) 25 MG tablet Take 2 tablets (50 mg total) by mouth every 2 (two) hours as needed for migraine (ongoing headache). Maximum daily dose  11/29/13   Vida Roller, MD  traMADol (ULTRAM) 50 MG tablet Take 1 tablet (50 mg  total) by mouth every 6 (six) hours as needed. 01/03/14   Hayden Rasmussen, NP   BP 143/68  Pulse 89  Temp(Src) 99 F (37.2 C) (Oral)  Resp 18  SpO2 100% Physical Exam  Nursing note and vitals reviewed. Constitutional: She is oriented to person, place, and time. She appears well-developed and well-nourished.  Non-toxic appearance. She does not have a sickly appearance. She does not appear ill. No distress.  HENT:  Head: Normocephalic and atraumatic.  Eyes: EOM are normal. Pupils are equal, round, and reactive to light.  Neck: Normal range of motion. Neck supple.  Cardiovascular:  No lower extremity edema  Pulmonary/Chest: Effort normal. No respiratory distress.  Musculoskeletal: Normal range of motion. She exhibits no edema.       Right knee: She exhibits normal range  of motion, no swelling, no effusion, no ecchymosis, no deformity, no erythema and normal alignment. Tenderness found.  Mild patellar tenderness. No increase in warmth, no overlying erythema. No crepitus. No calf tenderness.  Neurological: She is alert and oriented to person, place, and time.  Skin: Skin is warm and dry. She is not diaphoretic.  Psychiatric: She has a normal mood and affect.    ED Course  Procedures (including critical care time) Labs Review Labs Reviewed - No data to display  Imaging Review No results found.   EKG Interpretation None      MDM   Final diagnoses:  Knee pain, right   Patient presents with nontraumatic pain of right knee, no signs of infection. Discussed follow up with orthopedist, knee sleeve, RICE encouraged. Meds given in ED:  Medications - No data to display  New Prescriptions   HYDROCODONE-ACETAMINOPHEN (NORCO/VICODIN) 5-325 MG PER TABLET    Take 1 tablet by mouth every 6 (six) hours as needed for moderate pain or severe pain.   NAPROXEN (NAPROSYN) 500 MG TABLET    Take 1 tablet (500 mg total) by mouth 2 (two) times daily with a meal.         Mellody Drown, PA-C 01/08/14 [phone]

## 2014-01-08 NOTE — ED Notes (Signed)
Declined W/C at D/C and was escorted to lobby by RN. 

## 2014-01-08 NOTE — ED Provider Notes (Signed)
Medical screening examination/treatment/procedure(s) were performed by non-physician practitioner and as supervising physician I was immediately available for consultation/collaboration.   EKG Interpretation None        Tomasita Crumble, MD 01/08/14 [phone]

## 2014-01-08 NOTE — Discharge Instructions (Signed)
Call for a follow up appointment with a Family or Primary Care Provider.  Return if Symptoms worsen.   Take medication as prescribed.  Do not operate heavy machinery, or drink alcohol while taking Norco. Elevate your leg above your heart 3-4 times a day, while icing.

## 2014-01-11 ENCOUNTER — Encounter: Payer: Self-pay | Admitting: Family Medicine

## 2014-01-11 ENCOUNTER — Ambulatory Visit: Payer: Self-pay | Attending: Family Medicine | Admitting: Family Medicine

## 2014-01-11 VITALS — BP 119/77 | HR 66 | Temp 98.2°F | Resp 16 | Ht 62.0 in | Wt 214.6 lb

## 2014-01-11 DIAGNOSIS — R51 Headache: Secondary | ICD-10-CM | POA: Insufficient documentation

## 2014-01-11 DIAGNOSIS — Z113 Encounter for screening for infections with a predominantly sexual mode of transmission: Secondary | ICD-10-CM

## 2014-01-11 DIAGNOSIS — R079 Chest pain, unspecified: Secondary | ICD-10-CM | POA: Insufficient documentation

## 2014-01-11 DIAGNOSIS — M76899 Other specified enthesopathies of unspecified lower limb, excluding foot: Secondary | ICD-10-CM

## 2014-01-11 DIAGNOSIS — Z1159 Encounter for screening for other viral diseases: Secondary | ICD-10-CM | POA: Insufficient documentation

## 2014-01-11 DIAGNOSIS — I209 Angina pectoris, unspecified: Secondary | ICD-10-CM

## 2014-01-11 DIAGNOSIS — R519 Headache, unspecified: Secondary | ICD-10-CM | POA: Insufficient documentation

## 2014-01-11 DIAGNOSIS — M25561 Pain in right knee: Secondary | ICD-10-CM | POA: Insufficient documentation

## 2014-01-11 DIAGNOSIS — M25569 Pain in unspecified knee: Secondary | ICD-10-CM | POA: Insufficient documentation

## 2014-01-11 DIAGNOSIS — M7051 Other bursitis of knee, right knee: Secondary | ICD-10-CM

## 2014-01-11 DIAGNOSIS — F17200 Nicotine dependence, unspecified, uncomplicated: Secondary | ICD-10-CM | POA: Insufficient documentation

## 2014-01-11 DIAGNOSIS — R0789 Other chest pain: Secondary | ICD-10-CM | POA: Insufficient documentation

## 2014-01-11 LAB — BASIC METABOLIC PANEL
BUN: 9 mg/dL (ref 6–23)
CALCIUM: 9.1 mg/dL (ref 8.4–10.5)
CHLORIDE: 105 meq/L (ref 96–112)
CO2: 25 mEq/L (ref 19–32)
Creat: 0.65 mg/dL (ref 0.50–1.10)
Glucose, Bld: 76 mg/dL (ref 70–99)
Potassium: 4.1 mEq/L (ref 3.5–5.3)
SODIUM: 138 meq/L (ref 135–145)

## 2014-01-11 LAB — CBC
HCT: 35.6 % — ABNORMAL LOW (ref 36.0–46.0)
Hemoglobin: 11.6 g/dL — ABNORMAL LOW (ref 12.0–15.0)
MCH: 26 pg (ref 26.0–34.0)
MCHC: 32.6 g/dL (ref 30.0–36.0)
MCV: 79.8 fL (ref 78.0–100.0)
PLATELETS: 331 10*3/uL (ref 150–400)
RBC: 4.46 MIL/uL (ref 3.87–5.11)
RDW: 14.6 % (ref 11.5–15.5)
WBC: 8.2 10*3/uL (ref 4.0–10.5)

## 2014-01-11 MED ORDER — MELOXICAM 15 MG PO TABS: 15.0000 mg | ORAL_TABLET | Freq: Every day | ORAL | Status: DC

## 2014-01-11 MED ORDER — PROPRANOLOL HCL 20 MG PO TABS: 40.0000 mg | ORAL_TABLET | Freq: Two times a day (BID) | ORAL | Status: DC

## 2014-01-11 MED ORDER — OMEPRAZOLE 40 MG PO CPDR: 40.0000 mg | DELAYED_RELEASE_CAPSULE | Freq: Every day | ORAL | Status: DC

## 2014-01-11 NOTE — Assessment & Plan Note (Signed)
One time HIV  

## 2014-01-11 NOTE — Progress Notes (Signed)
   Subjective:    Patient ID: Laurie Reed, female    DOB: Oct 12, 1985, 28 y.o.   MRN: [id number]  HPI 1. Headache Patient presents for evaluation of headache. Symptoms began about 4 months ago. Generally, the headaches last about all day  and occur every day. The headaches do not seem to be related to any time of day or year. The headaches are usually sharp and burning  and are located in all over head, but start on the top of head and forehead.  The patient rates her most severe headaches a 10 on a scale from 1 to 10. Recently, the headaches have been decreasing in severity. Work attendance or other daily activities are not affected by the headaches. Precipitating factors include: hot temperatures and stress. The headaches are usually not preceded by an aura. Associated neurologic symptoms: blurred vision, nausea, generalized weakness. The patient denies dizziness, muscle weakness, numbness of extremities, speech difficulties and vomiting in the early morning. Home treatment has included ibuprofen, Imitrex oral and vicodin and resting with some improvement. Other history includes: migraine headaches diagnosed in the past at age 44. Family history includes no known family members with significant headaches.  2. Chest pain: x 5 years. Sharp, substernal, occurs 3-4 times per week. Better with rest. Exacerbated by stress and high physical activity. Associated with shortness of breath, cough, vomiting white mucus.    3. R Knee pain: x 4 weeks. No injury.  Pain started at work. Burning and swelling in knee. Anterior pain, radiates to feet. Associated with tingling in toes. No recent STDs. No fever.   Soc hx: chronic non smoker Review of Systems As pr HPI     Objective:   Physical Exam BP 119/77  Pulse 66  Temp(Src) 98.2 F (36.8 C) (Oral)  Resp 16  Ht  (1.575 m)  Wt 214 lb 9.6 oz (97.342 kg)  BMI 39.24 kg/m2  SpO2 100%  LMP 12/16/2013  Breastfeeding? No General appearance: alert,  cooperative and no distress Neck: no adenopathy, supple, symmetrical, trachea midline and thyroid not enlarged, symmetric, no tenderness/mass/nodules Lungs: clear to auscultation bilaterally Heart: regular rate and rhythm, S1, S2 normal, no murmur, click, rub or gallop Extremities: R knee with no swelling or warmth, Full ROM, tenderness inferior knee along patella tendon. Full strength.  Neurologic: Alert and oriented X 3, normal strength and tone. Normal symmetric reflexes. Normal coordination and gait       Assessment & Plan:

## 2014-01-11 NOTE — Patient Instructions (Signed)
Ms. Laurie Reed,  Thank you for coming in today. It was a pleasure meeting you. I look forward to being your primary doctor.   1. Headache: preventative medicine propranolol   2. Chest pain: from anxiety and stress. Less likely cardiac, normal EKG. Plan for propranolol.  3. Knee pain: patellar tendinitis vs bursitis: x-ray, prescription strength antiinflammatory with mobic, take with prilosec to prevent stomach irritation. Knee x-ray. Buy an ace wrap at the drug store and keep compression on, apply at night.  See me in f/u in 10 days.   Letter for reduced work hours for next 14 days.  Dr. Armen Pickup

## 2014-01-11 NOTE — Assessment & Plan Note (Signed)
3. Knee pain: patellar tendinitis vs bursitis: x-ray, prescription strength antiinflammatory with mobic, take with prilosec to prevent stomach irritation. Knee x-ray. Buy an ace wrap at the drug store and keep compression on, apply at night.  See me in f/u in 10 days.   Letter for reduced work hours for next 14 day

## 2014-01-11 NOTE — Assessment & Plan Note (Signed)
Chest pain: from anxiety and stress. Less likely cardiac, normal EKG. Plan for propranolol.

## 2014-01-11 NOTE — Progress Notes (Signed)
Establish Care C/C chest paint, HA for about 4 month, swelling and burning on rt Knee traveling tort  feet

## 2014-01-11 NOTE — Assessment & Plan Note (Addendum)
A: suspect intractable tension headache vs cluster headache. Overall the course is improving.  P: preventative medicine, propranolol

## 2014-01-12 LAB — HIV ANTIBODY (ROUTINE TESTING W REFLEX): HIV 1&2 Ab, 4th Generation: NONREACTIVE

## 2014-01-16 ENCOUNTER — Telehealth: Payer: Self-pay | Admitting: *Deleted

## 2014-01-16 NOTE — Telephone Encounter (Signed)
Pt aware of lab results 

## 2014-01-16 NOTE — Telephone Encounter (Signed)
Message copied by Dyann Kief on Wed Jan 16, 2014  5:48 PM ------      Message from: Dessa Phi      Created: Tue Jan 15, 2014  1:30 PM       Please inform patient.       Negative HIV, stable Hgb but slightly low will plan for f/u iron studies, normal BMP. ------

## 2014-01-25 ENCOUNTER — Ambulatory Visit: Payer: Self-pay | Admitting: Family Medicine

## 2014-01-29 ENCOUNTER — Ambulatory Visit: Payer: Self-pay

## 2014-03-18 ENCOUNTER — Encounter: Payer: Self-pay | Admitting: Family Medicine

## 2014-09-09 ENCOUNTER — Encounter (HOSPITAL_COMMUNITY): Payer: Self-pay | Admitting: Emergency Medicine

## 2014-09-09 ENCOUNTER — Emergency Department (INDEPENDENT_AMBULATORY_CARE_PROVIDER_SITE_OTHER)
Admission: EM | Admit: 2014-09-09 | Discharge: 2014-09-09 | Disposition: A | Discharge status: Nursing Home (Includes ICF) | Payer: Self-pay | Source: Home / Self Care | Attending: Emergency Medicine | Admitting: Emergency Medicine

## 2014-09-09 DIAGNOSIS — Z79899 Other long term (current) drug therapy: Secondary | ICD-10-CM | POA: Insufficient documentation

## 2014-09-09 DIAGNOSIS — H538 Other visual disturbances: Secondary | ICD-10-CM

## 2014-09-09 DIAGNOSIS — Z91040 Latex allergy status: Secondary | ICD-10-CM | POA: Insufficient documentation

## 2014-09-09 DIAGNOSIS — Z8619 Personal history of other infectious and parasitic diseases: Secondary | ICD-10-CM | POA: Insufficient documentation

## 2014-09-09 DIAGNOSIS — Z8742 Personal history of other diseases of the female genital tract: Secondary | ICD-10-CM | POA: Insufficient documentation

## 2014-09-09 DIAGNOSIS — Z87440 Personal history of urinary (tract) infections: Secondary | ICD-10-CM | POA: Insufficient documentation

## 2014-09-09 DIAGNOSIS — J45909 Unspecified asthma, uncomplicated: Secondary | ICD-10-CM | POA: Insufficient documentation

## 2014-09-09 DIAGNOSIS — Z8659 Personal history of other mental and behavioral disorders: Secondary | ICD-10-CM | POA: Insufficient documentation

## 2014-09-09 DIAGNOSIS — K219 Gastro-esophageal reflux disease without esophagitis: Secondary | ICD-10-CM | POA: Insufficient documentation

## 2014-09-09 DIAGNOSIS — R51 Headache: Secondary | ICD-10-CM

## 2014-09-09 DIAGNOSIS — G43909 Migraine, unspecified, not intractable, without status migrainosus: Secondary | ICD-10-CM | POA: Insufficient documentation

## 2014-09-09 DIAGNOSIS — R519 Headache, unspecified: Secondary | ICD-10-CM

## 2014-09-09 MED ORDER — SULFAMETHOXAZOLE-TRIMETHOPRIM 800-160 MG PO TABS: 1.0000 | ORAL_TABLET | Freq: Two times a day (BID) | ORAL | Status: AC

## 2014-09-09 NOTE — ED Notes (Signed)
Pt states that her head has been hurting fir [redacted] weeks along with blurred vision

## 2014-09-09 NOTE — ED Notes (Signed)
Onset 3 weeks ago constant headache and eye pressure. Seen at urgent care and stated eye pressure is high and has and eye doctor on Wednesday. Sent to ED for further evaluation.

## 2014-09-09 NOTE — Discharge Instructions (Signed)
Follow up with Dr. Delaney MeigsStonecipher on Wednesday at 8am. His office is at [address]; [address].  Take bactrim twice a day for 10 days.

## 2014-09-09 NOTE — ED Provider Notes (Addendum)
CSN: [id number]     Arrival date & time 09/09/14  1515 History   First MD Initiated Contact with Patient 09/09/14 1613     Chief Complaint  Patient presents with  . Blurred Vision  . Headache   (Consider location/radiation/quality/duration/timing/severity/associated sxs/prior Treatment) HPI  She is a 29 year old woman here for evaluation of headache and blurred vision. She states this started about 3 weeks ago. She has had daily unrelenting headache and blurred vision for the 3 weeks. She states she wakes up every morning with a headache. She has tried Tylenol without improvement. The headache is described as both the contents pressure behind her eyes as well as a throbbing. She is sensitive to light. She does report some nausea. No focal numbness, tingling, weakness. Prior to 3 weeks ago, she denies any blurred vision. Denies double vision. No fevers. She also reports fatigue.  She has also had pustules in her scalp. These are not associated with hair loss. Several of them have ruptured. No fevers.  Past Medical History  Diagnosis Date  . Migraine   . Reflux   . Environmental allergies   . Urinary tract infection   . Ovarian cyst   . Chlamydia   . Miscarriage   . Migraines   . Anxiety 2008  . Depression 2008    no meds, currenly ok  . Acid reflux 2004  . Ulcer of the stomach and intestine 2004  . Asthma 1993    no previous intubations or hospitalizations    Past Surgical History  Procedure Laterality Date  . Wisdom tooth extraction  2007  . Colonoscopy  2004   . Multiple tooth extractions  2015     3 teeth removed    Family History  Problem Relation Age of Onset  . Hypotension Neg Hx   . Anesthesia problems Neg Hx   . Malignant hyperthermia Neg Hx   . Pseudochol deficiency Neg Hx   . Cancer Neg Hx   . Asthma Mother   . Heart disease Mother   . Diabetes Mother   . Hypertension Mother   . Asthma Maternal Aunt   . Hypertension Maternal Aunt   . Diabetes Maternal Aunt    . Asthma Maternal Uncle   . Hypertension Maternal Uncle   . Diabetes Maternal Uncle   . Heart disease Maternal Grandmother   . Diabetes Maternal Grandmother   . Hypertension Maternal Grandmother   . Heart disease Maternal Grandfather   . Diabetes Maternal Grandfather   . Hypertension Maternal Grandfather   . Hypertension Father    History  Substance Use Topics  . Smoking status: Never Smoker   . Smokeless tobacco: Never Used  . Alcohol Use: No   OB History    Gravida Para Term Preterm AB TAB SAB Ectopic Multiple Living   5 3 3  0 2 0 2 0 0 3     Review of Systems As in history of present illness Allergies  Other; Latex; and Zofran  Home Medications   Prior to Admission medications   Medication Sig Start Date End Date Taking? Authorizing Provider  albuterol (PROVENTIL HFA;VENTOLIN HFA) 108 (90 BASE) MCG/ACT inhaler Inhale 2 puffs into the lungs every 6 (six) hours as needed for wheezing or shortness of breath.    Historical Provider, MD  diclofenac sodium (VOLTAREN) 1 % GEL Apply 1 application topically 4 (four) times daily. 01/03/14   Hayden Rasmussenavid Mabe, NP  HYDROcodone-acetaminophen (NORCO/VICODIN) 5-325 MG per tablet Take 1 tablet by mouth every  6 (six) hours as needed for moderate pain or severe pain. 01/08/14   Mellody Drown, PA-C  meloxicam (MOBIC) 15 MG tablet Take 1 tablet (15 mg total) by mouth daily. 01/11/14   Josalyn Funches, MD  omeprazole (PRILOSEC) 40 MG capsule Take 1 capsule (40 mg total) by mouth daily. 01/11/14   Dessa Phi, MD  propranolol (INDERAL) 20 MG tablet Take 2 tablets (40 mg total) by mouth 2 (two) times daily. 01/11/14   Josalyn Funches, MD  sulfamethoxazole-trimethoprim (BACTRIM DS,SEPTRA DS) 800-160 MG per tablet Take 1 tablet by mouth 2 (two) times daily. 09/09/14 09/16/14  Charm Rings, MD  SUMAtriptan (IMITREX) 25 MG tablet Take 2 tablets (50 mg total) by mouth every 2 (two) hours as needed for migraine (ongoing headache). Maximum daily dose   11/29/13   Eber Hong, MD   BP 118/78 mmHg  Pulse 94  Temp(Src) 98.4 F (36.9 C) (Oral)  Resp 18  SpO2 100% Physical Exam  Constitutional: She is oriented to person, place, and time. She appears well-developed and well-nourished. No distress.  Appears very fatigued.  HENT:  Scattered pustules on scalp.  Eyes: Conjunctivae and EOM are normal. Pupils are equal, round, and reactive to light. Right eye exhibits no discharge. Left eye exhibits no discharge.  Tono-Pen measures a pressure of 24 in the right eye and 30 in the left eye  Neck: Neck supple.  Neurological: She is alert and oriented to person, place, and time. No cranial nerve deficit. She exhibits normal muscle tone. Coordination normal.  Slightly unsteady tandem gait    ED Course  Procedures (including critical care time) Labs Review Labs Reviewed - No data to display  Imaging Review No results found.   MDM   1. Acute intractable headache, unspecified headache type   2. Blurred vision, bilateral    With elevated intraocular pressures there is concern for glaucoma and this being the cause of her headache. I have called and spoken to Dr. Delaney Meigs, the ophthalmologist on-call, who will follow up with her in his office Wednesday morning. However, he states the pressures are not high enough for him to think they are causing the headache. I have given her a prescription for Bactrim to use for the scalp abscesses. We'll transfer to [facility] ER via shuttle for additional evaluation and management of the headache. I think she needs a CT scan.    Charm Rings, MD 09/09/14 1721  Charm Rings, MD 09/09/14 [phone]

## 2014-09-10 ENCOUNTER — Emergency Department (HOSPITAL_COMMUNITY): Payer: Self-pay

## 2014-09-10 ENCOUNTER — Emergency Department (HOSPITAL_COMMUNITY)
Admission: EM | Admit: 2014-09-10 | Discharge: 2014-09-10 | Disposition: A | Discharge status: Home / Self Care | Payer: Self-pay | Attending: Emergency Medicine | Admitting: Emergency Medicine

## 2014-09-10 DIAGNOSIS — R519 Headache, unspecified: Secondary | ICD-10-CM

## 2014-09-10 DIAGNOSIS — R51 Headache: Secondary | ICD-10-CM

## 2014-09-10 MED ORDER — HYDROCODONE-ACETAMINOPHEN 5-325 MG PO TABS: 1.0000 | ORAL_TABLET | ORAL | Status: DC | PRN

## 2014-09-10 MED ORDER — METOCLOPRAMIDE HCL 5 MG/ML IJ SOLN
10.0000 mg | Freq: Once | INTRAMUSCULAR | Status: AC
  Administered 2014-09-10: 10 mg via INTRAMUSCULAR
  Filled 2014-09-10: qty 2

## 2014-09-10 MED ORDER — METOCLOPRAMIDE HCL 5 MG/ML IJ SOLN
10.0000 mg | Freq: Once | INTRAMUSCULAR | Status: DC
Start: 2014-09-10 — End: 2014-09-10

## 2014-09-10 MED ORDER — KETOROLAC TROMETHAMINE 30 MG/ML IJ SOLN: 30.0000 mg | Freq: Once | INTRAMUSCULAR | Status: DC

## 2014-09-10 MED ORDER — MORPHINE SULFATE 4 MG/ML IJ SOLN: 4.0000 mg | Freq: Once | INTRAMUSCULAR | Status: DC

## 2014-09-10 MED ORDER — HYDROMORPHONE HCL 1 MG/ML IJ SOLN
1.0000 mg | Freq: Once | INTRAMUSCULAR | Status: DC
  Filled 2014-09-10: qty 1

## 2014-09-10 MED ORDER — KETOROLAC TROMETHAMINE 60 MG/2ML IM SOLN
60.0000 mg | Freq: Once | INTRAMUSCULAR | Status: AC
  Administered 2014-09-10: 60 mg via INTRAMUSCULAR
  Filled 2014-09-10: qty 2

## 2014-09-10 NOTE — ED Notes (Signed)
Pt wants to wait on the dilaudid since she has to drive with her kids.

## 2014-09-10 NOTE — ED Notes (Signed)
Pt difficult to arouse 

## 2014-09-10 NOTE — Discharge Instructions (Signed)

## 2014-09-10 NOTE — ED Provider Notes (Signed)
CSN: [id number]     Arrival date & time 09/09/14  1805 History  This chart was scribed for Azalia BilisKevin Linsey Arteaga, MD by Annye AsaAnna Dorsett, ED Scribe. This patient was seen in room B18C/B18C and the patient's care was started at 1:27 AM.     Chief Complaint  Patient presents with  . Headache   Patient is a 29 y.o. female presenting with headaches. The history is provided by the patient. No language interpreter was used.  Headache    HPI Comments: Laurie Reed is a 29 y.o. female who presents to the Emergency Department complaining of 3 weeks of constant headache and generalized weakness. She localizes her headache to the "top and the front" of her head. She reports associated visual disturbance (blurred vision). She has taken Tylenol and ibuprofen without relief. Patient reports she was seen at a local urgent care yesterday for her symptoms; at that time, her eye pressure was checked and found to be "high." She was recommended to see the on-call ophthalmologist on 09/11/14. She reports prior experience with migraine headaches. She denies chest pain, abdominal pain, nausea, vomiting, fevers, chills, cough, congestion, unilateral weakness.   Past Medical History  Diagnosis Date  . Migraine   . Reflux   . Environmental allergies   . Urinary tract infection   . Ovarian cyst   . Chlamydia   . Miscarriage   . Migraines   . Anxiety 2008  . Depression 2008    no meds, currenly ok  . Acid reflux 2004  . Ulcer of the stomach and intestine 2004  . Asthma 1993    no previous intubations or hospitalizations    Past Surgical History  Procedure Laterality Date  . Wisdom tooth extraction  2007  . Colonoscopy  2004   . Multiple tooth extractions  2015     3 teeth removed    Family History  Problem Relation Age of Onset  . Hypotension Neg Hx   . Anesthesia problems Neg Hx   . Malignant hyperthermia Neg Hx   . Pseudochol deficiency Neg Hx   . Cancer Neg Hx   . Asthma Mother   . Heart disease Mother    . Diabetes Mother   . Hypertension Mother   . Asthma Maternal Aunt   . Hypertension Maternal Aunt   . Diabetes Maternal Aunt   . Asthma Maternal Uncle   . Hypertension Maternal Uncle   . Diabetes Maternal Uncle   . Heart disease Maternal Grandmother   . Diabetes Maternal Grandmother   . Hypertension Maternal Grandmother   . Heart disease Maternal Grandfather   . Diabetes Maternal Grandfather   . Hypertension Maternal Grandfather   . Hypertension Father    History  Substance Use Topics  . Smoking status: Never Smoker   . Smokeless tobacco: Never Used  . Alcohol Use: No   OB History    Gravida Para Term Preterm AB TAB SAB Ectopic Multiple Living   5 3 3  0 2 0 2 0 0 3     Review of Systems   A complete 10 system review of systems was obtained and all systems are negative except as noted in the HPI and PMH.     Allergies  Other; Latex; and Zofran  Home Medications   Prior to Admission medications   Medication Sig Start Date End Date Taking? Authorizing Provider  albuterol (PROVENTIL HFA;VENTOLIN HFA) 108 (90 BASE) MCG/ACT inhaler Inhale 2 puffs into the lungs every 6 (six) hours as needed  for wheezing or shortness of breath.   Yes Historical Provider, MD  diclofenac sodium (VOLTAREN) 1 % GEL Apply 1 application topically 4 (four) times daily. Patient not taking: Reported on 09/09/2014 01/03/14   Hayden Rasmussen, NP  HYDROcodone-acetaminophen (NORCO/VICODIN) 5-325 MG per tablet Take 1 tablet by mouth every 6 (six) hours as needed for moderate pain or severe pain. Patient not taking: Reported on 09/09/2014 01/08/14   Mellody Drown, PA-C  meloxicam (MOBIC) 15 MG tablet Take 1 tablet (15 mg total) by mouth daily. Patient not taking: Reported on 09/09/2014 01/11/14   Dessa Phi, MD  omeprazole (PRILOSEC) 40 MG capsule Take 1 capsule (40 mg total) by mouth daily. Patient not taking: Reported on 09/09/2014 01/11/14   Dessa Phi, MD  propranolol (INDERAL) 20 MG tablet Take 2 tablets  (40 mg total) by mouth 2 (two) times daily. Patient not taking: Reported on 09/09/2014 01/11/14   Dessa Phi, MD  sulfamethoxazole-trimethoprim (BACTRIM DS,SEPTRA DS) 800-160 MG per tablet Take 1 tablet by mouth 2 (two) times daily. Patient not taking: Reported on 09/09/2014 09/09/14 09/16/14  Charm Rings, MD  SUMAtriptan (IMITREX) 25 MG tablet Take 2 tablets (50 mg total) by mouth every 2 (two) hours as needed for migraine (ongoing headache). Maximum daily dose  Patient not taking: Reported on 09/09/2014 11/29/13   Eber Hong, MD   BP 107/61 mmHg  Pulse 69  Temp(Src) 98.3 F (36.8 C) (Oral)  Resp 18  Ht  (1.575 m)  Wt 238 lb (107.956 kg)  BMI 43.52 kg/m2  SpO2 100% Physical Exam  Constitutional: She is oriented to person, place, and time. She appears well-developed and well-nourished. No distress.  HENT:  Head: Normocephalic and atraumatic.  Right TM normal; left TM impacted with cerumen. Posterior pharynx normal.   Eyes: EOM are normal. Pupils are equal, round, and reactive to light.  Neck: Normal range of motion.  Cardiovascular: Normal rate, regular rhythm and normal heart sounds.   Pulmonary/Chest: Effort normal and breath sounds normal.  Abdominal: Soft. She exhibits no distension. There is no tenderness.  Musculoskeletal: Normal range of motion.  Neurological: She is alert and oriented to person, place, and time.  5/5 strength in major muscle groups of  bilateral upper and lower extremities. Speech normal. No facial asymetry.   Skin: Skin is warm and dry.  Psychiatric: She has a normal mood and affect. Judgment normal.  Nursing note and vitals reviewed.   ED Course  Procedures   DIAGNOSTIC STUDIES: Oxygen Saturation is 99% on RA, normal by my interpretation.    COORDINATION OF CARE: 1:32 AM Discussed treatment plan with pt at bedside and pt agreed to plan.   Labs Review Labs Reviewed - No data to display  Imaging Review Ct Head Wo Contrast  09/10/2014    CLINICAL DATA:  29 year old female with 3 week history of constant headache and eye pressure  EXAM: CT HEAD WITHOUT CONTRAST  TECHNIQUE: Contiguous axial images were obtained from the base of the skull through the vertex without intravenous contrast.  COMPARISON:  Prior head CT 11/27/2013  FINDINGS: Negative for acute intracranial hemorrhage, acute infarction, mass, mass effect, hydrocephalus or midline shift. Gray-white differentiation is preserved throughout. No acute soft tissue or calvarial abnormality. The globes and orbits are symmetric and unremarkable. Normal aeration of the mastoid air cells and visualized paranasal sinuses. Small mucous retention cyst versus polyp in the medial left maxillary sinus.  IMPRESSION: Negative head CT.   Electronically Signed   By: Vilma Prader  Archer Asa M.D.   On: 09/10/2014 02:36  I personally reviewed the imaging tests through PACS system I reviewed available ER/hospitalization records through the EMR    EKG Interpretation None      MDM   Final diagnoses:  Headache, unspecified headache type    Likely migraine variant.  Head CT normal.  Patient will need outpatient neurology follow-up.  Of her headaches continue she may need LP with opening pressures.    I personally performed the services described in this documentation, which was scribed in my presence. The recorded information has been reviewed and is accurate.       Azalia Bilis, MD 09/10/14 [phone]

## 2014-10-08 ENCOUNTER — Ambulatory Visit: Payer: Medicaid Other

## 2014-10-08 ENCOUNTER — Ambulatory Visit: Payer: Self-pay

## 2014-10-08 ENCOUNTER — Ambulatory Visit: Payer: Self-pay | Attending: Family Medicine | Admitting: Family Medicine

## 2014-10-08 VITALS — BP 110/70 | HR 77 | Wt 235.8 lb

## 2014-10-08 DIAGNOSIS — L989 Disorder of the skin and subcutaneous tissue, unspecified: Secondary | ICD-10-CM

## 2014-10-08 MED ORDER — SULFAMETHOXAZOLE-TRIMETHOPRIM 800-160 MG PO TABS: 1.0000 | ORAL_TABLET | Freq: Two times a day (BID) | ORAL | Status: DC

## 2014-10-08 MED ORDER — SUMATRIPTAN SUCCINATE 25 MG PO TABS: ORAL_TABLET | ORAL | Status: DC

## 2014-10-08 MED ORDER — SUMATRIPTAN SUCCINATE 100 MG PO TABS: ORAL_TABLET | ORAL | Status: DC

## 2014-10-08 NOTE — Progress Notes (Signed)
Pt c/o vaginal itching and bumps in the back of her head.

## 2014-10-08 NOTE — Patient Instructions (Addendum)
Use antibiotic prescribed for sore bumps on head Follow-up after period if continues to have itching or discharge May use over the counter med for yeast or vagisil for itching. Make an app

## 2014-10-08 NOTE — Progress Notes (Signed)
Subjective:     Patient ID: Laurie Reed, female   DOB: May 05, 1986, 29 y.o.   MRN: [id number]  HPI   Patient presents today for vaginal itching for a couple of days. She is on her period at this time.  She also c/o tender lumps on her scalp which sometimes drain.Also c/o frequent migraine headaches. She has been on Imitrex in the past. She has been on inderal for prevention but did not find this helpful.would like a refill of Imitrex.   Review of Systems   Denies chest pain, shortness of breath, fever, chills, wt loss, abd pain. Admits to headaches.      Objective:   Physical Exam   Alert, oriented, appropriate, in no distress. There are numerous tender small bumps on her scalp. Skin warm and dry Lungs are clear HS are regular w/o m,g,r, Neuro  Non-focal.      Assessment and plan     1. Scalp pustules     Bactrim DS  2. Vaginal itching     Follow-up after period if vaginal irritation and itching continues 3. MHA    Refill of Imitrex.    Follow-up with Dr. Armen PickupFunches for further assessment.

## 2014-10-08 NOTE — Addendum Note (Signed)
Addended by: Henrietta HooverBERNHARDT, Shoshana Johal C on: 10/08/2014 04:22 PM   Modules accepted: Orders, Medications

## 2014-11-04 ENCOUNTER — Other Ambulatory Visit (HOSPITAL_COMMUNITY)
Admission: RE | Admit: 2014-11-04 | Discharge: 2014-11-04 | Disposition: A | Discharge status: Home / Self Care | Payer: Self-pay | Source: Ambulatory Visit | Attending: Emergency Medicine | Admitting: Emergency Medicine

## 2014-11-04 ENCOUNTER — Encounter (HOSPITAL_COMMUNITY): Payer: Self-pay | Admitting: Emergency Medicine

## 2014-11-04 ENCOUNTER — Emergency Department (INDEPENDENT_AMBULATORY_CARE_PROVIDER_SITE_OTHER)
Admission: EM | Admit: 2014-11-04 | Discharge: 2014-11-04 | Disposition: A | Discharge status: Home / Self Care | Payer: Self-pay | Source: Home / Self Care | Attending: Emergency Medicine | Admitting: Emergency Medicine

## 2014-11-04 ENCOUNTER — Ambulatory Visit: Payer: Self-pay

## 2014-11-04 DIAGNOSIS — L989 Disorder of the skin and subcutaneous tissue, unspecified: Secondary | ICD-10-CM

## 2014-11-04 DIAGNOSIS — B9689 Other specified bacterial agents as the cause of diseases classified elsewhere: Secondary | ICD-10-CM

## 2014-11-04 DIAGNOSIS — Z113 Encounter for screening for infections with a predominantly sexual mode of transmission: Secondary | ICD-10-CM | POA: Insufficient documentation

## 2014-11-04 DIAGNOSIS — B3731 Acute candidiasis of vulva and vagina: Secondary | ICD-10-CM

## 2014-11-04 DIAGNOSIS — N760 Acute vaginitis: Secondary | ICD-10-CM | POA: Insufficient documentation

## 2014-11-04 DIAGNOSIS — B373 Candidiasis of vulva and vagina: Secondary | ICD-10-CM

## 2014-11-04 DIAGNOSIS — A499 Bacterial infection, unspecified: Secondary | ICD-10-CM

## 2014-11-04 MED ORDER — METRONIDAZOLE 500 MG PO TABS: 500.0000 mg | ORAL_TABLET | Freq: Two times a day (BID) | ORAL | Status: DC

## 2014-11-04 MED ORDER — DOXYCYCLINE HYCLATE 100 MG PO CAPS: 100.0000 mg | ORAL_CAPSULE | Freq: Two times a day (BID) | ORAL | Status: DC

## 2014-11-04 MED ORDER — FLUCONAZOLE 150 MG PO TABS: 150.0000 mg | ORAL_TABLET | Freq: Once | ORAL | Status: DC

## 2014-11-04 NOTE — ED Notes (Signed)
C/o vaginal discharge States she feels if she had a hair bump States vaginal area does itch

## 2014-11-04 NOTE — Discharge Instructions (Signed)
You have bacterial vaginosis and a yeast infection. Take Flagyl one pill twice a day for one week.  This is for bacterial vaginosis. Take doxycycline 1 pill twice a day for one week for the scalp. Take a Diflucan pill today for the yeast. Take the second pill after you finish the Flagyl and doxycycline. Follow-up as needed.

## 2014-11-04 NOTE — ED Provider Notes (Signed)
CSN: [id number]     Arrival date & time 11/04/14  1714 History   First MD Initiated Contact with Patient 11/04/14 1747     Chief Complaint  Patient presents with  . Vaginal Discharge   (Consider location/radiation/quality/duration/timing/severity/associated sxs/prior Treatment) HPI  She is a 29 year old woman here for evaluation of vaginal discharge. She reports vaginal itching for the last week or so. She has noticed a creamy discharge in the last few days. No odor. No abdominal pain. She has been taking antibiotics for scalp abscesses.  Past Medical History  Diagnosis Date  . Migraine   . Reflux   . Environmental allergies   . Urinary tract infection   . Ovarian cyst   . Chlamydia   . Miscarriage   . Migraines   . Anxiety 2008  . Depression 2008    no meds, currenly ok  . Acid reflux 2004  . Ulcer of the stomach and intestine 2004  . Asthma 1993    no previous intubations or hospitalizations    Past Surgical History  Procedure Laterality Date  . Wisdom tooth extraction  2007  . Colonoscopy  2004   . Multiple tooth extractions  2015     3 teeth removed    Family History  Problem Relation Age of Onset  . Hypotension Neg Hx   . Anesthesia problems Neg Hx   . Malignant hyperthermia Neg Hx   . Pseudochol deficiency Neg Hx   . Cancer Neg Hx   . Asthma Mother   . Heart disease Mother   . Diabetes Mother   . Hypertension Mother   . Asthma Maternal Aunt   . Hypertension Maternal Aunt   . Diabetes Maternal Aunt   . Asthma Maternal Uncle   . Hypertension Maternal Uncle   . Diabetes Maternal Uncle   . Heart disease Maternal Grandmother   . Diabetes Maternal Grandmother   . Hypertension Maternal Grandmother   . Heart disease Maternal Grandfather   . Diabetes Maternal Grandfather   . Hypertension Maternal Grandfather   . Hypertension Father    History  Substance Use Topics  . Smoking status: Never Smoker   . Smokeless tobacco: Never Used  . Alcohol Use: No   OB  History    Gravida Para Term Preterm AB TAB SAB Ectopic Multiple Living   5 3 3  0 2 0 2 0 0 3     Review of Systems As in history of present illness Allergies  Other; Latex; and Zofran  Home Medications   Prior to Admission medications   Medication Sig Start Date End Date Taking? Authorizing Provider  albuterol (PROVENTIL HFA;VENTOLIN HFA) 108 (90 BASE) MCG/ACT inhaler Inhale 2 puffs into the lungs every 6 (six) hours as needed for wheezing or shortness of breath.    Historical Provider, MD  diclofenac sodium (VOLTAREN) 1 % GEL Apply 1 application topically 4 (four) times daily. Patient not taking: Reported on 09/09/2014 01/03/14   Hayden Rasmussen, NP  doxycycline (VIBRAMYCIN) 100 MG capsule Take 1 capsule (100 mg total) by mouth 2 (two) times daily. 11/04/14   Charm Rings, MD  fluconazole (DIFLUCAN) 150 MG tablet Take 1 tablet (150 mg total) by mouth once. Repeat in 1 week. 11/04/14   Charm Rings, MD  HYDROcodone-acetaminophen (NORCO/VICODIN) 5-325 MG per tablet Take 1 tablet by mouth every 4 (four) hours as needed for moderate pain. Patient not taking: Reported on 10/08/2014 09/10/14   Azalia Bilis, MD  meloxicam [facility]) 15 MG  tablet Take 1 tablet (15 mg total) by mouth daily. Patient not taking: Reported on 09/09/2014 01/11/14   Dessa Phi, MD  metroNIDAZOLE (FLAGYL) 500 MG tablet Take 1 tablet (500 mg total) by mouth 2 (two) times daily. 11/04/14   Charm Rings, MD  omeprazole (PRILOSEC) 40 MG capsule Take 1 capsule (40 mg total) by mouth daily. Patient not taking: Reported on 10/08/2014 01/11/14   Dessa Phi, MD  propranolol (INDERAL) 20 MG tablet Take 2 tablets (40 mg total) by mouth 2 (two) times daily. Patient not taking: Reported on 09/09/2014 01/11/14   Dessa Phi, MD  SUMAtriptan (IMITREX) 100 MG tablet Take one tablet (100 mg) at onset of headache. May repeat in 2 hours if headache not relieved. No more than 2 doses in 24 hours. 10/08/14   Henrietta Hoover, NP   BP 130/64  mmHg  Pulse 93  Temp(Src) 99.5 F (37.5 C) (Oral)  Resp 16  SpO2 98%  LMP 10/08/2014 Physical Exam  Constitutional: She is oriented to person, place, and time. She appears well-developed and well-nourished. No distress.  HENT:  Scalp with multiple subcutaneous nodules. No erythema. Are mildly tender.  Neck: Neck supple.  Cardiovascular: Normal rate.   Pulmonary/Chest: Effort normal.  Abdominal: Soft. Bowel sounds are normal. She exhibits no distension. There is tenderness (mild in left lower quadrant). There is no rebound and no guarding.  Neurological: She is alert and oriented to person, place, and time.    ED Course  Procedures (including critical care time) Labs Review Labs Reviewed  CERVICOVAGINAL ANCILLARY ONLY    Imaging Review No results found.   MDM   1. BV (bacterial vaginosis)   2. Yeast vaginitis   3. Scalp lesion    We'll change her antibiotics to doxycycline for scalp abscesses. Flagyl for BV. Diflucan for yeast. Follow-up as needed.    Charm Rings, MD 11/04/14 [phone]

## 2014-11-05 LAB — CERVICOVAGINAL ANCILLARY ONLY
CHLAMYDIA, DNA PROBE: NEGATIVE
Neisseria Gonorrhea: NEGATIVE

## 2014-11-06 ENCOUNTER — Ambulatory Visit: Payer: Self-pay

## 2014-11-06 LAB — CERVICOVAGINAL ANCILLARY ONLY: WET PREP (BD AFFIRM): POSITIVE — AB

## 2014-11-07 NOTE — ED Notes (Signed)
Patient advised of lab reports, advised to use Rx as written , and to call for any problems

## 2014-12-09 ENCOUNTER — Ambulatory Visit: Payer: Self-pay

## 2014-12-24 ENCOUNTER — Encounter (HOSPITAL_COMMUNITY): Payer: Self-pay | Admitting: Emergency Medicine

## 2014-12-24 ENCOUNTER — Emergency Department (INDEPENDENT_AMBULATORY_CARE_PROVIDER_SITE_OTHER)
Admission: EM | Admit: 2014-12-24 | Discharge: 2014-12-24 | Disposition: A | Discharge status: Home / Self Care | Payer: Self-pay | Source: Home / Self Care | Attending: Emergency Medicine | Admitting: Emergency Medicine

## 2014-12-24 DIAGNOSIS — M7051 Other bursitis of knee, right knee: Secondary | ICD-10-CM

## 2014-12-24 DIAGNOSIS — B350 Tinea barbae and tinea capitis: Secondary | ICD-10-CM

## 2014-12-24 DIAGNOSIS — M25561 Pain in right knee: Secondary | ICD-10-CM

## 2014-12-24 MED ORDER — GRISEOFULVIN MICROSIZE 500 MG PO TABS: 500.0000 mg | ORAL_TABLET | Freq: Every day | ORAL | Status: DC

## 2014-12-24 MED ORDER — FUROSEMIDE 20 MG PO TABS: 20.0000 mg | ORAL_TABLET | Freq: Every day | ORAL | Status: DC | PRN

## 2014-12-24 MED ORDER — MELOXICAM 15 MG PO TABS: 15.0000 mg | ORAL_TABLET | Freq: Every day | ORAL | Status: DC

## 2014-12-24 MED ORDER — METHYLPREDNISOLONE ACETATE 40 MG/ML IJ SUSP
INTRAMUSCULAR | Status: AC
  Filled 2014-12-24: qty 1

## 2014-12-24 NOTE — Discharge Instructions (Signed)
I think the spots on your neck and scalp are a fungal infection. Take griseofulvin daily for 6 weeks.  We did a cortisone injection in your knee today. Please put ice on it tonight. Meloxicam daily as needed for discomfort. Use Lasix daily as needed for swelling. You can try following up at the sports medicine center.

## 2014-12-24 NOTE — ED Provider Notes (Signed)
CSN: [id number]     Arrival date & time 12/24/14  1637 History   First MD Initiated Contact with Patient 12/24/14 1832     Chief Complaint  Patient presents with  . Leg Pain   (Consider location/radiation/quality/duration/timing/severity/associated sxs/prior Treatment) HPI Laurie Reed is a 29 year old woman here for evaluation of right leg pain. Laurie Reed reports a long history of intermittent swelling in her legs and pain, particularly in her right knee. Laurie Reed does work on her feet all day. Laurie Reed has been referred to orthopedics in the past, but cannot afford to go as Laurie Reed does not have insurance.  Laurie Reed also reports continued pustules on her scalp and posterior neck. Laurie Reed has been treated with both Bactrim and doxycycline without any improvement.  Past Medical History  Diagnosis Date  . Migraine   . Reflux   . Environmental allergies   . Urinary tract infection   . Ovarian cyst   . Chlamydia   . Miscarriage   . Migraines   . Anxiety 2008  . Depression 2008    no meds, currenly ok  . Acid reflux 2004  . Ulcer of the stomach and intestine 2004  . Asthma 1993    no previous intubations or hospitalizations    Past Surgical History  Procedure Laterality Date  . Wisdom tooth extraction  2007  . Colonoscopy  2004   . Multiple tooth extractions  2015     3 teeth removed    Family History  Problem Relation Age of Onset  . Hypotension Neg Hx   . Anesthesia problems Neg Hx   . Malignant hyperthermia Neg Hx   . Pseudochol deficiency Neg Hx   . Cancer Neg Hx   . Asthma Mother   . Heart disease Mother   . Diabetes Mother   . Hypertension Mother   . Asthma Maternal Aunt   . Hypertension Maternal Aunt   . Diabetes Maternal Aunt   . Asthma Maternal Uncle   . Hypertension Maternal Uncle   . Diabetes Maternal Uncle   . Heart disease Maternal Grandmother   . Diabetes Maternal Grandmother   . Hypertension Maternal Grandmother   . Heart disease Maternal Grandfather   . Diabetes Maternal Grandfather    . Hypertension Maternal Grandfather   . Hypertension Father    History  Substance Use Topics  . Smoking status: Never Smoker   . Smokeless tobacco: Never Used  . Alcohol Use: No   OB History    Gravida Para Term Preterm AB TAB SAB Ectopic Multiple Living   5 3 3  0 2 0 2 0 0 3     Review of Systems  As in history of present illness Allergies  Other; Latex; and Zofran  Home Medications   Prior to Admission medications   Medication Sig Start Date End Date Taking? Authorizing Provider  albuterol (PROVENTIL HFA;VENTOLIN HFA) 108 (90 BASE) MCG/ACT inhaler Inhale 2 puffs into the lungs every 6 (six) hours as needed for wheezing or shortness of breath.    Historical Provider, MD  furosemide (LASIX) 20 MG tablet Take 1 tablet (20 mg total) by mouth daily as needed (swelling). 12/24/14   Charm Rings, MD  griseofulvin (GRIFULVIN V) 500 MG tablet Take 1 tablet (500 mg total) by mouth daily. 12/24/14   Charm Rings, MD  meloxicam (MOBIC) 15 MG tablet Take 1 tablet (15 mg total) by mouth daily. 12/24/14   Charm Rings, MD  SUMAtriptan (IMITREX) 100 MG tablet Take one tablet (  100 mg) at onset of headache. May repeat in 2 hours if headache not relieved. No more than 2 doses in 24 hours. 10/08/14   Henrietta Hoover, NP   BP 121/76 mmHg  Pulse 65  Temp(Src) 99.4 F (37.4 C) (Oral)  Resp 16  SpO2 100%  LMP 12/03/2014  Breastfeeding? No Physical Exam  Constitutional: Laurie Reed is oriented to person, place, and time. Laurie Reed appears well-developed and well-nourished. No distress.  Cardiovascular: Normal rate.   Pulmonary/Chest: Effort normal.  Musculoskeletal: Laurie Reed exhibits edema (trace edema bilaterally).  Right knee: Mild swelling. No point tenderness. No joint laxity.  Neurological: Laurie Reed is alert and oriented to person, place, and time.  Skin:  Multiple tender pustules on posterior neck and scalp. Laurie Reed does have a posterior chain lymph node.    ED Course  Injection of joint Date/Time: 12/24/2014 7:16  PM Performed by: Charm Rings Authorized by: Charm Rings Consent: Verbal consent obtained. Risks and benefits: risks, benefits and alternatives were discussed Consent given by: patient Patient understanding: patient states understanding of the procedure being performed Patient identity confirmed: verbally with patient Time out: Immediately prior to procedure a "time out" was called to verify the correct patient, procedure, equipment, support staff and site/side marked as required. Comments: Skin was prepped with alcohol. Cold spray was used for an aesthetic. 4 mL of 2% lidocaine with 40 mg of Depo-Medrol was injected into the right knee. Patient tolerated procedure well with no immediate complication.   (including critical care time) Labs Review Labs Reviewed - No data to display  Imaging Review No results found.   MDM   1. Right knee pain   2. Tinea capitis   3. Patellar bursitis of right knee    Will treat scalp rash with griseofulvin for 6 weeks. Cortisone injection done today for knee pain. Meloxicam as needed. Recommended contacting sports medicine if Laurie Reed needs additional workup.    Charm Rings, MD 12/24/14 [phone]

## 2014-12-24 NOTE — ED Notes (Signed)
C/o "chronic" right leg/foot pain and swelling Also reports rash on back of scalp/head onset 2 weeks Alert... No acute distress.

## 2015-01-01 ENCOUNTER — Ambulatory Visit: Payer: Self-pay | Admitting: Sports Medicine

## 2015-01-03 NOTE — ED Notes (Signed)
Patient called w questions about her shampoo , discussed w Dr Piedad Climes

## 2015-01-07 ENCOUNTER — Ambulatory Visit (INDEPENDENT_AMBULATORY_CARE_PROVIDER_SITE_OTHER): Payer: Self-pay | Admitting: Family Medicine

## 2015-01-07 ENCOUNTER — Encounter: Payer: Self-pay | Admitting: Family Medicine

## 2015-01-07 VITALS — BP 113/69 | Ht 62.0 in | Wt 233.0 lb

## 2015-01-07 DIAGNOSIS — M24271 Disorder of ligament, right ankle: Secondary | ICD-10-CM

## 2015-01-07 DIAGNOSIS — M25571 Pain in right ankle and joints of right foot: Secondary | ICD-10-CM

## 2015-01-07 DIAGNOSIS — M25561 Pain in right knee: Secondary | ICD-10-CM

## 2015-01-07 DIAGNOSIS — M222X1 Patellofemoral disorders, right knee: Secondary | ICD-10-CM

## 2015-01-07 MED ORDER — MELOXICAM 15 MG PO TABS
15.0000 mg | ORAL_TABLET | Freq: Every day | ORAL | Status: DC
Start: 2015-01-07 — End: 2015-01-21

## 2015-01-07 NOTE — Progress Notes (Signed)
Laurie Reed - 29 y.o. female MRN [id number]  Date of birth: 1985/10/30  CC: No chief complaint on file.   SUBJECTIVE:   HPI  Right Knee pain:  - Duration: 2 years, worsening. 10/10 - Patient seen in urgent care and was given a cortisone injection & a prescription for mobic. The Mobic helps some although she reports it wears off after 6 or 7 hours    - her pain is 30% better following the cortisone injection.  - Knee burning with standing. 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 She does notice it while driving especially.   - Patient also notices it when walking. - At urgent care on 8/9 she asked to get an XR.  She has not done this yet, but plans to once she has her insurance.   Right ankle pain: - Since then occurring for several years.  - Pain is located on the lateral half of her ankle.  - She feels she may have twisted her ankle in the past but is unsure. - She tries to wear supportive shoe, but does not see a great benefit.  - She has not fallen. - Denies swelling, erythema, or warmth  ROS:     14 point RoS negative other than that listed in HPI.   HISTORY: Past Medical, Surgical, Social, and Family History Reviewed & Updated per EMR.  Pertinent Historical Findings include: negative  OBJECTIVE: BP 113/69 mmHg  Ht  (1.575 m)  Wt 233 lb (105.688 kg)  BMI 42.61 kg/m2  LMP 12/03/2014  Physical Exam  Gen: Calm, no distress Resp: non-labored breathing, speaking in full sentences.  Knee: right Normal to inspection with no erythema or effusion or obvious bony abnormalities. Palpation normal with no warmth or joint line tenderness or patellar tenderness or condyle tenderness. ROM normal in flexion and extension and lower leg rotation. Ligaments with solid consistent endpoints including ACL, PCL, LCL, MCL. Negative Mcmurray's and provocative meniscal tests. Painful patellar compression. + Grind test. Normal patellar  laxity Patellar and quadriceps tendons unremarkable. Hamstring and quadriceps strength is normal.  Ankle:right  No visible erythema or swelling. Range of motion is full in all directions. Strength is 4+/5 in all directions. Medial ligaments stable.  Patient does translate anteriorly significantly more than left. There is a click present with her drawer testing.  No sign of peroneal tendon subluxations Negative tarsal tunnel tinel's Able to walk 4 steps.  MEDICATIONS, LABS & OTHER ORDERS: Previous Medications   ALBUTEROL (PROVENTIL HFA;VENTOLIN HFA) 108 (90 BASE) MCG/ACT INHALER    Inhale 2 puffs into the lungs every 6 (six) hours as needed for wheezing or shortness of breath.   FUROSEMIDE (LASIX) 20 MG TABLET    Take 1 tablet (20 mg total) by mouth daily as needed (swelling).   GRISEOFULVIN (GRIFULVIN V) 500 MG TABLET    Take 1 tablet (500 mg total) by mouth daily.   MELOXICAM (MOBIC) 15 MG TABLET    Take 1 tablet (15 mg total) by mouth daily.   SUMATRIPTAN (IMITREX) 100 MG TABLET    Take one tablet (100 mg) at onset of headache. May repeat in 2 hours if headache not relieved. No more than 2 doses in 24 hours.   Modified Medications   No medications on file   New Prescriptions   No medications on file   Discontinued Medications   No medications on file  No orders of the  defined types were placed in this encounter.   ASSESSMENT & PLAN: See problem based charting & AVS for pt instructions.

## 2015-01-08 DIAGNOSIS — M25561 Pain in right knee: Secondary | ICD-10-CM | POA: Insufficient documentation

## 2015-01-08 DIAGNOSIS — M24271 Disorder of ligament, right ankle: Secondary | ICD-10-CM | POA: Insufficient documentation

## 2015-01-08 NOTE — Assessment & Plan Note (Signed)
29 yo who is on her feet most of the day.  Patient with PF pain based on exam today. Pain for many years in anterior portion of the knee. Had cortisone injection at urgent care 2 weeks ago, which provided some benefit.  Also provided a Rx for mobic which has helped som.   - Given refill on MOBIC - Encouraged to have XR done.  New order placed to ensure views are standing.  - Given handout on VMO strengthening exercises and instructed on them.  - f/u in 6 weeks.  Will refer to PT if she has insurance.  Counseled on the importance of getting insurance.

## 2015-01-08 NOTE — Assessment & Plan Note (Signed)
29 yo with chronic right ankle pain, especially over the last few months. Increased laxity with drawer test today.  Potentially has twisted her ankle in the past. Weak with all ankle maneuvers.  Good RoM and no swelling.   - Provided TheraBand and instructed on exercises ankle strengthening.   - XR (AP, Lat, mortise) to be done once she has insurance - counseled on the importance of supportive footwear.  - Consider PT once she has insurance. - f/u in 6 weeks.

## 2015-01-10 ENCOUNTER — Ambulatory Visit: Payer: Self-pay | Attending: Family Medicine

## 2015-01-21 ENCOUNTER — Encounter: Payer: Self-pay | Admitting: Family Medicine

## 2015-01-21 ENCOUNTER — Encounter (HOSPITAL_BASED_OUTPATIENT_CLINIC_OR_DEPARTMENT_OTHER): Payer: Self-pay | Admitting: Clinical

## 2015-01-21 ENCOUNTER — Ambulatory Visit: Payer: Self-pay | Attending: Family Medicine | Admitting: Family Medicine

## 2015-01-21 DIAGNOSIS — B350 Tinea barbae and tinea capitis: Secondary | ICD-10-CM | POA: Insufficient documentation

## 2015-01-21 DIAGNOSIS — F419 Anxiety disorder, unspecified: Secondary | ICD-10-CM

## 2015-01-21 DIAGNOSIS — M533 Sacrococcygeal disorders, not elsewhere classified: Secondary | ICD-10-CM | POA: Insufficient documentation

## 2015-01-21 DIAGNOSIS — F32A Depression, unspecified: Secondary | ICD-10-CM

## 2015-01-21 DIAGNOSIS — F418 Other specified anxiety disorders: Secondary | ICD-10-CM

## 2015-01-21 DIAGNOSIS — M545 Low back pain, unspecified: Secondary | ICD-10-CM

## 2015-01-21 DIAGNOSIS — R5383 Other fatigue: Secondary | ICD-10-CM

## 2015-01-21 DIAGNOSIS — E559 Vitamin D deficiency, unspecified: Secondary | ICD-10-CM

## 2015-01-21 DIAGNOSIS — F329 Major depressive disorder, single episode, unspecified: Secondary | ICD-10-CM

## 2015-01-21 DIAGNOSIS — M79671 Pain in right foot: Secondary | ICD-10-CM

## 2015-01-21 DIAGNOSIS — M79672 Pain in left foot: Secondary | ICD-10-CM

## 2015-01-21 LAB — TSH: TSH: 0.479 u[IU]/mL (ref 0.350–4.500)

## 2015-01-21 MED ORDER — BUPROPION HCL ER (XL) 150 MG PO TB24: 150.0000 mg | ORAL_TABLET | Freq: Every day | ORAL | Status: DC

## 2015-01-21 MED ORDER — CYCLOBENZAPRINE HCL 10 MG PO TABS: 10.0000 mg | ORAL_TABLET | Freq: Three times a day (TID) | ORAL | Status: DC | PRN

## 2015-01-21 MED ORDER — TERBINAFINE HCL 250 MG PO TABS: 250.0000 mg | ORAL_TABLET | Freq: Every day | ORAL | Status: DC

## 2015-01-21 MED ORDER — TRAMADOL HCL 50 MG PO TABS: 50.0000 mg | ORAL_TABLET | Freq: Every evening | ORAL | Status: DC | PRN
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
## 2015-01-21 NOTE — Patient Instructions (Signed)
Ms. Ericka Pontiff,  Diagnoses and all orders for this visit:  Severe obesity (BMI >= 40) -     Amb ref to Medical Nutrition Therapy-MNT  Foot pain, bilateral -     Vitamin D, 25-hydroxy -     Ambulatory referral to Podiatry -     traMADol (ULTRAM) 50 MG tablet; Take 1 tablet (50 mg total) by mouth at bedtime as needed.  Other fatigue -     TSH  Midline low back pain without sciatica -     cyclobenzaprine (FLEXERIL) 10 MG tablet; Take 1 tablet (10 mg total) by mouth 3 (three) times daily as needed for muscle spasms.  Tinea capitis -     terbinafine (LAMISIL) 250 MG tablet; Take 1 tablet (250 mg total) by mouth daily. For 6 weeks  Anxiety and depression -     buPROPion (WELLBUTRIN XL) 150 MG 24 hr tablet; Take 1 tablet (150 mg total) by mouth daily.   F/u in 4 weeks for depression and fatigue

## 2015-01-21 NOTE — Progress Notes (Signed)
Complaining of leg and feet pain, back pain  Stated still with  bump on head and neck  No energy, requesting wt management
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
## 2015-01-22 DIAGNOSIS — E559 Vitamin D deficiency, unspecified: Secondary | ICD-10-CM | POA: Insufficient documentation

## 2015-01-22 LAB — VITAMIN D 25 HYDROXY (VIT D DEFICIENCY, FRACTURES): Vit D, 25-Hydroxy: 11 ng/mL — ABNORMAL LOW (ref 30–100)

## 2015-01-22 MED ORDER — VITAMIN D (ERGOCALCIFEROL) 1.25 MG (50000 UNIT) PO CAPS: 50000.0000 [IU] | ORAL_CAPSULE | ORAL | Status: DC

## 2015-01-22 NOTE — Addendum Note (Signed)
Addended by: Dessa Phi on: 01/22/2015 09:01 AM   Modules accepted: Orders

## 2015-01-24 ENCOUNTER — Telehealth: Payer: Self-pay | Admitting: *Deleted

## 2015-01-24 NOTE — Telephone Encounter (Signed)
LVM to return call.

## 2015-01-24 NOTE — Telephone Encounter (Signed)
Patient returned phone call, please f/u °

## 2015-01-24 NOTE — Telephone Encounter (Signed)
-----   Message from Dessa Phi, MD sent at 01/22/2015  9:00 AM EDT ----- Vit D def, treating Normal TSH

## 2015-01-27 NOTE — Telephone Encounter (Signed)
Patient returned phone call, please f/u °

## 2015-01-27 NOTE — Telephone Encounter (Signed)
Date of birth verified by Pt Lab results given Advised to pick up Rx Vit D form [facility] pharmacy Take one pill once per week x 12 weeks Pt verbalized understanding

## 2015-02-04 ENCOUNTER — Other Ambulatory Visit: Payer: Self-pay | Admitting: Family Medicine

## 2015-02-04 ENCOUNTER — Ambulatory Visit
Admission: RE | Admit: 2015-02-04 | Discharge: 2015-02-04 | Disposition: A | Discharge status: Home / Self Care | Payer: No Typology Code available for payment source | Source: Ambulatory Visit | Attending: Family Medicine | Admitting: Family Medicine

## 2015-02-04 DIAGNOSIS — M25561 Pain in right knee: Secondary | ICD-10-CM

## 2015-02-04 DIAGNOSIS — M25571 Pain in right ankle and joints of right foot: Secondary | ICD-10-CM

## 2015-02-18 ENCOUNTER — Ambulatory Visit: Payer: Self-pay | Admitting: Family Medicine

## 2015-02-24 ENCOUNTER — Encounter (HOSPITAL_BASED_OUTPATIENT_CLINIC_OR_DEPARTMENT_OTHER): Payer: Self-pay | Admitting: Clinical

## 2015-02-24 DIAGNOSIS — F409 Phobic anxiety disorder, unspecified: Secondary | ICD-10-CM

## 2015-02-24 NOTE — Progress Notes (Signed)
ASSESSMENT: Pt currently experiencing symptoms of phobia. Pt needs to f/u with PCP and [facility] and would benefit from continued supportive counseling regarding coping with symptoms of phobia.  Stage of Change: contemplative  PLAN: 1. F/U with behavioral health consultant in as needed 2. Psychiatric Medications: Wellbutrin. 3. Behavioral recommendation(s):   -Consider self-care regarding sleep  SUBJECTIVE: Pt. referred by self for depression and anxiety:  Pt. 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 She is not sleeping, which is making her feel "jumpy" all the time, and she says she has always been afraid of animals; afraid she will lose her job over this fear. Duration of problem: one day Severity: severe  OBJECTIVE: Orientation & Cognition: Oriented x3. Thought processes normal and appropriate to situation. Mood: teary. Affect: appropriate Appearance: shaky Risk of harm to self or others: no risk of harm to self or others Substance use: none Assessments administered: none  Diagnosis: Phobia CPT Code: F40.9 -------------------------------------------- Other(s) present in the room: none  Time spent with patient in exam room: 45 minutes

## 2015-03-15 ENCOUNTER — Emergency Department (INDEPENDENT_AMBULATORY_CARE_PROVIDER_SITE_OTHER)
Admission: EM | Admit: 2015-03-15 | Discharge: 2015-03-15 | Disposition: A | Discharge status: Home / Self Care | Payer: Self-pay | Source: Home / Self Care | Attending: Family Medicine | Admitting: Family Medicine

## 2015-03-15 ENCOUNTER — Encounter (HOSPITAL_COMMUNITY): Payer: Self-pay | Admitting: *Deleted

## 2015-03-15 DIAGNOSIS — K529 Noninfective gastroenteritis and colitis, unspecified: Secondary | ICD-10-CM

## 2015-03-15 DIAGNOSIS — R69 Illness, unspecified: Secondary | ICD-10-CM

## 2015-03-15 DIAGNOSIS — J111 Influenza due to unidentified influenza virus with other respiratory manifestations: Secondary | ICD-10-CM

## 2015-03-15 LAB — POCT PREGNANCY, URINE: PREG TEST UR: NEGATIVE

## 2015-03-15 MED ORDER — PROMETHAZINE HCL 25 MG PO TABS: 25.0000 mg | ORAL_TABLET | Freq: Four times a day (QID) | ORAL | Status: DC | PRN

## 2015-03-15 MED ORDER — DIPHENOXYLATE-ATROPINE 2.5-0.025 MG PO TABS: 2.0000 | ORAL_TABLET | Freq: Four times a day (QID) | ORAL | Status: DC | PRN

## 2015-03-15 NOTE — ED Notes (Signed)
Congestion nausea   /  Vomiting        Symptoms  X  5  Days   - unsure  Of  Her last  menstural  Period                   child is  Ill  As  Well

## 2015-03-15 NOTE — ED Provider Notes (Signed)
CSN: [id number]     Arrival date & time 03/15/15  1810 History   First MD Initiated Contact with Patient 03/15/15 1916     Chief Complaint  Patient presents with  . Nasal Congestion   (Consider location/radiation/quality/duration/timing/severity/associated sxs/prior Treatment) Patient is a 29 y.o. female presenting with vomiting. The history is provided by the patient.  Emesis Severity:  Mild Duration:  1 day Quality:  Stomach contents Progression:  Unchanged Chronicity:  New Associated symptoms: cough, diarrhea and myalgias   Associated symptoms: no chills, no fever and no sore throat   Risk factors: sick contacts     Past Medical History  Diagnosis Date  . Migraine   . Reflux   . Environmental allergies   . Urinary tract infection   . Ovarian cyst   . Chlamydia   . Miscarriage   . Migraines   . Anxiety 2008  . Depression 2008    no meds, currenly ok  . Acid reflux 2004  . Ulcer of the stomach and intestine 2004  . Asthma 1993    no previous intubations or hospitalizations    Past Surgical History  Procedure Laterality Date  . Wisdom tooth extraction  2007  . Colonoscopy  2004   . Multiple tooth extractions  2015     3 teeth removed    Family History  Problem Relation Age of Onset  . Hypotension Neg Hx   . Anesthesia problems Neg Hx   . Malignant hyperthermia Neg Hx   . Pseudochol deficiency Neg Hx   . Cancer Neg Hx   . Asthma Mother   . Heart disease Mother   . Diabetes Mother   . Hypertension Mother   . Asthma Maternal Aunt   . Hypertension Maternal Aunt   . Diabetes Maternal Aunt   . Asthma Maternal Uncle   . Hypertension Maternal Uncle   . Diabetes Maternal Uncle   . Heart disease Maternal Grandmother   . Diabetes Maternal Grandmother   . Hypertension Maternal Grandmother   . Heart disease Maternal Grandfather   . Diabetes Maternal Grandfather   . Hypertension Maternal Grandfather   . Hypertension Father    Social History  Substance Use  Topics  . Smoking status: Never Smoker   . Smokeless tobacco: Never Used  . Alcohol Use: No   OB History    Gravida Para Term Preterm AB TAB SAB Ectopic Multiple Living   0 2 0 2 0 0 3     Review of Systems  Constitutional: Negative for fever and chills.  HENT: Positive for congestion. Negative for sore throat.   Respiratory: Negative.   Cardiovascular: Negative.   Gastrointestinal: Positive for nausea, vomiting and diarrhea. Negative for blood in stool.  Genitourinary: Negative.   Musculoskeletal: Positive for myalgias.  All other systems reviewed and are negative.   Allergies  Other; Latex; and Zofran  Home Medications   Prior to Admission medications   Medication Sig Start Date End Date Taking? Authorizing Provider  albuterol (PROVENTIL HFA;VENTOLIN HFA) 108 (90 BASE) MCG/ACT inhaler Inhale 2 puffs into the lungs every 6 (six) hours as needed for wheezing or shortness of breath.    Historical Provider, MD  buPROPion (WELLBUTRIN XL) 150 MG 24 hr tablet Take 1 tablet (150 mg total) by mouth daily. 01/21/15   Josalyn Funches, MD  cyclobenzaprine (FLEXERIL) 10 MG tablet Take 1 tablet (10 mg total) by mouth 3 (three) times daily as needed for muscle spasms. 01/21/15  Dessa PhiJosalyn Funches, MD  diphenoxylate-atropine (LOMOTIL) 2.5-0.025 MG tablet Take 2 tablets by mouth 4 (four) times daily as needed for diarrhea or loose stools. 03/15/15   Linna HoffJames D Emran Molzahn, MD  meloxicam (MOBIC) 15 MG tablet Take 1 tablet (15 mg total) by mouth daily. 12/24/14   Charm RingsErin J Honig, MD  promethazine (PHENERGAN) 25 MG tablet Take 1 tablet (25 mg total) by mouth every 6 (six) hours as needed for nausea or vomiting. 03/15/15   Linna HoffJames D Zayn Selley, MD  terbinafine (LAMISIL) 250 MG tablet Take 1 tablet (250 mg total) by mouth daily. For 6 weeks 01/21/15   Dessa PhiJosalyn Funches, MD  traMADol (ULTRAM) 50 MG tablet Take 1 tablet (50 mg total) by mouth at bedtime as needed. 01/21/15   Dessa PhiJosalyn Funches, MD  Vitamin D, Ergocalciferol,  (DRISDOL) 50000 UNITS CAPS capsule Take 1 capsule (50,000 Units total) by mouth every 7 (seven) days. For 12 weeks 01/22/15   Dessa PhiJosalyn Funches, MD   Meds Ordered and Administered this Visit  Medications - No data to display  BP 116/73 mmHg  Pulse 102  Temp(Src) 98.3 F (36.8 C) (Oral)  Resp 18  SpO2 99% No data found.   Physical Exam  Constitutional: She is oriented to person, place, and time. She appears well-developed and well-nourished. No distress.  HENT:  Head: Normocephalic.  Right Ear: External ear normal.  Left Ear: External ear normal.  Mouth/Throat: Oropharynx is clear and moist.  Eyes: Pupils are equal, round, and reactive to light.  Neck: Normal range of motion. Neck supple.  Cardiovascular: Regular rhythm, normal heart sounds and intact distal pulses.   Pulmonary/Chest: Effort normal and breath sounds normal.  Abdominal: Soft. Bowel sounds are normal. She exhibits no distension and no mass. There is no tenderness. There is no rebound and no guarding.  Lymphadenopathy:    She has no cervical adenopathy.  Neurological: She is alert and oriented to person, place, and time.  Skin: Skin is warm and dry.  Nursing note and vitals reviewed.   ED Course  Procedures (including critical care time)  Labs Review Labs Reviewed  POCT PREGNANCY, URINE    Imaging Review No results found.   Visual Acuity Review  Right Eye Distance:   Left Eye Distance:   Bilateral Distance:    Right Eye Near:   Left Eye Near:    Bilateral Near:         MDM   1. Gastroenteritis, acute   2. Influenza-like illness       Linna HoffJames D Rasa Degrazia, MD 03/15/15 [phone]

## 2015-03-27 ENCOUNTER — Ambulatory Visit: Payer: Self-pay | Admitting: Dietician

## 2015-06-09 ENCOUNTER — Emergency Department (HOSPITAL_COMMUNITY)
Admission: EM | Admit: 2015-06-09 | Discharge: 2015-06-09 | Disposition: A | Discharge status: Home / Self Care | Payer: Self-pay | Attending: Emergency Medicine | Admitting: Emergency Medicine

## 2015-06-09 ENCOUNTER — Encounter (HOSPITAL_COMMUNITY): Payer: Self-pay | Admitting: Family Medicine

## 2015-06-09 DIAGNOSIS — M79642 Pain in left hand: Secondary | ICD-10-CM | POA: Insufficient documentation

## 2015-06-09 DIAGNOSIS — G43909 Migraine, unspecified, not intractable, without status migrainosus: Secondary | ICD-10-CM | POA: Insufficient documentation

## 2015-06-09 DIAGNOSIS — Z791 Long term (current) use of non-steroidal anti-inflammatories (NSAID): Secondary | ICD-10-CM | POA: Insufficient documentation

## 2015-06-09 DIAGNOSIS — Z87440 Personal history of urinary (tract) infections: Secondary | ICD-10-CM | POA: Insufficient documentation

## 2015-06-09 DIAGNOSIS — Z91040 Latex allergy status: Secondary | ICD-10-CM | POA: Insufficient documentation

## 2015-06-09 DIAGNOSIS — R630 Anorexia: Secondary | ICD-10-CM | POA: Insufficient documentation

## 2015-06-09 DIAGNOSIS — Z3202 Encounter for pregnancy test, result negative: Secondary | ICD-10-CM | POA: Insufficient documentation

## 2015-06-09 DIAGNOSIS — J45909 Unspecified asthma, uncomplicated: Secondary | ICD-10-CM | POA: Insufficient documentation

## 2015-06-09 DIAGNOSIS — F419 Anxiety disorder, unspecified: Secondary | ICD-10-CM | POA: Insufficient documentation

## 2015-06-09 DIAGNOSIS — Z79899 Other long term (current) drug therapy: Secondary | ICD-10-CM | POA: Insufficient documentation

## 2015-06-09 DIAGNOSIS — R200 Anesthesia of skin: Secondary | ICD-10-CM | POA: Insufficient documentation

## 2015-06-09 DIAGNOSIS — Z8742 Personal history of other diseases of the female genital tract: Secondary | ICD-10-CM | POA: Insufficient documentation

## 2015-06-09 DIAGNOSIS — R197 Diarrhea, unspecified: Secondary | ICD-10-CM

## 2015-06-09 DIAGNOSIS — Z8719 Personal history of other diseases of the digestive system: Secondary | ICD-10-CM | POA: Insufficient documentation

## 2015-06-09 DIAGNOSIS — Z8619 Personal history of other infectious and parasitic diseases: Secondary | ICD-10-CM | POA: Insufficient documentation

## 2015-06-09 DIAGNOSIS — B349 Viral infection, unspecified: Secondary | ICD-10-CM | POA: Insufficient documentation

## 2015-06-09 DIAGNOSIS — R112 Nausea with vomiting, unspecified: Secondary | ICD-10-CM

## 2015-06-09 DIAGNOSIS — F329 Major depressive disorder, single episode, unspecified: Secondary | ICD-10-CM | POA: Insufficient documentation

## 2015-06-09 LAB — CBC
HEMATOCRIT: 35.3 % — AB (ref 36.0–46.0)
Hemoglobin: 11.4 g/dL — ABNORMAL LOW (ref 12.0–15.0)
MCH: 26.3 pg (ref 26.0–34.0)
MCHC: 32.3 g/dL (ref 30.0–36.0)
MCV: 81.5 fL (ref 78.0–100.0)
PLATELETS: 315 10*3/uL (ref 150–400)
RBC: 4.33 MIL/uL (ref 3.87–5.11)
RDW: 13.6 % (ref 11.5–15.5)
WBC: 7.5 10*3/uL (ref 4.0–10.5)

## 2015-06-09 LAB — URINALYSIS, ROUTINE W REFLEX MICROSCOPIC
BILIRUBIN URINE: NEGATIVE
GLUCOSE, UA: NEGATIVE mg/dL
KETONES UR: NEGATIVE mg/dL
LEUKOCYTES UA: NEGATIVE
Nitrite: NEGATIVE
PH: 6.5 (ref 5.0–8.0)
PROTEIN: NEGATIVE mg/dL
Specific Gravity, Urine: 1.024 (ref 1.005–1.030)

## 2015-06-09 LAB — COMPREHENSIVE METABOLIC PANEL
ALBUMIN: 3 g/dL — AB (ref 3.5–5.0)
ALT: 12 U/L — AB (ref 14–54)
AST: 14 U/L — AB (ref 15–41)
Alkaline Phosphatase: 55 U/L (ref 38–126)
Anion gap: 9 (ref 5–15)
BUN: 6 mg/dL (ref 6–20)
CHLORIDE: 105 mmol/L (ref 101–111)
CO2: 25 mmol/L (ref 22–32)
CREATININE: 0.8 mg/dL (ref 0.44–1.00)
Calcium: 8.7 mg/dL — ABNORMAL LOW (ref 8.9–10.3)
GFR calc Af Amer: >60 mL/min (ref >60)
GLUCOSE: 84 mg/dL (ref 65–99)
Potassium: 3.9 mmol/L (ref 3.5–5.1)
Sodium: 139 mmol/L (ref 135–145)
Total Bilirubin: 0.4 mg/dL (ref 0.3–1.2)
Total Protein: 6.4 g/dL — ABNORMAL LOW (ref 6.5–8.1)

## 2015-06-09 LAB — URINE MICROSCOPIC-ADD ON: Bacteria, UA: NONE SEEN

## 2015-06-09 LAB — LIPASE, BLOOD: LIPASE: 23 U/L (ref 11–51)

## 2015-06-09 LAB — HCG, QUANTITATIVE, PREGNANCY: hCG, Beta Chain, Quant, S: <1 m[IU]/mL (ref <5)

## 2015-06-09 MED ORDER — PROMETHAZINE HCL 25 MG PO TABS
12.5000 mg | ORAL_TABLET | Freq: Once | ORAL | Status: AC
  Administered 2015-06-09: 12.5 mg via ORAL
  Filled 2015-06-09: qty 1

## 2015-06-09 MED ORDER — PROMETHAZINE HCL 25 MG PO TABS: 25.0000 mg | ORAL_TABLET | Freq: Four times a day (QID) | ORAL | Status: DC | PRN

## 2015-06-09 MED ORDER — NAPROXEN 500 MG PO TABS: 500.0000 mg | ORAL_TABLET | Freq: Two times a day (BID) | ORAL | Status: DC

## 2015-06-09 NOTE — Discharge Instructions (Signed)
Diarrhea Diarrhea is frequent loose and watery bowel movements. It can cause you to feel weak and dehydrated. Dehydration can cause you to become tired and thirsty, have a dry mouth, and have decreased urination that often is dark yellow. Diarrhea is a sign of another problem, most often an infection that will not last long. In most cases, diarrhea typically lasts 2-3 days. However, it can last longer if it is a sign of something more serious. It is important to treat your diarrhea as directed by your caregiver to lessen or prevent future episodes of diarrhea. CAUSES  Some common causes include:  Gastrointestinal infections caused by viruses, bacteria, or parasites.  Food poisoning or food allergies.  Certain medicines, such as antibiotics, chemotherapy, and laxatives.  Artificial sweeteners and fructose.  Digestive disorders. HOME CARE INSTRUCTIONS  Ensure adequate fluid intake (hydration): Have 1 cup (8 oz) of fluid for each diarrhea episode. Avoid fluids that contain simple sugars or sports drinks, fruit juices, whole milk products, and sodas. Your urine should be clear or pale yellow if you are drinking enough fluids. Hydrate with an oral rehydration solution that you can purchase at pharmacies, retail stores, and online. You can prepare an oral rehydration solution at home by mixing the following ingredients together:   - tsp table salt.   tsp baking soda.   tsp salt substitute containing potassium chloride.  1  tablespoons sugar.  1 L (34 oz) of water.  Certain foods and beverages may increase the speed at which food moves through the gastrointestinal (GI) tract. These foods and beverages should be avoided and include:  Caffeinated and alcoholic beverages.  High-fiber foods, such as raw fruits and vegetables, nuts, seeds, and whole grain breads and cereals.  Foods and beverages sweetened with sugar alcohols, such as xylitol, sorbitol, and mannitol.  Some foods may be well  tolerated and may help thicken stool including:  Starchy foods, such as rice, toast, pasta, low-sugar cereal, oatmeal, grits, baked potatoes, crackers, and bagels.  Bananas.  Applesauce.  Add probiotic-rich foods to help increase healthy bacteria in the GI tract, such as yogurt and fermented milk products.  Wash your hands well after each diarrhea episode.  Only take over-the-counter or prescription medicines as directed by your caregiver.  Take a warm bath to relieve any burning or pain from frequent diarrhea episodes. SEEK IMMEDIATE MEDICAL CARE IF:   You are unable to keep fluids down.  You have persistent vomiting.  You have blood in your stool, or your stools are black and tarry.  You do not urinate in 6-8 hours, or there is only a small amount of very dark urine.  You have abdominal pain that increases or localizes.  You have weakness, dizziness, confusion, or light-headedness.  You have a severe headache.  Your diarrhea gets worse or does not get better.  You have a fever or persistent symptoms for more than 2-3 days.  You have a fever and your symptoms suddenly get worse. MAKE SURE YOU:   Understand these instructions.  Will watch your condition.  Will get help right away if you are not doing well or get worse.   This information is not intended to replace advice given to you by your health care provider. Make sure you discuss any questions you have with your health care provider.   Document Released: 04/23/2002 Document Revised: 05/24/2014 Document Reviewed: 01/09/2012 Elsevier Interactive Patient Education 2016 Else[employer] Inc[employer]  Nausea and Vomiting Nausea means you feel sick to your stomach.  Throwing up (vomiting) is a reflex where stomach contents come out of your mouth. HOME CARE   Take medicine as told by your doctor.  Do not force yourself to eat. However, you do need to drink fluids.  If you feel like eating, eat a normal diet as told by your  doctor.  Eat rice, wheat, potatoes, bread, lean meats, yogurt, fruits, and vegetables.  Avoid high-fat foods.  Drink enough fluids to keep your pee (urine) clear or pale yellow.  Ask your doctor how to replace body fluid losses (rehydrate). Signs of body fluid loss (dehydration) include:  Feeling very thirsty.  Dry lips and mouth.  Feeling dizzy.  Dark pee.  Peeing less than normal.  Feeling confused.  Fast breathing or heart rate. GET HELP RIGHT AWAY IF:   You have blood in your throw up.  You have black or bloody poop (stool).  You have a bad headache or stiff neck.  You feel confused.  You have bad belly (abdominal) pain.  You have chest pain or trouble breathing.  You do not pee at least once every 8 hours.  You have cold, clammy skin.  You keep throwing up after 24 to 48 hours.  You have a fever. MAKE SURE YOU:   Understand these instructions.  Will watch your condition.  Will get help right away if you are not doing well or get worse.   This information is not intended to replace advice given to you by your health care provider. Make sure you discuss any questions you have with your health care provider.   Document Released: 10/20/2007 Document Revised: 07/26/2011 Document Reviewed: 10/02/2010 Elsevier Interactive Patient Education [employer]

## 2015-06-09 NOTE — ED Provider Notes (Signed)
CSN: [id number]     Arrival date & time 06/09/15  1110 History  By signing my name below, I, Emmanuella Mensah, attest that this documentation has been prepared under the direction and in the presence of Cheri Fowler, PA-C. Electronically Signed: Angelene Giovanni, ED Scribe. 06/09/2015. 3:01 PM.      Chief Complaint  Patient presents with  . Diarrhea  . Hand Pain   The history is provided by the patient. No language interpreter was used.   HPI Comments: Laurie Reed is a 30 y.o. female who presents to the Emergency Department complaining of 7 episodes of nonbloody watery diarrhea onset 2 days ago. She reports associated nausea and one episode of NBNB vomiting. She states that she has been able to tolerate PO intake, it is just decreased. She states that her children are her sick contacts with symptoms of stomach virus. No alleviating factors noted. Pt has not taken any medications for her symptoms. She denies any changes in medication, recent travel out of the country, or recent antibiotic use. She denies any fever, chills, dizziness, abdominal pain, CP, or SOB.  She also c/o gradually worsening intermittent left hand pain that radiates up her last 3 fingers onset 3 days ago. She reports associated numbness/tingling in the last 3 fingers causing her to drop things. She states that her pain is worse with movement. She denies any hx of thyroid issues or DM. She denies any recent falls or trauma. No alleviating factors noted. Pt is right hand dominate. No meds tried PTA.   Past Medical History  Diagnosis Date  . Migraine   . Reflux   . Environmental allergies   . Urinary tract infection   . Ovarian cyst   . Chlamydia   . Miscarriage   . Migraines   . Anxiety 2008  . Depression 2008    no meds, currenly ok  . Acid reflux 2004  . Ulcer of the stomach and intestine 2004  . Asthma 1993    no previous intubations or hospitalizations    Past Surgical History  Procedure Laterality Date   . Wisdom tooth extraction  2007  . Colonoscopy  2004   . Multiple tooth extractions  2015     3 teeth removed    Family History  Problem Relation Age of Onset  . Hypotension Neg Hx   . Anesthesia problems Neg Hx   . Malignant hyperthermia Neg Hx   . Pseudochol deficiency Neg Hx   . Cancer Neg Hx   . Asthma Mother   . Heart disease Mother   . Diabetes Mother   . Hypertension Mother   . Asthma Maternal Aunt   . Hypertension Maternal Aunt   . Diabetes Maternal Aunt   . Asthma Maternal Uncle   . Hypertension Maternal Uncle   . Diabetes Maternal Uncle   . Heart disease Maternal Grandmother   . Diabetes Maternal Grandmother   . Hypertension Maternal Grandmother   . Heart disease Maternal Grandfather   . Diabetes Maternal Grandfather   . Hypertension Maternal Grandfather   . Hypertension Father    Social History  Substance Use Topics  . Smoking status: Never Smoker   . Smokeless tobacco: Never Used  . Alcohol Use: No   OB History    Gravida Para Term Preterm AB TAB SAB Ectopic Multiple Living   0 2 0 2 0 0 3     Review of Systems  Constitutional: Negative for fever and chills.  Gastrointestinal: Positive for nausea, vomiting and diarrhea. Negative for abdominal pain.  Musculoskeletal: Positive for arthralgias (left hand).  Neurological: Positive for numbness. Negative for dizziness.  All other systems reviewed and are negative.     Allergies  Other; Latex; and Zofran  Home Medications   Prior to Admission medications   Medication Sig Start Date End Date Taking? Authorizing Provider  acetaminophen (TYLENOL) 325 MG tablet Take 650 mg by mouth every 6 (six) hours as needed for mild pain.   Yes Historical Provider, MD  albuterol (PROVENTIL HFA;VENTOLIN HFA) 108 (90 BASE) MCG/ACT inhaler Inhale 2 puffs into the lungs every 6 (six) hours as needed for wheezing or shortness of breath.   Yes Historical Provider, MD  buPROPion (WELLBUTRIN XL) 150 MG 24 hr tablet  Take 1 tablet (150 mg total) by mouth daily. 01/21/15  Yes Josalyn Funches, MD  cyclobenzaprine (FLEXERIL) 10 MG tablet Take 1 tablet (10 mg total) by mouth 3 (three) times daily as needed for muscle spasms. 01/21/15  Yes Josalyn Funches, MD  ibuprofen (ADVIL,MOTRIN) 200 MG tablet Take 200 mg by mouth every 6 (six) hours as needed for moderate pain.   Yes Historical Provider, MD  meloxicam (MOBIC) 15 MG tablet Take 1 tablet (15 mg total) by mouth daily. 12/24/14  Yes Charm Rings, MD  traMADol (ULTRAM) 50 MG tablet Take 1 tablet (50 mg total) by mouth at bedtime as needed. Patient taking differently: Take 50 mg by mouth at bedtime as needed for moderate pain.  01/21/15  Yes Josalyn Funches, MD  Vitamin D, Ergocalciferol, (DRISDOL) 50000 UNITS CAPS capsule Take 1 capsule (50,000 Units total) by mouth every 7 (seven) days. For 12 weeks 01/22/15  Yes Josalyn Funches, MD  diphenoxylate-atropine (LOMOTIL) 2.5-0.025 MG tablet Take 2 tablets by mouth 4 (four) times daily as needed for diarrhea or loose stools. Patient not taking: Reported on 06/09/2015 03/15/15   Linna Hoff, MD  naproxen (NAPROSYN) 500 MG tablet Take 1 tablet (500 mg total) by mouth 2 (two) times daily. 06/09/15   Cheri Fowler, PA-C  promethazine (PHENERGAN) 25 MG tablet Take 1 tablet (25 mg total) by mouth every 6 (six) hours as needed for nausea or vomiting. 06/09/15   Cheri Fowler, PA-C  terbinafine (LAMISIL) 250 MG tablet Take 1 tablet (250 mg total) by mouth daily. For 6 weeks Patient not taking: Reported on 06/09/2015 01/21/15   Josalyn Funches, MD   BP 109/64 mmHg  Pulse 87  Temp(Src) 98.2 F (36.8 C) (Oral)  Resp 18  SpO2 100%  LMP 05/14/2015 Physical Exam  Constitutional: She is oriented to person, place, and time. She appears well-developed and well-nourished.  Non-toxic appearance. She does not have a sickly appearance. She does not appear ill.  HENT:  Head: Normocephalic and atraumatic.  Mouth/Throat: Oropharynx is clear and moist.   Eyes: Conjunctivae are normal. Pupils are equal, round, and reactive to light.  Neck: Normal range of motion. Neck supple.  Cardiovascular: Normal rate, regular rhythm and normal heart sounds.   No murmur heard. Capillary refill less than 3 seconds in left digits.   Pulmonary/Chest: Effort normal and breath sounds normal. No accessory muscle usage or stridor. No respiratory distress. She has no wheezes. She has no rhonchi. She has no rales.  Abdominal: Soft. Bowel sounds are normal. She exhibits no distension. There is no tenderness.  Musculoskeletal: Normal range of motion.  Left hand moderately TTP along dorsal aspect of wrist.  No swelling, erythema, or bruising.  Lymphadenopathy:    She has no cervical adenopathy.  Neurological: She is alert and oriented to person, place, and time.  Speech clear without dysarthria.  Strength and sensation intact bilaterally throughout upper extremities.  5/5 grip strength bilaterally.  Skin: Skin is warm and dry.  Psychiatric: She has a normal mood and affect. Her behavior is normal.    ED Course  Procedures (including critical care time) DIAGNOSTIC STUDIES: Oxygen Saturation is 100% on RA, normal by my interpretation.    COORDINATION OF CARE: 2:59 PM- Pt advised of plan for treatment and pt agrees. Pt informed of lab results. She will receive Naprosyn. Advised to follow up with PCP.    Labs Review Labs Reviewed  COMPREHENSIVE METABOLIC PANEL - Abnormal; Notable for the following:    Calcium 8.7 (*)    Total Protein 6.4 (*)    Albumin 3.0 (*)    AST 14 (*)    ALT 12 (*)    All other components within normal limits  CBC - Abnormal; Notable for the following:    Hemoglobin 11.4 (*)    HCT 35.3 (*)    All other components within normal limits  URINALYSIS, ROUTINE W REFLEX MICROSCOPIC (NOT AT [facility]) - Abnormal; Notable for the following:    APPearance CLOUDY (*)    Hgb urine dipstick TRACE (*)    All other components within normal limits   URINE MICROSCOPIC-ADD ON - Abnormal; Notable for the following:    Squamous Epithelial / LPF 6-30 (*)    All other components within normal limits  LIPASE, BLOOD  HCG, QUANTITATIVE, PREGNANCY    Cheri Fowler, PA-C has personally reviewed and evaluated these images and lab results as part of her medical decision-making.   MDM   Final diagnoses:  Viral syndrome  Non-intractable vomiting with nausea, vomiting of unspecified type  Diarrhea, unspecified type    Patient presents with symptoms consistent with viral illness.  Able to tolerate PO intake.  VSS, NAD.  On exam, heart RRR, lungs CTAB, abdomen soft and benign.  Strength and sensation intact bilaterally throughout upper extremities.  Labs unremarkable.  No indication for IVF at this time.  Will give phenergan for nausea.  Discussed return precautions.  Will d/c home with naprosyn for pain and phenergan for nausea/vomiting.  Follow up with [facility].  Patient agrees and acknowledges the above plan for discharge.  I personally performed the services described in this documentation, which was scribed in my presence. The recorded information has been reviewed and is accurate.   Cheri Fowler, PA-C 06/09/15 1555  Margarita Grizzle, MD 06/11/15 1254

## 2015-06-09 NOTE — ED Notes (Signed)
Pt here with hand pain and diarrhea. sts that her kids have had a virus. sts she vomited 1x.

## 2015-06-10 ENCOUNTER — Encounter (HOSPITAL_COMMUNITY): Payer: Self-pay | Admitting: *Deleted

## 2015-06-10 ENCOUNTER — Emergency Department (INDEPENDENT_AMBULATORY_CARE_PROVIDER_SITE_OTHER)
Admission: EM | Admit: 2015-06-10 | Discharge: 2015-06-10 | Disposition: A | Discharge status: Home / Self Care | Payer: Self-pay | Source: Home / Self Care | Attending: Emergency Medicine | Admitting: Emergency Medicine

## 2015-06-10 DIAGNOSIS — M79671 Pain in right foot: Secondary | ICD-10-CM

## 2015-06-10 DIAGNOSIS — M778 Other enthesopathies, not elsewhere classified: Secondary | ICD-10-CM

## 2015-06-10 DIAGNOSIS — H6121 Impacted cerumen, right ear: Secondary | ICD-10-CM

## 2015-06-10 DIAGNOSIS — M79672 Pain in left foot: Secondary | ICD-10-CM

## 2015-06-10 MED ORDER — TRAMADOL HCL 50 MG PO TABS: 50.0000 mg | ORAL_TABLET | Freq: Every evening | ORAL | Status: DC | PRN

## 2015-06-10 MED ORDER — PREDNISONE 50 MG PO TABS: ORAL_TABLET | ORAL | Status: DC

## 2015-06-10 MED ORDER — IBUPROFEN 600 MG PO TABS: 600.0000 mg | ORAL_TABLET | Freq: Four times a day (QID) | ORAL | Status: DC | PRN

## 2015-06-10 NOTE — ED Notes (Signed)
Pt  Reports  Pain r  Hand     And   Wrist     X  4  Days  denys  specefic  Injury but  Uses  Hand a  Lot at home /  Work      Pt  Also  Reports  r  Earache

## 2015-06-10 NOTE — ED Provider Notes (Signed)
CSN: [id number]     Arrival date & time 06/10/15  1440 History   First MD Initiated Contact with Patient 06/10/15 1601     Chief Complaint  Patient presents with  . Otalgia   (Consider location/radiation/quality/duration/timing/severity/associated sxs/prior Treatment) HPI She is a 30 year old woman here for evaluation of left wrist pain. She has had a long history of pain in both of her wrists and hands, but worse in the left. This has gotten worse over the last days to weeks. She reports pain primarily in the dorsal and ulnar wrist. It is worse with movement. The pain radiates into her third fourth and fifth digits. She does also report some palmar tingling into the third fourth and fifth digits. 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 She states she has been dropping things more often. She also reports cramping in her hands with activity.  She also reports 2 days of decreased hearing in the right ear. She has a history of ear infections as a child for which she had tubes placed.  Past Medical History  Diagnosis Date  . Migraine   . Reflux   . Environmental allergies   . Urinary tract infection   . Ovarian cyst   . Chlamydia   . Miscarriage   . Migraines   . Anxiety 2008  . Depression 2008    no meds, currenly ok  . Acid reflux 2004  . Ulcer of the stomach and intestine 2004  . Asthma 1993    no previous intubations or hospitalizations    Past Surgical History  Procedure Laterality Date  . Wisdom tooth extraction  2007  . Colonoscopy  2004   . Multiple tooth extractions  2015     3 teeth removed    Family History  Problem Relation Age of Onset  . Hypotension Neg Hx   . Anesthesia problems Neg Hx   . Malignant hyperthermia Neg Hx   . Pseudochol deficiency Neg Hx   . Cancer Neg Hx   . Asthma Mother   . Heart disease Mother   . Diabetes Mother   . Hypertension Mother   . Asthma Maternal Aunt   . Hypertension Maternal Aunt   . Diabetes Maternal Aunt   .  Asthma Maternal Uncle   . Hypertension Maternal Uncle   . Diabetes Maternal Uncle   . Heart disease Maternal Grandmother   . Diabetes Maternal Grandmother   . Hypertension Maternal Grandmother   . Heart disease Maternal Grandfather   . Diabetes Maternal Grandfather   . Hypertension Maternal Grandfather   . Hypertension Father    Social History  Substance Use Topics  . Smoking status: Never Smoker   . Smokeless tobacco: Never Used  . Alcohol Use: No   OB History    Gravida Para Term Preterm AB TAB SAB Ectopic Multiple Living   0 2 0 2 0 0 3     Review of Systems As in history of present illness Allergies  Other; Latex; and Zofran  Home Medications   Prior to Admission medications   Medication Sig Start Date End Date Taking? Authorizing Provider  acetaminophen (TYLENOL) 325 MG tablet Take 650 mg by mouth every 6 (six) hours as needed for mild pain.    Historical Provider, MD  albuterol (PROVENTIL HFA;VENTOLIN HFA) 108 (90 BASE) MCG/ACT inhaler Inhale 2 puffs into the lungs every 6 (six) hours as needed for wheezing or shortness of breath.  Historical Provider, MD  buPROPion (WELLBUTRIN XL) 150 MG 24 hr tablet Take 1 tablet (150 mg total) by mouth daily. 01/21/15   Josalyn Funches, MD  cyclobenzaprine (FLEXERIL) 10 MG tablet Take 1 tablet (10 mg total) by mouth 3 (three) times daily as needed for muscle spasms. 01/21/15   Josalyn Funches, MD  diphenoxylate-atropine (LOMOTIL) 2.5-0.025 MG tablet Take 2 tablets by mouth 4 (four) times daily as needed for diarrhea or loose stools. Patient not taking: Reported on 06/09/2015 03/15/15   Linna Hoff, MD  ibuprofen (ADVIL,MOTRIN) 600 MG tablet Take 1 tablet (600 mg total) by mouth every 6 (six) hours as needed for moderate pain. 06/10/15   Charm Rings, MD  meloxicam (MOBIC) 15 MG tablet Take 1 tablet (15 mg total) by mouth daily. 12/24/14   Charm Rings, MD  naproxen (NAPROSYN) 500 MG tablet Take 1 tablet (500 mg total) by mouth 2  (two) times daily. 06/09/15   Cheri Fowler, PA-C  predniSONE (DELTASONE) 50 MG tablet Take 1 pill daily for 5 days. 06/10/15   Charm Rings, MD  promethazine (PHENERGAN) 25 MG tablet Take 1 tablet (25 mg total) by mouth every 6 (six) hours as needed for nausea or vomiting. 06/09/15   Cheri Fowler, PA-C  traMADol (ULTRAM) 50 MG tablet Take 1 tablet (50 mg total) by mouth at bedtime as needed. 06/10/15   Charm Rings, MD  Vitamin D, Ergocalciferol, (DRISDOL) 50000 UNITS CAPS capsule Take 1 capsule (50,000 Units total) by mouth every 7 (seven) days. For 12 weeks 01/22/15   Dessa Phi, MD   Meds Ordered and Administered this Visit  Medications - No data to display  BP 120/78 mmHg  Pulse 79  Temp(Src) 98.8 F (37.1 C) (Oral)  Resp 18  SpO2 100%  LMP 05/17/2015 No data found.   Physical Exam  Constitutional: She is oriented to person, place, and time. She appears well-developed and well-nourished. No distress.  HENT:  Right ear canal obscured by cerumen.  Cardiovascular: Normal rate.   Pulmonary/Chest: Effort normal.  Musculoskeletal:  Left wrist: No erythema or edema. Range of motion is limited due to pain, both in flexion and extension as well as pronation and supination. She is tender to palpation across the dorsal and ulnar wrist. Negative Tinel's, but positive Phalen's. 2+ radial pulse. The left fingers are somewhat cool compared to the right, but has brisk cap refill.  Neurological: She is alert and oriented to person, place, and time.    ED Course  Procedures (including critical care time)  Labs Review Labs Reviewed - No data to display  Imaging Review No results found.    MDM   1. Cerumen impaction, right   2. Left wrist tendonitis   3. Foot pain, bilateral    Right ear canal and TM are normal after washout. We'll treat tendinitis with bracing, prednisone, ibuprofen, and tramadol as needed for pain. I am concerned about carpal tunnel as well given the tingling and  dropping things. I would like to have her follow up with a specialist, but she does not have any insurance. Follow-up as needed.    Charm Rings, MD 06/10/15 [phone]

## 2015-06-10 NOTE — Discharge Instructions (Signed)
We washed your ear out today. You have tendinitis in your left wrist. I'm also worried you may have carpal tunnel. Wear the wrist brace for the next week. Remove it several times a day to do gentle range of motion. Take prednisone daily for 5 days. Take ibuprofen every 6 hours as needed for pain. Use the tramadol at bedtime as needed for pain. This medicine will make you drowsy. Follow-up if not improving in the next 1-2 weeks.

## 2015-06-19 ENCOUNTER — Ambulatory Visit: Payer: Self-pay | Attending: Family Medicine

## 2015-06-30 ENCOUNTER — Ambulatory Visit: Payer: Self-pay | Attending: Family Medicine | Admitting: Family Medicine

## 2015-06-30 ENCOUNTER — Encounter: Payer: Self-pay | Admitting: Family Medicine

## 2015-06-30 VITALS — BP 109/69 | HR 90 | Temp 98.4°F | Resp 16 | Ht 62.0 in | Wt 240.0 lb

## 2015-06-30 DIAGNOSIS — Z79899 Other long term (current) drug therapy: Secondary | ICD-10-CM | POA: Insufficient documentation

## 2015-06-30 DIAGNOSIS — J45909 Unspecified asthma, uncomplicated: Secondary | ICD-10-CM | POA: Insufficient documentation

## 2015-06-30 DIAGNOSIS — E669 Obesity, unspecified: Secondary | ICD-10-CM | POA: Insufficient documentation

## 2015-06-30 DIAGNOSIS — J4520 Mild intermittent asthma, uncomplicated: Secondary | ICD-10-CM | POA: Insufficient documentation

## 2015-06-30 DIAGNOSIS — M7989 Other specified soft tissue disorders: Secondary | ICD-10-CM | POA: Insufficient documentation

## 2015-06-30 DIAGNOSIS — G5602 Carpal tunnel syndrome, left upper limb: Secondary | ICD-10-CM | POA: Insufficient documentation

## 2015-06-30 DIAGNOSIS — M25561 Pain in right knee: Secondary | ICD-10-CM

## 2015-06-30 DIAGNOSIS — Z0000 Encounter for general adult medical examination without abnormal findings: Secondary | ICD-10-CM

## 2015-06-30 DIAGNOSIS — M222X1 Patellofemoral disorders, right knee: Secondary | ICD-10-CM

## 2015-06-30 DIAGNOSIS — Z6841 Body Mass Index (BMI) 40.0 and over, adult: Secondary | ICD-10-CM | POA: Insufficient documentation

## 2015-06-30 DIAGNOSIS — L989 Disorder of the skin and subcutaneous tissue, unspecified: Secondary | ICD-10-CM | POA: Insufficient documentation

## 2015-06-30 LAB — POCT GLYCOSYLATED HEMOGLOBIN (HGB A1C): Hemoglobin A1C: 5.5

## 2015-06-30 MED ORDER — ACETAMINOPHEN-CODEINE #3 300-30 MG PO TABS: 1.0000 | ORAL_TABLET | Freq: Three times a day (TID) | ORAL | Status: DC | PRN

## 2015-06-30 MED ORDER — ALBUTEROL SULFATE HFA 108 (90 BASE) MCG/ACT IN AERS: 2.0000 | INHALATION_SPRAY | Freq: Four times a day (QID) | RESPIRATORY_TRACT | Status: DC | PRN

## 2015-06-30 MED ORDER — GABAPENTIN 300 MG PO CAPS: 300.0000 mg | ORAL_CAPSULE | Freq: Three times a day (TID) | ORAL | Status: DC

## 2015-06-30 MED ORDER — NAPROXEN 500 MG PO TABS: 500.0000 mg | ORAL_TABLET | Freq: Two times a day (BID) | ORAL | Status: DC

## 2015-06-30 MED ORDER — CLINDAMYCIN HCL 300 MG PO CAPS: 300.0000 mg | ORAL_CAPSULE | Freq: Two times a day (BID) | ORAL | Status: DC

## 2015-06-30 NOTE — Assessment & Plan Note (Addendum)
A: scalp lesions  P: Clindamycin course

## 2015-06-30 NOTE — Assessment & Plan Note (Signed)
A; asthma P: Refilled albuterol

## 2015-06-30 NOTE — Assessment & Plan Note (Signed)
A: left carpal tunnel syndrome P: Neurology referral  Gabapentin at night Tylenol #3 for pain Naproxen

## 2015-06-30 NOTE — Progress Notes (Signed)
F/U UC due lt Arm, hand and finger numbness  Swelling on Legs and feet no changes since last visit  Stated has chest pressure with movement Pain scale #9 No tobacco user  No suicidal thoughts on the past two weeks

## 2015-06-30 NOTE — Patient Instructions (Addendum)
Laurie Reed was seen today for leg swelling.  Diagnoses and all orders for this visit:  Healthcare maintenance -     Cancel: HgB A1c -     HgB A1c -     Ambulatory referral to Dentistry  Asthma, mild intermittent, uncomplicated -     albuterol (PROVENTIL HFA;VENTOLIN HFA) 108 (90 Base) MCG/ACT inhaler; Inhale 2 puffs into the lungs every 6 (six) hours as needed for wheezing or shortness of breath.  Patellofemoral arthralgia of right knee -     Ambulatory referral to Orthopedic Surgery -     naproxen (NAPROSYN) 500 MG tablet; Take 1 tablet (500 mg total) by mouth 2 (two) times daily with a meal.  Left carpal tunnel syndrome -     Ambulatory referral to Neurology -     gabapentin (NEURONTIN) 300 MG capsule; Take 1 capsule (300 mg total) by mouth 3 (three) times daily. -     acetaminophen-codeine (TYLENOL #3) 300-30 MG tablet; Take 1 tablet by mouth every 8 (eight) hours as needed for moderate pain. -     naproxen (NAPROSYN) 500 MG tablet; Take 1 tablet (500 mg total) by mouth 2 (two) times daily with a meal.  Scalp lesion -     clindamycin (CLEOCIN) 300 MG capsule; Take 1 capsule (300 mg total) by mouth 2 (two) times daily.    Use firm carpal tunnel wrist brace   F/u in 6 weeks   Dr. Armen Pickup   Carpal Tunnel Syndrome Carpal tunnel syndrome is a condition that causes pain in your hand and arm. The carpal tunnel is a narrow area that is on the palm side of your wrist. Repeated wrist motion or certain diseases may cause swelling in the tunnel. This swelling can pinch the main nerve in the wrist (median nerve).  HOME CARE If You Have a Splint:  Wear it as told by your doctor. Remove it only as told by your doctor.  Loosen the splint if your fingers:  Become numb and tingle.  Turn blue and cold.  Keep the splint clean and dry. General Instructions  Take over-the-counter and prescription medicines only as told by your doctor.  Rest your wrist from any activity that may be  causing your pain. If needed, talk to your employer about changes that can be made in your work, such as getting a wrist pad to use while typing.  If directed, apply ice to the painful area:  Put ice in a plastic bag.  Place a towel between your skin and the bag.  Leave the ice on for 20 minutes, 2-3 times per day.  Keep all follow-up visits as told by your doctor. This is important.  Do any exercises as told by your doctor, physical therapist, or occupational therapist. GET HELP IF:  You have new symptoms.  Medicine does not help your pain.  Your symptoms get worse.   This information is not intended to replace advice given to you by your health care provider. Make sure you discuss any questions you have with your health care provider.   Document Released: 04/22/2011 Document Revised: 01/22/2015 Document Reviewed: 09/18/2014 Elsevier Interactive Patient Education [employer]

## 2015-06-30 NOTE — Progress Notes (Addendum)
Subjective:  Patient ID: Laurie Reed, female    DOB: 05-06-1986  Age: 30 y.o. MRN: [id number]  CC: No chief complaint on file.   HPI Laurie Reed presents for    1. L arm numbness: numbness in L arm, hand and fingers. This has been ongoing for many months. It worsened over past 2 months. She had radial wrist pain. She is dropping things at work. Tramadol had not helped with the pain.   2. Leg swelling: this is chronic. In setting of obesity. Comes and goes depending on how long she stands. This is associated with foot pain.   3. Scalp lesions: she has chronic itchy scalp with tender papules. Sometimes the papules drain pus. No fever or chills. She last relaxed her hair 2 months ago.   4. Shortness of breath: he has SOB and CP when she leans forward. She is a chronic non smoker. She has been out of albuterol. No CP or SOB at rest.   5. Chronic R knee pain: she went to sports medicine. They recommended weigh loss. She has not lost weight. She still has pain. She request second opinion.   Social History  Substance Use Topics  . Smoking status: Never Smoker   . Smokeless tobacco: Never Used  . Alcohol Use: No    Outpatient Prescriptions Prior to Visit  Medication Sig Dispense Refill  . acetaminophen (TYLENOL) 325 MG tablet Take 650 mg by mouth every 6 (six) hours as needed for mild pain.    Marland Kitchen albuterol (PROVENTIL HFA;VENTOLIN HFA) 108 (90 BASE) MCG/ACT inhaler Inhale 2 puffs into the lungs every 6 (six) hours as needed for wheezing or shortness of breath.    Marland Kitchen buPROPion (WELLBUTRIN XL) 150 MG 24 hr tablet Take 1 tablet (150 mg total) by mouth daily. 30 tablet 2  . cyclobenzaprine (FLEXERIL) 10 MG tablet Take 1 tablet (10 mg total) by mouth 3 (three) times daily as needed for muscle spasms. 30 tablet 0  . diphenoxylate-atropine (LOMOTIL) 2.5-0.025 MG tablet Take 2 tablets by mouth 4 (four) times daily as needed for diarrhea or loose stools. 24 tablet 0  . ibuprofen  (ADVIL,MOTRIN) 600 MG tablet Take 1 tablet (600 mg total) by mouth every 6 (six) hours as needed for moderate pain. 30 tablet 0  . meloxicam (MOBIC) 15 MG tablet Take 1 tablet (15 mg total) by mouth daily. 30 tablet 0  . naproxen (NAPROSYN) 500 MG tablet Take 1 tablet (500 mg total) by mouth 2 (two) times daily. 30 tablet 0  . traMADol (ULTRAM) 50 MG tablet Take 1 tablet (50 mg total) by mouth at bedtime as needed. 30 tablet 0  . Vitamin D, Ergocalciferol, (DRISDOL) 50000 UNITS CAPS capsule Take 1 capsule (50,000 Units total) by mouth every 7 (seven) days. For 12 weeks 4 capsule 2  . predniSONE (DELTASONE) 50 MG tablet Take 1 pill daily for 5 days. 5 tablet 0  . promethazine (PHENERGAN) 25 MG tablet Take 1 tablet (25 mg total) by mouth every 6 (six) hours as needed for nausea or vomiting. 10 tablet 0   No facility-administered medications prior to visit.    ROS Review of Systems  Constitutional: Positive for fatigue. Negative for fever and chills.  Eyes: Negative for visual disturbance.  Respiratory: Negative for shortness of breath.   Cardiovascular: Positive for leg swelling. Negative for chest pain.  Gastrointestinal: Negative for abdominal pain and blood in stool.  Musculoskeletal: Positive for myalgias (pain in feet ). Negative for back  pain and arthralgias.  Skin: Positive for rash.  Allergic/Immunologic: Negative for immunocompromised state.  Hematological: Negative for adenopathy. Does not bruise/bleed easily.  Psychiatric/Behavioral: Positive for sleep disturbance and dysphoric mood. Negative for suicidal ideas.    Objective:  BP 109/69 mmHg  Pulse 90  Temp(Src) 98.4 F (36.9 C) (Oral)  Resp 16  Ht  (1.575 m)  Wt 240 lb (108.863 kg)  BMI 43.89 kg/m2  SpO2 96%  LMP 05/18/2015  BP/Weight 06/30/2015 06/10/2015 06/09/2015  Systolic BP 109 120 110  Diastolic BP 69 78 65  Wt. (Lbs) 240 - -  BMI 43.89 - -   Wt Readings from Last 3 Encounters:  06/30/15 240 lb (108.863  kg)  01/21/15 231 lb (104.781 kg)  01/07/15 233 lb (105.688 kg)    Physical Exam  Constitutional: She is oriented to person, place, and time. She appears well-developed and well-nourished. No distress.  Morbidly obese   Cardiovascular: Normal rate, regular rhythm, normal heart sounds and intact distal pulses.   Pulmonary/Chest: Effort normal and breath sounds normal.  Musculoskeletal: She exhibits tenderness. She exhibits no edema (trace edema in legs ).       Left wrist: She exhibits decreased range of motion and tenderness. She exhibits no bony tenderness, no swelling, no effusion, no crepitus, no deformity and no laceration.  Neurological: She is alert and oriented to person, place, and time.  Skin: Skin is warm and dry. No rash noted.     Psychiatric: She has a normal mood and affect. Her speech is normal. Judgment and thought content normal. She is agitated. Cognition and memory are normal.    Lab Results  Component Value Date   HGBA1C 5.50 06/30/2015    Assessment & Plan:   Laurie Reed was seen today for leg swelling.  Diagnoses and all orders for this visit:  Healthcare maintenance -     Cancel: HgB A1c -     HgB A1c -     Ambulatory referral to Dentistry  Asthma, mild intermittent, uncomplicated -     albuterol (PROVENTIL HFA;VENTOLIN HFA) 108 (90 Base) MCG/ACT inhaler; Inhale 2 puffs into the lungs every 6 (six) hours as needed for wheezing or shortness of breath.  Patellofemoral arthralgia of right knee -     Ambulatory referral to Orthopedic Surgery -     Discontinue: naproxen (NAPROSYN) 500 MG tablet; Take 1 tablet (500 mg total) by mouth 2 (two) times daily with a meal. -     naproxen (NAPROSYN) 500 MG tablet; Take 1 tablet (500 mg total) by mouth 2 (two) times daily with a meal.  Left carpal tunnel syndrome -     Ambulatory referral to Neurology -     gabapentin (NEURONTIN) 300 MG capsule; Take 1 capsule (300 mg total) by mouth 3 (three) times daily. -      acetaminophen-codeine (TYLENOL #3) 300-30 MG tablet; Take 1 tablet by mouth every 8 (eight) hours as needed for moderate pain. -     Discontinue: naproxen (NAPROSYN) 500 MG tablet; Take 1 tablet (500 mg total) by mouth 2 (two) times daily with a meal. -     naproxen (NAPROSYN) 500 MG tablet; Take 1 tablet (500 mg total) by mouth 2 (two) times daily with a meal.  Scalp lesion -     clindamycin (CLEOCIN) 300 MG capsule; Take 1 capsule (300 mg total) by mouth 2 (two) times daily.   Follow-up: No Follow-up on file.   Dessa Phi MD   .

## 2015-07-11 ENCOUNTER — Encounter: Payer: Self-pay | Admitting: Clinical

## 2015-07-11 NOTE — Progress Notes (Signed)
Depression screen [facility] 2/9 06/30/2015 01/21/2015 01/07/2015 01/07/2015 10/08/2014  Decreased Interest 3 3 0 0 0  Down, Depressed, Hopeless 2 2 0 0 0  PHQ - 2 Score 5 5 0 0 0  Altered sleeping 3 3 - - -  Tired, decreased energy 3 3 - - -  Change in appetite 3 3 - - -  Feeling bad or failure about yourself  1 1 - - -  Trouble concentrating 3 3 - - -  Moving slowly or fidgety/restless 2 3 - - -  Suicidal thoughts 0 0 - - -  PHQ-9 Score 20 21 - - -    GAD7: 06-30-15   18(3,3,3,2,3,3,1) GAD7: 01-21-15     17

## 2015-07-23 ENCOUNTER — Ambulatory Visit: Payer: Self-pay | Admitting: Neurology

## 2015-07-30 ENCOUNTER — Ambulatory Visit: Payer: Self-pay | Admitting: Family Medicine

## 2015-08-15 ENCOUNTER — Ambulatory Visit (INDEPENDENT_AMBULATORY_CARE_PROVIDER_SITE_OTHER): Payer: Self-pay | Admitting: Neurology

## 2015-08-15 ENCOUNTER — Encounter: Payer: Self-pay | Admitting: Neurology

## 2015-08-15 VITALS — BP 100/64 | HR 97 | Ht 62.0 in | Wt 241.2 lb

## 2015-08-15 DIAGNOSIS — R202 Paresthesia of skin: Secondary | ICD-10-CM

## 2015-08-15 DIAGNOSIS — G5602 Carpal tunnel syndrome, left upper limb: Secondary | ICD-10-CM

## 2015-08-15 MED ORDER — GABAPENTIN 300 MG PO CAPS: 600.0000 mg | ORAL_CAPSULE | Freq: Three times a day (TID) | ORAL | Status: DC

## 2015-08-15 NOTE — Patient Instructions (Addendum)
NCS/EMG upper extremities  Increase gabapentin 600mg  three times daily.  Start to increase the dose slowly by 1 tablet each day to a goal of 600mg  three times daily Further recommendations based on the EMG results

## 2015-08-15 NOTE — Progress Notes (Signed)
[facility] Clinic Note - Initial Visit   Date: 08/15/2015  Laurie Reed MRN: [id number] DOB: 03-May-1986   Dear Dr. Armen Pickup:  Thank you for your kind referral of Laurie Reed for consultation of left hand paresthesias. Although her history is well known to you, please allow [place] to reiterate it for the purpose of our medical record. The patient was accompanied to the clinic by her three young children who also provides collateral information.     History of Present Illness: Laurie Reed is a 30 y.o. right-handed [place] American female presenting for evaluation of left hand paresthesias.    Starting around a year ago, she feels numbness and tingling over the fingers.  She has a "pinching sensation" in her wrist.  Symptoms are intermittent and worse with bending at the wrist, writing, and wakes her up from sleeping.  She was recommended to use a wrist splint which does not help.  She reports to dropping objects.  She also complains of entire left arm numbness.  She endorses neck pain.  She has the same symptoms in the right hand, but not as severe.    She was given a course of prednisone for possible tendonitis but did not have any benefit.  She was then given gabapentin and wrist splint for CTS without any relief.   Out-side paper records, electronic medical record, and images have been reviewed where available and summarized as:  Lab Results  Component Value Date   TSH 0.479 01/21/2015   Lab Results  Component Value Date   HGBA1C 5.50 06/30/2015   CT head wo contrast 09/10/2014:  Negative  Past Medical History  Diagnosis Date  . Migraine   . Reflux   . Environmental allergies   . Urinary tract infection   . Ovarian cyst   . Chlamydia   . Miscarriage   . Migraines   . Anxiety 2008  . Depression 2008    no meds, currenly ok  . Acid reflux 2004  . Ulcer of the stomach and intestine 2004  . Asthma 1993    no previous intubations  or hospitalizations     Past Surgical History  Procedure Laterality Date  . Wisdom tooth extraction  2007  . Colonoscopy  2004   . Multiple tooth extractions  2015     3 teeth removed      Medications:  Outpatient Encounter Prescriptions as of 08/15/2015  Medication Sig Note  . acetaminophen (TYLENOL) 325 MG tablet Take 650 mg by mouth every 6 (six) hours as needed for mild pain.   Marland Kitchen acetaminophen-codeine (TYLENOL #3) 300-30 MG tablet Take 1 tablet by mouth every 8 (eight) hours as needed for moderate pain.   Marland Kitchen albuterol (PROVENTIL HFA;VENTOLIN HFA) 108 (90 Base) MCG/ACT inhaler Inhale 2 puffs into the lungs every 6 (six) hours as needed for wheezing or shortness of breath.   Marland Kitchen buPROPion (WELLBUTRIN XL) 150 MG 24 hr tablet Take 1 tablet (150 mg total) by mouth daily.   . clindamycin (CLEOCIN) 300 MG capsule Take 1 capsule (300 mg total) by mouth 2 (two) times daily.   . cyclobenzaprine (FLEXERIL) 10 MG tablet Take 1 tablet (10 mg total) by mouth 3 (three) times daily as needed for muscle spasms.   . diphenoxylate-atropine (LOMOTIL) 2.5-0.025 MG tablet Take 2 tablets by mouth 4 (four) times daily as needed for diarrhea or loose stools.   . gabapentin (NEURONTIN) 300 MG capsule Take 1 capsule (300 mg total) by mouth 3 (three)  times daily.   Marland Kitchen. ibuprofen (ADVIL,MOTRIN) 600 MG tablet  08/15/2015: Received from: External Pharmacy  . naproxen (NAPROSYN) 500 MG tablet Take 1 tablet (500 mg total) by mouth 2 (two) times daily with a meal.   . predniSONE (DELTASONE) 10 MG tablet  08/15/2015: Received from: External Pharmacy  . traMADol (ULTRAM) 50 MG tablet  08/15/2015: Received from: External Pharmacy  . Vitamin D, Ergocalciferol, (DRISDOL) 50000 UNITS CAPS capsule Take 1 capsule (50,000 Units total) by mouth every 7 (seven) days. For 12 weeks    No facility-administered encounter medications on file as of 08/15/2015.     Allergies:  Allergies  Allergen Reactions  . Other Anaphylaxis    "20  different types of trees"  . Latex Hives and Itching  . Zofran Itching    Family History: Family History  Problem Relation Age of Onset  . Hypotension Neg Hx   . Anesthesia problems Neg Hx   . Malignant hyperthermia Neg Hx   . Pseudochol deficiency Neg Hx   . Cancer Neg Hx   . Asthma Mother   . Heart disease Mother   . Diabetes Mother   . Hypertension Mother   . Asthma Maternal Aunt   . Hypertension Maternal Aunt   . Diabetes Maternal Aunt   . Asthma Maternal Uncle   . Hypertension Maternal Uncle   . Diabetes Maternal Uncle   . Heart disease Maternal Grandmother   . Diabetes Maternal Grandmother   . Hypertension Maternal Grandmother   . Heart disease Maternal Grandfather   . Diabetes Maternal Grandfather   . Hypertension Maternal Grandfather   . Hypertension Father     Social History: Social History  Substance Use Topics  . Smoking status: Never Smoker   . Smokeless tobacco: Never Used  . Alcohol Use: No   Social History   Social History Narrative   Lives with 3 children almost 1-5.    Immediate family in [place]    [place]   Raised in [place]     Review of Systems:  CONSTITUTIONAL: No fevers, chills, night sweats, or weight loss.   EYES: No visual changes or eye pain ENT: No hearing changes.  No history of nose bleeds.   RESPIRATORY: No cough, wheezing and shortness of breath.   CARDIOVASCULAR: Negative for chest pain, and palpitations.   GI: Negative for abdominal discomfort, blood in stools or black stools.  No recent change in bowel habits.   GU:  No history of incontinence.   MUSCLOSKELETAL: +history of joint pain or swelling.  No myalgias.   SKIN: Negative for lesions, rash, and itching.   HEMATOLOGY/ONCOLOGY: Negative for prolonged bleeding, bruising easily, and swollen nodes.  No history of cancer.   ENDOCRINE: Negative for cold or heat intolerance, polydipsia or goiter.   PSYCH:  No depression or anxiety symptoms.   NEURO: As  Above.   Vital Signs:  BP 100/64 mmHg  Pulse 97  Ht 5\' 2"  (1.575 m)  Wt 241 lb 4 oz (109.43 kg)  BMI 44.11 kg/m2  SpO2 98%   General Medical Exam:   General:  Well appearing, comfortable.   Eyes/ENT: see cranial nerve examination.   Neck: No masses appreciated.  Full range of motion without tenderness.  No carotid bruits. Respiratory:  Clear to auscultation, good air entry bilaterally.   Cardiac:  Regular rate and rhythm, no murmur.   Extremities:  No deformities, edema, or skin discoloration.  Skin:  No rashes or lesions.  Neurological  Exam: MENTAL STATUS including orientation to time, place, person, recent and remote memory, attention span and concentration, language, and fund of knowledge is normal.  Speech is not dysarthric.  CRANIAL NERVES: II:  No visual field defects III-IV-VI: Pupils equal round and reactive to light.  Normal conjugate, extra-ocular eye movements in all directions of gaze.  No nystagmus.  No ptosis.   V:  Normal facial sensation.     VII:  Normal facial symmetry and movements.  No pathologic facial reflexes.  VIII:  Normal hearing and vestibular function.   IX-X:  Normal palatal movement.   XI:  Normal shoulder shrug and head rotation.   XII:  Normal tongue strength and range of motion, no deviation or fasciculation.  MOTOR:  No atrophy, fasciculations or abnormal movements.  No pronator drift.  Tone is normal.    Right Upper Extremity:    Left Upper Extremity:    Deltoid  5/5   Deltoid  5/5   Biceps  5/5   Biceps  5/5   Triceps  5/5   Triceps  5/5   Wrist extensors  5/5   Wrist extensors  5/5   Wrist flexors  5/5   Wrist flexors  5/5   Finger extensors  5/5   Finger extensors  5/5   Finger flexors  5/5   Finger flexors  5/5   Dorsal interossei  4/5   Dorsal interossei  4/5  Abductor pollicis  5-/5   Abductor pollicis  4/5   Tone (Ashworth scale)  0  Tone (Ashworth scale)  0   Right Lower Extremity:    Left Lower Extremity:    Hip flexors  5/5    Hip flexors  5/5   Hip extensors  5/5   Hip extensors  5/5   Knee flexors  5/5   Knee flexors  5/5   Knee extensors  5/5   Knee extensors  5/5   Dorsiflexors  5/5   Dorsiflexors  5/5   Plantarflexors  5/5   Plantarflexors  5/5   Toe extensors  5/5   Toe extensors  5/5   Toe flexors  5/5   Toe flexors  5/5   Tone (Ashworth scale)  0  Tone (Ashworth scale)  0   MSRs:  Right                                                                 Left brachioradialis 2+  brachioradialis 2+  biceps 2+  biceps 2+  triceps 2+  triceps 2+  patellar 2+  patellar 2+  ankle jerk 2+  ankle jerk 2+  Hoffman no  Hoffman no  plantar response down  plantar response down   SENSORY:  Normal and symmetric perception of light touch, pinprick, vibration, and proprioception.   Negative Tinel's at the wrist and elbow bilaterally.  COORDINATION/GAIT: Normal finger-to- nose-finger.  Intact rapid alternating movements bilaterally.  Gait is stable.  IMPRESSION: Ms. Laurie Reed is a 30 year-old female referred for evaluation of bilateral hand paresthesias, worse on the left.  Her exam shows pain limiting weakness of the intrinsic hand muscles.  She certainly has features of carpal tunnel syndrome and I would have expected there to be some improvement with the conservative therapies already tried.  To better characterize the nature of her pain, NCS/EMG of the upper extremities will be performed.  In the meantime, her gabapentin will be titrated to  three times daily and she was encouraged to use her wrist splint.  The duration of this appointment visit was 35 minutes of face-to-face time with the patient.  Greater than 50% of this time was spent in counseling, explanation of diagnosis, planning of further management, and coordination of care.   Thank you for allowing me to participate in patient's care.  If I can answer any additional questions, I would be pleased to do so.    Sincerely,    Donika K. Allena Katz,  DO

## 2015-08-26 ENCOUNTER — Ambulatory Visit (INDEPENDENT_AMBULATORY_CARE_PROVIDER_SITE_OTHER): Payer: Self-pay | Admitting: Neurology

## 2015-08-26 DIAGNOSIS — G5602 Carpal tunnel syndrome, left upper limb: Secondary | ICD-10-CM

## 2015-08-26 DIAGNOSIS — R202 Paresthesia of skin: Secondary | ICD-10-CM

## 2015-08-26 DIAGNOSIS — G5601 Carpal tunnel syndrome, right upper limb: Secondary | ICD-10-CM

## 2015-08-26 NOTE — Procedures (Signed)
[facility]  [address] [place], [address]  [place], [place] [address] Tel: [phone] Fax:  [phone] Test Date:  08/26/2015  Patient: Laurie Reed DOB: 1986-03-06 Physician: Nita Sickle, DO  Sex: Female Height:  Ref Phys: Nita Sickle, DO  ID#: [id number] Temp: 33.0C Technician: Judie Petit. Dean   Patient Complaints: This is a 30 year old female referred for evaluation of bilateral hand paresthesias and pain, worse on the left.  NCV & EMG Findings: Extensive electrodiagnostic testing of the left upper extremity and additional studies of the right shows: 1. Right palmar sensory response is abnormal. Left palmar sensory responses and bilateral median and ulnar sensory responses are within normal limits. 2. Bilateral median and ulnar motor responses are within normal limits. 3. There is no evidence of active or chronic motor axon loss changes affecting any of the tested muscles. Motor unit configuration and recruitment pattern is within normal limits.  Impression: 1. 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 Overall, these findings are very mild in degree electrically. 2. There is no evidence of a cervical radiculopathy affecting the upper extremities.   ___________________________ Nita Sickle, DO    Nerve Conduction Studies Anti Sensory Summary Table   Site NR Peak (ms) Norm Peak (ms) P-T Amp (V) Norm P-T Amp  Left Median Anti Sensory (2nd Digit)  33C  Wrist    2.9 <3.3 46.4 >20  Right Median Anti Sensory (2nd Digit)  33C  Wrist    2.9 <3.3 40.6 >20  Left Ulnar Anti Sensory (5th Digit)  33C  Wrist    2.8 <3.0 31.8 >18  Right Ulnar Anti Sensory (5th Digit)  33C  Wrist    2.4 <3.0 36.5 >18   Motor Summary Table   Site NR Onset (ms) Norm Onset (ms) O-P Amp (mV) Norm O-P Amp Site1 Site2 Delta-0 (ms) Dist (cm) Vel (m/s) Norm Vel (m/s)  Left Median Motor (Abd Poll Brev)  33C  Wrist     2.9 <3.9 10.7 >6 Elbow Wrist 3.7 22.0 59 >51  Elbow    6.6  10.1         Right Median Motor (Abd Poll Brev)  33C  Wrist    2.7 <3.9 13.9 >6 Elbow Wrist 4.4 27.0 61 >51  Elbow    7.1  12.4         Left Ulnar Motor (Abd Dig Minimi)  33C  Wrist    2.2 <3.0 10.7 >8 B Elbow Wrist 3.3 18.0 55 >51  B Elbow    5.5  10.4  A Elbow B Elbow 1.8 10.0 56 >51  A Elbow    7.3  10.4         Right Ulnar Motor (Abd Dig Minimi)  33C  Wrist    2.2 <3.0 11.8 >8 B Elbow Wrist 3.7 23.0 62 >51  B Elbow    5.9  11.5  A Elbow B Elbow 1.7 10.0 59 >51  A Elbow    7.6  10.9          Comparison Summary Table   Site NR Peak (ms) Norm Peak (ms) P-T Amp (V) Site1 Site2 Delta-P (ms) Norm Delta (ms)  Left Median/Ulnar Palm Comparison (Wrist - 8cm)  33C  Median Palm    1.8 <2.2 126.1 Median Palm Ulnar Palm 0.1   Ulnar Palm    1.7 <2.2 39.0      Right Median/Ulnar Palm Comparison (Wrist - 8cm)  33C  Median Palm    1.7 <2.2 132.6 Median Palm Ulnar Palm 0.5   Ulnar Palm    1.2 <2.2 24.4       EMG   Side Muscle Ins Act Fibs Psw Fasc Number Recrt Dur Dur. Amp Amp. Poly Poly. Comment  Left 1stDorInt Nml Nml Nml Nml Nml Nml Nml Nml Nml Nml Nml Nml N/A  Left Ext Indicis Nml Nml Nml Nml Nml Nml Nml Nml Nml Nml Nml Nml N/A  Left PronatorTeres Nml Nml Nml Nml Nml Nml Nml Nml Nml Nml Nml Nml N/A  Left Biceps Nml Nml Nml Nml Nml Nml Nml Nml Nml Nml Nml Nml N/A  Left Triceps Nml Nml Nml Nml Nml Nml Nml Nml Nml Nml Nml Nml N/A  Left Deltoid Nml Nml Nml Nml Nml Nml Nml Nml Nml Nml Nml Nml N/A  Right 1stDorInt Nml Nml Nml Nml Nml Nml Nml Nml Nml Nml Nml Nml N/A  Right Abd Poll Brev Nml Nml Nml Nml Nml Nml Nml Nml Nml Nml Nml Nml N/A  Right Ext Indicis Nml Nml Nml Nml Nml Nml Nml Nml Nml Nml Nml Nml N/A  Right PronatorTeres Nml Nml Nml Nml Nml Nml Nml Nml Nml Nml Nml Nml N/A  Right Biceps Nml Nml Nml Nml Nml Nml Nml Nml Nml Nml Nml Nml N/A  Right Triceps Nml Nml Nml Nml Nml Nml Nml Nml Nml Nml Nml Nml N/A  Right Deltoid Nml  Nml Nml Nml Nml Nml Nml Nml Nml Nml Nml Nml N/A      Waveforms:

## 2015-09-05 ENCOUNTER — Ambulatory Visit: Payer: Self-pay | Admitting: Neurology

## 2015-09-11 ENCOUNTER — Telehealth: Payer: Self-pay | Admitting: Family Medicine

## 2015-09-11 DIAGNOSIS — H52209 Unspecified astigmatism, unspecified eye: Secondary | ICD-10-CM

## 2015-09-11 NOTE — Telephone Encounter (Signed)
Patient came into facility to request a referral to an eye doctor. Please f/u

## 2015-09-12 ENCOUNTER — Encounter: Payer: Self-pay | Admitting: Neurology

## 2015-09-12 ENCOUNTER — Ambulatory Visit (INDEPENDENT_AMBULATORY_CARE_PROVIDER_SITE_OTHER): Payer: Self-pay | Admitting: Neurology

## 2015-09-12 VITALS — BP 108/70 | HR 110 | Temp 98.0°F | Ht 62.0 in | Wt 244.5 lb

## 2015-09-12 DIAGNOSIS — M6289 Other specified disorders of muscle: Secondary | ICD-10-CM

## 2015-09-12 DIAGNOSIS — G43709 Chronic migraine without aura, not intractable, without status migrainosus: Secondary | ICD-10-CM

## 2015-09-12 DIAGNOSIS — IMO0002 Reserved for concepts with insufficient information to code with codable children: Secondary | ICD-10-CM

## 2015-09-12 DIAGNOSIS — R29898 Other symptoms and signs involving the musculoskeletal system: Secondary | ICD-10-CM

## 2015-09-12 DIAGNOSIS — M79642 Pain in left hand: Secondary | ICD-10-CM

## 2015-09-12 DIAGNOSIS — G4440 Drug-induced headache, not elsewhere classified, not intractable: Secondary | ICD-10-CM

## 2015-09-12 DIAGNOSIS — G4441 Drug-induced headache, not elsewhere classified, intractable: Secondary | ICD-10-CM

## 2015-09-12 MED ORDER — TOPIRAMATE 25 MG PO TABS: 25.0000 mg | ORAL_TABLET | Freq: Every day | ORAL | Status: DC

## 2015-09-12 NOTE — Progress Notes (Signed)
Follow-up Visit   Date: 09/12/2015   Laurie Reed MRN: [id number] DOB: July 15, 1985   Interim History: Laurie Reed is a 30 y.o. right-handed [place] American female returning to the clinic for follow-up of left hand paresthesias.  The patient was accompanied to the clinic by self.  History of present illness: Starting around a year ago, she feels numbness and tingling over the fingers. She has a "pinching sensation" in her wrist. Symptoms are intermittent and worse with bending at the wrist, writing, and wakes her up from sleeping. She was recommended to use a wrist splint which does not help. She reports to dropping objects. She also complains of entire left arm numbness. She endorses neck pain. She has the same symptoms in the right hand, but not as severe.   She was given a course of prednisone for possible tendonitis but did not have any benefit. She was then given gabapentin and wrist splint for CTS without any relief.   UPDATE 09/12/2015:    Her NCS/EMG did not show any abnormalities to explain her left hand pain - no evidence of carpal tunnel syndrome or cervical radiculopathy.  She continues to have severe pain in the left hand, especially with hand movement and is wearing a wrist splint today.  She is starting to have greater difficulty with fine movement such as trying to braid her daughter's hair. She again says that her entire upper extremity become numb and cold which is when the pain becomes severe.  Arm position does not seem to trigger pain or paresthesias.  She complains of migraines for a number of years. Pain is holocephalic, occuring every day and lasting all day.  She has photophobia, phonophobia, nausea, and vomiting.  She has tried imitrex in the past which did not help.  She takes Aleve daily, but does not notice significant benefit.    Medications:  Current Outpatient Prescriptions on File Prior to Visit  Medication Sig Dispense Refill  .  acetaminophen (TYLENOL) 325 MG tablet Take 650 mg by mouth every 6 (six) hours as needed for mild pain.    Marland Kitchen acetaminophen-codeine (TYLENOL #3) 300-30 MG tablet Take 1 tablet by mouth every 8 (eight) hours as needed for moderate pain. 60 tablet 0  . albuterol (PROVENTIL HFA;VENTOLIN HFA) 108 (90 Base) MCG/ACT inhaler Inhale 2 puffs into the lungs every 6 (six) hours as needed for wheezing or shortness of breath. 8 g 1  . buPROPion (WELLBUTRIN XL) 150 MG 24 hr tablet Take 1 tablet (150 mg total) by mouth daily. 30 tablet 2  . cyclobenzaprine (FLEXERIL) 10 MG tablet Take 1 tablet (10 mg total) by mouth 3 (three) times daily as needed for muscle spasms. 30 tablet 0  . diphenoxylate-atropine (LOMOTIL) 2.5-0.025 MG tablet Take 2 tablets by mouth 4 (four) times daily as needed for diarrhea or loose stools. 24 tablet 0  . gabapentin (NEURONTIN) 300 MG capsule Take 2 capsules (600 mg total) by mouth 3 (three) times daily. 180 capsule 3  . ibuprofen (ADVIL,MOTRIN) 600 MG tablet   0  . naproxen (NAPROSYN) 500 MG tablet Take 1 tablet (500 mg total) by mouth 2 (two) times daily with a meal. 60 tablet 0  . predniSONE (DELTASONE) 10 MG tablet   0  . traMADol (ULTRAM) 50 MG tablet   0  . Vitamin D, Ergocalciferol, (DRISDOL) 50000 UNITS CAPS capsule Take 1 capsule (50,000 Units total) by mouth every 7 (seven) days. For 12 weeks 4 capsule 2  . [DISCONTINUED]  omeprazole (PRILOSEC) 40 MG capsule Take 1 capsule (40 mg total) by mouth daily. (Patient not taking: Reported on 10/08/2014) 30 capsule 0  . [DISCONTINUED] propranolol (INDERAL) 20 MG tablet Take 2 tablets (40 mg total) by mouth 2 (two) times daily. (Patient not taking: Reported on 09/09/2014) 120 tablet 1   No current facility-administered medications on file prior to visit.    Allergies:  Allergies  Allergen Reactions  . Other Anaphylaxis    "20 different types of trees"  . Latex Hives and Itching  . Zofran Itching    Review of Systems:    CONSTITUTIONAL: No fevers, chills, night sweats, or weight loss.  EYES: No visual changes or eye pain ENT: No hearing changes.  No history of nose bleeds.   RESPIRATORY: No cough, wheezing and shortness of breath.   CARDIOVASCULAR: Negative for chest pain, and palpitations.   GI: Negative for abdominal discomfort, blood in stools or black stools.  No recent change in bowel habits.   GU:  No history of incontinence.   MUSCLOSKELETAL: +history of joint pain or swelling.  No myalgias.   SKIN: Negative for lesions, rash, and itching.   ENDOCRINE: Negative for cold or heat intolerance, polydipsia or goiter.   PSYCH:  + depression or anxiety symptoms.   NEURO: As Above.   Vital Signs:  BP 108/70 mmHg  Pulse 110  Temp(Src) 98 F (36.7 C)  Ht 5\' 2"  (1.575 m)  Wt 244 lb 8 oz (110.904 kg)  BMI 44.71 kg/m2  SpO2 97%  Neurological Exam: MENTAL STATUS including orientation to time, place, person, recent and remote memory, attention span and concentration, language, and fund of knowledge is normal.  Speech is not dysarthric.  CRANIAL NERVES:  Face is symmetric.  MOTOR:  Motor strength is 5/5 in all extremities, except left interosseus muscles and ABP is 4/5 ?pain limited weakness in the left hand.  No atrophy, fasciculations or abnormal movements.  No pronator drift.  Tone is normal.    MSRs:  Reflexes are 2+/4 throughout  SENSORY:  Intact to vibration and temperature.  COORDINATION/GAIT:    Gait narrow based and stable.   Data: NCS/EMG of the upper extremities 08/26/2015:  1. 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 Overall, these findings are very mild in degree electrically. 2. There is no evidence of a cervical radiculopathy affecting the upper extremities.  IMPRESSION/PLAN: Ms. Laurie Reed is a 30 year-old female returning for evaluation of left hand pain and paresthesias.  Her NCS/EMG of the left hand was  normal and did not show neuropathy, carpal tunnel syndrome, or radiculopathy.  She described numbness and cold sensation over the entire left upper extremity which does not follow a nerve distribution.  I will send her for vascular studies of the arm to be sure thoracic outlet syndrome is not missed.  MRI cervical spine will also be ordered, but based on her history, radiculopathy is unlikely.  She is visibly very uncomfortable by her pain.  Some of her hand pain may be due to tendonitis and I have asked her to this to her Sports Medicine provider.  For her headaches, she has (1) medication overuse headaches and was instructed to limit all rescue medications to twice per week and (2) chronic migraine for which topiramate 25mg  daily is started.      Return to clinic in 2-3 months   The duration of this appointment visit was 40 minutes of face-to-face  time with the patient.  Greater than 50% of this time was spent in counseling, explanation of diagnosis, planning of further management, and coordination of care.   Thank you for allowing me to participate in patient's care.  If I can answer any additional questions, I would be pleased to do so.    Sincerely,    Donika K. Posey Pronto, DO

## 2015-09-12 NOTE — Addendum Note (Signed)
Addended by: Vivien RotaRIVER, Okey Zelek H on: 9/60/45404/28/2017 03:37 PM   Modules accepted: Orders

## 2015-09-12 NOTE — Patient Instructions (Addendum)
1.  MRI cervical spine wwo contrast 2.  Peripheral vascular studies of the arm 3.  Start topiramate 25mg  daily 4.  Discuss with your sports medicine provider about possible tendonitis affecting the hand  Return to clinic in 2-3 months

## 2015-09-16 ENCOUNTER — Telehealth: Payer: Self-pay | Admitting: *Deleted

## 2015-09-16 NOTE — Telephone Encounter (Signed)
For what reason does patient need to see the eye doctor, I need a problem/diagnosis

## 2015-09-16 NOTE — Telephone Encounter (Signed)
No plans for pregnancy at this time.  Instructed her to call and let us know if she decides to become pregnant.

## 2015-09-16 NOTE — Telephone Encounter (Signed)
-----   Message from Glendale Chardonika K Patel, DO sent at 09/16/2015 12:35 PM EDT ----- Regarding: Topiramate question Morrie Sheldonshley,  Can you call and confirm that the patient does not have plans for pregnancy at this time?  If she does, we will need to stop topiramate.  If not, okay to continue the medication.    Thanks,  [employer]rah[employer] Entertainment[employer]ika

## 2015-09-17 ENCOUNTER — Encounter: Payer: Self-pay | Admitting: Family Medicine

## 2015-09-17 ENCOUNTER — Ambulatory Visit (INDEPENDENT_AMBULATORY_CARE_PROVIDER_SITE_OTHER): Payer: Self-pay | Admitting: Family Medicine

## 2015-09-17 VITALS — BP 126/84 | Ht 62.0 in | Wt 244.0 lb

## 2015-09-17 DIAGNOSIS — M222X1 Patellofemoral disorders, right knee: Secondary | ICD-10-CM

## 2015-09-17 DIAGNOSIS — M25561 Pain in right knee: Secondary | ICD-10-CM

## 2015-09-17 DIAGNOSIS — M79672 Pain in left foot: Secondary | ICD-10-CM

## 2015-09-17 DIAGNOSIS — M79671 Pain in right foot: Secondary | ICD-10-CM

## 2015-09-17 NOTE — Progress Notes (Signed)
  Laurie Reed - 30 y.o. female MRN [id number]  Date of birth: Oct 20, 1985  SUBJECTIVE:  Including CC & ROS.  Laurie Reed is a 30 y.o. female who presents today for R knee pain and B/L foot pain.    Knee Pain R, f/u visit  - patient presents today for ongoing right anterior knee pain described as aching/burning feeling. Previously seen in August 2016 at sports medicine where she was told she has patellofemoral pain syndrome and recommended weight loss along with VMO exercises and Mobic with intermittent injections. She has not done any of this and continues to have pain. Worse when sitting for prolonged periods of time.  B/L foot pain - ongoing foot pain localized generally  all over her feet bilaterally. This is been ongoing as well and is worse at the end of the day. She denies frank paresthesias going into her toes and was told to lose weight for this in the past but has not done this.    PMHx - Updated and reviewed.  Contributory factors include: Morbid obesity PSHx - Updated and reviewed.  Contributory factors include:  Negative  FHx - Updated and reviewed.  Contributory factors include:  DM, HTN  Social Hx - Updated and reviewed. Contributory factors include: Non smoker  Medications - Updated and reviewed    12 point ROS negative other than per HPI.   Exam:  Filed Vitals:   09/17/15 1423  BP: 146/130   Gen: NAD, AAO 3, obese FM  Cardio- RRR Pulm - Normal respiratory effort/rate Skin: No rashes or erythema Extremities: No edema  Vascular: pulses +2 bilateral upper and lower extremity Psych: Normal affect   R Knee:  Normal to inspection with no erythema or effusion or obvious bony abnormalities.  No obvious Baker's cysts Palpation normal with no warmth or joint line tenderness or patellar tenderness or condyle tenderness.  No TTP along infrapatellar or pes anserine bursas.   ROM normal in flexion (135 degrees) and extension (0 degrees) and lower leg rotation. Ligaments  with solid consistent endpoints including ACL, PCL, LCL, MCL.  Negative Anterior Drawer/Lachman/Pivot Shift Negative Mcmurray's and provocative meniscal tests including Thessaly and Apley compression testing  + painful patellar compression with + J sign.  Normal Patellar glide.  No apprehension  Patellar and quadriceps tendons unremarkable. Hamstring and quadriceps strength is normal.  Neurovascularly intact B/L LE   Imaging:  3 view R knee x-ray ordered

## 2015-09-17 NOTE — Telephone Encounter (Signed)
Pt stated use glasses and needing  yearly check  Hx astigmatism

## 2015-09-17 NOTE — Assessment & Plan Note (Signed)
Most likely 2/2 to pes planus with multiple risks factors including obesity and pes planus Green inserts given.  Recommend more support in her shoes.

## 2015-09-17 NOTE — Assessment & Plan Note (Signed)
Previously seen for this.  Recommend x-ray to r/o underlying pathology but otherwise reinforcement of previous Tx plan including mobic, PRN injection, VMO strengthening.   F/U PRN.

## 2015-09-18 DIAGNOSIS — H52209 Unspecified astigmatism, unspecified eye: Secondary | ICD-10-CM | POA: Insufficient documentation

## 2015-09-18 NOTE — Telephone Encounter (Signed)
Noted- referral placed

## 2015-09-25 ENCOUNTER — Ambulatory Visit
Admission: RE | Admit: 2015-09-25 | Discharge: 2015-09-25 | Disposition: A | Discharge status: Home / Self Care | Payer: No Typology Code available for payment source | Source: Ambulatory Visit | Attending: Neurology | Admitting: Neurology

## 2015-09-25 ENCOUNTER — Ambulatory Visit (HOSPITAL_COMMUNITY)
Admission: RE | Admit: 2015-09-25 | Discharge: 2015-09-25 | Disposition: A | Discharge status: Home / Self Care | Payer: No Typology Code available for payment source | Source: Ambulatory Visit | Attending: Cardiovascular Disease | Admitting: Cardiovascular Disease

## 2015-09-25 DIAGNOSIS — G43709 Chronic migraine without aura, not intractable, without status migrainosus: Secondary | ICD-10-CM

## 2015-09-25 DIAGNOSIS — G4440 Drug-induced headache, not elsewhere classified, not intractable: Secondary | ICD-10-CM

## 2015-09-25 DIAGNOSIS — IMO0002 Reserved for concepts with insufficient information to code with codable children: Secondary | ICD-10-CM

## 2015-09-25 DIAGNOSIS — M6289 Other specified disorders of muscle: Secondary | ICD-10-CM

## 2015-09-25 DIAGNOSIS — G4441 Drug-induced headache, not elsewhere classified, intractable: Secondary | ICD-10-CM | POA: Insufficient documentation

## 2015-09-25 DIAGNOSIS — M79642 Pain in left hand: Secondary | ICD-10-CM | POA: Insufficient documentation

## 2015-09-25 DIAGNOSIS — R29898 Other symptoms and signs involving the musculoskeletal system: Secondary | ICD-10-CM

## 2015-09-25 MED ORDER — GADOBENATE DIMEGLUMINE 529 MG/ML IV SOLN
20.0000 mL | Freq: Once | INTRAVENOUS | Status: AC | PRN
  Administered 2015-09-25: 20 mL via INTRAVENOUS

## 2015-10-30 ENCOUNTER — Encounter: Payer: Self-pay | Admitting: Family Medicine

## 2015-10-30 ENCOUNTER — Ambulatory Visit: Payer: No Typology Code available for payment source | Attending: Family Medicine | Admitting: Family Medicine

## 2015-10-30 VITALS — BP 106/70 | HR 100 | Temp 98.6°F | Resp 18 | Ht 62.0 in | Wt 243.0 lb

## 2015-10-30 DIAGNOSIS — Z79899 Other long term (current) drug therapy: Secondary | ICD-10-CM | POA: Insufficient documentation

## 2015-10-30 DIAGNOSIS — M25561 Pain in right knee: Secondary | ICD-10-CM | POA: Insufficient documentation

## 2015-10-30 DIAGNOSIS — M353 Polymyalgia rheumatica: Secondary | ICD-10-CM | POA: Insufficient documentation

## 2015-10-30 DIAGNOSIS — M79642 Pain in left hand: Secondary | ICD-10-CM | POA: Insufficient documentation

## 2015-10-30 DIAGNOSIS — M2550 Pain in unspecified joint: Secondary | ICD-10-CM | POA: Insufficient documentation

## 2015-10-30 DIAGNOSIS — M79641 Pain in right hand: Secondary | ICD-10-CM | POA: Insufficient documentation

## 2015-10-30 DIAGNOSIS — G5601 Carpal tunnel syndrome, right upper limb: Secondary | ICD-10-CM | POA: Insufficient documentation

## 2015-10-30 DIAGNOSIS — M545 Low back pain, unspecified: Secondary | ICD-10-CM

## 2015-10-30 DIAGNOSIS — E559 Vitamin D deficiency, unspecified: Secondary | ICD-10-CM

## 2015-10-30 DIAGNOSIS — M222X1 Patellofemoral disorders, right knee: Secondary | ICD-10-CM

## 2015-10-30 DIAGNOSIS — H52209 Unspecified astigmatism, unspecified eye: Secondary | ICD-10-CM

## 2015-10-30 MED ORDER — AMITRIPTYLINE HCL 50 MG PO TABS: 50.0000 mg | ORAL_TABLET | Freq: Every day | ORAL | Status: DC

## 2015-10-30 MED ORDER — KETOROLAC TROMETHAMINE 30 MG/ML IJ SOLN
30.0000 mg | Freq: Once | INTRAMUSCULAR | Status: AC
  Administered 2015-10-30: 30 mg via INTRAMUSCULAR

## 2015-10-30 MED ORDER — KETOROLAC TROMETHAMINE 30 MG/ML IJ SOLN: 30.0000 mg | Freq: Once | INTRAMUSCULAR | Status: DC

## 2015-10-30 MED ORDER — NAPROXEN 500 MG PO TABS
500.0000 mg | ORAL_TABLET | Freq: Two times a day (BID) | ORAL | Status: DC
Start: 2015-10-30 — End: 2016-07-01

## 2015-10-30 MED ORDER — CYCLOBENZAPRINE HCL 10 MG PO TABS: 10.0000 mg | ORAL_TABLET | Freq: Three times a day (TID) | ORAL | Status: DC | PRN

## 2015-10-30 NOTE — Progress Notes (Signed)
Subjective:  Patient ID: Laurie Reed, female    DOB: 01/29/1986  Age: 30 y.o. MRN: [id number]  CC: Arm Pain and Numbness   HPI Laurie Reed with asthma, morbid obesity  presents for   1. Pain and numbness in hands: persistent pain in both hands that is worse in the L hand. Associated with pain and numbness in the entire L arm that come and goes. She also gets some numbness in the R hand. 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 She is a single mom. She has pain with lifting. There is no joint swelling or redness. No rash or skin changes. She has been evaluated by neurology. 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 No cervical radiculopathy. No L median nerve neuropathy. MRI cervical spine was normal on 09/25/15 . Vascular US B/L was normal on 09/25/15. She continues to take gabapentin. No naproxen, tyleonl #3, tramadol or flexeril. She feels her pain is worsening and interfering with her work. Her pain interferes with her sleep. Her pain also prevents her from picking up her youngest daughter. She denies anxiety and depression.   2. Back pain: she has history of back pain. No known injury. She has mid and lower back pain. Back seems to sharp and exacerbated by bending over. Pain radiates up towards posterior neck. There is no pain or weakness in legs. No fecal or urinary incontinence. She walks for exercise sometimes.   3. Astigmatism: she has requested referral to ophthalmology. There was no opthalmology for astigmatism. She is not wearing glasses. She has not yet called to schedule and appt with the recommended community optometrist.   Social History  Substance Use Topics  . Smoking status: Never Smoker   . Smokeless tobacco: Never Used  . Alcohol Use: No    Outpatient Prescriptions Prior to Visit  Medication Sig Dispense Refill  . acetaminophen (TYLENOL) 325 MG tablet Take 650 mg by mouth every 6 (six) hours as needed for mild pain.    Marland Kitchen. acetaminophen-codeine  (TYLENOL #3) 300-30 MG tablet Take 1 tablet by mouth every 8 (eight) hours as needed for moderate pain. 60 tablet 0  . albuterol (PROVENTIL HFA;VENTOLIN HFA) 108 (90 Base) MCG/ACT inhaler Inhale 2 puffs into the lungs every 6 (six) hours as needed for wheezing or shortness of breath. 8 g 1  . buPROPion (WELLBUTRIN XL) 150 MG 24 hr tablet Take 1 tablet (150 mg total) by mouth daily. 30 tablet 2  . cyclobenzaprine (FLEXERIL) 10 MG tablet Take 1 tablet (10 mg total) by mouth 3 (three) times daily as needed for muscle spasms. 30 tablet 0  . diphenoxylate-atropine (LOMOTIL) 2.5-0.025 MG tablet Take 2 tablets by mouth 4 (four) times daily as needed for diarrhea or loose stools. 24 tablet 0  . gabapentin (NEURONTIN) 300 MG capsule Take 2 capsules (600 mg total) by mouth 3 (three) times daily. 180 capsule 3  . ibuprofen (ADVIL,MOTRIN) 600 MG tablet   0  . naproxen (NAPROSYN) 500 MG tablet Take 1 tablet (500 mg total) by mouth 2 (two) times daily with a meal. 60 tablet 0  . predniSONE (DELTASONE) 10 MG tablet   0  . topiramate (TOPAMAX) 25 MG tablet Take 1 tablet (25 mg total) by mouth daily. 30 tablet 5  . traMADol (ULTRAM) 50 MG tablet   0  . Vitamin D, Ergocalciferol, (DRISDOL) 50000 UNITS CAPS capsule Take 1 capsule (50,000 Units total) by mouth every 7 (seven) days. For 12 weeks 4  capsule 2   No facility-administered medications prior to visit.    ROS Review of Systems  Constitutional: Negative for fever and chills.  Eyes: Negative for visual disturbance.  Respiratory: Negative for shortness of breath.   Cardiovascular: Negative for chest pain.  Gastrointestinal: Negative for abdominal pain and blood in stool.  Musculoskeletal: Positive for myalgias, back pain and arthralgias. Negative for joint swelling, gait problem, neck pain and neck stiffness.  Skin: Negative for rash.  Allergic/Immunologic: Negative for immunocompromised state.  Hematological: Negative for adenopathy. Does not  bruise/bleed easily.  Psychiatric/Behavioral: Positive for sleep disturbance. Negative for suicidal ideas and dysphoric mood. The patient is not nervous/anxious.     Objective:  BP 106/70 mmHg  Pulse 100  Temp(Src) 98.6 F (37 C) (Oral)  Resp 18  Ht 5\' 2"  (1.575 m)  Wt 243 lb (110.224 kg)  BMI 44.43 kg/m2  SpO2 99%  LMP 10/08/2015  BP/Weight 10/30/2015 09/17/2015 09/12/2015  Systolic BP 106 126 108  Diastolic BP 70 84 70  Wt. (Lbs) 243 244 244.5  BMI 44.43 44.62 44.71   Physical Exam  Constitutional: She is oriented to person, place, and time. She appears well-developed and well-nourished. No distress.  HENT:  Head: Normocephalic and atraumatic.  Cardiovascular: Normal rate, regular rhythm, normal heart sounds and intact distal pulses.   Pulmonary/Chest: Effort normal and breath sounds normal.  Musculoskeletal: She exhibits tenderness. She exhibits no edema.       Left wrist: She exhibits tenderness.       Thoracic back: She exhibits decreased range of motion and tenderness. She exhibits no bony tenderness, no swelling, no edema, no deformity, no laceration, no pain, no spasm and normal pulse.       Lumbar back: She exhibits decreased range of motion and tenderness. She exhibits no bony tenderness, no swelling, no edema, no deformity, no laceration, no pain, no spasm and normal pulse.       Arms:      Left hand: She exhibits tenderness. She exhibits normal range of motion, no bony tenderness and no swelling. Normal sensation noted. Normal strength noted.  Neurological: She is alert and oriented to person, place, and time.  Skin: Skin is warm and dry. No rash noted.  Psychiatric: She has a normal mood and affect.   Lab Results  Component Value Date   HGBA1C 5.50 06/30/2015     Assessment & Plan:   There are no diagnoses linked to this encounter. Orah was seen today for arm pain and numbness.  Diagnoses and all orders for this visit:  Midline low back pain without  sciatica -     Vitamin D, 25-hydroxy -     Vitamin B12 -     amitriptyline (ELAVIL) 50 MG tablet; Take 1 tablet (50 mg total) by mouth at bedtime. -     cyclobenzaprine (FLEXERIL) 10 MG tablet; Take 1 tablet (10 mg total) by mouth 3 (three) times daily as needed for muscle spasms. -     naproxen (NAPROSYN) 500 MG tablet; Take 1 tablet (500 mg total) by mouth 2 (two) times daily with a meal. -     ketorolac (TORADOL) 30 MG/ML injection 30 mg; Inject 1 mL (30 mg total) into the muscle once. -     Ambulatory referral to Pain Clinic -     Discontinue: ketorolac (TORADOL) 30 MG/ML injection 30 mg; Inject 1 mL (30 mg total) into the muscle once.  Astigmatism, unspecified laterality  Patellofemoral arthralgia of right knee  Right carpal tunnel syndrome  Bilateral hand pain -     Vitamin D, 25-hydroxy -     Vitamin B12 -     amitriptyline (ELAVIL) 50 MG tablet; Take 1 tablet (50 mg total) by mouth at bedtime. -     cyclobenzaprine (FLEXERIL) 10 MG tablet; Take 1 tablet (10 mg total) by mouth 3 (three) times daily as needed for muscle spasms. -     naproxen (NAPROSYN) 500 MG tablet; Take 1 tablet (500 mg total) by mouth 2 (two) times daily with a meal. -     ketorolac (TORADOL) 30 MG/ML injection 30 mg; Inject 1 mL (30 mg total) into the muscle once. -     Ambulatory referral to Pain Clinic -     Discontinue: ketorolac (TORADOL) 30 MG/ML injection 30 mg; Inject 1 mL (30 mg total) into the muscle once.  Polymyalgia (HCC) -     ANA -     Rheumatoid factor -     Uric acid -     Sjogrens syndrome-A extractable nuclear antibody -     Sjogrens syndrome-B extractable nuclear antibody -     Cyclic Citrul Peptide Antibody, IGG -     Sedimentation rate -     C-reactive protein -     Anti-DNA antibody, double-stranded -     Ambulatory referral to Pain Clinic -     Discontinue: ketorolac (TORADOL) 30 MG/ML injection 30 mg; Inject 1 mL (30 mg total) into the muscle once.  Polyarthralgia -      ANA -     Rheumatoid factor -     Uric acid -     Sjogrens syndrome-A extractable nuclear antibody -     Sjogrens syndrome-B extractable nuclear antibody -     Cyclic Citrul Peptide Antibody, IGG -     Sedimentation rate -     C-reactive protein -     Anti-DNA antibody, double-stranded -     Ambulatory referral to Pain Clinic -     Discontinue: ketorolac (TORADOL) 30 MG/ML injection 30 mg; Inject 1 mL (30 mg total) into the muscle once.   Meds ordered this encounter  Medications  . amitriptyline (ELAVIL) 50 MG tablet    Sig: Take 1 tablet (50 mg total) by mouth at bedtime.    Dispense:  30 tablet    Refill:  1  . cyclobenzaprine (FLEXERIL) 10 MG tablet    Sig: Take 1 tablet (10 mg total) by mouth 3 (three) times daily as needed for muscle spasms.    Dispense:  30 tablet    Refill:  0  . naproxen (NAPROSYN) 500 MG tablet    Sig: Take 1 tablet (500 mg total) by mouth 2 (two) times daily with a meal.    Dispense:  60 tablet    Refill:  0  . ketorolac (TORADOL) 30 MG/ML injection 30 mg    Sig:   . DISCONTD: ketorolac (TORADOL) 30 MG/ML injection 30 mg    Sig:     Follow-up: Return in about 4 weeks (around 11/27/2015) for pap.   Dessa Phi MD

## 2015-10-30 NOTE — Assessment & Plan Note (Signed)
Patient has significant pain and numbness in her UEs without neuro or vascular compromise. She also has back pain. Right knee and ankle pain.   Plan: Toradol 30 mg IM x one   Recheck vit D, she has had vit D deficiency in the past Check inflammatory markers  7 days of flexeril and naproxen, advised patient limit to 7 days due to hx of medication overuse HA Add elavil 50 mg nightly Advised regular exercise and weight loss Pain management referral

## 2015-10-30 NOTE — Patient Instructions (Addendum)
Tawan was seen today for arm pain and numbness.  Diagnoses and all orders for this visit:  Midline low back pain without sciatica -     Vitamin D, 25-hydroxy -     Vitamin B12 -     amitriptyline (ELAVIL) 50 MG tablet; Take 1 tablet (50 mg total) by mouth at bedtime. -     cyclobenzaprine (FLEXERIL) 10 MG tablet; Take 1 tablet (10 mg total) by mouth 3 (three) times daily as needed for muscle spasms. -     naproxen (NAPROSYN) 500 MG tablet; Take 1 tablet (500 mg total) by mouth 2 (two) times daily with a meal. -     ketorolac (TORADOL) 30 MG/ML injection 30 mg; Inject 1 mL (30 mg total) into the muscle once.  Astigmatism, unspecified laterality  Patellofemoral arthralgia of right knee  Right carpal tunnel syndrome  Bilateral hand pain -     Vitamin D, 25-hydroxy -     Vitamin B12 -     amitriptyline (ELAVIL) 50 MG tablet; Take 1 tablet (50 mg total) by mouth at bedtime. -     cyclobenzaprine (FLEXERIL) 10 MG tablet; Take 1 tablet (10 mg total) by mouth 3 (three) times daily as needed for muscle spasms. -     naproxen (NAPROSYN) 500 MG tablet; Take 1 tablet (500 mg total) by mouth 2 (two) times daily with a meal. -     ketorolac (TORADOL) 30 MG/ML injection 30 mg; Inject 1 mL (30 mg total) into the muscle once.  Polymyalgia (HCC) -     ANA -     Rheumatoid factor -     Uric acid -     Sjogrens syndrome-A extractable nuclear antibody -     Sjogrens syndrome-B extractable nuclear antibody -     Cyclic Citrul Peptide Antibody, IGG -     Sedimentation rate -     C-reactive protein -     Anti-DNA antibody, double-stranded  Polyarthralgia -     ANA -     Rheumatoid factor -     Uric acid -     Sjogrens syndrome-A extractable nuclear antibody -     Sjogrens syndrome-B extractable nuclear antibody -     Cyclic Citrul Peptide Antibody, IGG -     Sedimentation rate -     C-reactive protein -     Anti-DNA antibody, double-stranded  Take naproxen 500 mg BID with food for one week  then stop Take flexeril for one week total, then stop Take elavil 50 mg nightly to help with pain and help you sleep.   Low out cost optometrist (about $65.00 for office visit)  1. Dr. Wynona LunaSteven Bernstorf Phone # [phone] [address]  [place], [place] [address]   2. [facility]  Phone # [phone] [address] [place], [place] [address]    You will be called with lab results   F/u with me in 4 weeks for pap smear   Dr. Armen PickupFunches

## 2015-10-30 NOTE — Progress Notes (Signed)
F/U Lt hand pain and Lt numbness on  Back pain  Pain scale #9 No tobacco user  No suicidal thoughts in the past two weeks

## 2015-10-31 LAB — SJOGRENS SYNDROME-B EXTRACTABLE NUCLEAR ANTIBODY: SSB (La) (ENA) Antibody, IgG: <1

## 2015-10-31 LAB — VITAMIN D 25 HYDROXY (VIT D DEFICIENCY, FRACTURES): Vit D, 25-Hydroxy: 12 ng/mL — ABNORMAL LOW (ref 30–100)

## 2015-10-31 LAB — VITAMIN B12: Vitamin B-12: 1065 pg/mL (ref 200–1100)

## 2015-10-31 LAB — RHEUMATOID FACTOR: Rhuematoid fact SerPl-aCnc: <10 IU/mL (ref <=14)

## 2015-10-31 LAB — ANTI-DNA ANTIBODY, DOUBLE-STRANDED

## 2015-10-31 LAB — C-REACTIVE PROTEIN: CRP: 2 mg/dL — AB (ref <0.60)

## 2015-10-31 LAB — CYCLIC CITRUL PEPTIDE ANTIBODY, IGG: Cyclic Citrullin Peptide Ab: <16 Units

## 2015-10-31 LAB — ANA: ANA: NEGATIVE

## 2015-10-31 LAB — URIC ACID: Uric Acid, Serum: 5.6 mg/dL (ref 2.5–7.0)

## 2015-10-31 LAB — SEDIMENTATION RATE: SED RATE: 45 mm/h — AB (ref 0–20)

## 2015-10-31 LAB — SJOGRENS SYNDROME-A EXTRACTABLE NUCLEAR ANTIBODY: SSA (Ro) (ENA) Antibody, IgG: <1

## 2015-10-31 MED ORDER — VITAMIN D (ERGOCALCIFEROL) 1.25 MG (50000 UNIT) PO CAPS: 50000.0000 [IU] | ORAL_CAPSULE | ORAL | Status: DC

## 2015-10-31 NOTE — Addendum Note (Signed)
Addended by: Dessa PhiFUNCHES, Amiee Wiley on: 10/31/2015 08:13 AM   Modules accepted: Orders

## 2015-10-31 NOTE — Addendum Note (Signed)
Addended by: Dessa PhiFUNCHES, Domingos Riggi on: 10/31/2015 01:43 PM   Modules accepted: Orders

## 2015-11-03 ENCOUNTER — Telehealth: Payer: Self-pay | Admitting: *Deleted

## 2015-11-03 ENCOUNTER — Ambulatory Visit: Payer: No Typology Code available for payment source | Attending: Family Medicine

## 2015-11-03 DIAGNOSIS — M2550 Pain in unspecified joint: Secondary | ICD-10-CM

## 2015-11-03 DIAGNOSIS — M353 Polymyalgia rheumatica: Secondary | ICD-10-CM | POA: Insufficient documentation

## 2015-11-03 LAB — CK: CK TOTAL: 105 U/L (ref 7–177)

## 2015-11-03 NOTE — Telephone Encounter (Signed)
-----   Message from Dessa PhiJosalyn Funches, MD sent at 10/31/2015  8:13 AM EDT ----- Vit B12 is normal Vit D is still quite low, replace vit D for 12 weeks

## 2015-11-03 NOTE — Patient Instructions (Signed)
Patient is aware of receiving a FU call regarding results. 

## 2015-11-03 NOTE — Telephone Encounter (Signed)
Date of birth verified by pt  Lab results given to pt  Notified Rx vit D send to [facility] pharmacy  Pt will stopped by today for blood work   Pt Stated Inj given on last visit (Toradol)did not help

## 2015-11-03 NOTE — Telephone Encounter (Signed)
-----   Message from Dessa PhiJosalyn Funches, MD sent at 10/31/2015  1:42 PM EDT ----- Elevated sed rate and CRP (inflammatory markers)  All other markers negative, there is no autoimmune arthritis  Patient asked to return for CK to check for myositis,

## 2015-11-28 ENCOUNTER — Ambulatory Visit: Payer: Self-pay | Admitting: Neurology

## 2015-12-06 ENCOUNTER — Ambulatory Visit (HOSPITAL_COMMUNITY)
Admission: EM | Admit: 2015-12-06 | Discharge: 2015-12-06 | Disposition: A | Discharge status: Home / Self Care | Payer: No Typology Code available for payment source | Attending: Family Medicine | Admitting: Family Medicine

## 2015-12-06 ENCOUNTER — Encounter (HOSPITAL_COMMUNITY): Payer: Self-pay | Admitting: Emergency Medicine

## 2015-12-06 DIAGNOSIS — M7989 Other specified soft tissue disorders: Secondary | ICD-10-CM

## 2015-12-06 MED ORDER — TRAMADOL HCL 50 MG PO TABS: 50.0000 mg | ORAL_TABLET | Freq: Four times a day (QID) | ORAL | Status: DC | PRN

## 2015-12-06 MED ORDER — FUROSEMIDE 40 MG PO TABS: 40.0000 mg | ORAL_TABLET | Freq: Every day | ORAL | Status: DC

## 2015-12-06 NOTE — ED Notes (Signed)
PT has bilateral pitting edema in lower extremities for 3 weeks. PT reports they throb constantly and keep her up at night.

## 2015-12-06 NOTE — Discharge Instructions (Signed)
Edema °Edema is an abnormal buildup of fluids. It is more common in your legs and thighs. Painless swelling of the feet and ankles is more likely as a person ages. It also is common in looser skin, like around your eyes. °HOME CARE  °· Keep the affected body part above the level of the heart while lying down. °· Do not sit still or stand for a long time. °· Do not put anything right under your knees when you lie down. °· Do not wear tight clothes on your upper legs. °· Exercise your legs to help the puffiness (swelling) go down. °· Wear elastic bandages or support stockings as told by your doctor. °· A low-salt diet may help lessen the puffiness. °· Only take medicine as told by your doctor. °GET HELP IF: °· Treatment is not working. °· You have heart, liver, or kidney disease and notice that your skin looks puffy or shiny. °· You have puffiness in your legs that does not get better when you raise your legs. °· You have sudden weight gain for no reason. °GET HELP RIGHT AWAY IF:  °· You have shortness of breath or chest pain. °· You cannot breathe when you lie down. °· You have pain, redness, or warmth in the areas that are puffy. °· You have heart, liver, or kidney disease and get edema all of a sudden. °· You have a fever and your symptoms get worse all of a sudden. °MAKE SURE YOU:  °· Understand these instructions. °· Will watch your condition. °· Will get help right away if you are not doing well or get worse. °  °This information is not intended to replace advice given to you by your health care provider. Make sure you discuss any questions you have with your health care provider. °  °Document Released: 10/20/2007 Document Revised: 05/08/2013 Document Reviewed: 02/23/2013 °Elsevier Interactive Patient Education ©2016 [employer] ° °

## 2015-12-07 NOTE — ED Provider Notes (Signed)
CSN: [id number]     Arrival date & time 12/06/15  1522 History   First MD Initiated Contact with Patient 12/06/15 1611     Chief Complaint  Patient presents with  . Leg Swelling   (Consider location/radiation/quality/duration/timing/severity/associated sxs/prior Treatment) HPI PT WITH BILATERAL FEET AND ANKLE SWELLING. SYMPTOMS WORSE DURING THE DAY, DENIES ANY CHANGE IN DIET. WORKING MORE. NO SOB OR CP.  Past Medical History:  Diagnosis Date  . Acid reflux 2004  . Anxiety 2008  . Asthma 1993   no previous intubations or hospitalizations   . Chlamydia   . Depression 2008   no meds, currenly ok  . Environmental allergies   . Migraine   . Migraines   . Miscarriage   . Ovarian cyst   . Reflux   . Ulcer of the stomach and intestine 2004  . Urinary tract infection    Past Surgical History:  Procedure Laterality Date  . COLONOSCOPY  2004   . MULTIPLE TOOTH EXTRACTIONS  2015    3 teeth removed   . WISDOM TOOTH EXTRACTION  2007   Family History  Problem Relation Age of Onset  . Hypotension Neg Hx   . Anesthesia problems Neg Hx   . Malignant hyperthermia Neg Hx   . Pseudochol deficiency Neg Hx   . Cancer Neg Hx   . Asthma Mother   . Heart disease Mother   . Diabetes Mother   . Hypertension Mother   . Asthma Maternal Aunt   . Hypertension Maternal Aunt   . Diabetes Maternal Aunt   . Asthma Maternal Uncle   . Hypertension Maternal Uncle   . Diabetes Maternal Uncle   . Heart disease Maternal Grandmother   . Diabetes Maternal Grandmother   . Hypertension Maternal Grandmother   . Heart disease Maternal Grandfather   . Diabetes Maternal Grandfather   . Hypertension Maternal Grandfather   . Hypertension Father    Social History  Substance Use Topics  . Smoking status: Never Smoker  . Smokeless tobacco: Never Used  . Alcohol use No   OB History    Gravida Para Term Preterm AB Living   5 3 3  0 2 3   SAB TAB Ectopic Multiple Live Births   2 0 0 0       Review of  Systems  Denies: HEADACHE, NAUSEA, ABDOMINAL PAIN, CHEST PAIN, CONGESTION, DYSURIA, SHORTNESS OF BREATH  Allergies  Other; Latex; and Zofran  Home Medications   Prior to Admission medications   Medication Sig Start Date End Date Taking? Authorizing Provider  amitriptyline (ELAVIL) 50 MG tablet Take 1 tablet (50 mg total) by mouth at bedtime. 10/30/15  Yes Josalyn Funches, MD  buPROPion (WELLBUTRIN XL) 150 MG 24 hr tablet Take 1 tablet (150 mg total) by mouth daily. 01/21/15  Yes Josalyn Funches, MD  cyclobenzaprine (FLEXERIL) 10 MG tablet Take 1 tablet (10 mg total) by mouth 3 (three) times daily as needed for muscle spasms. 10/30/15  Yes Josalyn Funches, MD  gabapentin (NEURONTIN) 300 MG capsule Take 2 capsules (600 mg total) by mouth 3 (three) times daily. 08/15/15  Yes Glendale Chard, DO  ibuprofen (ADVIL,MOTRIN) 600 MG tablet  06/11/15  Yes Historical Provider, MD  naproxen (NAPROSYN) 500 MG tablet Take 1 tablet (500 mg total) by mouth 2 (two) times daily with a meal. 10/30/15  Yes Josalyn Funches, MD  topiramate (TOPAMAX) 25 MG tablet Take 1 tablet (25 mg total) by mouth daily. 09/12/15  Yes Donika K  Patel, DO  Vitamin D, Ergocalciferol, (DRISDOL) 50000 units CAPS capsule Take 1 capsule (50,000 Units total) by mouth every 7 (seven) days. For 12 weeks 10/31/15  Yes Dessa Phi, MD  acetaminophen (TYLENOL) 325 MG tablet Take 650 mg by mouth every 6 (six) hours as needed for mild pain. Reported on 10/30/2015    Historical Provider, MD  albuterol (PROVENTIL HFA;VENTOLIN HFA) 108 (90 Base) MCG/ACT inhaler Inhale 2 puffs into the lungs every 6 (six) hours as needed for wheezing or shortness of breath. 06/30/15   Josalyn Funches, MD  furosemide (LASIX) 40 MG tablet Take 1 tablet (40 mg total) by mouth daily. 12/06/15   Tharon Aquas, PA  traMADol (ULTRAM) 50 MG tablet Take 1 tablet (50 mg total) by mouth every 6 (six) hours as needed. 12/06/15   Tharon Aquas, PA   Meds Ordered and Administered this  Visit  Medications - No data to display  BP 119/83 (BP Location: Right Arm)   Pulse 89   Temp 98.2 F (36.8 C) (Oral)   Resp 16   LMP 12/01/2015   SpO2 100%  No data found.   Physical Exam NURSES NOTES AND VITAL SIGNS REVIEWED. CONSTITUTIONAL: Well developed, well nourished, no acute distress HEENT: normocephalic, atraumatic EYES: Conjunctiva normal NECK:normal ROM, supple, no adenopathy PULMONARY:No respiratory distress, normal effort ABDOMINAL: Soft, ND, NT BS+, No CVAT MUSCULOSKELETAL: Normal ROM of all extremities, BOTH FEET AND ANKLE HAVE 1-2 +PITTING EDEMA NO SIGNS OF INFECTION, NO CALF TENDERNESS.  SKIN: warm and dry without rash PSYCHIATRIC: Mood and affect, behavior are normal  Urgent Care Course   Clinical Course    Procedures (including critical care time)  Labs Review Labs Reviewed - No data to display  Imaging Review No results found.   Visual Acuity Review  Right Eye Distance:   Left Eye Distance:   Bilateral Distance:    Right Eye Near:   Left Eye Near:    Bilateral Near:        RX FOR LASIX PROVIDED.  MDM   1. Leg swelling     Patient is reassured that there are no issues that require transfer to higher level of care at this time or additional tests. Patient is advised to continue home symptomatic treatment. Patient is advised that if there are new or worsening symptoms to attend the emergency department, contact primary care provider, or return to UC. Instructions of care provided discharged home in stable condition.    THIS NOTE WAS GENERATED USING A VOICE RECOGNITION SOFTWARE PROGRAM. ALL REASONABLE EFFORTS  WERE MADE TO PROOFREAD THIS DOCUMENT FOR ACCURACY.  I have verbally reviewed the discharge instructions with the patient. A printed AVS was given to the patient.  All questions were answered prior to discharge.      Tharon Aquas, PA 12/07/15 1013

## 2015-12-09 ENCOUNTER — Ambulatory Visit (HOSPITAL_COMMUNITY)
Admission: EM | Admit: 2015-12-09 | Discharge: 2015-12-09 | Disposition: A | Discharge status: Home / Self Care | Payer: Medicaid Other | Attending: Physician Assistant | Admitting: Physician Assistant

## 2015-12-09 ENCOUNTER — Encounter (HOSPITAL_COMMUNITY): Payer: Self-pay | Admitting: Emergency Medicine

## 2015-12-09 DIAGNOSIS — Z0000 Encounter for general adult medical examination without abnormal findings: Secondary | ICD-10-CM | POA: Insufficient documentation

## 2015-12-09 DIAGNOSIS — N898 Other specified noninflammatory disorders of vagina: Secondary | ICD-10-CM

## 2015-12-09 LAB — POCT URINALYSIS DIP (DEVICE)
Bilirubin Urine: NEGATIVE
GLUCOSE, UA: NEGATIVE mg/dL
Ketones, ur: NEGATIVE mg/dL
LEUKOCYTES UA: NEGATIVE
Nitrite: NEGATIVE
Protein, ur: NEGATIVE mg/dL
UROBILINOGEN UA: 1 mg/dL (ref 0.0–1.0)
pH: 5.5 (ref 5.0–8.0)

## 2015-12-09 LAB — POCT PREGNANCY, URINE: PREG TEST UR: NEGATIVE

## 2015-12-09 MED ORDER — FLUCONAZOLE 200 MG PO TABS: 200.0000 mg | ORAL_TABLET | Freq: Every day | ORAL | 0 refills | Status: DC

## 2015-12-09 NOTE — ED Triage Notes (Signed)
The patient presented to the [facility] with a complaint of a vaginal discharge x 4 days. The patient denied any dysuria.

## 2015-12-10 ENCOUNTER — Encounter (HOSPITAL_COMMUNITY): Payer: Self-pay | Admitting: *Deleted

## 2015-12-10 ENCOUNTER — Inpatient Hospital Stay (HOSPITAL_COMMUNITY)
Admission: AD | Admit: 2015-12-10 | Discharge: 2015-12-10 | Disposition: A | Discharge status: Home / Self Care | Payer: Medicaid Other | Source: Ambulatory Visit | Attending: Obstetrics and Gynecology | Admitting: Obstetrics and Gynecology

## 2015-12-10 DIAGNOSIS — Z79899 Other long term (current) drug therapy: Secondary | ICD-10-CM | POA: Insufficient documentation

## 2015-12-10 DIAGNOSIS — N83209 Unspecified ovarian cyst, unspecified side: Secondary | ICD-10-CM | POA: Insufficient documentation

## 2015-12-10 DIAGNOSIS — J45909 Unspecified asthma, uncomplicated: Secondary | ICD-10-CM | POA: Insufficient documentation

## 2015-12-10 DIAGNOSIS — K219 Gastro-esophageal reflux disease without esophagitis: Secondary | ICD-10-CM | POA: Diagnosis not present

## 2015-12-10 DIAGNOSIS — F419 Anxiety disorder, unspecified: Secondary | ICD-10-CM | POA: Diagnosis not present

## 2015-12-10 DIAGNOSIS — Z888 Allergy status to other drugs, medicaments and biological substances status: Secondary | ICD-10-CM | POA: Diagnosis not present

## 2015-12-10 DIAGNOSIS — N760 Acute vaginitis: Secondary | ICD-10-CM

## 2015-12-10 DIAGNOSIS — Z9889 Other specified postprocedural states: Secondary | ICD-10-CM | POA: Diagnosis not present

## 2015-12-10 DIAGNOSIS — F329 Major depressive disorder, single episode, unspecified: Secondary | ICD-10-CM | POA: Insufficient documentation

## 2015-12-10 DIAGNOSIS — A499 Bacterial infection, unspecified: Secondary | ICD-10-CM

## 2015-12-10 DIAGNOSIS — B9689 Other specified bacterial agents as the cause of diseases classified elsewhere: Secondary | ICD-10-CM

## 2015-12-10 DIAGNOSIS — G43909 Migraine, unspecified, not intractable, without status migrainosus: Secondary | ICD-10-CM | POA: Diagnosis not present

## 2015-12-10 LAB — URINE MICROSCOPIC-ADD ON

## 2015-12-10 LAB — WET PREP, GENITAL
Sperm: NONE SEEN
Trich, Wet Prep: NONE SEEN
YEAST WET PREP: NONE SEEN

## 2015-12-10 LAB — URINE CYTOLOGY ANCILLARY ONLY
Chlamydia: NEGATIVE
NEISSERIA GONORRHEA: NEGATIVE
TRICH (WINDOWPATH): NEGATIVE

## 2015-12-10 LAB — URINALYSIS, ROUTINE W REFLEX MICROSCOPIC
Bilirubin Urine: NEGATIVE
GLUCOSE, UA: NEGATIVE mg/dL
KETONES UR: NEGATIVE mg/dL
LEUKOCYTES UA: NEGATIVE
NITRITE: NEGATIVE
PH: 5.5 (ref 5.0–8.0)
Protein, ur: NEGATIVE mg/dL
SPECIFIC GRAVITY, URINE: 1.025 (ref 1.005–1.030)

## 2015-12-10 MED ORDER — METRONIDAZOLE 500 MG PO TABS: 500.0000 mg | ORAL_TABLET | Freq: Two times a day (BID) | ORAL | 0 refills | Status: DC

## 2015-12-10 MED ORDER — FLUCONAZOLE 150 MG PO TABS: 150.0000 mg | ORAL_TABLET | Freq: Every day | ORAL | 1 refills | Status: DC

## 2015-12-10 NOTE — MAU Provider Note (Signed)
History     CSN: [id number]  Arrival date and time: 12/10/15 1456   First Provider Initiated Contact with Patient 12/10/15 1614      Chief Complaint  Patient presents with  . Vaginal Discharge  . Vaginal Itching   HPI   [name] is a 30 y.o. female [phone] here with vaginal discharge and vaginal itching X 2 days. She has not tried anything over the counter. She denies pain.   OB History    Gravida Para Term Preterm AB Living   5 3 3  0 2 3   SAB TAB Ectopic Multiple Live Births   2 0 0 0        Past Medical History:  Diagnosis Date  . Acid reflux 2004  . Anxiety 2008  . Asthma 1993   no previous intubations or hospitalizations   . Chlamydia   . Depression 2008   no meds, currenly ok  . Environmental allergies   . Migraine   . Migraines   . Miscarriage   . Ovarian cyst   . Reflux   . Ulcer of the stomach and intestine 2004  . Urinary tract infection     Past Surgical History:  Procedure Laterality Date  . COLONOSCOPY  2004   . MULTIPLE TOOTH EXTRACTIONS  2015    3 teeth removed   . WISDOM TOOTH EXTRACTION  2007    Family History  Problem Relation Age of Onset  . Asthma Mother   . Heart disease Mother   . Diabetes Mother   . Hypertension Mother   . Hypertension Father   . Asthma Maternal Aunt   . Hypertension Maternal Aunt   . Diabetes Maternal Aunt   . Asthma Maternal Uncle   . Hypertension Maternal Uncle   . Diabetes Maternal Uncle   . Heart disease Maternal Grandmother   . Diabetes Maternal Grandmother   . Hypertension Maternal Grandmother   . Heart disease Maternal Grandfather   . Diabetes Maternal Grandfather   . Hypertension Maternal Grandfather   . Hypotension Neg Hx   . Anesthesia problems Neg Hx   . Malignant hyperthermia Neg Hx   . Pseudochol deficiency Neg Hx   . Cancer Neg Hx     Social History  Substance Use Topics  . Smoking status: Never Smoker  . Smokeless tobacco: Never Used  . Alcohol use No     Allergies:  Allergies  Allergen Reactions  . Other Anaphylaxis    "20 different types of trees"  . Latex Hives and Itching  . Zofran Itching    Prescriptions Prior to Admission  Medication Sig Dispense Refill Last Dose  . acetaminophen (TYLENOL) 325 MG tablet Take 650 mg by mouth every 6 (six) hours as needed for mild pain. Reported on 10/30/2015   Past Month at Unknown time  . albuterol (PROVENTIL HFA;VENTOLIN HFA) 108 (90 Base) MCG/ACT inhaler Inhale 2 puffs into the lungs every 6 (six) hours as needed for wheezing or shortness of breath. 8 g 1 Past Month at Unknown time  . amitriptyline (ELAVIL) 50 MG tablet Take 1 tablet (50 mg total) by mouth at bedtime. 30 tablet 1 Past Month at Unknown time  . buPROPion (WELLBUTRIN XL) 150 MG 24 hr tablet Take 1 tablet (150 mg total) by mouth daily. 30 tablet 2 12/09/2015 at Unknown time  . cyclobenzaprine (FLEXERIL) 10 MG tablet Take 1 tablet (10 mg total) by mouth 3 (three) times daily as needed for muscle spasms. 30 tablet 0  Past Month at Unknown time  . fluconazole (DIFLUCAN) 200 MG tablet Take 1 tablet (200 mg total) by mouth daily. 7 tablet 0   . furosemide (LASIX) 40 MG tablet Take 1 tablet (40 mg total) by mouth daily. 30 tablet 0 12/09/2015 at Unknown time  . gabapentin (NEURONTIN) 300 MG capsule Take 2 capsules (600 mg total) by mouth 3 (three) times daily. 180 capsule 3 12/09/2015 at Unknown time  . ibuprofen (ADVIL,MOTRIN) 600 MG tablet   0 Past Month at Unknown time  . naproxen (NAPROSYN) 500 MG tablet Take 1 tablet (500 mg total) by mouth 2 (two) times daily with a meal. 60 tablet 0 Past Month at Unknown time  . topiramate (TOPAMAX) 25 MG tablet Take 1 tablet (25 mg total) by mouth daily. 30 tablet 5 12/09/2015 at Unknown time  . traMADol (ULTRAM) 50 MG tablet Take 1 tablet (50 mg total) by mouth every 6 (six) hours as needed. 15 tablet 0 Past Week at Unknown time  . Vitamin D, Ergocalciferol, (DRISDOL) 50000 units CAPS capsule Take 1  capsule (50,000 Units total) by mouth every 7 (seven) days. For 12 weeks 4 capsule 2 Past Week at Unknown time   Results for orders placed or performed during the hospital encounter of 12/10/15 (from the past 48 hour(s))  Urinalysis, Routine w reflex microscopic (not at [facility])     Status: Abnormal   Collection Time: 12/10/15  3:43 PM  Result Value Ref Range   Color, Urine YELLOW YELLOW   APPearance CLEAR CLEAR   Specific Gravity, Urine 1.025 1.005 - 1.030   pH 5.5 5.0 - 8.0   Glucose, UA NEGATIVE NEGATIVE mg/dL   Hgb urine dipstick TRACE (A) NEGATIVE   Bilirubin Urine NEGATIVE NEGATIVE   Ketones, ur NEGATIVE NEGATIVE mg/dL   Protein, ur NEGATIVE NEGATIVE mg/dL   Nitrite NEGATIVE NEGATIVE   Leukocytes, UA NEGATIVE NEGATIVE  Urine microscopic-add on     Status: Abnormal   Collection Time: 12/10/15  3:43 PM  Result Value Ref Range   Squamous Epithelial / LPF 0-5 (A) NONE SEEN   WBC, UA 0-5 0 - 5 WBC/hpf   RBC / HPF 0-5 0 - 5 RBC/hpf   Bacteria, UA MANY (A) NONE SEEN  Wet prep, genital     Status: Abnormal   Collection Time: 12/10/15  3:55 PM  Result Value Ref Range   Yeast Wet Prep HPF POC NONE SEEN NONE SEEN   Trich, Wet Prep NONE SEEN NONE SEEN   Clue Cells Wet Prep HPF POC PRESENT (A) NONE SEEN   WBC, Wet Prep HPF POC MODERATE (A) NONE SEEN    Comment: BACTERIA- TOO NUMEROUS TO COUNT   Sperm NONE SEEN     Review of Systems  Constitutional: Negative for chills and fever.  Gastrointestinal: Negative for abdominal pain, nausea and vomiting.  Genitourinary: Negative for dysuria, flank pain, frequency, hematuria and urgency.   Physical Exam   Blood pressure 128/71, pulse 86, temperature 98.1 F (36.7 C), temperature source Oral, resp. rate 18, weight 240 lb 12 oz (109.2 kg), last menstrual period 12/01/2015.  Physical Exam  Constitutional: She is oriented to person, place, and time. She appears well-developed and well-nourished. No distress.  Respiratory: Effort normal.   Genitourinary:  Genitourinary Comments: Wet prep and GC collected by RN.   Musculoskeletal: Normal range of motion.  Neurological: She is alert and oriented to person, place, and time.  Skin: Skin is warm. She is not diaphoretic.  Psychiatric: Her  behavior is normal.    MAU Course  Procedures  None  MDM  Wet prep GC  Assessment and Plan   A:  1. Bacterial vaginosis   2. Vaginitis and vulvovaginitis     P:  Discharge home in stable condition Rx: Flagyl- no alcohol        Diflucan  Return to MAU for emergencies  Follow up with GYN/PCP as needed   Duane Lope, NP 12/10/2015 7:11 PM

## 2015-12-10 NOTE — MAU Note (Signed)
Been having a lot of d/c and itching, since Friday.  Has been having "fluttering".  Not sure, has been having unprotected sex.  Has had period

## 2015-12-10 NOTE — MAU Note (Signed)
There are some "little bumps" down there too. Not sure if I wiped too hard but saw some blood yesterday

## 2015-12-10 NOTE — Discharge Instructions (Signed)

## 2015-12-11 LAB — GC/CHLAMYDIA PROBE AMP (~~LOC~~) NOT AT ARMC
Chlamydia: NEGATIVE
Neisseria Gonorrhea: NEGATIVE

## 2015-12-12 LAB — URINE CYTOLOGY ANCILLARY ONLY

## 2015-12-24 ENCOUNTER — Other Ambulatory Visit: Payer: Self-pay | Admitting: Family Medicine

## 2016-01-07 ENCOUNTER — Ambulatory Visit (INDEPENDENT_AMBULATORY_CARE_PROVIDER_SITE_OTHER): Payer: Medicaid Other | Admitting: Neurology

## 2016-01-07 ENCOUNTER — Encounter: Payer: Self-pay | Admitting: Neurology

## 2016-01-07 VITALS — BP 110/70 | HR 93 | Ht 62.0 in | Wt 242.2 lb

## 2016-01-07 DIAGNOSIS — R200 Anesthesia of skin: Secondary | ICD-10-CM | POA: Diagnosis not present

## 2016-01-07 DIAGNOSIS — M6289 Other specified disorders of muscle: Secondary | ICD-10-CM

## 2016-01-07 DIAGNOSIS — R202 Paresthesia of skin: Secondary | ICD-10-CM

## 2016-01-07 DIAGNOSIS — M79642 Pain in left hand: Secondary | ICD-10-CM

## 2016-01-07 DIAGNOSIS — R29898 Other symptoms and signs involving the musculoskeletal system: Secondary | ICD-10-CM

## 2016-01-07 MED ORDER — TOPIRAMATE 25 MG PO TABS: 25.0000 mg | ORAL_TABLET | Freq: Every day | ORAL | 11 refills | Status: DC

## 2016-01-07 MED ORDER — GABAPENTIN 300 MG PO CAPS: 600.0000 mg | ORAL_CAPSULE | Freq: Every day | ORAL | 3 refills | Status: DC

## 2016-01-07 NOTE — Progress Notes (Signed)
Follow-up Visit   Date: 01/07/16   Laurie Reed MRN: [id number] DOB: [birth year]   Interim History: Laurie Reed is a 30 y.o. right-handed African American female returning to the clinic for follow-up of left hand paresthesias.  The patient was accompanied to the clinic by self.  History of present illness: Starting around a year ago, she feels numbness and tingling over the fingers. She has a "pinching sensation" in her wrist. Symptoms are intermittent and worse with bending at the wrist, writing, and wakes her up from sleeping. She was recommended to use a wrist splint which does not help. She reports to dropping objects. She also complains of entire left arm numbness. She endorses neck pain. She has the same symptoms in the right hand, but not as severe.   She was given a course of prednisone for possible tendonitis but did not have any benefit. She was then given gabapentin and wrist splint for CTS without any relief.   UPDATE 09/12/2015:    Her NCS/EMG did not show any abnormalities to explain her left hand pain - no evidence of carpal tunnel syndrome or cervical radiculopathy.  She continues to have severe pain in the left hand, especially with hand movement and is wearing a wrist splint today.  She is starting to have greater difficulty with fine movement such as trying to braid her daughter's hair. She again says that her entire upper extremity become numb and cold which is when the pain becomes severe.  Arm position does not seem to trigger pain or paresthesias.  She complains of migraines for a number of years. Pain is holocephalic, occuring every day and lasting all day.  She has photophobia, phonophobia, nausea, and vomiting.  She has tried imitrex in the past which did not help.  She takes Aleve daily, but does not notice significant benefit.    UPDATE 01/07/2016:  She no longer has headaches since being on topiramate 22m daily. She continues to have episodic  left hand numbness and severe achy pain. There is no nerve injury on her NCS/EMG on the left side.  Her MRI cervical spine and arterial studies of the upper extremity was normal.  She is now had to ask her aunt to help braid her daughters hair because her hands achy so much with repetitive activity.   Medications:  Current Outpatient Prescriptions on File Prior to Visit  Medication Sig Dispense Refill  . acetaminophen (TYLENOL) 325 MG tablet Take 650 mg by mouth every 6 (six) hours as needed for mild pain. Reported on 10/30/2015    . albuterol (PROVENTIL HFA;VENTOLIN HFA) 108 (90 Base) MCG/ACT inhaler Inhale 2 puffs into the lungs every 6 (six) hours as needed for wheezing or shortness of breath. 8 g 1  . amitriptyline (ELAVIL) 50 MG tablet Take 1 tablet (50 mg total) by mouth at bedtime. 30 tablet 1  . buPROPion (WELLBUTRIN XL) 150 MG 24 hr tablet Take 1 tablet (150 mg total) by mouth daily. 30 tablet 2  . cyclobenzaprine (FLEXERIL) 10 MG tablet Take 1 tablet (10 mg total) by mouth 3 (three) times daily as needed for muscle spasms. 30 tablet 0  . fluconazole (DIFLUCAN) 150 MG tablet Take 1 tablet (150 mg total) by mouth daily. 1 tablet 1  . furosemide (LASIX) 40 MG tablet Take 1 tablet (40 mg total) by mouth daily. 30 tablet 0  . gabapentin (NEURONTIN) 300 MG capsule Take 2 capsules (600 mg total) by mouth 3 (three) times daily. 1[place]  capsule 3  . ibuprofen (ADVIL,MOTRIN) 600 MG tablet   0  . metroNIDAZOLE (FLAGYL) 500 MG tablet Take 1 tablet (500 mg total) by mouth 2 (two) times daily. 14 tablet 0  . naproxen (NAPROSYN) 500 MG tablet Take 1 tablet (500 mg total) by mouth 2 (two) times daily with a meal. 60 tablet 0  . topiramate (TOPAMAX) 25 MG tablet Take 1 tablet (25 mg total) by mouth daily. 30 tablet 5  . traMADol (ULTRAM) 50 MG tablet Take 1 tablet (50 mg total) by mouth every 6 (six) hours as needed. 15 tablet 0  . Vitamin D, Ergocalciferol, (DRISDOL) 50000 units CAPS capsule Take 1 capsule  (50,000 Units total) by mouth every 7 (seven) days. For 12 weeks 4 capsule 2  . [DISCONTINUED] omeprazole (PRILOSEC) 40 MG capsule Take 1 capsule (40 mg total) by mouth daily. (Patient not taking: Reported on 10/08/2014) 30 capsule 0  . [DISCONTINUED] propranolol (INDERAL) 20 MG tablet Take 2 tablets (40 mg total) by mouth 2 (two) times daily. (Patient not taking: Reported on 09/09/2014) 120 tablet 1   No current facility-administered medications on file prior to visit.     Allergies:  Allergies  Allergen Reactions  . Other Anaphylaxis    "20 different types of trees"  . Latex Hives and Itching  . Zofran Itching    Review of Systems:  CONSTITUTIONAL: No fevers, chills, night sweats, or weight loss.  EYES: No visual changes or eye pain ENT: No hearing changes.  No history of nose bleeds.   RESPIRATORY: No cough, wheezing and shortness of breath.   CARDIOVASCULAR: Negative for chest pain, and palpitations.   GI: Negative for abdominal discomfort, blood in stools or black stools.  No recent change in bowel habits.   GU:  No history of incontinence.   MUSCLOSKELETAL: +history of joint pain or swelling.  No myalgias.   SKIN: Negative for lesions, rash, and itching.   ENDOCRINE: Negative for cold or heat intolerance, polydipsia or goiter.   PSYCH:  + depression or anxiety symptoms.   NEURO: As Above.   Vital Signs:  BP 110/70   Pulse 93   Ht '5\' 2"'  (1.575 m)   Wt 242 lb 3 oz (109.9 kg)   SpO2 98%   BMI 44.30 kg/m   Neurological Exam: MENTAL STATUS including orientation to time, place, person, recent and remote memory, attention span and concentration, language, and fund of knowledge is normal.  Speech is not dysarthric.  CRANIAL NERVES:  Face is symmetric.  MOTOR:  Motor strength is 5/5 in all extremities, except left interosseus muscles and ABP is 4/5 ?pain limited weakness in the left hand.  No atrophy, fasciculations or abnormal movements.  No pronator drift.  Tone is normal.      MSRs:  Reflexes are 2+/4 throughout  SENSORY:  Intact to vibration and temperature.  COORDINATION/GAIT:    Gait narrow based and stable.   Data: NCS/EMG of the upper extremities 08/26/2015:  1. 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 Overall, these findings are very mild in degree electrically. 2. There is no evidence of a cervical radiculopathy affecting the upper extremities.  MRI cervical spine 09/25/2015:  Negative  Labs 11/03/2015:  ESR 45*, CK 105, dsDNA <1, CRP 2.0, CCP <16, SSA/B neg, <RF, ANA neg, vitamin B12 1065     IMPRESSION/PLAN: Ms. Kinnie Scales is a 30 year-old female returning for evaluation of left hand pain and  paresthesias.  She had had an extensive work-up including NCS/EMG, MRI cervical spine, arterial studies of the arms, and serology testing which has returned essentially normal.  Her ESR has been elevated but none of her autoimmune markers are abnormal. She described numbness and cold sensation over the entire left upper extremity which does not follow a nerve distribution. I have a low suspicion for anything intracranial causing her discomfort and it would certainly not cause her hand achy pain.  Nevertheless to be complete with her evaluation, MRI brain will be ordered to exclude anything structural (demyelinating disease).  If this returns normal, I may seek a second opinion with neurology at an academic center as there is little I have to offer.  For her pain, she can adjust gabapentin to 619m at bedtime only to minimize daytime somnolence.     Some of her hand pain may be due to tendonitis and I have asked her to this to her Sports Medicine provider.  For her headaches, she has responded well to topiramate 215mdaily which will be continued.   The duration of this appointment visit was 30 minutes of face-to-face time with the patient.  Greater than 50% of this time was spent in counseling,  explanation of diagnosis, planning of further management, and coordination of care.   Thank you for allowing me to participate in patient's care.  If I can answer any additional questions, I would be pleased to do so.    Sincerely,    Meriel Kelliher K. PaPosey ProntoDO

## 2016-01-07 NOTE — Patient Instructions (Addendum)
1.  MRI brain without contrast 2.  Increase gabapentin 600mg  at bedtime 3.  Continue topiramate 25mg  daily

## 2016-01-23 ENCOUNTER — Encounter: Payer: Medicaid Other | Attending: Physical Medicine & Rehabilitation | Admitting: Physical Medicine & Rehabilitation

## 2016-01-23 DIAGNOSIS — G8929 Other chronic pain: Secondary | ICD-10-CM | POA: Insufficient documentation

## 2016-01-23 DIAGNOSIS — Z9889 Other specified postprocedural states: Secondary | ICD-10-CM | POA: Insufficient documentation

## 2016-01-23 DIAGNOSIS — F419 Anxiety disorder, unspecified: Secondary | ICD-10-CM | POA: Insufficient documentation

## 2016-01-23 DIAGNOSIS — G43909 Migraine, unspecified, not intractable, without status migrainosus: Secondary | ICD-10-CM | POA: Insufficient documentation

## 2016-01-23 DIAGNOSIS — R269 Unspecified abnormalities of gait and mobility: Secondary | ICD-10-CM | POA: Insufficient documentation

## 2016-01-23 DIAGNOSIS — K219 Gastro-esophageal reflux disease without esophagitis: Secondary | ICD-10-CM | POA: Insufficient documentation

## 2016-01-23 DIAGNOSIS — F329 Major depressive disorder, single episode, unspecified: Secondary | ICD-10-CM | POA: Insufficient documentation

## 2016-01-23 DIAGNOSIS — M25561 Pain in right knee: Secondary | ICD-10-CM | POA: Insufficient documentation

## 2016-01-23 DIAGNOSIS — N83209 Unspecified ovarian cyst, unspecified side: Secondary | ICD-10-CM | POA: Insufficient documentation

## 2016-01-23 DIAGNOSIS — G479 Sleep disorder, unspecified: Secondary | ICD-10-CM | POA: Insufficient documentation

## 2016-01-23 DIAGNOSIS — J45909 Unspecified asthma, uncomplicated: Secondary | ICD-10-CM | POA: Insufficient documentation

## 2016-01-23 DIAGNOSIS — M791 Myalgia: Secondary | ICD-10-CM | POA: Insufficient documentation

## 2016-01-23 DIAGNOSIS — G5601 Carpal tunnel syndrome, right upper limb: Secondary | ICD-10-CM | POA: Insufficient documentation

## 2016-01-23 DIAGNOSIS — M545 Low back pain: Secondary | ICD-10-CM | POA: Insufficient documentation

## 2016-02-11 ENCOUNTER — Encounter (HOSPITAL_BASED_OUTPATIENT_CLINIC_OR_DEPARTMENT_OTHER): Payer: Medicaid Other | Admitting: Physical Medicine & Rehabilitation

## 2016-02-11 ENCOUNTER — Encounter: Payer: Self-pay | Admitting: Physical Medicine & Rehabilitation

## 2016-02-11 ENCOUNTER — Telehealth: Payer: Self-pay | Admitting: Family Medicine

## 2016-02-11 VITALS — BP 109/75 | HR 82 | Resp 14

## 2016-02-11 DIAGNOSIS — R269 Unspecified abnormalities of gait and mobility: Secondary | ICD-10-CM | POA: Diagnosis not present

## 2016-02-11 DIAGNOSIS — Z5181 Encounter for therapeutic drug level monitoring: Secondary | ICD-10-CM

## 2016-02-11 DIAGNOSIS — M791 Myalgia: Secondary | ICD-10-CM | POA: Diagnosis not present

## 2016-02-11 DIAGNOSIS — M549 Dorsalgia, unspecified: Secondary | ICD-10-CM | POA: Diagnosis not present

## 2016-02-11 DIAGNOSIS — M545 Low back pain: Secondary | ICD-10-CM | POA: Diagnosis not present

## 2016-02-11 DIAGNOSIS — Z79899 Other long term (current) drug therapy: Secondary | ICD-10-CM

## 2016-02-11 DIAGNOSIS — G479 Sleep disorder, unspecified: Secondary | ICD-10-CM

## 2016-02-11 DIAGNOSIS — IMO0001 Reserved for inherently not codable concepts without codable children: Secondary | ICD-10-CM

## 2016-02-11 DIAGNOSIS — G8929 Other chronic pain: Secondary | ICD-10-CM

## 2016-02-11 DIAGNOSIS — G894 Chronic pain syndrome: Secondary | ICD-10-CM

## 2016-02-11 DIAGNOSIS — M25561 Pain in right knee: Secondary | ICD-10-CM

## 2016-02-11 DIAGNOSIS — G43909 Migraine, unspecified, not intractable, without status migrainosus: Secondary | ICD-10-CM | POA: Diagnosis not present

## 2016-02-11 DIAGNOSIS — J45909 Unspecified asthma, uncomplicated: Secondary | ICD-10-CM | POA: Diagnosis not present

## 2016-02-11 DIAGNOSIS — F329 Major depressive disorder, single episode, unspecified: Secondary | ICD-10-CM | POA: Diagnosis not present

## 2016-02-11 DIAGNOSIS — Z9889 Other specified postprocedural states: Secondary | ICD-10-CM | POA: Diagnosis not present

## 2016-02-11 DIAGNOSIS — K219 Gastro-esophageal reflux disease without esophagitis: Secondary | ICD-10-CM | POA: Diagnosis not present

## 2016-02-11 DIAGNOSIS — N83209 Unspecified ovarian cyst, unspecified side: Secondary | ICD-10-CM | POA: Diagnosis not present

## 2016-02-11 DIAGNOSIS — F419 Anxiety disorder, unspecified: Secondary | ICD-10-CM | POA: Diagnosis not present

## 2016-02-11 DIAGNOSIS — M609 Myositis, unspecified: Secondary | ICD-10-CM

## 2016-02-11 DIAGNOSIS — G5601 Carpal tunnel syndrome, right upper limb: Secondary | ICD-10-CM | POA: Diagnosis not present

## 2016-02-11 MED ORDER — AMITRIPTYLINE HCL 150 MG PO TABS: 75.0000 mg | ORAL_TABLET | Freq: Every day | ORAL | 1 refills | Status: DC

## 2016-02-11 MED ORDER — DULOXETINE HCL 30 MG PO CPEP: 30.0000 mg | ORAL_CAPSULE | Freq: Every day | ORAL | 1 refills | Status: DC

## 2016-02-11 MED ORDER — DICLOFENAC SODIUM 1 % TD GEL: 2.0000 g | Freq: Four times a day (QID) | TRANSDERMAL | 1 refills | Status: DC

## 2016-02-11 MED ORDER — TRAMADOL HCL 50 MG PO TABS: 100.0000 mg | ORAL_TABLET | Freq: Four times a day (QID) | ORAL | 0 refills | Status: DC | PRN

## 2016-02-11 NOTE — Progress Notes (Signed)
Subjective:    Patient ID: Laurie Reed, female    DOB: June 20, 1985, 30 y.o.   MRN: [id number]  HPI 30 y/o female with pmh of migraines, anxiety, depression, asthma, chornic back pain, right knee pain,m right CTS presents mainly with back pain.  She states she has "always" had back pain, clarifies to start in 2013.  No inciting event.  She used to lift heavy objects.  Midline back pain.  Rest improves the pain.  Movement exacerbates the pain.  Sharp and achy.  Non-radiating.  It is intermittent.  ?Getting progressively worse.  Denies associated numbness, tingling, weakness in legs.  She has tried tramadol (no benefit), naproxen (no benefit), tylenol 3 (no benefit).  Denies falls.  Pain limits her from going upstairs to her apartment and taking care of her kids.    Pain Inventory Average Pain 9 Pain Right Now 10 My pain is constant, sharp, dull and aching  In the last 24 hours, has pain interfered with the following? General activity 9 Relation with others 8 Enjoyment of life 10 What TIME of day is your pain at its worst? daytime, evening  Sleep (in general) Poor  Pain is worse with: walking, bending, standing and some activites Pain improves with: rest and medication Relief from Meds: 4  Mobility walk without assistance ability to climb steps?  no do you drive?  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  no CT/MRI nerve study   Family History  Problem Relation Age of Onset  . Asthma Mother   . Heart disease Mother   . Diabetes Mother   . Hypertension Mother   . Hypertension Father   . Asthma Maternal Aunt   . Hypertension Maternal Aunt   . Diabetes Maternal Aunt   . Asthma Maternal Uncle   . Hypertension Maternal Uncle   .  Diabetes Maternal Uncle   . Heart disease Maternal Grandmother   . Diabetes Maternal Grandmother   . Hypertension Maternal Grandmother   . Heart disease Maternal Grandfather   . Diabetes Maternal Grandfather   . Hypertension Maternal Grandfather   . Hypotension Neg Hx   . Anesthesia problems Neg Hx   . Malignant hyperthermia Neg Hx   . Pseudochol deficiency Neg Hx   . Cancer Neg Hx    Social History   Social History  . Marital status: Divorced    Spouse name: N/A  . Number of children: 3  . Years of education: 12    Occupational History  .  [employer]   Social History Main Topics  . Smoking status: Never Smoker  . Smokeless tobacco: Never Used  . Alcohol use No  . Drug use: No  . Sexual activity: Not Currently    Birth control/ protection: None     Comment: not since last MAU visit   Other Topics Concern  . None   Social History Narrative   Lives with 3 children.   Immediate family in [place].    Born in [place].   Raised in [place], [place].    Lives in a 2 story home.   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 COLONOSCOPY  2004   .  MULTIPLE TOOTH EXTRACTIONS  2015    3 teeth removed   . WISDOM TOOTH EXTRACTION  2007   Past Medical History:  Diagnosis Date  . Acid reflux 2004  . Anxiety 2008  . Asthma 1993   no previous intubations or hospitalizations   . Chlamydia   . Depression 2008   no meds, currenly ok  . Environmental allergies   . Migraine   . Migraines   . Miscarriage   . Ovarian cyst   . Reflux   . Ulcer of the stomach and intestine 2004  . Urinary tract infection    BP 109/75   Pulse 82   Resp 14   SpO2 98%   Opioid Risk Score:   Fall Risk Score:  `1  Depression screen PHQ 2/9  [facility] 02/11/2016 10/30/2015 09/17/2015 06/30/2015 01/21/2015 01/07/2015 01/07/2015  Decreased Interest 3 3 0 3 3 0 0  Down, Depressed, Hopeless 2 2 0 2 2 0 0  PHQ  - 2 Score 5 5 0 5 5 0 0  Altered sleeping 3 2 - 3 3 - -  Tired, decreased energy 3 2 - 3 3 - -  Change in appetite 3 3 - 3 3 - -  Feeling bad or failure about yourself  1 0 - 1 1 - -  Trouble concentrating 2 1 - 3 3 - -  Moving slowly or fidgety/restless 2 3 - 2 3 - -  Suicidal thoughts 0 0 - 0 0 - -  PHQ-9 Score 19 16 - 20 21 - -  Difficult doing work/chores Very difficult - - - - - -    Review of Systems  Constitutional: Positive for diaphoresis and unexpected weight change.  HENT: Negative.   Eyes: Negative.   Respiratory: Positive for shortness of breath and wheezing.   Cardiovascular: Negative.   Gastrointestinal: Negative.   Endocrine: Negative.   Genitourinary: Positive for difficulty urinating.  Musculoskeletal: Positive for back pain.       Spasms  Skin: Negative.   Allergic/Immunologic: Negative.   Neurological: Positive for numbness.  Hematological: Negative.   Psychiatric/Behavioral: Positive for dysphoric mood. The patient is nervous/anxious.   All other systems reviewed and are negative.     Objective:   Physical Exam Gen: NAD. Vital signs reviewed. Obese. HENT: Normocephalic, Atraumatic Eyes: EOMI, Conj WNL Cardio: S1, S2 normal, RRR Pulm: B/l clear to auscultation.  Effort normal Abd: Soft, non-distended, non-tender, BS+ MSK:  Gait antalgic.   TTP along lower thoracic, lumbar PSP.    No edema.   FABERs neg on left, limited on right due to knee pain.  Right knee TTP with edema. Neuro: CN II-XII grossly intact.    Sensation intact to light touch in all LE dermatomes  Reflexes 2+ throughout  Strength  4+/5 in all LLE myotomes (pin inhibition)    4/5 in all RLE myotomes (pin inhibition)  Neg SLR b/l Skin: Warm and Dry    Assessment & Plan:  30 y/o female with pmh of migraines, anxiety, depression, asthma, chornic back pain, right knee pain,m right CTS presents mainly with back pain  1. Chronic mechanical low back pain  Ice/heat only provides temp  relief  Will order xray of back  Will consider PT based on results of xray for eval and treat of low back pain with ROM, stretching, and TENs eval  Will increase tramadol to 100 q6 PRN (educated on signs/symptoms of serotonin syndrome), will wean in future due  to risk of seizures - no personal history of family history  Will start Cymbalta 30mg , will increase to 60mg  on next visit if tolerated  Will refer to psychology   UDS performed  2. Right knee pain  Will order xray of knee (last xray reviewed, 01/2015 - unremarkable)  Cont icing, elevation  Voltaren gel ordered  3. Myalgias  Will consider trigger point injections in future  4. Morbid obesity  Will refer to dietitian  5. Sleep disturbance  Will increase elavil to 75  6. Abnormality of gait   Will order cane for safety   Trigger point injection procedure note: Trigger Point Injection: Written consent was obtained for the patient. Trigger points were identified of lower thoracic and upper lumbar paraspinals muscles. The areas were cleaned with alcohol, and each of  these trigger points were injected with 1 cc of 0.5% Marcaine (x5). Needle draw back was performed. There were no complications from the procedure, and it was well tolerated.

## 2016-02-11 NOTE — Addendum Note (Signed)
Addended by: Angela NevinWESSLING, Comfort Iversen D on: 02/11/2016 10:56 AM   Modules accepted: Orders

## 2016-02-11 NOTE — Telephone Encounter (Signed)
Patient is needing a letter stating her medical conditions for housing. Please follow up.

## 2016-02-13 NOTE — Telephone Encounter (Signed)
Please inform patient that letter and form ready for pick up

## 2016-02-13 NOTE — Telephone Encounter (Signed)
Pt is needing a letter for a 1st floor apartment due to her back and leg pain, pt also needs a handicap sticker I will fill out form and place in mailbox for you to sign.

## 2016-02-13 NOTE — Telephone Encounter (Signed)
Advised request is ready for pick-up. Pollyann KennedyKim Vinnie Gombert, RN, BSN

## 2016-02-17 ENCOUNTER — Ambulatory Visit: Payer: Medicaid Other | Attending: Family Medicine | Admitting: Family Medicine

## 2016-02-17 VITALS — BP 96/68 | HR 94 | Temp 98.6°F | Ht 62.0 in | Wt 242.6 lb

## 2016-02-17 DIAGNOSIS — M25561 Pain in right knee: Secondary | ICD-10-CM | POA: Diagnosis not present

## 2016-02-17 DIAGNOSIS — G8929 Other chronic pain: Secondary | ICD-10-CM | POA: Diagnosis not present

## 2016-02-17 DIAGNOSIS — J45909 Unspecified asthma, uncomplicated: Secondary | ICD-10-CM | POA: Diagnosis present

## 2016-02-17 DIAGNOSIS — Z79899 Other long term (current) drug therapy: Secondary | ICD-10-CM | POA: Diagnosis not present

## 2016-02-17 DIAGNOSIS — M7989 Other specified soft tissue disorders: Secondary | ICD-10-CM | POA: Diagnosis not present

## 2016-02-17 LAB — CBC
HEMATOCRIT: 38.2 % (ref 35.0–45.0)
HEMOGLOBIN: 12.2 g/dL (ref 11.7–15.5)
MCH: 25.8 pg — ABNORMAL LOW (ref 27.0–33.0)
MCHC: 31.9 g/dL — ABNORMAL LOW (ref 32.0–36.0)
MCV: 80.8 fL (ref 80.0–100.0)
MPV: 9.4 fL (ref 7.5–12.5)
Platelets: 382 10*3/uL (ref 140–400)
RBC: 4.73 MIL/uL (ref 3.80–5.10)
RDW: 14.5 % (ref 11.0–15.0)
WBC: 9.9 10*3/uL (ref 3.8–10.8)

## 2016-02-17 MED ORDER — METHYLPREDNISOLONE ACETATE 40 MG/ML IJ SUSP
40.0000 mg | Freq: Once | INTRAMUSCULAR | Status: AC
  Administered 2016-02-17: 40 mg via INTRAMUSCULAR

## 2016-02-17 MED ORDER — MEDICAL COMPRESSION SOCKS MISC: 1.0000 | Freq: Every day | 0 refills | Status: DC

## 2016-02-17 NOTE — Progress Notes (Signed)
Subjective:  Patient ID: Laurie Reed, female    DOB: 05-26-1985  Age: 30 y.o. MRN: [id number]  CC: Knee Pain   HPI Chavy Ericka PontiffMontgomery has asthma, morbid obesity, chronic pain in hands, back, knees R >L   presents for   1. R knee pain: worsening pain in R knee with some swelling. No skin discoloration. She has a normal R knee x-ray in 01/2015. She has been evaluated by pain management who has ordered a repeat R knee x-ray.   2. Feet swelling: both feet swelling with prolonged standing. She does not have HTN, has heavy menses. No skin discoloration. She does not wear compression stockings.   Social History  Substance Use Topics  . Smoking status: Never Smoker  . Smokeless tobacco: Never Used  . Alcohol use No    Outpatient Medications Prior to Visit  Medication Sig Dispense Refill  . acetaminophen (TYLENOL) 325 MG tablet Take 650 mg by mouth every 6 (six) hours as needed for mild pain. Reported on 10/30/2015    . albuterol (ACCUNEB) 0.63 MG/3ML nebulizer solution Inhale 1 ampule by nebulization every six (6) hours as needed for wheezing.    Marland Kitchen. albuterol (PROVENTIL HFA;VENTOLIN HFA) 108 (90 Base) MCG/ACT inhaler Inhale 2 puffs into the lungs every 6 (six) hours as needed for wheezing or shortness of breath. 8 g 1  . amitriptyline (ELAVIL) 150 MG tablet Take 0.5 tablets (75 mg total) by mouth at bedtime. 15 tablet 1  . diclofenac sodium (VOLTAREN) 1 % GEL Apply 2 g topically 4 (four) times daily. 1 Tube 1  . DULoxetine (CYMBALTA) 30 MG capsule Take 1 capsule (30 mg total) by mouth daily. 30 capsule 1  . fluconazole (DIFLUCAN) 150 MG tablet Take 1 tablet (150 mg total) by mouth daily. 1 tablet 1  . fluticasone (FLONASE) 50 MCG/ACT nasal spray 1 spray by Each Nare route daily.    . furosemide (LASIX) 40 MG tablet Take 1 tablet (40 mg total) by mouth daily. 30 tablet 0  . gabapentin (NEURONTIN) 300 MG capsule Take 2 capsules (600 mg total) by mouth at bedtime. 180 capsule 3  . ibuprofen  (ADVIL,MOTRIN) 600 MG tablet   0  . naproxen (NAPROSYN) 500 MG tablet Take 1 tablet (500 mg total) by mouth 2 (two) times daily with a meal. 60 tablet 0  . oxyCODONE-acetaminophen (PERCOCET/ROXICET) 5-325 MG tablet Take by mouth.    . topiramate (TOPAMAX) 25 MG tablet Take 1 tablet (25 mg total) by mouth daily. 30 tablet 11  . traMADol (ULTRAM) 50 MG tablet Take 2 tablets (100 mg total) by mouth every 6 (six) hours as needed. 360 tablet 0  . Vitamin D, Ergocalciferol, (DRISDOL) 50000 units CAPS capsule Take 1 capsule (50,000 Units total) by mouth every 7 (seven) days. For 12 weeks 4 capsule 2   No facility-administered medications prior to visit.     ROS Review of Systems  Constitutional: Negative for chills and fever.  Eyes: Negative for visual disturbance.  Respiratory: Negative for shortness of breath.   Cardiovascular: Negative for chest pain.  Gastrointestinal: Negative for abdominal pain and blood in stool.  Musculoskeletal: Positive for arthralgias, back pain and myalgias. Negative for gait problem, joint swelling, neck pain and neck stiffness.  Skin: Negative for rash.  Allergic/Immunologic: Negative for immunocompromised state.  Hematological: Negative for adenopathy. Does not bruise/bleed easily.  Psychiatric/Behavioral: Positive for sleep disturbance. Negative for dysphoric mood and suicidal ideas. The patient is not nervous/anxious.     Objective:  BP 96/68 (BP Location: Right Arm, Patient Position: Sitting, Cuff Size: Large)   Pulse 94   Temp 98.6 F (37 C) (Oral)   Ht 5\' 2"  (1.575 m)   Wt 242 lb 9.6 oz (110 kg)   LMP 01/30/2016   SpO2 100%   BMI 44.37 kg/m   BP/Weight 02/17/2016 02/11/2016 01/07/2016  Systolic BP 96 109 110  Diastolic BP 68 75 70  Wt. (Lbs) 242.6 - 242.19  BMI 44.37 - 44.3    Physical Exam  Constitutional: She is oriented to person, place, and time. She appears well-developed and well-nourished. No distress.  Obese   HENT:  Head:  Normocephalic and atraumatic.  Cardiovascular: Normal rate, regular rhythm, normal heart sounds and intact distal pulses.   Pulmonary/Chest: Effort normal and breath sounds normal.  Musculoskeletal: She exhibits no edema.       Right knee: She exhibits effusion (mild ). Tenderness found. Patellar tendon tenderness noted.  Neurological: She is alert and oriented to person, place, and time.  Skin: Skin is warm and dry. No rash noted.  Psychiatric: She has a normal mood and affect.   After obtaining informed consent and cleaning the skin using iodine and alcohol a  steroid injection was performed at R knee.  Using 1:1 , 1 ml of  1% plain Lidocaine and 40 mg/ml of Depo Medrol. This was well tolerated.   Assessment & Plan:  Hali was seen today for knee pain.  Diagnoses and all orders for this visit:  Chronic pain of right knee -     methylPREDNISolone acetate (DEPO-MEDROL) injection 40 mg; Inject 1 mL (40 mg total) into the muscle once.  Foot swelling -     CBC -     Elastic Bandages & Supports (MEDICAL COMPRESSION SOCKS) MISC; 1 each by Does not apply route daily. Knee high compression stockings 25-30 mmHg pressure   There are no diagnoses linked to this encounter.  Meds ordered this encounter  Medications  . methylPREDNISolone acetate (DEPO-MEDROL) injection 40 mg  . Elastic Bandages & Supports (MEDICAL COMPRESSION SOCKS) MISC    Sig: 1 each by Does not apply route daily. Knee high compression stockings 25-30 mmHg pressure    Dispense:  2 each    Refill:  0    Follow-up: Return in about 4 weeks (around 03/16/2016) for pap smear .   Dessa Phi MD

## 2016-02-17 NOTE — Progress Notes (Signed)
Pt right knee is swollen.  Pt declined flu shot.

## 2016-02-17 NOTE — Patient Instructions (Addendum)
Laurie Reed was seen today for knee pain.  Diagnoses and all orders for this visit:  Chronic pain of right knee -     methylPREDNISolone acetate (DEPO-MEDROL) injection 40 mg; Inject 1 mL (40 mg total) into the muscle once.  Foot swelling -     CBC   You have received a shot of steroid in your joint today. Rest and ice knee today. Regular activity tomorrow. Look out for redness, swelling, fever,severe pain in joint and call if you experience these symptoms.   F/u in 4-6 weeks for pap smear   Dr. Armen PickupFunches

## 2016-02-18 ENCOUNTER — Encounter: Payer: Self-pay | Admitting: Family Medicine

## 2016-02-18 DIAGNOSIS — M7989 Other specified soft tissue disorders: Secondary | ICD-10-CM | POA: Insufficient documentation

## 2016-02-18 LAB — TOXASSURE SELECT,+ANTIDEPR,UR

## 2016-02-18 NOTE — Assessment & Plan Note (Signed)
Foot swelling Normotensive No anemia  Plan: Weight loss Elevation Compression stockings, Rx given

## 2016-02-18 NOTE — Assessment & Plan Note (Signed)
Chronic pain Steroid injection done today

## 2016-02-19 NOTE — Progress Notes (Signed)
Urine drug screen for this encounter is consistent for prescribed medications.   

## 2016-02-20 ENCOUNTER — Telehealth: Payer: Self-pay

## 2016-02-20 NOTE — Telephone Encounter (Signed)
Pt's MRI w/o contrast was denied by insurance. Attempted to reach pt and let her know. Left vm.

## 2016-02-20 NOTE — Telephone Encounter (Signed)
Noted  

## 2016-02-25 ENCOUNTER — Encounter: Payer: Medicaid Other | Attending: Physical Medicine & Rehabilitation | Admitting: Physical Medicine & Rehabilitation

## 2016-02-25 DIAGNOSIS — M791 Myalgia: Secondary | ICD-10-CM | POA: Insufficient documentation

## 2016-02-25 DIAGNOSIS — F329 Major depressive disorder, single episode, unspecified: Secondary | ICD-10-CM | POA: Insufficient documentation

## 2016-02-25 DIAGNOSIS — G479 Sleep disorder, unspecified: Secondary | ICD-10-CM | POA: Insufficient documentation

## 2016-02-25 DIAGNOSIS — F419 Anxiety disorder, unspecified: Secondary | ICD-10-CM | POA: Insufficient documentation

## 2016-02-25 DIAGNOSIS — N83209 Unspecified ovarian cyst, unspecified side: Secondary | ICD-10-CM | POA: Insufficient documentation

## 2016-02-25 DIAGNOSIS — K219 Gastro-esophageal reflux disease without esophagitis: Secondary | ICD-10-CM | POA: Insufficient documentation

## 2016-02-25 DIAGNOSIS — G5601 Carpal tunnel syndrome, right upper limb: Secondary | ICD-10-CM | POA: Insufficient documentation

## 2016-02-25 DIAGNOSIS — Z9889 Other specified postprocedural states: Secondary | ICD-10-CM | POA: Insufficient documentation

## 2016-02-25 DIAGNOSIS — M25561 Pain in right knee: Secondary | ICD-10-CM | POA: Insufficient documentation

## 2016-02-25 DIAGNOSIS — R269 Unspecified abnormalities of gait and mobility: Secondary | ICD-10-CM | POA: Insufficient documentation

## 2016-02-25 DIAGNOSIS — M545 Low back pain: Secondary | ICD-10-CM | POA: Insufficient documentation

## 2016-02-25 DIAGNOSIS — G43909 Migraine, unspecified, not intractable, without status migrainosus: Secondary | ICD-10-CM | POA: Insufficient documentation

## 2016-02-25 DIAGNOSIS — J45909 Unspecified asthma, uncomplicated: Secondary | ICD-10-CM | POA: Insufficient documentation

## 2016-03-24 ENCOUNTER — Encounter: Payer: Medicaid Other | Attending: Physical Medicine & Rehabilitation | Admitting: Physical Medicine & Rehabilitation

## 2016-03-24 DIAGNOSIS — F329 Major depressive disorder, single episode, unspecified: Secondary | ICD-10-CM | POA: Insufficient documentation

## 2016-03-24 DIAGNOSIS — Z9889 Other specified postprocedural states: Secondary | ICD-10-CM | POA: Insufficient documentation

## 2016-03-24 DIAGNOSIS — J45909 Unspecified asthma, uncomplicated: Secondary | ICD-10-CM | POA: Insufficient documentation

## 2016-03-24 DIAGNOSIS — G43909 Migraine, unspecified, not intractable, without status migrainosus: Secondary | ICD-10-CM | POA: Insufficient documentation

## 2016-03-24 DIAGNOSIS — R269 Unspecified abnormalities of gait and mobility: Secondary | ICD-10-CM | POA: Insufficient documentation

## 2016-03-24 DIAGNOSIS — M25561 Pain in right knee: Secondary | ICD-10-CM | POA: Insufficient documentation

## 2016-03-24 DIAGNOSIS — K219 Gastro-esophageal reflux disease without esophagitis: Secondary | ICD-10-CM | POA: Insufficient documentation

## 2016-03-24 DIAGNOSIS — N83209 Unspecified ovarian cyst, unspecified side: Secondary | ICD-10-CM | POA: Insufficient documentation

## 2016-03-24 DIAGNOSIS — G479 Sleep disorder, unspecified: Secondary | ICD-10-CM | POA: Insufficient documentation

## 2016-03-24 DIAGNOSIS — M791 Myalgia: Secondary | ICD-10-CM | POA: Insufficient documentation

## 2016-03-24 DIAGNOSIS — G5601 Carpal tunnel syndrome, right upper limb: Secondary | ICD-10-CM | POA: Insufficient documentation

## 2016-03-24 DIAGNOSIS — M545 Low back pain: Secondary | ICD-10-CM | POA: Insufficient documentation

## 2016-03-24 DIAGNOSIS — F419 Anxiety disorder, unspecified: Secondary | ICD-10-CM | POA: Insufficient documentation

## 2016-04-22 ENCOUNTER — Ambulatory Visit: Payer: Self-pay | Admitting: Family Medicine

## 2016-06-01 ENCOUNTER — Other Ambulatory Visit: Payer: Self-pay | Admitting: Pediatrics

## 2016-06-01 DIAGNOSIS — Z20828 Contact with and (suspected) exposure to other viral communicable diseases: Secondary | ICD-10-CM

## 2016-06-01 MED ORDER — OSELTAMIVIR PHOSPHATE 75 MG PO CAPS: 75.0000 mg | ORAL_CAPSULE | Freq: Every day | ORAL | 0 refills | Status: DC

## 2016-06-01 NOTE — Progress Notes (Signed)
tamiflu for flu prophylaxis (daughter with Flu A +) in asthmatic parent with 2 additional asthmatic children.

## 2016-06-02 ENCOUNTER — Emergency Department (HOSPITAL_COMMUNITY)
Admission: EM | Admit: 2016-06-02 | Discharge: 2016-06-02 | Disposition: A | Discharge status: Home / Self Care | Payer: Medicaid Other | Attending: Emergency Medicine | Admitting: Emergency Medicine

## 2016-06-02 ENCOUNTER — Encounter (HOSPITAL_COMMUNITY): Payer: Self-pay | Admitting: *Deleted

## 2016-06-02 DIAGNOSIS — R05 Cough: Secondary | ICD-10-CM | POA: Diagnosis present

## 2016-06-02 DIAGNOSIS — Z79899 Other long term (current) drug therapy: Secondary | ICD-10-CM | POA: Diagnosis not present

## 2016-06-02 DIAGNOSIS — Z20828 Contact with and (suspected) exposure to other viral communicable diseases: Secondary | ICD-10-CM

## 2016-06-02 DIAGNOSIS — Z91040 Latex allergy status: Secondary | ICD-10-CM | POA: Insufficient documentation

## 2016-06-02 DIAGNOSIS — J111 Influenza due to unidentified influenza virus with other respiratory manifestations: Secondary | ICD-10-CM | POA: Insufficient documentation

## 2016-06-02 DIAGNOSIS — J45909 Unspecified asthma, uncomplicated: Secondary | ICD-10-CM | POA: Diagnosis not present

## 2016-06-02 DIAGNOSIS — R69 Illness, unspecified: Secondary | ICD-10-CM

## 2016-06-02 MED ORDER — OSELTAMIVIR PHOSPHATE 75 MG PO CAPS: 75.0000 mg | ORAL_CAPSULE | Freq: Two times a day (BID) | ORAL | 0 refills | Status: DC

## 2016-06-02 MED ORDER — OSELTAMIVIR PHOSPHATE 75 MG PO CAPS: 75.0000 mg | ORAL_CAPSULE | Freq: Every day | ORAL | 0 refills | Status: AC

## 2016-06-02 MED ORDER — BENZONATATE 100 MG PO CAPS: 100.0000 mg | ORAL_CAPSULE | Freq: Three times a day (TID) | ORAL | 0 refills | Status: DC

## 2016-06-02 NOTE — ED Notes (Signed)
Pt ambulatory at DC, verbalized understanding of DC teaching. Pt given a note for work X2 days.

## 2016-06-02 NOTE — ED Provider Notes (Signed)
[facility]-EMERGENCY DEPT Provider Note   CSN: [id number] Arrival date & time: 06/02/16  1722     History   Chief Complaint Chief Complaint  Patient presents with  . Generalized Body Aches    HPI Laurie Reed is a 31 y.o. female.  31 yo F with a cc of a viral-like syndrome. Going on for the past couple days. Having generalized body aches. Has been around a couple people who were recently diagnosed with the flu.   The history is provided by the patient.  Illness  This is a new problem. The current episode started yesterday. The problem occurs constantly. The problem has not changed since onset.Pertinent negatives include no chest pain, no headaches and no shortness of breath. Nothing aggravates the symptoms. Nothing relieves the symptoms. She has tried nothing for the symptoms. The treatment provided no relief.    Past Medical History:  Diagnosis Date  . Acid reflux 2004  . Anxiety 2008  . Asthma 1993   no previous intubations or hospitalizations   . Chlamydia   . Depression 2008   no meds, currenly ok  . Environmental allergies   . Migraine   . Migraines   . Miscarriage   . Ovarian cyst   . Reflux   . Ulcer of the stomach and intestine 2004  . Urinary tract infection     Patient Active Problem List   Diagnosis Date Noted  . Foot swelling 02/18/2016  . Bilateral hand pain 10/30/2015  . Right carpal tunnel syndrome 10/30/2015  . Polymyalgia (HCC) 10/30/2015  . Polyarthralgia 10/30/2015  . Astigmatism 09/18/2015  . Asthma 06/30/2015  . Scalp lesion 06/30/2015  . Vitamin D deficiency 01/22/2015  . Severe obesity (BMI >= 40) (HCC) 01/21/2015  . Foot pain, bilateral 01/21/2015  . Fatigue 01/21/2015  . Back pain 01/21/2015  . Tinea capitis 01/21/2015  . Anxiety and depression 01/21/2015  . Patellofemoral arthralgia of right knee 01/08/2015  . Ligamentous laxity of right ankle 01/08/2015  . Right knee pain 01/11/2014  . Headache(784.0) 01/11/2014    Past  Surgical History:  Procedure Laterality Date  . COLONOSCOPY  2004   . MULTIPLE TOOTH EXTRACTIONS  2015    3 teeth removed   . WISDOM TOOTH EXTRACTION  2007    OB History    Gravida Para Term Preterm AB Living   5 3 3  0 2 3   SAB TAB Ectopic Multiple Live Births   2 0 0 0 3       Home Medications    Prior to Admission medications   Medication Sig Start Date End Date Taking? Authorizing Provider  acetaminophen (TYLENOL) 325 MG tablet Take 650 mg by mouth every 6 (six) hours as needed for mild pain. Reported on 10/30/2015    Historical Provider, MD  albuterol (ACCUNEB) 0.63 MG/3ML nebulizer solution Inhale 1 ampule by nebulization every six (6) hours as needed for wheezing.    Historical Provider, MD  albuterol (PROVENTIL HFA;VENTOLIN HFA) 108 (90 Base) MCG/ACT inhaler Inhale 2 puffs into the lungs every 6 (six) hours as needed for wheezing or shortness of breath. 06/30/15   Josalyn Funches, MD  amitriptyline (ELAVIL) 150 MG tablet Take 0.5 tablets (75 mg total) by mouth at bedtime. 02/11/16   Ankit Karis Juba, MD  benzonatate (TESSALON) 100 MG capsule Take 1 capsule (100 mg total) by mouth every 8 (eight) hours. 06/02/16   Melene Plan, DO  diclofenac sodium (VOLTAREN) 1 % GEL Apply 2 g topically 4 (four)  times daily. 02/11/16   Ankit Karis Juba, MD  DULoxetine (CYMBALTA) 30 MG capsule Take 1 capsule (30 mg total) by mouth daily. 02/11/16   Ankit Karis Juba, MD  Elastic Bandages & Supports (MEDICAL COMPRESSION SOCKS) MISC 1 each by Does not apply route daily. Knee high compression stockings 25-30 mmHg pressure 02/17/16   Josalyn Funches, MD  fluconazole (DIFLUCAN) 150 MG tablet Take 1 tablet (150 mg total) by mouth daily. 12/10/15   Harolyn Rutherford Rasch, NP  fluticasone (FLONASE) 50 MCG/ACT nasal spray 1 spray by Each Nare route daily.    Historical Provider, MD  furosemide (LASIX) 40 MG tablet Take 1 tablet (40 mg total) by mouth daily. 12/06/15   Tharon Aquas, PA  gabapentin (NEURONTIN) 300 MG  capsule Take 2 capsules (600 mg total) by mouth at bedtime. 01/07/16   Glendale Chard, DO  ibuprofen (ADVIL,MOTRIN) 600 MG tablet  06/11/15   Historical Provider, MD  naproxen (NAPROSYN) 500 MG tablet Take 1 tablet (500 mg total) by mouth 2 (two) times daily with a meal. 10/30/15   Dessa Phi, MD  oseltamivir (TAMIFLU) 75 MG capsule Take 1 capsule (75 mg total) by mouth every 12 (twelve) hours. 06/02/16   Melene Plan, DO  oseltamivir (TAMIFLU) 75 MG capsule Take 1 capsule (75 mg total) by mouth daily. 06/02/16 06/12/16  Melene Plan, DO  oxyCODONE-acetaminophen (PERCOCET/ROXICET) 5-325 MG tablet Take by mouth. 10/09/13   Historical Provider, MD  topiramate (TOPAMAX) 25 MG tablet Take 1 tablet (25 mg total) by mouth daily. 01/07/16   Donika Concha Se, DO  traMADol (ULTRAM) 50 MG tablet Take 2 tablets (100 mg total) by mouth every 6 (six) hours as needed. 02/11/16   Ankit Karis Juba, MD  Vitamin D, Ergocalciferol, (DRISDOL) 50000 units CAPS capsule Take 1 capsule (50,000 Units total) by mouth every 7 (seven) days. For 12 weeks 10/31/15   Dessa Phi, MD    Family History Family History  Problem Relation Age of Onset  . Asthma Mother   . Heart disease Mother   . Diabetes Mother   . Hypertension Mother   . Hypertension Father   . Asthma Maternal Aunt   . Hypertension Maternal Aunt   . Diabetes Maternal Aunt   . Asthma Maternal Uncle   . Hypertension Maternal Uncle   . Diabetes Maternal Uncle   . Heart disease Maternal Grandmother   . Diabetes Maternal Grandmother   . Hypertension Maternal Grandmother   . Heart disease Maternal Grandfather   . Diabetes Maternal Grandfather   . Hypertension Maternal Grandfather   . Hypotension Neg Hx   . Anesthesia problems Neg Hx   . Malignant hyperthermia Neg Hx   . Pseudochol deficiency Neg Hx   . Cancer Neg Hx     Social History Social History  Substance Use Topics  . Smoking status: Never Smoker  . Smokeless tobacco: Never Used  . Alcohol use No      Allergies   Other; Latex; and Zofran   Review of Systems Review of Systems  Constitutional: Positive for chills and fever.  HENT: Positive for congestion. Negative for rhinorrhea.   Eyes: Negative for redness and visual disturbance.  Respiratory: Positive for cough. Negative for shortness of breath and wheezing.   Cardiovascular: Negative for chest pain and palpitations.  Gastrointestinal: Positive for nausea. Negative for vomiting.  Genitourinary: Negative for dysuria and urgency.  Musculoskeletal: Positive for myalgias. Negative for arthralgias.  Skin: Negative for pallor and wound.  Neurological: Negative  for dizziness and headaches.     Physical Exam Updated Vital Signs BP 157/75 (BP Location: Left Arm)   Pulse 104   Temp 97.7 F (36.5 C) (Oral)   Resp 23   Ht 5\' 3"  (1.6 m)   Wt 243 lb (110.2 kg)   LMP 05/02/2016   SpO2 100%   BMI 43.05 kg/m   Physical Exam  Constitutional: She is oriented to person, place, and time. She appears well-developed and well-nourished. No distress.  HENT:  Head: Normocephalic and atraumatic.  Swollen turbinates, posterior nasal drip, no noted sinus ttp, tm normal bilaterally.    Eyes: EOM are normal. Pupils are equal, round, and reactive to light.  Neck: Normal range of motion. Neck supple.  Cardiovascular: Normal rate and regular rhythm.  Exam reveals no gallop and no friction rub.   No murmur heard. Pulmonary/Chest: Effort normal. She has no wheezes. She has no rales.  Abdominal: Soft. She exhibits no distension. There is no tenderness.  Musculoskeletal: She exhibits no edema or tenderness.  Neurological: She is alert and oriented to person, place, and time.  Skin: Skin is warm and dry. She is not diaphoretic.  Psychiatric: She has a normal mood and affect. Her behavior is normal.  Nursing note and vitals reviewed.    ED Treatments / Results  Labs (all labs ordered are listed, but only abnormal results are  displayed) Labs Reviewed - No data to display  EKG  EKG Interpretation None       Radiology No results found.  Procedures Procedures (including critical care time)  Medications Ordered in ED Medications - No data to display   Initial Impression / Assessment and Plan / ED Course  I have reviewed the triage vital signs and the nursing notes.  Pertinent labs & imaging results that were available during my care of the patient were reviewed by me and considered in my medical decision making (see chart for details).  Clinical Course     31 yo F with URI like illness.  Going on for past couple of days.  Well appearing non toxic. Clear lungs, no tachypnea.  Patient requesting tamiflu.  Discussed risk and benefits of medicine.  D/c home.   6:12 PM:  I have discussed the diagnosis/risks/treatment options with the patient and believe the pt to be eligible for discharge home to follow-up with PCP. We also discussed returning to the ED immediately if new or worsening sx occur. We discussed the sx which are most concerning (e.g., sudden worsening pain, fever, inability to tolerate by mouth) that necessitate immediate return. Medications administered to the patient during their visit and any new prescriptions provided to the patient are listed below.  Medications given during this visit Medications - No data to display   The patient appears reasonably screen and/or stabilized for discharge and I doubt any other medical condition or other [facility] requiring further screening, evaluation, or treatment in the ED at this time prior to discharge.    Final Clinical Impressions(s) / ED Diagnoses   Final diagnoses:  Influenza-like illness    New Prescriptions New Prescriptions   BENZONATATE (TESSALON) 100 MG CAPSULE    Take 1 capsule (100 mg total) by mouth every 8 (eight) hours.   OSELTAMIVIR (TAMIFLU) 75 MG CAPSULE    Take 1 capsule (75 mg total) by mouth every 12 (twelve) hours.     Melene Planan  Brenson Hartman, DO 06/02/16 1812

## 2016-06-02 NOTE — ED Triage Notes (Signed)
The pt has generalized body aches  Since yesterday with chills sweasting  Non-prodcutive cough  A daughter tested pos for the flu  lmp last month

## 2016-07-01 ENCOUNTER — Ambulatory Visit: Payer: Medicaid Other | Attending: Family Medicine | Admitting: Family Medicine

## 2016-07-01 ENCOUNTER — Encounter: Payer: Self-pay | Admitting: Family Medicine

## 2016-07-01 VITALS — BP 116/81 | HR 85 | Temp 98.3°F | Ht 62.0 in | Wt 236.4 lb

## 2016-07-01 DIAGNOSIS — G959 Disease of spinal cord, unspecified: Secondary | ICD-10-CM

## 2016-07-01 DIAGNOSIS — Z79899 Other long term (current) drug therapy: Secondary | ICD-10-CM | POA: Diagnosis not present

## 2016-07-01 DIAGNOSIS — G9589 Other specified diseases of spinal cord: Secondary | ICD-10-CM | POA: Insufficient documentation

## 2016-07-01 DIAGNOSIS — M5442 Lumbago with sciatica, left side: Secondary | ICD-10-CM

## 2016-07-01 DIAGNOSIS — Z6841 Body Mass Index (BMI) 40.0 and over, adult: Secondary | ICD-10-CM | POA: Diagnosis not present

## 2016-07-01 MED ORDER — GABAPENTIN 300 MG PO CAPS: 300.0000 mg | ORAL_CAPSULE | Freq: Two times a day (BID) | ORAL | 0 refills | Status: DC

## 2016-07-01 MED ORDER — TRAMADOL HCL 50 MG PO TABS: 100.0000 mg | ORAL_TABLET | Freq: Three times a day (TID) | ORAL | 0 refills | Status: DC | PRN

## 2016-07-01 MED ORDER — NAPROXEN 500 MG PO TABS: 500.0000 mg | ORAL_TABLET | Freq: Two times a day (BID) | ORAL | 0 refills | Status: DC

## 2016-07-01 NOTE — Assessment & Plan Note (Signed)
Exam and history consistent with sciatica Patient's effort on exam was poor due to pain, so I suspect her weakness is not as severe as it seems on exam Plan: Out of work for 3 weeks Close f/u Gabapentin 300 mg BID, naproxen 500 mg BID with food, tramadol 50-100 mg TID prn

## 2016-07-01 NOTE — Patient Instructions (Addendum)
Laurie Reed was seen today for pain.  Diagnoses and all orders for this visit:  Acute left-sided low back pain with left-sided sciatica -     naproxen (NAPROSYN) 500 MG tablet; Take 1 tablet (500 mg total) by mouth 2 (two) times daily with a meal. -     traMADol (ULTRAM) 50 MG tablet; Take 2 tablets (100 mg total) by mouth every 8 (eight) hours as needed. -     gabapentin (NEURONTIN) 300 MG capsule; Take 1 capsule (300 mg total) by mouth 2 (two) times daily.  Cervical myelopathy (HCC) -     naproxen (NAPROSYN) 500 MG tablet; Take 1 tablet (500 mg total) by mouth 2 (two) times daily with a meal. -     traMADol (ULTRAM) 50 MG tablet; Take 2 tablets (100 mg total) by mouth every 8 (eight) hours as needed. -     gabapentin (NEURONTIN) 300 MG capsule; Take 1 capsule (300 mg total) by mouth 2 (two) times daily.    F/u in 1 week for work related injury  Dr. Armen PickupFunches

## 2016-07-01 NOTE — Progress Notes (Signed)
Subjective:  Patient ID: Laurie Reed, female    DOB: [birth year]  Age: 31 y.o. MRN: [id number]  CC: Pain   HPI Laurie Reed has severe obesity, history of pain in multiple areas (right knee pain, bilateral foot, back, wrist, bilateral hands) she presents for    1. Work related injury: she reports that she was injured at work on 06/20/2015. She works at [employer]. 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 She does not recall what activity initiated the pain. She was at work walking down the hall when she developed severe L sided low  back pain radiating down her L, pain was 10/10. Over the following week the pain worsened and started to involve her L neck and arm as well.  Sharp pain. Pain is constant. Pain interferes with sleep. She reported the pain to her employer on 06/24/2016 and was sent to [facility] on 06/24/2016. She was diagnosed with sciatica. She was prescribed prednisone and robaxin. On 06/26/2016 she went back to [facility] and received a Toradol shot.   Today she reports pain is 9/10. Pain in neck, L shoulder, entire L side of back and left leg. She cannot sleep due to pain. She is currently taking elavil 75 mg nightly, gabapentin 300 mg during the day, robaxin 500 mg at bedtime. She completed the prednisone taper.   Social History  Substance Use Topics  . Smoking status: Never Smoker  . Smokeless tobacco: Never Used  . Alcohol use No    Outpatient Medications Prior to Visit  Medication Sig Dispense Refill  . albuterol (PROVENTIL HFA;VENTOLIN HFA) 108 (90 Base) MCG/ACT inhaler Inhale 2 puffs into the lungs every 6 (six) hours as needed for wheezing or shortness of breath. 8 g 1  . amitriptyline (ELAVIL) 150 MG tablet Take 0.5 tablets (75 mg total) by mouth at bedtime. 15 tablet 1  . fluticasone (FLONASE) 50 MCG/ACT nasal spray 1 spray by Each Nare route daily.    . furosemide (LASIX) 40 MG tablet Take 1  tablet (40 mg total) by mouth daily. 30 tablet 0  . gabapentin (NEURONTIN) 300 MG capsule Take 2 capsules (600 mg total) by mouth at bedtime. 180 capsule 3  . naproxen (NAPROSYN) 500 MG tablet Take 1 tablet (500 mg total) by mouth 2 (two) times daily with a meal. 60 tablet 0  . topiramate (TOPAMAX) 25 MG tablet Take 1 tablet (25 mg total) by mouth daily. 30 tablet 11  . traMADol (ULTRAM) 50 MG tablet Take 2 tablets (100 mg total) by mouth every 6 (six) hours as needed. 360 tablet 0  . Vitamin D, Ergocalciferol, (DRISDOL) 50000 units CAPS capsule Take 1 capsule (50,000 Units total) by mouth every 7 (seven) days. For 12 weeks 4 capsule 2  . acetaminophen (TYLENOL) 325 MG tablet Take 650 mg by mouth every 6 (six) hours as needed for mild pain. Reported on 10/30/2015    . albuterol (ACCUNEB) 0.63 MG/3ML nebulizer solution Inhale 1 ampule by nebulization every six (6) hours as needed for wheezing.    . benzonatate (TESSALON) 100 MG capsule Take 1 capsule (100 mg total) by mouth every 8 (eight) hours. (Patient not taking: Reported on 07/01/2016) 21 capsule 0  . diclofenac sodium (VOLTAREN) 1 % GEL Apply 2 g topically 4 (four) times daily. (Patient not taking: Reported on 07/01/2016) 1 Tube 1  . DULoxetine (CYMBALTA) 30 MG capsule Take 1 capsule (30 mg  total) by mouth daily. (Patient not taking: Reported on 07/01/2016) 30 capsule 1  . Elastic Bandages & Supports (MEDICAL COMPRESSION SOCKS) MISC 1 each by Does not apply route daily. Knee high compression stockings 25-30 mmHg pressure (Patient not taking: Reported on 07/01/2016) 2 each 0  . fluconazole (DIFLUCAN) 150 MG tablet Take 1 tablet (150 mg total) by mouth daily. (Patient not taking: Reported on 07/01/2016) 1 tablet 1  . ibuprofen (ADVIL,MOTRIN) 600 MG tablet   0  . oseltamivir (TAMIFLU) 75 MG capsule Take 1 capsule (75 mg total) by mouth every 12 (twelve) hours. (Patient not taking: Reported on 07/01/2016) 10 capsule 0  . oxyCODONE-acetaminophen  (PERCOCET/ROXICET) 5-325 MG tablet Take by mouth.     No facility-administered medications prior to visit.     ROS Review of Systems  Constitutional: Negative for chills and fever.  Eyes: Negative for visual disturbance.  Respiratory: Negative for shortness of breath.   Cardiovascular: Negative for chest pain.  Gastrointestinal: Negative for abdominal pain and blood in stool.  Musculoskeletal: Positive for back pain, gait problem, myalgias, neck pain and neck stiffness. Negative for arthralgias and joint swelling.  Skin: Negative for rash.  Allergic/Immunologic: Negative for immunocompromised state.  Hematological: Negative for adenopathy. Does not bruise/bleed easily.  Psychiatric/Behavioral: Positive for sleep disturbance. Negative for dysphoric mood and suicidal ideas.    Objective:  BP 116/81 (BP Location: Left Arm, Patient Position: Sitting, Cuff Size: Large)   Pulse 85   Temp 98.3 F (36.8 C) (Oral)   Ht 5\' 2"  (1.575 m)   Wt 236 lb 6.4 oz (107.2 kg)   SpO2 100%   BMI 43.24 kg/m   BP/Weight 07/01/2016 06/02/2016 02/17/2016  Systolic BP 116 122 96  Diastolic BP 81 69 68  Wt. (Lbs) 236.4 243 242.6  BMI 43.24 43.05 44.37    Physical Exam  Constitutional: She is oriented to person, place, and time. She appears well-developed and well-nourished. No distress.  Obese   HENT:  Head: Normocephalic and atraumatic.  Neck: Normal range of motion. Neck supple. Muscular tenderness (L side ) present. No spinous process tenderness present. No neck rigidity. No edema, no erythema and normal range of motion present.  Cardiovascular: Normal rate, regular rhythm, normal heart sounds and intact distal pulses.   Pulmonary/Chest: Effort normal and breath sounds normal.  Musculoskeletal: She exhibits no edema.       Lumbar back: She exhibits decreased range of motion, tenderness, pain and spasm. She exhibits no bony tenderness, no swelling, no edema, no deformity, no laceration and normal  pulse.  Back Exam: Back: Normal Curvature, no deformities or CVA tenderness  Paraspinal Tenderness: lumbar L side   LE Strength 5/5  LE Sensation: in tact  LE Reflexes 2+ and symmetric  Straight leg raise: + on L    Neurological: She is alert and oriented to person, place, and time. Gait abnormal.  Decreased strength in L hand compared to R   Skin: Skin is warm and dry. No rash noted.  Psychiatric: She has a normal mood and affect.     Assessment & Plan:  Iyla was seen today for pain.  Diagnoses and all orders for this visit:  Acute left-sided low back pain with left-sided sciatica -     naproxen (NAPROSYN) 500 MG tablet; Take 1 tablet (500 mg total) by mouth 2 (two) times daily with a meal. -     traMADol (ULTRAM) 50 MG tablet; Take 2 tablets (100 mg total) by mouth every 8 (  eight) hours as needed. -     gabapentin (NEURONTIN) 300 MG capsule; Take 1 capsule (300 mg total) by mouth 2 (two) times daily.  Cervical myelopathy (HCC) -     naproxen (NAPROSYN) 500 MG tablet; Take 1 tablet (500 mg total) by mouth 2 (two) times daily with a meal. -     traMADol (ULTRAM) 50 MG tablet; Take 2 tablets (100 mg total) by mouth every 8 (eight) hours as needed. -     gabapentin (NEURONTIN) 300 MG capsule; Take 1 capsule (300 mg total) by mouth 2 (two) times daily.   There are no diagnoses linked to this encounter.  No orders of the defined types were placed in this encounter.   Follow-up: Return in about 1 week (around 07/08/2016) for work related injury .   Dessa Phi MD

## 2016-07-09 ENCOUNTER — Ambulatory Visit: Payer: Self-pay | Admitting: Family Medicine

## 2016-07-12 ENCOUNTER — Ambulatory Visit: Payer: Medicaid Other | Attending: Family Medicine | Admitting: Family Medicine

## 2016-07-12 ENCOUNTER — Encounter: Payer: Self-pay | Admitting: Family Medicine

## 2016-07-12 VITALS — BP 113/80 | HR 77 | Temp 98.1°F | Ht 62.0 in | Wt 238.2 lb

## 2016-07-12 DIAGNOSIS — Z6841 Body Mass Index (BMI) 40.0 and over, adult: Secondary | ICD-10-CM | POA: Insufficient documentation

## 2016-07-12 DIAGNOSIS — M79605 Pain in left leg: Secondary | ICD-10-CM | POA: Diagnosis not present

## 2016-07-12 DIAGNOSIS — M5442 Lumbago with sciatica, left side: Secondary | ICD-10-CM | POA: Diagnosis present

## 2016-07-12 DIAGNOSIS — M542 Cervicalgia: Secondary | ICD-10-CM | POA: Insufficient documentation

## 2016-07-12 DIAGNOSIS — Z79899 Other long term (current) drug therapy: Secondary | ICD-10-CM | POA: Insufficient documentation

## 2016-07-12 DIAGNOSIS — M25512 Pain in left shoulder: Secondary | ICD-10-CM | POA: Diagnosis not present

## 2016-07-12 NOTE — Progress Notes (Signed)
Subjective:  Patient ID: Laurie Reed, female    DOB: [birth year]  Age: 31 y.o. MRN: [id number]  CC: No chief complaint on file.   HPI Laurie Reed has severe obesity, history of pain in multiple areas (right knee pain, bilateral foot, back, wrist, bilateral hands) she presents for    1. Work related injury: she reports that she was injured at work on 06/20/2015. She works at [employer]. 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 She does not recall what activity initiated the pain. She was at work walking down the hall when she developed severe L sided low  back pain radiating down her L, pain was 10/10. Over the following week the pain worsened and started to involve her L neck and arm as well.  Sharp pain. Pain is constant. Pain interferes with sleep. She reported the pain to her employer on 06/24/2016 and was sent to [facility] on 06/24/2016. She was diagnosed with sciatica. She was prescribed prednisone and robaxin. On 06/26/2016 she went back to [facility] and received a Toradol shot.   Today she reports pain is 8/10. Pain in neck, L shoulder, entire L side of back and left leg. She cannot sleep due to pain. She is currently taking gabapentin 300 mg BID. Tramadol 50 mg nightly. Naproxen 500 mg during the day.   Social History  Substance Use Topics  . Smoking status: Never Smoker  . Smokeless tobacco: Never Used  . Alcohol use No    Outpatient Medications Prior to Visit  Medication Sig Dispense Refill  . albuterol (ACCUNEB) 0.63 MG/3ML nebulizer solution Inhale 1 ampule by nebulization every six (6) hours as needed for wheezing.    . gabapentin (NEURONTIN) 300 MG capsule Take 1 capsule (300 mg total) by mouth 2 (two) times daily. 60 capsule 0  . naproxen (NAPROSYN) 500 MG tablet Take 1 tablet (500 mg total) by mouth 2 (two) times daily with a meal. 60 tablet 0  . traMADol (ULTRAM) 50 MG tablet Take 2 tablets (100 mg  total) by mouth every 8 (eight) hours as needed. 90 tablet 0  . acetaminophen (TYLENOL) 325 MG tablet Take 650 mg by mouth every 6 (six) hours as needed for mild pain. Reported on 10/30/2015    . albuterol (PROVENTIL HFA;VENTOLIN HFA) 108 (90 Base) MCG/ACT inhaler Inhale 2 puffs into the lungs every 6 (six) hours as needed for wheezing or shortness of breath. (Patient not taking: Reported on 07/12/2016) 8 g 1  . amitriptyline (ELAVIL) 150 MG tablet Take 0.5 tablets (75 mg total) by mouth at bedtime. (Patient not taking: Reported on 07/12/2016) 15 tablet 1  . fluticasone (FLONASE) 50 MCG/ACT nasal spray 1 spray by Each Nare route daily.    . furosemide (LASIX) 40 MG tablet Take 1 tablet (40 mg total) by mouth daily. (Patient not taking: Reported on 07/12/2016) 30 tablet 0  . ibuprofen (ADVIL,MOTRIN) 600 MG tablet   0  . topiramate (TOPAMAX) 25 MG tablet Take 1 tablet (25 mg total) by mouth daily. (Patient not taking: Reported on 07/12/2016) 30 tablet 11  . Vitamin D, Ergocalciferol, (DRISDOL) 50000 units CAPS capsule Take 1 capsule (50,000 Units total) by mouth every 7 (seven) days. For 12 weeks (Patient not taking: Reported on 07/12/2016) 4 capsule 2   No facility-administered medications prior to visit.     ROS Review of Systems  Constitutional: Positive for fatigue. Negative for chills and  fever.  Eyes: Negative for visual disturbance.  Respiratory: Negative for shortness of breath.   Cardiovascular: Negative for chest pain.  Gastrointestinal: Negative for abdominal pain and blood in stool.  Musculoskeletal: Positive for back pain, gait problem, myalgias, neck pain and neck stiffness. Negative for arthralgias and joint swelling.  Skin: Negative for rash.  Allergic/Immunologic: Negative for immunocompromised state.  Hematological: Negative for adenopathy. Does not bruise/bleed easily.  Psychiatric/Behavioral: Positive for sleep disturbance. Negative for dysphoric mood and suicidal ideas.     Objective:  BP 113/80 (BP Location: Right Arm, Patient Position: Sitting, Cuff Size: Large)   Pulse 77   Temp 98.1 F (36.7 C) (Oral)   Ht 5\' 2"  (1.575 m)   Wt 238 lb 3.2 oz (108 kg)   LMP 07/12/2016   SpO2 100%   BMI 43.57 kg/m   BP/Weight 07/12/2016 07/01/2016 06/02/2016  Systolic BP 113 116 122  Diastolic BP 80 81 69  Wt. (Lbs) 238.2 236.4 243  BMI 43.57 43.24 43.05    Physical Exam  Constitutional: She is oriented to person, place, and time. She appears well-developed and well-nourished. No distress.  Obese   HENT:  Head: Normocephalic and atraumatic.  Neck: Normal range of motion. Neck supple. Muscular tenderness (L side ) present. No spinous process tenderness present. No neck rigidity. No edema, no erythema and normal range of motion present.  Cardiovascular: Normal rate, regular rhythm, normal heart sounds and intact distal pulses.   Pulmonary/Chest: Effort normal and breath sounds normal.  Musculoskeletal: She exhibits no edema.       Lumbar back: She exhibits decreased range of motion, tenderness, pain and spasm. She exhibits no bony tenderness, no swelling, no edema, no deformity, no laceration and normal pulse.  Back Exam: Back: Normal Curvature, no deformities or CVA tenderness  Paraspinal Tenderness: lumbar L side   LE Strength 5/5  LE Sensation: in tact  LE Reflexes 2+ and symmetric  Straight leg raise: + on L    Neurological: She is alert and oriented to person, place, and time. Gait abnormal.  Decreased strength in L hand compared to R   Skin: Skin is warm and dry. No rash noted.  Psychiatric: She has a normal mood and affect.     Assessment & Plan:  Laurie Highlandamika was seen today for back pain.  Diagnoses and all orders for this visit:  Acute left-sided low back pain with left-sided sciatica -     Ambulatory referral to Physical Therapy   There are no diagnoses linked to this encounter.  No orders of the defined types were placed in this  encounter.   Follow-up: Return in about 3 weeks (around 08/02/2016) for acute back and left side pain .   Dessa PhiJosalyn Aiman Sonn MD

## 2016-07-12 NOTE — Progress Notes (Signed)
Pt is here today to follow up on work injury.

## 2016-07-12 NOTE — Patient Instructions (Addendum)
Diagnoses and all orders for this visit:  Acute left-sided low back pain with left-sided sciatica -     Ambulatory referral to Physical Therapy   Current pain regimen Naproxen 500 mg twice daily Gabapentin 300 mg twice daily  Tramadol 50-100 mg in evening  Start physical therapy Out of work for 3 more weeks  F/u in 3 weeks   Dr. Armen PickupFunches

## 2016-07-12 NOTE — Assessment & Plan Note (Signed)
Patient with persistent low back pain Slight improvement Still with disability  Plan: Out of work for 3 more weeks PT ordered F/u in 3 weeks  Current pain regimen Naproxen 500 mg twice daily Gabapentin 300 mg twice daily  Tramadol 50-100 mg in evening

## 2016-07-14 ENCOUNTER — Encounter (HOSPITAL_COMMUNITY): Payer: Self-pay | Admitting: *Deleted

## 2016-07-14 DIAGNOSIS — M5442 Lumbago with sciatica, left side: Secondary | ICD-10-CM | POA: Insufficient documentation

## 2016-07-14 DIAGNOSIS — Y939 Activity, unspecified: Secondary | ICD-10-CM | POA: Insufficient documentation

## 2016-07-14 DIAGNOSIS — J45909 Unspecified asthma, uncomplicated: Secondary | ICD-10-CM | POA: Insufficient documentation

## 2016-07-14 DIAGNOSIS — Y929 Unspecified place or not applicable: Secondary | ICD-10-CM | POA: Insufficient documentation

## 2016-07-14 DIAGNOSIS — R51 Headache: Secondary | ICD-10-CM | POA: Diagnosis not present

## 2016-07-14 DIAGNOSIS — R32 Unspecified urinary incontinence: Secondary | ICD-10-CM | POA: Insufficient documentation

## 2016-07-14 DIAGNOSIS — Y990 Civilian activity done for income or pay: Secondary | ICD-10-CM | POA: Diagnosis not present

## 2016-07-14 DIAGNOSIS — Z91040 Latex allergy status: Secondary | ICD-10-CM | POA: Insufficient documentation

## 2016-07-14 DIAGNOSIS — X500XXA Overexertion from strenuous movement or load, initial encounter: Secondary | ICD-10-CM | POA: Insufficient documentation

## 2016-07-14 DIAGNOSIS — Z79899 Other long term (current) drug therapy: Secondary | ICD-10-CM | POA: Diagnosis not present

## 2016-07-14 DIAGNOSIS — M545 Low back pain: Secondary | ICD-10-CM | POA: Diagnosis present

## 2016-07-14 NOTE — ED Triage Notes (Signed)
Pt c/o left sided body pain following an accident at work on 2/3. Pt works as a LawyerCNA, has been seen for pain and prescribed Naproxen, Gabapentin, and Tramadol. Has been taking medications without relief. Pt reports incontinence of urine with increased shooting pains in L hip and leg.

## 2016-07-15 ENCOUNTER — Emergency Department (HOSPITAL_COMMUNITY): Payer: Medicaid Other

## 2016-07-15 ENCOUNTER — Ambulatory Visit: Payer: Medicaid Other | Admitting: Physical Therapy

## 2016-07-15 ENCOUNTER — Encounter (HOSPITAL_COMMUNITY): Payer: Self-pay | Admitting: *Deleted

## 2016-07-15 ENCOUNTER — Emergency Department (HOSPITAL_COMMUNITY)
Admission: EM | Admit: 2016-07-15 | Discharge: 2016-07-15 | Disposition: A | Discharge status: Home / Self Care | Payer: Medicaid Other | Attending: Emergency Medicine | Admitting: Emergency Medicine

## 2016-07-15 DIAGNOSIS — M5442 Lumbago with sciatica, left side: Secondary | ICD-10-CM

## 2016-07-15 DIAGNOSIS — R32 Unspecified urinary incontinence: Secondary | ICD-10-CM

## 2016-07-15 LAB — URINALYSIS, ROUTINE W REFLEX MICROSCOPIC
BILIRUBIN URINE: NEGATIVE
Glucose, UA: NEGATIVE mg/dL
Ketones, ur: NEGATIVE mg/dL
Leukocytes, UA: NEGATIVE
Nitrite: NEGATIVE
PROTEIN: NEGATIVE mg/dL
SPECIFIC GRAVITY, URINE: 1.005 (ref 1.005–1.030)
pH: 6 (ref 5.0–8.0)

## 2016-07-15 LAB — BASIC METABOLIC PANEL
ANION GAP: 8 (ref 5–15)
BUN: 8 mg/dL (ref 6–20)
CALCIUM: 8.6 mg/dL — AB (ref 8.9–10.3)
CO2: 24 mmol/L (ref 22–32)
Chloride: 104 mmol/L (ref 101–111)
Creatinine, Ser: 0.68 mg/dL (ref 0.44–1.00)
Glucose, Bld: 105 mg/dL — ABNORMAL HIGH (ref 65–99)
POTASSIUM: 3.6 mmol/L (ref 3.5–5.1)
Sodium: 136 mmol/L (ref 135–145)

## 2016-07-15 LAB — CBC WITH DIFFERENTIAL/PLATELET
BASOS ABS: 0 10*3/uL (ref 0.0–0.1)
BASOS PCT: 0 %
EOS PCT: 5 %
Eosinophils Absolute: 0.4 10*3/uL (ref 0.0–0.7)
HCT: 33.4 % — ABNORMAL LOW (ref 36.0–46.0)
Hemoglobin: 10.6 g/dL — ABNORMAL LOW (ref 12.0–15.0)
LYMPHS PCT: 44 %
Lymphs Abs: 3.7 10*3/uL (ref 0.7–4.0)
MCH: 25.4 pg — ABNORMAL LOW (ref 26.0–34.0)
MCHC: 31.7 g/dL (ref 30.0–36.0)
MCV: 80.1 fL (ref 78.0–100.0)
Monocytes Absolute: 0.6 10*3/uL (ref 0.1–1.0)
Monocytes Relative: 7 %
NEUTROS ABS: 3.8 10*3/uL (ref 1.7–7.7)
Neutrophils Relative %: 44 %
PLATELETS: 327 10*3/uL (ref 150–400)
RBC: 4.17 MIL/uL (ref 3.87–5.11)
RDW: 13.8 % (ref 11.5–15.5)
WBC: 8.6 10*3/uL (ref 4.0–10.5)

## 2016-07-15 LAB — PREGNANCY, URINE: PREG TEST UR: NEGATIVE

## 2016-07-15 MED ORDER — IBUPROFEN 800 MG PO TABS
800.0000 mg | ORAL_TABLET | Freq: Once | ORAL | Status: AC
  Administered 2016-07-15: 800 mg via ORAL
  Filled 2016-07-15: qty 1

## 2016-07-15 MED ORDER — METHYLPREDNISOLONE 4 MG PO TBPK: ORAL_TABLET | ORAL | 0 refills | Status: DC

## 2016-07-15 MED ORDER — OXYCODONE-ACETAMINOPHEN 5-325 MG PO TABS
2.0000 | ORAL_TABLET | Freq: Once | ORAL | Status: AC
  Administered 2016-07-15: 2 via ORAL
  Filled 2016-07-15: qty 2

## 2016-07-15 NOTE — ED Notes (Addendum)
Pt taken to CT.

## 2016-07-15 NOTE — ED Notes (Signed)
Patient transported to MRI 

## 2016-07-15 NOTE — ED Provider Notes (Signed)
[facility]-EMERGENCY DEPT Provider Note   CSN: [id number] Arrival date & time: 07/14/16  2102 By signing my name below, I, Laurie HedgerElizabeth Reed, attest that this documentation has been prepared under the direction and in the presence of Glynn OctaveStephen Sarabi Sockwell, MD . Electronically Signed: Levon HedgerElizabeth Reed, Scribe. 07/15/2016. 1:25 AM.   History   Chief Complaint Chief Complaint  Patient presents with  . Pain   HPI Laurie Reed is a 31 y.o. female with a history of asthma who presents to the Emergency Department complaining of persistent, gradually worsening lower back pain that radiates to her left leg onset 06/19/16. 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 She describes this as severe, sharp, shooting pain with no alleviating or modifying factors. She has taken Naproxen,  Robaxin, prednisone, Gabapentin and Tramadol with no relief of pain. Pt has been evaluated by her PCP and at urgent care where she was diagnosed with sciatica. She has not had any imaging since pain began. Pt reports associated urinary incontinence which began yesterday, left sided weakness, and numbness. Per pt, she was incontinent of urine twice yesterday. She also reports falling today due to leg weakness. Pt has been employed at this job since October 2017 and has been out of work since 06/24/16 due to pain.Pt denies any neck pain, saddle anesthesia, fevers, vomiting, bowel incontinence, or any other associated symptoms.   The history is provided by the patient. No language interpreter was used.   Past Medical History:  Diagnosis Date  . Acid reflux 2004  . Anxiety 2008  . Asthma 1993   no previous intubations or hospitalizations   . Chlamydia   . Depression 2008   no meds, currenly ok  . Environmental allergies   . Migraine   . Migraines   . Miscarriage   . Ovarian cyst   . Reflux   . Ulcer of the stomach and intestine 2004  .  Urinary tract infection    Patient Active Problem List   Diagnosis Date Noted  . Cervical myelopathy (HCC) 07/01/2016  . Foot swelling 02/18/2016  . Bilateral hand pain 10/30/2015  . Right carpal tunnel syndrome 10/30/2015  . Polymyalgia (HCC) 10/30/2015  . Polyarthralgia 10/30/2015  . Astigmatism 09/18/2015  . Asthma 06/30/2015  . Scalp lesion 06/30/2015  . Vitamin D deficiency 01/22/2015  . Severe obesity (BMI >= 40) (HCC) 01/21/2015  . Foot pain, bilateral 01/21/2015  . Fatigue 01/21/2015  . Acute left-sided low back pain with left-sided sciatica 01/21/2015  . Tinea capitis 01/21/2015  . Anxiety and depression 01/21/2015  . Patellofemoral arthralgia of right knee 01/08/2015  . Ligamentous laxity of right ankle 01/08/2015  . Right knee pain 01/11/2014  . Headache(784.0) 01/11/2014    Past Surgical History:  Procedure Laterality Date  . COLONOSCOPY  2004   . MULTIPLE TOOTH EXTRACTIONS  2015    3 teeth removed   . WISDOM TOOTH EXTRACTION  2007    OB History    Gravida Para Term Preterm AB Living   5 3 3  0 2 3   SAB TAB Ectopic Multiple Live Births   2 0 0 0 3       Home Medications    Prior to Admission medications   Medication Sig Start Date End Date Taking? Authorizing Provider  acetaminophen (TYLENOL) 325 MG tablet Take 650 mg by mouth every 6 (six) hours as needed for mild pain.  Reported on 10/30/2015    Historical Provider, MD  albuterol (ACCUNEB) 0.63 MG/3ML nebulizer solution Inhale 1 ampule by nebulization every six (6) hours as needed for wheezing.    Historical Provider, MD  albuterol (PROVENTIL HFA;VENTOLIN HFA) 108 (90 Base) MCG/ACT inhaler Inhale 2 puffs into the lungs every 6 (six) hours as needed for wheezing or shortness of breath. Patient not taking: Reported on 07/12/2016 06/30/15   Dessa Phi, MD  fluticasone (FLONASE) 50 MCG/ACT nasal spray 1 spray by Each Nare route daily.    Historical Provider, MD  gabapentin (NEURONTIN) 300 MG capsule Take  1 capsule (300 mg total) by mouth 2 (two) times daily. 07/01/16   Dessa Phi, MD  ibuprofen (ADVIL,MOTRIN) 600 MG tablet  06/11/15   Historical Provider, MD  naproxen (NAPROSYN) 500 MG tablet Take 1 tablet (500 mg total) by mouth 2 (two) times daily with a meal. 07/01/16   Josalyn Funches, MD  topiramate (TOPAMAX) 25 MG tablet Take 1 tablet (25 mg total) by mouth daily. Patient not taking: Reported on 07/12/2016 01/07/16   Glendale Chard, DO  traMADol (ULTRAM) 50 MG tablet Take 2 tablets (100 mg total) by mouth every 8 (eight) hours as needed. 07/01/16   Josalyn Funches, MD  Vitamin D, Ergocalciferol, (DRISDOL) 50000 units CAPS capsule Take 1 capsule (50,000 Units total) by mouth every 7 (seven) days. For 12 weeks Patient not taking: Reported on 07/12/2016 10/31/15   Dessa Phi, MD   Family History Family History  Problem Relation Age of Onset  . Asthma Mother   . Heart disease Mother   . Diabetes Mother   . Hypertension Mother   . Hypertension Father   . Asthma Maternal Aunt   . Hypertension Maternal Aunt   . Diabetes Maternal Aunt   . Asthma Maternal Uncle   . Hypertension Maternal Uncle   . Diabetes Maternal Uncle   . Heart disease Maternal Grandmother   . Diabetes Maternal Grandmother   . Hypertension Maternal Grandmother   . Heart disease Maternal Grandfather   . Diabetes Maternal Grandfather   . Hypertension Maternal Grandfather   . Hypotension Neg Hx   . Anesthesia problems Neg Hx   . Malignant hyperthermia Neg Hx   . Pseudochol deficiency Neg Hx   . Cancer Neg Hx     Social History Social History  Substance Use Topics  . Smoking status: Never Smoker  . Smokeless tobacco: Never Used  . Alcohol use No    Allergies   Other; Latex; and Zofran   Review of Systems Review of Systems 10 systems reviewed and all are negative for acute change except as noted in the HPI.  Physical Exam Updated Vital Signs BP 157/87   Pulse 96   Temp 98 F (36.7 C)   Resp 18    LMP 07/12/2016   SpO2 100%   Physical Exam  Constitutional: She is oriented to person, place, and time. She appears well-developed and well-nourished. No distress.  Tearful, anxious  HENT:  Head: Normocephalic and atraumatic.  Mouth/Throat: Oropharynx is clear and moist. No oropharyngeal exudate.  Eyes: Conjunctivae and EOM are normal. Pupils are equal, round, and reactive to light.  Neck: Normal range of motion. Neck supple.  No meningismus.  Cardiovascular: Normal rate, regular rhythm, normal heart sounds and intact distal pulses.   No murmur heard. Pulmonary/Chest: Effort normal and breath sounds normal. No respiratory distress.  Abdominal: Soft. There is no tenderness. There is no rebound and no guarding.  Musculoskeletal:  Normal range of motion. She exhibits tenderness. She exhibits no edema.  Left SI and paraspinal lumbar pain. 5/5 strength on the right, poor effort on left with 4/5 strength of hip and knee flexion and extension. Ankle plantar and dorsiflexion intact. Great toe extension intact bilaterally. +2 DP and PT pulses. +2 patellar reflexes bilaterally. Normal gait. Slight left arm weakness with poor effort, strength 4/5   Neurological: She is alert and oriented to person, place, and time. No cranial nerve deficit. She exhibits normal muscle tone. Coordination normal.   5/5 strength throughout. CN 2-12 intact.Equal grip strength.   Skin: Skin is warm.  Psychiatric: Her behavior is normal. Her mood appears anxious.  Nursing note and vitals reviewed.  ED Treatments / Results  DIAGNOSTIC STUDIES:  Oxygen Saturation is 100% on RA, normal by my interpretation.    COORDINATION OF CARE:  1:21 AM Discussed treatment plan with pt at bedside and pt agreed to plan.   Labs (all labs ordered are listed, but only abnormal results are displayed) Labs Reviewed  URINALYSIS, ROUTINE W REFLEX MICROSCOPIC - Abnormal; Notable for the following:       Result Value   Color, Urine STRAW  (*)    Hgb urine dipstick LARGE (*)    Bacteria, UA RARE (*)    Squamous Epithelial / LPF 0-5 (*)    All other components within normal limits  CBC WITH DIFFERENTIAL/PLATELET - Abnormal; Notable for the following:    Hemoglobin 10.6 (*)    HCT 33.4 (*)    MCH 25.4 (*)    All other components within normal limits  BASIC METABOLIC PANEL - Abnormal; Notable for the following:    Glucose, Bld 105 (*)    Calcium 8.6 (*)    All other components within normal limits  PREGNANCY, URINE    EKG  EKG Interpretation None       Radiology Ct Head Wo Contrast  Result Date: 07/15/2016 CLINICAL DATA:  LEFT-sided body pain following an accident 1 month ago. Incontinence. EXAM: CT HEAD WITHOUT CONTRAST TECHNIQUE: Contiguous axial images were obtained from the base of the skull through the vertex without intravenous contrast. COMPARISON:  CT head 09/10/2014. FINDINGS: Brain: No evidence for acute infarction, hemorrhage, mass lesion, hydrocephalus, or extra-axial fluid. Normal cerebral volume. No white matter disease. Partial empty sella. Vascular: No hyperdense vessel or unexpected calcification. Skull: Normal. Negative for fracture or focal lesion. Sinuses/Orbits: No acute finding. Other: None. IMPRESSION: Partial empty sella, likely incidental/ normal variant. No acute intracranial findings. No change from priors. Electronically Signed   By: Elsie Stain M.D.   On: 07/15/2016 07:48   Mr Cervical Spine Wo Contrast  Result Date: 07/15/2016 CLINICAL DATA:  Low back pain radiating to LEFT lower extremity beginning June 19, 2016, lifting injury at work. Evaluate LEFT arm and leg weakness. EXAM: MRI CERVICAL SPINE WITHOUT CONTRAST TECHNIQUE: Multiplanar, multisequence MR imaging of the cervical spine was performed. No intravenous contrast was administered. COMPARISON:  MRI of the cervical spine with contrast Sep 25, 2015 FINDINGS: ALIGNMENT: Straightened cervical lordosis.  No malalignment. VERTEBRAE/DISCS:  Vertebral bodies are intact. Intervertebral disc morphology's and signal are normal. CORD:Cervical spinal cord is normal morphology and signal characteristics from the cervicomedullary junction to level of T1-2, the most caudal well visualized level. POSTERIOR FOSSA, VERTEBRAL ARTERIES, PARASPINAL TISSUES: No MR findings of ligamentous injury. Vertebral artery flow voids present. Included posterior fossa and paraspinal soft tissues are normal. DISC LEVELS: C2-3 through C7-T1: No disc bulge, canal stenosis  nor neural foraminal narrowing. IMPRESSION: Stable examination:  Negative MRI of the cervical spine. Electronically Signed   By: Awilda Metro M.D.   On: 07/15/2016 05:44   Mr Lumbar Spine Wo Contrast  Result Date: 07/15/2016 CLINICAL DATA:  Low back pain radiating to LEFT lower extremity beginning June 19, 2006, lifting injury at work. Evaluate LEFT arm and leg weakness. EXAM: MRI LUMBAR SPINE WITHOUT CONTRAST TECHNIQUE: Multiplanar, multisequence MR imaging of the lumbar spine was performed. No intravenous contrast was administered. COMPARISON:  None. FINDINGS: SEGMENTATION: For the purposes of this report, the last well-formed intervertebral disc will be described as L5-S1, with small S1-2 disc consistent with transitional anatomy. ALIGNMENT: Maintenance of the lumbar lordosis. No malalignment. VERTEBRAE:Vertebral bodies are intact. Intervertebral discs demonstrate normal morphology and signal characteristics. No abnormal bone marrow signal. CONUS MEDULLARIS: Conus medullaris terminates at L1-2 and demonstrates normal morphology and signal characteristics. Cauda equina is normal. PARASPINAL AND SOFT TISSUES: Included prevertebral and paraspinal soft tissues are nonacute. Prominent vessels in the pelvis suggesting pelvic congestion syndrome. DISC LEVELS: L1-2 through L5-S1: No disc bulge, canal stenosis nor neural foraminal narrowing. Mild lower thoracic facet arthropathy. IMPRESSION: Mild lower lumbar  facet arthropathy. Otherwise negative MRI of the lumbar spine. Electronically Signed   By: Awilda Metro M.D.   On: 07/15/2016 06:09    Procedures Procedures (including critical care time)  Medications Ordered in ED Medications - No data to display   Initial Impression / Assessment and Plan / ED Course  I have reviewed the triage vital signs and the nursing notes.  Pertinent labs & imaging results that were available during my care of the patient were reviewed by me and considered in my medical decision making (see chart for details).     Patient complains of pain and weakness to her left leg and low back. Pain is been ongoing since she was "injured at work" on February 3. 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 Over the past several days she's had several episodes of urinary incontinence with weakness and numbness in her left leg. Also has apparent numbness of her arm that comes and goes.  MRI will be obtained given her report of LLE weakness, numbness and incontinence.  Also will obtain MRI C spine given her subjective LUE weakness. Her exam is limited by pain and poor effort.  No head or neck pain.  MRI reassuring.  NO evidence of bulging disc or spinal cord impingement. No cauda equina or cord compression. CT head without acute abnormality.  Pain is improved and patient is able to ambulate. Her strength on L side is improved with pain control.  MRI does not elucidate cause of her symptoms and her weakness is likely more of a pain control issue. Will refer to neurology and PCP for followup.  Return precautions discussed.   Final Clinical Impressions(s) / ED Diagnoses   Final diagnoses:  Incontinence  Left-sided low back pain with left-sided sciatica, unspecified chronicity   New Prescriptions New Prescriptions   No medications on file  I personally performed the services described in this documentation, which was scribed in my presence. The  recorded information has been reviewed and is accurate.    Glynn Octave, MD 07/15/16 [phone]

## 2016-07-15 NOTE — ED Notes (Signed)
ED Provider at bedside. 

## 2016-07-15 NOTE — ED Notes (Signed)
Pt ambulated in hallway, pt complained of pain. Pt had no nausea or dizziness.

## 2016-07-15 NOTE — Discharge Planning (Signed)
Pt up for discharge. EDCM reviewed chart for possible CM needs.  No needs identified or communicated.  

## 2016-07-15 NOTE — Discharge Instructions (Signed)
Your MRI does not show any problems with your spinal cord. Take the steroids as prescribed. Follow up with the neurologist. Return to the ED if you develop new or worsening symptoms.

## 2016-07-15 NOTE — ED Notes (Signed)
MD at the bedside  

## 2016-07-19 ENCOUNTER — Ambulatory Visit: Payer: Medicaid Other

## 2016-07-22 ENCOUNTER — Ambulatory Visit: Payer: Medicaid Other | Attending: Family Medicine | Admitting: Physical Therapy

## 2016-07-22 DIAGNOSIS — M5442 Lumbago with sciatica, left side: Secondary | ICD-10-CM | POA: Diagnosis not present

## 2016-07-22 DIAGNOSIS — M6281 Muscle weakness (generalized): Secondary | ICD-10-CM

## 2016-07-22 DIAGNOSIS — R209 Unspecified disturbances of skin sensation: Secondary | ICD-10-CM

## 2016-07-22 NOTE — Patient Instructions (Addendum)
Elbow Prop (Extension)   Prop body up on elbows for _60___ seconds. Slowly lower it. Repeat ___5_ times. Do __3__ sessions per day.  http://gt2.exer.us/243   Copyright  VHI. All rights reserved.  Back Hyperextension: Using Arms   Lying face down with arms bent, inhale. Then while exhaling, straighten arms. Hold __10__ seconds. Slowly return to starting position. Repeat __5-10__ times per set. Do __1__ sets per session. Do __2-3__ sessions per day. Extension   Copyright  VHI. All rights reserved.  Backward Bend (Standing)   Arch backward to make hollow of back deeper. Hold __10__ seconds. Repeat ___3_ times per set. Do __1__ sets per session. Do ___2-3_ sessions per day.  http://orth.exer.us/178   Copyright  VHI. All rights reserved.     Piriformis Stretch    Lying on back, pull right knee toward opposite shoulder. Hold __30__ seconds. Repeat __3__ times. Do ___2_ sessions per day.  http://gt2.exer.us/258   Copyright  VHI. All rights reserved.    HIP: Hamstrings - Short Sitting    Rest leg on raised surface. Keep knee straight. Lift chest. Hold _30__ seconds. __3_ reps per set, __1_ sets per day, _5_ days per week  Copyright  VHI. All rights reserved.     Sleeping on Back  Place pillow under knees. A pillow with cervical support and a roll around waist are also helpful. Copyright  VHI. All rights reserved.  Sleeping on Side Place pillow between knees. Use cervical support under neck and a roll around waist as needed. Copyright  VHI. All rights reserved.   Sleeping on Stomach   If this is the only desirable sleeping position, place pillow under lower legs, and under stomach or chest as needed.  Posture - Sitting   Sit upright, head facing forward. Try using a roll to support lower back. Keep shoulders relaxed, and avoid rounded back. Keep hips level with knees. Avoid crossing legs for long periods. Stand to Sit / Sit to Stand   To sit: Bend  knees to lower self onto front edge of chair, then scoot back on seat. To stand: Reverse sequence by placing one foot forward, and scoot to front of seat. Use rocking motion to stand up.   Work Height and Reach  Ideal work height is no more than 2 to 4 inches below elbow level when standing, and at elbow level when sitting. Reaching should be limited to arm's length, with elbows slightly bent.  Bending  Bend at hips and knees, not back. Keep feet shoulder-width apart.    Posture - Standing   Good posture is important. Avoid slouching and forward head thrust. Maintain curve in low back and align ears over shoul- ders, hips over ankles.  Alternating Positions   Alternate tasks and change positions frequently to reduce fatigue and muscle tension. Take rest breaks. Computer Work   Position work to Art gallery managerface forward. Use proper work and seat height. Keep shoulders back and down, wrists straight, and elbows at right angles. Use chair that provides full back support. Add footrest and lumbar roll as needed.  Getting Into / Out of Car  Lower self onto seat, scoot back, then bring in one leg at a time. Reverse sequence to get out.  Dressing  Lie on back to pull socks or slacks over feet, or sit and bend leg while keeping back straight.    Housework - Sink  Place one foot on ledge of cabinet under sink when standing at sink for prolonged periods.   Pushing /  Pulling  Pushing is preferable to pulling. Keep back in proper alignment, and use leg muscles to do the work.  Deep Squat   Squat and lift with both arms held against upper trunk. Tighten stomach muscles without holding breath. Use smooth movements to avoid jerking.  Avoid Twisting   Avoid twisting or bending back. Pivot around using foot movements, and bend at knees if needed when reaching for articles.  Carrying Luggage   Distribute weight evenly on both sides. Use a cart whenever possible. Do not twist trunk. Move body  as a unit.   Lifting Principles .Maintain proper posture and head alignment. .Slide object as close as possible before lifting. .Move obstacles out of the way. .Test before lifting; ask for help if too heavy. .Tighten stomach muscles without holding breath. .Use smooth movements; do not jerk. .Use legs to do the work, and pivot with feet. .Distribute the work load symmetrically and close to the center of trunk. .Push instead of pull whenever possible.   Ask For Help   Ask for help and delegate to others when possible. Coordinate your movements when lifting together, and maintain the low back curve.  Log Roll   Lying on back, bend left knee and place left arm across chest. Roll all in one movement to the right. Reverse to roll to the left. Always move as one unit. Housework - Sweeping  Use long-handled equipment to avoid stooping.   Housework - Wiping  Position yourself as close as possible to reach work surface. Avoid straining your back.  Laundry - Unloading Wash   To unload small items at bottom of washer, lift leg opposite to arm being used to reach.  Gardening - Raking  Move close to area to be raked. Use arm movements to do the work. Keep back straight and avoid twisting.     Cart  When reaching into cart with one arm, lift opposite leg to keep back straight.   Getting Into / Out of Bed  Lower self to lie down on one side by raising legs and lowering head at the same time. Use arms to assist moving without twisting. Bend both knees to roll onto back if desired. To sit up, start from lying on side, and use same move-ments in reverse. Housework - Vacuuming  Hold the vacuum with arm held at side. Step back and forth to move it, keeping head up. Avoid twisting.   Laundry - Armed forces training and education officer so that bending and twisting can be avoided.   Laundry - Unloading Dryer  Squat down to reach into clothes dryer or use a reacher.  Gardening -  Weeding / Psychiatric nurse or Kneel. Knee pads may be helpful.

## 2016-07-22 NOTE — Therapy (Signed)
[facility] [address] [place], [place], [address] Phone: [phone]   Fax:  [phone]  Physical Therapy Evaluation and Discharge  Patient Details  Name: Laurie Reed MRN: [id number] Date of Birth: 1985-12-03 Referring Provider: Dr. Dessa Phi  Encounter Date: 07/22/2016      PT End of Session - 07/22/16 1137    Visit Number 1   Number of Visits 1   PT Start Time 1104   PT Stop Time 1145   PT Time Calculation (min) 41 min   Activity Tolerance Patient limited by pain   Behavior During Therapy [facility] for tasks assessed/performed      Past Medical History:  Diagnosis Date  . Acid reflux 2004  . Anxiety 2008  . Asthma 1993   no previous intubations or hospitalizations   . Chlamydia   . Depression 2008   no meds, currenly ok  . Environmental allergies   . Migraine   . Migraines   . Miscarriage   . Ovarian cyst   . Reflux   . Ulcer of the stomach and intestine 2004  . Urinary tract infection     Past Surgical History:  Procedure Laterality Date  . COLONOSCOPY  2004   . MULTIPLE TOOTH EXTRACTIONS  2015    3 teeth removed   . WISDOM TOOTH EXTRACTION  2007    There were no vitals filed for this visit.       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  She noticed immediate pain in her LLE.  She has pain, numbness, weakness in LLE and sometimes in her Lt UE.  Leg buckled last week.  She has been out of work since the injury. Sees MD 08/02/16.      Pertinent History Was denied workers comp.     Limitations Sitting;Lifting;Standing;Walking;House hold activities   How long can you stand comfortably? not long, has to lean    How long can you walk comfortably? not long    Diagnostic tests MRI normal   Currently in Pain? Yes   Pain Score 7    Pain Location Back   Pain Orientation Left   Pain Descriptors / Indicators  Tingling;Dull   Pain Type Acute pain   Pain Onset 1 to 4 weeks ago   Pain Frequency Constant   Aggravating Factors  standing, walking, bending   Pain Relieving Factors meds help a bit, heat    Effect of Pain on Daily Activities unable to work             [facility] PT Assessment - 07/22/16 1112      Assessment   Medical Diagnosis acute L sided LBP with L  sciatica   Referring Provider Dr. Dessa Phi   Onset Date/Surgical Date 06/19/16   Next MD Visit 3/19   Prior Therapy No      Precautions   Precautions None     Balance Screen   Has the patient fallen in the past 6 months Yes   How many times? 1  leg gave way   Has the patient had a decrease in activity level because of a fear of falling?  Yes   Is the patient reluctant to leave their home because of a fear of falling?  Yes     Prior Function   Level of Independence Independent   Vocation Full time employment   Vocation  Requirements CNA      Cognition   Overall Cognitive Status Within Functional Limits for tasks assessed     Observation/Other Assessments   Focus on Therapeutic Outcomes (FOTO)  NT MCD      Sensation   Light Touch Impaired by gross assessment   Additional Comments tingling L leg and L foot      Functional Tests   Functional tests --  unable due to pain      Posture/Postural Control   Posture/Postural Control Postural limitations   Postural Limitations Increased lumbar lordosis;Anterior pelvic tilt;Flexed trunk;Weight shift right     AROM   Lumbar Flexion 75%   Lumbar Extension 90%   Lumbar - Right Side Bend 50%   Lumbar - Left Side Bend 50%   Lumbar - Right Rotation 25%   Lumbar - Left Rotation 25%     Strength   Right Hip Flexion 4/5   Left Hip Flexion 3-/5   Right Knee Flexion 4+/5   Right Knee Extension 4+/5   Left Knee Flexion 3/5   Left Knee Extension 3+/5   Right Ankle Dorsiflexion 4/5   Left Ankle Dorsiflexion 3/5     Palpation   Palpation comment TTP along L paraspinals  and pain in L glutes into L thigh post     Slump test   Findings Positive   Side Left     Straight Leg Raise   Findings Positive   Side  Left     Ambulation/Gait   Ambulation Distance (Feet) 150 Feet   Assistive device None   Gait Pattern Antalgic   Ambulation Surface Level;Indoor                 PT Education - 07/22/16 1136    Education provided Yes   Education Details PT, HEP, anatomy, sciatica    Person(s) Educated Patient   Methods Explanation;Handout   Comprehension Verbalized understanding          PT Short Term Goals - 07/22/16 1153      PT SHORT TERM GOAL #1   Title Pt will be given HEP and info to maintain posture and proper body mechanics   Time 1   Period Days   Status Achieved                  Plan - 07/22/16 1151    Clinical Impression Statement Patient with low complexity of L sided back pain with sciatica.  MRI neg.  Unable to tolerate full eval due to pain.  LLE weak with sensory sx.  Prone releived her leg pain to a degree.  Gave her HEP as well as info on posture and lifting.  Unfortunately PT is not covered by her insurance (MCD) for this diagnosis.     Rehab Potential Excellent   PT Frequency One time visit  Due to finances   PT Treatment/Interventions ADLs/Self Care Home Management;Therapeutic exercise;Cryotherapy   PT Next Visit Plan NA   PT Home Exercise Plan prone progression    Consulted and Agree with Plan of Care Patient      Patient will benefit from skilled therapeutic intervention in order to improve the following deficits and impairments:  Pain, Impaired sensation, Decreased strength, Impaired flexibility, Decreased activity tolerance, Difficulty walking, Increased fascial restricitons, Decreased range of motion  Visit Diagnosis: Acute left-sided low back pain with left-sided sciatica  Muscle weakness (generalized)  Unspecified disturbances of skin sensation     Problem List Patient Active Problem List  Diagnosis Date Noted  . Cervical myelopathy (HCC) 07/01/2016  . Foot swelling 02/18/2016  . Bilateral hand pain 10/30/2015  . Right carpal tunnel syndrome 10/30/2015  . Polymyalgia (HCC) 10/30/2015  . Polyarthralgia 10/30/2015  . Astigmatism 09/18/2015  . Asthma 06/30/2015  . Scalp lesion 06/30/2015  . Vitamin D deficiency 01/22/2015  . Severe obesity (BMI >= 40) (HCC) 01/21/2015  . Foot pain, bilateral 01/21/2015  . Fatigue 01/21/2015  . Acute left-sided low back pain with left-sided sciatica 01/21/2015  . Tinea capitis 01/21/2015  . Anxiety and depression 01/21/2015  . Patellofemoral arthralgia of right knee 01/08/2015  . Ligamentous laxity of right ankle 01/08/2015  . Right knee pain 01/11/2014  . Headache(784.0) 01/11/2014    PAA,JENNIFER 07/22/2016, 11:59 AM  [facility] [address] [place], [place], [address] Phone: [phone]   Fax:  [phone]  Name: Laurie Reed MRN: [id number] Date of Birth: February 04, 1986   Karie MainlandJennifer Paa, PT 07/22/16 11:59 AM Phone: [phone] Fax: [phone]

## 2016-08-02 ENCOUNTER — Ambulatory Visit: Payer: Medicaid Other | Attending: Family Medicine | Admitting: Family Medicine

## 2016-08-02 ENCOUNTER — Encounter: Payer: Self-pay | Admitting: Family Medicine

## 2016-08-02 VITALS — BP 109/74 | HR 95 | Temp 98.2°F | Ht 62.0 in | Wt 239.0 lb

## 2016-08-02 DIAGNOSIS — M5442 Lumbago with sciatica, left side: Secondary | ICD-10-CM | POA: Diagnosis not present

## 2016-08-02 DIAGNOSIS — Z79899 Other long term (current) drug therapy: Secondary | ICD-10-CM | POA: Diagnosis not present

## 2016-08-02 DIAGNOSIS — Z6841 Body Mass Index (BMI) 40.0 and over, adult: Secondary | ICD-10-CM | POA: Insufficient documentation

## 2016-08-02 DIAGNOSIS — M545 Low back pain: Secondary | ICD-10-CM | POA: Diagnosis present

## 2016-08-02 MED ORDER — CANE MISC: 1.0000 | Freq: Every day | 0 refills | Status: DC | PRN

## 2016-08-02 NOTE — Progress Notes (Signed)
Subjective:  Patient ID: Laurie Reed, female    DOB: 02-02-1986  Age: 31 y.o. MRN: [id number]  CC: Back Pain   HPI Laurie Reed has severe obesity, history of pain in multiple areas (right knee pain, bilateral foot, back, wrist, bilateral hands) she presents for    1. Work related injury: she reports that she was injured at work on 06/20/2015. She works at [employer]. 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 She does not recall what activity initiated the pain. She was at work walking down the hall when she developed severe L sided low  back pain radiating down her L, pain was 10/10. Over the following week the pain worsened and started to involve her L neck and arm as well.  Sharp pain. Pain is constant. Pain interferes with sleep. She reported the pain to her employer on 06/24/2016 and was sent to [facility] on 06/24/2016. She was diagnosed with sciatica. She was prescribed prednisone and robaxin. On 06/26/2016 she went back to [facility] and received a Toradol shot.   Today she reports pain is  0/10 at rest. She reports 7-8/10 pain with prolonged standing. She also reports feeling fatigue in her L leg. She reports a fall on 07/15/2016 she went to ED. MRI of cervical and lumbar spine were obtained and negative for acute findings. CT head was obtained and negative for acute findings. She endorses  pain in neck and left shoulder when sitting upright and unsupported. She went to one PT session and had severe pain with therapy.   She is currently taking Vicodin for pain following oral surgery.   Social History  Substance Use Topics  . Smoking status: Never Smoker  . Smokeless tobacco: Never Used  . Alcohol use No    Outpatient Medications Prior to Visit  Medication Sig Dispense Refill  . albuterol (ACCUNEB) 0.63 MG/3ML nebulizer solution Inhale 1 ampule by nebulization every six (6) hours as needed for wheezing.    Marland Kitchen. albuterol  (PROVENTIL HFA;VENTOLIN HFA) 108 (90 Base) MCG/ACT inhaler Inhale 2 puffs into the lungs every 6 (six) hours as needed for wheezing or shortness of breath. 8 g 1  . fluticasone (FLONASE) 50 MCG/ACT nasal spray 1 spray by Each Nare route daily as needed for allergies    . gabapentin (NEURONTIN) 300 MG capsule Take 1 capsule (300 mg total) by mouth 2 (two) times daily. 60 capsule 0  . ibuprofen (ADVIL,MOTRIN) 600 MG tablet Take 600 mg by mouth every 6 (six) hours as needed for moderate pain.   0  . naproxen (NAPROSYN) 500 MG tablet Take 1 tablet (500 mg total) by mouth 2 (two) times daily with a meal. 60 tablet 0  . traMADol (ULTRAM) 50 MG tablet Take 2 tablets (100 mg total) by mouth every 8 (eight) hours as needed. (Patient taking differently: Take 100 mg by mouth every 8 (eight) hours as needed for moderate pain. ) 90 tablet 0  . methylPREDNISolone (MEDROL DOSEPAK) 4 MG TBPK tablet As directed (Patient not taking: Reported on 08/02/2016) 21 tablet 0  . topiramate (TOPAMAX) 25 MG tablet Take 1 tablet (25 mg total) by mouth daily. (Patient not taking: Reported on 07/12/2016) 30 tablet 11   No facility-administered medications prior to visit.     ROS Review of Systems  Constitutional: Positive for fatigue. Negative for chills and fever.  Eyes: Negative for visual disturbance.  Respiratory: Negative for shortness  of breath.   Cardiovascular: Negative for chest pain.  Gastrointestinal: Negative for abdominal pain and blood in stool.  Musculoskeletal: Positive for back pain, gait problem, myalgias, neck pain and neck stiffness. Negative for arthralgias and joint swelling.  Skin: Negative for rash.  Allergic/Immunologic: Negative for immunocompromised state.  Hematological: Negative for adenopathy. Does not bruise/bleed easily.  Psychiatric/Behavioral: Positive for sleep disturbance. Negative for dysphoric mood and suicidal ideas.    Objective:  BP 109/74   Pulse 95   Temp 98.2 F (36.8 C)  (Oral)   Ht 5\' 2"  (1.575 m)   Wt 239 lb (108.4 kg)   LMP 07/12/2016   SpO2 99%   BMI 43.71 kg/m   BP/Weight 08/02/2016 07/15/2016 07/12/2016  Systolic BP 109 106 113  Diastolic BP 74 68 80  Wt. (Lbs) 239 - 238.2  BMI 43.71 - 43.57    Physical Exam  Constitutional: She is oriented to person, place, and time. She appears well-developed and well-nourished. No distress.  Obese   HENT:  Head: Normocephalic and atraumatic.  Neck: Normal range of motion. Neck supple. Muscular tenderness (L side ) present. No spinous process tenderness present. No neck rigidity. No edema, no erythema and normal range of motion present.  Cardiovascular: Normal rate, regular rhythm, normal heart sounds and intact distal pulses.   Pulmonary/Chest: Effort normal and breath sounds normal.  Musculoskeletal: She exhibits no edema.       Lumbar back: She exhibits decreased range of motion, tenderness, pain and spasm. She exhibits no bony tenderness, no swelling, no edema, no deformity, no laceration and normal pulse.     Neurological: She is alert and oriented to person, place, and time. Gait abnormal.  Skin: Skin is warm and dry. No rash noted.  Psychiatric: She has a normal mood and affect.     Assessment & Plan:  Reighn was seen today for back pain.  Diagnoses and all orders for this visit:  Acute left-sided low back pain with left-sided sciatica -     Ambulatory referral to Orthopedic Surgery   There are no diagnoses linked to this encounter.  No orders of the defined types were placed in this encounter.   Follow-up: Return in about 4 weeks (around 08/30/2016) for pap smear .   Dessa Phi MD

## 2016-08-02 NOTE — Patient Instructions (Addendum)
Laurie Reed was seen today for back pain.  Diagnoses and all orders for this visit:  Acute left-sided low back pain with left-sided sciatica -     Ambulatory referral to Orthopedic Surgery   f/u in 4 weeks for pap smear   Dr. Armen PickupFunches

## 2016-08-02 NOTE — Addendum Note (Signed)
Addended by: Dessa PhiFUNCHES, Marylu Dudenhoeffer on: 08/02/2016 03:00 PM   Modules accepted: Orders

## 2016-08-02 NOTE — Assessment & Plan Note (Signed)
Improving Still with pain and sensation of leg giving on on L side Plan: Ortho referral Rx for cane She requested letter for housing, provided

## 2016-09-06 ENCOUNTER — Encounter: Payer: Self-pay | Admitting: Family Medicine

## 2016-09-06 ENCOUNTER — Ambulatory Visit: Payer: Medicaid Other | Attending: Family Medicine | Admitting: Family Medicine

## 2016-09-06 VITALS — BP 116/82 | HR 96 | Temp 98.1°F | Ht 62.0 in | Wt 248.0 lb

## 2016-09-06 DIAGNOSIS — Z79899 Other long term (current) drug therapy: Secondary | ICD-10-CM | POA: Insufficient documentation

## 2016-09-06 DIAGNOSIS — G8929 Other chronic pain: Secondary | ICD-10-CM | POA: Diagnosis not present

## 2016-09-06 DIAGNOSIS — Z6841 Body Mass Index (BMI) 40.0 and over, adult: Secondary | ICD-10-CM | POA: Diagnosis not present

## 2016-09-06 DIAGNOSIS — J45909 Unspecified asthma, uncomplicated: Secondary | ICD-10-CM | POA: Diagnosis not present

## 2016-09-06 DIAGNOSIS — L989 Disorder of the skin and subcutaneous tissue, unspecified: Secondary | ICD-10-CM | POA: Insufficient documentation

## 2016-09-06 MED ORDER — SULFAMETHOXAZOLE-TRIMETHOPRIM 800-160 MG PO TABS: 1.0000 | ORAL_TABLET | Freq: Two times a day (BID) | ORAL | 0 refills | Status: DC

## 2016-09-06 MED ORDER — BUPROPION HCL ER (SR) 150 MG PO TB12: 150.0000 mg | ORAL_TABLET | Freq: Two times a day (BID) | ORAL | 5 refills | Status: DC

## 2016-09-06 NOTE — Assessment & Plan Note (Signed)
Obesity in setting of overeating  Plan: Wellbutrin 3 short term goal sets Eat breakfast Drink diet soda only No eating after 9 PM

## 2016-09-06 NOTE — Assessment & Plan Note (Signed)
Bactrim DS for scalp lesions

## 2016-09-06 NOTE — Progress Notes (Signed)
Subjective:  Patient ID: Laurie Reed, female    DOB: [birth year]  Age: 31 y.o. MRN: [id number]  CC: Obesity and Rash   HPI Laurie Reed has obesity, asthma, chronic pain  presents for   1. Pap: declines pap today. She reports she started her menstrual period and would like to wait.   2. Obesity: distressed by obesity. Desires to lose weight. Admits to overeating late at night. Skipping breakfast. Snacking during the day. Drinking soda. She would like to lose 50 lbs and get below 200 lbs which is what she weighed prior to her first pregnancy in 2013.   3. Scalp rash: x one year. Pruritic. Took clindamycin with only mild improvement. Has bumps with pus in her scalp.   Social History  Substance Use Topics  . Smoking status: Never Smoker  . Smokeless tobacco: Never Used  . Alcohol use No    Outpatient Medications Prior to Visit  Medication Sig Dispense Refill  . albuterol (ACCUNEB) 0.63 MG/3ML nebulizer solution Inhale 1 ampule by nebulization every six (6) hours as needed for wheezing.    Marland Kitchen albuterol (PROVENTIL HFA;VENTOLIN HFA) 108 (90 Base) MCG/ACT inhaler Inhale 2 puffs into the lungs every 6 (six) hours as needed for wheezing or shortness of breath. 8 g 1  . fluticasone (FLONASE) 50 MCG/ACT nasal spray 1 spray by Each Nare route daily as needed for allergies    . gabapentin (NEURONTIN) 300 MG capsule Take 1 capsule (300 mg total) by mouth 2 (two) times daily. 60 capsule 0  . ibuprofen (ADVIL,MOTRIN) 600 MG tablet Take 600 mg by mouth every 6 (six) hours as needed for moderate pain.   0  . naproxen (NAPROSYN) 500 MG tablet Take 1 tablet (500 mg total) by mouth 2 (two) times daily with a meal. 60 tablet 0  . topiramate (TOPAMAX) 25 MG tablet Take 1 tablet (25 mg total) by mouth daily. 30 tablet 11  . traMADol (ULTRAM) 50 MG tablet Take 2 tablets (100 mg total) by mouth every 8 (eight) hours as needed. (Patient taking differently: Take 100 mg by mouth every 8 (eight) hours  as needed for moderate pain. ) 90 tablet 0  . Misc. Devices (CANE) MISC 1 each by Does not apply route daily as needed. (Patient not taking: Reported on 09/06/2016) 1 each 0   No facility-administered medications prior to visit.     ROS Review of Systems  Constitutional: Negative for chills and fever.  Eyes: Negative for visual disturbance.  Respiratory: Negative for shortness of breath.   Cardiovascular: Negative for chest pain.  Gastrointestinal: Negative for abdominal pain and blood in stool.  Musculoskeletal: Positive for arthralgias and back pain.  Skin: Positive for rash.  Allergic/Immunologic: Negative for immunocompromised state.  Hematological: Negative for adenopathy. Does not bruise/bleed easily.  Psychiatric/Behavioral: Negative for dysphoric mood and suicidal ideas.    Objective:  BP 116/82   Pulse 96   Temp 98.1 F (36.7 C) (Oral)   Ht  (1.575 m)   Wt 248 lb (112.5 kg)   LMP 09/04/2016   SpO2 100%   BMI 45.36 kg/m   BP/Weight 09/06/2016 08/02/2016 07/15/2016  Systolic BP 116 109 106  Diastolic BP 82 74 68  Wt. (Lbs) 248 239 -  BMI 45.36 43.71 -    Physical Exam  Constitutional: She is oriented to person, place, and time. She appears well-developed and well-nourished. No distress.  Obese   HENT:  Head: Normocephalic and atraumatic.    Cardiovascular:  Normal rate, regular rhythm, normal heart sounds and intact distal pulses.   Pulmonary/Chest: Effort normal and breath sounds normal.  Musculoskeletal: She exhibits no edema.  Neurological: She is alert and oriented to person, place, and time.  Skin: Skin is warm and dry. No rash noted.  Psychiatric: She has a normal mood and affect.     Assessment & Plan:  Jesyka was seen today for obesity and rash.  Diagnoses and all orders for this visit:  Scalp lesion -     sulfamethoxazole-trimethoprim (BACTRIM DS,SEPTRA DS) 800-160 MG tablet; Take 1 tablet by mouth 2 (two) times daily.  Severe obesity (BMI  >= 40) (HCC) -     buPROPion (WELLBUTRIN SR) 150 MG 12 hr tablet; Take 1 tablet (150 mg total) by mouth 2 (two) times daily.   There are no diagnoses linked to this encounter.  No orders of the defined types were placed in this encounter.   Follow-up: Return in about 6 weeks (around 10/18/2016) for pap smear and obesity .   Dessa Phi MD

## 2016-09-06 NOTE — Patient Instructions (Addendum)
  Diagnoses and all orders for this visit:  Scalp lesion -     sulfamethoxazole-trimethoprim (BACTRIM DS,SEPTRA DS) 800-160 MG tablet; Take 1 tablet by mouth 2 (two) times daily.  Severe obesity (BMI >= 40) (HCC) -     buPROPion (WELLBUTRIN SR) 150 MG 12 hr tablet; Take 1 tablet (150 mg total) by mouth 2 (two) times daily.   Wellbutrin SR (bupropion) is prescribed as 150 mg BID. Start for the first few days at one pill daily in the morning; if no side effects, take the second pill around 2PM to avoid trouble sleeping. Call if any persisting side effects.  Short term weight loss goal  f/u in 6 weeks for pap smear and weight check   Dr. Armen Pickup

## 2016-09-10 ENCOUNTER — Ambulatory Visit (INDEPENDENT_AMBULATORY_CARE_PROVIDER_SITE_OTHER): Payer: Self-pay | Admitting: Orthopaedic Surgery

## 2016-09-15 ENCOUNTER — Other Ambulatory Visit: Payer: Self-pay | Admitting: Orthopedic Surgery

## 2016-09-15 DIAGNOSIS — M533 Sacrococcygeal disorders, not elsewhere classified: Secondary | ICD-10-CM

## 2016-09-22 ENCOUNTER — Ambulatory Visit
Admission: RE | Admit: 2016-09-22 | Discharge: 2016-09-22 | Disposition: A | Discharge status: Home / Self Care | Payer: Medicaid Other | Source: Ambulatory Visit | Attending: Orthopedic Surgery | Admitting: Orthopedic Surgery

## 2016-09-22 DIAGNOSIS — M533 Sacrococcygeal disorders, not elsewhere classified: Secondary | ICD-10-CM

## 2016-09-29 ENCOUNTER — Encounter: Payer: Self-pay | Admitting: Family Medicine

## 2016-10-21 ENCOUNTER — Ambulatory Visit: Payer: Medicaid Other | Attending: Family Medicine | Admitting: Family Medicine

## 2016-10-21 ENCOUNTER — Other Ambulatory Visit (HOSPITAL_COMMUNITY)
Admission: RE | Admit: 2016-10-21 | Discharge: 2016-10-21 | Disposition: A | Discharge status: Home / Self Care | Payer: Medicaid Other | Source: Ambulatory Visit | Attending: Family Medicine | Admitting: Family Medicine

## 2016-10-21 ENCOUNTER — Encounter: Payer: Self-pay | Admitting: Family Medicine

## 2016-10-21 VITALS — BP 107/69 | HR 96 | Temp 98.0°F | Wt 249.4 lb

## 2016-10-21 DIAGNOSIS — M25551 Pain in right hip: Secondary | ICD-10-CM | POA: Diagnosis not present

## 2016-10-21 DIAGNOSIS — Z124 Encounter for screening for malignant neoplasm of cervix: Secondary | ICD-10-CM | POA: Diagnosis present

## 2016-10-21 DIAGNOSIS — E669 Obesity, unspecified: Secondary | ICD-10-CM | POA: Diagnosis not present

## 2016-10-21 DIAGNOSIS — J45909 Unspecified asthma, uncomplicated: Secondary | ICD-10-CM | POA: Insufficient documentation

## 2016-10-21 DIAGNOSIS — Z7951 Long term (current) use of inhaled steroids: Secondary | ICD-10-CM | POA: Insufficient documentation

## 2016-10-21 DIAGNOSIS — Z6841 Body Mass Index (BMI) 40.0 and over, adult: Secondary | ICD-10-CM | POA: Insufficient documentation

## 2016-10-21 DIAGNOSIS — G8929 Other chronic pain: Secondary | ICD-10-CM | POA: Diagnosis not present

## 2016-10-21 DIAGNOSIS — L989 Disorder of the skin and subcutaneous tissue, unspecified: Secondary | ICD-10-CM

## 2016-10-21 DIAGNOSIS — Z79899 Other long term (current) drug therapy: Secondary | ICD-10-CM | POA: Insufficient documentation

## 2016-10-21 NOTE — Patient Instructions (Addendum)
Laurie Reed was seen today for gynecologic exam.  Diagnoses and all orders for this visit:  Pap smear for cervical cancer screening -     Cytology - PAP  Scalp lesion -     Ambulatory referral to Dermatology  you will be called with pap results    Keep track of weight loss goal from last visit  Eat breakfast Diet soda only No meals or snacks after 9 PM   F/u in 6 weeks for R hip pain and weight loss     Dr. Armen PickupFunches

## 2016-10-21 NOTE — Assessment & Plan Note (Signed)
Persistent papules with some pustules non responsive to clindamycin and bactrim Plan: Dermatology referral

## 2016-10-21 NOTE — Progress Notes (Signed)
Subjective:  Patient ID: Laurie Reed, female    DOB: 06-14-1985  Age: 31 y.o. MRN: [id number]  CC: Gynecologic Exam   HPI Laurie Reed has obesity, asthma, chronic pain  presents for   1. Pap smear: here for screening pap smear. She is having regular periods. Periods are not heavy or prolonged. She denies vaginal discharge.  2. Scalp lesions: she still has knots in her scalp. Some drain pus. She has taken a course of clindamycin and bactrim without significant improvement. No fever or chills.    Social History  Substance Use Topics  . Smoking status: Never Smoker  . Smokeless tobacco: Never Used  . Alcohol use No    Outpatient Medications Prior to Visit  Medication Sig Dispense Refill  . albuterol (ACCUNEB) 0.63 MG/3ML nebulizer solution Inhale 1 ampule by nebulization every six (6) hours as needed for wheezing.    Marland Kitchen. albuterol (PROVENTIL HFA;VENTOLIN HFA) 108 (90 Base) MCG/ACT inhaler Inhale 2 puffs into the lungs every 6 (six) hours as needed for wheezing or shortness of breath. 8 g 1  . buPROPion (WELLBUTRIN SR) 150 MG 12 hr tablet Take 1 tablet (150 mg total) by mouth 2 (two) times daily. 60 tablet 5  . fluticasone (FLONASE) 50 MCG/ACT nasal spray 1 spray by Each Nare route daily as needed for allergies    . gabapentin (NEURONTIN) 300 MG capsule Take 1 capsule (300 mg total) by mouth 2 (two) times daily. 60 capsule 0  . ibuprofen (ADVIL,MOTRIN) 600 MG tablet Take 600 mg by mouth every 6 (six) hours as needed for moderate pain.   0  . Misc. Devices (CANE) MISC 1 each by Does not apply route daily as needed. (Patient not taking: Reported on 09/06/2016) 1 each 0  . naproxen (NAPROSYN) 500 MG tablet Take 1 tablet (500 mg total) by mouth 2 (two) times daily with a meal. 60 tablet 0  . sulfamethoxazole-trimethoprim (BACTRIM DS,SEPTRA DS) 800-160 MG tablet Take 1 tablet by mouth 2 (two) times daily. 20 tablet 0  . topiramate (TOPAMAX) 25 MG tablet Take 1 tablet (25 mg total)  by mouth daily. 30 tablet 11  . traMADol (ULTRAM) 50 MG tablet Take 2 tablets (100 mg total) by mouth every 8 (eight) hours as needed. (Patient taking differently: Take 100 mg by mouth every 8 (eight) hours as needed for moderate pain. ) 90 tablet 0   No facility-administered medications prior to visit.     ROS Review of Systems  Constitutional: Negative for chills and fever.  Eyes: Negative for visual disturbance.  Respiratory: Negative for shortness of breath.   Cardiovascular: Negative for chest pain.  Gastrointestinal: Negative for abdominal pain and blood in stool.  Musculoskeletal: Positive for arthralgias and back pain.  Skin: Positive for rash.  Allergic/Immunologic: Negative for immunocompromised state.  Hematological: Negative for adenopathy. Does not bruise/bleed easily.  Psychiatric/Behavioral: Negative for dysphoric mood and suicidal ideas.    Objective:  BP 107/69   Pulse 96   Temp 98 F (36.7 C) (Oral)   Wt 249 lb 6.4 oz (113.1 kg)   SpO2 100%   BMI 45.62 kg/m   BP/Weight 09/06/2016 08/02/2016 07/15/2016  Systolic BP 116 109 106  Diastolic BP 82 74 68  Wt. (Lbs) 248 239 -  BMI 45.36 43.71 -    Physical Exam  Constitutional: She is oriented to person, place, and time. She appears well-developed and well-nourished. No distress.  Obese   HENT:  Head: Normocephalic and atraumatic.  Cardiovascular: Normal rate, regular rhythm, normal heart sounds and intact distal pulses.   Pulmonary/Chest: Effort normal and breath sounds normal.  Genitourinary: Vagina normal and uterus normal. Pelvic exam was performed with patient prone. There is no rash, tenderness or lesion on the right labia. There is no rash, tenderness or lesion on the left labia. Cervix exhibits no motion tenderness, no discharge and no friability.  Musculoskeletal: She exhibits no edema.  Lymphadenopathy:       Right: No inguinal adenopathy present.       Left: No inguinal adenopathy present.    Neurological: She is alert and oriented to person, place, and time.  Skin: Skin is warm and dry. No rash noted.  Psychiatric: She has a normal mood and affect.     Assessment & Plan:  Laurie Reed was seen today for gynecologic exam.  Diagnoses and all orders for this visit:  Pap smear for cervical cancer screening -     Cytology - PAP  Scalp lesion -     Ambulatory referral to Dermatology   There are no diagnoses linked to this encounter.  No orders of the defined types were placed in this encounter.   Follow-up: Return in about 6 weeks (around 12/02/2016) for R hip pain.   Dessa Phi MD

## 2016-10-22 ENCOUNTER — Other Ambulatory Visit: Payer: Self-pay | Admitting: Family Medicine

## 2016-10-22 LAB — CERVICOVAGINAL ANCILLARY ONLY: Wet Prep (BD Affirm): POSITIVE — AB

## 2016-10-22 MED ORDER — METRONIDAZOLE 0.75 % VA GEL: 1.0000 | Freq: Every day | VAGINAL | 0 refills | Status: DC

## 2016-10-25 LAB — CYTOLOGY - PAP
Chlamydia: NEGATIVE
DIAGNOSIS: NEGATIVE
HPV: NOT DETECTED
Neisseria Gonorrhea: NEGATIVE

## 2016-11-01 DIAGNOSIS — R51 Headache: Secondary | ICD-10-CM

## 2016-11-01 DIAGNOSIS — J302 Other seasonal allergic rhinitis: Secondary | ICD-10-CM | POA: Insufficient documentation

## 2016-11-01 DIAGNOSIS — R519 Headache, unspecified: Secondary | ICD-10-CM | POA: Insufficient documentation

## 2016-11-01 DIAGNOSIS — J343 Hypertrophy of nasal turbinates: Secondary | ICD-10-CM | POA: Insufficient documentation

## 2016-11-10 ENCOUNTER — Telehealth: Payer: Self-pay

## 2016-11-10 NOTE — Telephone Encounter (Signed)
Pt was called and informed of lab results. 

## 2016-12-02 ENCOUNTER — Encounter: Payer: Self-pay | Admitting: Family Medicine

## 2016-12-02 ENCOUNTER — Ambulatory Visit: Payer: Medicaid Other | Attending: Family Medicine | Admitting: Family Medicine

## 2016-12-02 VITALS — BP 97/67 | HR 76 | Temp 98.1°F | Ht 62.0 in | Wt 245.4 lb

## 2016-12-02 DIAGNOSIS — G959 Disease of spinal cord, unspecified: Secondary | ICD-10-CM | POA: Diagnosis not present

## 2016-12-02 DIAGNOSIS — N898 Other specified noninflammatory disorders of vagina: Secondary | ICD-10-CM

## 2016-12-02 DIAGNOSIS — J4520 Mild intermittent asthma, uncomplicated: Secondary | ICD-10-CM | POA: Diagnosis not present

## 2016-12-02 DIAGNOSIS — M25561 Pain in right knee: Secondary | ICD-10-CM | POA: Diagnosis present

## 2016-12-02 DIAGNOSIS — L089 Local infection of the skin and subcutaneous tissue, unspecified: Secondary | ICD-10-CM | POA: Diagnosis not present

## 2016-12-02 DIAGNOSIS — Z7951 Long term (current) use of inhaled steroids: Secondary | ICD-10-CM | POA: Insufficient documentation

## 2016-12-02 DIAGNOSIS — Z6841 Body Mass Index (BMI) 40.0 and over, adult: Secondary | ICD-10-CM | POA: Diagnosis not present

## 2016-12-02 DIAGNOSIS — Z79899 Other long term (current) drug therapy: Secondary | ICD-10-CM | POA: Insufficient documentation

## 2016-12-02 DIAGNOSIS — L298 Other pruritus: Secondary | ICD-10-CM | POA: Diagnosis not present

## 2016-12-02 DIAGNOSIS — M5442 Lumbago with sciatica, left side: Secondary | ICD-10-CM | POA: Insufficient documentation

## 2016-12-02 DIAGNOSIS — L989 Disorder of the skin and subcutaneous tissue, unspecified: Secondary | ICD-10-CM

## 2016-12-02 DIAGNOSIS — M533 Sacrococcygeal disorders, not elsewhere classified: Secondary | ICD-10-CM

## 2016-12-02 MED ORDER — FLUTICASONE PROPIONATE 50 MCG/ACT NA SUSP: 1.0000 | Freq: Every day | NASAL | 5 refills | Status: DC

## 2016-12-02 MED ORDER — METRONIDAZOLE 0.75 % EX CREA: TOPICAL_CREAM | Freq: Two times a day (BID) | CUTANEOUS | 1 refills | Status: DC

## 2016-12-02 MED ORDER — CLOTRIMAZOLE 2 % VA CREA: 1.0000 | TOPICAL_CREAM | Freq: Every day | VAGINAL | 0 refills | Status: DC

## 2016-12-02 MED ORDER — NAPROXEN 500 MG PO TABS: 500.0000 mg | ORAL_TABLET | Freq: Two times a day (BID) | ORAL | 0 refills | Status: DC

## 2016-12-02 MED ORDER — ALBUTEROL SULFATE HFA 108 (90 BASE) MCG/ACT IN AERS: 2.0000 | INHALATION_SPRAY | Freq: Four times a day (QID) | RESPIRATORY_TRACT | 5 refills | Status: DC | PRN

## 2016-12-02 MED ORDER — GABAPENTIN 300 MG PO CAPS: 300.0000 mg | ORAL_CAPSULE | Freq: Two times a day (BID) | ORAL | 5 refills | Status: DC

## 2016-12-02 MED ORDER — METRONIDAZOLE 1 % EX GEL: Freq: Every day | CUTANEOUS | 0 refills | Status: DC

## 2016-12-02 MED ORDER — CYCLOBENZAPRINE HCL 10 MG PO TABS: 10.0000 mg | ORAL_TABLET | Freq: Three times a day (TID) | ORAL | 0 refills | Status: DC | PRN

## 2016-12-02 MED ORDER — ALBUTEROL SULFATE 0.63 MG/3ML IN NEBU: INHALATION_SOLUTION | RESPIRATORY_TRACT | 5 refills | Status: DC

## 2016-12-02 MED ORDER — BUPROPION HCL ER (SR) 150 MG PO TB12: 150.0000 mg | ORAL_TABLET | Freq: Two times a day (BID) | ORAL | 5 refills | Status: DC

## 2016-12-02 MED ORDER — TRAMADOL HCL 50 MG PO TABS: 50.0000 mg | ORAL_TABLET | Freq: Three times a day (TID) | ORAL | 2 refills | Status: DC | PRN

## 2016-12-02 MED ORDER — TRAMADOL HCL 50 MG PO TABS: 100.0000 mg | ORAL_TABLET | Freq: Three times a day (TID) | ORAL | 0 refills | Status: DC | PRN

## 2016-12-02 NOTE — Progress Notes (Signed)
Subjective:  Patient ID: Laurie Reed, female    DOB: 1985/11/01  Age: 31 y.o. MRN: [id number]  CC: Hip Pain   HPI Laurie Reed has severe obesity, history of pain in multiple areas (right knee pain, bilateral foot, back, wrist, bilateral hands) she presents for    1. Work related injury: she reports that she was injured at work on 06/20/2015. She works at [employer]. 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 She does not recall what activity initiated the pain. She was at work walking down the hall when she developed severe L sided low  back pain radiating down her L, pain was 10/10. Over the following week the pain worsened and started to involve her L neck and arm as well.  Sharp pain. Pain is constant. Pain interferes with sleep. She reported the pain to her employer on 06/24/2016 and was sent to [facility] on 06/24/2016. She was diagnosed with sciatica. She was prescribed prednisone and robaxin. On 06/26/2016 she went back to [facility] and received a Toradol shot. Her pain persisted.  Lumbar MRI was obtained  and negative for neurologic compression. 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  On 09/03/2016 she has been evaluated by Dr. Estill BambergMark Dumonski with [facility]. History of exam was suspicious for left sacroiliac joint dysfunction.  On 09/22/2016, she underwent diagnostic anesthesia only CT guided injection into the left sacroiliac joint with concordant transient symptom resolution for one day. She has been offered therapeutic SI joint injection but declined due to pain with the diagnostic injection. She has opted for radiofrequency ablation. She has scheduled for later this month.  Today she reports pain bothers her sometimes, but has improved. She is currently having minimal pain at 3/10.   2. Rash: she has persistent rash on scalp. Also  some pustules on face and neck. Pruritic. She has dermatology assessment neck month.   3. Upper back pain: left sided. Soreness. Trouble sleeping. No recent trauma. No rash or skin changes. She has history of neck and low back pain. Last year she had elevated CRP of 2.0 and sed rate of 45, ANA and rheumatoid factor were negative.   4. Vaginal itching: after periods. No discharge. No genital sores. She completed flagyl last month for bacterial vaginosis.   Social History  Substance Use Topics  . Smoking status: Never Smoker  . Smokeless tobacco: Never Used  . Alcohol use No    Outpatient Medications Prior to Visit  Medication Sig Dispense Refill  . albuterol (ACCUNEB) 0.63 MG/3ML nebulizer solution Inhale 1 ampule by nebulization every six (6) hours as needed for wheezing.    Marland Kitchen. albuterol (PROVENTIL HFA;VENTOLIN HFA) 108 (90 Base) MCG/ACT inhaler Inhale 2 puffs into the lungs every 6 (six) hours as needed for wheezing or shortness of breath. 8 g 1  . buPROPion (WELLBUTRIN SR) 150 MG 12 hr tablet Take 1 tablet (150 mg total) by mouth 2 (two) times daily. 60 tablet 5  . fluticasone (FLONASE) 50 MCG/ACT nasal spray 1 spray by Each Nare route daily as needed for allergies    . gabapentin (NEURONTIN) 300 MG capsule Take 1 capsule (300 mg total) by mouth 2 (two) times daily. 60 capsule 0  . ibuprofen (ADVIL,MOTRIN) 600 MG tablet Take 600 mg by mouth every 6 (six) hours as needed for moderate pain.  0  . metroNIDAZOLE (METROGEL VAGINAL) 0.75 % vaginal gel Place 1 Applicatorful vaginally at bedtime. 70 g 0  . Misc. Devices (CANE) MISC 1 each by Does not apply route daily as needed. (Patient not taking: Reported on 09/06/2016) 1 each 0  . naproxen (NAPROSYN) 500 MG tablet Take 1 tablet (500 mg total) by mouth 2 (two) times daily with a meal. 60 tablet 0  . sulfamethoxazole-trimethoprim (BACTRIM DS,SEPTRA DS) 800-160 MG tablet Take 1 tablet by mouth 2 (two) times daily. 20 tablet 0  . topiramate  (TOPAMAX) 25 MG tablet Take 1 tablet (25 mg total) by mouth daily. 30 tablet 11  . traMADol (ULTRAM) 50 MG tablet Take 2 tablets (100 mg total) by mouth every 8 (eight) hours as needed. (Patient taking differently: Take 100 mg by mouth every 8 (eight) hours as needed for moderate pain. ) 90 tablet 0   No facility-administered medications prior to visit.     ROS Review of Systems  Constitutional: Positive for fatigue. Negative for chills and fever.  Eyes: Negative for visual disturbance.  Respiratory: Negative for shortness of breath.   Cardiovascular: Negative for chest pain.  Gastrointestinal: Negative for abdominal pain and blood in stool.  Musculoskeletal: Positive for back pain, gait problem, myalgias, neck pain and neck stiffness. Negative for arthralgias and joint swelling.  Skin: Positive for rash.  Allergic/Immunologic: Negative for immunocompromised state.  Hematological: Negative for adenopathy. Does not bruise/bleed easily.  Psychiatric/Behavioral: Positive for sleep disturbance. Negative for dysphoric mood and suicidal ideas.    Objective:  BP 97/67   Pulse 76   Temp 98.1 F (36.7 C) (Oral)   Ht 5\' 2"  (1.575 m)   Wt 245 lb 6.4 oz (111.3 kg)   LMP 11/26/2016   SpO2 99%   BMI 44.88 kg/m   BP/Weight 12/02/2016 10/21/2016 09/06/2016  Systolic BP 97 107 116  Diastolic BP 67 69 82  Wt. (Lbs) 245.4 249.4 248  BMI 44.88 45.62 45.36    Physical Exam  Constitutional: She is oriented to person, place, and time. She appears well-developed and well-nourished. No distress.  Obese   HENT:  Head: Normocephalic and atraumatic.  Neck: Normal range of motion. Neck supple. Muscular tenderness (L side ) present. No spinous process tenderness present. No neck rigidity. No edema, no erythema and normal range of motion present.  Cardiovascular: Normal rate, regular rhythm, normal heart sounds and intact distal pulses.   Pulmonary/Chest: Effort normal and breath sounds normal.    Musculoskeletal: She exhibits no edema.       Lumbar back: She exhibits decreased range of motion, tenderness, pain and spasm. She exhibits no bony tenderness, no swelling, no edema, no deformity, no laceration and normal pulse.     Neurological: She is alert and oriented to person, place, and time. Gait abnormal.  Skin: Skin is warm and dry. Rash noted. Rash is pustular.     Psychiatric: She has a normal mood and affect.     Assessment & Plan:  Noora was seen today for hip pain.  Diagnoses and all orders for this visit:  Sacroiliac joint dysfunction of left side -     Discontinue: traMADol (ULTRAM) 50 MG tablet; Take 2 tablets (100 mg total) by mouth every 8 (eight) hours as needed. -     naproxen (NAPROSYN) 500 MG tablet; Take 1 tablet (500 mg total) by mouth 2 (two) times daily with a meal. -     gabapentin (NEURONTIN) 300 MG capsule; Take 1 capsule (  300 mg total) by mouth 2 (two) times daily. -     traMADol (ULTRAM) 50 MG tablet; Take 1-2 tablets (50-100 mg total) by mouth 3 (three) times daily as needed.  Severe obesity (BMI >= 40) (HCC) -     buPROPion (WELLBUTRIN SR) 150 MG 12 hr tablet; Take 1 tablet (150 mg total) by mouth 2 (two) times daily.  Acute left-sided low back pain with left-sided sciatica  Cervical myelopathy (HCC) -     cyclobenzaprine (FLEXERIL) 10 MG tablet; Take 1 tablet (10 mg total) by mouth 3 (three) times daily as needed for muscle spasms.  Mild intermittent asthma without complication -     albuterol (ACCUNEB) 0.63 MG/3ML nebulizer solution; Inhale 1 ampule by nebulization every six (6) hours as needed for wheezing. -     albuterol (PROVENTIL HFA;VENTOLIN HFA) 108 (90 Base) MCG/ACT inhaler; Inhale 2 puffs into the lungs every 6 (six) hours as needed for wheezing or shortness of breath. -     fluticasone (FLONASE) 50 MCG/ACT nasal spray; Place 1 spray into both nostrils daily.  Skin pustule -     Discontinue: metroNIDAZOLE (METROGEL) 1 % gel; Apply  topically daily. -     metroNIDAZOLE (METROCREAM) 0.75 % cream; Apply topically 2 (two) times daily.  Vagina itching -     clotrimazole (GYNE-LOTRIMIN 3) 2 % vaginal cream; Place 1 Applicatorful vaginally at bedtime.   There are no diagnoses linked to this encounter.  No orders of the defined types were placed in this encounter.   Follow-up: Return in about 3 months (around 03/04/2017) for back pain, neck pain and rash .   Dessa Phi MD

## 2016-12-02 NOTE — Assessment & Plan Note (Signed)
Left upper shoulder pain Flexeril ordered

## 2016-12-02 NOTE — Assessment & Plan Note (Signed)
metro cream ordered Patient awaiting dermatology appointment

## 2016-12-02 NOTE — Patient Instructions (Addendum)
Kerstie was seen today for hip pain.  Diagnoses and all orders for this visit:  Sacroiliac joint dysfunction of left side -     Discontinue: traMADol (ULTRAM) 50 MG tablet; Take 2 tablets (100 mg total) by mouth every 8 (eight) hours as needed. -     naproxen (NAPROSYN) 500 MG tablet; Take 1 tablet (500 mg total) by mouth 2 (two) times daily with a meal. -     gabapentin (NEURONTIN) 300 MG capsule; Take 1 capsule (300 mg total) by mouth 2 (two) times daily. -     traMADol (ULTRAM) 50 MG tablet; Take 1-2 tablets (50-100 mg total) by mouth 3 (three) times daily as needed.  Severe obesity (BMI >= 40) (HCC) -     buPROPion (WELLBUTRIN SR) 150 MG 12 hr tablet; Take 1 tablet (150 mg total) by mouth 2 (two) times daily.  Acute left-sided low back pain with left-sided sciatica  Cervical myelopathy (HCC) -     cyclobenzaprine (FLEXERIL) 10 MG tablet; Take 1 tablet (10 mg total) by mouth 3 (three) times daily as needed for muscle spasms.  Mild intermittent asthma without complication -     albuterol (ACCUNEB) 0.63 MG/3ML nebulizer solution; Inhale 1 ampule by nebulization every six (6) hours as needed for wheezing. -     albuterol (PROVENTIL HFA;VENTOLIN HFA) 108 (90 Base) MCG/ACT inhaler; Inhale 2 puffs into the lungs every 6 (six) hours as needed for wheezing or shortness of breath. -     fluticasone (FLONASE) 50 MCG/ACT nasal spray; Place 1 spray into both nostrils daily.  Skin pustule -     metroNIDAZOLE (METROGEL) 1 % gel; Apply topically daily.  Vagina itching -     clotrimazole (GYNE-LOTRIMIN 3) 2 % vaginal cream; Place 1 Applicatorful vaginally at bedtime.   F/u in 3 months sooner if needed for neck pain, left hip pain and rash   Dr. Armen PickupFunches

## 2016-12-02 NOTE — Assessment & Plan Note (Addendum)
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

## 2016-12-03 ENCOUNTER — Ambulatory Visit: Payer: Medicaid Other | Attending: Family Medicine

## 2016-12-15 ENCOUNTER — Ambulatory Visit: Payer: Self-pay

## 2016-12-20 ENCOUNTER — Other Ambulatory Visit: Payer: Self-pay

## 2016-12-20 DIAGNOSIS — J4520 Mild intermittent asthma, uncomplicated: Secondary | ICD-10-CM

## 2016-12-20 MED ORDER — ALBUTEROL SULFATE HFA 108 (90 BASE) MCG/ACT IN AERS: 2.0000 | INHALATION_SPRAY | Freq: Four times a day (QID) | RESPIRATORY_TRACT | 3 refills | Status: DC | PRN

## 2016-12-29 ENCOUNTER — Ambulatory Visit (INDEPENDENT_AMBULATORY_CARE_PROVIDER_SITE_OTHER): Payer: Medicaid Other | Admitting: Family Medicine

## 2016-12-29 ENCOUNTER — Encounter: Payer: Self-pay | Admitting: Family Medicine

## 2016-12-29 VITALS — BP 110/64 | HR 84 | Temp 98.4°F | Ht 62.0 in | Wt 249.0 lb

## 2016-12-29 DIAGNOSIS — L729 Follicular cyst of the skin and subcutaneous tissue, unspecified: Secondary | ICD-10-CM

## 2016-12-29 DIAGNOSIS — L739 Follicular disorder, unspecified: Secondary | ICD-10-CM | POA: Diagnosis not present

## 2016-12-29 MED ORDER — CLINDAMYCIN HCL 300 MG PO CAPS: 300.0000 mg | ORAL_CAPSULE | Freq: Four times a day (QID) | ORAL | 0 refills | Status: DC

## 2016-12-29 MED ORDER — CLINDAMYCIN PHOS-BENZOYL PEROX 1-5 % EX GEL: Freq: Two times a day (BID) | CUTANEOUS | 1 refills | Status: DC

## 2016-12-29 NOTE — Progress Notes (Deleted)
[facility] Progress Note  Subjective:  Laurie Reed is a 31 y.o.  Chief Complaint  Patient presents with  . scalp lesion   Social: Never smoker  Allergies  Allergen Reactions  . Other Anaphylaxis    "20 different types of trees"  . Latex Hives and Itching  . Zofran Itching    Objective: Blood pressure 110/64, pulse 84, temperature 98.4 F (36.9 C), temperature source Oral, height 5\' 2"  (1.575 m), weight 249 lb (112.9 kg), last menstrual period 11/30/2016, SpO2 98 %.  Constitutional:  HENT:  Cardiovascular: RRR, S1, S2, no m/r/g.  Pulmonary/Chest: Effort normal and breath sounds normal. No respiratory distress.  Abdominal: Soft. +BS, soft, NT, ND, no rebound or guarding.  Musculoskeletal: ** Neurological: AOx3, no focal deficits. Skin: Skin is warm and dry. No rash noted. No erythema.  Psychiatric: Normal mood and affect.  Vitals reviewed  Assessment/Plan: No problem-specific Assessment & Plan notes found for this encounter.   Follow-up **.  Laurie GobbleHillary Fitzgerald, MD [facility], PGY-3

## 2016-12-29 NOTE — Progress Notes (Signed)
Subjective:     Patient ID: Laurie Reed, female   DOB: May 20, 1985, 31 y.o.   MRN: [id number]  HPI Scalp lesion:Patient presents with hx of small bumps on the back of her neck and scalp on going on and off for more than one year. Lesion is sometimes itchy and irritating. Rash is aggravated by the use of hair relaxer, however the lesion persists even when she stops the use of hair relaxer. Few days ago she developed a large bump at the center of her head which is painful. She denies any discharge currently from the large bump on her head.  Current Outpatient Prescriptions on File Prior to Visit  Medication Sig Dispense Refill  . albuterol (ACCUNEB) 0.63 MG/3ML nebulizer solution Inhale 1 ampule by nebulization every six (6) hours as needed for wheezing. 75 mL 5  . albuterol (PROVENTIL HFA;VENTOLIN HFA) 108 (90 Base) MCG/ACT inhaler Inhale 2 puffs into the lungs every 6 (six) hours as needed for wheezing or shortness of breath. 54 g 3  . buPROPion (WELLBUTRIN SR) 150 MG 12 hr tablet Take 1 tablet (150 mg total) by mouth 2 (two) times daily. 60 tablet 5  . clotrimazole (GYNE-LOTRIMIN 3) 2 % vaginal cream Place 1 Applicatorful vaginally at bedtime. 21 g 0  . cyclobenzaprine (FLEXERIL) 10 MG tablet Take 1 tablet (10 mg total) by mouth 3 (three) times daily as needed for muscle spasms. 30 tablet 0  . fluticasone (FLONASE) 50 MCG/ACT nasal spray Place 1 spray into both nostrils daily. 16 g 5  . gabapentin (NEURONTIN) 300 MG capsule Take 1 capsule (300 mg total) by mouth 2 (two) times daily. 60 capsule 5  . metroNIDAZOLE (METROCREAM) 0.75 % cream Apply topically 2 (two) times daily. 45 g 1  . Misc. Devices (CANE) MISC 1 each by Does not apply route daily as needed. (Patient not taking: Reported on 09/06/2016) 1 each 0  . naproxen (NAPROSYN) 500 MG tablet Take 1 tablet (500 mg total) by mouth 2 (two) times daily with a meal. 60 tablet 0  . topiramate (TOPAMAX) 25 MG tablet Take 1 tablet (25 mg total) by  mouth daily. 30 tablet 11  . traMADol (ULTRAM) 50 MG tablet Take 1-2 tablets (50-100 mg total) by mouth 3 (three) times daily as needed. 60 tablet 2  . [DISCONTINUED] omeprazole (PRILOSEC) 40 MG capsule Take 1 capsule (40 mg total) by mouth daily. (Patient not taking: Reported on 10/08/2014) 30 capsule 0  . [DISCONTINUED] propranolol (INDERAL) 20 MG tablet Take 2 tablets (40 mg total) by mouth 2 (two) times daily. (Patient not taking: Reported on 09/09/2014) 120 tablet 1   No current facility-administered medications on file prior to visit.    Past Medical History:  Diagnosis Date  . Acid reflux 2004  . Anxiety 2008  . Asthma 1993   no previous intubations or hospitalizations   . Chlamydia   . Depression 2008   no meds, currenly ok  . Environmental allergies   . Migraine   . Migraines   . Miscarriage   . Ovarian cyst   . Reflux   . Ulcer of the stomach and intestine 2004  . Urinary tract infection    Vitals:   12/29/16 1355  BP: 110/64  Pulse: 84  Temp: 98.4 F (36.9 C)  TempSrc: Oral  SpO2: 98%  Weight: 249 lb (112.9 kg)  Height: 5\' 2"  (1.575 m)     Review of Systems  Constitutional: Negative for fever.  Skin:  Scalp lesion  All other systems reviewed and are negative.      Objective:   Physical Exam  Constitutional: She appears well-developed. No distress.  Skin:  Few scattered papulopustular lesion on the back of her head, just at the nape of the neck. There is a large solitary soft cystic mass about 2 cm by 3 cm at the center of her head which is tender to touch without erythema.   Nursing note and vitals reviewed.          Assessment:     Scalp lesions: Likely folliculitis at the nape of her neck triggered by hair relaxer. 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 We recommended avoiding triggers. Avoid use of hair relaxer for now and use milder hair products for now.    Benza-cline prescribed for her  folliculitis.  2. Scalp cyst likely Pilar cyst: Currently tender and might be infected.      Oral clindamycin prescribed.     Might need surgical excision in the future if no resolution.   F/U in 4 weeks with [place] or PCP for reassessment. More than 50% of this 30 min face to face encounter was used on counseling and coordination of care as well as medication prescription.

## 2016-12-29 NOTE — Patient Instructions (Addendum)
Ms. Laurie Reed,  Your scalp skin issues may be due to a process called chemical dermatitis--that a chemical irritates the skin and causes inflammation and swelling. This can also happen with ingrown hairs. We recommend not using relaxant in your hair for the time being.  If the very tender area on your head does not improve with by mouth antibiotics, this may be a cyst. This would require removal by general surgery if it does not improve. Your regular doctor would need to place a referral.  The bumps on your face and neck look more like acne.  Please treat all bumps with benzaclin cream. This contains an antibiotic and a cleanser. Also take clindamycin 300 mg four times daily for 1 week.   Please follow-up in 4 weeks to see if there's been improvement. Or you can give us a call for appointment.   Thank you for letting us take part in your care.

## 2016-12-30 ENCOUNTER — Encounter (HOSPITAL_COMMUNITY): Payer: Self-pay | Admitting: Family Medicine

## 2016-12-30 ENCOUNTER — Ambulatory Visit (HOSPITAL_COMMUNITY)
Admission: EM | Admit: 2016-12-30 | Discharge: 2016-12-30 | Disposition: A | Discharge status: Home / Self Care | Payer: Medicaid Other | Attending: Family Medicine | Admitting: Family Medicine

## 2016-12-30 DIAGNOSIS — G43009 Migraine without aura, not intractable, without status migrainosus: Secondary | ICD-10-CM | POA: Diagnosis not present

## 2016-12-30 MED ORDER — DEXAMETHASONE SODIUM PHOSPHATE 10 MG/ML IJ SOLN
10.0000 mg | Freq: Once | INTRAMUSCULAR | Status: AC
  Administered 2016-12-30: 10 mg via INTRAMUSCULAR

## 2016-12-30 MED ORDER — METOCLOPRAMIDE HCL 5 MG/ML IJ SOLN
5.0000 mg | Freq: Once | INTRAMUSCULAR | Status: AC
  Administered 2016-12-30: 5 mg via INTRAMUSCULAR

## 2016-12-30 MED ORDER — KETOROLAC TROMETHAMINE 60 MG/2ML IM SOLN
45.0000 mg | Freq: Once | INTRAMUSCULAR | Status: AC
  Administered 2016-12-30: 45 mg via INTRAMUSCULAR

## 2016-12-30 MED ORDER — DEXAMETHASONE SODIUM PHOSPHATE 10 MG/ML IJ SOLN
INTRAMUSCULAR | Status: AC
  Filled 2016-12-30: qty 1

## 2016-12-30 MED ORDER — METOCLOPRAMIDE HCL 5 MG/ML IJ SOLN
INTRAMUSCULAR | Status: AC
  Filled 2016-12-30: qty 2

## 2016-12-30 MED ORDER — KETOROLAC TROMETHAMINE 60 MG/2ML IM SOLN
INTRAMUSCULAR | Status: AC
  Filled 2016-12-30: qty 2

## 2016-12-30 MED ORDER — PROMETHAZINE HCL 25 MG PO TABS: 25.0000 mg | ORAL_TABLET | Freq: Four times a day (QID) | ORAL | 0 refills | Status: DC | PRN

## 2016-12-30 NOTE — ED Provider Notes (Signed)
[facility]    CSN: [id number] Arrival date & time: 12/30/16  1022     History   Chief Complaint Chief Complaint  Patient presents with  . Headache  . Nausea    HPI Laurie Reed is a 31 y.o. female.   31 year old female with a history of headaches including migraine is complaining of a headache this started 4-5 days ago. About 3 days ago she developed nausea but no vomiting. She describes it as a burning feeling in his located in the frontal and vertex of the head. Sometimes she has photophobia. Denies focal paresthesias or weakness. Denies problems with balance. No problems with vision, speech, hearing, swallowing. She is ambulatory without assistance. Moves all extremities. She states this headache is similar to past headaches but just did not get better with her medications although she is out of her medications. She states she has an upcoming appointment with her PCP.      Past Medical History:  Diagnosis Date  . Acid reflux 2004  . Anxiety 2008  . Asthma 1993   no previous intubations or hospitalizations   . Chlamydia   . Depression 2008   no meds, currenly ok  . Environmental allergies   . Migraine   . Migraines   . Miscarriage   . Ovarian cyst   . Reflux   . Ulcer of the stomach and intestine 2004  . Urinary tract infection     Patient Active Problem List   Diagnosis Date Noted  . Skin pustule 12/02/2016  . Vagina itching 12/02/2016  . Cervical myelopathy (HCC) 07/01/2016  . Foot swelling 02/18/2016  . Bilateral hand pain 10/30/2015  . Right carpal tunnel syndrome 10/30/2015  . Polymyalgia (HCC) 10/30/2015  . Polyarthralgia 10/30/2015  . Astigmatism 09/18/2015  . Asthma 06/30/2015  . Scalp lesion 06/30/2015  . Vitamin D deficiency 01/22/2015  . Severe obesity (BMI >= 40) (HCC) 01/21/2015  . Foot pain, bilateral 01/21/2015  . Fatigue 01/21/2015  . Sacroiliac joint dysfunction of left side 01/21/2015  . Tinea capitis 01/21/2015  .  Anxiety and depression 01/21/2015  . Patellofemoral arthralgia of right knee 01/08/2015  . Ligamentous laxity of right ankle 01/08/2015  . Right knee pain 01/11/2014  . Headache(784.0) 01/11/2014    Past Surgical History:  Procedure Laterality Date  . COLONOSCOPY  2004   . MULTIPLE TOOTH EXTRACTIONS  2015    3 teeth removed   . WISDOM TOOTH EXTRACTION  2007    OB History    Gravida Para Term Preterm AB Living   5 3 3  0 2 3   SAB TAB Ectopic Multiple Live Births   2 0 0 0 3       Home Medications    Prior to Admission medications   Medication Sig Start Date End Date Taking? Authorizing Provider  albuterol (ACCUNEB) 0.63 MG/3ML nebulizer solution Inhale 1 ampule by nebulization every six (6) hours as needed for wheezing. 12/02/16   Funches, Gerilyn NestleJosalyn, MD  albuterol (PROVENTIL HFA;VENTOLIN HFA) 108 (90 Base) MCG/ACT inhaler Inhale 2 puffs into the lungs every 6 (six) hours as needed for wheezing or shortness of breath. 12/20/16   Funches, Gerilyn NestleJosalyn, MD  buPROPion (WELLBUTRIN SR) 150 MG 12 hr tablet Take 1 tablet (150 mg total) by mouth 2 (two) times daily. 12/02/16   Funches, Gerilyn NestleJosalyn, MD  clindamycin (CLEOCIN) 300 MG capsule Take 1 capsule (300 mg total) by mouth 4 (four) times daily. 12/29/16   Casey BurkittFitzgerald, Hillary Moen, MD  clindamycin-benzoyl  peroxide (BENZACLIN WITH PUMP) gel Apply topically 2 (two) times daily. 12/29/16   Casey Burkitt, MD  clotrimazole (GYNE-LOTRIMIN 3) 2 % vaginal cream Place 1 Applicatorful vaginally at bedtime. 12/02/16   Funches, Gerilyn Nestle, MD  cyclobenzaprine (FLEXERIL) 10 MG tablet Take 1 tablet (10 mg total) by mouth 3 (three) times daily as needed for muscle spasms. 12/02/16   Funches, Gerilyn Nestle, MD  fluticasone (FLONASE) 50 MCG/ACT nasal spray Place 1 spray into both nostrils daily. 12/02/16   Funches, Gerilyn Nestle, MD  gabapentin (NEURONTIN) 300 MG capsule Take 1 capsule (300 mg total) by mouth 2 (two) times daily. 12/02/16   Funches, Gerilyn Nestle, MD  metroNIDAZOLE  (METROCREAM) 0.75 % cream Apply topically 2 (two) times daily. 12/02/16   Dessa Phi, MD  Misc. Devices (CANE) MISC 1 each by Does not apply route daily as needed. Patient not taking: Reported on 09/06/2016 08/02/16   Dessa Phi, MD  naproxen (NAPROSYN) 500 MG tablet Take 1 tablet (500 mg total) by mouth 2 (two) times daily with a meal. 12/02/16   Funches, Gerilyn Nestle, MD  promethazine (PHENERGAN) 25 MG tablet Take 1 tablet (25 mg total) by mouth every 6 (six) hours as needed for nausea. 12/30/16   Hayden Rasmussen, NP  topiramate (TOPAMAX) 25 MG tablet Take 1 tablet (25 mg total) by mouth daily. 01/07/16   Patel, Roxana Hires K, DO  traMADol (ULTRAM) 50 MG tablet Take 1-2 tablets (50-100 mg total) by mouth 3 (three) times daily as needed. 12/02/16   Dessa Phi, MD    Family History Family History  Problem Relation Age of Onset  . Asthma Mother   . Heart disease Mother   . Diabetes Mother   . Hypertension Mother   . Hypertension Father   . Asthma Maternal Aunt   . Hypertension Maternal Aunt   . Diabetes Maternal Aunt   . Asthma Maternal Uncle   . Hypertension Maternal Uncle   . Diabetes Maternal Uncle   . Heart disease Maternal Grandmother   . Diabetes Maternal Grandmother   . Hypertension Maternal Grandmother   . Heart disease Maternal Grandfather   . Diabetes Maternal Grandfather   . Hypertension Maternal Grandfather   . Hypotension Neg Hx   . Anesthesia problems Neg Hx   . Malignant hyperthermia Neg Hx   . Pseudochol deficiency Neg Hx   . Cancer Neg Hx     Social History Social History  Substance Use Topics  . Smoking status: Never Smoker  . Smokeless tobacco: Never Used  . Alcohol use No     Allergies   Other; Latex; and Zofran   Review of Systems Review of Systems  Constitutional: Positive for activity change. Negative for chills, diaphoresis and fever.  HENT: Negative for ear pain, hearing loss, nosebleeds, sore throat and tinnitus.   Eyes:       See also HPI    Respiratory: Negative for cough and shortness of breath.   Cardiovascular: Negative for chest pain, palpitations and leg swelling.  Gastrointestinal: Positive for nausea. Negative for abdominal pain and vomiting.  Genitourinary: Negative.   Skin: Negative for rash.  Neurological: Positive for headaches. Negative for dizziness, tremors, seizures, syncope, facial asymmetry, speech difficulty, weakness, light-headedness and numbness.       See also HPI  All other systems reviewed and are negative.    Physical Exam Triage Vital Signs ED Triage Vitals  Enc Vitals Group     BP 12/30/16 1049 124/77     Pulse Rate 12/30/16 1049 80  Resp 12/30/16 1049 16     Temp 12/30/16 1049 98.2 F (36.8 C)     Temp Source 12/30/16 1049 Oral     SpO2 12/30/16 1049 97 %     Weight --      Height --      Head Circumference --      Peak Flow --      Pain Score 12/30/16 1056 8     Pain Loc --      Pain Edu? --      Excl. in GC? --    No data found.   Updated Vital Signs BP 124/77 (BP Location: Right Arm)   Pulse 80   Temp 98.2 F (36.8 C) (Oral)   Resp 16   LMP 12/21/2016   SpO2 97%   Visual Acuity Right Eye Distance:   Left Eye Distance:   Bilateral Distance:    Right Eye Near:   Left Eye Near:    Bilateral Near:     Physical Exam  Constitutional: She is oriented to person, place, and time. She appears well-developed and well-nourished. No distress.  HENT:  Head: Normocephalic and atraumatic.  Mouth/Throat: Oropharynx is clear and moist.  Supple rises symmetrically. Tongue is midline. Face is symmetric.  Eyes: Pupils are equal, round, and reactive to light. Conjunctivae and EOM are normal.  Neck: Normal range of motion. Neck supple.  Cardiovascular: Normal rate and regular rhythm.   Pulmonary/Chest: Effort normal and breath sounds normal. She has no wheezes. She has no rales.  Abdominal: There is no tenderness.  Musculoskeletal: She exhibits no edema.  Lymphadenopathy:     She has no cervical adenopathy.  Neurological: She is alert and oriented to person, place, and time. She has normal strength. She displays no tremor. No cranial nerve deficit or sensory deficit. She exhibits normal muscle tone. She displays no seizure activity. Coordination normal. GCS eye subscore is 4. GCS verbal subscore is 5. GCS motor subscore is 6.  Skin: Skin is warm and dry.  Psychiatric: She has a normal mood and affect.  Nursing note and vitals reviewed.    UC Treatments / Results  Labs (all labs ordered are listed, but only abnormal results are displayed) Labs Reviewed - No data to display  EKG  EKG Interpretation None       Radiology No results found.  Procedures Procedures (including critical care time)  Medications Ordered in UC Medications  ketorolac (TORADOL) injection 45 mg (not administered)  metoCLOPramide (REGLAN) injection 5 mg (not administered)  dexamethasone (DECADRON) injection 10 mg (not administered)     Initial Impression / Assessment and Plan / UC Course  I have reviewed the triage vital signs and the nursing notes.  Pertinent labs & imaging results that were available during my care of the patient were reviewed by me and considered in my medical decision making (see chart for details).     Your given medications here to help with your headache. You are also given a prescription for just a few Phenergan tablets to help with nausea. Keep your appointment with Dr. Timoteo Ace. If he get worse or have new symptoms or problems, vomiting and unable to hold down fluids, food or medications you may need to go to the emergency department.   Final Clinical Impressions(s) / UC Diagnoses   Final diagnoses:  Migraine without aura and without status migrainosus, not intractable    New Prescriptions New Prescriptions   PROMETHAZINE (PHENERGAN) 25 MG TABLET  Take 1 tablet (25 mg total) by mouth every 6 (six) hours as needed for nausea.      Controlled Substance Prescriptions [place] Controlled Substance Registry consulted? Not Applicable   Hayden Rasmussen, NP 12/30/16 1206

## 2016-12-30 NOTE — ED Triage Notes (Signed)
Pt here for headache x 4 day and nausea. Denies V,D.

## 2016-12-30 NOTE — Discharge Instructions (Signed)
Your given medications here to help with your headache. You are also given a prescription for just a few Phenergan tablets to help with nausea. Keep your appointment with Dr. Timoteo AceFutch's. If he get worse or have new symptoms or problems, vomiting and unable to hold down fluids, food or medications you may need to go to the emergency department.

## 2017-01-04 ENCOUNTER — Other Ambulatory Visit: Payer: Self-pay

## 2017-01-04 DIAGNOSIS — J4520 Mild intermittent asthma, uncomplicated: Secondary | ICD-10-CM

## 2017-01-04 MED ORDER — ALBUTEROL SULFATE HFA 108 (90 BASE) MCG/ACT IN AERS: 2.0000 | INHALATION_SPRAY | Freq: Four times a day (QID) | RESPIRATORY_TRACT | 3 refills | Status: DC | PRN

## 2017-02-03 ENCOUNTER — Ambulatory Visit: Payer: Self-pay

## 2017-02-24 ENCOUNTER — Other Ambulatory Visit: Payer: Self-pay | Admitting: Occupational Medicine

## 2017-02-24 ENCOUNTER — Ambulatory Visit: Payer: Self-pay

## 2017-02-24 DIAGNOSIS — M25572 Pain in left ankle and joints of left foot: Secondary | ICD-10-CM

## 2017-02-24 DIAGNOSIS — M79672 Pain in left foot: Secondary | ICD-10-CM

## 2017-03-02 DIAGNOSIS — Z131 Encounter for screening for diabetes mellitus: Secondary | ICD-10-CM | POA: Insufficient documentation

## 2017-03-07 ENCOUNTER — Ambulatory Visit: Payer: Medicaid Other | Attending: Internal Medicine | Admitting: Internal Medicine

## 2017-03-07 ENCOUNTER — Encounter: Payer: Self-pay | Admitting: Internal Medicine

## 2017-03-07 VITALS — BP 138/90 | HR 83 | Temp 98.4°F | Resp 16 | Wt 253.6 lb

## 2017-03-07 DIAGNOSIS — R21 Rash and other nonspecific skin eruption: Secondary | ICD-10-CM | POA: Insufficient documentation

## 2017-03-07 DIAGNOSIS — E669 Obesity, unspecified: Secondary | ICD-10-CM | POA: Insufficient documentation

## 2017-03-07 DIAGNOSIS — G5601 Carpal tunnel syndrome, right upper limb: Secondary | ICD-10-CM | POA: Insufficient documentation

## 2017-03-07 DIAGNOSIS — Z131 Encounter for screening for diabetes mellitus: Secondary | ICD-10-CM | POA: Insufficient documentation

## 2017-03-07 DIAGNOSIS — J4520 Mild intermittent asthma, uncomplicated: Secondary | ICD-10-CM | POA: Insufficient documentation

## 2017-03-07 DIAGNOSIS — M7989 Other specified soft tissue disorders: Secondary | ICD-10-CM | POA: Insufficient documentation

## 2017-03-07 DIAGNOSIS — M542 Cervicalgia: Secondary | ICD-10-CM | POA: Diagnosis not present

## 2017-03-07 DIAGNOSIS — L089 Local infection of the skin and subcutaneous tissue, unspecified: Secondary | ICD-10-CM

## 2017-03-07 DIAGNOSIS — M25572 Pain in left ankle and joints of left foot: Secondary | ICD-10-CM

## 2017-03-07 DIAGNOSIS — M533 Sacrococcygeal disorders, not elsewhere classified: Secondary | ICD-10-CM | POA: Insufficient documentation

## 2017-03-07 DIAGNOSIS — F329 Major depressive disorder, single episode, unspecified: Secondary | ICD-10-CM | POA: Diagnosis not present

## 2017-03-07 DIAGNOSIS — M353 Polymyalgia rheumatica: Secondary | ICD-10-CM | POA: Insufficient documentation

## 2017-03-07 DIAGNOSIS — Z79899 Other long term (current) drug therapy: Secondary | ICD-10-CM | POA: Insufficient documentation

## 2017-03-07 DIAGNOSIS — F419 Anxiety disorder, unspecified: Secondary | ICD-10-CM | POA: Diagnosis not present

## 2017-03-07 LAB — POCT GLYCOSYLATED HEMOGLOBIN (HGB A1C): Hemoglobin A1C: 5.8

## 2017-03-07 MED ORDER — CLINDAMYCIN HCL 300 MG PO CAPS: 300.0000 mg | ORAL_CAPSULE | Freq: Four times a day (QID) | ORAL | 0 refills | Status: DC

## 2017-03-07 NOTE — Patient Instructions (Signed)
Keep appointment with orthopedics later this week.

## 2017-03-07 NOTE — Progress Notes (Signed)
Patient ID: Laurie Reed, female    DOB: [birth year]  MRN: [id number]  CC: re-establish; Neck Pain; and Rash   Subjective: Laurie Reed is a 31 y.o. female who presents for chronic ds management. Last saw Dr. Armen PickupFunches 11/2016. Her concerns today include:  Hx of mild intermittent asthma, obesity, SI jt dysfunction  1. C/o pain LT ankle x 2 wks. Started after she slipped and twisted ankle while coming off company van.  -had immediate swelling and pain and problems bearing wgh -saw company doctor and x-ray neg. Told she has a sprained ankle. Given velcrow immobilizer and crutches. Naprosyn PRN.Placed on light duty, basically desk work. -ankle still painful and hurts to put wgh on ankle. Has appt with ortho this wk  2. Saw derm in August for recurrent pustules on scalp. Given rxn for Clindamycin and an ab/benzoyl gel. Misplaced rxn for oral abx. Edyth GunnelsWold like for me to prescribe Patient Active Problem List   Diagnosis Date Noted  . Diabetes mellitus screening 03/02/2017  . Skin pustule 12/02/2016  . Vagina itching 12/02/2016  . Cervical myelopathy (HCC) 07/01/2016  . Foot swelling 02/18/2016  . Bilateral hand pain 10/30/2015  . Right carpal tunnel syndrome 10/30/2015  . Polymyalgia (HCC) 10/30/2015  . Polyarthralgia 10/30/2015  . Astigmatism 09/18/2015  . Asthma 06/30/2015  . Scalp lesion 06/30/2015  . Vitamin D deficiency 01/22/2015  . Severe obesity (BMI >= 40) (HCC) 01/21/2015  . Foot pain, bilateral 01/21/2015  . Fatigue 01/21/2015  . Sacroiliac joint dysfunction of left side 01/21/2015  . Tinea capitis 01/21/2015  . Anxiety and depression 01/21/2015  . Patellofemoral arthralgia of right knee 01/08/2015  . Ligamentous laxity of right ankle 01/08/2015  . Right knee pain 01/11/2014  . Headache(784.0) 01/11/2014     Current Outpatient Prescriptions on File Prior to Visit  Medication Sig Dispense Refill  . albuterol (ACCUNEB) 0.63 MG/3ML nebulizer solution Inhale 1  ampule by nebulization every six (6) hours as needed for wheezing. 75 mL 5  . albuterol (PROVENTIL HFA;VENTOLIN HFA) 108 (90 Base) MCG/ACT inhaler Inhale 2 puffs into the lungs every 6 (six) hours as needed for wheezing or shortness of breath. 54 g 3  . buPROPion (WELLBUTRIN SR) 150 MG 12 hr tablet Take 1 tablet (150 mg total) by mouth 2 (two) times daily. 60 tablet 5  . clindamycin-benzoyl peroxide (BENZACLIN WITH PUMP) gel Apply topically 2 (two) times daily. (Patient not taking: Reported on 03/07/2017) 50 g 1  . clotrimazole (GYNE-LOTRIMIN 3) 2 % vaginal cream Place 1 Applicatorful vaginally at bedtime. (Patient not taking: Reported on 03/07/2017) 21 g 0  . cyclobenzaprine (FLEXERIL) 10 MG tablet Take 1 tablet (10 mg total) by mouth 3 (three) times daily as needed for muscle spasms. (Patient not taking: Reported on 03/07/2017) 30 tablet 0  . fluticasone (FLONASE) 50 MCG/ACT nasal spray Place 1 spray into both nostrils daily. 16 g 5  . gabapentin (NEURONTIN) 300 MG capsule Take 1 capsule (300 mg total) by mouth 2 (two) times daily. 60 capsule 5  . metroNIDAZOLE (METROCREAM) 0.75 % cream Apply topically 2 (two) times daily. (Patient not taking: Reported on 03/07/2017) 45 g 1  . Misc. Devices (CANE) MISC 1 each by Does not apply route daily as needed. (Patient not taking: Reported on 09/06/2016) 1 each 0  . naproxen (NAPROSYN) 500 MG tablet Take 1 tablet (500 mg total) by mouth 2 (two) times daily with a meal. (Patient not taking: Reported on 03/07/2017) 60 tablet 0  .  promethazine (PHENERGAN) 25 MG tablet Take 1 tablet (25 mg total) by mouth every 6 (six) hours as needed for nausea. (Patient not taking: Reported on 03/07/2017) 6 tablet 0  . topiramate (TOPAMAX) 25 MG tablet Take 1 tablet (25 mg total) by mouth daily. 30 tablet 11  . traMADol (ULTRAM) 50 MG tablet Take 1-2 tablets (50-100 mg total) by mouth 3 (three) times daily as needed. 60 tablet 2  . [DISCONTINUED] omeprazole (PRILOSEC) 40 MG  capsule Take 1 capsule (40 mg total) by mouth daily. (Patient not taking: Reported on 10/08/2014) 30 capsule 0  . [DISCONTINUED] propranolol (INDERAL) 20 MG tablet Take 2 tablets (40 mg total) by mouth 2 (two) times daily. (Patient not taking: Reported on 09/09/2014) 120 tablet 1   No current facility-administered medications on file prior to visit.     Allergies  Allergen Reactions  . Other Anaphylaxis    "20 different types of trees"  . Latex Hives and Itching  . Zofran Itching    Social History   Social History  . Marital status: Divorced    Spouse name: N/A  . Number of children: 3  . Years of education: 12    Occupational History  .  [employer]   Social History Main Topics  . Smoking status: Never Smoker  . Smokeless tobacco: Never Used  . Alcohol use No  . Drug use: No  . Sexual activity: Not Currently    Birth control/ protection: None     Comment: not since last MAU visit   Other Topics Concern  . Not on file   Social History Narrative   Lives with 3 children.   Immediate family in [place].    Born in [place].   Raised in [place], [place].    Lives in a 2 story home.   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 Asthma Mother   . Heart disease Mother   . Diabetes Mother   . Hypertension Mother   . Hypertension Father   . Asthma Maternal Aunt   . Hypertension Maternal Aunt   . Diabetes Maternal Aunt   . Asthma Maternal Uncle   . Hypertension Maternal Uncle   . Diabetes Maternal Uncle   . Heart disease Maternal Grandmother   . Diabetes Maternal Grandmother   . Hypertension Maternal Grandmother   . Heart disease Maternal Grandfather   . Diabetes Maternal Grandfather   . Hypertension Maternal Grandfather   . Hypotension Neg Hx   . Anesthesia problems Neg Hx   . Malignant hyperthermia Neg Hx   . Pseudochol deficiency Neg Hx   . Cancer Neg Hx     Past Surgical History:    Procedure Laterality Date  . COLONOSCOPY  2004   . MULTIPLE TOOTH EXTRACTIONS  2015    3 teeth removed   . WISDOM TOOTH EXTRACTION  2007    ROS: Review of Systems Neg except as apbove PHYSICAL EXAM: BP 138/90   Pulse 83   Temp 98.4 F (36.9 C) (Oral)   Resp 16   Wt 253 lb 9.6 oz (115 kg)   LMP 02/13/2017   SpO2 99%   BMI 46.38 kg/m   Physical Exam  General appearance - alert, well appearing,obes AAF and in no distress Mental status - alert, oriented to person, place, and time, normal mood, behavior, speech, dress, motor activity, and thought  processes Musculoskeletal - LT leg/ankle - velcrow boot removed. Mild edema of lower 1/3 leg. Mild tenderness on palpation of dorsal mid foot and lateral malleolus, moderate discomfort with passive dorsi and plantar flexion and inversion and eversion Skin - 3 ball spots with raised fluctuance  noted on scalp  Results for orders placed or performed in visit on 03/07/17  POCT glycosylated hemoglobin (Hb A1C)  Result Value Ref Range   Hemoglobin A1C 5.8     ASSESSMENT AND PLAN: 1. Acute left ankle pain Severe sprain vs tendon injury Keep ortho appt Decline note keeping her off work for 1 wk 2. Skin pustule -rxn for Clinda sent to pharmacy  3. Diabetes mellitus screening - POCT glycosylated hemoglobin (Hb A1C)   Patient was given the opportunity to ask questions.  Patient verbalized understanding of the plan and was able to repeat key elements of the plan.   Orders Placed This Encounter  Procedures  . POCT glycosylated hemoglobin (Hb A1C)     Requested Prescriptions   Signed Prescriptions Disp Refills  . clindamycin (CLEOCIN) 300 MG capsule 28 capsule 0    Sig: Take 1 capsule (300 mg total) by mouth 4 (four) times daily.    Return in about 3 months (around 06/07/2017).  Jonah Blue, MD, FACP

## 2017-03-18 ENCOUNTER — Other Ambulatory Visit: Payer: Self-pay | Admitting: Pharmacist

## 2017-03-18 DIAGNOSIS — J4520 Mild intermittent asthma, uncomplicated: Secondary | ICD-10-CM

## 2017-03-18 MED ORDER — ALBUTEROL SULFATE HFA 108 (90 BASE) MCG/ACT IN AERS: 2.0000 | INHALATION_SPRAY | Freq: Four times a day (QID) | RESPIRATORY_TRACT | 1 refills | Status: DC | PRN

## 2017-03-26 ENCOUNTER — Ambulatory Visit (HOSPITAL_COMMUNITY)
Admission: EM | Admit: 2017-03-26 | Discharge: 2017-03-26 | Disposition: A | Discharge status: Home / Self Care | Payer: Medicaid Other | Attending: Student | Admitting: Student

## 2017-03-26 ENCOUNTER — Other Ambulatory Visit: Payer: Self-pay

## 2017-03-26 ENCOUNTER — Encounter (HOSPITAL_COMMUNITY): Payer: Self-pay | Admitting: Emergency Medicine

## 2017-03-26 DIAGNOSIS — M545 Low back pain: Secondary | ICD-10-CM

## 2017-03-26 DIAGNOSIS — L729 Follicular cyst of the skin and subcutaneous tissue, unspecified: Secondary | ICD-10-CM

## 2017-03-26 DIAGNOSIS — S46911A Strain of unspecified muscle, fascia and tendon at shoulder and upper arm level, right arm, initial encounter: Secondary | ICD-10-CM

## 2017-03-26 DIAGNOSIS — G8929 Other chronic pain: Secondary | ICD-10-CM

## 2017-03-26 MED ORDER — KETOROLAC TROMETHAMINE 60 MG/2ML IM SOLN
60.0000 mg | Freq: Once | INTRAMUSCULAR | Status: AC
  Administered 2017-03-26: 60 mg via INTRAMUSCULAR

## 2017-03-26 MED ORDER — KETOROLAC TROMETHAMINE 60 MG/2ML IM SOLN
INTRAMUSCULAR | Status: AC
  Filled 2017-03-26: qty 2

## 2017-03-26 NOTE — Discharge Instructions (Addendum)
-  Take it easy over next 48 hours. -Follow-up with dermatologist for cyst present to the scalp.

## 2017-03-26 NOTE — ED Triage Notes (Addendum)
[facility] a [facility] of a rash or abscess on her scalp that is chronic. The patient stated that she is under the care of a dermatologist. The patient also complained of right side back pain x 1 week.

## 2017-03-30 ENCOUNTER — Telehealth: Payer: Self-pay | Admitting: Internal Medicine

## 2017-03-30 DIAGNOSIS — L089 Local infection of the skin and subcutaneous tissue, unspecified: Secondary | ICD-10-CM

## 2017-03-30 NOTE — Telephone Encounter (Signed)
Pt called to request a referral surgeon for her scalp, that was recommend by the dermatology, please follow up

## 2017-05-02 ENCOUNTER — Telehealth: Payer: Self-pay | Admitting: Internal Medicine

## 2017-05-02 NOTE — Telephone Encounter (Signed)
Pt. Called to check the status of her general surgery referral. Please f/u

## 2017-05-04 NOTE — Telephone Encounter (Signed)
Sent Referral to [facility] . They will review the notes and contact patient to schedule an appointment

## 2017-05-17 NOTE — L&D Delivery Note (Signed)
Delivery Note Pt became complete at 0537 and started pushing shortly afterwards x 45 mins without successful fetal descent. It was determined that baby was most likely in an OP position and so position changes/peanut ball were employed. In the meantime she became febrile with a T of 100.5 and so Tylenol/Amp/Gent were given. FHR began having more variable decels with a decrease in variability as well as tachycardia, so she began pushing a couple minutes before 1100, and at 11:05 AM a viable female was delivered via Vaginal, Spontaneous (Presentation: direct OP).  APGAR: 8,9 ; weight: pending. Infant dried and placed on pt's abd; cord clamped and cut by FOB and pt's mother after 1min delay; infant handed to awaiting peds team for eval due to FHR tracing although infant was stable; hospital cord blood sample collected. Placenta status: spont , intact .  Cord: 3 vessels  Anesthesia:  Epidural Episiotomy:  None Lacerations:  None Est. Blood Loss (mL):  406cc  Mom to postpartum after her ppBTL planned for 1300  Baby to Couplet care / Skin to Skin.  Laurie Reed CNM 04/04/2018, 11:28 AM  Please schedule this patient for Postpartum visit in: 4 weeks with the following provider: Any provider For C/S patients schedule nurse incision check in weeks 2 weeks: no Low risk pregnancy complicated by: unknown GDM status Delivery mode:  SVD Anticipated Birth Control:  BTL done PP PP Procedures needed: none  Schedule Integrated BH visit: no

## 2017-05-23 ENCOUNTER — Other Ambulatory Visit: Payer: Self-pay

## 2017-05-25 ENCOUNTER — Encounter: Payer: Self-pay | Admitting: Surgery

## 2017-05-25 ENCOUNTER — Ambulatory Visit (INDEPENDENT_AMBULATORY_CARE_PROVIDER_SITE_OTHER): Payer: Medicaid Other | Admitting: Surgery

## 2017-05-25 VITALS — BP 131/86 | HR 88 | Temp 98.2°F | Ht 62.0 in | Wt 254.0 lb

## 2017-05-25 DIAGNOSIS — L723 Sebaceous cyst: Secondary | ICD-10-CM

## 2017-05-25 NOTE — H&P (View-Only) (Signed)
Surgical Clinic History and Physical  Referring provider:  Marcine MatarJohnson, Deborah B, MD [address] [place], [place] [address]  HISTORY OF PRESENT ILLNESS (HPI):  32 y.o. female presents for evaluation of multiple painful scalp masses. Patient reports all 4 have been present for "many years", though the 2 towards the front of her scalp have become particularly painful, while 2 others along the back (occiput) of her head do not bother her nearly as much. She also further describes that the two in the front of her head intermittently become larger and more painful before spontaneously draining white fluid and then becoming smaller and less painful. She adds that she was once prescribed antibiotics, which did not provide relief, and seen by dermatologist with cream provided, which likewise did not provide any relief. She otherwise denies any fever/chills or any recent drainage.  PAST MEDICAL HISTORY (PMH):  Past Medical History:  Diagnosis Date  . Acid reflux 2004  . Anxiety 2008  . Asthma 1993   no previous intubations or hospitalizations   . Chlamydia   . Depression 2008   no meds, currenly ok  . Environmental allergies   . Migraine   . Migraines   . Miscarriage   . Ovarian cyst   . Reflux   . Ulcer of the stomach and intestine 2004  . Urinary tract infection      PAST SURGICAL HISTORY (PSH):  Past Surgical History:  Procedure Laterality Date  . COLONOSCOPY  2004   . MULTIPLE TOOTH EXTRACTIONS  2015    3 teeth removed   . WISDOM TOOTH EXTRACTION  2007     MEDICATIONS:  Prior to Admission medications   Medication Sig Start Date End Date Taking? Authorizing Provider  albuterol (ACCUNEB) 0.63 MG/3ML nebulizer solution Inhale 1 ampule by nebulization every six (6) hours as needed for wheezing. 12/02/16  Yes Funches, Josalyn, MD  albuterol (PROVENTIL HFA;VENTOLIN HFA) 108 (90 Base) MCG/ACT inhaler Inhale 2 puffs into the lungs every 6 (six) hours as needed for wheezing or shortness  of breath. 03/18/17  Yes Marcine MatarJohnson, Deborah B, MD  buPROPion [facility](WELLBUTRIN SR) 150 MG 12 hr tablet Take 1 tablet (150 mg total) by mouth 2 (two) times daily. 12/02/16  Yes Funches, Josalyn, MD  clindamycin-benzoyl peroxide (BENZACLIN WITH PUMP) gel Apply topically 2 (two) times daily. 12/29/16  Yes Casey BurkittFitzgerald, Hillary Moen, MD  clotrimazole (GYNE-LOTRIMIN 3) 2 % vaginal cream Place 1 Applicatorful vaginally at bedtime. 12/02/16  Yes Funches, Josalyn, MD  cyclobenzaprine (FLEXERIL) 10 MG tablet Take 1 tablet (10 mg total) by mouth 3 (three) times daily as needed for muscle spasms. 12/02/16  Yes Funches, Josalyn, MD  fexofenadine (ALLEGRA) 180 MG tablet Take 1 tablet by mouth 1 day or 1 dose. 03/18/17  Yes [provider]  fluticasone (FLONASE) 50 MCG/ACT nasal spray Place 1 spray into both nostrils daily. 12/02/16  Yes Funches, Josalyn, MD  gabapentin (NEURONTIN) 300 MG capsule Take 1 capsule (300 mg total) by mouth 2 (two) times daily. 12/02/16  Yes Funches, Gerilyn NestleJosalyn, MD  naproxen (NAPROSYN) 500 MG tablet Take 1 tablet (500 mg total) by mouth 2 (two) times daily with a meal. 12/02/16  Yes Funches, Josalyn, MD  topiramate (TOPAMAX) 25 MG tablet Take 1 tablet (25 mg total) by mouth daily. 01/07/16  Yes Patel, Donika K, DO  traMADol (ULTRAM) 50 MG tablet Take 1-2 tablets (50-100 mg total) by mouth 3 (three) times daily as needed. 12/02/16  Yes Dessa PhiFunches, Josalyn, MD  ALLERGIES:  Allergies  Allergen Reactions  . Other Anaphylaxis    "20 different types of trees"  . Latex Hives and Itching  . Zofran Itching     SOCIAL HISTORY:  Social History   Socioeconomic History  . Marital status: Divorced    Spouse name: Not on file  . Number of children: 3  . Years of education: 52   . Highest education level: Not on file  Social Needs  . Financial resource strain: Not on file  . Food insecurity - worry: Not on file  . Food insecurity - inability: Not on file  . Transportation needs - medical: Not on  file  . Transportation needs - non-medical: Not on file  Occupational History    Employer: [employer]  Tobacco Use  . Smoking status: Never Smoker  . Smokeless tobacco: Never Used  Substance and Sexual Activity  . Alcohol use: No    Alcohol/week: 0.0 oz  . Drug use: No  . Sexual activity: Not Currently    Birth control/protection: None    Comment: not since last MAU visit  Other Topics Concern  . Not on file  Social History Narrative   Lives with 3 children.   Immediate family in [place].    Born in [place].   Raised in [place], [place].    Lives in a 2 story home.   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 Asthma Mother   . Heart disease Mother   . Diabetes Mother   . Hypertension Mother   . Hypertension Father   . Asthma Maternal Aunt   . Hypertension Maternal Aunt   . Diabetes Maternal Aunt   . Asthma Maternal Uncle   . Hypertension Maternal Uncle   . Diabetes Maternal Uncle   . Heart disease Maternal Grandmother   . Diabetes Maternal Grandmother   . Hypertension Maternal Grandmother   . Heart disease Maternal Grandfather   . Diabetes Maternal Grandfather   . Hypertension Maternal Grandfather   . Hypotension Neg Hx   . Anesthesia problems Neg Hx   . Malignant hyperthermia Neg Hx   . Pseudochol deficiency Neg Hx   . Cancer Neg Hx     Otherwise negative/non-contributory.  REVIEW OF SYSTEMS:  Constitutional: denies any other weight loss, fever, chills, or sweats  Eyes: denies any other vision changes, history of eye injury  ENT: denies sore throat, hearing problems  Respiratory: denies shortness of breath, wheezing  Cardiovascular: denies chest pain, palpitations  Gastrointestinal: denies abdominal pain, N/V, or  diarrhea Musculoskeletal: denies any other joint pains or cramps  Skin: Denies any other rashes or skin discolorations except as per HPI Neurological: denies any other headache, dizziness, weakness  Psychiatric: Denies any other depression, anxiety   All other review of systems were otherwise negative   VITAL SIGNS:  BP 131/86   Pulse 88   Temp 98.2 F (36.8 C) (Oral)   Ht 5\' 2"  (1.575 m)   Wt 254 lb (115.2 kg)   BMI 46.46 kg/m   PHYSICAL EXAM:  Constitutional:  -- Obese body habitus  -- Awake, alert, and oriented x3  Eyes:  -- Pupils equally round and reactive to light  -- No scleral icterus  Ear, nose, throat:  --  No jugular venous distension -- No nasal drainage, bleeding Pulmonary:  -- No crackles  -- Equal breath sounds bilaterally -- Breathing non-labored at rest Cardiovascular:  -- S1, S2 present  -- No pericardial rubs  Gastrointestinal:  -- Abdomen soft, nontender, non-distended, no guarding/rebound  -- No abdominal masses appreciated, pulsatile or otherwise  Musculoskeletal and Integumentary:  -- Wounds or skin discoloration: 1 cm x 3 (top and occipital x 2) + 1.5 - 2 cm x 1 top front -- Extremities: B/L UE and LE FROM, hands and feet warm  Neurologic:  -- Motor function: Intact and symmetric -- Sensation: Intact and symmetric  Labs:  CBC:  Lab Results  Component Value Date   WBC 8.6 07/15/2016   RBC 4.17 07/15/2016   BMP:  Lab Results  Component Value Date   GLUCOSE 105 (H) 07/15/2016   CO2 24 07/15/2016   BUN 8 07/15/2016   CREATININE 0.68 07/15/2016   CREATININE 0.65 01/11/2014   CALCIUM 8.6 (L) 07/15/2016     Imaging studies: No new pertinent imaging studies available fo review   Assessment/Plan:  32 y.o. female with multiple symptomatic scalp sebaceous/epidermoid inclusion cysts, complicated by co-morbidities including morbid obesity (BMI: 47), asthma, GERD, chronic migraine headaches, major depression disorder, and generalized anxiety  disorder.   - keep affected sites clean  - all risks, benefits, and alternatives to excision of multiple painful scalp masses were discussed with the patient, all of her questions were answered to her expressed satisfaction, patient expresses she wishes to proceed, and informed consent was obtained.  - will plan for elective ambulatory excision of multiple painful scalp masses/lesions on 1/16   - return to clinic 2 weeks after procedure, instructed to call if any questions or concerns  All of the above recommendations were discussed with the patient, and all of patient's questions were answered to her expressed satisfaction.  Thank you for the opportunity to participate in this patient's care.  -- Scherrie Gerlach Earlene Plater, MD, RPVI [place]: [facility] General [facility] - Partnering for exceptional care. Office: [phone]

## 2017-05-25 NOTE — Patient Instructions (Signed)
We will be able to do [facility] on [facility]/2017 at [facility] with Dr. Satira MccallumJason Davis. Please look at your blue sheet in case you have any questions about your surgery.

## 2017-05-25 NOTE — Progress Notes (Signed)
Surgical Clinic History and Physical  Referring provider:  Marcine MatarJohnson, Deborah B, MD [address] [place], [place] [address]  HISTORY OF PRESENT ILLNESS (HPI):  32 y.o. female presents for evaluation of multiple painful scalp masses. Patient reports all 4 have been present for "many years", though the 2 towards the front of her scalp have become particularly painful, while 2 others along the back (occiput) of her head do not bother her nearly as much. She also further describes that the two in the front of her head intermittently become larger and more painful before spontaneously draining white fluid and then becoming smaller and less painful. She adds that she was once prescribed antibiotics, which did not provide relief, and seen by dermatologist with cream provided, which likewise did not provide any relief. She otherwise denies any fever/chills or any recent drainage.  PAST MEDICAL HISTORY (PMH):  Past Medical History:  Diagnosis Date  . Acid reflux 2004  . Anxiety 2008  . Asthma 1993   no previous intubations or hospitalizations   . Chlamydia   . Depression 2008   no meds, currenly ok  . Environmental allergies   . Migraine   . Migraines   . Miscarriage   . Ovarian cyst   . Reflux   . Ulcer of the stomach and intestine 2004  . Urinary tract infection      PAST SURGICAL HISTORY (PSH):  Past Surgical History:  Procedure Laterality Date  . COLONOSCOPY  2004   . MULTIPLE TOOTH EXTRACTIONS  2015    3 teeth removed   . WISDOM TOOTH EXTRACTION  2007     MEDICATIONS:  Prior to Admission medications   Medication Sig Start Date End Date Taking? Authorizing Provider  albuterol (ACCUNEB) 0.63 MG/3ML nebulizer solution Inhale 1 ampule by nebulization every six (6) hours as needed for wheezing. 12/02/16  Yes Funches, Josalyn, MD  albuterol (PROVENTIL HFA;VENTOLIN HFA) 108 (90 Base) MCG/ACT inhaler Inhale 2 puffs into the lungs every 6 (six) hours as needed for wheezing or shortness  of breath. 03/18/17  Yes Marcine MatarJohnson, Deborah B, MD  buPROPion [facility](WELLBUTRIN SR) 150 MG 12 hr tablet Take 1 tablet (150 mg total) by mouth 2 (two) times daily. 12/02/16  Yes Funches, Josalyn, MD  clindamycin-benzoyl peroxide (BENZACLIN WITH PUMP) gel Apply topically 2 (two) times daily. 12/29/16  Yes Casey BurkittFitzgerald, Hillary Moen, MD  clotrimazole (GYNE-LOTRIMIN 3) 2 % vaginal cream Place 1 Applicatorful vaginally at bedtime. 12/02/16  Yes Funches, Josalyn, MD  cyclobenzaprine (FLEXERIL) 10 MG tablet Take 1 tablet (10 mg total) by mouth 3 (three) times daily as needed for muscle spasms. 12/02/16  Yes Funches, Josalyn, MD  fexofenadine (ALLEGRA) 180 MG tablet Take 1 tablet by mouth 1 day or 1 dose. 03/18/17  Yes [provider]  fluticasone (FLONASE) 50 MCG/ACT nasal spray Place 1 spray into both nostrils daily. 12/02/16  Yes Funches, Josalyn, MD  gabapentin (NEURONTIN) 300 MG capsule Take 1 capsule (300 mg total) by mouth 2 (two) times daily. 12/02/16  Yes Funches, Gerilyn NestleJosalyn, MD  naproxen (NAPROSYN) 500 MG tablet Take 1 tablet (500 mg total) by mouth 2 (two) times daily with a meal. 12/02/16  Yes Funches, Josalyn, MD  topiramate (TOPAMAX) 25 MG tablet Take 1 tablet (25 mg total) by mouth daily. 01/07/16  Yes Patel, Donika K, DO  traMADol (ULTRAM) 50 MG tablet Take 1-2 tablets (50-100 mg total) by mouth 3 (three) times daily as needed. 12/02/16  Yes Dessa PhiFunches, Josalyn, MD  ALLERGIES:  Allergies  Allergen Reactions  . Other Anaphylaxis    "20 different types of trees"  . Latex Hives and Itching  . Zofran Itching     SOCIAL HISTORY:  Social History   Socioeconomic History  . Marital status: Divorced    Spouse name: Not on file  . Number of children: 3  . Years of education: 52   . Highest education level: Not on file  Social Needs  . Financial resource strain: Not on file  . Food insecurity - worry: Not on file  . Food insecurity - inability: Not on file  . Transportation needs - medical: Not on  file  . Transportation needs - non-medical: Not on file  Occupational History    Employer: [employer]  Tobacco Use  . Smoking status: Never Smoker  . Smokeless tobacco: Never Used  Substance and Sexual Activity  . Alcohol use: No    Alcohol/week: 0.0 oz  . Drug use: No  . Sexual activity: Not Currently    Birth control/protection: None    Comment: not since last MAU visit  Other Topics Concern  . Not on file  Social History Narrative   Lives with 3 children.   Immediate family in [place].    Born in [place].   Raised in [place], [place].    Lives in a 2 story home.   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 Asthma Mother   . Heart disease Mother   . Diabetes Mother   . Hypertension Mother   . Hypertension Father   . Asthma Maternal Aunt   . Hypertension Maternal Aunt   . Diabetes Maternal Aunt   . Asthma Maternal Uncle   . Hypertension Maternal Uncle   . Diabetes Maternal Uncle   . Heart disease Maternal Grandmother   . Diabetes Maternal Grandmother   . Hypertension Maternal Grandmother   . Heart disease Maternal Grandfather   . Diabetes Maternal Grandfather   . Hypertension Maternal Grandfather   . Hypotension Neg Hx   . Anesthesia problems Neg Hx   . Malignant hyperthermia Neg Hx   . Pseudochol deficiency Neg Hx   . Cancer Neg Hx     Otherwise negative/non-contributory.  REVIEW OF SYSTEMS:  Constitutional: denies any other weight loss, fever, chills, or sweats  Eyes: denies any other vision changes, history of eye injury  ENT: denies sore throat, hearing problems  Respiratory: denies shortness of breath, wheezing  Cardiovascular: denies chest pain, palpitations  Gastrointestinal: denies abdominal pain, N/V, or  diarrhea Musculoskeletal: denies any other joint pains or cramps  Skin: Denies any other rashes or skin discolorations except as per HPI Neurological: denies any other headache, dizziness, weakness  Psychiatric: Denies any other depression, anxiety   All other review of systems were otherwise negative   VITAL SIGNS:  BP 131/86   Pulse 88   Temp 98.2 F (36.8 C) (Oral)   Ht 5\' 2"  (1.575 m)   Wt 254 lb (115.2 kg)   BMI 46.46 kg/m   PHYSICAL EXAM:  Constitutional:  -- Obese body habitus  -- Awake, alert, and oriented x3  Eyes:  -- Pupils equally round and reactive to light  -- No scleral icterus  Ear, nose, throat:  --  No jugular venous distension -- No nasal drainage, bleeding Pulmonary:  -- No crackles  -- Equal breath sounds bilaterally -- Breathing non-labored at rest Cardiovascular:  -- S1, S2 present  -- No pericardial rubs  Gastrointestinal:  -- Abdomen soft, nontender, non-distended, no guarding/rebound  -- No abdominal masses appreciated, pulsatile or otherwise  Musculoskeletal and Integumentary:  -- Wounds or skin discoloration: 1 cm x 3 (top and occipital x 2) + 1.5 - 2 cm x 1 top front -- Extremities: B/L UE and LE FROM, hands and feet warm  Neurologic:  -- Motor function: Intact and symmetric -- Sensation: Intact and symmetric  Labs:  CBC:  Lab Results  Component Value Date   WBC 8.6 07/15/2016   RBC 4.17 07/15/2016   BMP:  Lab Results  Component Value Date   GLUCOSE 105 (H) 07/15/2016   CO2 24 07/15/2016   BUN 8 07/15/2016   CREATININE 0.68 07/15/2016   CREATININE 0.65 01/11/2014   CALCIUM 8.6 (L) 07/15/2016     Imaging studies: No new pertinent imaging studies available fo review   Assessment/Plan:  32 y.o. female with multiple symptomatic scalp sebaceous/epidermoid inclusion cysts, complicated by co-morbidities including morbid obesity (BMI: 47), asthma, GERD, chronic migraine headaches, major depression disorder, and generalized anxiety  disorder.   - keep affected sites clean  - all risks, benefits, and alternatives to excision of multiple painful scalp masses were discussed with the patient, all of her questions were answered to her expressed satisfaction, patient expresses she wishes to proceed, and informed consent was obtained.  - will plan for elective ambulatory excision of multiple painful scalp masses/lesions on 1/16   - return to clinic 2 weeks after procedure, instructed to call if any questions or concerns  All of the above recommendations were discussed with the patient, and all of patient's questions were answered to her expressed satisfaction.  Thank you for the opportunity to participate in this patient's care.  -- Scherrie Gerlach Earlene Plater, MD, RPVI [place]: [facility] General Surgery - Partnering for exceptional care. Office: [phone]

## 2017-05-26 ENCOUNTER — Telehealth: Payer: Self-pay | Admitting: Surgery

## 2017-05-26 ENCOUNTER — Encounter: Payer: Self-pay | Admitting: Surgery

## 2017-05-26 NOTE — Telephone Encounter (Signed)
I have called patient to go over surgery information. Patient was not able to talk on the phone at this time because she was at work.  pre op date/time and sx date. Sx: 06/01/17 with Dr. Irene Limboavis--excsion of sebaceous cyst x's 2 on scalp.  Pre op: 05/27/17 between 1-5:00pm--phone interview.   Make patient aware to call [phone], between 1-3:00pm the day before surgery, to find out what time to arrive.

## 2017-05-27 ENCOUNTER — Other Ambulatory Visit: Payer: Self-pay

## 2017-05-27 ENCOUNTER — Encounter
Admission: RE | Admit: 2017-05-27 | Discharge: 2017-05-27 | Disposition: A | Discharge status: Home / Self Care | Payer: Medicaid Other | Source: Ambulatory Visit | Attending: Surgery | Admitting: Surgery

## 2017-05-27 HISTORY — DX: Anemia, unspecified: D64.9

## 2017-05-27 HISTORY — DX: Prediabetes: R73.03

## 2017-05-27 NOTE — Patient Instructions (Signed)
  [facility] is [facility]: [facility] Surgery 2nd floor medical mall [facility](Medical Mall Entrance-take elevator on left to 2nd floor.  Check in with surgery information desk.) To find out your arrival time please call [phone] between 1PM - 3PM on 05-31-17 TUESDAY  Remember: Instructions that are not followed completely may result in serious medical risk, up to and including death, or upon the discretion of your surgeon and anesthesiologist your surgery may need to be rescheduled.    _x___ 1. Do not eat food after midnight the night before your procedure. NO GUM OR CANDY AFTER MIDNIGHT.  You may drink clear liquids up to 2 hours before you are scheduled to arrive at the hospital for your procedure.  Do not drink clear liquids within 2 hours of your scheduled arrival to the hospital.  Clear liquids include  --Water or Apple juice without pulp  --Clear carbohydrate beverage such as ClearFast or Gatorade  --Black Coffee or Clear Tea (No milk, no creamers, do not add anything to the coffee or Tea     __x__ 2. No Alcohol for 24 hours before or after surgery.   __x__3. No Smoking for 24 prior to surgery.   ____  4. Bring all medications with you on the day of surgery if instructed.    __x__ 5. Notify your doctor if there is any change in your medical condition     (cold, fever, infections).     Do not wear jewelry, make-up, hairpins, clips or nail polish.  Do not wear lotions, powders, or perfumes. You may wear deodorant.  Do not shave 48 hours prior to surgery. Men may shave face and neck.  Do not bring valuables to the hospital.    [facility] is not responsible for any belongings or valuables.               Contacts, dentures or bridgework may not be worn into surgery.  Leave your suitcase in the car. After surgery it may be brought to your room.  For patients admitted to the hospital, discharge time is determined by your treatment team.   Patients  discharged the day of surgery will not be allowed to drive home.  You will need someone to drive you home and stay with you the night of your procedure.    Please read over the following fact sheets that you were given:   [facility] Preparing for Surgery and or MRSA Information   ____ Take anti-hypertensive listed below, cardiac, seizure, asthma, anti-reflux and psychiatric medicines. These include:  1. NONE  2.  3.  4.  5.  6.  ____Fleets enema or Magnesium Citrate as directed.   ____ Use CHG Soap or sage wipes as directed on instruction sheet   _X___ Use inhalers on the day of surgery and bring to hospital day of surgery-USE YOUR NEBULIZER AT HOME AND BRING ALBUTEROL INHALER TO HOSPITAL  ____ Stop Metformin and Janumet 2 days prior to surgery.    ____ Take 1/2 of usual insulin dose the night before surgery and none on the morning surgery.   ____ Follow recommendations from Cardiologist, Pulmonologist or PCP regarding stopping Aspirin, Coumadin, Plavix ,Eliquis, Effient, or Pradaxa, and Pletal.  X____Stop Anti-inflammatories such as Advil, Aleve, Ibuprofen, Motrin, NAPROXEN, Naprosyn, Goodies powders or aspirin products NOW- OK to take Tylenol    ____ Stop supplements until after surgery.    ____ Bring C-Pap to the hospital.

## 2017-05-30 ENCOUNTER — Encounter
Admission: RE | Admit: 2017-05-30 | Discharge: 2017-05-30 | Disposition: A | Discharge status: Home / Self Care | Payer: Medicaid Other | Source: Ambulatory Visit | Attending: Surgery | Admitting: Surgery

## 2017-05-30 DIAGNOSIS — L723 Sebaceous cyst: Secondary | ICD-10-CM | POA: Diagnosis not present

## 2017-05-30 DIAGNOSIS — Z01818 Encounter for other preprocedural examination: Secondary | ICD-10-CM | POA: Diagnosis present

## 2017-05-30 LAB — BASIC METABOLIC PANEL
ANION GAP: 8 (ref 5–15)
BUN: 10 mg/dL (ref 6–20)
CALCIUM: 8.5 mg/dL — AB (ref 8.9–10.3)
CO2: 23 mmol/L (ref 22–32)
Chloride: 106 mmol/L (ref 101–111)
Creatinine, Ser: 0.75 mg/dL (ref 0.44–1.00)
GFR calc Af Amer: >60 mL/min (ref >60)
GLUCOSE: 90 mg/dL (ref 65–99)
POTASSIUM: 3.7 mmol/L (ref 3.5–5.1)
SODIUM: 137 mmol/L (ref 135–145)

## 2017-05-30 LAB — HEMOGLOBIN: HEMOGLOBIN: 11.2 g/dL — AB (ref 12.0–16.0)

## 2017-05-31 ENCOUNTER — Encounter (HOSPITAL_COMMUNITY): Payer: Self-pay

## 2017-05-31 ENCOUNTER — Inpatient Hospital Stay (HOSPITAL_COMMUNITY)
Admission: AD | Admit: 2017-05-31 | Discharge: 2017-05-31 | Disposition: A | Discharge status: Home / Self Care | Payer: Medicaid Other | Source: Ambulatory Visit | Attending: Family Medicine | Admitting: Family Medicine

## 2017-05-31 ENCOUNTER — Inpatient Hospital Stay (HOSPITAL_COMMUNITY): Payer: Medicaid Other

## 2017-05-31 ENCOUNTER — Other Ambulatory Visit: Payer: Self-pay

## 2017-05-31 DIAGNOSIS — R109 Unspecified abdominal pain: Secondary | ICD-10-CM | POA: Diagnosis not present

## 2017-05-31 DIAGNOSIS — Z3A01 Less than 8 weeks gestation of pregnancy: Secondary | ICD-10-CM | POA: Diagnosis not present

## 2017-05-31 DIAGNOSIS — O26891 Other specified pregnancy related conditions, first trimester: Secondary | ICD-10-CM | POA: Insufficient documentation

## 2017-05-31 DIAGNOSIS — R1030 Lower abdominal pain, unspecified: Secondary | ICD-10-CM | POA: Insufficient documentation

## 2017-05-31 DIAGNOSIS — O26899 Other specified pregnancy related conditions, unspecified trimester: Secondary | ICD-10-CM

## 2017-05-31 DIAGNOSIS — O3680X0 Pregnancy with inconclusive fetal viability, not applicable or unspecified: Secondary | ICD-10-CM

## 2017-05-31 LAB — WET PREP, GENITAL
SPERM: NONE SEEN
Trich, Wet Prep: NONE SEEN
YEAST WET PREP: NONE SEEN

## 2017-05-31 LAB — URINALYSIS, ROUTINE W REFLEX MICROSCOPIC
Bilirubin Urine: NEGATIVE
GLUCOSE, UA: NEGATIVE mg/dL
KETONES UR: NEGATIVE mg/dL
Leukocytes, UA: NEGATIVE
Nitrite: NEGATIVE
PROTEIN: NEGATIVE mg/dL
Specific Gravity, Urine: 1.01 (ref 1.005–1.030)
pH: 5 (ref 5.0–8.0)

## 2017-05-31 LAB — CBC
HCT: 35.2 % — ABNORMAL LOW (ref 36.0–46.0)
Hemoglobin: 11.4 g/dL — ABNORMAL LOW (ref 12.0–15.0)
MCH: 26.3 pg (ref 26.0–34.0)
MCHC: 32.4 g/dL (ref 30.0–36.0)
MCV: 81.1 fL (ref 78.0–100.0)
PLATELETS: 366 10*3/uL (ref 150–400)
RBC: 4.34 MIL/uL (ref 3.87–5.11)
RDW: 13.9 % (ref 11.5–15.5)
WBC: 10 10*3/uL (ref 4.0–10.5)

## 2017-05-31 LAB — POCT PREGNANCY, URINE: Preg Test, Ur: NEGATIVE

## 2017-05-31 LAB — HCG, QUANTITATIVE, PREGNANCY: HCG, BETA CHAIN, QUANT, S: 37 m[IU]/mL — AB (ref <5)

## 2017-05-31 LAB — HCG, SERUM, QUALITATIVE: Preg, Serum: POSITIVE — AB

## 2017-05-31 NOTE — Progress Notes (Signed)
Pt states she had a positive pregnancy test today.

## 2017-05-31 NOTE — MAU Note (Signed)
Pt states constant cramping starting x 2days. Denies bleeding or abnl vag d/c.  States vaginal irritation x few days.  States nausea  X 2 days without vomiting, diarrhea, or fever.  Has not done any interventions for anything.

## 2017-05-31 NOTE — Discharge Instructions (Signed)
[facility]  ° ° °[facility]       Phone: [phone] ° °[facility] Phone: [phone] ° °[facility] [place]  Phone: [phone] ° °[facility] [place]  Phone: [phone] ° °[facility] [place]  Phone: [phone] ° °[facility]       Phone: [phone] ° °[facility]    Phone: [phone]  ° °[facility]    Phone: [phone] ° °[facility]    Phone: [phone] ° °[facility]    Phone: [phone] ° °[facility]    Phone: [phone] ° °[facility]       Phone: [phone]  ° °[facility]  Phone: [phone] ° °[facility]    Phone: [phone] ° °[facility]   Phone: [phone] ° °[facility]      Phone: [phone] ° °[facility]    Phone: [phone] ° °Safe Medications in Pregnancy  ° °Acne: °Benzoyl Peroxide °Salicylic Acid ° °Backache/Headache: °Tylenol: 2 regular strength every 4 hours OR °             2 Extra strength every 6 hours ° °Colds/Coughs/Allergies: °Benadryl (alcohol free) 25 mg every 6 hours as needed °Breath right strips °Claritin °Cepacol throat lozenges °Chloraseptic throat spray °Cold-Eeze- up to three times per day °Cough drops, alcohol free °Flonase (by prescription only) °Guaifenesin °Mucinex °Robitussin DM (plain only, alcohol free) °Saline nasal spray/drops °Sudafed (pseudoephedrine) & Actifed ** use only after [redacted] weeks gestation and if you do not have high blood pressure °Tylenol °Vicks Vaporub °Zinc lozenges °Zyrtec  ° °Constipation: °Colace °Ducolax suppositories °Fleet enema °Glycerin suppositories °Metamucil °Milk of  magnesia °Miralax °Senokot °Smooth move tea ° °Diarrhea: °Kaopectate °Imodium A-D ° °*NO pepto Bismol ° °Hemorrhoids: °Anusol °Anusol HC °Preparation H °Tucks ° °Indigestion: °Tums °Maalox °Mylanta °Zantac  °Pepcid ° °Insomnia: °Benadryl (alcohol free) 25mg every 6 hours as needed °Tylenol PM °Unisom, no Gelcaps ° °Leg Cramps: °Tums °MagGel ° °Nausea/Vomiting:  °Bonine °Dramamine °Emetrol °Ginger extract °Sea bands °Meclizine  °Nausea medication to take during pregnancy:  °Unisom (doxylamine succinate 25 mg tablets) Take one tablet daily at bedtime. If symptoms are not adequately controlled, the dose can be increased to a maximum recommended dose of two tablets daily (1/2 tablet in the morning, 1/2 tablet mid-afternoon and one at bedtime). °Vitamin B6 100mg tablets. Take one tablet twice a day (up to 200 mg per day). ° °Skin Rashes: °Aveeno products °Benadryl cream or 25mg every 6 hours as needed °Calamine Lotion °1% cortisone cream ° °Yeast infection: °Gyne-lotrimin 7 °Monistat 7 ° ° °**If taking multiple medications, please check labels to avoid duplicating the same active ingredients °**take medication as directed on the label °** Do not exceed 4000 mg of tylenol in 24 hours °**Do not take medications that contain aspirin or ibuprofen ° ° ° ° °

## 2017-05-31 NOTE — MAU Provider Note (Signed)
History     CSN: [id number]  Arrival date and time: 05/31/17 1733   First Provider Initiated Contact with Patient 05/31/17 1838     Chief Complaint  Patient presents with  . Abdominal Cramping  . Morning Sickness   HPI Laurie Reed is a 32 y.o. [id number] at Unknown who presents with a positive home pregnancy test and lower abdominal cramping. She rates the pain a 5/10 and has not tried anything for the pain. She also reports vaginal discharge and irritation. She thinks she has a yeast infection. She also reports nausea with smells but no vomiting.   OB History    Gravida Para Term Preterm AB Living   6 3 3  0 2 3   SAB TAB Ectopic Multiple Live Births   2 0 0 0 3      Past Medical History:  Diagnosis Date  . Acid reflux 2004  . Anemia   . Anxiety 2008  . Asthma 1993   no previous intubations or hospitalizations   . Chlamydia   . Depression 2008   no meds, currenly ok  . Environmental allergies   . Migraine   . Migraines   . Miscarriage   . Ovarian cyst   . Pre-diabetes   . Reflux   . Ulcer of the stomach and intestine 2004  . Urinary tract infection     Past Surgical History:  Procedure Laterality Date  . COLONOSCOPY  2004   . MULTIPLE TOOTH EXTRACTIONS  2015   6 teeth removed   . WISDOM TOOTH EXTRACTION  2007    Family History  Problem Relation Age of Onset  . Asthma Mother   . Heart disease Mother   . Diabetes Mother   . Hypertension Mother   . Hypertension Father   . Asthma Maternal Aunt   . Hypertension Maternal Aunt   . Diabetes Maternal Aunt   . Asthma Maternal Uncle   . Hypertension Maternal Uncle   . Diabetes Maternal Uncle   . Heart disease Maternal Grandmother   . Diabetes Maternal Grandmother   . Hypertension Maternal Grandmother   . Heart disease Maternal Grandfather   . Diabetes Maternal Grandfather   . Hypertension Maternal Grandfather   . Hypotension Neg Hx   . Anesthesia problems Neg Hx   . Malignant hyperthermia Neg Hx   .  Pseudochol deficiency Neg Hx   . Cancer Neg Hx     Social History   Tobacco Use  . Smoking status: Never Smoker  . Smokeless tobacco: Never Used  Substance Use Topics  . Alcohol use: No    Alcohol/week: 0.0 oz  . Drug use: No    Allergies:  Allergies  Allergen Reactions  . Other Anaphylaxis    "20 different types of trees"  . Latex Hives and Itching  . Zofran Itching    Medications Prior to Admission  Medication Sig Dispense Refill Last Dose  . albuterol (ACCUNEB) 0.63 MG/3ML nebulizer solution Inhale 1 ampule by nebulization every six (6) hours as needed for wheezing. 75 mL 5 awhile at Unknown time  . albuterol (PROVENTIL HFA;VENTOLIN HFA) 108 (90 Base) MCG/ACT inhaler Inhale 2 puffs into the lungs every 6 (six) hours as needed for wheezing or shortness of breath. 1 Inhaler 1 Past Month at Unknown time  . clindamycin-benzoyl peroxide (BENZACLIN WITH PUMP) gel Apply topically 2 (two) times daily. (Patient taking differently: Apply 1 application topically 2 (two) times daily. ) 50 g 1 Past Week at Unknown time  .  cyclobenzaprine (FLEXERIL) 10 MG tablet Take 1 tablet (10 mg total) by mouth 3 (three) times daily as needed for muscle spasms. 30 tablet 0 Past Week at Unknown time  . fexofenadine (ALLEGRA) 180 MG tablet Take 1 tablet by mouth every evening.   11 Past Week at Unknown time  . fluticasone (FLONASE) 50 MCG/ACT nasal spray Place 1 spray into both nostrils daily. 16 g 5 Past Week at Unknown time  . gabapentin (NEURONTIN) 300 MG capsule Take 1 capsule (300 mg total) by mouth 2 (two) times daily. (Patient taking differently: Take 300 mg by mouth as needed. ) 60 capsule 5 Past Month at Unknown time  . naproxen (NAPROSYN) 500 MG tablet Take 1 tablet (500 mg total) by mouth 2 (two) times daily with a meal. (Patient taking differently: Take 500 mg by mouth 2 (two) times daily as needed for mild pain. ) 60 tablet 0 Past Week at Unknown time  . topiramate (TOPAMAX) 25 MG tablet Take 1  tablet (25 mg total) by mouth daily. (Patient taking differently: Take 25 mg by mouth daily. ) 30 tablet 11 Past Week at Unknown time  . traMADol (ULTRAM) 50 MG tablet Take 1-2 tablets (50-100 mg total) by mouth 3 (three) times daily as needed. 60 tablet 2 Past Week at Unknown time  . buPROPion (WELLBUTRIN SR) 150 MG 12 hr tablet Take 1 tablet (150 mg total) by mouth 2 (two) times daily. (Patient not taking: Reported on 05/27/2017) 60 tablet 5 Not Taking at Unknown time  . clotrimazole (GYNE-LOTRIMIN 3) 2 % vaginal cream Place 1 Applicatorful vaginally at bedtime. (Patient not taking: Reported on 05/27/2017) 21 g 0 Completed Course at Unknown time    Review of Systems  Constitutional: Negative.  Negative for fatigue and fever.  HENT: Negative.   Respiratory: Negative.  Negative for shortness of breath.   Cardiovascular: Negative.  Negative for chest pain.  Gastrointestinal: Positive for abdominal pain. Negative for constipation, diarrhea, nausea and vomiting.  Genitourinary: Positive for vaginal discharge. Negative for dysuria.  Neurological: Negative.  Negative for dizziness and headaches.   Physical Exam   Blood pressure 134/85, pulse 98, temperature 98.4 F (36.9 C), temperature source Oral, resp. rate 16, last menstrual period 05/01/2017, SpO2 100 %.  Physical Exam  Nursing note and vitals reviewed. Constitutional: She is oriented to person, place, and time. She appears well-developed and well-nourished. No distress.  HENT:  Head: Normocephalic.  Eyes: Pupils are equal, round, and reactive to light.  Cardiovascular: Normal rate, regular rhythm and normal heart sounds.  Respiratory: Effort normal and breath sounds normal. No respiratory distress.  GI: Soft. Bowel sounds are normal. She exhibits no distension. There is no tenderness.  Neurological: She is alert and oriented to person, place, and time.  Skin: Skin is warm and dry.  Psychiatric: She has a normal mood and affect. Her  behavior is normal. Judgment and thought content normal.    MAU Course  Procedures Results for orders placed or performed during the hospital encounter of 05/31/17 (from the past 24 hour(s))  Urinalysis, Routine w reflex microscopic     Status: Abnormal   Collection Time: 05/31/17  6:12 PM  Result Value Ref Range   Color, Urine YELLOW YELLOW   APPearance CLEAR CLEAR   Specific Gravity, Urine 1.010 1.005 - 1.030   pH 5.0 5.0 - 8.0   Glucose, UA NEGATIVE NEGATIVE mg/dL   Hgb urine dipstick MODERATE (A) NEGATIVE   Bilirubin Urine NEGATIVE NEGATIVE  Ketones, ur NEGATIVE NEGATIVE mg/dL   Protein, ur NEGATIVE NEGATIVE mg/dL   Nitrite NEGATIVE NEGATIVE   Leukocytes, UA NEGATIVE NEGATIVE   RBC / HPF 0-5 0 - 5 RBC/hpf   WBC, UA 0-5 0 - 5 WBC/hpf   Bacteria, UA RARE (A) NONE SEEN   Squamous Epithelial / LPF 0-5 (A) NONE SEEN   Mucus PRESENT   Pregnancy, urine POC     Status: None   Collection Time: 05/31/17  6:26 PM  Result Value Ref Range   Preg Test, Ur NEGATIVE NEGATIVE  hCG, serum, qualitative     Status: Abnormal   Collection Time: 05/31/17  6:56 PM  Result Value Ref Range   Preg, Serum POSITIVE (A) NEGATIVE  CBC     Status: Abnormal   Collection Time: 05/31/17  6:57 PM  Result Value Ref Range   WBC 10.0 4.0 - 10.5 K/uL   RBC 4.34 3.87 - 5.11 MIL/uL   Hemoglobin 11.4 (L) 12.0 - 15.0 g/dL   HCT 40.9 (L) 81.1 - 91.4 %   MCV 81.1 78.0 - 100.0 fL   MCH 26.3 26.0 - 34.0 pg   MCHC 32.4 30.0 - 36.0 g/dL   RDW 78.2 95.6 - 21.3 %   Platelets 366 150 - 400 K/uL  hCG, quantitative, pregnancy     Status: Abnormal   Collection Time: 05/31/17  6:57 PM  Result Value Ref Range   hCG, Beta Chain, Quant, S 37 (H) <5 mIU/mL  Wet prep, genital     Status: Abnormal   Collection Time: 05/31/17  7:16 PM  Result Value Ref Range   Yeast Wet Prep HPF POC NONE SEEN NONE SEEN   Trich, Wet Prep NONE SEEN NONE SEEN   Clue Cells Wet Prep HPF POC PRESENT (A) NONE SEEN   WBC, Wet Prep HPF POC  MODERATE (A) NONE SEEN   Sperm NONE SEEN    US Ob Less Than 14 Weeks With Ob Transvaginal  Result Date: 05/31/2017 CLINICAL DATA:  Cramping for 2 days. EXAM: OBSTETRIC <14 WK [place] AND TRANSVAGINAL OB US TECHNIQUE: Both transabdominal and transvaginal ultrasound examinations were performed for complete evaluation of the gestation as well as the maternal uterus, adnexal regions, and pelvic cul-de-sac. Transvaginal technique was performed to assess early pregnancy. COMPARISON:  None. FINDINGS: Intrauterine gestational sac: Not seen Subchorionic hemorrhage:  None visualized. Maternal uterus/adnexae: No mass or fluid identified within the endometrial canal. Small dominant follicle versus corpus luteum within the right ovary measures 1.4 cm. Bilateral ovaries otherwise unremarkable and there is no mass or free fluid seen within either adnexal region. Trace simple appearing free fluid in the cul-de-sac is likely physiologic in nature. IMPRESSION: 1. No intrauterine pregnancy identified. 2. Ovaries are unremarkable. No mass or fluid identified within either adnexal region. If beta HCG positive, early/occult ectopic pregnancy could not be excluded, and follow-up with serial beta HCG levels and pelvic ultrasound as needed would be recommended. Electronically Signed   By: Bary Richard M.D.   On: 05/31/2017 20:24    MDM UA UPT- negative CBC, HCG, ABO/Rh Wet prep and gc/chlamydia US OB Transvaginal US OB Comp Less 14 weeks- no IUP or ectopic pregnancy seen  Assessment and Plan   1. Pregnancy of unknown anatomic location   2. Abdominal pain affecting pregnancy   3. [redacted] weeks gestation of pregnancy    -Discharge home in stable condition -Ectopic precautions discussed -Patient advised [facility] for stat bHCG -Patient  may return to MAU as needed or if her condition were to change or worsen  Rolm BookbinderCaroline M Seidy Labreck CNM 05/31/2017, 9:39 PM

## 2017-06-01 ENCOUNTER — Encounter
Admission: RE | Disposition: A | Discharge status: Home / Self Care | Payer: Self-pay | Source: Ambulatory Visit | Attending: Surgery

## 2017-06-01 ENCOUNTER — Other Ambulatory Visit: Payer: Self-pay

## 2017-06-01 ENCOUNTER — Ambulatory Visit
Admission: RE | Admit: 2017-06-01 | Discharge: 2017-06-01 | Disposition: A | Discharge status: Home / Self Care | Payer: Medicaid Other | Source: Ambulatory Visit | Attending: Surgery | Admitting: Surgery

## 2017-06-01 ENCOUNTER — Ambulatory Visit: Payer: Medicaid Other | Admitting: Certified Registered Nurse Anesthetist

## 2017-06-01 ENCOUNTER — Encounter: Payer: Self-pay | Admitting: Anesthesiology

## 2017-06-01 DIAGNOSIS — F329 Major depressive disorder, single episode, unspecified: Secondary | ICD-10-CM | POA: Insufficient documentation

## 2017-06-01 DIAGNOSIS — L739 Follicular disorder, unspecified: Secondary | ICD-10-CM | POA: Insufficient documentation

## 2017-06-01 DIAGNOSIS — L723 Sebaceous cyst: Secondary | ICD-10-CM | POA: Diagnosis not present

## 2017-06-01 DIAGNOSIS — Z6841 Body Mass Index (BMI) 40.0 and over, adult: Secondary | ICD-10-CM | POA: Diagnosis not present

## 2017-06-01 DIAGNOSIS — J45909 Unspecified asthma, uncomplicated: Secondary | ICD-10-CM | POA: Diagnosis not present

## 2017-06-01 DIAGNOSIS — F411 Generalized anxiety disorder: Secondary | ICD-10-CM | POA: Insufficient documentation

## 2017-06-01 DIAGNOSIS — K219 Gastro-esophageal reflux disease without esophagitis: Secondary | ICD-10-CM | POA: Insufficient documentation

## 2017-06-01 DIAGNOSIS — Z79899 Other long term (current) drug therapy: Secondary | ICD-10-CM | POA: Diagnosis not present

## 2017-06-01 HISTORY — PX: IRRIGATION AND DEBRIDEMENT SEBACEOUS CYST: SHX5255

## 2017-06-01 LAB — GC/CHLAMYDIA PROBE AMP (~~LOC~~) NOT AT ARMC
Chlamydia: NEGATIVE
NEISSERIA GONORRHEA: NEGATIVE

## 2017-06-01 SURGERY — IRRIGATION AND DEBRIDEMENT SEBACEOUS CYST
Anesthesia: Choice | Site: Head | Wound class: Clean

## 2017-06-01 MED ORDER — BUPIVACAINE-EPINEPHRINE (PF) 0.25% -1:200000 IJ SOLN
INTRAMUSCULAR | Status: AC
  Filled 2017-06-01: qty 30

## 2017-06-01 MED ORDER — FENTANYL CITRATE (PF) 100 MCG/2ML IJ SOLN
INTRAMUSCULAR | Status: AC
  Filled 2017-06-01: qty 2

## 2017-06-01 MED ORDER — LIDOCAINE HCL (PF) 1 % IJ SOLN
INTRAMUSCULAR | Status: AC
  Filled 2017-06-01: qty 30

## 2017-06-01 MED ORDER — PROPOFOL 10 MG/ML IV BOLUS
INTRAVENOUS | Status: AC
  Filled 2017-06-01: qty 20

## 2017-06-01 MED ORDER — LIDOCAINE HCL 1 % IJ SOLN
INTRAMUSCULAR | Status: DC | PRN
  Administered 2017-06-01: 6 mL via SUBCUTANEOUS

## 2017-06-01 MED ORDER — BUPIVACAINE HCL (PF) 0.5 % IJ SOLN
INTRAMUSCULAR | Status: AC
  Filled 2017-06-01: qty 30

## 2017-06-01 MED ORDER — PHENYLEPHRINE HCL 10 MG/ML IJ SOLN
INTRAMUSCULAR | Status: AC
  Filled 2017-06-01: qty 1

## 2017-06-01 MED ORDER — FAMOTIDINE 20 MG PO TABS: 20.0000 mg | ORAL_TABLET | Freq: Once | ORAL | Status: DC

## 2017-06-01 MED ORDER — CHLORHEXIDINE GLUCONATE CLOTH 2 % EX PADS: 6.0000 | MEDICATED_PAD | Freq: Once | CUTANEOUS | Status: DC

## 2017-06-01 MED ORDER — ACETAMINOPHEN 500 MG PO TABS
ORAL_TABLET | ORAL | Status: AC
  Filled 2017-06-01: qty 2

## 2017-06-01 MED ORDER — CEFAZOLIN SODIUM-DEXTROSE 2-4 GM/100ML-% IV SOLN
INTRAVENOUS | Status: AC
  Administered 2017-06-01: 2 g via INTRAVENOUS
  Filled 2017-06-01: qty 100

## 2017-06-01 MED ORDER — CEFAZOLIN SODIUM-DEXTROSE 2-4 GM/100ML-% IV SOLN
2.0000 g | INTRAVENOUS | Status: AC
  Administered 2017-06-01: 2 g via INTRAVENOUS

## 2017-06-01 MED ORDER — BACITRACIN ZINC 500 UNIT/GM EX OINT
TOPICAL_OINTMENT | CUTANEOUS | Status: AC
  Filled 2017-06-01: qty 28.35

## 2017-06-01 MED ORDER — LACTATED RINGERS IV SOLN
INTRAVENOUS | Status: DC
  Administered 2017-06-01: 07:00:00 via INTRAVENOUS

## 2017-06-01 MED ORDER — MIDAZOLAM HCL 2 MG/2ML IJ SOLN
INTRAMUSCULAR | Status: AC
  Filled 2017-06-01: qty 2

## 2017-06-01 MED ORDER — SUGAMMADEX SODIUM 200 MG/2ML IV SOLN
INTRAVENOUS | Status: AC
Start: 2017-06-01 — End: 2017-06-01
  Filled 2017-06-01: qty 2

## 2017-06-01 MED ORDER — ACETAMINOPHEN 500 MG PO TABS
1000.0000 mg | ORAL_TABLET | Freq: Once | ORAL | Status: AC
  Administered 2017-06-01: 1000 mg via ORAL

## 2017-06-01 MED ORDER — LIDOCAINE HCL (PF) 2 % IJ SOLN
INTRAMUSCULAR | Status: AC
  Filled 2017-06-01: qty 10

## 2017-06-01 SURGICAL SUPPLY — 40 items
ADH LQ OCL WTPRF AMP STRL LF (MISCELLANEOUS)
ADH SKN CLS APL DERMABOND .7 (GAUZE/BANDAGES/DRESSINGS)
ADHESIVE MASTISOL STRL (MISCELLANEOUS) ×1 IMPLANT
APPLIER CLIP 9.375 SM OPEN (CLIP)
APR CLP SM 9.3 20 MLT OPN (CLIP)
BLADE CLIPPER SURG (BLADE) ×2 IMPLANT
BLADE SURG 15 STRL LF DISP TIS (BLADE) ×1 IMPLANT
BLADE SURG 15 STRL SS (BLADE) ×3
CANISTER SUCT 1200ML W/VALVE (MISCELLANEOUS) ×3 IMPLANT
CHLORAPREP W/TINT 26ML (MISCELLANEOUS) ×1 IMPLANT
CLIP APPLIE 9.375 SM OPEN (CLIP) IMPLANT
CLOSURE WOUND 1/2 X4 (GAUZE/BANDAGES/DRESSINGS)
DECANTER SPIKE VIAL GLASS SM (MISCELLANEOUS) ×2 IMPLANT
DERMABOND ADVANCED (GAUZE/BANDAGES/DRESSINGS)
DERMABOND ADVANCED .7 DNX12 (GAUZE/BANDAGES/DRESSINGS) IMPLANT
DRAPE LAPAROTOMY 77X122 PED (DRAPES) ×3 IMPLANT
ELECT REM PT RETURN 9FT ADLT (ELECTROSURGICAL) ×3
ELECTRODE REM PT RTRN 9FT ADLT (ELECTROSURGICAL) ×1 IMPLANT
GAUZE SPONGE 4X4 12PLY STRL (GAUZE/BANDAGES/DRESSINGS) ×3 IMPLANT
GLOVE BIO SURGEON STRL SZ8 (GLOVE) ×2 IMPLANT
GOWN STRL REUS W/ TWL LRG LVL3 (GOWN DISPOSABLE) ×2 IMPLANT
GOWN STRL REUS W/TWL LRG LVL3 (GOWN DISPOSABLE) ×9
KIT RM TURNOVER STRD PROC AR (KITS) ×3 IMPLANT
LABEL OR SOLS (LABEL) ×3 IMPLANT
NDL HYPO 25X1 1.5 SAFETY (NEEDLE) ×1 IMPLANT
NEEDLE HYPO 25X1 1.5 SAFETY (NEEDLE) ×3 IMPLANT
NS IRRIG 500ML POUR BTL (IV SOLUTION) ×3 IMPLANT
PACK BASIN MINOR ARMC (MISCELLANEOUS) ×3 IMPLANT
SPONGE LAP 18X18 5 PK (GAUZE/BANDAGES/DRESSINGS) ×1 IMPLANT
SPONGE XRAY 4X4 16PLY STRL (MISCELLANEOUS) ×2 IMPLANT
STRIP CLOSURE SKIN 1/2X4 (GAUZE/BANDAGES/DRESSINGS) ×1 IMPLANT
SUT ETHILON 3-0 FS-10 30 BLK (SUTURE) ×3
SUT MNCRL 4-0 (SUTURE) ×3
SUT MNCRL 4-0 27XMFL (SUTURE) ×1
SUT VIC AB 2-0 CT2 27 (SUTURE) ×2 IMPLANT
SUT VIC AB 3-0 SH 27 (SUTURE) ×3
SUT VIC AB 3-0 SH 27X BRD (SUTURE) ×2 IMPLANT
SUTURE EHLN 3-0 FS-10 30 BLK (SUTURE) IMPLANT
SUTURE MNCRL 4-0 27XMF (SUTURE) ×1 IMPLANT
SYR 10ML LL (SYRINGE) ×3 IMPLANT

## 2017-06-01 NOTE — Care Management (Signed)
Pt is pregnant. LOCAL.

## 2017-06-01 NOTE — Op Note (Signed)
SURGICAL OPERATIVE REPORT  DATE OF PROCEDURE: 06/01/2017  ATTENDING Surgeon(s): Ancil Linseyavis, Montserrat Shek Evan, MD  ANESTHESIA: Local  PRE-OPERATIVE DIAGNOSIS: Symptomatic (painful) sebaceous cyst of the apical scalp (icd-10: L72.3)  POST-OPERATIVE DIAGNOSIS: Symptomatic (painful) sebaceous cyst of the apical scalp (icd-10: L72.3)  PROCEDURE(S):  1.) Excision of symptomatic (painful) sebaceous cyst/mass of the scalp (cpt: 21011)  INTRAOPERATIVE FINDINGS: 1.5 cm x 1.5 cm non-infected subcutaneous mass of the apical scalp (most likely a sebaceous cyst), removed intact  INTRAVENOUS FLUIDS: 0 mL crystalloid   ESTIMATED BLOOD LOSS: Minimal (< 20 mL)  URINE OUTPUT: No Foley  SPECIMENS: 1.5 cm x 1.5 cm non-infected subcutaneous mass of the apical scalp (most likely a sebaceous cyst), removed intact  IMPLANTS: None  DRAINS: None  COMPLICATIONS: None apparent  CONDITION AT END OF PROCEDURE: Hemodynamically stable and awake  DISPOSITION OF PATIENT: PACU  INDICATIONS FOR PROCEDURE:  Patient is a 32 y.o. female who presented for a symptomatic (painful) sebaceous cyst/mass of the apical scalp. Patient reports it's been present and growing for years, periodically grows in size, becomes more painful, regresses, and recently has become increasingly painful due in part to its location. Patient has also noted the site has developed a bald spot of impaired hair growth over the mass. Patient requested that it be removed for alleviation of symptoms, andpatient was accordingly referred for surgical evaluation and management.All risks, benefits, and alternatives to above procedure were discussed with the patient, all of patient's questions were answered to his expressed satisfaction, and informed consent was obtained and documented. On the day before planned procedure, patient was found to be newly pregnant, but nonetheless arrived on day of planned procedure. Rescheduling and completion of procedure with  local anesthetic alone were discuss, and patient elected to proceed.  DETAILS OF PROCEDURE: Patient was brought to the operating suite and was appropriately identified. Laying on the OR table in supine position, operative site was clipped, prepped with betadine, and draped in the usual sterile fashion, and following a brief time out, a 2.5 cm long and 1 cm wide elliptical incision was made using a #15 blade scalpel, and incision was extended deep around subcutaneous mass using sharp + blunt dissection and selective electrocautery. During the course of dissecting free the first (larger) of 2 apical scalp cysts, it was not disrupted and was removed intact. Direct pressure and electrocautery were utilized for hemostasis. Further examination did not reveal any additional mass/cyst. Hemostasis was achieved, and the wound was copiously irrigated with sterile warm saline. Deep tissue and dermis were re-approximated using buried interrupted 3-0 Vicryl suture, and vertical mattress 3-0 Nylon suture was used to re-approximate epidermis. Throughout the procedure, patient's pain was well-controlled with local anesthetic, but she remained very anxious about having drapes over her even part of her face (even after tented away from her face) and the smell of electrocautery. Accordingly, patient requested to defer excision of second smaller cyst until she is no longer pregnant and can have anesthesia. Skin was then cleaned and dried, and sterile bacitracin applied to the sutures to reduce the dressing from sticking to the sutures, and a gauze and paper tape dressing was applied. Patient was then safely transferred to recovery for post-procedural monitoring and care.  I was present for all aspects of the above procedure, and there were no complications apparent.

## 2017-06-01 NOTE — OR Nursing (Signed)
Dr. Earlene Plateravis in to see patient and family member [phone]

## 2017-06-01 NOTE — OR Nursing (Signed)
Patient has a positive beta hcg. Dr. Earlene Plateravis notified. He has spoken to the patient and they have decided to do her under straight local. Iv started to give IV antibiotic.

## 2017-06-01 NOTE — Discharge Instructions (Signed)
In addition to included general post-operative instructions for Excision of Scalp Mass,  Diet: Resume home heart healthy diet.   Activity: No heavy lifting >20 pounds (children, pets, laundry, garbage) or strenuous activity until follow-up, but light activity and walking are encouraged. Do not drive or drink alcohol if taking narcotic pain medications.  Wound care: Remove dressing in 2 days. Once dressing removed, 2 days after surgery (Friday, 1/18), may shower/get incision wet with soapy water and pat dry (do not rub incisions), but no submerging incision underwater until follow-up.   Medications: Resume all home medications. For mild to moderate pain: acetaminophen (Tylenol). Combining Tylenol with alcohol can substantially increase your risk of causing liver disease. Narcotic pain medications, if prescribed, can be used for severe pain, though may cause nausea, constipation, and drowsiness. Do not combine Tylenol and Percocet/Ultracet (or similar) within a 6 hour period as Percocet (and similar) contain Tylenol.  Call office [phone]) at any time if any questions, worsening pain, fevers/chills, bleeding, drainage from incision site, or other concerns.

## 2017-06-01 NOTE — Interval H&P Note (Signed)
History and Physical Interval Note:  06/01/2017 7:27 AM  Laurie Reed  has presented today for surgery, with the diagnosis of SEBACEOUS CYST  The various methods of treatment have been discussed with the patient and family. After consideration of risks, benefits and other options for treatment, the patient has consented to  Procedure(s): EXCISION SEBACEOUS CYSTx 2 ON SCALP (N/A) as a surgical intervention .  The patient's history has been reviewed, patient examined, no change in status, stable for surgery.  I have reviewed the patient's chart and labs.  Questions were answered to the patient's satisfaction.     Ancil LinseyJason Evan [name]

## 2017-06-02 ENCOUNTER — Encounter: Payer: Self-pay | Admitting: Surgery

## 2017-06-02 LAB — SURGICAL PATHOLOGY

## 2017-06-03 ENCOUNTER — Ambulatory Visit: Payer: Medicaid Other | Admitting: *Deleted

## 2017-06-03 DIAGNOSIS — O3680X0 Pregnancy with inconclusive fetal viability, not applicable or unspecified: Secondary | ICD-10-CM

## 2017-06-03 DIAGNOSIS — L723 Sebaceous cyst: Secondary | ICD-10-CM

## 2017-06-03 DIAGNOSIS — O219 Vomiting of pregnancy, unspecified: Secondary | ICD-10-CM

## 2017-06-03 LAB — HCG, QUANTITATIVE, PREGNANCY: hCG, Beta Chain, Quant, S: 114 m[IU]/mL — ABNORMAL HIGH (ref <5)

## 2017-06-03 MED ORDER — PROMETHAZINE HCL 25 MG PO TABS: 25.0000 mg | ORAL_TABLET | ORAL | 1 refills | Status: DC | PRN

## 2017-06-03 NOTE — Progress Notes (Signed)
She reports for a stat bhcg. Denies pain or bleeding. C/o nausea. Explained to her we will draw hcg and send stat and have her wait in lobby for results.Then will review with provider and her. She voices understanding.   Reviewed results with [name] and informed patient looks like she has a very early pregnancy and we recommend she start prenatal care at 10 weeks. Reviewed ectopic precautions with Laurie Reed. She states she has already scheduled a new ob appt with Laurie Reed at about 10 weeks which I verified.  Also sent in rx for nausea as requested.

## 2017-06-07 ENCOUNTER — Telehealth: Payer: Self-pay

## 2017-06-07 ENCOUNTER — Ambulatory Visit: Payer: Self-pay | Admitting: Internal Medicine

## 2017-06-07 NOTE — Telephone Encounter (Signed)
Contacted pt to see why why she was unable to make it to her appointment and to reschedule pt didn't answer lvm asking pt to give me a call back to reschedule appointment

## 2017-06-08 ENCOUNTER — Encounter: Payer: Self-pay | Admitting: Surgery

## 2017-06-12 ENCOUNTER — Telehealth: Payer: Self-pay | Admitting: Advanced Practice Midwife

## 2017-06-12 ENCOUNTER — Encounter (HOSPITAL_COMMUNITY): Payer: Self-pay

## 2017-06-12 ENCOUNTER — Inpatient Hospital Stay (HOSPITAL_COMMUNITY)
Admission: AD | Admit: 2017-06-12 | Discharge: 2017-06-12 | Disposition: A | Discharge status: Home / Self Care | Payer: Medicaid Other | Source: Ambulatory Visit | Attending: Family Medicine | Admitting: Family Medicine

## 2017-06-12 ENCOUNTER — Inpatient Hospital Stay (HOSPITAL_COMMUNITY): Payer: Medicaid Other

## 2017-06-12 ENCOUNTER — Telehealth: Payer: Self-pay | Admitting: Internal Medicine

## 2017-06-12 ENCOUNTER — Other Ambulatory Visit: Payer: Self-pay | Admitting: Advanced Practice Midwife

## 2017-06-12 DIAGNOSIS — O219 Vomiting of pregnancy, unspecified: Secondary | ICD-10-CM

## 2017-06-12 DIAGNOSIS — D649 Anemia, unspecified: Secondary | ICD-10-CM | POA: Diagnosis not present

## 2017-06-12 DIAGNOSIS — O99011 Anemia complicating pregnancy, first trimester: Secondary | ICD-10-CM | POA: Insufficient documentation

## 2017-06-12 DIAGNOSIS — O210 Mild hyperemesis gravidarum: Secondary | ICD-10-CM | POA: Insufficient documentation

## 2017-06-12 DIAGNOSIS — R109 Unspecified abdominal pain: Secondary | ICD-10-CM | POA: Diagnosis not present

## 2017-06-12 DIAGNOSIS — Z3A01 Less than 8 weeks gestation of pregnancy: Secondary | ICD-10-CM | POA: Diagnosis not present

## 2017-06-12 DIAGNOSIS — R9431 Abnormal electrocardiogram [ECG] [EKG]: Secondary | ICD-10-CM

## 2017-06-12 DIAGNOSIS — O26891 Other specified pregnancy related conditions, first trimester: Secondary | ICD-10-CM

## 2017-06-12 DIAGNOSIS — R42 Dizziness and giddiness: Secondary | ICD-10-CM | POA: Diagnosis present

## 2017-06-12 DIAGNOSIS — Z3491 Encounter for supervision of normal pregnancy, unspecified, first trimester: Secondary | ICD-10-CM

## 2017-06-12 LAB — URINALYSIS, ROUTINE W REFLEX MICROSCOPIC
Bilirubin Urine: NEGATIVE
Glucose, UA: NEGATIVE mg/dL
Ketones, ur: NEGATIVE mg/dL
Leukocytes, UA: NEGATIVE
Nitrite: NEGATIVE
Protein, ur: NEGATIVE mg/dL
Specific Gravity, Urine: 1.018 (ref 1.005–1.030)
pH: 5 (ref 5.0–8.0)

## 2017-06-12 LAB — COMPREHENSIVE METABOLIC PANEL
ALK PHOS: 51 U/L (ref 38–126)
ALT: 13 U/L — ABNORMAL LOW (ref 14–54)
ANION GAP: 6 (ref 5–15)
AST: 14 U/L — ABNORMAL LOW (ref 15–41)
Albumin: 3.5 g/dL (ref 3.5–5.0)
BILIRUBIN TOTAL: 0.4 mg/dL (ref 0.3–1.2)
BUN: 7 mg/dL (ref 6–20)
CALCIUM: 8.7 mg/dL — AB (ref 8.9–10.3)
CO2: 25 mmol/L (ref 22–32)
Chloride: 105 mmol/L (ref 101–111)
Creatinine, Ser: 0.85 mg/dL (ref 0.44–1.00)
GFR calc Af Amer: >60 mL/min (ref >60)
GFR calc non Af Amer: >60 mL/min (ref >60)
Glucose, Bld: 94 mg/dL (ref 65–99)
Potassium: 3.7 mmol/L (ref 3.5–5.1)
Sodium: 136 mmol/L (ref 135–145)
TOTAL PROTEIN: 7.4 g/dL (ref 6.5–8.1)

## 2017-06-12 LAB — CBC
HCT: 34.1 % — ABNORMAL LOW (ref 36.0–46.0)
Hemoglobin: 10.9 g/dL — ABNORMAL LOW (ref 12.0–15.0)
MCH: 26.2 pg (ref 26.0–34.0)
MCHC: 32 g/dL (ref 30.0–36.0)
MCV: 82 fL (ref 78.0–100.0)
Platelets: 326 10*3/uL (ref 150–400)
RBC: 4.16 MIL/uL (ref 3.87–5.11)
RDW: 14.3 % (ref 11.5–15.5)
WBC: 7.2 10*3/uL (ref 4.0–10.5)

## 2017-06-12 MED ORDER — METOCLOPRAMIDE HCL 10 MG PO TABS: 10.0000 mg | ORAL_TABLET | Freq: Three times a day (TID) | ORAL | 5 refills | Status: DC

## 2017-06-12 MED ORDER — METOCLOPRAMIDE HCL 10 MG PO TABS
10.0000 mg | ORAL_TABLET | Freq: Once | ORAL | Status: AC
  Administered 2017-06-12: 10 mg via ORAL
  Filled 2017-06-12: qty 1

## 2017-06-12 MED ORDER — PRENATAL VITAMINS 0.8 MG PO TABS: 1.0000 | ORAL_TABLET | Freq: Every day | ORAL | 12 refills | Status: DC

## 2017-06-12 NOTE — Telephone Encounter (Signed)
Pt had abnormal EKG during her MAU visit today and on further review, I decided to consult cardiology to evaluate.  Per Dr Cristina GongVedre, cardiologist with [facility], pt has evidence of right bundle branch block, similar to previous EKG in 2015.  He does recommend outpatient follow up in 4-6 weeks. Referral sent and called pt to let her know about referral.  Pt is to take her PNV, increase PO iron as previously prescribed and to return to MAU if her dizziness/lightheadedness persists.  Pt given [facility] phone number to follow up if she does not get call about appointment.  Pt states understanding.

## 2017-06-12 NOTE — MAU Provider Note (Signed)
Chief Complaint: Abdominal Pain and Dizziness   First Provider Initiated Contact with Patient 06/12/17 1223      SUBJECTIVE HPI: Laurie Reed is a 32 y.o. [id number] at [id number] by LMP who presents to maternity admissions reporting increased abdominal cramping and intermittent dizziness x 1 week.  Her pain is low in her abdomen, intermittent cramping pain starting 1 week ago. It is the same since onset 1 week ago.  The dizziness started at about the same time and was intermittent, when standing up too quickly but is now more constant. She feels lightheaded and dizzy, mostly when standing and it does improve when sitting down but it is now occurring throughout the day, not just occasionally.  She is not eating as much as usual, she reports nausea and lack of appetite keeping her from eating much.  She is drinking water but reports it is probably not enough.  There are no other associated symptoms. She has not tried any treatments.  She was seen in MAU on 05/31/17 and had US with no IUP or ectopic visualized and hcg of 37. Repeat hcg in 48 hours revealed appropriate rise to 114.  Pt was advised to start prenatal care and has appt on 07/20/17 with Dr Clearance CootsHarper at [facility] [place]. She denies vaginal bleeding, vaginal itching/burning, urinary symptoms, h/a, or fever/chills.     HPI  Past Medical History:  Diagnosis Date  . Acid reflux 2004  . Anemia   . Anxiety 2008  . Asthma 1993   no previous intubations or hospitalizations   . Chlamydia   . Depression 2008   no meds, currenly ok  . Environmental allergies   . Migraine   . Migraines   . Miscarriage   . Ovarian cyst   . Pre-diabetes   . Reflux   . Ulcer of the stomach and intestine 2004  . Urinary tract infection    Past Surgical History:  Procedure Laterality Date  . COLONOSCOPY  2004   . IRRIGATION AND DEBRIDEMENT SEBACEOUS CYST N/A 06/01/2017   Procedure: EXCISION SEBACEOUS CYST ON SCALP;  Surgeon: Ancil Linseyavis, Jason Evan, MD;  Location: [facility] ORS;   Service: General;  Laterality: N/A;  . MULTIPLE TOOTH EXTRACTIONS  2015   6 teeth removed   . WISDOM TOOTH EXTRACTION  2007   Social History   Socioeconomic History  . Marital status: Divorced    Spouse name: Not on file  . Number of children: 3  . Years of education: 112   . Highest education level: Not on file  Social Needs  . Financial resource strain: Not on file  . Food insecurity - worry: Not on file  . Food insecurity - inability: Not on file  . Transportation needs - medical: Not on file  . Transportation needs - non-medical: Not on file  Occupational History    Employer: [employer]  Tobacco Use  . Smoking status: Never Smoker  . Smokeless tobacco: Never Used  Substance and Sexual Activity  . Alcohol use: No    Alcohol/week: 0.0 oz  . Drug use: No  . Sexual activity: Yes    Birth control/protection: None  Other Topics Concern  . Not on file  Social History Narrative   Lives with 3 children.   Immediate family in [place]    Born in [place]   Raised in [place], [place]    Lives in a 2 story home.   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    Current Outpatient Medications on File Prior to Encounter  Medication Sig Dispense Refill  . albuterol (ACCUNEB) 0.63 MG/3ML nebulizer solution Inhale 1 ampule by nebulization every six (6) hours as needed for wheezing. 75 mL 5  . albuterol (PROVENTIL HFA;VENTOLIN HFA) 108 (90 Base) MCG/ACT inhaler Inhale 2 puffs into the lungs every 6 (six) hours as needed for wheezing or shortness of breath. 1 Inhaler 1  . clindamycin-benzoyl peroxide (BENZACLIN WITH PUMP) gel Apply topically 2 (two) times daily. (Patient taking differently: Apply 1 application topically 2 (two) times daily. ) 50 g 1  . fluticasone (FLONASE) 50 MCG/ACT nasal spray Place 1 spray into both nostrils daily. 16 g 5  . gabapentin (NEURONTIN) 300 MG capsule Take 1  capsule (300 mg total) by mouth 2 (two) times daily. (Patient taking differently: Take 300 mg by mouth as needed. ) 60 capsule 5   Allergies  Allergen Reactions  . Other Anaphylaxis    "20 different types of trees"  . Latex Hives and Itching  . Zofran Itching    ROS:  Review of Systems  Constitutional: Positive for appetite change. Negative for chills, fatigue and fever.  Respiratory: Negative for shortness of breath.   Cardiovascular: Negative for chest pain.  Gastrointestinal: Positive for abdominal pain and nausea. Negative for vomiting.  Genitourinary: Positive for pelvic pain. Negative for difficulty urinating, dysuria, flank pain, vaginal bleeding, vaginal discharge and vaginal pain.  Neurological: Positive for dizziness and light-headedness. Negative for headaches.  Psychiatric/Behavioral: Negative.      I have reviewed patient's Past Medical Hx, Surgical Hx, Family Hx, Social Hx, medications and allergies.   Physical Exam   Patient Vitals for the past 24 hrs:  BP Temp Temp src Pulse Resp SpO2 Weight  06/12/17 1431 129/69 - - 96 - - -  06/12/17 1203 118/63 98 F (36.7 C) Oral (!) 101 18 100 % 252 lb 1.9 oz (114.4 kg)   Constitutional: Well-developed, well-nourished female in no acute distress.  Cardiovascular: normal rate Respiratory: normal effort GI: Abd soft, non-tender. Pos BS x 4 MS: Extremities nontender, no edema, normal ROM Neurologic: Alert and oriented x 4.  GU: Neg CVAT.  PELVIC EXAM:Deferred    LAB RESULTS Results for orders placed or performed during the hospital encounter of 06/12/17 (from the past 24 hour(s))  Urinalysis, Routine w reflex microscopic     Status: Abnormal   Collection Time: 06/12/17 11:57 AM  Result Value Ref Range   Color, Urine YELLOW YELLOW   APPearance CLEAR CLEAR   Specific Gravity, Urine 1.018 1.005 - 1.030   pH 5.0 5.0 - 8.0   Glucose, UA NEGATIVE NEGATIVE mg/dL   Hgb urine dipstick SMALL (A) NEGATIVE   Bilirubin  Urine NEGATIVE NEGATIVE   Ketones, ur NEGATIVE NEGATIVE mg/dL   Protein, ur NEGATIVE NEGATIVE mg/dL   Nitrite NEGATIVE NEGATIVE   Leukocytes, UA NEGATIVE NEGATIVE   RBC / HPF 0-5 0 - 5 RBC/hpf   WBC, UA 0-5 0 - 5 WBC/hpf   Bacteria, UA RARE (A) NONE SEEN   Squamous Epithelial / LPF 0-5 (A) NONE SEEN   Mucus PRESENT   CBC     Status: Abnormal   Collection Time: 06/12/17  1:12 PM  Result Value Ref Range   WBC 7.2 4.0 - 10.5 K/uL   RBC 4.16 3.87 - 5.11 MIL/uL   Hemoglobin 10.9 (L) 12.0 - 15.0 g/dL   HCT 91.4 (L) 78.2 -  46.0 %   MCV 82.0 78.0 - 100.0 fL   MCH 26.2 26.0 - 34.0 pg   MCHC 32.0 30.0 - 36.0 g/dL   RDW 69.6 29.5 - 28.4 %   Platelets 326 150 - 400 K/uL  Comprehensive metabolic panel     Status: Abnormal   Collection Time: 06/12/17  1:12 PM  Result Value Ref Range   Sodium 136 135 - 145 mmol/L   Potassium 3.7 3.5 - 5.1 mmol/L   Chloride 105 101 - 111 mmol/L   CO2 25 22 - 32 mmol/L   Glucose, Bld 94 65 - 99 mg/dL   BUN 7 6 - 20 mg/dL   Creatinine, Ser 1.32 0.44 - 1.00 mg/dL   Calcium 8.7 (L) 8.9 - 10.3 mg/dL   Total Protein 7.4 6.5 - 8.1 g/dL   Albumin 3.5 3.5 - 5.0 g/dL   AST 14 (L) 15 - 41 U/L   ALT 13 (L) 14 - 54 U/L   Alkaline Phosphatase 51 38 - 126 U/L   Total Bilirubin 0.4 0.3 - 1.2 mg/dL   GFR calc non Af Amer >60 >60 mL/min   GFR calc Af Amer >60 >60 mL/min   Anion gap 6 5 - 15       IMAGING US Ob Transvaginal  Result Date: 06/12/2017 CLINICAL DATA:  Pregnant patient with cramping for a little over 1 week. EXAM: TRANSVAGINAL OB ULTRASOUND TECHNIQUE: Transvaginal ultrasound was performed for complete evaluation of the gestation as well as the maternal uterus, adnexal regions, and pelvic cul-de-sac. COMPARISON:  None. FINDINGS: Intrauterine gestational sac: Single visualized. Yolk sac:  Visualized. Embryo:  Not visualized. Cardiac Activity: Not applicable. MSD: 4.4 mm   5 w   1 d     [place] EDC: Subchorionic hemorrhage:  None visualized. Maternal  uterus/adnexae: Corpus luteum cyst right ovary noted. The ovaries are otherwise unremarkable. IMPRESSION: Findings consistent with an intrauterine pregnancy. Nonvisualization of the embryo is likely due to early gestational age. No acute abnormality. Electronically Signed   By: Drusilla Kanner M.D.   On: 06/12/2017 13:17   US Ob Less Than 14 Weeks With Ob Transvaginal  Result Date: 05/31/2017 CLINICAL DATA:  Cramping for 2 days. EXAM: OBSTETRIC <14 WK [place] AND TRANSVAGINAL OB US TECHNIQUE: Both transabdominal and transvaginal ultrasound examinations were performed for complete evaluation of the gestation as well as the maternal uterus, adnexal regions, and pelvic cul-de-sac. Transvaginal technique was performed to assess early pregnancy. COMPARISON:  None. FINDINGS: Intrauterine gestational sac: Not seen Subchorionic hemorrhage:  None visualized. Maternal uterus/adnexae: No mass or fluid identified within the endometrial canal. Small dominant follicle versus corpus luteum within the right ovary measures 1.4 cm. Bilateral ovaries otherwise unremarkable and there is no mass or free fluid seen within either adnexal region. Trace simple appearing free fluid in the cul-de-sac is likely physiologic in nature. IMPRESSION: 1. No intrauterine pregnancy identified. 2. Ovaries are unremarkable. No mass or fluid identified within either adnexal region. If beta HCG positive, early/occult ectopic pregnancy could not be excluded, and follow-up with serial beta HCG levels and pelvic ultrasound as needed would be recommended. Electronically Signed   By: Bary Richard M.D.   On: 05/31/2017 20:24    MAU Management/MDM: Ordered labs and [place] and reviewed results.  IUP noted on today's [place], c/w LMP dates with no fetal pole yet detected, wnl for early gestation.  Pt with mild anemia for first trimester so likely contributing to dizziness in addition to poor diet with low  appetite. Pt to try Reglan TID with meals and increase PO  intake of iron and protein.  Rx for PNV and Reglan sent to pharmacy.  F/U in [facility] [place] office as scheduled. Return to MAU if symptoms persist or worsen.   Treatments in MAU included Reglan 10 mg PO x 1. Pt discharged with strict dizziness/first trimester/abdominal pain in pregnancy precautions.  ASSESSMENT 1. Anemia affecting pregnancy in first trimester   2. Abdominal pain during pregnancy in first trimester   3. Normal IUP (intrauterine pregnancy) on prenatal ultrasound, first trimester   4. Nausea and vomiting during pregnancy prior to [redacted] weeks gestation     PLAN Discharge home Allergies as of 06/12/2017      Reactions   Other Anaphylaxis   "20 different types of trees"   Latex Hives, Itching   Zofran Itching      Medication List    STOP taking these medications   cyclobenzaprine 10 MG tablet Commonly known as:  FLEXERIL   fexofenadine 180 MG tablet Commonly known as:  ALLEGRA   naproxen 500 MG tablet Commonly known as:  NAPROSYN   topiramate 25 MG tablet Commonly known as:  TOPAMAX   traMADol 50 MG tablet Commonly known as:  ULTRAM     TAKE these medications   albuterol 0.63 MG/3ML nebulizer solution Commonly known as:  ACCUNEB Inhale 1 ampule by nebulization every six (6) hours as needed for wheezing.   albuterol 108 (90 Base) MCG/ACT inhaler Commonly known as:  PROVENTIL HFA;VENTOLIN HFA Inhale 2 puffs into the lungs every 6 (six) hours as needed for wheezing or shortness of breath.   clindamycin-benzoyl peroxide gel Commonly known as:  BENZACLIN WITH PUMP Apply topically 2 (two) times daily. What changed:  how much to take   fluticasone 50 MCG/ACT nasal spray Commonly known as:  FLONASE Place 1 spray into both nostrils daily.   gabapentin 300 MG capsule Commonly known as:  NEURONTIN Take 1 capsule (300 mg total) by mouth 2 (two) times daily. What changed:    when to take this  reasons to take this   metoCLOPramide 10 MG tablet Commonly known as:   REGLAN Take 1 tablet (10 mg total) by mouth 3 (three) times daily before meals.   Prenatal Vitamins 0.8 MG tablet Take 1 tablet by mouth daily.   promethazine 25 MG tablet Commonly known as:  PHENERGAN Take 1 tablet (25 mg total) by mouth every 4 (four) hours as needed for nausea or vomiting.      Follow-up Information    Brock Bad, MD Follow up.   Specialty:  Obstetrics and Gynecology Why:  As scheduled, return to MAU as needed for emergencies Contact information: [address] [place] [address] [phone]           Sharen Counter Certified Nurse-Midwife 06/12/2017  2:42 PM

## 2017-06-12 NOTE — Telephone Encounter (Signed)
[facility]  Requesting  An  eval of  EKG  And request for  Cards  Follow up . Pt  Was  Apparently discharged  After  ER  eval for Dizziness, EKG  NSR, Incomplete RBBB.  No urgent  Need, but  Ok to follow up in  6 weeks cardsz [facility]  Advised.

## 2017-06-12 NOTE — MAU Note (Signed)
Patient c/o  +lightheaded +dizziness Been going on since yesterday  +mid abdominal cramping Rating 4/10 Intermittent x1 week  Denies vaginal bleeding or discharge.

## 2017-06-13 ENCOUNTER — Encounter: Payer: Medicaid Other | Admitting: Surgery

## 2017-06-15 ENCOUNTER — Encounter: Payer: Self-pay | Admitting: General Surgery

## 2017-06-15 ENCOUNTER — Ambulatory Visit (INDEPENDENT_AMBULATORY_CARE_PROVIDER_SITE_OTHER): Payer: Medicaid Other | Admitting: General Surgery

## 2017-06-15 ENCOUNTER — Encounter: Payer: Self-pay | Admitting: Interventional Cardiology

## 2017-06-15 ENCOUNTER — Ambulatory Visit: Payer: Medicaid Other | Admitting: Interventional Cardiology

## 2017-06-15 VITALS — BP 100/78 | HR 99 | Ht 62.0 in | Wt 255.0 lb

## 2017-06-15 VITALS — BP 104/71 | HR 92 | Temp 98.0°F | Ht 62.0 in | Wt 256.8 lb

## 2017-06-15 DIAGNOSIS — Z4889 Encounter for other specified surgical aftercare: Secondary | ICD-10-CM

## 2017-06-15 DIAGNOSIS — R42 Dizziness and giddiness: Secondary | ICD-10-CM | POA: Diagnosis not present

## 2017-06-15 DIAGNOSIS — R0602 Shortness of breath: Secondary | ICD-10-CM | POA: Diagnosis not present

## 2017-06-15 NOTE — Progress Notes (Signed)
Outpatient Surgical Follow Up  06/15/2017  Laurie Reed is an 32 y.o. female.   Chief Complaint  Patient presents with  . Routine Post Op    Excision of cyst on scalp Dr.Davis 06/01/17    HPI: 32 year old female returns to clinic now 2 weeks status post excision of a sebaceous cyst from her scalp.  Patient reports she has not bathed or washed her hair since then.  She missed her first follow-up appointment today is the first time she has been seen since surgery.  She still has sutures in place.  She denies any active drainage.  She denies any fevers, chills, nausea, vomiting, chest pain, shortness of breath, diarrhea, constipation.  Past Medical History:  Diagnosis Date  . Acid reflux 2004  . Anemia   . Anxiety 2008  . Asthma 1993   no previous intubations or hospitalizations   . Chlamydia   . Depression 2008   no meds, currenly ok  . Environmental allergies   . Migraine   . Migraines   . Miscarriage   . Ovarian cyst   . Pre-diabetes   . Reflux   . Ulcer of the stomach and intestine 2004  . Urinary tract infection     Past Surgical History:  Procedure Laterality Date  . COLONOSCOPY  2004   . IRRIGATION AND DEBRIDEMENT SEBACEOUS CYST N/A 06/01/2017   Procedure: EXCISION SEBACEOUS CYST ON SCALP;  Surgeon: Ancil Linsey, MD;  Location: [facility] ORS;  Service: General;  Laterality: N/A;  . MULTIPLE TOOTH EXTRACTIONS  2015   6 teeth removed   . WISDOM TOOTH EXTRACTION  2007    Family History  Problem Relation Age of Onset  . Asthma Mother   . Heart disease Mother   . Diabetes Mother   . Hypertension Mother   . Hypertension Father   . Asthma Maternal Aunt   . Hypertension Maternal Aunt   . Diabetes Maternal Aunt   . Asthma Maternal Uncle   . Hypertension Maternal Uncle   . Diabetes Maternal Uncle   . Heart disease Maternal Grandmother   . Diabetes Maternal Grandmother   . Hypertension Maternal Grandmother   . Heart disease Maternal Grandfather   . Diabetes  Maternal Grandfather   . Hypertension Maternal Grandfather   . Hypotension Neg Hx   . Anesthesia problems Neg Hx   . Malignant hyperthermia Neg Hx   . Pseudochol deficiency Neg Hx   . Cancer Neg Hx     Social History:  reports that  has never smoked. she has never used smokeless tobacco. She reports that she does not drink alcohol or use drugs.  Allergies:  Allergies  Allergen Reactions  . Other Anaphylaxis    "20 different types of trees"  . Latex Hives and Itching  . Zofran Itching    Medications reviewed.    ROS A multipoint review of systems was completed, all pertinent positives and negatives are documented within the HPI and the remainder are negative   BP 104/71   Pulse 92   Temp 98 F (36.7 C) (Oral)   Ht 5\' 2"  (1.575 m)   Wt 116.5 kg (256 lb 12.8 oz)   LMP 05/01/2017 (Exact Date)   BMI 46.97 kg/m   Physical Exam General: No acute distress Chest: Clear to auscultation Heart: Regular rate and rhythm Abdomen: Soft nontender Skin: Scalp area of sebaceous cyst with thick scab encasing the sutures.  Difficult to ascertain suture versus hair.  No evidence of infection or  purulence.    No results found for this or any previous visit (from the past 48 hour(s)). No results found.  Assessment/Plan:  1. Aftercare following surgery 32 year old female status post sebaceous cyst excision of the superior scalp.  Sutures removed today without difficulty.  Discussed wound care and hair washing with the patient.  Given the difficulty in ascertaining sutures discussed that she would have should follow-up with her operative surgeon next week for an additional wound check.     Ricarda Frameharles Jari Carollo, MD FACS General Surgeon  06/15/2017,2:48 PM

## 2017-06-15 NOTE — Patient Instructions (Signed)
You may wash your hair today.  Please see your follow up appointment listed below.

## 2017-06-15 NOTE — Progress Notes (Signed)
Cardiology Office Note   Date:  06/15/2017   ID:  Laurie Reed, DOB 1985-06-01, MRN [id number]  PCP:  Marcine Matar, MD    No chief complaint on file. Dizziness   Wt Readings from Last 3 Encounters:  06/15/17 255 lb (115.7 kg)  06/12/17 252 lb 1.9 oz (114.4 kg)  05/25/17 254 lb (115.2 kg)       History of Present Illness: Laurie Reed is a 32 y.o. female  Who is [redacted] weeks pregnant, who presents to maternity admissions reporting increased abdominal cramping and intermittent dizziness x 1 week.    In the [facility] hospital ER, it was noted that: Her pain is low in her abdomen, intermittent cramping pain starting 1 week ago. It is the same since onset 1 week ago.  The dizziness started at about the same time and was intermittent, when standing up too quickly but is now more constant. She feels lightheaded and dizzy, mostly when standing and it does improve when sitting down but it is now occurring throughout the day, not just occasionally.  She is not eating as much as usual, she reports nausea and lack of appetite keeping her from eating much.  She is drinking water but reports it is probably not enough.   ECG showed normal sinus rhythm with IRBBB pattern.  THe rSR' pattern was present on an ECG from 2015 as well. The only change was that her HR had increased.  She reports some DOE as well.   Since the ER visit, she feels tired.  Still having trouble keeping food down.  She is drinking more water.  Dizziness is better.  She has asthma and this has affected her breathing.     Past Medical History:  Diagnosis Date  . Acid reflux 2004  . Anemia   . Anxiety 2008  . Asthma 1993   no previous intubations or hospitalizations   . Chlamydia   . Depression 2008   no meds, currenly ok  . Environmental allergies   . Migraine   . Migraines   . Miscarriage   . Ovarian cyst   . Pre-diabetes   . Reflux   . Ulcer of the stomach and intestine 2004  . Urinary tract  infection     Past Surgical History:  Procedure Laterality Date  . COLONOSCOPY  2004   . IRRIGATION AND DEBRIDEMENT SEBACEOUS CYST N/A 06/01/2017   Procedure: EXCISION SEBACEOUS CYST ON SCALP;  Surgeon: Ancil Linsey, MD;  Location: [facility] ORS;  Service: General;  Laterality: N/A;  . MULTIPLE TOOTH EXTRACTIONS  2015   6 teeth removed   . WISDOM TOOTH EXTRACTION  2007     Current Outpatient Medications  Medication Sig Dispense Refill  . albuterol (ACCUNEB) 0.63 MG/3ML nebulizer solution Inhale 1 ampule by nebulization every six (6) hours as needed for wheezing. 75 mL 5  . albuterol (PROVENTIL HFA;VENTOLIN HFA) 108 (90 Base) MCG/ACT inhaler Inhale 2 puffs into the lungs every 6 (six) hours as needed for wheezing or shortness of breath. 1 Inhaler 1  . clindamycin-benzoyl peroxide (BENZACLIN WITH PUMP) gel Apply topically 2 (two) times daily. (Patient taking differently: Apply 1 application topically 2 (two) times daily. ) 50 g 1  . metoCLOPramide (REGLAN) 10 MG tablet Take 1 tablet (10 mg total) by mouth 3 (three) times daily before meals. 60 tablet 5  . Prenatal Multivit-Min-Fe-FA (PRENATAL VITAMINS) 0.8 MG tablet Take 1 tablet by mouth daily. 30 tablet 12  . promethazine (PHENERGAN)  25 MG tablet Take 1 tablet (25 mg total) by mouth every 4 (four) hours as needed for nausea or vomiting. 12 tablet 1   No current facility-administered medications for this visit.     Allergies:   Other; Latex; and Zofran    Social History:  The patient  reports that  has never smoked. she has never used smokeless tobacco. She reports that she does not drink alcohol or use drugs.   Family History:  The patient's family history includes Asthma in her maternal aunt, maternal uncle, and mother; Diabetes in her maternal aunt, maternal grandfather, maternal grandmother, maternal uncle, and mother; Heart disease in her maternal grandfather, maternal grandmother, and mother; Hypertension in her father, maternal  aunt, maternal grandfather, maternal grandmother, maternal uncle, and mother. Mother's brother had CHF at a young age.   ROS:  Please see the history of present illness.   Otherwise, review of systems are positive for dizziness.   All other systems are reviewed and negative.    PHYSICAL EXAM: VS:  BP 100/78 (BP Location: Right Arm, Patient Position: Sitting, Cuff Size: Large)   Pulse 99   Ht 5\' 2"  (1.575 m)   Wt 255 lb (115.7 kg)   LMP 05/01/2017 (Exact Date)   SpO2 99%   BMI 46.64 kg/m  , BMI Body mass index is 46.64 kg/m. GEN: Well nourished, well developed, in no acute distress  HEENT: normal  Neck: no JVD, carotid bruits, or masses Cardiac: RRR; no murmurs, rubs, or gallops,no edema  Respiratory:  clear to auscultation bilaterally, normal work of breathing GI: soft, nontender, nondistended, + BS MS: no deformity or atrophy  Skin: warm and dry, no rash Neuro:  Strength and sensation are intact Psych: euthymic mood, full affect   EKG:   The ekg ordered in Jun 12, 2017 demonstrates NSR, IRBBB, no ST changes   Recent Labs: 06/12/2017: ALT 13; BUN 7; Creatinine, Ser 0.85; Hemoglobin 10.9; Platelets 326; Potassium 3.7; Sodium 136   Lipid Panel No results found for: CHOL, TRIG, HDL, CHOLHDL, VLDL, LDLCALC, LDLDIRECT   Other studies Reviewed: Additional studies/ records that were reviewed today with results demonstrating: Labs showed potassium 3.7 .   ASSESSMENT AND PLAN:  1. DOE/SHOB: Worse with walking.  Given family h/o CHF, will plan for echo to eval for structural heart disease. 2. Dizziness: stay hydrated.  Sx have improved.  No evidence of any rhythm abnormality on ECG. 3. Routine prenatal care with OB/GYN.   Current medicines are reviewed at length with the patient today.  The patient concerns regarding her medicines were addressed.  The following changes have been made:  No change  Labs/ tests ordered today include:  No orders of the defined types were placed  in this encounter.   Recommend 150 minutes/week of aerobic exercise Low fat, low carb, high fiber diet recommended  Disposition:   FU in prn   Signed, Lance MussJayadeep Kharson Rasmusson, MD  06/15/2017 10:24 AM    [facility] [address], [place], [place]  [address] Phone: [phone]; Fax: [phone]

## 2017-06-15 NOTE — Patient Instructions (Signed)
Medication Instructions:  Your physician recommends that you continue on your current medications as directed. Please refer to the Current Medication list given to you today.   Labwork: None ordered  Testing/Procedures: Your physician has requested that you have an echocardiogram. Echocardiography is a painless test that uses sound waves to create images of your heart. It provides your doctor with information about the size and shape of your heart and how well your heart's chambers and valves are working. This procedure takes approximately one hour. There are no restrictions for this procedure.  Follow-Up: Your physician wants you to follow-up AS NEEDED   Any Other Special Instructions Will Be Listed Below (If Applicable).  Echocardiogram An echocardiogram, or echocardiography, uses sound waves (ultrasound) to produce an image of your heart. The echocardiogram is simple, painless, obtained within a short period of time, and offers valuable information to your health care provider. The images from an echocardiogram can provide information such as:  Evidence of coronary artery disease (CAD).  Heart size.  Heart muscle function.  Heart valve function.  Aneurysm detection.  Evidence of a past heart attack.  Fluid buildup around the heart.  Heart muscle thickening.  Assess heart valve function.  Tell a health care provider about:  Any allergies you have.  All medicines you are taking, including vitamins, herbs, eye drops, creams, and over-the-counter medicines.  Any problems you or family members have had with anesthetic medicines.  Any blood disorders you have.  Any surgeries you have had.  Any medical conditions you have.  Whether you are pregnant or may be pregnant. What happens before the procedure? No special preparation is needed. Eat and drink normally. What happens during the procedure?  In order to produce an image of your heart, gel will be applied to your  chest and a wand-like tool (transducer) will be moved over your chest. The gel will help transmit the sound waves from the transducer. The sound waves will harmlessly bounce off your heart to allow the heart images to be captured in real-time motion. These images will then be recorded.  You may need an IV to receive a medicine that improves the quality of the pictures. What happens after the procedure? You may return to your normal schedule including diet, activities, and medicines, unless your health care provider tells you otherwise. This information is not intended to replace advice given to you by your health care provider. Make sure you discuss any questions you have with your health care provider. Document Released: 04/30/2000 Document Revised: 12/20/2015 Document Reviewed: 01/08/2013 Elsevier Interactive Patient Education  2017 Elsevier Inc.    If you need a refill on your cardiac medications before your next appointment, please call your pharmacy.   

## 2017-06-20 ENCOUNTER — Inpatient Hospital Stay (HOSPITAL_COMMUNITY)
Admission: AD | Admit: 2017-06-20 | Discharge: 2017-06-20 | Disposition: A | Discharge status: Home / Self Care | Payer: Medicaid Other | Source: Ambulatory Visit | Attending: Obstetrics and Gynecology | Admitting: Obstetrics and Gynecology

## 2017-06-20 ENCOUNTER — Encounter (HOSPITAL_COMMUNITY): Payer: Self-pay | Admitting: *Deleted

## 2017-06-20 ENCOUNTER — Inpatient Hospital Stay (HOSPITAL_COMMUNITY): Payer: Medicaid Other

## 2017-06-20 DIAGNOSIS — Z3A01 Less than 8 weeks gestation of pregnancy: Secondary | ICD-10-CM | POA: Diagnosis not present

## 2017-06-20 DIAGNOSIS — O209 Hemorrhage in early pregnancy, unspecified: Secondary | ICD-10-CM | POA: Diagnosis present

## 2017-06-20 DIAGNOSIS — Z3491 Encounter for supervision of normal pregnancy, unspecified, first trimester: Secondary | ICD-10-CM

## 2017-06-20 LAB — URINALYSIS, ROUTINE W REFLEX MICROSCOPIC
BACTERIA UA: NONE SEEN
Bilirubin Urine: NEGATIVE
Glucose, UA: 50 mg/dL — AB
Ketones, ur: NEGATIVE mg/dL
Leukocytes, UA: NEGATIVE
NITRITE: NEGATIVE
PROTEIN: NEGATIVE mg/dL
Specific Gravity, Urine: 1.017 (ref 1.005–1.030)
WBC UA: NONE SEEN WBC/hpf (ref 0–5)
pH: 6 (ref 5.0–8.0)

## 2017-06-20 NOTE — MAU Provider Note (Signed)
History     CSN: [id number]  Arrival date and time: 06/20/17 4098   First Provider Initiated Contact with Patient 06/20/17 [id number]      Chief Complaint  Patient presents with  . Abdominal Pain  . Vaginal Bleeding   HPI Laurie Reed is a 32 y.o. [id number] at [redacted]w[redacted]d who presents with vaginal bleeding. Reports pink spotting on toilet paper since yesterday. Has not had to wear pads and not passing clots. Endorses intermittent lower abdominal cramping as well. Denies pain at this time. Has history of SAB and is very concerned. Denies n/v/d, dysuria, or vaginal discharge. Last intercourse on Saturday.  OB History    Gravida Para Term Preterm AB Living   6 3 3  0 2 3   SAB TAB Ectopic Multiple Live Births   2 0 0 0 3      Past Medical History:  Diagnosis Date  . Acid reflux 2004  . Anemia   . Anxiety 2008  . Asthma 1993   no previous intubations or hospitalizations   . Chlamydia   . Depression 2008   no meds, currenly ok  . Environmental allergies   . Migraine   . Migraines   . Miscarriage   . Ovarian cyst   . Pre-diabetes   . Reflux   . Ulcer of the stomach and intestine 2004  . Urinary tract infection     Past Surgical History:  Procedure Laterality Date  . COLONOSCOPY  2004   . IRRIGATION AND DEBRIDEMENT SEBACEOUS CYST N/A 06/01/2017   Procedure: EXCISION SEBACEOUS CYST ON SCALP;  Surgeon: Ancil Linsey, MD;  Location: [facility] ORS;  Service: General;  Laterality: N/A;  . MULTIPLE TOOTH EXTRACTIONS  2015   6 teeth removed   . WISDOM TOOTH EXTRACTION  2007    Family History  Problem Relation Age of Onset  . Asthma Mother   . Heart disease Mother   . Diabetes Mother   . Hypertension Mother   . Hypertension Father   . Asthma Maternal Aunt   . Hypertension Maternal Aunt   . Diabetes Maternal Aunt   . Asthma Maternal Uncle   . Hypertension Maternal Uncle   . Diabetes Maternal Uncle   . Heart disease Maternal Grandmother   . Diabetes Maternal Grandmother   .  Hypertension Maternal Grandmother   . Heart disease Maternal Grandfather   . Diabetes Maternal Grandfather   . Hypertension Maternal Grandfather   . Hypotension Neg Hx   . Anesthesia problems Neg Hx   . Malignant hyperthermia Neg Hx   . Pseudochol deficiency Neg Hx   . Cancer Neg Hx     Social History   Tobacco Use  . Smoking status: Never Smoker  . Smokeless tobacco: Never Used  Substance Use Topics  . Alcohol use: No    Alcohol/week: 0.0 oz  . Drug use: No    Allergies:  Allergies  Allergen Reactions  . Other Anaphylaxis    "20 different types of trees"  . Latex Hives and Itching  . Zofran Itching    Medications Prior to Admission  Medication Sig Dispense Refill Last Dose  . albuterol (ACCUNEB) 0.63 MG/3ML nebulizer solution Inhale 1 ampule by nebulization every six (6) hours as needed for wheezing. 75 mL 5 Taking  . albuterol (PROVENTIL HFA;VENTOLIN HFA) 108 (90 Base) MCG/ACT inhaler Inhale 2 puffs into the lungs every 6 (six) hours as needed for wheezing or shortness of breath. 1 Inhaler 1 Taking  . clindamycin-benzoyl peroxide (  BENZACLIN WITH PUMP) gel Apply topically 2 (two) times daily. (Patient taking differently: Apply 1 application topically 2 (two) times daily. ) 50 g 1 Taking  . metoCLOPramide (REGLAN) 10 MG tablet Take 1 tablet (10 mg total) by mouth 3 (three) times daily before meals. 60 tablet 5 Taking  . Prenatal Multivit-Min-Fe-FA (PRENATAL VITAMINS) 0.8 MG tablet Take 1 tablet by mouth daily. 30 tablet 12 Taking  . promethazine (PHENERGAN) 25 MG tablet Take 1 tablet (25 mg total) by mouth every 4 (four) hours as needed for nausea or vomiting. 12 tablet 1 Taking    Review of Systems  Constitutional: Negative.   Gastrointestinal: Positive for abdominal pain (none currently). Negative for diarrhea, nausea and vomiting.  Genitourinary: Positive for vaginal bleeding. Negative for dyspareunia, dysuria and vaginal discharge.   Physical Exam   Blood pressure  132/73, pulse 100, temperature 98.8 F (37.1 C), temperature source Oral, resp. rate 18, weight 256 lb 1.9 oz (116.2 kg), last menstrual period 05/01/2017, SpO2 100 %.  Physical Exam  Nursing note and vitals reviewed. Constitutional: She is oriented to person, place, and time. She appears well-developed and well-nourished. No distress.  HENT:  Head: Normocephalic and atraumatic.  Eyes: Conjunctivae are normal. Right eye exhibits no discharge. Left eye exhibits no discharge. No scleral icterus.  Neck: Normal range of motion.  Respiratory: Effort normal. No respiratory distress.  GI: Soft. There is no tenderness.  Genitourinary: No bleeding in the vagina.  Neurological: She is alert and oriented to person, place, and time.  Skin: Skin is warm and dry. She is not diaphoretic.  Psychiatric: She has a normal mood and affect. Her behavior is normal. Judgment and thought content normal.    MAU Course  Procedures Results for orders placed or performed during the hospital encounter of 06/20/17 (from the past 48 hour(s))  Urinalysis, Routine w reflex microscopic     Status: Abnormal   Collection Time: 06/20/17  8:17 AM  Result Value Ref Range   Color, Urine YELLOW YELLOW   APPearance CLEAR CLEAR   Specific Gravity, Urine 1.017 1.005 - 1.030   pH 6.0 5.0 - 8.0   Glucose, UA 50 (A) NEGATIVE mg/dL   Hgb urine dipstick SMALL (A) NEGATIVE   Bilirubin Urine NEGATIVE NEGATIVE   Ketones, ur NEGATIVE NEGATIVE mg/dL   Protein, ur NEGATIVE NEGATIVE mg/dL   Nitrite NEGATIVE NEGATIVE   Leukocytes, UA NEGATIVE NEGATIVE   RBC / HPF 0-5 0 - 5 RBC/hpf   WBC, UA NONE SEEN 0 - 5 WBC/hpf   Bacteria, UA NONE SEEN NONE SEEN   Squamous Epithelial / LPF 0-5 (A) NONE SEEN   Mucus PRESENT     Comment: Performed at [facility], [address] [place], [place] [address]   [place] Ob Transvaginal  Result Date: 06/20/2017 CLINICAL DATA:  Intermittent abdominal cramping. Vaginal bleeding in first-trimester  pregnancy. EXAM: TRANSVAGINAL OB ULTRASOUND TECHNIQUE: Transvaginal ultrasound was performed for complete evaluation of the gestation as well as the maternal uterus, adnexal regions, and pelvic cul-de-sac. COMPARISON:  06/12/2017 FINDINGS: Intrauterine gestational sac: Single Yolk sac:  Visualized. Embryo:  Visualized. Cardiac Activity: Visualized. Heart Rate: 120 bpm CRL:   4 mm   6 w 0 d                  US EDC: 02/13/2018 Subchorionic hemorrhage:  None visualized. Maternal uterus/adnexae: Normal appearance IMPRESSION: Progressing intrauterine pregnancy measuring 6 weeks, an embryo with heartbeat is now visualized. No adverse findings. Electronically Signed  By: Marnee Spring M.D.   On: 06/20/2017 09:49     MDM A positive Ultrasound shows SIUP with cardiac activity  Patient reassured. Spotting likely post coital.   Assessment and Plan  A; 1. Normal IUP (intrauterine pregnancy) on prenatal ultrasound, first trimester   2. Vaginal bleeding in pregnancy, first trimester    P: Discharge home Pelvic rest Start prenatal care Discussed reasons to return to MAU   Judeth Horn 06/20/2017, 8:41 AM

## 2017-06-20 NOTE — MAU Note (Addendum)
Patient c/o  +lower abdominal cramping Intermittent None at present time  +vaginal bleeding None currently Noticed first last night Notices when wipes Light pink in color  Last intercourse saturday

## 2017-06-20 NOTE — Discharge Instructions (Signed)

## 2017-06-22 ENCOUNTER — Encounter: Payer: Self-pay | Admitting: Surgery

## 2017-06-22 ENCOUNTER — Ambulatory Visit (INDEPENDENT_AMBULATORY_CARE_PROVIDER_SITE_OTHER): Payer: Medicaid Other | Admitting: Surgery

## 2017-06-22 VITALS — BP 135/80 | HR 88 | Temp 98.4°F | Ht 62.0 in | Wt 254.0 lb

## 2017-06-22 DIAGNOSIS — L989 Disorder of the skin and subcutaneous tissue, unspecified: Secondary | ICD-10-CM

## 2017-06-22 DIAGNOSIS — L723 Sebaceous cyst: Secondary | ICD-10-CM

## 2017-06-22 NOTE — Patient Instructions (Signed)
Congratulations on your pregnancy!  Please give [place] a call once you give birth to your baby so we could excise the other cysts.

## 2017-06-22 NOTE — Progress Notes (Signed)
Surgical Clinic Progress/Follow-up Note   HPI:  32 y.o. Female presents to clinic for post-op follow-up evaluation 3 weeks s/p excision of patient's largest and most painful of 3 scalp sebaceous cysts with removal of sutures last week. Patient reports complete resolution of her both pre- and post-operative pain associated with the lesion and even had her hair re-braided. She otherwise denies any drainage, bleeding, fever/chills, N/V, CP, or SOB. She is currently attending ob/gyn appointments and preparing for her upcoming child as she is newly pregnant.  Review of Systems:  Constitutional: denies any other weight loss, fever, chills, or sweats  Eyes: denies any other vision changes, history of eye injury  ENT: denies sore throat, hearing problems  Respiratory: denies shortness of breath, wheezing  Cardiovascular: denies chest pain, palpitations  Gastrointestinal: denies abdominal pain, N/V, or diarrhea Musculoskeletal: denies any other joint pains or cramps  Skin: Denies any other rashes or skin discolorations except as per HPI Neurological: denies any other headache, dizziness, weakness  Psychiatric: denies any other depression, anxiety  All other review of systems: otherwise negative   Vital Signs:  BP 135/80   Pulse 88   Temp 98.4 F (36.9 C) (Oral)   Ht 5\' 2"  (1.575 m)   Wt 254 lb (115.2 kg)   LMP 05/01/2017 (Exact Date)   BMI 46.46 kg/m    Physical Exam:  Constitutional:  -- Obese body habitus  -- Awake, alert, and oriented x3  Eyes:  -- Pupils equally round and reactive to light  -- No scleral icterus  Ear, nose, throat:  -- No jugular venous distension  -- No nasal drainage, bleeding Pulmonary:  -- No crackles -- Equal breath sounds bilaterally -- Breathing non-labored at rest Cardiovascular:  -- S1, S2 present  -- No pericardial rubs  Gastrointestinal:  -- Soft, nontender, non-distended, no guarding/rebound  -- No abdominal masses appreciated, pulsatile or  otherwise  Musculoskeletal / Integumentary:  -- Wounds or skin discoloration: scalp wound completely non-tender and well-approximated without any surrounding erythema, drainage, or remaining sutures following removal last week  -- Extremities: B/L UE and LE FROM, hands and feet warm  Neurologic:  -- Motor function: intact and symmetric  -- Sensation: intact and symmetric   Assessment:  32 y.o. yo Female with a problem list including...  Patient Active Problem List   Diagnosis Date Noted  . Inflamed sebaceous cyst   . Diabetes mellitus screening 03/02/2017  . Skin pustule 12/02/2016  . Vagina itching 12/02/2016  . Nasal turbinate hypertrophy 11/01/2016  . Chronic nonintractable headache 11/01/2016  . Seasonal allergic rhinitis 11/01/2016  . Cervical myelopathy (HCC) 07/01/2016  . Foot swelling 02/18/2016  . Bilateral hand pain 10/30/2015  . Right carpal tunnel syndrome 10/30/2015  . Polymyalgia (HCC) 10/30/2015  . Polyarthralgia 10/30/2015  . Astigmatism 09/18/2015  . Asthma 06/30/2015  . Scalp lesion 06/30/2015  . Vitamin D deficiency 01/22/2015  . Severe obesity (BMI >= 40) (HCC) 01/21/2015  . Foot pain, bilateral 01/21/2015  . Fatigue 01/21/2015  . Sacroiliac joint dysfunction of left side 01/21/2015  . Tinea capitis 01/21/2015  . Anxiety and depression 01/21/2015  . Patellofemoral arthralgia of right knee 01/08/2015  . Ligamentous laxity of right ankle 01/08/2015  . Right knee pain 01/11/2014  . Headache(784.0) 01/11/2014    presents to clinic for post-op follow-up evaluation, doing well s/p excision of largest and most painful of 3 scalp sebaceous cysts in the context or recent pregnancy diagnosis the evening prior to her surgery.  Plan:   -  may shower, bathe, and perform activities without restrictions  - return to clinic after childbirth if would like remaining scalp lesions excised with anesthesia  - instructed to call office if any questions or concerns  All of  the above recommendations were discussed with the patient, and all of patient's questions were answered to her expressed satisfaction.  -- Scherrie GerlachJason E. Earlene Plateravis, MD, RPVI [place]: [facility] - Partnering for exceptional care. Office: [phone]

## 2017-06-25 ENCOUNTER — Inpatient Hospital Stay (HOSPITAL_COMMUNITY): Payer: BLUE CROSS/BLUE SHIELD

## 2017-06-25 ENCOUNTER — Encounter (HOSPITAL_COMMUNITY): Payer: Self-pay | Admitting: Student

## 2017-06-25 ENCOUNTER — Other Ambulatory Visit: Payer: Self-pay

## 2017-06-25 ENCOUNTER — Inpatient Hospital Stay (HOSPITAL_COMMUNITY)
Admission: AD | Admit: 2017-06-25 | Discharge: 2017-06-25 | Disposition: A | Discharge status: Home / Self Care | Payer: BLUE CROSS/BLUE SHIELD | Source: Ambulatory Visit | Attending: Obstetrics and Gynecology | Admitting: Obstetrics and Gynecology

## 2017-06-25 DIAGNOSIS — F329 Major depressive disorder, single episode, unspecified: Secondary | ICD-10-CM | POA: Insufficient documentation

## 2017-06-25 DIAGNOSIS — O99611 Diseases of the digestive system complicating pregnancy, first trimester: Secondary | ICD-10-CM | POA: Diagnosis not present

## 2017-06-25 DIAGNOSIS — N930 Postcoital and contact bleeding: Secondary | ICD-10-CM

## 2017-06-25 DIAGNOSIS — Z79899 Other long term (current) drug therapy: Secondary | ICD-10-CM | POA: Diagnosis not present

## 2017-06-25 DIAGNOSIS — O99341 Other mental disorders complicating pregnancy, first trimester: Secondary | ICD-10-CM | POA: Diagnosis not present

## 2017-06-25 DIAGNOSIS — J45909 Unspecified asthma, uncomplicated: Secondary | ICD-10-CM | POA: Insufficient documentation

## 2017-06-25 DIAGNOSIS — Z3A01 Less than 8 weeks gestation of pregnancy: Secondary | ICD-10-CM | POA: Insufficient documentation

## 2017-06-25 DIAGNOSIS — K219 Gastro-esophageal reflux disease without esophagitis: Secondary | ICD-10-CM | POA: Insufficient documentation

## 2017-06-25 DIAGNOSIS — F419 Anxiety disorder, unspecified: Secondary | ICD-10-CM | POA: Insufficient documentation

## 2017-06-25 DIAGNOSIS — O99511 Diseases of the respiratory system complicating pregnancy, first trimester: Secondary | ICD-10-CM | POA: Diagnosis not present

## 2017-06-25 DIAGNOSIS — O209 Hemorrhage in early pregnancy, unspecified: Secondary | ICD-10-CM | POA: Insufficient documentation

## 2017-06-25 DIAGNOSIS — Z3491 Encounter for supervision of normal pregnancy, unspecified, first trimester: Secondary | ICD-10-CM

## 2017-06-25 DIAGNOSIS — Z3A08 8 weeks gestation of pregnancy: Secondary | ICD-10-CM

## 2017-06-25 LAB — URINALYSIS, ROUTINE W REFLEX MICROSCOPIC
BILIRUBIN URINE: NEGATIVE
GLUCOSE, UA: 50 mg/dL — AB
Ketones, ur: NEGATIVE mg/dL
LEUKOCYTES UA: NEGATIVE
NITRITE: NEGATIVE
PH: 6 (ref 5.0–8.0)
Protein, ur: NEGATIVE mg/dL
SPECIFIC GRAVITY, URINE: 1.013 (ref 1.005–1.030)

## 2017-06-25 NOTE — MAU Note (Signed)
Some back pain. Stated having pink d/c this am. Have seen alittle more bleeding as day has gone by. Tonight saw bright blood in toilet but not alot

## 2017-06-25 NOTE — MAU Note (Signed)
Judeth HornERin Lawrence NP at bedside with portable u/s

## 2017-06-25 NOTE — MAU Provider Note (Signed)
History     CSN: [id number]  Arrival date and time: 06/25/17 2105   First Provider Initiated Contact with Patient 06/25/17 2149      Chief Complaint  Patient presents with  . Back Pain  . Vaginal Bleeding   HPI  Laurie Reed is a 32 y.o. [id number] at [redacted]w[redacted]d who presents with vaginal bleeding & low back pain. Was seen in MAU 5 days ago with same complaint & diagnosed with post coital bleeding. Ultrasound on 2/4 confirmed viable IUP.  Reports current symptoms began this morning after intercourse. Initially noted pink spotting that has become bright red & passed small pea sized clot this evening. Denies abdominal pain. Is not having to wear a pad. Reports constant low back pain. This was an issue prior to pregnancy. Has been taking tylenol with minimal relief. Rates pain 5/10. Nothing makes better or worse. Denies dysuria.   OB History    Gravida Para Term Preterm AB Living   6 3 3  0 2 3   SAB TAB Ectopic Multiple Live Births   2 0 0 0 3      Past Medical History:  Diagnosis Date  . Acid reflux 2004  . Anemia   . Anxiety 2008  . Asthma 1993   no previous intubations or hospitalizations   . Chlamydia   . Depression 2008   no meds, currenly ok  . Environmental allergies   . Migraines   . Miscarriage   . Ovarian cyst   . Pre-diabetes   . Ulcer of the stomach and intestine 2004  . Urinary tract infection     Past Surgical History:  Procedure Laterality Date  . COLONOSCOPY  2004   . IRRIGATION AND DEBRIDEMENT SEBACEOUS CYST N/A 06/01/2017   Procedure: EXCISION SEBACEOUS CYST ON SCALP;  Surgeon: Ancil Linsey, MD;  Location: [facility] ORS;  Service: General;  Laterality: N/A;  . MULTIPLE TOOTH EXTRACTIONS  2015   6 teeth removed   . WISDOM TOOTH EXTRACTION  2007    Family History  Problem Relation Age of Onset  . Asthma Mother   . Heart disease Mother   . Diabetes Mother   . Hypertension Mother   . Hypertension Father   . Asthma Maternal Aunt   . Hypertension  Maternal Aunt   . Diabetes Maternal Aunt   . Asthma Maternal Uncle   . Hypertension Maternal Uncle   . Diabetes Maternal Uncle   . Heart disease Maternal Grandmother   . Diabetes Maternal Grandmother   . Hypertension Maternal Grandmother   . Heart disease Maternal Grandfather   . Diabetes Maternal Grandfather   . Hypertension Maternal Grandfather   . Hypotension Neg Hx   . Anesthesia problems Neg Hx   . Malignant hyperthermia Neg Hx   . Pseudochol deficiency Neg Hx   . Cancer Neg Hx     Social History   Tobacco Use  . Smoking status: Never Smoker  . Smokeless tobacco: Never Used  Substance Use Topics  . Alcohol use: No    Alcohol/week: 0.0 oz  . Drug use: No    Allergies:  Allergies  Allergen Reactions  . Other Anaphylaxis    "20 different types of trees"  . Latex Hives and Itching  . Zofran Itching    Medications Prior to Admission  Medication Sig Dispense Refill Last Dose  . acetaminophen (TYLENOL) 500 MG tablet Take 1,000 mg by mouth every 6 (six) hours as needed.   06/25/2017 at Unknown time  .  metoCLOPramide (REGLAN) 10 MG tablet Take 1 tablet (10 mg total) by mouth 3 (three) times daily before meals. 60 tablet 5 Past Week at Unknown time  . Prenatal Multivit-Min-Fe-FA (PRENATAL VITAMINS) 0.8 MG tablet Take 1 tablet by mouth daily. 30 tablet 12 06/25/2017 at Unknown time  . promethazine (PHENERGAN) 25 MG tablet Take 1 tablet (25 mg total) by mouth every 4 (four) hours as needed for nausea or vomiting. 12 tablet 1 Past Week at Unknown time  . albuterol (ACCUNEB) 0.63 MG/3ML nebulizer solution Inhale 1 ampule by nebulization every six (6) hours as needed for wheezing. 75 mL 5 Taking  . albuterol (PROVENTIL HFA;VENTOLIN HFA) 108 (90 Base) MCG/ACT inhaler Inhale 2 puffs into the lungs every 6 (six) hours as needed for wheezing or shortness of breath. 1 Inhaler 1 Taking  . clindamycin-benzoyl peroxide (BENZACLIN WITH PUMP) gel Apply topically 2 (two) times daily. (Patient  taking differently: Apply 1 application topically 2 (two) times daily. ) 50 g 1 Taking    Review of Systems  Gastrointestinal: Negative for abdominal pain.  Genitourinary: Positive for vaginal bleeding. Negative for dyspareunia, dysuria, flank pain, hematuria and vaginal discharge.  Musculoskeletal: Positive for back pain.   Physical Exam   Blood pressure 124/75, pulse 94, temperature 98.6 F (37 C), temperature source Oral, resp. rate 18, height 5\' 2"  (1.575 m), weight 259 lb (117.5 kg), last menstrual period 05/01/2017.  Physical Exam  Nursing note and vitals reviewed. Constitutional: She is oriented to person, place, and time. She appears well-developed and well-nourished. No distress.  HENT:  Head: Normocephalic and atraumatic.  Eyes: Conjunctivae are normal. Right eye exhibits no discharge. Left eye exhibits no discharge. No scleral icterus.  Neck: Normal range of motion.  Respiratory: Effort normal. No respiratory distress.  GI: Soft. There is no tenderness.  Genitourinary: There is bleeding (minimal amount of bright red mucoid blood. Cervix closed. ) in the vagina.  Neurological: She is alert and oriented to person, place, and time.  Skin: Skin is warm and dry. She is not diaphoretic.  Psychiatric: She has a normal mood and affect. Her behavior is normal. Judgment and thought content normal.    MAU Course  Procedures Results for orders placed or performed during the hospital encounter of 06/25/17 (from the past 24 hour(s))  Urinalysis, Routine w reflex microscopic     Status: Abnormal   Collection Time: 06/25/17  9:36 PM  Result Value Ref Range   Color, Urine YELLOW YELLOW   APPearance CLEAR CLEAR   Specific Gravity, Urine 1.013 1.005 - 1.030   pH 6.0 5.0 - 8.0   Glucose, UA 50 (A) NEGATIVE mg/dL   Hgb urine dipstick LARGE (A) NEGATIVE   Bilirubin Urine NEGATIVE NEGATIVE   Ketones, ur NEGATIVE NEGATIVE mg/dL   Protein, ur NEGATIVE NEGATIVE mg/dL   Nitrite NEGATIVE  NEGATIVE   Leukocytes, UA NEGATIVE NEGATIVE   RBC / HPF 6-30 0 - 5 RBC/hpf   WBC, UA 0-5 0 - 5 WBC/hpf   Bacteria, UA RARE (A) NONE SEEN   Squamous Epithelial / LPF 0-5 (A) NONE SEEN   Mucus PRESENT    [place] Ob Transvaginal  Result Date: 06/25/2017 CLINICAL DATA:  Bleeding.  Back pain.  Pregnant patient. EXAM: OBSTETRIC <14 WK US AND TRANSVAGINAL OB US TECHNIQUE: Both transabdominal and transvaginal ultrasound examinations were performed for complete evaluation of the gestation as well as the maternal uterus, adnexal regions, and pelvic cul-de-sac. Transvaginal technique was performed to assess early pregnancy. COMPARISON:  06/20/2017 FINDINGS: Intrauterine gestational sac: Single Yolk sac:  Visualized. Embryo:  Visualized. Cardiac Activity: Visualized. Heart Rate: 140 bpm CRL:  9 mm   6 w   6 d                  [place] EDC: 02/12/2018 Subchorionic hemorrhage:  None visualized. Maternal uterus/adnexae: No uterine masses. Cervix is closed. Corpus luteum arises from the right ovary. Ovaries otherwise unremarkable. No adnexal masses. No pelvic free fluid. IMPRESSION: 1. Single live intrauterine pregnancy with a measured gestational age of [redacted] weeks and 6 days, showing normal progression when compared to the recent prior ultrasound. 2. No evidence of a pregnancy complication. No emergent maternal abnormality. Electronically Signed   By: Amie Portland M.D.   On: 06/25/2017 23:11    MDM A positive Minimal bleeding on exam & cervix closed Negative GC/CT & wet prep on 1/15 -- pt declines swabs today Ultrasound shows SIUP with cardiac activity Assessment and Plan  A; 1. Postcoital bleeding   2. Vaginal bleeding in pregnancy, first trimester   3. Normal IUP (intrauterine pregnancy) on prenatal ultrasound, first trimester   4. [redacted] weeks gestation of pregnancy    P: Discharge home Pelvic rest Start prenatal care Discussed reasons to return to MAU  Judeth Horn 06/25/2017, 9:49 PM

## 2017-06-25 NOTE — Discharge Instructions (Signed)

## 2017-06-26 ENCOUNTER — Encounter (HOSPITAL_COMMUNITY): Payer: Self-pay

## 2017-06-26 ENCOUNTER — Inpatient Hospital Stay (HOSPITAL_COMMUNITY)
Admission: AD | Admit: 2017-06-26 | Discharge: 2017-06-26 | Disposition: A | Discharge status: Home / Self Care | Payer: Medicaid Other | Source: Ambulatory Visit | Attending: Obstetrics and Gynecology | Admitting: Obstetrics and Gynecology

## 2017-06-26 ENCOUNTER — Inpatient Hospital Stay (HOSPITAL_COMMUNITY): Payer: Medicaid Other

## 2017-06-26 DIAGNOSIS — N939 Abnormal uterine and vaginal bleeding, unspecified: Secondary | ICD-10-CM | POA: Insufficient documentation

## 2017-06-26 DIAGNOSIS — F419 Anxiety disorder, unspecified: Secondary | ICD-10-CM | POA: Diagnosis not present

## 2017-06-26 DIAGNOSIS — Z888 Allergy status to other drugs, medicaments and biological substances status: Secondary | ICD-10-CM | POA: Diagnosis not present

## 2017-06-26 DIAGNOSIS — R7303 Prediabetes: Secondary | ICD-10-CM | POA: Diagnosis not present

## 2017-06-26 DIAGNOSIS — J45909 Unspecified asthma, uncomplicated: Secondary | ICD-10-CM | POA: Insufficient documentation

## 2017-06-26 DIAGNOSIS — Z79899 Other long term (current) drug therapy: Secondary | ICD-10-CM | POA: Insufficient documentation

## 2017-06-26 DIAGNOSIS — F329 Major depressive disorder, single episode, unspecified: Secondary | ICD-10-CM | POA: Insufficient documentation

## 2017-06-26 DIAGNOSIS — K219 Gastro-esophageal reflux disease without esophagitis: Secondary | ICD-10-CM | POA: Diagnosis not present

## 2017-06-26 DIAGNOSIS — Z9889 Other specified postprocedural states: Secondary | ICD-10-CM | POA: Insufficient documentation

## 2017-06-26 DIAGNOSIS — O26899 Other specified pregnancy related conditions, unspecified trimester: Secondary | ICD-10-CM

## 2017-06-26 DIAGNOSIS — O039 Complete or unspecified spontaneous abortion without complication: Secondary | ICD-10-CM | POA: Diagnosis present

## 2017-06-26 DIAGNOSIS — N83209 Unspecified ovarian cyst, unspecified side: Secondary | ICD-10-CM | POA: Diagnosis not present

## 2017-06-26 DIAGNOSIS — Z3A08 8 weeks gestation of pregnancy: Secondary | ICD-10-CM | POA: Diagnosis not present

## 2017-06-26 DIAGNOSIS — R109 Unspecified abdominal pain: Secondary | ICD-10-CM | POA: Diagnosis not present

## 2017-06-26 DIAGNOSIS — O209 Hemorrhage in early pregnancy, unspecified: Secondary | ICD-10-CM

## 2017-06-26 DIAGNOSIS — Z91040 Latex allergy status: Secondary | ICD-10-CM | POA: Insufficient documentation

## 2017-06-26 LAB — CBC
HEMATOCRIT: 34 % — AB (ref 36.0–46.0)
Hemoglobin: 10.9 g/dL — ABNORMAL LOW (ref 12.0–15.0)
MCH: 26.1 pg (ref 26.0–34.0)
MCHC: 32.1 g/dL (ref 30.0–36.0)
MCV: 81.5 fL (ref 78.0–100.0)
PLATELETS: 302 10*3/uL (ref 150–400)
RBC: 4.17 MIL/uL (ref 3.87–5.11)
RDW: 14.4 % (ref 11.5–15.5)
WBC: 8.6 10*3/uL (ref 4.0–10.5)

## 2017-06-26 MED ORDER — IBUPROFEN 600 MG PO TABS: 600.0000 mg | ORAL_TABLET | Freq: Four times a day (QID) | ORAL | 1 refills | Status: DC | PRN

## 2017-06-26 NOTE — MAU Provider Note (Signed)
Chief Complaint: Vaginal Bleeding and Abdominal Pain   None     SUBJECTIVE HPI: Laurie Reed is a 32 y.o. [id number] at [redacted]w[redacted]d by LMP who presents to maternity admissions reporting onset of heavy bleeding with clots and abdominal cramping this morning. She passed several large clots and then bleeding slowed down by the time she arrived in MAU.  Her pain is also improved since arrival in MAU but is still present, low cramping pain in her low abdomen.  There are no other associated symptoms.  She has not tried any treatments.    HPI  Past Medical History:  Diagnosis Date  . Acid reflux 2004  . Anemia   . Anxiety 2008  . Asthma 1993   no previous intubations or hospitalizations   . Chlamydia   . Depression 2008   no meds, currenly ok  . Environmental allergies   . Migraines   . Miscarriage   . Ovarian cyst   . Pre-diabetes   . Ulcer of the stomach and intestine 2004  . Urinary tract infection    Past Surgical History:  Procedure Laterality Date  . COLONOSCOPY  2004   . IRRIGATION AND DEBRIDEMENT SEBACEOUS CYST N/A 06/01/2017   Procedure: EXCISION SEBACEOUS CYST ON SCALP;  Surgeon: Ancil Linsey, MD;  Location: [facility] ORS;  Service: General;  Laterality: N/A;  . MULTIPLE TOOTH EXTRACTIONS  2015   6 teeth removed   . WISDOM TOOTH EXTRACTION  2007   Social History   Socioeconomic History  . Marital status: Divorced    Spouse name: Not on file  . Number of children: 3  . Years of education: 17   . Highest education level: Not on file  Social Needs  . Financial resource strain: Not on file  . Food insecurity - worry: Not on file  . Food insecurity - inability: Not on file  . Transportation needs - medical: Not on file  . Transportation needs - non-medical: Not on file  Occupational History    Employer: [employer]  Tobacco Use  . Smoking status: Never Smoker  . Smokeless tobacco: Never Used  Substance and Sexual Activity  . Alcohol use: No    Alcohol/week:  0.0 oz  . Drug use: No  . Sexual activity: Yes    Birth control/protection: None  Other Topics Concern  . Not on file  Social History Narrative   Lives with 3 children.   Immediate family in [place].    Born in [place].   Raised in [place], [place].    Lives in a 2 story home.   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    Current Outpatient Medications on File Prior to Encounter  Medication Sig Dispense Refill  . acetaminophen (TYLENOL) 500 MG tablet Take 1,000 mg by mouth every 6 (six) hours as needed.    Marland Kitchen albuterol (ACCUNEB) 0.63 MG/3ML nebulizer solution Inhale 1 ampule by nebulization every six (6) hours as needed for wheezing. 75 mL 5  . albuterol (PROVENTIL HFA;VENTOLIN HFA) 108 (90 Base) MCG/ACT inhaler Inhale 2 puffs into the lungs every 6 (six) hours as needed for wheezing or shortness of breath. 1 Inhaler 1  . clindamycin-benzoyl peroxide (BENZACLIN WITH PUMP) gel Apply topically 2 (two) times daily. (Patient taking differently: Apply 1 application topically 2 (two) times daily. ) 50 g 1  . metoCLOPramide (REGLAN) 10 MG tablet Take 1 tablet (  10 mg total) by mouth 3 (three) times daily before meals. 60 tablet 5  . Prenatal Multivit-Min-Fe-FA (PRENATAL VITAMINS) 0.8 MG tablet Take 1 tablet by mouth daily. 30 tablet 12  . promethazine (PHENERGAN) 25 MG tablet Take 1 tablet (25 mg total) by mouth every 4 (four) hours as needed for nausea or vomiting. 12 tablet 1   Allergies  Allergen Reactions  . Other Anaphylaxis    "20 different types of trees"  . Latex Hives and Itching  . Zofran Itching    ROS:  Review of Systems  Constitutional: Negative for chills, fatigue and fever.  Respiratory: Negative for shortness of breath.   Cardiovascular: Negative for chest pain.  Gastrointestinal: Positive for abdominal pain. Negative for nausea and vomiting.  Genitourinary: Positive for pelvic pain  and vaginal bleeding. Negative for difficulty urinating, dysuria, flank pain, vaginal discharge and vaginal pain.  Neurological: Negative for dizziness and headaches.  Psychiatric/Behavioral: Negative.      I have reviewed patient's Past Medical Hx, Surgical Hx, Family Hx, Social Hx, medications and allergies.   Physical Exam   Patient Vitals for the past 24 hrs:  BP Temp Pulse Resp Height Weight  06/26/17 0746 129/75 98.4 F (36.9 C) 90 18 5\' 2"  (1.575 m) 259 lb (117.5 kg)   Constitutional: Well-developed, well-nourished female in no acute distress.  Cardiovascular: normal rate Respiratory: normal effort GI: Abd soft, non-tender. Pos BS x 4 MS: Extremities nontender, no edema, normal ROM Neurologic: Alert and oriented x 4.  GU: Neg CVAT.  PELVIC EXAM: Deferred, pad examined, soaked 1/2 pad in >1 hour in MAU   LAB RESULTS Results for orders placed or performed during the hospital encounter of 06/26/17 (from the past 24 hour(s))  CBC     Status: Abnormal   Collection Time: 06/26/17  8:48 AM  Result Value Ref Range   WBC 8.6 4.0 - 10.5 K/uL   RBC 4.17 3.87 - 5.11 MIL/uL   Hemoglobin 10.9 (L) 12.0 - 15.0 g/dL   HCT 41.334.0 (L) 24.436.0 - 01.046.0 %   MCV 81.5 78.0 - 100.0 fL   MCH 26.1 26.0 - 34.0 pg   MCHC 32.1 30.0 - 36.0 g/dL   RDW 27.214.4 53.611.5 - 64.415.5 %   Platelets 302 150 - 400 K/uL       IMAGING [place] Ob Transvaginal  Result Date: 06/26/2017 CLINICAL DATA:  32 year old pregnant female with heavy bleeding and passing clots. Ultrasound yesterday demonstrated single living intrauterine gestation with gestational age of [redacted] weeks 6 days. EXAM: TRANSVAGINAL OB ULTRASOUND TECHNIQUE: Transvaginal ultrasound was performed for complete evaluation of the gestation as well as the maternal uterus, adnexal regions, and pelvic cul-de-sac. COMPARISON:  06/25/2017 FINDINGS: Intrauterine gestational sac: None Yolk sac:  Not Visualized. Embryo:  Not Visualized. Subchorionic hemorrhage:  None visualized.  Maternal uterus/adnexae: The ovaries bilaterally are unremarkable. A trace amount of free pelvic fluid is noted. IMPRESSION: Living intrauterine gestation identified yesterday is no longer visualized, compatible with spontaneous abortion. Electronically Signed   By: Harmon PierJeffrey  Hu M.D.   On: 06/26/2017 08:33   [place] Ob Transvaginal  Result Date: 06/25/2017 CLINICAL DATA:  Bleeding.  Back pain.  Pregnant patient. EXAM: OBSTETRIC <14 WK US AND TRANSVAGINAL OB US TECHNIQUE: Both transabdominal and transvaginal ultrasound examinations were performed for complete evaluation of the gestation as well as the maternal uterus, adnexal regions, and pelvic cul-de-sac. Transvaginal technique was performed to assess early pregnancy. COMPARISON:  06/20/2017 FINDINGS: Intrauterine gestational sac: Single Yolk sac:  Visualized.  Embryo:  Visualized. Cardiac Activity: Visualized. Heart Rate: 140 bpm CRL:  9 mm   6 w   6 d                  [place] EDC: 02/12/2018 Subchorionic hemorrhage:  None visualized. Maternal uterus/adnexae: No uterine masses. Cervix is closed. Corpus luteum arises from the right ovary. Ovaries otherwise unremarkable. No adnexal masses. No pelvic free fluid. IMPRESSION: 1. Single live intrauterine pregnancy with a measured gestational age of [redacted] weeks and 6 days, showing normal progression when compared to the recent prior ultrasound. 2. No evidence of a pregnancy complication. No emergent maternal abnormality. Electronically Signed   By: Amie Portland M.D.   On: 06/25/2017 23:11   MAU Management/MDM: Ordered labs and [place] and reviewed results.  Results of today's [place] c/w miscarriage.  Hgb stable with reduced blood loss while pt in MAU.  Discussed miscarriage with pt, questions answered. F/U in office in 2 weeks for hcg and provider visit.  Return to MAU if pain or bleeding worsen.  Rx for ibuprofen 600 mg Q 6 hours PRN.  Pt discharged with strict bleeding precautions.  ASSESSMENT 1. SAB (spontaneous abortion)   2.  Vaginal bleeding in pregnancy, first trimester   3. Abdominal pain affecting pregnancy     PLAN Discharge home Allergies as of 06/26/2017      Reactions   Other Anaphylaxis   "20 different types of trees"   Latex Hives, Itching   Zofran Itching      Medication List    TAKE these medications   acetaminophen 500 MG tablet Commonly known as:  TYLENOL Take 1,000 mg by mouth every 6 (six) hours as needed.   albuterol 0.63 MG/3ML nebulizer solution Commonly known as:  ACCUNEB Inhale 1 ampule by nebulization every six (6) hours as needed for wheezing.   albuterol 108 (90 Base) MCG/ACT inhaler Commonly known as:  PROVENTIL HFA;VENTOLIN HFA Inhale 2 puffs into the lungs every 6 (six) hours as needed for wheezing or shortness of breath.   clindamycin-benzoyl peroxide gel Commonly known as:  BENZACLIN WITH PUMP Apply topically 2 (two) times daily. What changed:  how much to take   ibuprofen 600 MG tablet Commonly known as:  ADVIL,MOTRIN Take 1 tablet (600 mg total) by mouth every 6 (six) hours as needed.   metoCLOPramide 10 MG tablet Commonly known as:  REGLAN Take 1 tablet (10 mg total) by mouth 3 (three) times daily before meals.   Prenatal Vitamins 0.8 MG tablet Take 1 tablet by mouth daily.   promethazine 25 MG tablet Commonly known as:  PHENERGAN Take 1 tablet (25 mg total) by mouth every 4 (four) hours as needed for nausea or vomiting.      Follow-up Information    [facility].   Specialty:  Obstetrics and Gynecology Why:  The office will call with appointment in 2 weeks.  Return to MAU as needed for emergencies. Contact information: [address] [place] [address] [phone]          Sharen Counter Certified Nurse-Midwife 06/26/2017  9:18 AM

## 2017-06-26 NOTE — MAU Note (Signed)
Patient was seen last night for vaginal bleeding was told US was good and cervix was closed. Bleeding is now heavy and passing clots.

## 2017-06-30 ENCOUNTER — Encounter: Payer: Medicaid Other | Admitting: Surgery

## 2017-07-06 ENCOUNTER — Other Ambulatory Visit (HOSPITAL_COMMUNITY): Payer: Medicaid Other

## 2017-07-12 ENCOUNTER — Other Ambulatory Visit: Payer: Self-pay

## 2017-07-12 ENCOUNTER — Encounter (HOSPITAL_COMMUNITY): Payer: Self-pay | Admitting: Emergency Medicine

## 2017-07-12 ENCOUNTER — Ambulatory Visit (HOSPITAL_COMMUNITY)
Admission: EM | Admit: 2017-07-12 | Discharge: 2017-07-12 | Disposition: A | Discharge status: Home / Self Care | Payer: BLUE CROSS/BLUE SHIELD | Attending: Family Medicine | Admitting: Family Medicine

## 2017-07-12 DIAGNOSIS — R519 Headache, unspecified: Secondary | ICD-10-CM

## 2017-07-12 DIAGNOSIS — R51 Headache: Secondary | ICD-10-CM

## 2017-07-12 MED ORDER — METOCLOPRAMIDE HCL 5 MG/ML IJ SOLN
INTRAMUSCULAR | Status: AC
  Filled 2017-07-12: qty 2

## 2017-07-12 MED ORDER — KETOROLAC TROMETHAMINE 60 MG/2ML IM SOLN
INTRAMUSCULAR | Status: AC
  Filled 2017-07-12: qty 2

## 2017-07-12 MED ORDER — IPRATROPIUM BROMIDE 0.06 % NA SOLN: 2.0000 | Freq: Four times a day (QID) | NASAL | 0 refills | Status: DC

## 2017-07-12 MED ORDER — METOCLOPRAMIDE HCL 5 MG/ML IJ SOLN
5.0000 mg | Freq: Once | INTRAMUSCULAR | Status: AC
  Administered 2017-07-12: 5 mg via INTRAMUSCULAR

## 2017-07-12 MED ORDER — DEXAMETHASONE SODIUM PHOSPHATE 10 MG/ML IJ SOLN
INTRAMUSCULAR | Status: AC
  Filled 2017-07-12: qty 1

## 2017-07-12 MED ORDER — KETOROLAC TROMETHAMINE 60 MG/2ML IM SOLN
60.0000 mg | Freq: Once | INTRAMUSCULAR | Status: AC
  Administered 2017-07-12: 60 mg via INTRAMUSCULAR

## 2017-07-12 MED ORDER — DEXAMETHASONE SODIUM PHOSPHATE 10 MG/ML IJ SOLN
10.0000 mg | Freq: Once | INTRAMUSCULAR | Status: AC
  Administered 2017-07-12: 10 mg via INTRAMUSCULAR

## 2017-07-12 NOTE — Discharge Instructions (Signed)
Toradol, Reglan, Decadron injection in office today.  As discussed, your headache could also be due to sinus pressure.  Start Flonase, Atrovent for nasal congestion/drainage. You can use over the counter nasal saline rinse such as neti pot for nasal congestion. Keep hydrated, your urine should be clear to pale yellow in color. Follow up with neurologist for further management of headache/migraines. Follow up with OBGYN as scheduled for evaluation.

## 2017-07-12 NOTE — ED Triage Notes (Addendum)
Pt reports a migraine x3 days.  Pt is out of her Topomax.  She has been taking 1,000mg  of Tylenol with no relief.  Pt had a miscarriage Feb. 10.

## 2017-07-12 NOTE — ED Provider Notes (Signed)
[facility]    CSN: [id number] Arrival date & time: 07/12/17  1613     History   Chief Complaint Chief Complaint  Patient presents with  . Migraine    HPI Laurie Reed is a 32 y.o. female.   32 year old female with history of migraines comes in for 3-day history of headache.  States pain is located at the frontal and bilateral parietal region, tight sensation.  She has had photophobia, phonophobia.  Nausea without vomiting.  She is also had a few day history of rhinorrhea, nasal congestion.  Denies cough, sore throat.  Denies fever, chills, night sweats.  Denies confusion, dizziness, weakness, syncope.  States she usually takes Topamax to prevent migraines, has been out of medication.  Has been taking Tylenol without relief.  Never smoker.  Patient had a miscarriage February 10, bleeding has subsided and has no longer had spotting.  She has an appointment with her OB/GYN in 3 days for reevaluation.  Denies abdominal pain.      Past Medical History:  Diagnosis Date  . Acid reflux 2004  . Anemia   . Anxiety 2008  . Asthma 1993   no previous intubations or hospitalizations   . Chlamydia   . Depression 2008   no meds, currenly ok  . Environmental allergies   . Migraines   . Miscarriage   . Ovarian cyst   . Pre-diabetes   . Ulcer of the stomach and intestine 2004  . Urinary tract infection     Patient Active Problem List   Diagnosis Date Noted  . Inflamed sebaceous cyst   . Diabetes mellitus screening 03/02/2017  . Skin pustule 12/02/2016  . Vagina itching 12/02/2016  . Nasal turbinate hypertrophy 11/01/2016  . Chronic nonintractable headache 11/01/2016  . Seasonal allergic rhinitis 11/01/2016  . Cervical myelopathy (HCC) 07/01/2016  . Foot swelling 02/18/2016  . Bilateral hand pain 10/30/2015  . Right carpal tunnel syndrome 10/30/2015  . Polymyalgia (HCC) 10/30/2015  . Polyarthralgia 10/30/2015  . Astigmatism 09/18/2015  . Asthma 06/30/2015    . Scalp lesion 06/30/2015  . Vitamin D deficiency 01/22/2015  . Severe obesity (BMI >= 40) (HCC) 01/21/2015  . Foot pain, bilateral 01/21/2015  . Fatigue 01/21/2015  . Sacroiliac joint dysfunction of left side 01/21/2015  . Tinea capitis 01/21/2015  . Anxiety and depression 01/21/2015  . Patellofemoral arthralgia of right knee 01/08/2015  . Ligamentous laxity of right ankle 01/08/2015  . Right knee pain 01/11/2014  . Headache(784.0) 01/11/2014    Past Surgical History:  Procedure Laterality Date  . COLONOSCOPY  2004   . IRRIGATION AND DEBRIDEMENT SEBACEOUS CYST N/A 06/01/2017   Procedure: EXCISION SEBACEOUS CYST ON SCALP;  Surgeon: Ancil Linsey, MD;  Location: [facility] ORS;  Service: General;  Laterality: N/A;  . MULTIPLE TOOTH EXTRACTIONS  2015   6 teeth removed   . WISDOM TOOTH EXTRACTION  2007    OB History    Gravida Para Term Preterm AB Living   6 3 3  0 2 3   SAB TAB Ectopic Multiple Live Births   2 0 0 0 3       Home Medications    Prior to Admission medications   Medication Sig Start Date End Date Taking? Authorizing Provider  acetaminophen (TYLENOL) 500 MG tablet Take 1,000 mg by mouth every 6 (six) hours as needed.   Yes [provider]  metoCLOPramide (REGLAN) 10 MG tablet Take 1 tablet (10 mg total) by mouth 3 (three) times  daily before meals. 06/12/17  Yes Leftwich-Kirby, Wilmer FloorLisa A, CNM  albuterol (ACCUNEB) 0.63 MG/3ML nebulizer solution Inhale 1 ampule by nebulization every six (6) hours as needed for wheezing. 12/02/16   Funches, Gerilyn NestleJosalyn, MD  albuterol (PROVENTIL HFA;VENTOLIN HFA) 108 (90 Base) MCG/ACT inhaler Inhale 2 puffs into the lungs every 6 (six) hours as needed for wheezing or shortness of breath. 03/18/17   Marcine MatarJohnson, Deborah B, MD  clindamycin-benzoyl peroxide (BENZACLIN WITH PUMP) gel Apply topically 2 (two) times daily. Patient taking differently: Apply 1 application topically 2 (two) times daily.  12/29/16   Casey BurkittFitzgerald, Hillary Moen, MD   ibuprofen (ADVIL,MOTRIN) 600 MG tablet Take 1 tablet (600 mg total) by mouth every 6 (six) hours as needed. 06/26/17   Leftwich-Kirby, Wilmer FloorLisa A, CNM  ipratropium (ATROVENT) 0.06 % nasal spray Place 2 sprays into both nostrils 4 (four) times daily. 07/12/17   Cathie HoopsYu, Jennae Hakeem V, PA-C  Prenatal Multivit-Min-Fe-FA (PRENATAL VITAMINS) 0.8 MG tablet Take 1 tablet by mouth daily. 06/12/17   Leftwich-Kirby, Wilmer FloorLisa A, CNM  promethazine (PHENERGAN) 25 MG tablet Take 1 tablet (25 mg total) by mouth every 4 (four) hours as needed for nausea or vomiting. 06/03/17   Allie Bossierove, Myra C, MD    Family History Family History  Problem Relation Age of Onset  . Asthma Mother   . Heart disease Mother   . Diabetes Mother   . Hypertension Mother   . Hypertension Father   . Asthma Maternal Aunt   . Hypertension Maternal Aunt   . Diabetes Maternal Aunt   . Asthma Maternal Uncle   . Hypertension Maternal Uncle   . Diabetes Maternal Uncle   . Heart disease Maternal Grandmother   . Diabetes Maternal Grandmother   . Hypertension Maternal Grandmother   . Heart disease Maternal Grandfather   . Diabetes Maternal Grandfather   . Hypertension Maternal Grandfather   . Hypotension Neg Hx   . Anesthesia problems Neg Hx   . Malignant hyperthermia Neg Hx   . Pseudochol deficiency Neg Hx   . Cancer Neg Hx     Social History Social History   Tobacco Use  . Smoking status: Never Smoker  . Smokeless tobacco: Never Used  Substance Use Topics  . Alcohol use: No    Alcohol/week: 0.0 oz  . Drug use: No     Allergies   Other; Latex; and Zofran   Review of Systems Review of Systems  Reason unable to perform ROS: See HPI as above.     Physical Exam Triage Vital Signs ED Triage Vitals  Enc Vitals Group     BP 07/12/17 1738 123/85     Pulse Rate 07/12/17 1738 75     Resp --      Temp 07/12/17 1738 98.2 F (36.8 C)     Temp Source 07/12/17 1738 Oral     SpO2 07/12/17 1738 100 %     Weight --      Height --      Head  Circumference --      Peak Flow --      Pain Score 07/12/17 1734 8     Pain Loc --      Pain Edu? --      Excl. in GC? --    No data found.  Updated Vital Signs BP 123/85 (BP Location: Left Arm)   Pulse 75   Temp 98.2 F (36.8 C) (Oral)   LMP 05/01/2017 (Exact Date) Comment: Pt had a miscarriage 06/26/17  SpO2  100%   Breastfeeding? Unknown   Physical Exam  Constitutional: She is oriented to person, place, and time. She appears well-developed and well-nourished. No distress.  HENT:  Head: Normocephalic and atraumatic.  Right Ear: Tympanic membrane, external ear and ear canal normal. Tympanic membrane is not erythematous and not bulging.  Left Ear: Tympanic membrane, external ear and ear canal normal. Tympanic membrane is not erythematous and not bulging.  Nose: Right sinus exhibits frontal sinus tenderness. Right sinus exhibits no maxillary sinus tenderness. Left sinus exhibits frontal sinus tenderness. Left sinus exhibits no maxillary sinus tenderness.  Mouth/Throat: Uvula is midline, oropharynx is clear and moist and mucous membranes are normal.  Eyes: Conjunctivae and EOM are normal. Pupils are equal, round, and reactive to light.  Neck: Normal range of motion. Neck supple.  Cardiovascular: Normal rate, regular rhythm and normal heart sounds. Exam reveals no gallop and no friction rub.  No murmur heard. Pulmonary/Chest: Effort normal and breath sounds normal. She has no decreased breath sounds. She has no wheezes. She has no rhonchi. She has no rales.  Lymphadenopathy:    She has no cervical adenopathy.  Neurological: She is alert and oriented to person, place, and time. She has normal strength. She is not disoriented. No cranial nerve deficit or sensory deficit. Coordination and gait normal. GCS eye subscore is 4. GCS verbal subscore is 5. GCS motor subscore is 6.  Skin: Skin is warm and dry.  Psychiatric: She has a normal mood and affect. Her behavior is normal. Judgment normal.    UC Treatments / Results  Labs (all labs ordered are listed, but only abnormal results are displayed) Labs Reviewed - No data to display  EKG  EKG Interpretation None       Radiology No results found.  Procedures Procedures (including critical care time)  Medications Ordered in UC Medications  ketorolac (TORADOL) injection 60 mg (60 mg Intramuscular Given 07/12/17 1821)  metoCLOPramide (REGLAN) injection 5 mg (5 mg Intramuscular Given 07/12/17 1821)  dexamethasone (DECADRON) injection 10 mg (10 mg Intramuscular Given 07/12/17 1821)     Initial Impression / Assessment and Plan / UC Course  I have reviewed the triage vital signs and the nursing notes.  Pertinent labs & imaging results that were available during my care of the patient were reviewed by me and considered in my medical decision making (see chart for details).    No alarming signs on exam.  Toradol, Reglan, Decadron injection in office today.  Also discussed possible sinus pressure contributing to symptoms.  Start Atrovent, Flonase nasal spray for nasal congestion, rhinorrhea.  Push fluids.  Patient to follow-up with OB/GYN as scheduled in 3 days.  Patient to follow-up with neurologist for further management and evaluation of migraines.  Return precautions given.  Patient expresses understanding and agrees to plan.  Final Clinical Impressions(s) / UC Diagnoses   Final diagnoses:  Acute intractable headache, unspecified headache type    ED Discharge Orders        Ordered    ipratropium (ATROVENT) 0.06 % nasal spray  4 times daily     07/12/17 1819       Belinda Fisher, PA-C 07/12/17 1826

## 2017-07-15 ENCOUNTER — Encounter: Payer: BLUE CROSS/BLUE SHIELD | Admitting: Obstetrics and Gynecology

## 2017-07-18 ENCOUNTER — Ambulatory Visit: Payer: Medicaid Other | Admitting: Family Medicine

## 2017-07-18 ENCOUNTER — Encounter: Payer: BLUE CROSS/BLUE SHIELD | Admitting: Medical

## 2017-07-20 ENCOUNTER — Encounter: Payer: Self-pay | Admitting: Obstetrics

## 2017-08-01 ENCOUNTER — Inpatient Hospital Stay (HOSPITAL_COMMUNITY): Payer: Medicaid Other

## 2017-08-01 ENCOUNTER — Inpatient Hospital Stay (HOSPITAL_COMMUNITY)
Admission: AD | Admit: 2017-08-01 | Discharge: 2017-08-01 | Disposition: A | Discharge status: Home / Self Care | Payer: Medicaid Other | Source: Ambulatory Visit | Attending: Obstetrics & Gynecology | Admitting: Obstetrics & Gynecology

## 2017-08-01 ENCOUNTER — Encounter (HOSPITAL_COMMUNITY): Payer: Self-pay | Admitting: *Deleted

## 2017-08-01 DIAGNOSIS — R109 Unspecified abdominal pain: Secondary | ICD-10-CM | POA: Diagnosis not present

## 2017-08-01 DIAGNOSIS — Z79899 Other long term (current) drug therapy: Secondary | ICD-10-CM | POA: Insufficient documentation

## 2017-08-01 DIAGNOSIS — R7303 Prediabetes: Secondary | ICD-10-CM | POA: Insufficient documentation

## 2017-08-01 DIAGNOSIS — O3680X0 Pregnancy with inconclusive fetal viability, not applicable or unspecified: Secondary | ICD-10-CM | POA: Diagnosis present

## 2017-08-01 DIAGNOSIS — O26891 Other specified pregnancy related conditions, first trimester: Secondary | ICD-10-CM

## 2017-08-01 DIAGNOSIS — O26899 Other specified pregnancy related conditions, unspecified trimester: Secondary | ICD-10-CM

## 2017-08-01 DIAGNOSIS — K219 Gastro-esophageal reflux disease without esophagitis: Secondary | ICD-10-CM | POA: Insufficient documentation

## 2017-08-01 DIAGNOSIS — Z8249 Family history of ischemic heart disease and other diseases of the circulatory system: Secondary | ICD-10-CM | POA: Insufficient documentation

## 2017-08-01 DIAGNOSIS — F419 Anxiety disorder, unspecified: Secondary | ICD-10-CM | POA: Diagnosis not present

## 2017-08-01 DIAGNOSIS — Z888 Allergy status to other drugs, medicaments and biological substances status: Secondary | ICD-10-CM | POA: Insufficient documentation

## 2017-08-01 DIAGNOSIS — Z791 Long term (current) use of non-steroidal anti-inflammatories (NSAID): Secondary | ICD-10-CM | POA: Insufficient documentation

## 2017-08-01 DIAGNOSIS — Z825 Family history of asthma and other chronic lower respiratory diseases: Secondary | ICD-10-CM | POA: Diagnosis not present

## 2017-08-01 DIAGNOSIS — Z91040 Latex allergy status: Secondary | ICD-10-CM | POA: Diagnosis not present

## 2017-08-01 DIAGNOSIS — Z3490 Encounter for supervision of normal pregnancy, unspecified, unspecified trimester: Secondary | ICD-10-CM

## 2017-08-01 DIAGNOSIS — Z9889 Other specified postprocedural states: Secondary | ICD-10-CM | POA: Insufficient documentation

## 2017-08-01 DIAGNOSIS — J45909 Unspecified asthma, uncomplicated: Secondary | ICD-10-CM | POA: Insufficient documentation

## 2017-08-01 DIAGNOSIS — Z833 Family history of diabetes mellitus: Secondary | ICD-10-CM | POA: Insufficient documentation

## 2017-08-01 DIAGNOSIS — F329 Major depressive disorder, single episode, unspecified: Secondary | ICD-10-CM | POA: Insufficient documentation

## 2017-08-01 LAB — HCG, QUANTITATIVE, PREGNANCY: hCG, Beta Chain, Quant, S: 236 m[IU]/mL — ABNORMAL HIGH (ref <5)

## 2017-08-01 LAB — WET PREP, GENITAL
CLUE CELLS WET PREP: NONE SEEN
Sperm: NONE SEEN
Trich, Wet Prep: NONE SEEN
WBC WET PREP: NONE SEEN
Yeast Wet Prep HPF POC: NONE SEEN

## 2017-08-01 LAB — URINALYSIS, ROUTINE W REFLEX MICROSCOPIC
BACTERIA UA: NONE SEEN
BILIRUBIN URINE: NEGATIVE
GLUCOSE, UA: 50 mg/dL — AB
KETONES UR: NEGATIVE mg/dL
LEUKOCYTES UA: NEGATIVE
NITRITE: NEGATIVE
Protein, ur: NEGATIVE mg/dL
SPECIFIC GRAVITY, URINE: 1.014 (ref 1.005–1.030)
pH: 6 (ref 5.0–8.0)

## 2017-08-01 LAB — GLUCOSE, CAPILLARY: Glucose-Capillary: 87 mg/dL (ref 65–99)

## 2017-08-01 LAB — CBC
HEMATOCRIT: 34.6 % — AB (ref 36.0–46.0)
Hemoglobin: 11.2 g/dL — ABNORMAL LOW (ref 12.0–15.0)
MCH: 26.1 pg (ref 26.0–34.0)
MCHC: 32.4 g/dL (ref 30.0–36.0)
MCV: 80.7 fL (ref 78.0–100.0)
PLATELETS: 348 10*3/uL (ref 150–400)
RBC: 4.29 MIL/uL (ref 3.87–5.11)
RDW: 14.1 % (ref 11.5–15.5)
WBC: 11.2 10*3/uL — AB (ref 4.0–10.5)

## 2017-08-01 LAB — HEMOGLOBIN A1C
HEMOGLOBIN A1C: 5.2 % (ref 4.8–5.6)
MEAN PLASMA GLUCOSE: 102.54 mg/dL

## 2017-08-01 MED ORDER — KETOROLAC TROMETHAMINE 60 MG/2ML IM SOLN
60.0000 mg | Freq: Once | INTRAMUSCULAR | Status: AC
  Administered 2017-08-01: 60 mg via INTRAMUSCULAR
  Filled 2017-08-01: qty 2

## 2017-08-01 NOTE — MAU Note (Signed)
Cramping in her stomach comes and goes, pain in RLQ is constant.  Did not have follow up after miscarriage due to children with her.

## 2017-08-01 NOTE — MAU Provider Note (Signed)
History     CSN: [id number]  Arrival date and time: 08/01/17 1633   First Provider Initiated Contact with Patient 08/01/17 1735      Chief Complaint  Patient presents with  . Abdominal Pain   HPI   [name] is a 32 y.o. female [phone] non-pregnant female here in MAU with abdominal cramping. Says the cramping started this morning. The pain is located in her LLQ. The pain is cramp like. Sometimes the pain is sharp and sometimes its dull. No bleeding. Has not tried anything for the pain.   Had a miscarriage back on 2/10; was not able to follow up due to childcare issues. Says she has had unprotected sex since her SAB.   OB History    Gravida Para Term Preterm AB Living   6 3 3  0 3 3   SAB TAB Ectopic Multiple Live Births   3 0 0 0 3      Past Medical History:  Diagnosis Date  . Acid reflux 2004  . Anemia   . Anxiety 2008  . Asthma 1993   no previous intubations or hospitalizations   . Chlamydia   . Depression 2008   no meds, currenly ok  . Environmental allergies   . Migraines   . Miscarriage   . Ovarian cyst   . Pre-diabetes   . Ulcer of the stomach and intestine 2004  . Urinary tract infection     Past Surgical History:  Procedure Laterality Date  . COLONOSCOPY  2004   . IRRIGATION AND DEBRIDEMENT SEBACEOUS CYST N/A 06/01/2017   Procedure: EXCISION SEBACEOUS CYST ON SCALP;  Surgeon: Ancil Linsey, MD;  Location: [facility] ORS;  Service: General;  Laterality: N/A;  . MULTIPLE TOOTH EXTRACTIONS  2015   6 teeth removed   . WISDOM TOOTH EXTRACTION  2007    Family History  Problem Relation Age of Onset  . Asthma Mother   . Heart disease Mother   . Diabetes Mother   . Hypertension Mother   . Asthma Maternal Aunt   . Hypertension Maternal Aunt   . Diabetes Maternal Aunt   . Asthma Maternal Uncle   . Hypertension Maternal Uncle   . Diabetes Maternal Uncle   . Heart disease Maternal Grandmother   . Diabetes Maternal Grandmother   . Hypertension  Maternal Grandmother   . Heart disease Maternal Grandfather   . Diabetes Maternal Grandfather   . Hypertension Maternal Grandfather   . Hypertension Paternal Grandmother   . Hypotension Neg Hx   . Anesthesia problems Neg Hx   . Malignant hyperthermia Neg Hx   . Pseudochol deficiency Neg Hx   . Cancer Neg Hx     Social History   Tobacco Use  . Smoking status: Never Smoker  . Smokeless tobacco: Never Used  Substance Use Topics  . Alcohol use: No    Alcohol/week: 0.0 oz  . Drug use: No    Allergies:  Allergies  Allergen Reactions  . Other Anaphylaxis    "20 different types of trees"  . Latex Hives and Itching  . Zofran Itching    Medications Prior to Admission  Medication Sig Dispense Refill Last Dose  . acetaminophen (TYLENOL) 500 MG tablet Take 1,000 mg by mouth every 6 (six) hours as needed.   07/12/2017 at 0800  . albuterol (ACCUNEB) 0.63 MG/3ML nebulizer solution Inhale 1 ampule by nebulization every six (6) hours as needed for wheezing. 75 mL 5 Taking  . albuterol (PROVENTIL HFA;VENTOLIN  HFA) 108 (90 Base) MCG/ACT inhaler Inhale 2 puffs into the lungs every 6 (six) hours as needed for wheezing or shortness of breath. 1 Inhaler 1 Taking  . clindamycin-benzoyl peroxide (BENZACLIN WITH PUMP) gel Apply topically 2 (two) times daily. (Patient taking differently: Apply 1 application topically 2 (two) times daily. ) 50 g 1 Taking  . ibuprofen (ADVIL,MOTRIN) 600 MG tablet Take 1 tablet (600 mg total) by mouth every 6 (six) hours as needed. 30 tablet 1   . ipratropium (ATROVENT) 0.06 % nasal spray Place 2 sprays into both nostrils 4 (four) times daily. 15 mL 0   . metoCLOPramide (REGLAN) 10 MG tablet Take 1 tablet (10 mg total) by mouth 3 (three) times daily before meals. 60 tablet 5 Past Month at Unknown time  . Prenatal Multivit-Min-Fe-FA (PRENATAL VITAMINS) 0.8 MG tablet Take 1 tablet by mouth daily. 30 tablet 12 06/25/2017 at Unknown time  . promethazine (PHENERGAN) 25 MG tablet  Take 1 tablet (25 mg total) by mouth every 4 (four) hours as needed for nausea or vomiting. 12 tablet 1 Past Week at Unknown time   Results for orders placed or performed during the hospital encounter of 08/01/17 (from the past 48 hour(s))  Urinalysis, Routine w reflex microscopic     Status: Abnormal   Collection Time: 08/01/17  4:45 PM  Result Value Ref Range   Color, Urine STRAW (A) YELLOW   APPearance CLEAR CLEAR   Specific Gravity, Urine 1.014 1.005 - 1.030   pH 6.0 5.0 - 8.0   Glucose, UA 50 (A) NEGATIVE mg/dL   Hgb urine dipstick MODERATE (A) NEGATIVE   Bilirubin Urine NEGATIVE NEGATIVE   Ketones, ur NEGATIVE NEGATIVE mg/dL   Protein, ur NEGATIVE NEGATIVE mg/dL   Nitrite NEGATIVE NEGATIVE   Leukocytes, UA NEGATIVE NEGATIVE   RBC / HPF 0-5 0 - 5 RBC/hpf   WBC, UA 0-5 0 - 5 WBC/hpf   Bacteria, UA NONE SEEN NONE SEEN   Squamous Epithelial / LPF 0-5 (A) NONE SEEN    Comment: Performed at [facility], [address] [place], [place] [address]  hCG, quantitative, pregnancy     Status: Abnormal   Collection Time: 08/01/17  5:44 PM  Result Value Ref Range   hCG, Beta Chain, Quant, S 236 (H) <5 mIU/mL    Comment:          GEST. AGE      CONC.  (mIU/mL)   <=1 WEEK        5 - 50     2 WEEKS       50 - 500     3 WEEKS       100 - 10,000     4 WEEKS     1,000 - 30,000     5 WEEKS     3,500 - 115,000   6-8 WEEKS     12,000 - 270,000    12 WEEKS     15,000 - 220,000        FEMALE AND NON-PREGNANT FEMALE:     LESS THAN 5 mIU/mL Performed at [facility], [address], [place], [place] [address]   CBC     Status: Abnormal   Collection Time: 08/01/17  5:44 PM  Result Value Ref Range   WBC 11.2 (H) 4.0 - 10.5 K/uL   RBC 4.29 3.87 - 5.11 MIL/uL   Hemoglobin 11.2 (L) 12.0 - 15.0 g/dL   HCT 41.3 (L) 24.4 - 01.0 %  MCV 80.7 78.0 - 100.0 fL   MCH 26.1 26.0 - 34.0 pg   MCHC 32.4 30.0 - 36.0 g/dL   RDW 11.9 14.7 - 82.9 %   Platelets 348 150 - 400 K/uL    Comment: Performed  at [facility], [address] [place], [place] [address]  Wet prep, genital     Status: None   Collection Time: 08/01/17  5:50 PM  Result Value Ref Range   Yeast Wet Prep HPF POC NONE SEEN NONE SEEN   Trich, Wet Prep NONE SEEN NONE SEEN   Clue Cells Wet Prep HPF POC NONE SEEN NONE SEEN   WBC, Wet Prep HPF POC NONE SEEN NONE SEEN   Sperm NONE SEEN     Comment: Performed at [facility], [address], [place], [place] [address]  Glucose, capillary     Status: None   Collection Time: 08/01/17  7:03 PM  Result Value Ref Range   Glucose-Capillary 87 65 - 99 mg/dL   US Ob Less Than 14 Weeks With Ob Transvaginal  Result Date: 08/01/2017 CLINICAL DATA:  Lower abdominal pain, quantitative HCG 236 EXAM: OBSTETRIC <14 WK [place] AND TRANSVAGINAL OB US TECHNIQUE: Both transabdominal and transvaginal ultrasound examinations were performed for complete evaluation of the gestation as well as the maternal uterus, adnexal regions, and pelvic cul-de-sac. Transvaginal technique was performed to assess early pregnancy. COMPARISON:  05/31/2017 FINDINGS: Intrauterine gestational sac: Not seen Yolk sac:  Not seen Embryo:  Not seen Maternal uterus/adnexae: Ovaries are within normal limits. The left ovary measures 2 by 2 x 1.6 cm. The right ovary measures 3.5 x 3.3 x 2.9 cm. Trace free fluid IMPRESSION: No intrauterine pregnancy is visualized. Findings consistent with pregnancy of unknown location, differential of which includes IUP too early to visualize, recent failed pregnancy, and occult ectopic. Suggest trending of HCG with repeat ultrasound as indicated. Trace free fluid in the pelvis Electronically Signed   By: Jasmine Pang M.D.   On: 08/01/2017 19:23   Review of Systems  Constitutional: Negative for fever.  Gastrointestinal: Positive for abdominal pain. Negative for nausea and vomiting.  Genitourinary: Positive for frequency and urgency. Negative for dysuria, vaginal bleeding, vaginal discharge and  vaginal pain.   Physical Exam   Blood pressure 130/72, pulse 94, temperature 99.1 F (37.3 C), resp. rate 18, height 5\' 2"  (1.575 m), weight 253 lb (114.8 kg), unknown if currently breastfeeding.  Physical Exam  Constitutional: She is oriented to person, place, and time. She appears well-developed and well-nourished. No distress.  HENT:  Head: Normocephalic.  Eyes: Pupils are equal, round, and reactive to light.  GI: Soft. She exhibits no distension. There is no tenderness. There is no rebound and no guarding.  Genitourinary:  Genitourinary Comments: Wet prep and GC collected without speculum Enlarged uterus, no CMT   Musculoskeletal: Normal range of motion.  Neurological: She is alert and oriented to person, place, and time.  Skin: Skin is warm. She is not diaphoretic.  Psychiatric: Her behavior is normal.   MAU Course  Procedures  None  MDM  Quant today 236. Patient without follow up from recent SAB. Patient having un protective sex and desires pregnancy. This is likely a new pregnancy. Discussed this in detail with the patient.   Assessment and Plan   A:  1. Pregnancy with fetus of unknown gestational age   2. Abdominal pain affecting pregnancy   3. Pregnancy with gestation of unknown duration     P:  Discharge  home in with strict return precautions Ectopic precautions  Patient needs to return Wednesday for Quant due to her work schedule and her children's schedule.  Pelvic rest Return to MAU if symptoms worsen  Israa Caban, Harolyn RutherfordJennifer I, NP 08/01/2017 8:43 PM

## 2017-08-01 NOTE — Discharge Instructions (Signed)

## 2017-08-01 NOTE — MAU Note (Signed)
Pt reports she has been having sharp pain on her left lower abd. Hurts when she gets up or walks. Had miscarriage Jun 26, 2017

## 2017-08-02 LAB — GC/CHLAMYDIA PROBE AMP (~~LOC~~) NOT AT ARMC
CHLAMYDIA, DNA PROBE: NEGATIVE
NEISSERIA GONORRHEA: NEGATIVE

## 2017-08-03 ENCOUNTER — Inpatient Hospital Stay (HOSPITAL_COMMUNITY)
Admission: AD | Admit: 2017-08-03 | Discharge: 2017-08-03 | Disposition: A | Discharge status: Home / Self Care | Payer: BLUE CROSS/BLUE SHIELD | Source: Ambulatory Visit | Attending: Obstetrics and Gynecology | Admitting: Obstetrics and Gynecology

## 2017-08-03 ENCOUNTER — Encounter: Payer: Self-pay | Admitting: Student

## 2017-08-03 DIAGNOSIS — Z3490 Encounter for supervision of normal pregnancy, unspecified, unspecified trimester: Secondary | ICD-10-CM | POA: Diagnosis not present

## 2017-08-03 DIAGNOSIS — Z09 Encounter for follow-up examination after completed treatment for conditions other than malignant neoplasm: Secondary | ICD-10-CM | POA: Diagnosis present

## 2017-08-03 DIAGNOSIS — O3680X0 Pregnancy with inconclusive fetal viability, not applicable or unspecified: Secondary | ICD-10-CM | POA: Diagnosis not present

## 2017-08-03 LAB — HCG, QUANTITATIVE, PREGNANCY: hCG, Beta Chain, Quant, S: 510 m[IU]/mL — ABNORMAL HIGH (ref <5)

## 2017-08-03 NOTE — MAU Note (Signed)
Pt here for follow up hcg level. Pt denies pain or bleeding.

## 2017-08-03 NOTE — Discharge Instructions (Signed)
Return to care  °· If you have heavier bleeding that soaks through more that 2 pads per hour for an hour or more °· If you bleed so much that you feel like you might pass out or you do pass out °· If you have significant abdominal pain that is not improved with Tylenol  °· If you develop a fever > 100.5 ° °

## 2017-08-03 NOTE — MAU Note (Signed)
Pt not in lobby.  

## 2017-08-03 NOTE — MAU Provider Note (Signed)
Ms. Laurie Reed  is a 32 y.o. [id number]  at Unknown who presents to MAU today for follow-up quant hCG. The patient denies abdominal pain, vaginal bleeding, N/V or fever.   BP 139/79 (BP Location: Right Arm)   Pulse 92   Temp 98.7 F (37.1 C) (Oral)   Resp 18   LMP 05/01/2017 (Exact Date) Comment: Pt had a miscarriage 06/26/17  SpO2 100%   GENERAL: Well-developed, well-nourished female in no acute distress.  HEENT: Normocephalic, atraumatic.   LUNGS: Effort normal HEART: Regular rate  SKIN: Warm, dry and without erythema PSYCH: Normal mood and affect   A: Appropriate rise in quant hCG after 48 hours  P: Discharge home Bleeding/Ectopic precautions discussed Return to MAU on Friday evening/Saturday (d/t work schedule) for repeat HCG -- if continues to rise appropriately will order OP viability scan Patient may return to MAU as needed or if her condition were to change or worsen   Judeth HornLawrence, Jaylenne Hamelin, NP  08/03/2017 8:28 PM

## 2017-08-05 ENCOUNTER — Inpatient Hospital Stay (HOSPITAL_COMMUNITY)
Admission: AD | Admit: 2017-08-05 | Discharge: 2017-08-05 | Disposition: A | Discharge status: Home / Self Care | Payer: BLUE CROSS/BLUE SHIELD | Source: Ambulatory Visit | Attending: Obstetrics and Gynecology | Admitting: Obstetrics and Gynecology

## 2017-08-05 DIAGNOSIS — Z3A00 Weeks of gestation of pregnancy not specified: Secondary | ICD-10-CM | POA: Insufficient documentation

## 2017-08-05 DIAGNOSIS — E349 Endocrine disorder, unspecified: Secondary | ICD-10-CM

## 2017-08-05 DIAGNOSIS — Z3491 Encounter for supervision of normal pregnancy, unspecified, first trimester: Secondary | ICD-10-CM | POA: Insufficient documentation

## 2017-08-05 DIAGNOSIS — O3680X0 Pregnancy with inconclusive fetal viability, not applicable or unspecified: Secondary | ICD-10-CM

## 2017-08-05 LAB — HCG, QUANTITATIVE, PREGNANCY: HCG, BETA CHAIN, QUANT, S: 1338 m[IU]/mL — AB (ref <5)

## 2017-08-05 NOTE — MAU Provider Note (Signed)
Ms. Laurie Reed  is a 32 y.o. [phone] at Unknown who presents to MAU today for follow-up quant hCG after 48 hours. She denies abdominal pain, bleeding, fever. No concerns.   BP 126/77 (BP Location: Right Arm)   Pulse 98   Temp 99 F (37.2 C)   Resp 18   Ht 5\' 2"  (1.575 m)   Wt 114.3 kg (252 lb)   LMP 05/01/2017 (Exact Date) Comment: Pt had a miscarriage 06/26/17  SpO2 100%   BMI 46.09 kg/m   CONSTITUTIONAL: Well-developed, well-nourished female in no acute distress.  ENT: External right and left ear normal.  EYES: EOM intact, conjunctivae normal.  MUSCULOSKELETAL: Normal range of motion.  CARDIOVASCULAR: Regular heart rate RESPIRATORY: Normal effort NEUROLOGICAL: Alert and oriented to person, place, and time.  SKIN: Skin is warm and dry. No rash noted. Not diaphoretic. No erythema. No pallor. PSYCH: Normal mood and affect. Normal behavior. Normal judgment and thought content.  A: Appropriate rise in quant hCG after 48 hours  P: Discharge home First trimester/ectopic precautions discussed Patient will return for follow-up US in 1 week. Order placed. They will call the patient with an appointment time Patient may return to MAU as needed or if her condition were to change or worsen   Laurie Largehelps, Tremell Reimers Y, DO 08/05/2017 8:19 PM

## 2017-08-05 NOTE — MAU Note (Signed)
Here for repeat labs. Denies any concerns. No vag bleeding or pain. Understands to wait in lobby for lab results and will return to Triage for results and plan of care from MAU provider. Agrees with plan.

## 2017-08-05 NOTE — MAU Note (Signed)
Dr Doroteo GlassmanPhelps in Triage to discuss test results and d/c plan with pt. Pt d/c home from triage. Will have u/s in a wk but return sooner for any concerns. Dr Doroteo GlassmanPhelps gave pt d/c instructions and had pt sign

## 2017-08-05 NOTE — Discharge Instructions (Signed)
Ectopic Pregnancy °An ectopic pregnancy happens when a fertilized egg grows outside the uterus. A pregnancy cannot live outside of the uterus. This problem often happens in the fallopian tube. It is often caused by damage to the fallopian tube. °If this problem is found early, you may be treated with medicine. If your tube tears or bursts open (ruptures), you will bleed inside. This is an emergency. You will need surgery. Get help right away. °What are the signs or symptoms? °You may have normal pregnancy symptoms at first. These include: °· Missing your period. °· Feeling sick to your stomach (nauseous). °· Being tired. °· Having tender breasts. ° °Then, you may start to have symptoms that are not normal. These include: °· Pain with sex (intercourse). °· Bleeding from the vagina. This includes light bleeding (spotting). °· Belly (abdomen) or lower belly cramping or pain. This may be felt on one side. °· A fast heartbeat (pulse). °· Passing out (fainting) after going poop (bowel movement). ° °If your tube tears, you may have symptoms such as: °· Really bad pain in the belly or lower belly. This happens suddenly. °· Dizziness. °· Passing out. °· Shoulder pain. ° °Get help right away if: °You have any of these symptoms. This is an emergency. °This information is not intended to replace advice given to you by your health care provider. Make sure you discuss any questions you have with your health care provider. °Document Released: 07/30/2008 Document Revised: 10/09/2015 Document Reviewed: 12/13/2012 °Elsevier Interactive Patient Education © 2017 Elsevier Inc. ° °

## 2017-08-08 ENCOUNTER — Ambulatory Visit (HOSPITAL_COMMUNITY): Payer: BLUE CROSS/BLUE SHIELD

## 2017-08-09 ENCOUNTER — Inpatient Hospital Stay (HOSPITAL_COMMUNITY)
Admission: AD | Admit: 2017-08-09 | Discharge: 2017-08-09 | Disposition: A | Discharge status: Home / Self Care | Payer: BLUE CROSS/BLUE SHIELD | Source: Ambulatory Visit | Attending: Obstetrics and Gynecology | Admitting: Obstetrics and Gynecology

## 2017-08-09 ENCOUNTER — Inpatient Hospital Stay (HOSPITAL_COMMUNITY): Payer: BLUE CROSS/BLUE SHIELD

## 2017-08-09 DIAGNOSIS — O26891 Other specified pregnancy related conditions, first trimester: Secondary | ICD-10-CM | POA: Diagnosis not present

## 2017-08-09 DIAGNOSIS — Z3490 Encounter for supervision of normal pregnancy, unspecified, unspecified trimester: Secondary | ICD-10-CM

## 2017-08-09 DIAGNOSIS — Z3A01 Less than 8 weeks gestation of pregnancy: Secondary | ICD-10-CM | POA: Insufficient documentation

## 2017-08-09 DIAGNOSIS — R102 Pelvic and perineal pain: Secondary | ICD-10-CM | POA: Diagnosis not present

## 2017-08-09 LAB — CBC
HCT: 32.6 % — ABNORMAL LOW (ref 36.0–46.0)
Hemoglobin: 10.4 g/dL — ABNORMAL LOW (ref 12.0–15.0)
MCH: 25.8 pg — ABNORMAL LOW (ref 26.0–34.0)
MCHC: 31.9 g/dL (ref 30.0–36.0)
MCV: 80.9 fL (ref 78.0–100.0)
PLATELETS: 323 10*3/uL (ref 150–400)
RBC: 4.03 MIL/uL (ref 3.87–5.11)
RDW: 14.3 % (ref 11.5–15.5)
WBC: 10.6 10*3/uL — AB (ref 4.0–10.5)

## 2017-08-09 LAB — HCG, QUANTITATIVE, PREGNANCY: HCG, BETA CHAIN, QUANT, S: 5365 m[IU]/mL — AB (ref <5)

## 2017-08-09 LAB — URINALYSIS, ROUTINE W REFLEX MICROSCOPIC
BACTERIA UA: NONE SEEN
Bilirubin Urine: NEGATIVE
Glucose, UA: NEGATIVE mg/dL
KETONES UR: NEGATIVE mg/dL
LEUKOCYTES UA: NEGATIVE
Nitrite: NEGATIVE
PROTEIN: NEGATIVE mg/dL
Specific Gravity, Urine: 1.015 (ref 1.005–1.030)
pH: 7 (ref 5.0–8.0)

## 2017-08-09 NOTE — MAU Note (Signed)
Pt seen in MAU previously for unknown pregnancy location, was to have U/S this week.  Pt continues to have lower abdominal pain.  Hurts up into ribs on L side.  Denies vaginal bleeding.

## 2017-08-09 NOTE — MAU Provider Note (Signed)
History     CSN: [id number]  Arrival date and time: 08/09/17 1719   None     Chief Complaint  Patient presents with  . Abdominal Pain   Pelvic Pain  The patient's primary symptoms include pelvic pain. This is a new problem. The current episode started 1 to 4 weeks ago. The problem occurs intermittently. The problem has been unchanged. Pain severity now: 6/10. The problem affects the left side. She is pregnant. Pertinent negatives include no chills, dysuria, fever, frequency, nausea, urgency or vomiting. The vaginal discharge was normal. There has been no bleeding. The symptoms are aggravated by activity. She has tried acetaminophen for the symptoms. The treatment provided no relief. She is sexually active. Menstrual history: LMP 05/01/18.     Past Medical History:  Diagnosis Date  . Acid reflux 2004  . Anemia   . Anxiety 2008  . Asthma 1993   no previous intubations or hospitalizations   . Chlamydia   . Depression 2008   no meds, currenly ok  . Environmental allergies   . Migraines   . Miscarriage   . Ovarian cyst   . Pre-diabetes   . Ulcer of the stomach and intestine 2004  . Urinary tract infection     Past Surgical History:  Procedure Laterality Date  . COLONOSCOPY  2004   . IRRIGATION AND DEBRIDEMENT SEBACEOUS CYST N/A 06/01/2017   Procedure: EXCISION SEBACEOUS CYST ON SCALP;  Surgeon: Ancil Linseyavis, Jason Evan, MD;  Location: [facility] ORS;  Service: General;  Laterality: N/A;  . MULTIPLE TOOTH EXTRACTIONS  2015   6 teeth removed   . WISDOM TOOTH EXTRACTION  2007    Family History  Problem Relation Age of Onset  . Asthma Mother   . Heart disease Mother   . Diabetes Mother   . Hypertension Mother   . Asthma Maternal Aunt   . Hypertension Maternal Aunt   . Diabetes Maternal Aunt   . Asthma Maternal Uncle   . Hypertension Maternal Uncle   . Diabetes Maternal Uncle   . Heart disease Maternal Grandmother   . Diabetes Maternal Grandmother   . Hypertension Maternal  Grandmother   . Heart disease Maternal Grandfather   . Diabetes Maternal Grandfather   . Hypertension Maternal Grandfather   . Hypertension Paternal Grandmother   . Hypotension Neg Hx   . Anesthesia problems Neg Hx   . Malignant hyperthermia Neg Hx   . Pseudochol deficiency Neg Hx   . Cancer Neg Hx     Social History   Tobacco Use  . Smoking status: Never Smoker  . Smokeless tobacco: Never Used  Substance Use Topics  . Alcohol use: No    Alcohol/week: 0.0 oz  . Drug use: No    Allergies:  Allergies  Allergen Reactions  . Other Anaphylaxis    "20 different types of trees"  . Latex Hives and Itching  . Zofran Itching    Medications Prior to Admission  Medication Sig Dispense Refill Last Dose  . acetaminophen (TYLENOL) 500 MG tablet Take 1,000 mg by mouth every 6 (six) hours as needed.   07/12/2017 at 0800  . albuterol (ACCUNEB) 0.63 MG/3ML nebulizer solution Inhale 1 ampule by nebulization every six (6) hours as needed for wheezing. 75 mL 5 Taking  . albuterol (PROVENTIL HFA;VENTOLIN HFA) 108 (90 Base) MCG/ACT inhaler Inhale 2 puffs into the lungs every 6 (six) hours as needed for wheezing or shortness of breath. 1 Inhaler 1 Taking  . ipratropium (ATROVENT) 0.06 %  nasal spray Place 2 sprays into both nostrils 4 (four) times daily. 15 mL 0   . metoCLOPramide (REGLAN) 10 MG tablet Take 1 tablet (10 mg total) by mouth 3 (three) times daily before meals. 60 tablet 5 Past Month at Unknown time  . Prenatal Multivit-Min-Fe-FA (PRENATAL VITAMINS) 0.8 MG tablet Take 1 tablet by mouth daily. 30 tablet 12 06/25/2017 at Unknown time  . promethazine (PHENERGAN) 25 MG tablet Take 1 tablet (25 mg total) by mouth every 4 (four) hours as needed for nausea or vomiting. 12 tablet 1 Past Week at Unknown time    Review of Systems  Constitutional: Negative for chills and fever.  Gastrointestinal: Negative for nausea and vomiting.  Genitourinary: Positive for pelvic pain. Negative for dysuria,  frequency, urgency and vaginal bleeding.   Physical Exam   Blood pressure 116/65, pulse 88, temperature 98.3 F (36.8 C), temperature source Oral, resp. rate 18, height 5\' 2"  (1.575 m), weight 116.2 kg (256 lb 1.9 oz), last menstrual period 05/01/2017, unknown if currently breastfeeding.  Physical Exam  Nursing note and vitals reviewed. Constitutional: She is oriented to person, place, and time. She appears well-developed and well-nourished. No distress.  HENT:  Head: Normocephalic.  Cardiovascular: Normal rate.  Respiratory: Effort normal.  GI: Soft. There is no tenderness. There is no rebound.  Neurological: She is alert and oriented to person, place, and time.  Skin: Skin is warm and dry.  Psychiatric: She has a normal mood and affect.   Results for orders placed or performed during the hospital encounter of 08/09/17 (from the past 24 hour(s))  Urinalysis, Routine w reflex microscopic     Status: Abnormal   Collection Time: 08/09/17  5:28 PM  Result Value Ref Range   Color, Urine YELLOW YELLOW   APPearance CLEAR CLEAR   Specific Gravity, Urine 1.015 1.005 - 1.030   pH 7.0 5.0 - 8.0   Glucose, UA NEGATIVE NEGATIVE mg/dL   Hgb urine dipstick SMALL (A) NEGATIVE   Bilirubin Urine NEGATIVE NEGATIVE   Ketones, ur NEGATIVE NEGATIVE mg/dL   Protein, ur NEGATIVE NEGATIVE mg/dL   Nitrite NEGATIVE NEGATIVE   Leukocytes, UA NEGATIVE NEGATIVE   RBC / HPF 0-5 0 - 5 RBC/hpf   WBC, UA 0-5 0 - 5 WBC/hpf   Bacteria, UA NONE SEEN NONE SEEN   Squamous Epithelial / LPF 0-5 (A) NONE SEEN  hCG, quantitative, pregnancy     Status: Abnormal   Collection Time: 08/09/17  5:52 PM  Result Value Ref Range   hCG, Beta Chain, Quant, S 5,365 (H) <5 mIU/mL  CBC     Status: Abnormal   Collection Time: 08/09/17  5:52 PM  Result Value Ref Range   WBC 10.6 (H) 4.0 - 10.5 K/uL   RBC 4.03 3.87 - 5.11 MIL/uL   Hemoglobin 10.4 (L) 12.0 - 15.0 g/dL   HCT 16.1 (L) 09.6 - 04.5 %   MCV 80.9 78.0 - 100.0 fL    MCH 25.8 (L) 26.0 - 34.0 pg   MCHC 31.9 30.0 - 36.0 g/dL   RDW 40.9 81.1 - 91.4 %   Platelets 323 150 - 400 K/uL   US Ob Transvaginal  Result Date: 08/09/2017 CLINICAL DATA:  Initial evaluation for left-sided abdominal pain for 1 week. EXAM: OBSTETRIC <14 WK [place] AND TRANSVAGINAL OB US TECHNIQUE: Both transabdominal and transvaginal ultrasound examinations were performed for complete evaluation of the gestation as well as the maternal uterus, adnexal regions, and pelvic cul-de-sac. Transvaginal technique was performed  to assess early pregnancy. COMPARISON:  Prior ultrasound from 08/01/2017. FINDINGS: Intrauterine gestational sac: Single Yolk sac:  Present Embryo:  Not visualized Cardiac Activity: N/A Heart Rate: N/A  bpm MSD: 6.9 mm   5 w   2 d Subchorionic hemorrhage:  None visualized. Maternal uterus/adnexae: 2.4 x 2.3 x 2 cm simple right ovarian cyst noted, likely a normal physiologic cyst/dominant follicle. Ovaries otherwise unremarkable. Trace free fluid noted within the pelvis. IMPRESSION: 1. Single intrauterine gestational sac with internal yolk sac, but no embryo yet identified. Estimated gestational age [redacted] weeks and 2 days by mean sac diameter. Suggest serial beta HCG with close interval repeat ultrasound as indicated to assess viability. 2. No other acute maternal uterine or adnexal abnormality identified. Electronically Signed   By: Rise Mu M.D.   On: 08/09/2017 20:23   MAU Course  Procedures  MDM   Assessment and Plan   1. Pelvic pain in pregnancy, antepartum, first trimester   2. Intrauterine pregnancy   3. [redacted] weeks gestation of pregnancy    DC home Comfort measures reviewed  1st Trimester precautions  RX: none  Return to MAU as needed FU with OB as planned  Follow-up Information    Department, [facility] Follow up.   Contact information: [address] [place] [address] [phone]            Thressa Sheller 08/09/2017, 8:14 PM

## 2017-08-09 NOTE — Discharge Instructions (Signed)
Prenatal Care Providers °[facility]    [facility] ° Phone- [phone]     Phone: [phone] °         °[facility]                      [facility] ° @[place]     Phone: [phone] ° Phone: [phone] °        [facility] °[facility]     Phone: [phone] ° Phone: [phone]   °        [facility] °[facility] [place]                hone: [phone] ° Phone: [phone] °        [facility] °Dr. Bernard Marshall      Phone: [phone] ° Phone: [phone] °        [facility] °[facility]                Phone: [phone] ° Women’s [facility]  ° Phone:[phone]    [facility] ([place]) °         Phone: [phone] °[facility] °  Phone: [phone] °Safe Medications in Pregnancy  ° °Acne: °Benzoyl Peroxide °Salicylic Acid ° °Backache/Headache: °Tylenol: 2 regular strength every 4 hours OR °             2 Extra strength every 6 hours ° °Colds/Coughs/Allergies: °Benadryl (alcohol free) 25 mg every 6 hours as needed °Breath right strips °Claritin °Cepacol throat lozenges °Chloraseptic throat spray °Cold-Eeze- up to three times per day °Cough drops, alcohol free °Flonase (by prescription only) °Guaifenesin °Mucinex °Robitussin DM (plain only, alcohol free) °Saline nasal spray/drops °Sudafed (pseudoephedrine) & Actifed ** use only after [redacted] weeks gestation and if you do not have high blood pressure °Tylenol °Vicks Vaporub °Zinc lozenges °Zyrtec  ° °Constipation: °Colace °Ducolax suppositories °Fleet enema °Glycerin suppositories °Metamucil °Milk of magnesia °Miralax °Senokot °Smooth move tea ° °Diarrhea: °Kaopectate °Imodium A-D ° °*NO pepto Bismol ° °Hemorrhoids: °Anusol °Anusol HC °Preparation H °Tucks ° °Indigestion: °Tums °Maalox °Mylanta °Zantac  °Pepcid ° °Insomnia: °Benadryl (alcohol free) 25mg every 6 hours as needed °Tylenol  PM °Unisom, no Gelcaps ° °Leg Cramps: °Tums °MagGel ° °Nausea/Vomiting:  °Bonine °Dramamine °Emetrol °Ginger extract °Sea bands °Meclizine  °Nausea medication to take during pregnancy:  °Unisom (doxylamine succinate 25 mg tablets) Take one tablet daily at bedtime. If symptoms are not adequately controlled, the dose can be increased to a maximum recommended dose of two tablets daily (1/2 tablet in the morning, 1/2 tablet mid-afternoon and one at bedtime). °Vitamin B6 100mg tablets. Take one tablet twice a day (up to 200 mg per day). ° °Skin Rashes: °Aveeno products °Benadryl cream or 25mg every 6 hours as needed °Calamine Lotion °1% cortisone cream ° °Yeast infection: °Gyne-lotrimin 7 °Monistat 7 ° ° °**If taking multiple medications, please check labels to avoid duplicating the same active ingredients °**take medication as directed on the label °** Do not exceed 4000 mg of tylenol in 24 hours °**Do not take medications that contain aspirin or ibuprofen ° ° ° ° °

## 2017-08-19 ENCOUNTER — Other Ambulatory Visit: Payer: Self-pay

## 2017-08-19 ENCOUNTER — Ambulatory Visit (HOSPITAL_COMMUNITY): Payer: BLUE CROSS/BLUE SHIELD | Attending: Cardiovascular Disease

## 2017-08-19 DIAGNOSIS — I361 Nonrheumatic tricuspid (valve) insufficiency: Secondary | ICD-10-CM | POA: Diagnosis not present

## 2017-08-19 DIAGNOSIS — R0602 Shortness of breath: Secondary | ICD-10-CM | POA: Insufficient documentation

## 2017-08-25 ENCOUNTER — Inpatient Hospital Stay (HOSPITAL_COMMUNITY)
Admission: AD | Admit: 2017-08-25 | Discharge: 2017-08-25 | Disposition: A | Discharge status: Home / Self Care | Payer: Medicaid Other | Source: Ambulatory Visit | Attending: Obstetrics and Gynecology | Admitting: Obstetrics and Gynecology

## 2017-08-25 ENCOUNTER — Encounter (HOSPITAL_COMMUNITY): Payer: Self-pay | Admitting: *Deleted

## 2017-08-25 DIAGNOSIS — Z3A01 Less than 8 weeks gestation of pregnancy: Secondary | ICD-10-CM

## 2017-08-25 DIAGNOSIS — O219 Vomiting of pregnancy, unspecified: Secondary | ICD-10-CM

## 2017-08-25 DIAGNOSIS — O210 Mild hyperemesis gravidarum: Secondary | ICD-10-CM | POA: Insufficient documentation

## 2017-08-25 LAB — URINALYSIS, ROUTINE W REFLEX MICROSCOPIC
BILIRUBIN URINE: NEGATIVE
Glucose, UA: NEGATIVE mg/dL
Hgb urine dipstick: NEGATIVE
Ketones, ur: NEGATIVE mg/dL
Nitrite: NEGATIVE
PROTEIN: NEGATIVE mg/dL
Specific Gravity, Urine: 1.024 (ref 1.005–1.030)
pH: 7 (ref 5.0–8.0)

## 2017-08-25 MED ORDER — PROCHLORPERAZINE MALEATE 10 MG PO TABS: 10.0000 mg | ORAL_TABLET | Freq: Two times a day (BID) | ORAL | 1 refills | Status: DC | PRN

## 2017-08-25 MED ORDER — RANITIDINE HCL 150 MG PO TABS: 150.0000 mg | ORAL_TABLET | Freq: Two times a day (BID) | ORAL | 0 refills | Status: DC

## 2017-08-25 NOTE — MAU Note (Signed)
Pt has had n/v for the past week. C/O abd pain and cramping. Feels light headed this morning as well. Pt has rx for reglan and Promethazine. took reglan this morning  stated not helping .

## 2017-08-25 NOTE — MAU Provider Note (Signed)
History     CSN: [id number]  Arrival date and time: 08/25/17 1036   First Provider Initiated Contact with Patient 08/25/17 1149     Chief Complaint  Patient presents with  . Emesis  . Nausea   HPI Laurie Reed is a 32 y.o. [id number] at [id number] who presents with ongoing nausea and vomiting. She states she throws up everything she eats or drinks. She takes phenergan and reglan at home but states it does not work and reports an allergy to zofran. She states all of the vomiting is causing pain in her upper abdomen. She denies vaginal bleeding or discharge.   OB History    Gravida  7   Para  3   Term  3   Preterm  0   AB  3   Living  3     SAB  3   TAB  0   Ectopic  0   Multiple  0   Live Births  3           Past Medical History:  Diagnosis Date  . Acid reflux 2004  . Anemia   . Anxiety 2008  . Asthma 1993   no previous intubations or hospitalizations   . Chlamydia   . Depression 2008   no meds, currenly ok  . Environmental allergies   . Migraines   . Miscarriage   . Ovarian cyst   . Pre-diabetes   . Ulcer of the stomach and intestine 2004  . Urinary tract infection     Past Surgical History:  Procedure Laterality Date  . COLONOSCOPY  2004   . IRRIGATION AND DEBRIDEMENT SEBACEOUS CYST N/A 06/01/2017   Procedure: EXCISION SEBACEOUS CYST ON SCALP;  Surgeon: Ancil Linseyavis, Jason Evan, MD;  Location: [facility] ORS;  Service: General;  Laterality: N/A;  . MULTIPLE TOOTH EXTRACTIONS  2015   6 teeth removed   . WISDOM TOOTH EXTRACTION  2007    Family History  Problem Relation Age of Onset  . Asthma Mother   . Heart disease Mother   . Hypertension Mother   . Asthma Maternal Aunt   . Hypertension Maternal Aunt   . Diabetes Maternal Aunt   . Asthma Maternal Uncle   . Hypertension Maternal Uncle   . Diabetes Maternal Uncle   . Heart disease Maternal Grandmother   . Diabetes Maternal Grandmother   . Hypertension Maternal Grandmother   . Heart disease Maternal  Grandfather   . Diabetes Maternal Grandfather   . Hypertension Maternal Grandfather   . Hypertension Paternal Grandmother   . Hypotension Neg Hx   . Anesthesia problems Neg Hx   . Malignant hyperthermia Neg Hx   . Pseudochol deficiency Neg Hx   . Cancer Neg Hx     Social History   Tobacco Use  . Smoking status: Never Smoker  . Smokeless tobacco: Never Used  Substance Use Topics  . Alcohol use: No    Alcohol/week: 0.0 oz  . Drug use: No    Allergies:  Allergies  Allergen Reactions  . Other Anaphylaxis    "20 different types of trees"  . Latex Hives and Itching  . Zofran Itching    Medications Prior to Admission  Medication Sig Dispense Refill Last Dose  . acetaminophen (TYLENOL) 500 MG tablet Take 1,000 mg by mouth every 6 (six) hours as needed.   Past Week at Unknown time  . albuterol (ACCUNEB) 0.63 MG/3ML nebulizer solution Inhale 1 ampule by nebulization every six (6) hours  as needed for wheezing. 75 mL 5 prn  . albuterol (PROVENTIL HFA;VENTOLIN HFA) 108 (90 Base) MCG/ACT inhaler Inhale 2 puffs into the lungs every 6 (six) hours as needed for wheezing or shortness of breath. 1 Inhaler 1 prn  . metoCLOPramide (REGLAN) 10 MG tablet Take 1 tablet (10 mg total) by mouth 3 (three) times daily before meals. 60 tablet 5 08/25/2017 at Unknown time  . Prenatal Multivit-Min-Fe-FA (PRENATAL VITAMINS) 0.8 MG tablet Take 1 tablet by mouth daily. 30 tablet 12 Past Week at Unknown time  . promethazine (PHENERGAN) 25 MG tablet Take 1 tablet (25 mg total) by mouth every 4 (four) hours as needed for nausea or vomiting. 12 tablet 1 08/24/2017 at Unknown time  . ipratropium (ATROVENT) 0.06 % nasal spray Place 2 sprays into both nostrils 4 (four) times daily. 15 mL 0     Review of Systems  Constitutional: Negative.  Negative for fatigue and fever.  HENT: Negative.   Respiratory: Negative.  Negative for shortness of breath.   Cardiovascular: Negative.  Negative for chest pain.   Gastrointestinal: Positive for abdominal pain, nausea and vomiting. Negative for constipation and diarrhea.  Genitourinary: Negative.  Negative for dysuria, vaginal bleeding and vaginal discharge.  Neurological: Negative.  Negative for dizziness and headaches.   Physical Exam   Blood pressure (!) 101/58, pulse 84, temperature 98.4 F (36.9 C), resp. rate 18, height 5\' 2"  (1.575 m), weight 253 lb (114.8 kg), last menstrual period 05/01/2017, unknown if currently breastfeeding.  Physical Exam  Nursing note and vitals reviewed. Constitutional: She is oriented to person, place, and time. She appears well-developed and well-nourished. No distress.  HENT:  Head: Normocephalic.  Eyes: Pupils are equal, round, and reactive to light.  Cardiovascular: Normal rate, regular rhythm and normal heart sounds.  Respiratory: Effort normal and breath sounds normal. No respiratory distress.  GI: Soft. Bowel sounds are normal. She exhibits no distension. There is no tenderness.  Neurological: She is alert and oriented to person, place, and time.  Skin: Skin is warm and dry.  Psychiatric: She has a normal mood and affect. Her behavior is normal. Judgment and thought content normal.    MAU Course  Procedures Results for orders placed or performed during the hospital encounter of 08/25/17 (from the past 24 hour(s))  Urinalysis, Routine w reflex microscopic     Status: Abnormal   Collection Time: 08/25/17 11:02 AM  Result Value Ref Range   Color, Urine YELLOW YELLOW   APPearance CLEAR CLEAR   Specific Gravity, Urine 1.024 1.005 - 1.030   pH 7.0 5.0 - 8.0   Glucose, UA NEGATIVE NEGATIVE mg/dL   Hgb urine dipstick NEGATIVE NEGATIVE   Bilirubin Urine NEGATIVE NEGATIVE   Ketones, ur NEGATIVE NEGATIVE mg/dL   Protein, ur NEGATIVE NEGATIVE mg/dL   Nitrite NEGATIVE NEGATIVE   Leukocytes, UA TRACE (A) NEGATIVE   RBC / HPF 0-5 0 - 5 RBC/hpf   WBC, UA 0-5 0 - 5 WBC/hpf   Bacteria, UA RARE (A) NONE SEEN    Squamous Epithelial / LPF 6-30 (A) NONE SEEN   Mucus PRESENT      MDM UA No episodes of vomiting while in MAU. Patient driving herself and unable to take sedating medication while in MAU.  Assessment and Plan   1. Nausea and vomiting during pregnancy prior to [redacted] weeks gestation   2. [redacted] weeks gestation of pregnancy    -Discharge home in stable condition -Rx for compazine and zantac sent to patient's  pharmacy -BRAT diet and medication precautions discussed -Patient advised to follow-up with [facility] as scheduled for prenatal care -Patient may return to MAU as needed or if her condition were to change or worsen  Rolm Bookbinder CNM 08/25/2017, 11:50 AM

## 2017-08-25 NOTE — Discharge Instructions (Signed)
Morning Sickness °Morning sickness is when you feel sick to your stomach (nauseous) during pregnancy. This nauseous feeling may or may not come with vomiting. It often occurs in the morning but can be a problem any time of day. Morning sickness is most common during the first trimester, but it may continue throughout pregnancy. While morning sickness is unpleasant, it is usually harmless unless you develop severe and continual vomiting (hyperemesis gravidarum). This condition requires more intense treatment. °What are the causes? °The cause of morning sickness is not completely known but seems to be related to normal hormonal changes that occur in pregnancy. °What increases the risk? °You are at greater risk if you: °· Experienced nausea or vomiting before your pregnancy. °· Had morning sickness during a previous pregnancy. °· Are pregnant with more than one baby, such as twins. ° °How is this treated? °Do not use any medicines (prescription, over-the-counter, or herbal) for morning sickness without first talking to your health care provider. Your health care provider may prescribe or recommend: °· Vitamin B6 supplements. °· Anti-nausea medicines. °· The herbal medicine ginger. ° °Follow these instructions at home: °· Only take over-the-counter or prescription medicines as directed by your health care provider. °· Taking multivitamins before getting pregnant can prevent or decrease the severity of morning sickness in most women. °· Eat a piece of dry toast or unsalted crackers before getting out of bed in the morning. °· Eat five or six small meals a day. °· Eat dry and bland foods (rice, baked potato). Foods high in carbohydrates are often helpful. °· Do not drink liquids with your meals. Drink liquids between meals. °· Avoid greasy, fatty, and spicy foods. °· Get someone to cook for you if the smell of any food causes nausea and vomiting. °· If you feel nauseous after taking prenatal vitamins, take the vitamins at  night or with a snack. °· Snack on protein foods (nuts, yogurt, cheese) between meals if you are hungry. °· Eat unsweetened gelatins for desserts. °· Wearing an acupressure wristband (worn for sea sickness) may be helpful. °· Acupuncture may be helpful. °· Do not smoke. °· Get a humidifier to keep the air in your house free of odors. °· Get plenty of fresh air. °Contact a health care provider if: °· Your home remedies are not working, and you need medicine. °· You feel dizzy or lightheaded. °· You are losing weight. °Get help right away if: °· You have persistent and uncontrolled nausea and vomiting. °· You pass out (faint). °This information is not intended to replace advice given to you by your health care provider. Make sure you discuss any questions you have with your health care provider. °Document Released: 06/24/2006 Document Revised: 10/09/2015 Document Reviewed: 10/18/2012 °Elsevier Interactive Patient Education © 2017 Elsevier Inc. ° °

## 2017-09-01 ENCOUNTER — Inpatient Hospital Stay (HOSPITAL_COMMUNITY): Payer: BLUE CROSS/BLUE SHIELD

## 2017-09-01 ENCOUNTER — Encounter (HOSPITAL_COMMUNITY): Payer: Self-pay | Admitting: *Deleted

## 2017-09-01 ENCOUNTER — Inpatient Hospital Stay (HOSPITAL_COMMUNITY)
Admission: AD | Admit: 2017-09-01 | Discharge: 2017-09-01 | Disposition: A | Discharge status: Home / Self Care | Payer: BLUE CROSS/BLUE SHIELD | Source: Ambulatory Visit | Attending: Family Medicine | Admitting: Family Medicine

## 2017-09-01 DIAGNOSIS — O209 Hemorrhage in early pregnancy, unspecified: Secondary | ICD-10-CM | POA: Diagnosis present

## 2017-09-01 DIAGNOSIS — O99891 Other specified diseases and conditions complicating pregnancy: Secondary | ICD-10-CM

## 2017-09-01 DIAGNOSIS — O9989 Other specified diseases and conditions complicating pregnancy, childbirth and the puerperium: Secondary | ICD-10-CM

## 2017-09-01 DIAGNOSIS — O208 Other hemorrhage in early pregnancy: Secondary | ICD-10-CM | POA: Diagnosis not present

## 2017-09-01 DIAGNOSIS — O468X1 Other antepartum hemorrhage, first trimester: Secondary | ICD-10-CM | POA: Diagnosis not present

## 2017-09-01 DIAGNOSIS — M549 Dorsalgia, unspecified: Secondary | ICD-10-CM

## 2017-09-01 DIAGNOSIS — O418X10 Other specified disorders of amniotic fluid and membranes, first trimester, not applicable or unspecified: Secondary | ICD-10-CM | POA: Diagnosis not present

## 2017-09-01 DIAGNOSIS — Z3A08 8 weeks gestation of pregnancy: Secondary | ICD-10-CM | POA: Insufficient documentation

## 2017-09-01 LAB — URINALYSIS, ROUTINE W REFLEX MICROSCOPIC
Bilirubin Urine: NEGATIVE
GLUCOSE, UA: NEGATIVE mg/dL
KETONES UR: NEGATIVE mg/dL
LEUKOCYTES UA: NEGATIVE
Nitrite: NEGATIVE
PH: 7 (ref 5.0–8.0)
Protein, ur: NEGATIVE mg/dL
SPECIFIC GRAVITY, URINE: 1.021 (ref 1.005–1.030)

## 2017-09-01 LAB — WET PREP, GENITAL
Clue Cells Wet Prep HPF POC: NONE SEEN
SPERM: NONE SEEN
Trich, Wet Prep: NONE SEEN
Yeast Wet Prep HPF POC: NONE SEEN

## 2017-09-01 MED ORDER — CYCLOBENZAPRINE HCL 10 MG PO TABS: 10.0000 mg | ORAL_TABLET | Freq: Two times a day (BID) | ORAL | 0 refills | Status: DC | PRN

## 2017-09-01 MED ORDER — CYCLOBENZAPRINE HCL 10 MG PO TABS
10.0000 mg | ORAL_TABLET | Freq: Once | ORAL | Status: AC
  Administered 2017-09-01: 10 mg via ORAL
  Filled 2017-09-01: qty 1

## 2017-09-01 MED ORDER — ACETAMINOPHEN 500 MG PO TABS
1000.0000 mg | ORAL_TABLET | Freq: Four times a day (QID) | ORAL | Status: DC | PRN
  Administered 2017-09-01: 1000 mg via ORAL
  Filled 2017-09-01: qty 2

## 2017-09-01 NOTE — Discharge Instructions (Signed)
Back Pain in Pregnancy Back pain during pregnancy is common. Back pain may be caused by several factors that are related to changes during your pregnancy. Follow these instructions at home: Managing pain, stiffness, and swelling  If directed, apply ice for sudden (acute) back pain. ? Put ice in a plastic bag. ? Place a towel between your skin and the bag. ? Leave the ice on for 20 minutes, 2-3 times per day.  If directed, apply heat to the affected area before you exercise: ? Place a towel between your skin and the heat pack or heating pad. ? Leave the heat on for 20-30 minutes. ? Remove the heat if your skin turns bright red. This is especially important if you are unable to feel pain, heat, or cold. You may have a greater risk of getting burned. Activity  Exercise as told by your health care provider. Exercising is the best way to prevent or manage back pain.  Listen to your body when lifting. If lifting hurts, ask for help or bend your knees. This uses your leg muscles instead of your back muscles.  Squat down when picking up something from the floor. Do not bend over.  Only use bed rest as told by your health care provider. Bed rest should only be used for the most severe episodes of back pain. Standing, Sitting, and Lying Down  Do not stand in one place for long periods of time.  Use good posture when sitting. Make sure your head rests over your shoulders and is not hanging forward. Use a pillow on your lower back if necessary.  Try sleeping on your side, preferably the left side, with a pillow or two between your legs. If you are sore after a night's rest, your bed may be too soft. A firm mattress may provide more support for your back during pregnancy. General instructions  Do not wear high heels.  Eat a healthy diet. Try to gain weight within your health care provider's recommendations.  Use a maternity girdle, elastic sling, or back brace as told by your health care  provider.  Take over-the-counter and prescription medicines only as told by your health care provider.  Keep all follow-up visits as told by your health care provider. This is important. This includes any visits with any specialists, such as a physical therapist. Contact a health care provider if:  Your back pain interferes with your daily activities.  You have increasing pain in other parts of your body. Get help right away if:  You develop numbness, tingling, weakness, or problems with the use of your arms or legs.  You develop severe back pain that is not controlled with medicine.  You have a sudden change in bowel or bladder control.  You develop shortness of breath, dizziness, or you faint.  You develop nausea, vomiting, or sweating.  You have back pain that is a rhythmic, cramping pain similar to labor pains. Labor pain is usually 1-2 minutes apart, lasts for about 1 minute, and involves a bearing down feeling or pressure in your pelvis.  You have back pain and your water breaks or you have vaginal bleeding.  You have back pain or numbness that travels down your leg.  Your back pain developed after you fell.  You develop pain on one side of your back.  You see blood in your urine.  You develop skin blisters in the area of your back pain. This information is not intended to replace advice given to you   by your health care provider. Make sure you discuss any questions you have with your health care provider. Document Released: 08/11/2005 Document Revised: 10/09/2015 Document Reviewed: 01/15/2015 Elsevier Interactive Patient Education  2018 Elsevier Inc.   Pelvic Rest Pelvic rest may be recommended if:  Your placenta is partially or completely covering the opening of your cervix (placenta previa).  There is bleeding between the wall of the uterus and the amniotic sac in the first trimester of pregnancy (subchorionic hemorrhage).  You went into labor too early  (preterm labor).  Based on your overall health and the health of your baby, your health care provider will decide if pelvic rest is right for you. How do I rest my pelvis? For as long as told by your health care provider:  Do not have sex, sexual stimulation, or an orgasm.  Do not use tampons. Do not douche. Do not put anything in your vagina.  Do not lift anything that is heavier than 10 lb (4.5 kg).  Avoid activities that take a lot of effort (are strenuous).  Avoid any activity in which your pelvic muscles could become strained.  When should I seek medical care? Seek medical care if you have:  Cramping pain in your lower abdomen.  Vaginal discharge.  A low, dull backache.  Regular contractions.  Uterine tightening.  When should I seek immediate medical care? Seek immediate medical care if:  You have vaginal bleeding and you are pregnant.  This information is not intended to replace advice given to you by your health care provider. Make sure you discuss any questions you have with your health care provider. Document Released: 08/28/2010 Document Revised: 10/09/2015 Document Reviewed: 11/04/2014 Elsevier Interactive Patient Education  [employer]2018 [employer] Inc.

## 2017-09-01 NOTE — MAU Note (Signed)
Patient was woken up with abdominal pain and bleeding this AM at 0330.  Took extra strength Tylenol, but it isn't helping.  Pt states she had a miscarriage in February 2019 and was given an EDC of 04/09/18 for this pregnancy based on early US.  Her last known LMP was 05/01/17.

## 2017-09-01 NOTE — MAU Provider Note (Signed)
History     CSN: [id number]  Arrival date and time: 09/01/17 1115   None     Chief Complaint  Patient presents with  . Abdominal Pain  . Vaginal Bleeding  . Back Pain   HPI 32 yo [id number] at [redacted]w[redacted]d presenting today for the evaluation of back pain, lower abdominal cramping and vaginal bleeding. She reports these symptoms started earlier this morning around 3:30 am. She describes the back pain as a sharp back pain located in her mid back. She denies any extraneous physical work or exercise. She reports some vaginal bleeding noted when wiping.  OB History    Gravida  7   Para  3   Term  3   Preterm  0   AB  3   Living  3     SAB  3   TAB  0   Ectopic  0   Multiple  0   Live Births  3           Past Medical History:  Diagnosis Date  . Acid reflux 2004  . Anemia   . Anxiety 2008  . Asthma 1993   no previous intubations or hospitalizations   . Chlamydia   . Depression 2008   no meds, currenly ok  . Environmental allergies   . Migraines   . Miscarriage   . Ovarian cyst   . Pre-diabetes   . Ulcer of the stomach and intestine 2004  . Urinary tract infection     Past Surgical History:  Procedure Laterality Date  . COLONOSCOPY  2004   . IRRIGATION AND DEBRIDEMENT SEBACEOUS CYST N/A 06/01/2017   Procedure: EXCISION SEBACEOUS CYST ON SCALP;  Surgeon: Ancil Linsey, MD;  Location: [facility] ORS;  Service: General;  Laterality: N/A;  . MULTIPLE TOOTH EXTRACTIONS  2015   6 teeth removed   . WISDOM TOOTH EXTRACTION  2007    Family History  Problem Relation Age of Onset  . Asthma Mother   . Heart disease Mother   . Hypertension Mother   . Asthma Maternal Aunt   . Hypertension Maternal Aunt   . Diabetes Maternal Aunt   . Asthma Maternal Uncle   . Hypertension Maternal Uncle   . Diabetes Maternal Uncle   . Heart disease Maternal Grandmother   . Diabetes Maternal Grandmother   . Hypertension Maternal Grandmother   . Heart disease Maternal Grandfather    . Diabetes Maternal Grandfather   . Hypertension Maternal Grandfather   . Hypertension Paternal Grandmother   . Hypotension Neg Hx   . Anesthesia problems Neg Hx   . Malignant hyperthermia Neg Hx   . Pseudochol deficiency Neg Hx   . Cancer Neg Hx     Social History   Tobacco Use  . Smoking status: Never Smoker  . Smokeless tobacco: Never Used  Substance Use Topics  . Alcohol use: No    Alcohol/week: 0.0 oz  . Drug use: No    Allergies:  Allergies  Allergen Reactions  . Other Anaphylaxis    "20 different types of trees"  . Latex Hives and Itching  . Zofran Itching    Medications Prior to Admission  Medication Sig Dispense Refill Last Dose  . acetaminophen (TYLENOL) 500 MG tablet Take 1,000 mg by mouth every 6 (six) hours as needed.   Past Week at Unknown time  . albuterol (ACCUNEB) 0.63 MG/3ML nebulizer solution Inhale 1 ampule by nebulization every six (6) hours as needed for wheezing. 75  mL 5 prn  . albuterol (PROVENTIL HFA;VENTOLIN HFA) 108 (90 Base) MCG/ACT inhaler Inhale 2 puffs into the lungs every 6 (six) hours as needed for wheezing or shortness of breath. 1 Inhaler 1 prn  . ipratropium (ATROVENT) 0.06 % nasal spray Place 2 sprays into both nostrils 4 (four) times daily. 15 mL 0   . metoCLOPramide (REGLAN) 10 MG tablet Take 1 tablet (10 mg total) by mouth 3 (three) times daily before meals. 60 tablet 5 08/25/2017 at Unknown time  . Prenatal Multivit-Min-Fe-FA (PRENATAL VITAMINS) 0.8 MG tablet Take 1 tablet by mouth daily. 30 tablet 12 Past Week at Unknown time  . prochlorperazine (COMPAZINE) 10 MG tablet Take 1 tablet (10 mg total) by mouth 2 (two) times daily as needed for nausea or vomiting. 30 tablet 1   . promethazine (PHENERGAN) 25 MG tablet Take 1 tablet (25 mg total) by mouth every 4 (four) hours as needed for nausea or vomiting. 12 tablet 1 08/24/2017 at Unknown time  . ranitidine (ZANTAC) 150 MG tablet Take 1 tablet (150 mg total) by mouth 2 (two) times daily.  60 tablet 0     Review of Systems  See pertinent in HPI Physical Exam   Blood pressure 120/69, pulse 100, temperature 98 F (36.7 C), temperature source Oral, weight 252 lb 4 oz (114.4 kg), last menstrual period 05/01/2017, SpO2 96 %, unknown if currently breastfeeding.  Physical Exam GENERAL: Well-developed, well-nourished female in no acute distress.  ABDOMEN: Soft, nontender, nondistended. No organomegaly. PELVIC: Normal external female genitalia. Vagina is pink and rugated.  Normal discharge. Normal appearing cervix. Uterus is normal in size. No adnexal mass or tenderness. EXTREMITIES: No cyanosis, clubbing, or edema, 2+ distal pulses.  MAU Course  Procedures  MDM Flexeril given for back pain with some improvement  US Ob Transvaginal  Result Date: 09/01/2017 CLINICAL DATA:  32 year old pregnant female with pelvic pain and vaginal bleeding. EXAM: TRANSVAGINAL OB ULTRASOUND TECHNIQUE: Transvaginal ultrasound was performed for complete evaluation of the gestation as well as the maternal uterus, adnexal regions, and pelvic cul-de-sac. COMPARISON:  None. FINDINGS: Intrauterine gestational sac: Single Yolk sac:  Visualized. Embryo:  Visualized. Cardiac Activity: Visualized. Heart Rate: 174 bpm CRL:   18.9 mm   8 w 2 d                  [place] EDC: 04/11/2018 Subchorionic hemorrhage:  Small Maternal uterus/adnexae: The ovaries are unremarkable. No adnexal mass or free fluid. IMPRESSION: Single living intrauterine gestation with estimated gestational age of [redacted] weeks 2 days by this ultrasound. Small subchorionic hemorrhage. Electronically Signed   By: Harmon Pier M.D.   On: 09/01/2017 12:41   Results for orders placed or performed during the hospital encounter of 09/01/17 (from the past 24 hour(s))  Urinalysis, Routine w reflex microscopic     Status: Abnormal   Collection Time: 09/01/17 11:24 AM  Result Value Ref Range   Color, Urine YELLOW YELLOW   APPearance HAZY (A) CLEAR   Specific Gravity,  Urine 1.021 1.005 - 1.030   pH 7.0 5.0 - 8.0   Glucose, UA NEGATIVE NEGATIVE mg/dL   Hgb urine dipstick SMALL (A) NEGATIVE   Bilirubin Urine NEGATIVE NEGATIVE   Ketones, ur NEGATIVE NEGATIVE mg/dL   Protein, ur NEGATIVE NEGATIVE mg/dL   Nitrite NEGATIVE NEGATIVE   Leukocytes, UA NEGATIVE NEGATIVE   RBC / HPF 0-5 0 - 5 RBC/hpf   WBC, UA 0-5 0 - 5 WBC/hpf   Bacteria, UA RARE (A)  NONE SEEN   Squamous Epithelial / LPF 6-30 (A) NONE SEEN   Mucus PRESENT   Wet prep, genital     Status: Abnormal   Collection Time: 09/01/17 11:36 AM  Result Value Ref Range   Yeast Wet Prep HPF POC NONE SEEN NONE SEEN   Trich, Wet Prep NONE SEEN NONE SEEN   Clue Cells Wet Prep HPF POC NONE SEEN NONE SEEN   WBC, Wet Prep HPF POC FEW (A) NONE SEEN   Sperm NONE SEEN     Assessment and Plan  32 yo [id number] with small subchorionic hemorrhage and back pain in pregnancy - Ultrasound results reviewed with the patient - Advised to stay active and stretch to aid with back pain- Rx flexeril provided - Follow up as scheduled to initiate prenatal care on 5/1 - Precautions reviewed  Rambo Sarafian 09/01/2017, 1:09 PM

## 2017-09-14 ENCOUNTER — Encounter: Payer: Self-pay | Admitting: Obstetrics and Gynecology

## 2017-09-14 ENCOUNTER — Ambulatory Visit (INDEPENDENT_AMBULATORY_CARE_PROVIDER_SITE_OTHER): Payer: Medicaid Other | Admitting: Obstetrics and Gynecology

## 2017-09-14 DIAGNOSIS — Z8632 Personal history of gestational diabetes: Secondary | ICD-10-CM

## 2017-09-14 DIAGNOSIS — Z3481 Encounter for supervision of other normal pregnancy, first trimester: Secondary | ICD-10-CM

## 2017-09-14 DIAGNOSIS — O09291 Supervision of pregnancy with other poor reproductive or obstetric history, first trimester: Secondary | ICD-10-CM

## 2017-09-14 DIAGNOSIS — O09299 Supervision of pregnancy with other poor reproductive or obstetric history, unspecified trimester: Secondary | ICD-10-CM

## 2017-09-14 DIAGNOSIS — Z3480 Encounter for supervision of other normal pregnancy, unspecified trimester: Secondary | ICD-10-CM

## 2017-09-14 NOTE — Progress Notes (Signed)
FHR obtained via bedside ultrasound. FHR 162 bpm

## 2017-09-14 NOTE — Progress Notes (Signed)
INITIAL PRENATAL VISIT NOTE  Subjective:  Laurie Reed is a 32 y.o. [id number] at [redacted]w[redacted]d by early [place] being seen today for her initial prenatal visit. This is a unplanned pregnancy. She and partner are happy with the pregnancy. She was using nothing for birth control previously, has used depo in past (had trouble keeping appointments, had nexplanon but did not love it. She has an obstetric history significant for 3 x svd. She has a medical history significant for h/o gestational DM x1.  Patient reports occasional back pain and sharp pain in left lower quadrant.  Contractions: Not present. Vag. Bleeding: None.  Movement: Absent. Denies leaking of fluid.   Pap: 10/2016 normal  Past Medical History:  Diagnosis Date  . Acid reflux 2004  . Anemia   . Anxiety 2008  . Asthma 1993   no previous intubations or hospitalizations   . Chlamydia   . Depression 2008   no meds, currenly ok  . Environmental allergies   . Migraines   . Miscarriage   . Ovarian cyst   . Pre-diabetes   . Ulcer of the stomach and intestine 2004  . Urinary tract infection     Past Surgical History:  Procedure Laterality Date  . COLONOSCOPY  2004   . IRRIGATION AND DEBRIDEMENT SEBACEOUS CYST N/A 06/01/2017   Procedure: EXCISION SEBACEOUS CYST ON SCALP;  Surgeon: Ancil Linsey, MD;  Location: [facility] ORS;  Service: General;  Laterality: N/A;  . MULTIPLE TOOTH EXTRACTIONS  2015   6 teeth removed   . WISDOM TOOTH EXTRACTION  2007    OB History  Gravida Para Term Preterm AB Living  0 3 3  SAB TAB Ectopic Multiple Live Births  3 0 0 0 3    # Outcome Date GA Lbr Len/2nd Weight Sex Delivery Anes PTL Lv  7 Current           6 SAB 06/26/17 [redacted]w[redacted]d         5 Term 04/10/13 [redacted]w[redacted]d 02:46 / 01:38 7 lb 10.4 oz (3.47 kg) F Vag-Spont EPI  LIV     Birth Comments: WNL  4 SAB 2013          3 Term 2011 [redacted]w[redacted]d 10:00 7 lb 14 oz (3.572 kg) F Vag-Spont EPI  LIV  2 Term 2010 [redacted]w[redacted]d  6 lb 5 oz (2.863 kg) M Vag-Spont None  LIV    Birth Comments: gestational diabetes  1 SAB 2008            Social History   Socioeconomic History  . Marital status: Divorced    Spouse name: Not on file  . Number of children: 3  . Years of education: 47   . Highest education level: Not on file  Occupational History    Employer: [employer]  Social Needs  . Financial resource strain: Not on file  . Food insecurity:    Worry: Not on file    Inability: Not on file  . Transportation needs:    Medical: Not on file    Non-medical: Not on file  Tobacco Use  . Smoking status: Never Smoker  . Smokeless tobacco: Never Used  Substance and Sexual Activity  . Alcohol use: No    Alcohol/week: 0.0 oz  . Drug use: No  . Sexual activity: Yes    Birth control/protection: None  Lifestyle  . Physical activity:    Days per week: Not on file    Minutes per session:  Not on file  . Stress: Not on file  Relationships  . Social connections:    Talks on phone: Not on file    Gets together: Not on file    Attends religious service: Not on file    Active member of club or organization: Not on file    Attends meetings of clubs or organizations: Not on file    Relationship status: Not on file  Other Topics Concern  . Not on file  Social History Narrative   Lives with 3 children.   Immediate family in [place].    Born in [place].   Raised in [place], [place].    Lives in a 2 story home.   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 Asthma Mother   . Heart disease Mother   . Hypertension Mother   . Asthma Maternal Aunt   . Hypertension Maternal Aunt   . Diabetes Maternal Aunt   . Asthma Maternal Uncle   . Hypertension Maternal Uncle   . Diabetes Maternal Uncle   . Heart disease Maternal Grandmother   . Diabetes Maternal Grandmother   . Hypertension Maternal Grandmother   . Heart disease Maternal Grandfather   . Diabetes Maternal Grandfather   . Hypertension  Maternal Grandfather   . Hypertension Paternal Grandmother   . Hypotension Neg Hx   . Anesthesia problems Neg Hx   . Malignant hyperthermia Neg Hx   . Pseudochol deficiency Neg Hx   . Cancer Neg Hx     (Not in a hospital admission)  Allergies  Allergen Reactions  . Other Anaphylaxis    "20 different types of trees"  . Latex Hives and Itching  . Zofran Itching    Review of Systems: Negative except for what is mentioned in HPI.  Objective:   Vitals:   09/14/17 0957  BP: 109/70  Pulse: 81  Weight: 251 lb 12.8 oz (114.2 kg)    Fetal Status:     Movement: Absent     Physical Exam: BP 109/70   Pulse 81   Wt 251 lb 12.8 oz (114.2 kg)   LMP 05/01/2017 (Exact Date) Comment: Pt had a miscarriage 06/26/17  BMI 46.05 kg/m  CONSTITUTIONAL: Well-developed, well-nourished female in no acute distress.  NEUROLOGIC: Alert and oriented to person, place, and time. Normal reflexes, muscle tone coordination. No cranial nerve deficit noted. PSYCHIATRIC: Normal mood and affect. Normal behavior. Normal judgment and thought content. SKIN: Skin is warm and dry. No rash noted. Not diaphoretic. No erythema. No pallor. HENT:  Normocephalic, atraumatic, External right and left ear normal. Oropharynx is clear and moist EYES: Conjunctivae and EOM are normal. Pupils are equal, round, and reactive to light. No scleral icterus.  NECK: Normal range of motion, supple, no masses CARDIOVASCULAR: Normal heart rate noted, regular rhythm RESPIRATORY: Effort and breath sounds normal, no problems with respiration noted BREASTS: deferred ABDOMEN: Soft, nontender, nondistended, gravid. GU: deferred (done in MAU recently) MUSCULOSKELETAL: Normal range of motion. EXT:  No edema and no tenderness. 2+ distal pulses.   Assessment and Plan:  Pregnancy: [id number] at [redacted]w[redacted]d by early [place]  1. Supervision of other normal pregnancy, antepartum - CHL AMB BABYSCRIPTS OPT IN - Culture, OB Urine - Cystic fibrosis gene  test - Genetic Screening - SMN1 COPY NUMBER ANALYSIS (SMA Carrier Screen) - Obstetric Panel, Including HIV - Hemoglobin A1C  2. H/O gestational diabetes in prior  pregnancy, currently pregnant - Hemoglobin A1C   Preterm labor symptoms and general obstetric precautions including but not limited to vaginal bleeding, contractions, leaking of fluid and fetal movement were reviewed in detail with the patient.  Please refer to After Visit Summary for other counseling recommendations.   Return in about 1 month (around 10/12/2017).  Conan Bowens 09/14/2017 10:33 AM

## 2017-09-21 ENCOUNTER — Encounter: Payer: Self-pay | Admitting: Surgery

## 2017-09-21 ENCOUNTER — Encounter: Payer: Self-pay | Admitting: *Deleted

## 2017-09-21 LAB — OBSTETRIC PANEL, INCLUDING HIV
Antibody Screen: NEGATIVE
BASOS ABS: 0 10*3/uL (ref 0.0–0.2)
Basos: 0 %
EOS (ABSOLUTE): 0.4 10*3/uL (ref 0.0–0.4)
Eos: 5 %
HIV SCREEN 4TH GENERATION: NONREACTIVE
Hematocrit: 36.7 % (ref 34.0–46.6)
Hemoglobin: 11.3 g/dL (ref 11.1–15.9)
Hepatitis B Surface Ag: NEGATIVE
IMMATURE GRANS (ABS): 0 10*3/uL (ref 0.0–0.1)
Immature Granulocytes: 0 %
LYMPHS: 40 %
Lymphocytes Absolute: 3 10*3/uL (ref 0.7–3.1)
MCH: 25.2 pg — ABNORMAL LOW (ref 26.6–33.0)
MCHC: 30.8 g/dL — ABNORMAL LOW (ref 31.5–35.7)
MCV: 82 fL (ref 79–97)
MONOCYTES: 7 %
Monocytes Absolute: 0.5 10*3/uL (ref 0.1–0.9)
NEUTROS ABS: 3.5 10*3/uL (ref 1.4–7.0)
Neutrophils: 48 %
PLATELETS: 331 10*3/uL (ref 150–379)
RBC: 4.48 x10E6/uL (ref 3.77–5.28)
RDW: 15.3 % (ref 12.3–15.4)
RPR: NONREACTIVE
RUBELLA: 1.28 {index} (ref >0.99)
Rh Factor: POSITIVE
WBC: 7.4 10*3/uL (ref 3.4–10.8)

## 2017-09-21 LAB — SMN1 COPY NUMBER ANALYSIS (SMA CARRIER SCREENING)

## 2017-09-21 LAB — CYSTIC FIBROSIS GENE TEST

## 2017-09-21 LAB — HEMOGLOBIN A1C
Est. average glucose Bld gHb Est-mCnc: 111 mg/dL
Hgb A1c MFr Bld: 5.5 % (ref 4.8–5.6)

## 2017-09-26 ENCOUNTER — Telehealth: Payer: Self-pay | Admitting: *Deleted

## 2017-09-26 NOTE — Telephone Encounter (Addendum)
Voice mail message left on 5/9 from [facility] representative with questions about what test was ordered for pt. Please call back  [phone].  Reference # [id number]  Additional voice mail message left on 5/10 @ 1109 from [place]. The representative asked if we wanted the results of Panorama faxed.

## 2017-09-27 ENCOUNTER — Encounter: Payer: Self-pay | Admitting: *Deleted

## 2017-10-07 NOTE — Telephone Encounter (Signed)
Results are in chart

## 2017-10-11 ENCOUNTER — Encounter: Payer: Self-pay | Admitting: *Deleted

## 2017-10-12 ENCOUNTER — Ambulatory Visit (INDEPENDENT_AMBULATORY_CARE_PROVIDER_SITE_OTHER): Payer: BLUE CROSS/BLUE SHIELD | Admitting: Student

## 2017-10-12 VITALS — BP 116/69 | HR 97 | Wt 252.0 lb

## 2017-10-12 DIAGNOSIS — Z3480 Encounter for supervision of other normal pregnancy, unspecified trimester: Secondary | ICD-10-CM

## 2017-10-12 DIAGNOSIS — O26899 Other specified pregnancy related conditions, unspecified trimester: Secondary | ICD-10-CM

## 2017-10-12 DIAGNOSIS — R102 Pelvic and perineal pain: Secondary | ICD-10-CM

## 2017-10-12 MED ORDER — COMFORT FIT MATERNITY SUPP LG MISC: 1.0000 [IU] | Freq: Every day | 0 refills | Status: DC

## 2017-10-12 NOTE — Patient Instructions (Addendum)
Eating Plan for Pregnant Women While you are pregnant, your body will require additional nutrition to help support your growing baby. It is recommended that you consume:  150 additional calories each day during your first trimester.  300 additional calories each day during your second trimester.  300 additional calories each day during your third trimester.  Eating a healthy, well-balanced diet is very important for your health and for your baby's health. You also have a higher need for some vitamins and minerals, such as folic acid, calcium, iron, and vitamin D. What do I need to know about eating during pregnancy?  Do not try to lose weight or go on a diet during pregnancy.  Choose healthy, nutritious foods. Choose  of a sandwich with a glass of milk instead of a candy bar or a high-calorie sugar-sweetened beverage.  Limit your overall intake of foods that have "empty calories." These are foods that have little nutritional value, such as sweets, desserts, candies, sugar-sweetened beverages, and fried foods.  Eat a variety of foods, especially fruits and vegetables.  Take a prenatal vitamin to help meet the additional needs during pregnancy, specifically for folic acid, iron, calcium, and vitamin D.  Remember to stay active. Ask your health care provider for exercise recommendations that are specific to you.  Practice good food safety and cleanliness, such as washing your hands before you eat and after you prepare raw meat. This helps to prevent foodborne illnesses, such as listeriosis, that can be very dangerous for your baby. Ask your health care provider for more information about listeriosis. What does 150 extra calories look like? Healthy options for an additional 150 calories each day could be any of the following:  Plain low-fat yogurt (6-8 oz) with  cup of berries.  1 apple with 2 teaspoons of peanut butter.  Cut-up vegetables with  cup of hummus.  Low-fat chocolate  milk (8 oz or 1 cup).  1 string cheese with 1 medium orange.   of a peanut butter and jelly sandwich on whole-wheat bread (1 tsp of peanut butter).  For 300 calories, you could eat two of those healthy options each day. What is a healthy amount of weight to gain? The recommended amount of weight for you to gain is based on your pre-pregnancy BMI. If your pre-pregnancy BMI was:  Less than 18 (underweight), you should gain 28-40 lb.  18-24.9 (normal), you should gain 25-35 lb.  25-29.9 (overweight), you should gain 15-25 lb.  Greater than 30 (obese), you should gain 11-20 lb.  What if I am having twins or multiples? Generally, pregnant women who will be having twins or multiples may need to increase their daily calories by 300-600 calories each day. The recommended range for total weight gain is 25-54 lb, depending on your pre-pregnancy BMI. Talk with your health care provider for specific guidance about additional nutritional needs, weight gain, and exercise during your pregnancy. What foods can I eat? Grains Any grains. Try to choose whole grains, such as whole-wheat bread, oatmeal, or brown rice. Vegetables Any vegetables. Try to eat a variety of colors and types of vegetables to get a full range of vitamins and minerals. Remember to wash your vegetables well before eating. Fruits Any fruits. Try to eat a variety of colors and types of fruit to get a full range of vitamins and minerals. Remember to wash your fruits well before eating. Meats and Other Protein Sources Lean meats, including chicken, turkey, fish, and lean cuts of beef, veal,   or pork. Make sure that all meats are cooked to "well done." Tofu. Tempeh. Beans. Eggs. Peanut butter and other nut butters. Seafood, such as shrimp, crab, and lobster. If you choose fish, select types that are higher in omega-3 fatty acids, including salmon, herring, mussels, trout, sardines, and pollock. Make sure that all meats are cooked to  food-safe temperatures. Dairy Pasteurized milk and milk alternatives. Pasteurized yogurt and pasteurized cheese. Cottage cheese. Sour cream. Beverages Water. Juices that contain 100% fruit juice or vegetable juice. Caffeine-free teas and decaffeinated coffee. Drinks that contain caffeine are okay to drink, but it is better to avoid caffeine. Keep your total caffeine intake to less than 200 mg each day (12 oz of coffee, tea, or soda) or as directed by your health care provider. Condiments Any pasteurized condiments. Sweets and Desserts Any sweets and desserts. Fats and Oils Any fats and oils. The items listed above may not be a complete list of recommended foods or beverages. Contact your dietitian for more options. What foods are not recommended? Vegetables Unpasteurized (raw) vegetable juices. Fruits Unpasteurized (raw) fruit juices. Meats and Other Protein Sources Cured meats that have nitrates, such as bacon, salami, and hotdogs. Luncheon meats, bologna, or other deli meats (unless they are reheated until they are steaming hot). Refrigerated pate, meat spreads from a meat counter, smoked seafood that is found in the refrigerated section of a store. Raw fish, such as sushi or sashimi. High mercury content fish, such as tilefish, shark, swordfish, and king mackerel. Raw meats, such as tuna or beef tartare. Undercooked meats and poultry. Make sure that all meats are cooked to food-safe temperatures. Dairy Unpasteurized (raw) milk and any foods that have raw milk in them. Soft cheeses, such as feta, queso blanco, queso fresco, Brie, Camembert cheeses, blue-veined cheeses, and Panela cheese (unless it is made with pasteurized milk, which must be stated on the label). Beverages Alcohol. Sugar-sweetened beverages, such as sodas, teas, or energy drinks. Condiments Homemade fermented foods and drinks, such as pickles, sauerkraut, or kombucha drinks. (Store-bought pasteurized versions of these are  okay.) Other Salads that are made in the store, such as ham salad, chicken salad, egg salad, tuna salad, and seafood salad. The items listed above may not be a complete list of foods and beverages to avoid. Contact your dietitian for more information. This information is not intended to replace advice given to you by your health care provider. Make sure you discuss any questions you have with your health care provider. Document Released: 02/15/2014 Document Revised: 10/09/2015 Document Reviewed: 10/16/2013 Elsevier Interactive Patient Education  2018 Elsevier Inc.   

## 2017-10-12 NOTE — Progress Notes (Signed)
   PRENATAL VISIT NOTE  Subjective:  Laurie Reed is a 32 y.o. [id number] at [redacted]w[redacted]d being seen today for ongoing prenatal care.  She is currently monitored for the following issues for this high-risk pregnancy and has Right knee pain; Headache(784.0); Patellofemoral arthralgia of right knee; Ligamentous laxity of right ankle; Severe obesity (BMI >= 40) (HCC); Foot pain, bilateral; Fatigue; Sacroiliac joint dysfunction of left side; Tinea capitis; Anxiety and depression; Vitamin D deficiency; Asthma; Scalp lesion; Astigmatism; Bilateral hand pain; Right carpal tunnel syndrome; Polymyalgia (HCC); Polyarthralgia; Foot swelling; Cervical myelopathy (HCC); Skin pustule; Vagina itching; Diabetes mellitus screening; Nasal turbinate hypertrophy; Chronic nonintractable headache; Seasonal allergic rhinitis; Inflamed sebaceous cyst; Supervision of other normal pregnancy, antepartum; and H/O gestational diabetes in prior pregnancy, currently pregnant on their problem list.  Patient reports ocassional LLQ pain, bilateral LE swelling while at work, and low back pain. .  Contractions: Not present. Vag. Bleeding: None.  Movement: Absent. Denies leaking of fluid.   The following portions of the patient's history were reviewed and updated as appropriate: allergies, current medications, past family history, past medical history, past social history, past surgical history and problem list. Problem list updated.  Objective:   Vitals:   10/12/17 1125  BP: 116/69  Pulse: 97  Weight: 252 lb (114.3 kg)    Fetal Status: Fetal Heart Rate (bpm): 151   Movement: Absent     General:  Alert, oriented and cooperative. Patient is in no acute distress.  Skin: Skin is warm and dry. No rash noted.   Cardiovascular: Normal heart rate noted  Respiratory: Normal respiratory effort, no problems with respiration noted  Abdomen: Soft, gravid, appropriate for gestational age.  Pain/Pressure: Present     Pelvic: Cervical exam  deferred        Extremities: Normal range of motion.  Edema: Trace  Mental Status: Normal mood and affect. Normal behavior. Normal judgment and thought content.   Assessment and Plan:  Pregnancy: [id number] at [redacted]w[redacted]d  1. Supervision of other normal pregnancy, antepartum -reviewed previous labs. Discussed AFP at next visit.  - [place] MFM OB COMP + 14 WK; Future  2. Pain of round ligament during pregnancy  - Elastic Bandages & Supports (COMFORT FIT MATERNITY SUPP LG) MISC; 1 Units by Does not apply route daily.  Dispense: 1 each; Refill: 0  Preterm labor symptoms and general obstetric precautions including but not limited to vaginal bleeding, contractions, leaking of fluid and fetal movement were reviewed in detail with the patient. Please refer to After Visit Summary for other counseling recommendations.  Return in about 1 month (around 11/09/2017) for Routine OB.  Future Appointments  Date Time Provider Department Center  11/09/2017 10:15 AM Judeth Horn, NP [facility]  11/14/2017 10:00 AM [facility] [place] 3 [facility]    Judeth Horn, NP

## 2017-10-12 NOTE — Progress Notes (Signed)
PT STATES HAVING CRAMPS IN BACK

## 2017-10-19 ENCOUNTER — Encounter: Payer: Self-pay | Admitting: Family Medicine

## 2017-10-19 DIAGNOSIS — Z6841 Body Mass Index (BMI) 40.0 and over, adult: Secondary | ICD-10-CM

## 2017-10-19 DIAGNOSIS — O99210 Obesity complicating pregnancy, unspecified trimester: Secondary | ICD-10-CM

## 2017-11-02 ENCOUNTER — Encounter: Payer: BLUE CROSS/BLUE SHIELD | Admitting: Student

## 2017-11-04 ENCOUNTER — Encounter (HOSPITAL_COMMUNITY): Payer: Self-pay

## 2017-11-06 ENCOUNTER — Encounter (HOSPITAL_COMMUNITY): Payer: Self-pay

## 2017-11-06 ENCOUNTER — Other Ambulatory Visit: Payer: Self-pay

## 2017-11-06 ENCOUNTER — Inpatient Hospital Stay (HOSPITAL_COMMUNITY)
Admission: AD | Admit: 2017-11-06 | Discharge: 2017-11-06 | Disposition: A | Discharge status: Home / Self Care | Payer: Medicaid Other | Source: Ambulatory Visit | Attending: Obstetrics and Gynecology | Admitting: Obstetrics and Gynecology

## 2017-11-06 DIAGNOSIS — O98812 Other maternal infectious and parasitic diseases complicating pregnancy, second trimester: Secondary | ICD-10-CM | POA: Insufficient documentation

## 2017-11-06 DIAGNOSIS — K219 Gastro-esophageal reflux disease without esophagitis: Secondary | ICD-10-CM | POA: Diagnosis not present

## 2017-11-06 DIAGNOSIS — O3482 Maternal care for other abnormalities of pelvic organs, second trimester: Secondary | ICD-10-CM | POA: Insufficient documentation

## 2017-11-06 DIAGNOSIS — Z3A17 17 weeks gestation of pregnancy: Secondary | ICD-10-CM

## 2017-11-06 DIAGNOSIS — Z888 Allergy status to other drugs, medicaments and biological substances status: Secondary | ICD-10-CM | POA: Diagnosis not present

## 2017-11-06 DIAGNOSIS — O9989 Other specified diseases and conditions complicating pregnancy, childbirth and the puerperium: Secondary | ICD-10-CM | POA: Diagnosis not present

## 2017-11-06 DIAGNOSIS — J45909 Unspecified asthma, uncomplicated: Secondary | ICD-10-CM | POA: Diagnosis not present

## 2017-11-06 DIAGNOSIS — Z825 Family history of asthma and other chronic lower respiratory diseases: Secondary | ICD-10-CM | POA: Diagnosis not present

## 2017-11-06 DIAGNOSIS — R51 Headache: Secondary | ICD-10-CM

## 2017-11-06 DIAGNOSIS — Z91040 Latex allergy status: Secondary | ICD-10-CM | POA: Insufficient documentation

## 2017-11-06 DIAGNOSIS — B3731 Acute candidiasis of vulva and vagina: Secondary | ICD-10-CM

## 2017-11-06 DIAGNOSIS — Z79899 Other long term (current) drug therapy: Secondary | ICD-10-CM | POA: Insufficient documentation

## 2017-11-06 DIAGNOSIS — Z833 Family history of diabetes mellitus: Secondary | ICD-10-CM | POA: Insufficient documentation

## 2017-11-06 DIAGNOSIS — R519 Headache, unspecified: Secondary | ICD-10-CM

## 2017-11-06 DIAGNOSIS — O26892 Other specified pregnancy related conditions, second trimester: Secondary | ICD-10-CM | POA: Diagnosis not present

## 2017-11-06 DIAGNOSIS — Z8249 Family history of ischemic heart disease and other diseases of the circulatory system: Secondary | ICD-10-CM | POA: Diagnosis not present

## 2017-11-06 DIAGNOSIS — R040 Epistaxis: Secondary | ICD-10-CM | POA: Diagnosis not present

## 2017-11-06 DIAGNOSIS — L304 Erythema intertrigo: Secondary | ICD-10-CM | POA: Diagnosis not present

## 2017-11-06 DIAGNOSIS — Z9889 Other specified postprocedural states: Secondary | ICD-10-CM | POA: Insufficient documentation

## 2017-11-06 DIAGNOSIS — O99512 Diseases of the respiratory system complicating pregnancy, second trimester: Secondary | ICD-10-CM | POA: Insufficient documentation

## 2017-11-06 DIAGNOSIS — B373 Candidiasis of vulva and vagina: Secondary | ICD-10-CM | POA: Insufficient documentation

## 2017-11-06 DIAGNOSIS — O99712 Diseases of the skin and subcutaneous tissue complicating pregnancy, second trimester: Secondary | ICD-10-CM | POA: Diagnosis not present

## 2017-11-06 DIAGNOSIS — O99612 Diseases of the digestive system complicating pregnancy, second trimester: Secondary | ICD-10-CM | POA: Diagnosis not present

## 2017-11-06 LAB — URINALYSIS, ROUTINE W REFLEX MICROSCOPIC
Bilirubin Urine: NEGATIVE
Hgb urine dipstick: NEGATIVE
KETONES UR: NEGATIVE mg/dL
Leukocytes, UA: NEGATIVE
Nitrite: NEGATIVE
PROTEIN: NEGATIVE mg/dL
Specific Gravity, Urine: 1.016 (ref 1.005–1.030)
pH: 6 (ref 5.0–8.0)

## 2017-11-06 LAB — WET PREP, GENITAL
CLUE CELLS WET PREP: NONE SEEN
Sperm: NONE SEEN
Trich, Wet Prep: NONE SEEN

## 2017-11-06 LAB — GLUCOSE, CAPILLARY: Glucose-Capillary: 76 mg/dL (ref 65–99)

## 2017-11-06 MED ORDER — TERCONAZOLE 0.8 % VA CREA: 1.0000 | TOPICAL_CREAM | Freq: Every day | VAGINAL | 0 refills | Status: DC

## 2017-11-06 MED ORDER — ACETAMINOPHEN 500 MG PO TABS
1000.0000 mg | ORAL_TABLET | Freq: Once | ORAL | Status: AC
  Administered 2017-11-06: 1000 mg via ORAL
  Filled 2017-11-06: qty 2

## 2017-11-06 MED ORDER — NYSTATIN 100000 UNIT/GM EX POWD: Freq: Four times a day (QID) | CUTANEOUS | 0 refills | Status: DC

## 2017-11-06 MED ORDER — BUTALBITAL-APAP-CAFFEINE 50-325-40 MG PO TABS: 1.0000 | ORAL_TABLET | Freq: Three times a day (TID) | ORAL | 0 refills | Status: DC | PRN

## 2017-11-06 NOTE — MAU Note (Signed)
RN unable to doppler fetal heart tones for an average.  Doppler picked up 155 very briefly.

## 2017-11-06 NOTE — MAU Note (Addendum)
Headache for 2 days, has tried tylenol and has relief but headache returns once tylenol wears off  Nose bleed this AM, no hx  Abdominal pain for the past week, lower  Skin irritation under breasts, sore and some itching, reports skin is peeling off, redness  Some vaginal itching

## 2017-11-06 NOTE — MAU Provider Note (Signed)
History     CSN: [id number]  Arrival date and time: 11/06/17 1129   First Provider Initiated Contact with Patient 11/06/17 1222      Chief Complaint  Patient presents with  . Headache  . Epistaxis  . Skin Irritation under breast  . Vaginal Itching   HPI  Laurie Reed is a 32 y.o. [id number] at [redacted]w[redacted]d who presents today with multiple complaints.  Reports nose bleed last night. Noticed bright red blood on tissue. No active bleeding. No bleeding since last night. Last time she had a nosebleed was during previous pregnancy that ended in miscarriage.  Reports headache since yesterday. Frontal headache that is worse with sounds. Pain does not radiate. No neck pain. No fever/chills. Has been treating with tylenol with relief, but states the headache returned when the tylenol wore off. Has not taken anything for pain today.  Vaginal irritation and itching today. Has not noticed any vaginal discharge. No vaginal bleeding.  Reports painful rash under bilateral breasts. States she has been trying to keep area dry with baby powder without relief. Area is painful and skin is peeling. States this happened in a previous pregnancy.   OB History    Gravida  7   Para  3   Term  3   Preterm  0   AB  3   Living  3     SAB  3   TAB  0   Ectopic  0   Multiple  0   Live Births  3           Past Medical History:  Diagnosis Date  . Acid reflux 2004  . Anemia   . Anxiety 2008  . Asthma 1993   no previous intubations or hospitalizations   . Chlamydia   . Depression 2008   no meds, currenly ok  . Environmental allergies   . Migraines   . Miscarriage   . Ovarian cyst   . Pre-diabetes   . Ulcer of the stomach and intestine 2004  . Urinary tract infection     Past Surgical History:  Procedure Laterality Date  . COLONOSCOPY  2004   . IRRIGATION AND DEBRIDEMENT SEBACEOUS CYST N/A 06/01/2017   Procedure: EXCISION SEBACEOUS CYST ON SCALP;  Surgeon: Ancil Linsey, MD;   Location: [facility] ORS;  Service: General;  Laterality: N/A;  . MULTIPLE TOOTH EXTRACTIONS  2015   6 teeth removed   . WISDOM TOOTH EXTRACTION  2007    Family History  Problem Relation Age of Onset  . Asthma Mother   . Heart disease Mother   . Hypertension Mother   . Asthma Maternal Aunt   . Hypertension Maternal Aunt   . Diabetes Maternal Aunt   . Asthma Maternal Uncle   . Hypertension Maternal Uncle   . Diabetes Maternal Uncle   . Heart disease Maternal Grandmother   . Diabetes Maternal Grandmother   . Hypertension Maternal Grandmother   . Heart disease Maternal Grandfather   . Diabetes Maternal Grandfather   . Hypertension Maternal Grandfather   . Hypertension Paternal Grandmother   . Hypotension Neg Hx   . Anesthesia problems Neg Hx   . Malignant hyperthermia Neg Hx   . Pseudochol deficiency Neg Hx   . Cancer Neg Hx     Social History   Tobacco Use  . Smoking status: Never Smoker  . Smokeless tobacco: Never Used  Substance Use Topics  . Alcohol use: No    Alcohol/week: 0.0 oz  .  Drug use: No    Allergies:  Allergies  Allergen Reactions  . Other Anaphylaxis    "20 different types of trees"  . Latex Hives and Itching  . Zofran Itching    Medications Prior to Admission  Medication Sig Dispense Refill Last Dose  . acetaminophen (TYLENOL) 500 MG tablet Take 500 mg by mouth every 6 (six) hours as needed.   Taking  . cetirizine (ZYRTEC) 10 MG tablet Take 10 mg by mouth daily.   Taking  . Elastic Bandages & Supports (COMFORT FIT MATERNITY SUPP LG) MISC 1 Units by Does not apply route daily. 1 each 0   . loratadine (CLARITIN) 10 MG tablet Take 10 mg by mouth daily.   Taking  . Prenatal Multivit-Min-Fe-FA (PRENATAL VITAMINS) 0.8 MG tablet Take 1 tablet by mouth daily. 30 tablet 12 Taking    Review of Systems  Constitutional: Negative.   HENT: Positive for nosebleeds.   Eyes: Negative for photophobia and visual disturbance.  Gastrointestinal: Negative.    Genitourinary: Positive for vaginal discharge.  Neurological: Positive for headaches.   Physical Exam   Blood pressure 110/60, pulse 94, temperature 98.3 F (36.8 C), temperature source Oral, resp. rate 18, height 5\' 2"  (1.575 m), weight 256 lb (116.1 kg), last menstrual period 05/01/2017, unknown if currently breastfeeding.  Physical Exam  Nursing note and vitals reviewed. Constitutional: She is oriented to person, place, and time. She appears well-developed and well-nourished. No distress.  HENT:  Head: Normocephalic and atraumatic.  Eyes: Conjunctivae are normal. Right eye exhibits no discharge. Left eye exhibits no discharge. No scleral icterus.  Neck: Normal range of motion.  Cardiovascular: Normal rate, regular rhythm and normal heart sounds.  No murmur heard. Respiratory: Effort normal and breath sounds normal. No respiratory distress. She has no wheezes.  GI: Soft. There is no tenderness.  Neurological: She is alert and oriented to person, place, and time.  Skin: Skin is warm and dry. She is not diaphoretic.  Psychiatric: She has a normal mood and affect. Her behavior is normal. Judgment and thought content normal.    MAU Course  Procedures Results for orders placed or performed during the hospital encounter of 11/06/17 (from the past 24 hour(s))  Urinalysis, Routine w reflex microscopic     Status: Abnormal   Collection Time: 11/06/17 11:50 AM  Result Value Ref Range   Color, Urine YELLOW YELLOW   APPearance CLEAR CLEAR   Specific Gravity, Urine 1.016 1.005 - 1.030   pH 6.0 5.0 - 8.0   Glucose, UA >=500 (A) NEGATIVE mg/dL   Hgb urine dipstick NEGATIVE NEGATIVE   Bilirubin Urine NEGATIVE NEGATIVE   Ketones, ur NEGATIVE NEGATIVE mg/dL   Protein, ur NEGATIVE NEGATIVE mg/dL   Nitrite NEGATIVE NEGATIVE   Leukocytes, UA NEGATIVE NEGATIVE   RBC / HPF 0-5 0 - 5 RBC/hpf   WBC, UA 0-5 0 - 5 WBC/hpf   Bacteria, UA RARE (A) NONE SEEN   Squamous Epithelial / LPF 0-5 0 - 5    Mucus PRESENT   Glucose, capillary     Status: None   Collection Time: 11/06/17  1:09 PM  Result Value Ref Range   Glucose-Capillary 76 65 - 99 mg/dL  Wet prep, genital     Status: Abnormal   Collection Time: 11/06/17  1:13 PM  Result Value Ref Range   Yeast Wet Prep HPF POC PRESENT (A) NONE SEEN   Trich, Wet Prep NONE SEEN NONE SEEN   Clue Cells Wet Prep HPF  POC NONE SEEN NONE SEEN   WBC, Wet Prep HPF POC FEW (A) NONE SEEN   Sperm NONE SEEN     MDM RN unable to doppler FHT  Pt informed that the ultrasound is considered a limited OB ultrasound and is not intended to be a complete ultrasound exam.  Patient also informed that the ultrasound is not being completed with the intent of assessing for fetal or placental anomalies or any pelvic abnormalities.  Explained that the purpose of today's ultrasound is to assess for  viability.  Patient acknowledges the purpose of the exam and the limitations of the study.   Active fetus with FHR 160s.   U/a with 500+ glucose. Patient drinking blue juice-like drink when she got here. CBG 76.  VSS, NAD  GC/CT & wet prep. Wet prep + yeast. Will tx with terazol.   Pt drove self here. Offered tylenol here & small rx for fioricet. Tylenol 1 gm po given here with relief.  Assessment and Plan  A; 1. Intertriginous dermatitis associated with moisture   2. Vaginal yeast infection   3. Headache in pregnancy, antepartum, second trimester   4. [redacted] weeks gestation of pregnancy    P: Discharge home Rx nystatin powder, fioricet (sparingly if OTC meds fail), terazol F/u in office as scheduled Discussed reasons to return to MAU GC/CT pendgin  Judeth Hornrin Brad Lieurance 11/06/2017, 12:22 PM

## 2017-11-06 NOTE — Discharge Instructions (Signed)
Intertrigo Intertrigo is skin irritation or inflammation (dermatitis) that occurs when folds of skin rub together. The irritation can cause a rash and make skin raw and itchy. This condition most commonly occurs in the skin folds of these areas:  Toes.  Armpits.  Groin.  Belly.  Breasts.  Buttocks.  Intertrigo is not passed from person to person (is not contagious). What are the causes? This condition is caused by heat, moisture, friction, and lack of air circulation. The condition can be made worse by:  Sweat.  Bacteria or a fungus, such as yeast.  What increases the risk? This condition is more likely to occur if you have moisture in your skin folds. It is also more likely to develop in people who:  Have diabetes.  Are overweight.  Are on bed rest.  Live in a warm and moist climate.  Wear splints, braces, or other medical devices.  Are not able to control their bowels or bladder (have incontinence).  What are the signs or symptoms? Symptoms of this condition include:  A pink or red skin rash.  Brown patches on the skin.  Raw or scaly skin.  Itchiness.  A burning feeling.  Bleeding.  Leaking fluid.  A bad smell.  How is this diagnosed? This condition is diagnosed with a medical history and physical exam. You may also have a skin swab to test for bacteria or a fungus, such as yeast. How is this treated? Treatment may include:  Cleaning and drying your skin.  An oral antibiotic medicine or antibiotic skin cream for a bacterial infection.  Antifungal cream or pills for an infection that was caused by a fungus, such as yeast.  Steroid ointment to relieve itchiness and irritation.  Follow these instructions at home:  Keep the affected area clean and dry.  Do not scratch your skin.  Stay in a cool environment as much as possible. Use an air conditioner or fan, if available.  Apply over-the-counter and prescription medicines only as told by your  health care provider.  If you were prescribed an antibiotic medicine, use it as told by your health care provider. Do not stop using the antibiotic even if your condition improves.  Keep all follow-up visits as told by your health care provider. This is important. How is this prevented?  Maintain a healthy weight.  Take care of your feet, especially if you have diabetes. Foot care includes: ? Wearing shoes that fit well. ? Keeping your feet dry. ? Wearing clean, breathable socks.  Protect the skin around your groin and buttocks, especially if you have incontinence. Skin protection includes: ? Following a regular cleaning routine. ? Using moisturizers and skin protectants. ? Changing protection pads frequently.  Do not wear tight clothes. Wear clothes that are loose and absorbent. Wear clothes that are made of cotton.  Wear a bra that gives good support, if needed.  Shower and dry yourself thoroughly after activity. Use a hair dryer on a cool setting to dry between skin folds, especially after you bathe.  If you have diabetes, keep your blood sugar under control. Contact a health care provider if:  Your symptoms do not improve with treatment.  Your symptoms get worse or they spread.  You notice increased redness and warmth.  You have a fever. This information is not intended to replace advice given to you by your health care provider. Make sure you discuss any questions you have with your health care provider. Document Released: 05/03/2005 Document Revised: 10/09/2015   Document Reviewed: 11/04/2014 Elsevier Interactive Patient Education  2018 Elsevier Inc.     General Headache Without Cause A headache is pain or discomfort felt around the head or neck area. The specific cause of a headache may not be found. There are many causes and types of headaches. A few common ones are:  Tension headaches.  Migraine headaches.  Cluster headaches.  Chronic daily  headaches.  Follow these instructions at home: Watch your condition for any changes. Take these steps to help with your condition: Managing pain  Take over-the-counter and prescription medicines only as told by your health care provider.  Lie down in a dark, quiet room when you have a headache.  If directed, apply ice to the head and neck area: ? Put ice in a plastic bag. ? Place a towel between your skin and the bag. ? Leave the ice on for 20 minutes, 2-3 times per day.  Use a heating pad or hot shower to apply heat to the head and neck area as told by your health care provider.  Keep lights dim if bright lights bother you or make your headaches worse. Eating and drinking  Eat meals on a regular schedule.  Limit alcohol use.  Decrease the amount of caffeine you drink, or stop drinking caffeine. General instructions  Keep all follow-up visits as told by your health care provider. This is important.  Keep a headache journal to help find out what may trigger your headaches. For example, write down: ? What you eat and drink. ? How much sleep you get. ? Any change to your diet or medicines.  Try massage or other relaxation techniques.  Limit stress.  Sit up straight, and do not tense your muscles.  Do not use tobacco products, including cigarettes, chewing tobacco, or e-cigarettes. If you need help quitting, ask your health care provider.  Exercise regularly as told by your health care provider.  Sleep on a regular schedule. Get 7-9 hours of sleep, or the amount recommended by your health care provider. Contact a health care provider if:  Your symptoms are not helped by medicine.  You have a headache that is different from the usual headache.  You have nausea or you vomit.  You have a fever. Get help right away if:  Your headache becomes severe.  You have repeated vomiting.  You have a stiff neck.  You have a loss of vision.  You have problems with  speech.  You have pain in the eye or ear.  You have muscular weakness or loss of muscle control.  You lose your balance or have trouble walking.  You feel faint or pass out.  You have confusion. This information is not intended to replace advice given to you by your health care provider. Make sure you discuss any questions you have with your health care provider. Document Released: 05/03/2005 Document Revised: 10/09/2015 Document Reviewed: 08/26/2014 Elsevier Interactive Patient Education  [employer]2018 Elsevier Inc.

## 2017-11-07 LAB — GC/CHLAMYDIA PROBE AMP (~~LOC~~) NOT AT ARMC
Chlamydia: NEGATIVE
NEISSERIA GONORRHEA: NEGATIVE

## 2017-11-09 ENCOUNTER — Ambulatory Visit (INDEPENDENT_AMBULATORY_CARE_PROVIDER_SITE_OTHER): Payer: Medicaid Other | Admitting: Student

## 2017-11-09 VITALS — BP 123/69 | HR 93 | Wt 254.1 lb

## 2017-11-09 DIAGNOSIS — Z3480 Encounter for supervision of other normal pregnancy, unspecified trimester: Secondary | ICD-10-CM

## 2017-11-09 DIAGNOSIS — O99210 Obesity complicating pregnancy, unspecified trimester: Secondary | ICD-10-CM

## 2017-11-09 DIAGNOSIS — Z6841 Body Mass Index (BMI) 40.0 and over, adult: Secondary | ICD-10-CM

## 2017-11-09 MED ORDER — CETIRIZINE HCL 10 MG PO TABS: 10.0000 mg | ORAL_TABLET | Freq: Every day | ORAL | 0 refills | Status: DC

## 2017-11-09 MED ORDER — ASPIRIN 81 MG PO CHEW: 81.0000 mg | CHEWABLE_TABLET | Freq: Every day | ORAL | 4 refills | Status: DC

## 2017-11-09 NOTE — Progress Notes (Signed)
   PRENATAL VISIT NOTE  Subjective:  Laurie Reed is a 32 y.o. [id number] at 252w1d being seen today for ongoing prenatal care.  She is currently monitored for the following issues for this high-risk pregnancy and has Severe obesity (BMI >= 40) (HCC); Anxiety and depression; Vitamin D deficiency; Asthma; Seasonal allergic rhinitis; Supervision of other normal pregnancy, antepartum; H/O gestational diabetes in prior pregnancy, currently pregnant; Morbid obesity with BMI of 45.0-49.9, adult (HCC); and Obesity affecting pregnancy, antepartum on their problem list.  Patient reports itching & round ligament pain. Reports abdominal & back itching; no rash. Took benadryl yesterday without relief. Uses lotion daily to avoid dry skin. No changes in soaps. .  Contractions: Not present. Vag. Bleeding: None.  Movement: Present. Denies leaking of fluid.   The following portions of the patient's history were reviewed and updated as appropriate: allergies, current medications, past family history, past medical history, past social history, past surgical history and problem list. Problem list updated.  Objective:   Vitals:   11/09/17 1045  BP: 123/69  Pulse: 93  Weight: 254 lb 1.6 oz (115.3 kg)    Fetal Status: Fetal Heart Rate (bpm): 150   Movement: Present     General:  Alert, oriented and cooperative. Patient is in no acute distress.  Skin: Skin is warm and dry. No rash noted.   Cardiovascular: Normal heart rate noted  Respiratory: Normal respiratory effort, no problems with respiration noted  Abdomen: Soft, gravid, appropriate for gestational age.  Pain/Pressure: Present     Pelvic: Cervical exam deferred        Extremities: Normal range of motion.  Edema: Trace  Mental Status: Normal mood and affect. Normal behavior. Normal judgment and thought content.   Assessment and Plan:  Pregnancy: [id number] at 4252w1d  1. Supervision of other normal pregnancy, antepartum -reviewed labs. Scheduled for  anatomy ultrasound next week.  - Bile acids, total - AFP, Serum, Open Spina Bifida  2. Morbid obesity with BMI of 45.0-49.9, adult (HCC) -Discussed recommendation d/t her BMI.  - aspirin 81 MG chewable tablet; Chew 1 tablet (81 mg total) by mouth daily.  Dispense: 30 tablet; Refill: 4 - Referral to Nutrition and Diabetes Services   Preterm labor symptoms and general obstetric precautions including but not limited to vaginal bleeding, contractions, leaking of fluid and fetal movement were reviewed in detail with the patient. Please refer to After Visit Summary for other counseling recommendations.  Return in about 1 month (around 12/07/2017) for Routine OB.  Future Appointments  Date Time Provider Department Center  11/14/2017 10:00 AM [facility] US 3 [facility]  12/07/2017  2:55 PM Judeth HornLawrence, Ambyr Qadri, NP [facility]    Judeth HornErin Taeja Debellis, NP

## 2017-11-09 NOTE — Patient Instructions (Signed)
Second Trimester of Pregnancy The second trimester is from week 13 through week 28, month 4 through 6. This is often the time in pregnancy that you feel your best. Often times, morning sickness has lessened or quit. You may have more energy, and you may get hungry more often. Your unborn baby (fetus) is growing rapidly. At the end of the sixth month, he or she is about 9 inches long and weighs about 1 pounds. You will likely feel the baby move (quickening) between 18 and 20 weeks of pregnancy.  Research childbirth classes and hospital preregistration at [email or web address]  Follow these instructions at home:  Avoid all smoking, herbs, and alcohol. Avoid drugs not approved by your doctor.  Do not use any tobacco products, including cigarettes, chewing tobacco, and electronic cigarettes. If you need help quitting, ask your doctor. You may get counseling or other support to help you quit.  Only take medicine as told by your doctor. Some medicines are safe and some are not during pregnancy.  Exercise only as told by your doctor. Stop exercising if you start having cramps.  Eat regular, healthy meals.  Wear a good support bra if your breasts are tender.  Do not use hot tubs, steam rooms, or saunas.  Wear your seat belt when driving.  Avoid raw meat, uncooked cheese, and liter boxes and soil used by cats.  Take your prenatal vitamins.  Take 1500-2000 milligrams of calcium daily starting at the 20th week of pregnancy until you deliver your baby.  Try taking medicine that helps you poop (stool softener) as needed, and if your doctor approves. Eat more fiber by eating fresh fruit, vegetables, and whole grains. Drink enough fluids to keep your pee (urine) clear or pale yellow.  Take warm water baths (sitz baths) to soothe pain or discomfort caused by hemorrhoids. Use hemorrhoid cream if your doctor approves.  If you have puffy, bulging veins (varicose veins), wear support hose. Raise  (elevate) your feet for 15 minutes, 3-4 times a day. Limit salt in your diet.  Avoid heavy lifting, wear low heals, and sit up straight.  Rest with your legs raised if you have leg cramps or low back pain.  Visit your dentist if you have not gone during your pregnancy. Use a soft toothbrush to brush your teeth. Be gentle when you floss.  You can have sex (intercourse) unless your doctor tells you not to.  Go to your doctor visits.  Get help if:  You feel dizzy.  You have mild cramps or pressure in your lower belly (abdomen).  You have a nagging pain in your belly area.  You continue to feel sick to your stomach (nauseous), throw up (vomit), or have watery poop (diarrhea).  You have bad smelling fluid coming from your vagina.  You have pain with peeing (urination). Get help right away if:  You have a fever.  You are leaking fluid from your vagina.  You have spotting or bleeding from your vagina.  You have severe belly cramping or pain.  You lose or gain weight rapidly.  You have trouble catching your breath and have chest pain.  You notice sudden or extreme puffiness (swelling) of your face, hands, ankles, feet, or legs.  You have not felt the baby move in over an hour.  You have severe headaches that do not go away with medicine.  You have vision changes. This information is not intended to replace advice given to you by your health care provider. Make   sure you discuss any questions you have with your health care provider. Document Released: 07/28/2009 Document Revised: 10/09/2015 Document Reviewed: 07/04/2012 Elsevier Interactive Patient Education  2017 [employer] Inc.    

## 2017-11-11 LAB — BILE ACIDS, TOTAL: BILE ACIDS TOTAL: 7.1 umol/L (ref 4.7–24.5)

## 2017-11-11 LAB — AFP, SERUM, OPEN SPINA BIFIDA
AFP MoM: 0.91
AFP VALUE AFPOSL: 31.4 ng/mL
GEST. AGE ON COLLECTION DATE: 18.1 wk
Maternal Age At EDD: 31 yr
OSBR RISK 1 IN: 10000
Test Results:: NEGATIVE
WEIGHT: 254 [lb_av]

## 2017-11-13 ENCOUNTER — Encounter: Payer: Self-pay | Admitting: Student

## 2017-11-14 ENCOUNTER — Ambulatory Visit (HOSPITAL_COMMUNITY)
Admission: RE | Admit: 2017-11-14 | Discharge: 2017-11-14 | Disposition: A | Discharge status: Home / Self Care | Payer: Medicaid Other | Source: Ambulatory Visit | Attending: Student | Admitting: Student

## 2017-11-14 ENCOUNTER — Other Ambulatory Visit: Payer: Self-pay | Admitting: General Practice

## 2017-11-14 DIAGNOSIS — O99212 Obesity complicating pregnancy, second trimester: Secondary | ICD-10-CM | POA: Insufficient documentation

## 2017-11-14 DIAGNOSIS — Z8632 Personal history of gestational diabetes: Secondary | ICD-10-CM | POA: Diagnosis not present

## 2017-11-14 DIAGNOSIS — E669 Obesity, unspecified: Secondary | ICD-10-CM | POA: Diagnosis not present

## 2017-11-14 DIAGNOSIS — Z363 Encounter for antenatal screening for malformations: Secondary | ICD-10-CM | POA: Diagnosis present

## 2017-11-14 DIAGNOSIS — K219 Gastro-esophageal reflux disease without esophagitis: Secondary | ICD-10-CM

## 2017-11-14 DIAGNOSIS — O09292 Supervision of pregnancy with other poor reproductive or obstetric history, second trimester: Secondary | ICD-10-CM

## 2017-11-14 DIAGNOSIS — Z3A18 18 weeks gestation of pregnancy: Secondary | ICD-10-CM | POA: Diagnosis not present

## 2017-11-14 DIAGNOSIS — O99619 Diseases of the digestive system complicating pregnancy, unspecified trimester: Principal | ICD-10-CM

## 2017-11-14 DIAGNOSIS — Z3480 Encounter for supervision of other normal pregnancy, unspecified trimester: Secondary | ICD-10-CM | POA: Diagnosis present

## 2017-11-14 MED ORDER — FAMOTIDINE 20 MG PO TABS
20.0000 mg | ORAL_TABLET | Freq: Two times a day (BID) | ORAL | 3 refills | Status: DC
Start: 2017-11-14 — End: 2018-11-05

## 2017-12-01 ENCOUNTER — Encounter (HOSPITAL_COMMUNITY): Payer: Self-pay | Admitting: *Deleted

## 2017-12-01 ENCOUNTER — Inpatient Hospital Stay (HOSPITAL_COMMUNITY)
Admission: AD | Admit: 2017-12-01 | Discharge: 2017-12-01 | Disposition: A | Discharge status: Home / Self Care | Payer: Medicaid Other | Source: Ambulatory Visit | Attending: Obstetrics & Gynecology | Admitting: Obstetrics & Gynecology

## 2017-12-01 ENCOUNTER — Other Ambulatory Visit: Payer: Medicaid Other

## 2017-12-01 ENCOUNTER — Other Ambulatory Visit: Payer: Self-pay

## 2017-12-01 DIAGNOSIS — F419 Anxiety disorder, unspecified: Secondary | ICD-10-CM | POA: Diagnosis not present

## 2017-12-01 DIAGNOSIS — Z833 Family history of diabetes mellitus: Secondary | ICD-10-CM | POA: Diagnosis not present

## 2017-12-01 DIAGNOSIS — K219 Gastro-esophageal reflux disease without esophagitis: Secondary | ICD-10-CM | POA: Insufficient documentation

## 2017-12-01 DIAGNOSIS — O26899 Other specified pregnancy related conditions, unspecified trimester: Secondary | ICD-10-CM

## 2017-12-01 DIAGNOSIS — Z3480 Encounter for supervision of other normal pregnancy, unspecified trimester: Secondary | ICD-10-CM

## 2017-12-01 DIAGNOSIS — R102 Pelvic and perineal pain: Secondary | ICD-10-CM | POA: Diagnosis present

## 2017-12-01 DIAGNOSIS — Z3A21 21 weeks gestation of pregnancy: Secondary | ICD-10-CM | POA: Diagnosis not present

## 2017-12-01 DIAGNOSIS — O99212 Obesity complicating pregnancy, second trimester: Secondary | ICD-10-CM | POA: Diagnosis not present

## 2017-12-01 DIAGNOSIS — O99612 Diseases of the digestive system complicating pregnancy, second trimester: Secondary | ICD-10-CM | POA: Diagnosis not present

## 2017-12-01 DIAGNOSIS — Z7982 Long term (current) use of aspirin: Secondary | ICD-10-CM | POA: Diagnosis not present

## 2017-12-01 DIAGNOSIS — M549 Dorsalgia, unspecified: Secondary | ICD-10-CM | POA: Insufficient documentation

## 2017-12-01 DIAGNOSIS — O26892 Other specified pregnancy related conditions, second trimester: Secondary | ICD-10-CM | POA: Diagnosis not present

## 2017-12-01 DIAGNOSIS — O99210 Obesity complicating pregnancy, unspecified trimester: Secondary | ICD-10-CM

## 2017-12-01 DIAGNOSIS — Z8632 Personal history of gestational diabetes: Secondary | ICD-10-CM | POA: Diagnosis not present

## 2017-12-01 DIAGNOSIS — O9989 Other specified diseases and conditions complicating pregnancy, childbirth and the puerperium: Secondary | ICD-10-CM

## 2017-12-01 DIAGNOSIS — O99342 Other mental disorders complicating pregnancy, second trimester: Secondary | ICD-10-CM | POA: Insufficient documentation

## 2017-12-01 DIAGNOSIS — O99512 Diseases of the respiratory system complicating pregnancy, second trimester: Secondary | ICD-10-CM | POA: Diagnosis not present

## 2017-12-01 DIAGNOSIS — J45909 Unspecified asthma, uncomplicated: Secondary | ICD-10-CM | POA: Diagnosis not present

## 2017-12-01 DIAGNOSIS — R51 Headache: Secondary | ICD-10-CM | POA: Diagnosis not present

## 2017-12-01 DIAGNOSIS — O09299 Supervision of pregnancy with other poor reproductive or obstetric history, unspecified trimester: Secondary | ICD-10-CM

## 2017-12-01 DIAGNOSIS — Z8249 Family history of ischemic heart disease and other diseases of the circulatory system: Secondary | ICD-10-CM | POA: Insufficient documentation

## 2017-12-01 DIAGNOSIS — Z79899 Other long term (current) drug therapy: Secondary | ICD-10-CM | POA: Insufficient documentation

## 2017-12-01 DIAGNOSIS — F329 Major depressive disorder, single episode, unspecified: Secondary | ICD-10-CM | POA: Diagnosis not present

## 2017-12-01 DIAGNOSIS — Z888 Allergy status to other drugs, medicaments and biological substances status: Secondary | ICD-10-CM | POA: Diagnosis not present

## 2017-12-01 LAB — URINALYSIS, ROUTINE W REFLEX MICROSCOPIC
Bilirubin Urine: NEGATIVE
GLUCOSE, UA: 150 mg/dL — AB
HGB URINE DIPSTICK: NEGATIVE
Ketones, ur: NEGATIVE mg/dL
NITRITE: NEGATIVE
PH: 5 (ref 5.0–8.0)
Protein, ur: NEGATIVE mg/dL
SPECIFIC GRAVITY, URINE: 1.02 (ref 1.005–1.030)

## 2017-12-01 LAB — WET PREP, GENITAL
Clue Cells Wet Prep HPF POC: NONE SEEN
SPERM: NONE SEEN
Trich, Wet Prep: NONE SEEN

## 2017-12-01 MED ORDER — METOCLOPRAMIDE HCL 5 MG/ML IJ SOLN
10.0000 mg | Freq: Once | INTRAMUSCULAR | Status: AC
  Administered 2017-12-01: 10 mg via INTRAVENOUS
  Filled 2017-12-01: qty 2

## 2017-12-01 MED ORDER — DEXAMETHASONE SODIUM PHOSPHATE 10 MG/ML IJ SOLN
10.0000 mg | Freq: Once | INTRAMUSCULAR | Status: AC
  Administered 2017-12-01: 10 mg via INTRAVENOUS
  Filled 2017-12-01: qty 1

## 2017-12-01 MED ORDER — CYCLOBENZAPRINE HCL 10 MG PO TABS: 10.0000 mg | ORAL_TABLET | Freq: Three times a day (TID) | ORAL | Status: DC | PRN

## 2017-12-01 MED ORDER — LACTATED RINGERS IV BOLUS
1000.0000 mL | Freq: Once | INTRAVENOUS | Status: AC
  Administered 2017-12-01: 1000 mL via INTRAVENOUS

## 2017-12-01 MED ORDER — CYCLOBENZAPRINE HCL 10 MG PO TABS: 10.0000 mg | ORAL_TABLET | Freq: Three times a day (TID) | ORAL | 0 refills | Status: DC | PRN

## 2017-12-01 MED ORDER — DIPHENHYDRAMINE HCL 50 MG/ML IJ SOLN: 25.0000 mg | Freq: Once | INTRAMUSCULAR | Status: DC

## 2017-12-01 NOTE — MAU Provider Note (Addendum)
History     CSN: [id number]  Arrival date and time: 12/01/17 [id number]   None     Chief Complaint  Patient presents with  . Abdominal Pain  . Back Pain  . Headache   Laurie Reed is a 32 yo [phone] who is [id number] presenting with a HA, back pain, and abdominal cramping. Hx of back muscle spasms and round ligament pain. She states that she has had the HA for a week now and thinks that it is coming from mold in her apt. She has been taking extra strength tylenol every 6 hours and Fiorcet at night for her HA which makes the HA go away but then when the medicine wears off she gets the HA again. Her back pain started today and she rates it an 8/10 on pain scale. Her Cramping abdominal pain started yesterday and she states that it feels like pressure down below. Denies any urinary symptoms, vaginal bleeding, vaginal discharge, or LOF. Confirms some blurry vision yesterday that spontaneously resolved on its own. She says she does drink water throughout the day.       OB History    Gravida  7   Para  3   Term  3   Preterm  0   AB  3   Living  3     SAB  3   TAB  0   Ectopic  0   Multiple  0   Live Births  3           Past Medical History:  Diagnosis Date  . Acid reflux 2004  . Anemia   . Anxiety 2008  . Asthma 1993   no previous intubations or hospitalizations   . Chlamydia   . Depression 2008   no meds, currenly ok  . Environmental allergies   . Migraines   . Miscarriage   . Ovarian cyst   . Pre-diabetes   . Ulcer of the stomach and intestine 2004  . Urinary tract infection     Past Surgical History:  Procedure Laterality Date  . COLONOSCOPY  2004   . IRRIGATION AND DEBRIDEMENT SEBACEOUS CYST N/A 06/01/2017   Procedure: EXCISION SEBACEOUS CYST ON SCALP;  Surgeon: Ancil Linseyavis, Jason Evan, MD;  Location: [facility] ORS;  Service: General;  Laterality: N/A;  . MULTIPLE TOOTH EXTRACTIONS  2015   6 teeth removed   . WISDOM TOOTH EXTRACTION  2007    Family History   Problem Relation Age of Onset  . Asthma Mother   . Heart disease Mother   . Hypertension Mother   . Asthma Maternal Aunt   . Hypertension Maternal Aunt   . Diabetes Maternal Aunt   . Asthma Maternal Uncle   . Hypertension Maternal Uncle   . Diabetes Maternal Uncle   . Heart disease Maternal Grandmother   . Diabetes Maternal Grandmother   . Hypertension Maternal Grandmother   . Heart disease Maternal Grandfather   . Diabetes Maternal Grandfather   . Hypertension Maternal Grandfather   . Hypertension Paternal Grandmother   . Hypotension Neg Hx   . Anesthesia problems Neg Hx   . Malignant hyperthermia Neg Hx   . Pseudochol deficiency Neg Hx   . Cancer Neg Hx     Social History   Tobacco Use  . Smoking status: Never Smoker  . Smokeless tobacco: Never Used  Substance Use Topics  . Alcohol use: No    Alcohol/week: 0.0 oz  . Drug use: No    Allergies:  Allergies  Allergen Reactions  . Other Anaphylaxis    "20 different types of trees"  . Latex Hives and Itching  . Zofran Itching    Medications Prior to Admission  Medication Sig Dispense Refill Last Dose  . acetaminophen (TYLENOL) 500 MG tablet Take 500 mg by mouth every 6 (six) hours as needed.   Taking  . aspirin 81 MG chewable tablet Chew 1 tablet (81 mg total) by mouth daily. 30 tablet 4   . butalbital-acetaminophen-caffeine (FIORICET, ESGIC) 50-325-40 MG tablet Take 1 tablet by mouth every 8 (eight) hours as needed for headache. 20 tablet 0 Taking  . cetirizine (ZYRTEC) 10 MG tablet Take 1 tablet (10 mg total) by mouth daily. 30 tablet 0   . Elastic Bandages & Supports (COMFORT FIT MATERNITY SUPP LG) MISC 1 Units by Does not apply route daily. 1 each 0 Taking  . famotidine (PEPCID) 20 MG tablet Take 1 tablet (20 mg total) by mouth 2 (two) times daily. 60 tablet 3   . loratadine (CLARITIN) 10 MG tablet Take 10 mg by mouth daily.   Taking  . nystatin (MYCOSTATIN/NYSTOP) powder Apply topically 4 (four) times daily. 45  g 0 Taking  . Prenatal Multivit-Min-Fe-FA (PRENATAL VITAMINS) 0.8 MG tablet Take 1 tablet by mouth daily. 30 tablet 12 Taking    Review of Systems  HENT: Positive for congestion and rhinorrhea.   Genitourinary: Positive for pelvic pain. Negative for difficulty urinating, dysuria, vaginal bleeding, vaginal discharge and vaginal pain.  Musculoskeletal: Positive for back pain.  Neurological: Positive for headaches.   Physical Exam   Blood pressure 115/60, pulse 92, temperature 98.8 F (37.1 C), temperature source Oral, resp. rate 18, weight 115 kg (253 lb 8 oz), last menstrual period 05/01/2017, SpO2 100 %, unknown if currently breastfeeding.  Physical Exam  Constitutional: She is oriented to person, place, and time. She appears well-developed and well-nourished. No distress.  HENT:  Head: Normocephalic and atraumatic.  Eyes: Conjunctivae are normal.  Cardiovascular: Normal rate and normal heart sounds.  Respiratory: Effort normal and breath sounds normal. No respiratory distress.  GI: Soft. She exhibits no distension. There is tenderness. There is no rebound and no guarding.  Gravid abdomen, Mild TTP along lower abdominal quadrants  Musculoskeletal: Normal range of motion. She exhibits tenderness.  Bilateral paraspinal TTP in thoracic spine, Trace pitting edema over BLE  Neurological: She is alert and oriented to person, place, and time.  Skin: Skin is warm and dry.    MAU Course  Procedures  UA Wet Prep G/C Speculum exam   MDM HA could be from mold or rebound HA from taking the tylenol and fiorcet regularly. Pt driving herself so will give her IV fluids and decadron for HA. Back pain most likely of MSK origin due to TTP over paraspinal muscles. Will give flexeril Rx to go home with since she is driving herself. Abdominal pain is most likely due to enlarging uterus and discomforts of being a multip pregnancy but recommended she wear her pregnancy belt if she can. Pt received  fluids, reglan and decadron. Wet prep + yeast. Pt stable to go home.  Assessment and Plan   1. Obesity affecting pregnancy, antepartum   2. H/O gestational diabetes in prior pregnancy, currently pregnant   3. Supervision of other normal pregnancy, antepartum     -D/C home -given information on Back pain in pregnancy -Given information on Round ligament pain -Advised to stop taking the tylenol every 6 hours and tried to explain  that this can cause rebound headaches -Rx flexeril  Rolland Bimler MS3 12/01/2017, 8:51 AM

## 2017-12-01 NOTE — Discharge Instructions (Signed)
Back Pain in Pregnancy Back pain during pregnancy is common. Back pain may be caused by several factors that are related to changes during your pregnancy. Follow these instructions at home: Managing pain, stiffness, and swelling  If directed, apply ice for sudden (acute) back pain. ? Put ice in a plastic bag. ? Place a towel between your skin and the bag. ? Leave the ice on for 20 minutes, 2-3 times per day.  If directed, apply heat to the affected area before you exercise: ? Place a towel between your skin and the heat pack or heating pad. ? Leave the heat on for 20-30 minutes. ? Remove the heat if your skin turns bright red. This is especially important if you are unable to feel pain, heat, or cold. You may have a greater risk of getting burned. Activity  Exercise as told by your health care provider. Exercising is the best way to prevent or manage back pain.  Listen to your body when lifting. If lifting hurts, ask for help or bend your knees. This uses your leg muscles instead of your back muscles.  Squat down when picking up something from the floor. Do not bend over.  Only use bed rest as told by your health care provider. Bed rest should only be used for the most severe episodes of back pain. Standing, Sitting, and Lying Down  Do not stand in one place for long periods of time.  Use good posture when sitting. Make sure your head rests over your shoulders and is not hanging forward. Use a pillow on your lower back if necessary.  Try sleeping on your side, preferably the left side, with a pillow or two between your legs. If you are sore after a night's rest, your bed may be too soft. A firm mattress may provide more support for your back during pregnancy. General instructions  Do not wear high heels.  Eat a healthy diet. Try to gain weight within your health care provider's recommendations.  Use a maternity girdle, elastic sling, or back brace as told by your health care  provider.  Take over-the-counter and prescription medicines only as told by your health care provider.  Keep all follow-up visits as told by your health care provider. This is important. This includes any visits with any specialists, such as a physical therapist. Contact a health care provider if:  Your back pain interferes with your daily activities.  You have increasing pain in other parts of your body. Get help right away if:  You develop numbness, tingling, weakness, or problems with the use of your arms or legs.  You develop severe back pain that is not controlled with medicine.  You have a sudden change in bowel or bladder control.  You develop shortness of breath, dizziness, or you faint.  You develop nausea, vomiting, or sweating.  You have back pain that is a rhythmic, cramping pain similar to labor pains. Labor pain is usually 1-2 minutes apart, lasts for about 1 minute, and involves a bearing down feeling or pressure in your pelvis.  You have back pain and your water breaks or you have vaginal bleeding.  You have back pain or numbness that travels down your leg.  Your back pain developed after you fell.  You develop pain on one side of your back.  You see blood in your urine.  You develop skin blisters in the area of your back pain. This information is not intended to replace advice given to you   by your health care provider. Make sure you discuss any questions you have with your health care provider. Document Released: 08/11/2005 Document Revised: 10/09/2015 Document Reviewed: 01/15/2015 Elsevier Interactive Patient Education  2018 [employer]. A headache is pain or discomfort felt around the head or neck area. There are many causes and types of headaches. In some cases, the cause may not be found. Follow these instructions at home: Managing pain  Take over-the-counter and prescription medicines only as told by your doctor.  Lie down in a dark, quiet room when  you have a headache.  If directed, apply ice to the head and neck area: ? Put ice in a plastic bag. ? Place a towel between your skin and the bag. ? Leave the ice on for 20 minutes, 2-3 times per day.  Use a heating pad or hot shower to apply heat to the head and neck area as told by your doctor.  Keep lights dim if bright lights bother you or make your headaches worse. Eating and drinking  Eat meals on a regular schedule.  Lessen how much alcohol you drink.  Lessen how much caffeine you drink, or stop drinking caffeine. General instructions  Keep all follow-up visits as told by your doctor. This is important.  Keep a journal to find out if certain things bring on headaches. For example, write down: ? What you eat and drink. ? How much sleep you get. ? Any change to your diet or medicines.  Relax by getting a massage or doing other relaxing activities.  Lessen stress.  Sit up straight. Do not tighten (tense) your muscles.  Do not use tobacco products. This includes cigarettes, chewing tobacco, or e-cigarettes. If you need help quitting, ask your doctor.  Exercise regularly as told by your doctor.  Get enough sleep. This often means 7-9 hours of sleep. Contact a doctor if:  Your symptoms are not helped by medicine.  You have a headache that feels different than the other headaches.  You feel sick to your stomach (nauseous) or you throw up (vomit).  You have a fever. Get help right away if:  Your headache becomes really bad.  You keep throwing up.  You have a stiff neck.  You have trouble seeing.  You have trouble speaking.  You have pain in the eye or ear.  Your muscles are weak or you lose muscle control.  You lose your balance or have trouble walking.  You feel like you will pass out (faint) or you pass out.  You have confusion. This information is not intended to replace advice given to you by your health care provider. Make sure you discuss any  questions you have with your health care provider. Document Released: 02/10/2008 Document Revised: 10/09/2015 Document Reviewed: 08/26/2014 Elsevier Interactive Patient Education  2018 [employer]   General Headache Without Cause Round Ligament Pain The round ligament is a cord of muscle and tissue that helps to support the uterus. It can become a source of pain during pregnancy if it becomes stretched or twisted as the baby grows. The pain usually begins in the second trimester of pregnancy, and it can come and go until the baby is delivered. It is not a serious problem, and it does not cause harm to the baby. Round ligament pain is usually a short, sharp, and pinching pain, but it can also be a dull, lingering, and aching pain. The pain is felt in the lower side of the abdomen or in the  groin. It usually starts deep in the groin and moves up to the outside of the hip area. Pain can occur with:  A sudden change in position.  Rolling over in bed.  Coughing or sneezing.  Physical activity.  Follow these instructions at home: Watch your condition for any changes. Take these steps to help with your pain:  When the pain starts, relax. Then try: ? Sitting down. ? Flexing your knees up to your abdomen. ? Lying on your side with one pillow under your abdomen and another pillow between your legs. ? Sitting in a warm bath for 15-20 minutes or until the pain goes away.  Take over-the-counter and prescription medicines only as told by your health care provider.  Move slowly when you sit and stand.  Avoid long walks if they cause pain.  Stop or lessen your physical activities if they cause pain.  Contact a health care provider if:  Your pain does not go away with treatment.  You feel pain in your back that you did not have before.  Your medicine is not helping. Get help right away if:  You develop a fever or chills.  You develop uterine contractions.  You develop vaginal  bleeding.  You develop nausea or vomiting.  You develop diarrhea.  You have pain when you urinate. This information is not intended to replace advice given to you by your health care provider. Make sure you discuss any questions you have with your health care provider. Document Released: 02/10/2008 Document Revised: 10/09/2015 Document Reviewed: 07/10/2014 Elsevier Interactive Patient Education  2018 [employer]. A headache is pain or discomfort felt around the head or neck area. There are many causes and types of headaches. In some cases, the cause may not be found. Follow these instructions at home: Managing pain  Take over-the-counter and prescription medicines only as told by your doctor.  Lie down in a dark, quiet room when you have a headache.  If directed, apply ice to the head and neck area: ? Put ice in a plastic bag. ? Place a towel between your skin and the bag. ? Leave the ice on for 20 minutes, 2-3 times per day.  Use a heating pad or hot shower to apply heat to the head and neck area as told by your doctor.  Keep lights dim if bright lights bother you or make your headaches worse. Eating and drinking  Eat meals on a regular schedule.  Lessen how much alcohol you drink.  Lessen how much caffeine you drink, or stop drinking caffeine. General instructions  Keep all follow-up visits as told by your doctor. This is important.  Keep a journal to find out if certain things bring on headaches. For example, write down: ? What you eat and drink. ? How much sleep you get. ? Any change to your diet or medicines.  Relax by getting a massage or doing other relaxing activities.  Lessen stress.  Sit up straight. Do not tighten (tense) your muscles.  Do not use tobacco products. This includes cigarettes, chewing tobacco, or e-cigarettes. If you need help quitting, ask your doctor.  Exercise regularly as told by your doctor.  Get enough sleep. This often means 7-9  hours of sleep. Contact a doctor if:  Your symptoms are not helped by medicine.  You have a headache that feels different than the other headaches.  You feel sick to your stomach (nauseous) or you throw up (vomit).  You have a fever. Get help  right away if:  Your headache becomes really bad.  You keep throwing up.  You have a stiff neck.  You have trouble seeing.  You have trouble speaking.  You have pain in the eye or ear.  Your muscles are weak or you lose muscle control.  You lose your balance or have trouble walking.  You feel like you will pass out (faint) or you pass out.  You have confusion. This information is not intended to replace advice given to you by your health care provider. Make sure you discuss any questions you have with your health care provider. Document Released: 02/10/2008 Document Revised: 10/09/2015 Document Reviewed: 08/26/2014 Elsevier Interactive Patient Education  [employer]2018 [employer]

## 2017-12-01 NOTE — MAU Provider Note (Addendum)
History     CSN: [id number]  Arrival date and time: 12/01/17 4098   First Provider Initiated Contact with Patient 12/01/17 [phone]      Chief Complaint  Patient presents with  . Abdominal Pain  . Back Pain  . Headache   [id number] @21 .2 wks here with HA, back pain, and abd cramping. HA onset was 1 week ago and she thinks it's coming from recently found mold in her apartment. She has notified her landlord to rectify this. No associated sx. No aura. She has been taking XS Tylenol q6 hrs and Fioricet at hs but HA returns after meds wear off. Back pain started today and rates 8/10. No recent lifting , injury, or strenuous activity. No urinary sx. Abd cramping is lower and bilateral. No VB or discharge.    OB History    Gravida  7   Para  3   Term  3   Preterm  0   AB  3   Living  3     SAB  3   TAB  0   Ectopic  0   Multiple  0   Live Births  3           Past Medical History:  Diagnosis Date  . Acid reflux 2004  . Anemia   . Anxiety 2008  . Asthma 1993   no previous intubations or hospitalizations   . Chlamydia   . Depression 2008   no meds, currenly ok  . Environmental allergies   . Migraines   . Miscarriage   . Ovarian cyst   . Pre-diabetes   . Ulcer of the stomach and intestine 2004  . Urinary tract infection     Past Surgical History:  Procedure Laterality Date  . COLONOSCOPY  2004   . IRRIGATION AND DEBRIDEMENT SEBACEOUS CYST N/A 06/01/2017   Procedure: EXCISION SEBACEOUS CYST ON SCALP;  Surgeon: Ancil Linsey, MD;  Location: [facility] ORS;  Service: General;  Laterality: N/A;  . MULTIPLE TOOTH EXTRACTIONS  2015   6 teeth removed   . WISDOM TOOTH EXTRACTION  2007    Family History  Problem Relation Age of Onset  . Asthma Mother   . Heart disease Mother   . Hypertension Mother   . Asthma Maternal Aunt   . Hypertension Maternal Aunt   . Diabetes Maternal Aunt   . Asthma Maternal Uncle   . Hypertension Maternal Uncle   . Diabetes Maternal  Uncle   . Heart disease Maternal Grandmother   . Diabetes Maternal Grandmother   . Hypertension Maternal Grandmother   . Heart disease Maternal Grandfather   . Diabetes Maternal Grandfather   . Hypertension Maternal Grandfather   . Hypertension Paternal Grandmother   . Hypotension Neg Hx   . Anesthesia problems Neg Hx   . Malignant hyperthermia Neg Hx   . Pseudochol deficiency Neg Hx   . Cancer Neg Hx     Social History   Tobacco Use  . Smoking status: Never Smoker  . Smokeless tobacco: Never Used  Substance Use Topics  . Alcohol use: No    Alcohol/week: 0.0 oz  . Drug use: No    Allergies:  Allergies  Allergen Reactions  . Other Anaphylaxis    "20 different types of trees"  . Latex Hives and Itching  . Zofran Itching    Medications Prior to Admission  Medication Sig Dispense Refill Last Dose  . acetaminophen (TYLENOL) 500 MG tablet Take 500 mg by  mouth every 6 (six) hours as needed.   Taking  . aspirin 81 MG chewable tablet Chew 1 tablet (81 mg total) by mouth daily. 30 tablet 4   . butalbital-acetaminophen-caffeine (FIORICET, ESGIC) 50-325-40 MG tablet Take 1 tablet by mouth every 8 (eight) hours as needed for headache. 20 tablet 0 Taking  . cetirizine (ZYRTEC) 10 MG tablet Take 1 tablet (10 mg total) by mouth daily. 30 tablet 0   . Elastic Bandages & Supports (COMFORT FIT MATERNITY SUPP LG) MISC 1 Units by Does not apply route daily. 1 each 0 Taking  . famotidine (PEPCID) 20 MG tablet Take 1 tablet (20 mg total) by mouth 2 (two) times daily. 60 tablet 3   . loratadine (CLARITIN) 10 MG tablet Take 10 mg by mouth daily.   Taking  . nystatin (MYCOSTATIN/NYSTOP) powder Apply topically 4 (four) times daily. 45 g 0 Taking  . Prenatal Multivit-Min-Fe-FA (PRENATAL VITAMINS) 0.8 MG tablet Take 1 tablet by mouth daily. 30 tablet 12 Taking    Review of Systems  Constitutional: Negative for chills and fever.  Eyes: Negative for visual disturbance.  Gastrointestinal: Positive  for abdominal pain. Negative for nausea and rectal pain.  Genitourinary: Negative for dysuria, vaginal bleeding and vaginal discharge.  Musculoskeletal: Positive for back pain.  Neurological: Positive for headaches.   Physical Exam   Blood pressure 115/60, pulse 92, temperature 98.8 F (37.1 C), temperature source Oral, resp. rate 18, weight 253 lb 8 oz (115 kg), last menstrual period 05/01/2017, SpO2 100 %, unknown if currently breastfeeding.  Physical Exam  Constitutional: She is oriented to person, place, and time. She appears well-developed and well-nourished. No distress.  HENT:  Head: Normocephalic and atraumatic.  Neck: Normal range of motion.  Cardiovascular: Normal rate.  Respiratory: Effort normal. No respiratory distress.  GI: Soft. She exhibits no distension and no mass. There is tenderness in the right lower quadrant and left lower quadrant. There is no rebound and no guarding.  Genitourinary:  Genitourinary Comments: External: no lesions or erythema Vagina: rugated, pink, moist, small thin white curdy discharge Cervix closed/long   Musculoskeletal: Normal range of motion.       Cervical back: Normal.       Thoracic back: She exhibits tenderness.       Lumbar back: Normal.  Neurological: She is alert and oriented to person, place, and time.  Skin: Skin is warm and dry.  Psychiatric: She has a normal mood and affect.   Results for orders placed or performed during the hospital encounter of 12/01/17 (from the past 24 hour(s))  Urinalysis, Routine w reflex microscopic     Status: Abnormal   Collection Time: 12/01/17  8:48 AM  Result Value Ref Range   Color, Urine YELLOW YELLOW   APPearance HAZY (A) CLEAR   Specific Gravity, Urine 1.020 1.005 - 1.030   pH 5.0 5.0 - 8.0   Glucose, UA 150 (A) NEGATIVE mg/dL   Hgb urine dipstick NEGATIVE NEGATIVE   Bilirubin Urine NEGATIVE NEGATIVE   Ketones, ur NEGATIVE NEGATIVE mg/dL   Protein, ur NEGATIVE NEGATIVE mg/dL   Nitrite  NEGATIVE NEGATIVE   Leukocytes, UA TRACE (A) NEGATIVE   RBC / HPF 0-5 0 - 5 RBC/hpf   WBC, UA 0-5 0 - 5 WBC/hpf   Bacteria, UA RARE (A) NONE SEEN   Squamous Epithelial / LPF 6-10 0 - 5   Mucus PRESENT   Wet prep, genital     Status: Abnormal   Collection Time: 12/01/17  10:13 AM  Result Value Ref Range   Yeast Wet Prep HPF POC PRESENT (A) NONE SEEN   Trich, Wet Prep NONE SEEN NONE SEEN   Clue Cells Wet Prep HPF POC NONE SEEN NONE SEEN   WBC, Wet Prep HPF POC MODERATE (A) NONE SEEN   Sperm NONE SEEN    MAU Course  Procedures LR Reglan Decadron  MDM Labs ordered and reviewed. Yeast on wet prep but asymptomatic. Could not offer Benadryl for HA cocktail and Flexeril because pt is driving herself. Will provide Rx for Flexeril. No evidence of UTI or PTL. Sleeping after meds. HA resolved after meds. No further c/o of abd or back pain. Stable for discharge home.  Assessment and Plan  [redacted] weeks gestation RL pain Back pain in pregnancy Headache in pregnancy  Discharge home Follow up in OB office as scheduled next week Rx Flexeril Maternity belt prn  Allergies as of 12/01/2017      Reactions   Other Anaphylaxis   "20 different types of trees"   Latex Hives, Itching   Zofran Itching      Medication List    TAKE these medications   acetaminophen 500 MG tablet Commonly known as:  TYLENOL Take 500 mg by mouth every 6 (six) hours as needed.   aspirin 81 MG chewable tablet Chew 1 tablet (81 mg total) by mouth daily.   butalbital-acetaminophen-caffeine 50-325-40 MG tablet Commonly known as:  FIORICET, ESGIC Take 1 tablet by mouth every 8 (eight) hours as needed for headache.   cetirizine 10 MG tablet Commonly known as:  ZYRTEC Take 1 tablet (10 mg total) by mouth daily.   COMFORT FIT MATERNITY SUPP LG Misc 1 Units by Does not apply route daily.   cyclobenzaprine 10 MG tablet Commonly known as:  FLEXERIL Take 1 tablet (10 mg total) by mouth every 8 (eight) hours as needed  for muscle spasms.   famotidine 20 MG tablet Commonly known as:  PEPCID Take 1 tablet (20 mg total) by mouth 2 (two) times daily.   loratadine 10 MG tablet Commonly known as:  CLARITIN Take 10 mg by mouth daily.   nystatin powder Commonly known as:  MYCOSTATIN/NYSTOP Apply topically 4 (four) times daily.   Prenatal Vitamins 0.8 MG tablet Take 1 tablet by mouth daily.      Donette Larry, CNM 12/01/2017, 10:17 AM

## 2017-12-01 NOTE — MAU Note (Signed)
For over a wk has been having these headaches.  Takes meds, they work, but then the HA comes back.  Been cramping in lower abd, low back has started hurting.

## 2017-12-01 NOTE — Progress Notes (Signed)
Pt states her HA is gone, hasn't been moving around so she doesn't know if her abdomen & back are still hurting.

## 2017-12-02 LAB — GC/CHLAMYDIA PROBE AMP (~~LOC~~) NOT AT ARMC
Chlamydia: NEGATIVE
NEISSERIA GONORRHEA: NEGATIVE

## 2017-12-07 ENCOUNTER — Encounter: Payer: Self-pay | Admitting: Student

## 2017-12-07 ENCOUNTER — Ambulatory Visit (INDEPENDENT_AMBULATORY_CARE_PROVIDER_SITE_OTHER): Payer: Medicaid Other | Admitting: Student

## 2017-12-07 VITALS — BP 122/79 | HR 101 | Wt 252.0 lb

## 2017-12-07 DIAGNOSIS — Z3480 Encounter for supervision of other normal pregnancy, unspecified trimester: Secondary | ICD-10-CM

## 2017-12-07 DIAGNOSIS — B3731 Acute candidiasis of vulva and vagina: Secondary | ICD-10-CM

## 2017-12-07 DIAGNOSIS — B373 Candidiasis of vulva and vagina: Secondary | ICD-10-CM

## 2017-12-07 MED ORDER — CETIRIZINE HCL 10 MG PO TABS: 10.0000 mg | ORAL_TABLET | Freq: Every day | ORAL | 0 refills | Status: DC

## 2017-12-07 MED ORDER — TERCONAZOLE 0.8 % VA CREA: 1.0000 | TOPICAL_CREAM | Freq: Every day | VAGINAL | 0 refills | Status: DC

## 2017-12-07 NOTE — Patient Instructions (Signed)
Eating Plan for Pregnant Women While you are pregnant, your body will require additional nutrition to help support your growing baby. It is recommended that you consume:  150 additional calories each day during your first trimester.  300 additional calories each day during your second trimester.  300 additional calories each day during your third trimester.  Eating a healthy, well-balanced diet is very important for your health and for your baby's health. You also have a higher need for some vitamins and minerals, such as folic acid, calcium, iron, and vitamin D. What do I need to know about eating during pregnancy?  Do not try to lose weight or go on a diet during pregnancy.  Choose healthy, nutritious foods. Choose  of a sandwich with a glass of milk instead of a candy bar or a high-calorie sugar-sweetened beverage.  Limit your overall intake of foods that have "empty calories." These are foods that have little nutritional value, such as sweets, desserts, candies, sugar-sweetened beverages, and fried foods.  Eat a variety of foods, especially fruits and vegetables.  Take a prenatal vitamin to help meet the additional needs during pregnancy, specifically for folic acid, iron, calcium, and vitamin D.  Remember to stay active. Ask your health care provider for exercise recommendations that are specific to you.  Practice good food safety and cleanliness, such as washing your hands before you eat and after you prepare raw meat. This helps to prevent foodborne illnesses, such as listeriosis, that can be very dangerous for your baby. Ask your health care provider for more information about listeriosis. What does 150 extra calories look like? Healthy options for an additional 150 calories each day could be any of the following:  Plain low-fat yogurt (6-8 oz) with  cup of berries.  1 apple with 2 teaspoons of peanut butter.  Cut-up vegetables with  cup of hummus.  Low-fat chocolate  milk (8 oz or 1 cup).  1 string cheese with 1 medium orange.   of a peanut butter and jelly sandwich on whole-wheat bread (1 tsp of peanut butter).  For 300 calories, you could eat two of those healthy options each day. What is a healthy amount of weight to gain? The recommended amount of weight for you to gain is based on your pre-pregnancy BMI. If your pre-pregnancy BMI was:  Less than 18 (underweight), you should gain 28-40 lb.  18-24.9 (normal), you should gain 25-35 lb.  25-29.9 (overweight), you should gain 15-25 lb.  Greater than 30 (obese), you should gain 11-20 lb.  What if I am having twins or multiples? Generally, pregnant women who will be having twins or multiples may need to increase their daily calories by 300-600 calories each day. The recommended range for total weight gain is 25-54 lb, depending on your pre-pregnancy BMI. Talk with your health care provider for specific guidance about additional nutritional needs, weight gain, and exercise during your pregnancy. What foods can I eat? Grains Any grains. Try to choose whole grains, such as whole-wheat bread, oatmeal, or brown rice. Vegetables Any vegetables. Try to eat a variety of colors and types of vegetables to get a full range of vitamins and minerals. Remember to wash your vegetables well before eating. Fruits Any fruits. Try to eat a variety of colors and types of fruit to get a full range of vitamins and minerals. Remember to wash your fruits well before eating. Meats and Other Protein Sources Lean meats, including chicken, turkey, fish, and lean cuts of beef, veal,   or pork. Make sure that all meats are cooked to "well done." Tofu. Tempeh. Beans. Eggs. Peanut butter and other nut butters. Seafood, such as shrimp, crab, and lobster. If you choose fish, select types that are higher in omega-3 fatty acids, including salmon, herring, mussels, trout, sardines, and pollock. Make sure that all meats are cooked to  food-safe temperatures. Dairy Pasteurized milk and milk alternatives. Pasteurized yogurt and pasteurized cheese. Cottage cheese. Sour cream. Beverages Water. Juices that contain 100% fruit juice or vegetable juice. Caffeine-free teas and decaffeinated coffee. Drinks that contain caffeine are okay to drink, but it is better to avoid caffeine. Keep your total caffeine intake to less than 200 mg each day (12 oz of coffee, tea, or soda) or as directed by your health care provider. Condiments Any pasteurized condiments. Sweets and Desserts Any sweets and desserts. Fats and Oils Any fats and oils. The items listed above may not be a complete list of recommended foods or beverages. Contact your dietitian for more options. What foods are not recommended? Vegetables Unpasteurized (raw) vegetable juices. Fruits Unpasteurized (raw) fruit juices. Meats and Other Protein Sources Cured meats that have nitrates, such as bacon, salami, and hotdogs. Luncheon meats, bologna, or other deli meats (unless they are reheated until they are steaming hot). Refrigerated pate, meat spreads from a meat counter, smoked seafood that is found in the refrigerated section of a store. Raw fish, such as sushi or sashimi. High mercury content fish, such as tilefish, shark, swordfish, and king mackerel. Raw meats, such as tuna or beef tartare. Undercooked meats and poultry. Make sure that all meats are cooked to food-safe temperatures. Dairy Unpasteurized (raw) milk and any foods that have raw milk in them. Soft cheeses, such as feta, queso blanco, queso fresco, Brie, Camembert cheeses, blue-veined cheeses, and Panela cheese (unless it is made with pasteurized milk, which must be stated on the label). Beverages Alcohol. Sugar-sweetened beverages, such as sodas, teas, or energy drinks. Condiments Homemade fermented foods and drinks, such as pickles, sauerkraut, or kombucha drinks. (Store-bought pasteurized versions of these are  okay.) Other Salads that are made in the store, such as ham salad, chicken salad, egg salad, tuna salad, and seafood salad. The items listed above may not be a complete list of foods and beverages to avoid. Contact your dietitian for more information. This information is not intended to replace advice given to you by your health care provider. Make sure you discuss any questions you have with your health care provider. Document Released: 02/15/2014 Document Revised: 10/09/2015 Document Reviewed: 10/16/2013 Elsevier Interactive Patient Education  2018 [employer]   

## 2017-12-15 ENCOUNTER — Ambulatory Visit (HOSPITAL_COMMUNITY)
Admission: RE | Admit: 2017-12-15 | Discharge: 2017-12-15 | Disposition: A | Discharge status: Home / Self Care | Payer: Medicaid Other | Source: Ambulatory Visit | Attending: Student | Admitting: Student

## 2017-12-15 ENCOUNTER — Other Ambulatory Visit: Payer: Self-pay | Admitting: Student

## 2017-12-15 DIAGNOSIS — IMO0002 Reserved for concepts with insufficient information to code with codable children: Secondary | ICD-10-CM

## 2017-12-15 DIAGNOSIS — O99212 Obesity complicating pregnancy, second trimester: Secondary | ICD-10-CM | POA: Diagnosis not present

## 2017-12-15 DIAGNOSIS — Z3480 Encounter for supervision of other normal pregnancy, unspecified trimester: Secondary | ICD-10-CM

## 2017-12-15 DIAGNOSIS — O09293 Supervision of pregnancy with other poor reproductive or obstetric history, third trimester: Secondary | ICD-10-CM

## 2017-12-15 DIAGNOSIS — Z3A23 23 weeks gestation of pregnancy: Secondary | ICD-10-CM | POA: Insufficient documentation

## 2017-12-15 DIAGNOSIS — Z0489 Encounter for examination and observation for other specified reasons: Secondary | ICD-10-CM | POA: Insufficient documentation

## 2017-12-15 DIAGNOSIS — Z362 Encounter for other antenatal screening follow-up: Secondary | ICD-10-CM

## 2018-01-03 ENCOUNTER — Other Ambulatory Visit: Payer: Self-pay

## 2018-01-03 DIAGNOSIS — Z3480 Encounter for supervision of other normal pregnancy, unspecified trimester: Secondary | ICD-10-CM

## 2018-01-04 ENCOUNTER — Other Ambulatory Visit: Payer: Medicaid Other

## 2018-01-04 ENCOUNTER — Encounter: Payer: Medicaid Other | Admitting: Advanced Practice Midwife

## 2018-01-06 ENCOUNTER — Ambulatory Visit (INDEPENDENT_AMBULATORY_CARE_PROVIDER_SITE_OTHER): Payer: Medicaid Other | Admitting: Obstetrics and Gynecology

## 2018-01-06 ENCOUNTER — Other Ambulatory Visit: Payer: Medicaid Other

## 2018-01-06 VITALS — BP 108/71 | HR 99 | Wt 252.0 lb

## 2018-01-06 DIAGNOSIS — Z23 Encounter for immunization: Secondary | ICD-10-CM | POA: Diagnosis not present

## 2018-01-06 DIAGNOSIS — Z3480 Encounter for supervision of other normal pregnancy, unspecified trimester: Secondary | ICD-10-CM

## 2018-01-06 MED ORDER — METOCLOPRAMIDE HCL 10 MG PO TABS: 10.0000 mg | ORAL_TABLET | Freq: Once | ORAL | 0 refills | Status: DC

## 2018-01-06 NOTE — Progress Notes (Signed)
   PRENATAL VISIT NOTE  Subjective:  Laurie Reed is a 32 y.o. [phone] at 6989w3d being seen today for ongoing prenatal care.  She is currently monitored for the following issues for this low-risk pregnancy and has Severe obesity (BMI >= 40) (HCC); Anxiety and depression; Vitamin D deficiency; Asthma; Seasonal allergic rhinitis; Supervision of other normal pregnancy, antepartum; H/O gestational diabetes in prior pregnancy, currently pregnant; Morbid obesity with BMI of 45.0-49.9, adult (HCC); and Obesity affecting pregnancy, antepartum on their problem list.  Patient reports no complaints.  Contractions: Not present. Vag. Bleeding: None.  Movement: Present. Denies leaking of fluid.   The following portions of the patient's history were reviewed and updated as appropriate: allergies, current medications, past family history, past medical history, past social history, past surgical history and problem list. Problem list updated.  Objective:   Vitals:   01/06/18 1006  BP: 108/71  Pulse: 99  Weight: 252 lb (114.3 kg)    Fetal Status: Fetal Heart Rate (bpm): 142 Fundal Height: 28 cm Movement: Present     General:  Alert, oriented and cooperative. Patient is in no acute distress.  Skin: Skin is warm and dry. No rash noted.   Cardiovascular: Normal heart rate noted  Respiratory: Normal respiratory effort, no problems with respiration noted  Abdomen: Soft, gravid, appropriate for gestational age.  Pain/Pressure: Present     Pelvic: Cervical exam deferred        Extremities: Normal range of motion.  Edema: Trace  Mental Status: Normal mood and affect. Normal behavior. Normal judgment and thought content.   Assessment and Plan:  Pregnancy: [id number] at 4489w3d  1. Supervision of other normal pregnancy, antepartum  - Patient vomited following Glucose drink - Rx for Reglan; take 1 hour before glucose test. Reschedule testing for 1 week. Patient allergic to Zofran. Hx of GDM - increase oral  fluid intake  - third trimester labs drawn today - Continue ASA    There are no diagnoses linked to this encounter. Preterm labor symptoms and general obstetric precautions including but not limited to vaginal bleeding, contractions, leaking of fluid and fetal movement were reviewed in detail with the patient. Please refer to After Visit Summary for other counseling recommendations.  Return in about 1 week (around 01/13/2018) for For 2 hour GTT : repeat due to vomiting. .  No future appointments.  Venia CarbonJennifer Rasch, NP

## 2018-01-06 NOTE — Addendum Note (Signed)
Addended by: Ernestina PatchesAPEL, Guiseppe Flanagan S on: 01/06/2018 11:44 AM   Modules accepted: Orders

## 2018-01-07 LAB — GLUCOSE TOLERANCE, 2 HOURS W/ 1HR: GLUCOSE, FASTING: 75 mg/dL (ref 65–91)

## 2018-01-07 LAB — CBC
HEMATOCRIT: 31 % — AB (ref 34.0–46.6)
Hemoglobin: 9.8 g/dL — ABNORMAL LOW (ref 11.1–15.9)
MCH: 25.8 pg — AB (ref 26.6–33.0)
MCHC: 31.6 g/dL (ref 31.5–35.7)
MCV: 82 fL (ref 79–97)
Platelets: 254 10*3/uL (ref 150–450)
RBC: 3.8 x10E6/uL (ref 3.77–5.28)
RDW: 15.5 % — AB (ref 12.3–15.4)
WBC: 9.4 10*3/uL (ref 3.4–10.8)

## 2018-01-07 LAB — RPR: RPR: NONREACTIVE

## 2018-01-07 LAB — HIV ANTIBODY (ROUTINE TESTING W REFLEX): HIV SCREEN 4TH GENERATION: NONREACTIVE

## 2018-01-12 ENCOUNTER — Other Ambulatory Visit: Payer: Medicaid Other

## 2018-01-20 ENCOUNTER — Other Ambulatory Visit: Payer: Self-pay | Admitting: Obstetrics and Gynecology

## 2018-01-20 ENCOUNTER — Other Ambulatory Visit: Payer: Medicaid Other

## 2018-01-20 DIAGNOSIS — Z3480 Encounter for supervision of other normal pregnancy, unspecified trimester: Secondary | ICD-10-CM

## 2018-01-20 NOTE — Addendum Note (Signed)
Addended by: Ernestina Patches on: 01/20/2018 09:05 AM   Modules accepted: Orders

## 2018-01-20 NOTE — Addendum Note (Signed)
Addended by: Venia Carbon I on: 01/20/2018 08:59 AM   Modules accepted: Orders

## 2018-01-21 LAB — HIV ANTIBODY (ROUTINE TESTING W REFLEX): HIV Screen 4th Generation wRfx: NONREACTIVE

## 2018-01-21 LAB — CBC
HEMATOCRIT: 31.1 % — AB (ref 34.0–46.6)
HEMOGLOBIN: 10 g/dL — AB (ref 11.1–15.9)
MCH: 27 pg (ref 26.6–33.0)
MCHC: 32.2 g/dL (ref 31.5–35.7)
MCV: 84 fL (ref 79–97)
Platelets: 244 10*3/uL (ref 150–450)
RBC: 3.71 x10E6/uL — AB (ref 3.77–5.28)
RDW: 14.9 % (ref 12.3–15.4)
WBC: 9.5 10*3/uL (ref 3.4–10.8)

## 2018-01-21 LAB — HEMOGLOBIN A1C
Est. average glucose Bld gHb Est-mCnc: 105 mg/dL
HEMOGLOBIN A1C: 5.3 % (ref 4.8–5.6)

## 2018-01-21 LAB — RPR: RPR Ser Ql: NONREACTIVE

## 2018-01-21 LAB — GLUCOSE TOLERANCE, 2 HOURS W/ 1HR: Glucose, Fasting: 73 mg/dL (ref 65–91)

## 2018-01-23 ENCOUNTER — Inpatient Hospital Stay (HOSPITAL_COMMUNITY)
Admission: AD | Admit: 2018-01-23 | Discharge: 2018-01-23 | Disposition: A | Discharge status: Home / Self Care | Payer: Medicaid Other | Source: Ambulatory Visit | Attending: Obstetrics & Gynecology | Admitting: Obstetrics & Gynecology

## 2018-01-23 ENCOUNTER — Encounter: Payer: Self-pay | Admitting: Obstetrics and Gynecology

## 2018-01-23 ENCOUNTER — Encounter (HOSPITAL_COMMUNITY): Payer: Self-pay | Admitting: *Deleted

## 2018-01-23 DIAGNOSIS — Z3A28 28 weeks gestation of pregnancy: Secondary | ICD-10-CM | POA: Diagnosis not present

## 2018-01-23 DIAGNOSIS — O26893 Other specified pregnancy related conditions, third trimester: Secondary | ICD-10-CM | POA: Diagnosis not present

## 2018-01-23 DIAGNOSIS — R109 Unspecified abdominal pain: Secondary | ICD-10-CM | POA: Diagnosis present

## 2018-01-23 DIAGNOSIS — M545 Low back pain: Secondary | ICD-10-CM | POA: Diagnosis present

## 2018-01-23 DIAGNOSIS — O99019 Anemia complicating pregnancy, unspecified trimester: Secondary | ICD-10-CM | POA: Insufficient documentation

## 2018-01-23 LAB — GLUCOSE, CAPILLARY: Glucose-Capillary: 60 mg/dL — ABNORMAL LOW (ref 70–99)

## 2018-01-23 LAB — URINALYSIS, ROUTINE W REFLEX MICROSCOPIC
BILIRUBIN URINE: NEGATIVE
Hgb urine dipstick: NEGATIVE
KETONES UR: 5 mg/dL — AB
LEUKOCYTES UA: NEGATIVE
Nitrite: NEGATIVE
Protein, ur: 30 mg/dL — AB
Specific Gravity, Urine: 1.027 (ref 1.005–1.030)
pH: 5 (ref 5.0–8.0)

## 2018-01-23 MED ORDER — NIFEDIPINE 10 MG PO CAPS
10.0000 mg | ORAL_CAPSULE | Freq: Once | ORAL | Status: AC
  Administered 2018-01-23: 10 mg via ORAL
  Filled 2018-01-23: qty 1

## 2018-01-23 MED ORDER — ACETAMINOPHEN 500 MG PO TABS
1000.0000 mg | ORAL_TABLET | Freq: Once | ORAL | Status: AC
  Administered 2018-01-23: 1000 mg via ORAL
  Filled 2018-01-23: qty 2

## 2018-01-23 NOTE — Discharge Instructions (Signed)
Preterm Labor and Birth Information The normal length of a pregnancy is 39-41 weeks. Preterm labor is when labor starts before 37 completed weeks of pregnancy. What are the risk factors for preterm labor? Preterm labor is more likely to occur in women who:  Have certain infections during pregnancy such as a bladder infection, sexually transmitted infection, or infection inside the uterus (chorioamnionitis).  Have a shorter-than-normal cervix.  Have gone into preterm labor before.  Have had surgery on their cervix.  Are younger than age 31 or older than age 31.  Are African American.  Are pregnant with twins or multiple babies (multiple gestation).  Take street drugs or smoke while pregnant.  Do not gain enough weight while pregnant.  Became pregnant shortly after having been pregnant.  What are the symptoms of preterm labor? Symptoms of preterm labor include:  Cramps similar to those that can happen during a menstrual period. The cramps may happen with diarrhea.  Pain in the abdomen or lower back.  Regular uterine contractions that may feel like tightening of the abdomen.  A feeling of increased pressure in the pelvis.  Increased watery or bloody mucus discharge from the vagina.  Water breaking (ruptured amniotic sac).  Why is it important to recognize signs of preterm labor? It is important to recognize signs of preterm labor because babies who are born prematurely may not be fully developed. This can put them at an increased risk for:  Long-term (chronic) heart and lung problems.  Difficulty immediately after birth with regulating body systems, including blood sugar, body temperature, heart rate, and breathing rate.  Bleeding in the brain.  Cerebral palsy.  Learning difficulties.  Death.  These risks are highest for babies who are born before 37 weeks of pregnancy. How is preterm labor treated? Treatment depends on the length of your pregnancy, your  condition, and the health of your baby. It may involve:  Having a stitch (suture) placed in your cervix to prevent your cervix from opening too early (cerclage).  Taking or being given medicines, such as: ? Hormone medicines. These may be given early in pregnancy to help support the pregnancy. ? Medicine to stop contractions. ? Medicines to help mature the babys lungs. These may be prescribed if the risk of delivery is high. ? Medicines to prevent your baby from developing cerebral palsy.  If the labor happens before 34 weeks of pregnancy, you may need to stay in the hospital. What should I do if I think I am in preterm labor? If you think that you are going into preterm labor, call your health care provider right away. How can I prevent preterm labor in future pregnancies? To increase your chance of having a full-term pregnancy:  Do not use any tobacco products, such as cigarettes, chewing tobacco, and e-cigarettes. If you need help quitting, ask your health care provider.  Do not use street drugs or medicines that have not been prescribed to you during your pregnancy.  Talk with your health care provider before taking any herbal supplements, even if you have been taking them regularly.  Make sure you gain a healthy amount of weight during your pregnancy.  Watch for infection. If you think that you might have an infection, get it checked right away.  Make sure to tell your health care provider if you have gone into preterm labor before.  This information is not intended to replace advice given to you by your health care provider. Make sure you discuss any questions  you have with your health care provider. Document Released: 07/24/2003 Document Revised: 10/14/2015 Document Reviewed: 09/24/2015 Elsevier Interactive Patient Education  2018 [employer] Back Pain in Pregnancy Back pain during pregnancy is common. Back pain may be caused by several factors that are related to changes  during your pregnancy. Follow these instructions at home: Managing pain, stiffness, and swelling  If directed, apply ice for sudden (acute) back pain. ? Put ice in a plastic bag. ? Place a towel between your skin and the bag. ? Leave the ice on for 20 minutes, 2-3 times per day.  If directed, apply heat to the affected area before you exercise: ? Place a towel between your skin and the heat pack or heating pad. ? Leave the heat on for 20-30 minutes. ? Remove the heat if your skin turns bright red. This is especially important if you are unable to feel pain, heat, or cold. You may have a greater risk of getting burned. Activity  Exercise as told by your health care provider. Exercising is the best way to prevent or manage back pain.  Listen to your body when lifting. If lifting hurts, ask for help or bend your knees. This uses your leg muscles instead of your back muscles.  Squat down when picking up something from the floor. Do not bend over.  Only use bed rest as told by your health care provider. Bed rest should only be used for the most severe episodes of back pain. Standing, Sitting, and Lying Down  Do not stand in one place for long periods of time.  Use good posture when sitting. Make sure your head rests over your shoulders and is not hanging forward. Use a pillow on your lower back if necessary.  Try sleeping on your side, preferably the left side, with a pillow or two between your legs. If you are sore after a night's rest, your bed may be too soft. A firm mattress may provide more support for your back during pregnancy. General instructions  Do not wear high heels.  Eat a healthy diet. Try to gain weight within your health care provider's recommendations.  Use a maternity girdle, elastic sling, or back brace as told by your health care provider.  Take over-the-counter and prescription medicines only as told by your health care provider.  Keep all follow-up visits as  told by your health care provider. This is important. This includes any visits with any specialists, such as a physical therapist. Contact a health care provider if:  Your back pain interferes with your daily activities.  You have increasing pain in other parts of your body. Get help right away if:  You develop numbness, tingling, weakness, or problems with the use of your arms or legs.  You develop severe back pain that is not controlled with medicine.  You have a sudden change in bowel or bladder control.  You develop shortness of breath, dizziness, or you faint.  You develop nausea, vomiting, or sweating.  You have back pain that is a rhythmic, cramping pain similar to labor pains. Labor pain is usually 1-2 minutes apart, lasts for about 1 minute, and involves a bearing down feeling or pressure in your pelvis.  You have back pain and your water breaks or you have vaginal bleeding.  You have back pain or numbness that travels down your leg.  Your back pain developed after you fell.  You develop pain on one side of your back.  You see blood in  your urine.  You develop skin blisters in the area of your back pain. This information is not intended to replace advice given to you by your health care provider. Make sure you discuss any questions you have with your health care provider. Document Released: 08/11/2005 Document Revised: 10/09/2015 Document Reviewed: 01/15/2015 Elsevier Interactive Patient Education  [employer].

## 2018-01-23 NOTE — MAU Provider Note (Signed)
Chief Complaint:  Abdominal Pain; Back Pain; and Vaginal Pain   First Provider Initiated Contact with Patient 01/23/18 1237     HPI: Laurie Reed is a 32 y.o. [id number] at [redacted]w[redacted]d who presents to maternity admissions reporting abdominal cramping and low back pain. Symptoms started 4 days ago. Sometimes constant & sometimes intermittent. Pain worse while at work. Denies leakage of fluid or vaginal bleeding. Good fetal movement.  Location: abdomen & low back Quality: cramping Severity: 8/10 in pain scale Duration: 4 days Timing: sporadic Modifying factors: worse at work. Had taken tylenol without relief.  Associated signs and symptoms: none.    Past Medical History:  Diagnosis Date  . Acid reflux 2004  . Anemia   . Anxiety 2008  . Asthma 1993   no previous intubations or hospitalizations   . Chlamydia   . Depression 2008   no meds, currenly ok  . Environmental allergies   . Migraines   . Miscarriage   . Ovarian cyst   . Pre-diabetes   . Ulcer of the stomach and intestine 2004  . Urinary tract infection    OB History  Gravida Para Term Preterm AB Living  7 3 3  0 3 3  SAB TAB Ectopic Multiple Live Births  3 0 0 0 3    # Outcome Date GA Lbr Len/2nd Weight Sex Delivery Anes PTL Lv  7 Current           6 SAB 06/26/17 [redacted]w[redacted]d         5 Term 04/10/13 [redacted]w[redacted]d 02:46 / 01:38 3470 g F Vag-Spont EPI  LIV     Birth Comments: WNL  4 SAB 2013          3 Term 2011 [redacted]w[redacted]d 10:00 3572 g F Vag-Spont EPI  LIV  2 Term 2010 [redacted]w[redacted]d  2863 g M Vag-Spont None  LIV     Birth Comments: gestational diabetes  1 SAB 2008           Past Surgical History:  Procedure Laterality Date  . COLONOSCOPY  2004   . IRRIGATION AND DEBRIDEMENT SEBACEOUS CYST N/A 06/01/2017   Procedure: EXCISION SEBACEOUS CYST ON SCALP;  Surgeon: Ancil Linsey, MD;  Location: [facility] ORS;  Service: General;  Laterality: N/A;  . MULTIPLE TOOTH EXTRACTIONS  2015   6 teeth removed   . WISDOM TOOTH EXTRACTION  2007   Family  History  Problem Relation Age of Onset  . Asthma Mother   . Heart disease Mother   . Hypertension Mother   . Asthma Maternal Aunt   . Hypertension Maternal Aunt   . Diabetes Maternal Aunt   . Asthma Maternal Uncle   . Hypertension Maternal Uncle   . Diabetes Maternal Uncle   . Heart disease Maternal Grandmother   . Diabetes Maternal Grandmother   . Hypertension Maternal Grandmother   . Heart disease Maternal Grandfather   . Diabetes Maternal Grandfather   . Hypertension Maternal Grandfather   . Hypertension Paternal Grandmother   . Hypotension Neg Hx   . Anesthesia problems Neg Hx   . Malignant hyperthermia Neg Hx   . Pseudochol deficiency Neg Hx   . Cancer Neg Hx    Social History   Tobacco Use  . Smoking status: Never Smoker  . Smokeless tobacco: Never Used  Substance Use Topics  . Alcohol use: No    Alcohol/week: 0.0 standard drinks  . Drug use: No   Allergies  Allergen Reactions  . Other Anaphylaxis    "  20 different types of trees"  . Latex Hives and Itching  . Zofran Itching   Medications Prior to Admission  Medication Sig Dispense Refill Last Dose  . acetaminophen (TYLENOL) 500 MG tablet Take 500 mg by mouth every 6 (six) hours as needed.   unk at prn  . aspirin 81 MG chewable tablet Chew 1 tablet (81 mg total) by mouth daily. 30 tablet 4 01/22/2018 at Unknown time  . butalbital-acetaminophen-caffeine (FIORICET, ESGIC) 50-325-40 MG tablet Take 1 tablet by mouth every 8 (eight) hours as needed for headache. 20 tablet 0 unk at prn  . cetirizine (ZYRTEC) 10 MG tablet Take 1 tablet (10 mg total) by mouth daily. 30 tablet 0 01/22/2018 at Unknown time  . cyclobenzaprine (FLEXERIL) 10 MG tablet Take 1 tablet (10 mg total) by mouth every 8 (eight) hours as needed for muscle spasms. 30 tablet 0 unk at prn  . famotidine (PEPCID) 20 MG tablet Take 1 tablet (20 mg total) by mouth 2 (two) times daily. 60 tablet 3 01/22/2018 at Unknown time  . loratadine (CLARITIN) 10 MG tablet  Take 10 mg by mouth daily.   01/22/2018 at Unknown time  . Prenatal Multivit-Min-Fe-FA (PRENATAL VITAMINS) 0.8 MG tablet Take 1 tablet by mouth daily. 30 tablet 12 01/22/2018 at Unknown time  . Elastic Bandages & Supports (COMFORT FIT MATERNITY SUPP LG) MISC 1 Units by Does not apply route daily. 1 each 0 Taking  . metoCLOPramide (REGLAN) 10 MG tablet Take 1 tablet (10 mg total) by mouth once for 1 dose. Take 1 hour prior to glucose testing (Patient not taking: Reported on 01/23/2018) 1 tablet 0 Not Taking at Unknown time  . nystatin (MYCOSTATIN/NYSTOP) powder Apply topically 4 (four) times daily. (Patient not taking: Reported on 12/07/2017) 45 g 0 Completed Course at Unknown time  . terconazole (TERAZOL 3) 0.8 % vaginal cream Place 1 applicator vaginally at bedtime. (Patient not taking: Reported on 01/23/2018) 20 g 0 Completed Course at Unknown time    I have reviewed patient's Past Medical Hx, Surgical Hx, Family Hx, Social Hx, medications and allergies.   ROS:  Review of Systems  Constitutional: Negative.   Gastrointestinal: Positive for abdominal pain. Negative for constipation, diarrhea, nausea and vomiting.  Genitourinary: Negative for dysuria, vaginal bleeding and vaginal discharge.  Musculoskeletal: Positive for back pain.    Physical Exam   Patient Vitals for the past 24 hrs:  BP Temp Temp src Pulse Resp Height Weight  01/23/18 1305 118/64 - - - - - -  01/23/18 1200 113/62 98.4 F (36.9 C) Oral (!) 114 18 5\' 2"  (1.575 m) 113.9 kg    Constitutional: Well-developed, well-nourished female in no acute distress.  Cardiovascular: normal rate & rhythm, no murmur Respiratory: normal effort, lung sounds clear throughout GI: Abd soft, non-tender, gravid appropriate for gestational age. Pos BS x 4 MS: Extremities nontender, no edema, normal ROM Neurologic: Alert and oriented x 4.  GU:     Dilation: Closed Effacement (%): Thick Cervical Position: Posterior Exam by:: Lyman Bishop, NP  NST:   Baseline: 140 bpm, Variability: Good {> 6 bpm), Accelerations: Reactive, Decelerations: Absent and no contractions   Labs: Results for orders placed or performed during the hospital encounter of 01/23/18 (from the past 24 hour(s))  Urinalysis, Routine w reflex microscopic     Status: Abnormal   Collection Time: 01/23/18 12:41 PM  Result Value Ref Range   Color, Urine YELLOW YELLOW   APPearance CLEAR CLEAR   Specific Gravity, Urine 1.027  1.005 - 1.030   pH 5.0 5.0 - 8.0   Glucose, UA >=500 (A) NEGATIVE mg/dL   Hgb urine dipstick NEGATIVE NEGATIVE   Bilirubin Urine NEGATIVE NEGATIVE   Ketones, ur 5 (A) NEGATIVE mg/dL   Protein, ur 30 (A) NEGATIVE mg/dL   Nitrite NEGATIVE NEGATIVE   Leukocytes, UA NEGATIVE NEGATIVE   RBC / HPF 0-5 0 - 5 RBC/hpf   WBC, UA 0-5 0 - 5 WBC/hpf   Bacteria, UA RARE (A) NONE SEEN   Squamous Epithelial / LPF 6-10 0 - 5   Mucus PRESENT   Glucose, capillary     Status: Abnormal   Collection Time: 01/23/18  1:30 PM  Result Value Ref Range   Glucose-Capillary 60 (L) 70 - 99 mg/dL    Imaging:  No results found.  MAU Course: Orders Placed This Encounter  Procedures  . Urinalysis, Routine w reflex microscopic  . Glucose, capillary  . Discharge patient   Meds ordered this encounter  Medications  . NIFEdipine (PROCARDIA) capsule 10 mg  . acetaminophen (TYLENOL) tablet 1,000 mg    MDM: No ctx & cervix closed/thick Procardia 10 mg & tylenol 1 gm given --- improvement in pain  Assessment: 1. Abdominal pain during pregnancy in third trimester   2. [redacted] weeks gestation of pregnancy     Plan: Discharge home in stable condition.  Preterm  Labor precautions and fetal kick counts   Allergies as of 01/23/2018      Reactions   Other Anaphylaxis   "20 different types of trees"   Latex Hives, Itching   Zofran Itching      Medication List    STOP taking these medications   metoCLOPramide 10 MG tablet Commonly known as:  REGLAN   nystatin  powder Commonly known as:  MYCOSTATIN/NYSTOP   terconazole 0.8 % vaginal cream Commonly known as:  TERAZOL 3     TAKE these medications   acetaminophen 500 MG tablet Commonly known as:  TYLENOL Take 500 mg by mouth every 6 (six) hours as needed.   aspirin 81 MG chewable tablet Chew 1 tablet (81 mg total) by mouth daily.   butalbital-acetaminophen-caffeine 50-325-40 MG tablet Commonly known as:  FIORICET, ESGIC Take 1 tablet by mouth every 8 (eight) hours as needed for headache.   cetirizine 10 MG tablet Commonly known as:  ZYRTEC Take 1 tablet (10 mg total) by mouth daily.   COMFORT FIT MATERNITY SUPP LG Misc 1 Units by Does not apply route daily.   cyclobenzaprine 10 MG tablet Commonly known as:  FLEXERIL Take 1 tablet (10 mg total) by mouth every 8 (eight) hours as needed for muscle spasms.   famotidine 20 MG tablet Commonly known as:  PEPCID Take 1 tablet (20 mg total) by mouth 2 (two) times daily.   loratadine 10 MG tablet Commonly known as:  CLARITIN Take 10 mg by mouth daily.   Prenatal Vitamins 0.8 MG tablet Take 1 tablet by mouth daily.       Judeth Horn, NP 01/23/2018 2:08 PM

## 2018-01-23 NOTE — MAU Note (Signed)
Pt C/O lower abd cramping, also in back & vagina for the last 4 days.  Denies bleeding or LOF.  Reports good fetal movement.

## 2018-02-03 ENCOUNTER — Ambulatory Visit (INDEPENDENT_AMBULATORY_CARE_PROVIDER_SITE_OTHER): Payer: Medicaid Other | Admitting: Internal Medicine

## 2018-02-03 VITALS — BP 130/71 | HR 109 | Wt 247.0 lb

## 2018-02-03 DIAGNOSIS — O99210 Obesity complicating pregnancy, unspecified trimester: Secondary | ICD-10-CM

## 2018-02-03 DIAGNOSIS — B373 Candidiasis of vulva and vagina: Secondary | ICD-10-CM

## 2018-02-03 DIAGNOSIS — Z3480 Encounter for supervision of other normal pregnancy, unspecified trimester: Secondary | ICD-10-CM

## 2018-02-03 DIAGNOSIS — O26843 Uterine size-date discrepancy, third trimester: Secondary | ICD-10-CM

## 2018-02-03 DIAGNOSIS — O99013 Anemia complicating pregnancy, third trimester: Secondary | ICD-10-CM

## 2018-02-03 DIAGNOSIS — B3731 Acute candidiasis of vulva and vagina: Secondary | ICD-10-CM

## 2018-02-03 MED ORDER — FERROUS SULFATE 325 (65 FE) MG PO TABS: 325.0000 mg | ORAL_TABLET | Freq: Two times a day (BID) | ORAL | 3 refills | Status: DC

## 2018-02-03 MED ORDER — TERCONAZOLE 0.8 % VA CREA
1.0000 | TOPICAL_CREAM | Freq: Every day | VAGINAL | 0 refills | Status: AC
Start: 2018-02-03 — End: 2018-02-06

## 2018-02-03 NOTE — Progress Notes (Signed)
   PRENATAL VISIT NOTE  Subjective:  Laurie Reed is a 32 y.o. [phone] at 3725w3d being seen today for ongoing prenatal care.  She is currently monitored for the following issues for this low-risk pregnancy and has Severe obesity (BMI >= 40) (HCC); Anxiety and depression; Vitamin D deficiency; Asthma; Seasonal allergic rhinitis; Supervision of other normal pregnancy, antepartum; H/O gestational diabetes in prior pregnancy, currently pregnant; Morbid obesity with BMI of 45.0-49.9, adult (HCC); Obesity affecting pregnancy, antepartum; and Anemia in pregnancy on their problem list.  Patient reports that she has another yeast infection, requests medication.  Contractions: Not present. Vag. Bleeding: None.  Movement: Present. Denies leaking of fluid.   The following portions of the patient's history were reviewed and updated as appropriate: allergies, current medications, past family history, past medical history, past social history, past surgical history and problem list. Problem list updated.  Objective:   Vitals:   02/03/18 1323  BP: 130/71  Pulse: (!) 109  Weight: 247 lb (112 kg)    Fetal Status: Fetal Heart Rate (bpm): 138 Fundal Height: 36 cm Movement: Present     General:  Alert, oriented and cooperative. Patient is in no acute distress.  Skin: Skin is warm and dry. No rash noted.   Cardiovascular: Normal heart rate noted  Respiratory: Normal respiratory effort, no problems with respiration noted  Abdomen: Soft, gravid, appropriate for gestational age.  Pain/Pressure: Present     Pelvic: Cervical exam deferred        Extremities: Normal range of motion.  Edema: Trace  Mental Status: Normal mood and affect. Normal behavior. Normal judgment and thought content.   Assessment and Plan:  Pregnancy: [id number] at 7725w3d  1. Supervision of other normal pregnancy, antepartum Hx of GDM, vomited during glucola, repeat test unable to be performed >>A1c WNL at 5.3%.  Declined flu vaccine.    2. Anemia during pregnancy in third trimester HgB 10.0 on 28 wk CBC.  - ferrous sulfate 325 (65 FE) MG tablet; Take 1 tablet (325 mg total) by mouth 2 (two) times daily with a meal.  Dispense: 60 tablet; Refill: 3  3. Obesity affecting pregnancy, antepartum Continue ASA.   4. Yeast vaginitis - terconazole (TERAZOL 3) 0.8 % vaginal cream; Place 1 applicator vaginally at bedtime for 3 days.  Dispense: 20 g; Refill: 0  5. Uterine size date discrepancy pregnancy, third trimester Patient's fundal height is measuring at 36 cm at [redacted] weeks GA. May be simply related to obesity in pregnancy however patient was previously measuring more consistent with dates and is actually down in weight since last visit. Will obtain sono.  - US MFM OB FOLLOW UP; Future  Preterm labor symptoms and general obstetric precautions including but not limited to vaginal bleeding, contractions, leaking of fluid and fetal movement were reviewed in detail with the patient. Please refer to After Visit Summary for other counseling recommendations.  Return in about 2 weeks (around 02/17/2018) for routine PNC.  No future appointments.  De Hollingsheadatherine L Wallace, DO

## 2018-02-03 NOTE — Patient Instructions (Signed)

## 2018-02-06 ENCOUNTER — Inpatient Hospital Stay (HOSPITAL_COMMUNITY)
Admission: AD | Admit: 2018-02-06 | Discharge: 2018-02-06 | Disposition: A | Discharge status: Home / Self Care | Payer: Medicaid Other | Source: Ambulatory Visit | Attending: Obstetrics and Gynecology | Admitting: Obstetrics and Gynecology

## 2018-02-06 ENCOUNTER — Encounter (HOSPITAL_COMMUNITY): Payer: Self-pay

## 2018-02-06 DIAGNOSIS — O09299 Supervision of pregnancy with other poor reproductive or obstetric history, unspecified trimester: Secondary | ICD-10-CM

## 2018-02-06 DIAGNOSIS — M549 Dorsalgia, unspecified: Secondary | ICD-10-CM | POA: Diagnosis present

## 2018-02-06 DIAGNOSIS — Z3689 Encounter for other specified antenatal screening: Secondary | ICD-10-CM

## 2018-02-06 DIAGNOSIS — R109 Unspecified abdominal pain: Secondary | ICD-10-CM | POA: Diagnosis not present

## 2018-02-06 DIAGNOSIS — O26893 Other specified pregnancy related conditions, third trimester: Secondary | ICD-10-CM | POA: Diagnosis not present

## 2018-02-06 DIAGNOSIS — O99013 Anemia complicating pregnancy, third trimester: Secondary | ICD-10-CM

## 2018-02-06 DIAGNOSIS — R319 Hematuria, unspecified: Secondary | ICD-10-CM | POA: Insufficient documentation

## 2018-02-06 DIAGNOSIS — Z3480 Encounter for supervision of other normal pregnancy, unspecified trimester: Secondary | ICD-10-CM

## 2018-02-06 DIAGNOSIS — R3121 Asymptomatic microscopic hematuria: Secondary | ICD-10-CM

## 2018-02-06 DIAGNOSIS — Z3A30 30 weeks gestation of pregnancy: Secondary | ICD-10-CM | POA: Diagnosis not present

## 2018-02-06 DIAGNOSIS — O99210 Obesity complicating pregnancy, unspecified trimester: Secondary | ICD-10-CM

## 2018-02-06 DIAGNOSIS — Z8632 Personal history of gestational diabetes: Secondary | ICD-10-CM

## 2018-02-06 LAB — URINALYSIS, ROUTINE W REFLEX MICROSCOPIC
Bilirubin Urine: NEGATIVE
Glucose, UA: 150 mg/dL — AB
Ketones, ur: NEGATIVE mg/dL
Nitrite: NEGATIVE
PH: 6 (ref 5.0–8.0)
Protein, ur: 30 mg/dL — AB
RBC / HPF: >50 RBC/hpf — ABNORMAL HIGH (ref 0–5)
SPECIFIC GRAVITY, URINE: 1.033 — AB (ref 1.005–1.030)

## 2018-02-06 LAB — WET PREP, GENITAL
CLUE CELLS WET PREP: NONE SEEN
SPERM: NONE SEEN
TRICH WET PREP: NONE SEEN
WBC WET PREP: NONE SEEN
Yeast Wet Prep HPF POC: NONE SEEN

## 2018-02-06 MED ORDER — ACETAMINOPHEN 325 MG PO TABS
650.0000 mg | ORAL_TABLET | Freq: Once | ORAL | Status: AC
  Administered 2018-02-06: 650 mg via ORAL
  Filled 2018-02-06: qty 2

## 2018-02-06 NOTE — Discharge Instructions (Signed)

## 2018-02-06 NOTE — MAU Provider Note (Signed)
History     CSN: [id number]  Arrival date and time: 02/06/18 1555   First Provider Initiated Contact with Patient 02/06/18 1618      Chief Complaint  Patient presents with  . Abdominal Pain  . Back Pain   HPI  Laurie Reed is a 32 y.o. [id number] at 7964w6d who presents to MAU with chief complaint of back pain and vaginal pressure. Endorses history of back injury related to lifting boxes at work. Denies fever, recent illness, abdominal pain. Denies vaginal bleeding, leaking of fluid, decreased fetal movement, fever, falls, or recent illness.    Back pain This is a recurring problem. Onset third trimester. Patient reports mild crampy pain at her mid-back, unable to give rating, does not radiate. Alleviated by previously prescribed Flexeril. Pt not taking because she drives herself around town and the Flexeril makes her too sleepy to drive.   Vaginal pressure This is a recurring problem. Patient has maternity belt but does not wear it due to extreme itching.   OB History    Gravida  7   Para  3   Term  3   Preterm  0   AB  3   Living  3     SAB  3   TAB  0   Ectopic  0   Multiple  0   Live Births  3           Past Medical History:  Diagnosis Date  . Acid reflux 2004  . Anemia   . Anxiety 2008  . Asthma 1993   no previous intubations or hospitalizations   . Chlamydia   . Depression 2008   no meds, currenly ok  . Environmental allergies   . Migraines   . Miscarriage   . Ovarian cyst   . Pre-diabetes   . Ulcer of the stomach and intestine 2004  . Urinary tract infection     Past Surgical History:  Procedure Laterality Date  . COLONOSCOPY  2004   . IRRIGATION AND DEBRIDEMENT SEBACEOUS CYST N/A 06/01/2017   Procedure: EXCISION SEBACEOUS CYST ON SCALP;  Surgeon: Ancil Linseyavis, Jason Evan, MD;  Location: [facility] ORS;  Service: General;  Laterality: N/A;  . MULTIPLE TOOTH EXTRACTIONS  2015   6 teeth removed   . WISDOM TOOTH EXTRACTION  2007    Family  History  Problem Relation Age of Onset  . Asthma Mother   . Heart disease Mother   . Hypertension Mother   . Asthma Maternal Aunt   . Hypertension Maternal Aunt   . Diabetes Maternal Aunt   . Asthma Maternal Uncle   . Hypertension Maternal Uncle   . Diabetes Maternal Uncle   . Heart disease Maternal Grandmother   . Diabetes Maternal Grandmother   . Hypertension Maternal Grandmother   . Heart disease Maternal Grandfather   . Diabetes Maternal Grandfather   . Hypertension Maternal Grandfather   . Hypertension Paternal Grandmother   . Hypotension Neg Hx   . Anesthesia problems Neg Hx   . Malignant hyperthermia Neg Hx   . Pseudochol deficiency Neg Hx   . Cancer Neg Hx     Social History   Tobacco Use  . Smoking status: Never Smoker  . Smokeless tobacco: Never Used  Substance Use Topics  . Alcohol use: No    Alcohol/week: 0.0 standard drinks  . Drug use: No    Allergies:  Allergies  Allergen Reactions  . Other Anaphylaxis    "20 different types of  trees"  . Latex Hives and Itching  . Zofran Itching    No medications prior to admission.    Review of Systems  Constitutional: Negative for chills and fever.  Respiratory: Negative for shortness of breath.   Gastrointestinal: Negative for abdominal pain, nausea and vomiting.  Genitourinary: Negative for difficulty urinating, dyspareunia, dysuria, flank pain, vaginal bleeding, vaginal discharge and vaginal pain.  Musculoskeletal: Positive for back pain.  Neurological: Negative for headaches.  All other systems reviewed and are negative.  Physical Exam   Blood pressure 108/82, pulse 96, temperature 98.3 F (36.8 C), temperature source Oral, last menstrual period 05/01/2017, unknown if currently breastfeeding.  Physical Exam  Nursing note and vitals reviewed. Constitutional: She is oriented to person, place, and time. She appears well-developed and well-nourished.  Cardiovascular: Normal rate, normal heart sounds  and intact distal pulses.  Respiratory: Effort normal and breath sounds normal.  GI: Bowel sounds are normal. She exhibits no distension. There is no tenderness. There is no rebound, no guarding and no CVA tenderness.  Gravid  Genitourinary: No vaginal discharge found.  Neurological: She is alert and oriented to person, place, and time. She has normal reflexes.  Skin: Skin is warm and dry.  Psychiatric: She has a normal mood and affect. Her behavior is normal. Judgment and thought content normal.    MAU Course  Procedures  MDM --Reactive fetal tracing: baseline 140, moderate variability, positive accelerations, no decelerations --Toco: quiet --Hematuria --Afebrile, no history of recent fever. No CVA tenderness, no leukocytosis, no abdominal tenderness  Patient Vitals for the past 24 hrs:  BP Temp Temp src Pulse  02/06/18 1741 108/82 - - 96  02/06/18 1615 124/70 - - (!) 106  02/06/18 1613 - 98.3 F (36.8 C) Oral -    Results for orders placed or performed during the hospital encounter of 02/06/18 (from the past 24 hour(s))  Urinalysis, Routine w reflex microscopic     Status: Abnormal   Collection Time: 02/06/18  4:46 PM  Result Value Ref Range   Color, Urine YELLOW YELLOW   APPearance CLOUDY (A) CLEAR   Specific Gravity, Urine 1.033 (H) 1.005 - 1.030   pH 6.0 5.0 - 8.0   Glucose, UA 150 (A) NEGATIVE mg/dL   Hgb urine dipstick LARGE (A) NEGATIVE   Bilirubin Urine NEGATIVE NEGATIVE   Ketones, ur NEGATIVE NEGATIVE mg/dL   Protein, ur 30 (A) NEGATIVE mg/dL   Nitrite NEGATIVE NEGATIVE   Leukocytes, UA SMALL (A) NEGATIVE   RBC / HPF >50 (H) 0 - 5 RBC/hpf   WBC, UA 0-5 0 - 5 WBC/hpf   Bacteria, UA RARE (A) NONE SEEN   Squamous Epithelial / LPF 21-50 0 - 5   Mucus PRESENT   Wet prep, genital     Status: None   Collection Time: 02/06/18  4:46 PM  Result Value Ref Range   Yeast Wet Prep HPF POC NONE SEEN NONE SEEN   Trich, Wet Prep NONE SEEN NONE SEEN   Clue Cells Wet Prep  HPF POC NONE SEEN NONE SEEN   WBC, Wet Prep HPF POC NONE SEEN NONE SEEN   Sperm NONE SEEN    Assessment and Plan  --32 y.o. [id number] at [redacted]w[redacted]d  --Reactive fetal tracing --Hematuria, otherwise asymptomatic. Urine culture ordered. Will follow up PRN --Reviewed precautions r/t hematuria. Call clinic for new onset flank pain, foul smelling urine, abdominal pain, fever --Patient to manage round ligament/pubic symphysis discomfort with existing maternity belt on top of clothes --Benadryl  PRN for itching. Pt to call clinic for new onset itching on palms and/or soles of feet, not assoc with belt --Discharge home in stable condition  F/U: LOB 02/20/18 Calvert Cantor, CNM 02/06/2018, 5:43 PM

## 2018-02-06 NOTE — MAU Note (Signed)
Pt is a G7P3 at 30.6 weeks c/o pressure in vagina and back pain.  No VB or LOF.  Pt reports good FM.  No other OB or medical concerns.

## 2018-02-07 ENCOUNTER — Inpatient Hospital Stay (HOSPITAL_COMMUNITY)
Admission: AD | Admit: 2018-02-07 | Discharge: 2018-02-07 | Disposition: A | Discharge status: Home / Self Care | Payer: Medicaid Other | Source: Ambulatory Visit | Attending: Obstetrics and Gynecology | Admitting: Obstetrics and Gynecology

## 2018-02-07 ENCOUNTER — Inpatient Hospital Stay (HOSPITAL_COMMUNITY): Payer: Medicaid Other

## 2018-02-07 ENCOUNTER — Encounter (HOSPITAL_COMMUNITY): Payer: Self-pay

## 2018-02-07 ENCOUNTER — Other Ambulatory Visit: Payer: Self-pay

## 2018-02-07 DIAGNOSIS — M549 Dorsalgia, unspecified: Secondary | ICD-10-CM | POA: Diagnosis not present

## 2018-02-07 DIAGNOSIS — O9989 Other specified diseases and conditions complicating pregnancy, childbirth and the puerperium: Secondary | ICD-10-CM | POA: Diagnosis not present

## 2018-02-07 DIAGNOSIS — O99013 Anemia complicating pregnancy, third trimester: Secondary | ICD-10-CM

## 2018-02-07 DIAGNOSIS — Z3A31 31 weeks gestation of pregnancy: Secondary | ICD-10-CM | POA: Insufficient documentation

## 2018-02-07 DIAGNOSIS — Z8632 Personal history of gestational diabetes: Secondary | ICD-10-CM

## 2018-02-07 DIAGNOSIS — R319 Hematuria, unspecified: Secondary | ICD-10-CM

## 2018-02-07 DIAGNOSIS — Z7982 Long term (current) use of aspirin: Secondary | ICD-10-CM | POA: Diagnosis not present

## 2018-02-07 DIAGNOSIS — O99213 Obesity complicating pregnancy, third trimester: Secondary | ICD-10-CM | POA: Diagnosis not present

## 2018-02-07 DIAGNOSIS — O09299 Supervision of pregnancy with other poor reproductive or obstetric history, unspecified trimester: Secondary | ICD-10-CM

## 2018-02-07 DIAGNOSIS — Z3480 Encounter for supervision of other normal pregnancy, unspecified trimester: Secondary | ICD-10-CM

## 2018-02-07 DIAGNOSIS — Z3689 Encounter for other specified antenatal screening: Secondary | ICD-10-CM

## 2018-02-07 DIAGNOSIS — M545 Low back pain: Secondary | ICD-10-CM | POA: Insufficient documentation

## 2018-02-07 DIAGNOSIS — O26893 Other specified pregnancy related conditions, third trimester: Secondary | ICD-10-CM | POA: Insufficient documentation

## 2018-02-07 DIAGNOSIS — O99210 Obesity complicating pregnancy, unspecified trimester: Secondary | ICD-10-CM

## 2018-02-07 DIAGNOSIS — O99891 Other specified diseases and conditions complicating pregnancy: Secondary | ICD-10-CM

## 2018-02-07 LAB — URINALYSIS, ROUTINE W REFLEX MICROSCOPIC
Bilirubin Urine: NEGATIVE
HGB URINE DIPSTICK: NEGATIVE
KETONES UR: 5 mg/dL — AB
Nitrite: NEGATIVE
PROTEIN: NEGATIVE mg/dL
Specific Gravity, Urine: 1.024 (ref 1.005–1.030)
pH: 6 (ref 5.0–8.0)

## 2018-02-07 LAB — GLUCOSE, CAPILLARY: Glucose-Capillary: 70 mg/dL (ref 70–99)

## 2018-02-07 MED ORDER — CYCLOBENZAPRINE HCL 10 MG PO TABS
10.0000 mg | ORAL_TABLET | Freq: Three times a day (TID) | ORAL | Status: DC | PRN
  Administered 2018-02-07: 10 mg via ORAL
  Filled 2018-02-07: qty 1

## 2018-02-07 MED ORDER — CYCLOBENZAPRINE HCL 10 MG PO TABS: 10.0000 mg | ORAL_TABLET | Freq: Three times a day (TID) | ORAL | 0 refills | Status: DC | PRN

## 2018-02-07 NOTE — MAU Provider Note (Addendum)
History     CSN: [id number]  Arrival date and time: 02/07/18 1136   First Provider Initiated Contact with Patient 02/07/18 1216      Chief Complaint  Patient presents with  . Abdominal Pain  . Back Pain   [id number] @31 .0 wks here with LBP. Reports pain started about 4 days ago. Describes as intermittent and sharp shooting. Rates 9/10. She has not taken anything for it. She admits to lifting patients at work, has assistance sometimes. Denies urinary sx. No fevers. Had emesis x1 this am. Also endorses intermittent lower abdominal pressure. Denies ctx, VB, and LOF. Good FM. She was seen yesterday in MAU for similar sx and was found to have hematuria, UC was sent.  OB History    Gravida  7   Para  3   Term  3   Preterm  0   AB  3   Living  3     SAB  3   TAB  0   Ectopic  0   Multiple  0   Live Births  3           Past Medical History:  Diagnosis Date  . Acid reflux 2004  . Anemia   . Anxiety 2008  . Asthma 1993   no previous intubations or hospitalizations   . Chlamydia   . Depression 2008   no meds, currenly ok  . Environmental allergies   . Migraines   . Miscarriage   . Ovarian cyst   . Pre-diabetes   . Ulcer of the stomach and intestine 2004  . Urinary tract infection     Past Surgical History:  Procedure Laterality Date  . COLONOSCOPY  2004   . IRRIGATION AND DEBRIDEMENT SEBACEOUS CYST N/A 06/01/2017   Procedure: EXCISION SEBACEOUS CYST ON SCALP;  Surgeon: Ancil Linsey, MD;  Location: [facility] ORS;  Service: General;  Laterality: N/A;  . MULTIPLE TOOTH EXTRACTIONS  2015   6 teeth removed   . WISDOM TOOTH EXTRACTION  2007    Family History  Problem Relation Age of Onset  . Asthma Mother   . Heart disease Mother   . Hypertension Mother   . Asthma Maternal Aunt   . Hypertension Maternal Aunt   . Diabetes Maternal Aunt   . Asthma Maternal Uncle   . Hypertension Maternal Uncle   . Diabetes Maternal Uncle   . Heart disease Maternal  Grandmother   . Diabetes Maternal Grandmother   . Hypertension Maternal Grandmother   . Heart disease Maternal Grandfather   . Diabetes Maternal Grandfather   . Hypertension Maternal Grandfather   . Hypertension Paternal Grandmother   . Hypotension Neg Hx   . Anesthesia problems Neg Hx   . Malignant hyperthermia Neg Hx   . Pseudochol deficiency Neg Hx   . Cancer Neg Hx     Social History   Tobacco Use  . Smoking status: Never Smoker  . Smokeless tobacco: Never Used  Substance Use Topics  . Alcohol use: No    Alcohol/week: 0.0 standard drinks  . Drug use: No    Allergies:  Allergies  Allergen Reactions  . Other Anaphylaxis    "20 different types of trees"  . Latex Hives and Itching  . Zofran Itching    Medications Prior to Admission  Medication Sig Dispense Refill Last Dose  . acetaminophen (TYLENOL) 500 MG tablet Take 500 mg by mouth every 6 (six) hours as needed.   unk at prn  .  aspirin 81 MG chewable tablet Chew 1 tablet (81 mg total) by mouth daily. 30 tablet 4 01/22/2018 at Unknown time  . butalbital-acetaminophen-caffeine (FIORICET, ESGIC) 50-325-40 MG tablet Take 1 tablet by mouth every 8 (eight) hours as needed for headache. 20 tablet 0 unk at prn  . cetirizine (ZYRTEC) 10 MG tablet Take 1 tablet (10 mg total) by mouth daily. 30 tablet 0 01/22/2018 at Unknown time  . cyclobenzaprine (FLEXERIL) 10 MG tablet Take 1 tablet (10 mg total) by mouth every 8 (eight) hours as needed for muscle spasms. 30 tablet 0 unk at prn  . Elastic Bandages & Supports (COMFORT FIT MATERNITY SUPP LG) MISC 1 Units by Does not apply route daily. 1 each 0 Taking  . famotidine (PEPCID) 20 MG tablet Take 1 tablet (20 mg total) by mouth 2 (two) times daily. 60 tablet 3 01/22/2018 at Unknown time  . ferrous sulfate 325 (65 FE) MG tablet Take 1 tablet (325 mg total) by mouth 2 (two) times daily with a meal. 60 tablet 3   . loratadine (CLARITIN) 10 MG tablet Take 10 mg by mouth daily.   01/22/2018 at  Unknown time  . Prenatal Multivit-Min-Fe-FA (PRENATAL VITAMINS) 0.8 MG tablet Take 1 tablet by mouth daily. 30 tablet 12 01/22/2018 at Unknown time    Review of Systems  Constitutional: Negative for chills and fever.  Gastrointestinal: Negative for abdominal pain.  Genitourinary: Negative for dysuria, hematuria, urgency, vaginal bleeding and vaginal discharge.  Musculoskeletal: Positive for back pain.   Physical Exam   Blood pressure 130/70, pulse (!) 106, temperature 98.2 F (36.8 C), temperature source Oral, resp. rate 20, weight 113.5 kg, last menstrual period 05/01/2017, SpO2 100 %, unknown if currently breastfeeding.  Physical Exam  Nursing note and vitals reviewed. Constitutional: She is oriented to person, place, and time. She appears well-developed and well-nourished. No distress.  HENT:  Head: Normocephalic and atraumatic.  Neck: Normal range of motion.  Respiratory: Effort normal. No respiratory distress.  GI: She exhibits no distension. There is no tenderness. There is no CVA tenderness.  gravid  Musculoskeletal: Normal range of motion.       Cervical back: Normal.       Thoracic back: Normal.       Lumbar back: Normal.  Neurological: She is alert and oriented to person, place, and time.  Skin: Skin is warm and dry.  Psychiatric: She has a normal mood and affect.  EFM: 140 bpm, mod variability, + accels, no decels Toco: none  Results for orders placed or performed during the hospital encounter of 02/07/18 (from the past 24 hour(s))  Urinalysis, Routine w reflex microscopic     Status: Abnormal   Collection Time: 02/07/18 12:04 PM  Result Value Ref Range   Color, Urine YELLOW YELLOW   APPearance HAZY (A) CLEAR   Specific Gravity, Urine 1.024 1.005 - 1.030   pH 6.0 5.0 - 8.0   Glucose, UA >=500 (A) NEGATIVE mg/dL   Hgb urine dipstick NEGATIVE NEGATIVE   Bilirubin Urine NEGATIVE NEGATIVE   Ketones, ur 5 (A) NEGATIVE mg/dL   Protein, ur NEGATIVE NEGATIVE mg/dL    Nitrite NEGATIVE NEGATIVE   Leukocytes, UA TRACE (A) NEGATIVE   RBC / HPF 0-5 0 - 5 RBC/hpf   WBC, UA 0-5 0 - 5 WBC/hpf   Bacteria, UA RARE (A) NONE SEEN   Squamous Epithelial / LPF 6-10 0 - 5   Mucus PRESENT   Glucose, capillary     Status: None  Collection Time: 02/07/18  1:53 PM  Result Value Ref Range   Glucose-Capillary 70 70 - 99 mg/dL   [place] Renal  Result Date: 02/07/2018 CLINICAL DATA:  Back pain, hematuria EXAM: RENAL / URINARY TRACT ULTRASOUND COMPLETE COMPARISON:  None. FINDINGS: Right Kidney: Length: 11.6 cm. Echogenicity within normal limits. No mass or hydronephrosis visualized. Left Kidney: Length: 11.0 cm. Echogenicity within normal limits. No mass or hydronephrosis visualized. Bladder: Appears normal for degree of bladder distention. IMPRESSION: Normal study. Electronically Signed   By: Charlett NoseKevin  Dover M.D.   On: 02/07/2018 13:48   MAU Course  Procedures Flexeril Heating pad  MDM Labs and US ordered and reviewed. No evidence of kidney stones. Hematuria yesterday, none today, UC pending. No urinary sx. Will wait for culture. Back pain likely MSK d/t lifting, obesity, and grandmultiparity. Pain improved some after Flexeril and heating pad. Encouraged use of maternity belt. No PTL. Glucosuria again today, CBG nml. Stable for discharge home.   Assessment and Plan  [redacted] weeks gestation NST reactive MSK back pain Discharge home Follow up in OB office as scheduled Rx Flexeril Heating pad prn Weight lifting restriction- note provided  Allergies as of 02/07/2018      Reactions   Other Anaphylaxis   "20 different types of trees"   Latex Hives, Itching   Zofran Itching      Medication List    TAKE these medications   acetaminophen 500 MG tablet Commonly known as:  TYLENOL Take 500 mg by mouth every 6 (six) hours as needed for moderate pain or headache.   aspirin 81 MG chewable tablet Chew 1 tablet (81 mg total) by mouth daily.   butalbital-acetaminophen-caffeine  50-325-40 MG tablet Commonly known as:  FIORICET, ESGIC Take 1 tablet by mouth every 8 (eight) hours as needed for headache.   cetirizine 10 MG tablet Commonly known as:  ZYRTEC Take 1 tablet (10 mg total) by mouth daily.   COMFORT FIT MATERNITY SUPP LG Misc 1 Units by Does not apply route daily.   cyclobenzaprine 10 MG tablet Commonly known as:  FLEXERIL Take 1 tablet (10 mg total) by mouth every 8 (eight) hours as needed (back pain). What changed:  reasons to take this   famotidine 20 MG tablet Commonly known as:  PEPCID Take 1 tablet (20 mg total) by mouth 2 (two) times daily.   ferrous sulfate 325 (65 FE) MG tablet Take 1 tablet (325 mg total) by mouth 2 (two) times daily with a meal.   loratadine 10 MG tablet Commonly known as:  CLARITIN Take 10 mg by mouth daily as needed for allergies.   Prenatal Vitamins 0.8 MG tablet Take 1 tablet by mouth daily.      Donette LarryMelanie Ezana Hubbert, CNM 02/07/2018, 12:28 PM

## 2018-02-07 NOTE — MAU Note (Signed)
Pain in back and abd,  Pain is constant getting worse.  Sharp shooting pains in low back.

## 2018-02-07 NOTE — Discharge Instructions (Signed)
Back Pain in Pregnancy Back pain during pregnancy is common. Back pain may be caused by several factors that are related to changes during your pregnancy. Follow these instructions at home: Managing pain, stiffness, and swelling  If directed, apply ice for sudden (acute) back pain. ? Put ice in a plastic bag. ? Place a towel between your skin and the bag. ? Leave the ice on for 20 minutes, 2-3 times per day.  If directed, apply heat to the affected area before you exercise: ? Place a towel between your skin and the heat pack or heating pad. ? Leave the heat on for 20-30 minutes. ? Remove the heat if your skin turns bright red. This is especially important if you are unable to feel pain, heat, or cold. You may have a greater risk of getting burned. Activity  Exercise as told by your health care provider. Exercising is the best way to prevent or manage back pain.  Listen to your body when lifting. If lifting hurts, ask for help or bend your knees. This uses your leg muscles instead of your back muscles.  Squat down when picking up something from the floor. Do not bend over.  Only use bed rest as told by your health care provider. Bed rest should only be used for the most severe episodes of back pain. Standing, Sitting, and Lying Down  Do not stand in one place for long periods of time.  Use good posture when sitting. Make sure your head rests over your shoulders and is not hanging forward. Use a pillow on your lower back if necessary.  Try sleeping on your side, preferably the left side, with a pillow or two between your legs. If you are sore after a night's rest, your bed may be too soft. A firm mattress may provide more support for your back during pregnancy. General instructions  Do not wear high heels.  Eat a healthy diet. Try to gain weight within your health care provider's recommendations.  Use a maternity girdle, elastic sling, or back brace as told by your health care  provider.  Take over-the-counter and prescription medicines only as told by your health care provider.  Keep all follow-up visits as told by your health care provider. This is important. This includes any visits with any specialists, such as a physical therapist. Contact a health care provider if:  Your back pain interferes with your daily activities.  You have increasing pain in other parts of your body. Get help right away if:  You develop numbness, tingling, weakness, or problems with the use of your arms or legs.  You develop severe back pain that is not controlled with medicine.  You have a sudden change in bowel or bladder control.  You develop shortness of breath, dizziness, or you faint.  You develop nausea, vomiting, or sweating.  You have back pain that is a rhythmic, cramping pain similar to labor pains. Labor pain is usually 1-2 minutes apart, lasts for about 1 minute, and involves a bearing down feeling or pressure in your pelvis.  You have back pain and your water breaks or you have vaginal bleeding.  You have back pain or numbness that travels down your leg.  Your back pain developed after you fell.  You develop pain on one side of your back.  You see blood in your urine.  You develop skin blisters in the area of your back pain. This information is not intended to replace advice given to you   by your health care provider. Make sure you discuss any questions you have with your health care provider. Document Released: 08/11/2005 Document Revised: 10/09/2015 Document Reviewed: 01/15/2015 Elsevier Interactive Patient Education  2018 [employer]  

## 2018-02-08 LAB — CULTURE, OB URINE: Culture: 40000 — AB

## 2018-02-09 ENCOUNTER — Encounter: Payer: Self-pay | Admitting: General Practice

## 2018-02-09 ENCOUNTER — Other Ambulatory Visit: Payer: Self-pay | Admitting: Advanced Practice Midwife

## 2018-02-09 MED ORDER — AMPICILLIN 500 MG PO CAPS: 500.0000 mg | ORAL_CAPSULE | Freq: Three times a day (TID) | ORAL | 0 refills | Status: AC

## 2018-02-09 NOTE — Progress Notes (Signed)
GBS identified in urine culture collected 02/06/18. Rx to pharmacy of record. Clinic messaged to call patient and notify her of prescription.  Clayton Bibles, CNM 02/09/18 6:22 AM

## 2018-02-13 ENCOUNTER — Ambulatory Visit (HOSPITAL_COMMUNITY)
Admission: RE | Admit: 2018-02-13 | Discharge: 2018-02-13 | Disposition: A | Discharge status: Home / Self Care | Payer: Medicaid Other | Source: Ambulatory Visit | Attending: Internal Medicine | Admitting: Internal Medicine

## 2018-02-13 DIAGNOSIS — O99213 Obesity complicating pregnancy, third trimester: Secondary | ICD-10-CM

## 2018-02-13 DIAGNOSIS — O09293 Supervision of pregnancy with other poor reproductive or obstetric history, third trimester: Secondary | ICD-10-CM | POA: Diagnosis not present

## 2018-02-13 DIAGNOSIS — O99210 Obesity complicating pregnancy, unspecified trimester: Secondary | ICD-10-CM | POA: Insufficient documentation

## 2018-02-13 DIAGNOSIS — Z3A31 31 weeks gestation of pregnancy: Secondary | ICD-10-CM | POA: Diagnosis not present

## 2018-02-13 DIAGNOSIS — O26843 Uterine size-date discrepancy, third trimester: Secondary | ICD-10-CM

## 2018-02-20 ENCOUNTER — Encounter: Payer: Self-pay | Admitting: *Deleted

## 2018-02-20 ENCOUNTER — Encounter: Payer: Self-pay | Admitting: Advanced Practice Midwife

## 2018-02-20 ENCOUNTER — Ambulatory Visit (INDEPENDENT_AMBULATORY_CARE_PROVIDER_SITE_OTHER): Payer: Medicaid Other | Admitting: Advanced Practice Midwife

## 2018-02-20 VITALS — BP 98/66 | HR 98 | Wt 244.0 lb

## 2018-02-20 DIAGNOSIS — O219 Vomiting of pregnancy, unspecified: Secondary | ICD-10-CM

## 2018-02-20 DIAGNOSIS — Z3480 Encounter for supervision of other normal pregnancy, unspecified trimester: Secondary | ICD-10-CM

## 2018-02-20 LAB — POCT URINALYSIS DIP (DEVICE)
Glucose, UA: 100 mg/dL — AB
HGB URINE DIPSTICK: NEGATIVE
Ketones, ur: 15 mg/dL — AB
LEUKOCYTES UA: NEGATIVE
NITRITE: NEGATIVE
PH: 7 (ref 5.0–8.0)
Protein, ur: 30 mg/dL — AB
SPECIFIC GRAVITY, URINE: 1.025 (ref 1.005–1.030)

## 2018-02-20 MED ORDER — METOCLOPRAMIDE HCL 5 MG/5ML PO SOLN: 10.0000 mg | Freq: Three times a day (TID) | ORAL | 0 refills | Status: DC

## 2018-02-20 NOTE — Progress Notes (Signed)
Pt states has not been feeling well for over a week now.Experiencing nausea,vomiting. No fever or diarrhea.

## 2018-02-20 NOTE — Progress Notes (Signed)
   PRENATAL VISIT NOTE  Subjective:  Laurie Reed is a 32 y.o. [id number] at [redacted]w[redacted]d being seen today for ongoing prenatal care.  She is currently monitored for the following issues for this high-risk pregnancy and has Severe obesity (BMI >= 40) (HCC); Anxiety and depression; Vitamin D deficiency; Asthma; Seasonal allergic rhinitis; Supervision of other normal pregnancy, antepartum; H/O gestational diabetes in prior pregnancy, currently pregnant; Morbid obesity with BMI of 45.0-49.9, adult (HCC); Obesity affecting pregnancy, antepartum; and Anemia in pregnancy on their problem list.  Patient reports nausea, vomiting and lightheadedness. N/V started 10 days ago. States that she's not been able to keep much down.  Not even able to drink water consistently.  Denies abdominal pain.  Also feeling lightheaded since symptoms started.  Denies syncope.  Reports that she has itching with zofran, so would like something different for N/V.  Contractions: Not present. Vag. Bleeding: None.  Movement: Present. Denies leaking of fluid.   The following portions of the patient's history were reviewed and updated as appropriate: allergies, current medications, past family history, past medical history, past social history, past surgical history and problem list. Problem list updated.  Objective:   Vitals:   02/20/18 1048  BP: 98/66  Pulse: 98  Weight: 244 lb (110.7 kg)    Fetal Status: Fetal Heart Rate (bpm): 145   Movement: Present     General:  Alert, oriented and cooperative. Patient is in no acute distress.  Skin: Skin is warm and dry. No rash noted.   Cardiovascular: Normal heart rate noted  Respiratory: Normal respiratory effort, no problems with respiration noted  Abdomen: Soft, gravid, appropriate for gestational age.  Pain/Pressure: Present     Pelvic: Cervical exam deferred        Extremities: Normal range of motion.  Edema: None  Mental Status: Normal mood and affect. Normal behavior. Normal  judgment and thought content.   Assessment and Plan:  Pregnancy: [id number] at [redacted]w[redacted]d  1. Supervision of other normal pregnancy, antepartum - [place] MFM OB FOLLOW UP; Future - Needs Q4w [place] until delivery - BPP weekly starting at 36w - Due to morbid obesity and unknown diabetic status, consider induction at 39 weeks  2. Nausea and vomiting in pregnancy - UA obtained today with 15 ketones, spec grav 1.025 - discussed possibly sending pt to MAU for IV fluids, but given that pt has not tried any antiemetics and vitals stable, will send reglan to pharmacy  - return/ER precautions discussed in detail - follow up PRN   Preterm labor symptoms and general obstetric precautions including but not limited to vaginal bleeding, contractions, leaking of fluid and fetal movement were reviewed in detail with the patient. Please refer to After Visit Summary for other counseling recommendations.  Return in about 2 weeks (around 03/06/2018).  Future Appointments  Date Time Provider Department Center  03/13/2018 10:30 AM [facility] [place] 1 [facility]    Gwenevere Abbot, MD

## 2018-02-27 ENCOUNTER — Inpatient Hospital Stay (HOSPITAL_COMMUNITY)
Admission: AD | Admit: 2018-02-27 | Discharge: 2018-02-27 | Disposition: A | Discharge status: Home / Self Care | Payer: Medicaid Other | Source: Ambulatory Visit | Attending: Obstetrics and Gynecology | Admitting: Obstetrics and Gynecology

## 2018-02-27 ENCOUNTER — Other Ambulatory Visit: Payer: Self-pay

## 2018-02-27 ENCOUNTER — Encounter (HOSPITAL_COMMUNITY): Payer: Self-pay

## 2018-02-27 DIAGNOSIS — Z7982 Long term (current) use of aspirin: Secondary | ICD-10-CM | POA: Insufficient documentation

## 2018-02-27 DIAGNOSIS — M549 Dorsalgia, unspecified: Secondary | ICD-10-CM | POA: Diagnosis present

## 2018-02-27 DIAGNOSIS — Z0371 Encounter for suspected problem with amniotic cavity and membrane ruled out: Secondary | ICD-10-CM

## 2018-02-27 DIAGNOSIS — Z3A33 33 weeks gestation of pregnancy: Secondary | ICD-10-CM | POA: Diagnosis not present

## 2018-02-27 DIAGNOSIS — M545 Low back pain, unspecified: Secondary | ICD-10-CM

## 2018-02-27 DIAGNOSIS — O26893 Other specified pregnancy related conditions, third trimester: Secondary | ICD-10-CM | POA: Insufficient documentation

## 2018-02-27 LAB — URINALYSIS, ROUTINE W REFLEX MICROSCOPIC
Glucose, UA: 50 mg/dL — AB
HGB URINE DIPSTICK: NEGATIVE
Ketones, ur: NEGATIVE mg/dL
LEUKOCYTES UA: NEGATIVE
Nitrite: NEGATIVE
PH: 5 (ref 5.0–8.0)
Protein, ur: 30 mg/dL — AB
SPECIFIC GRAVITY, URINE: 1.031 — AB (ref 1.005–1.030)

## 2018-02-27 LAB — WET PREP, GENITAL
Clue Cells Wet Prep HPF POC: NONE SEEN
Sperm: NONE SEEN
Trich, Wet Prep: NONE SEEN
Yeast Wet Prep HPF POC: NONE SEEN

## 2018-02-27 LAB — POCT FERN TEST: POCT Fern Test: NEGATIVE

## 2018-02-27 LAB — AMNISURE RUPTURE OF MEMBRANE (ROM) NOT AT ARMC: Amnisure ROM: NEGATIVE

## 2018-02-27 MED ORDER — PROMETHAZINE HCL 25 MG PO TABS: 25.0000 mg | ORAL_TABLET | Freq: Four times a day (QID) | ORAL | 0 refills | Status: DC | PRN

## 2018-02-27 MED ORDER — ACETAMINOPHEN 500 MG PO TABS
1000.0000 mg | ORAL_TABLET | Freq: Once | ORAL | Status: AC
  Administered 2018-02-27: 1000 mg via ORAL
  Filled 2018-02-27: qty 2

## 2018-02-27 NOTE — MAU Note (Signed)
Pt presents to MAU with complaints of back pain since this morning. Leakage of fluid on and off all day.

## 2018-02-27 NOTE — MAU Provider Note (Addendum)
History     CSN: [id number]  Arrival date and time: 02/27/18 1630   First Provider Initiated Contact with Patient 02/27/18 1858      Chief Complaint  Patient presents with  . Rupture of Membranes  . Back Pain   Laurie Reed is a 32 y.o. G7P3 at [redacted]w[redacted]d who presents to MAU with complaints of back pain and vaginal discharge. She reports back pain that has been occurring since early pregnancy, has Flexeril prescribed for home use but denies taking medication prior to arrival to MAU, rates pain 8/10. She reports vaginal discharge has been occurring "all day today", describes discharge as white sometimes clear thin discharge. Unsure whether she is ruptured or it is truly discharge. She denies lower abdominal pain, cramping, vaginal bleeding. Report +FM. Request different medication for nausea other than Reglan.    OB History    Gravida  7   Para  3   Term  3   Preterm  0   AB  3   Living  3     SAB  3   TAB  0   Ectopic  0   Multiple  0   Live Births  3           Past Medical History:  Diagnosis Date  . Acid reflux 2004  . Anemia   . Anxiety 2008  . Asthma 1993   no previous intubations or hospitalizations   . Chlamydia   . Depression 2008   no meds, currenly ok  . Environmental allergies   . Migraines   . Miscarriage   . Ovarian cyst   . Pre-diabetes   . Ulcer of the stomach and intestine 2004  . Urinary tract infection     Past Surgical History:  Procedure Laterality Date  . COLONOSCOPY  2004   . IRRIGATION AND DEBRIDEMENT SEBACEOUS CYST N/A 06/01/2017   Procedure: EXCISION SEBACEOUS CYST ON SCALP;  Surgeon: Ancil Linsey, MD;  Location: [facility] ORS;  Service: General;  Laterality: N/A;  . MULTIPLE TOOTH EXTRACTIONS  2015   6 teeth removed   . WISDOM TOOTH EXTRACTION  2007    Family History  Problem Relation Age of Onset  . Asthma Mother   . Heart disease Mother   . Hypertension Mother   . Asthma Maternal Aunt   . Hypertension  Maternal Aunt   . Diabetes Maternal Aunt   . Asthma Maternal Uncle   . Hypertension Maternal Uncle   . Diabetes Maternal Uncle   . Heart disease Maternal Grandmother   . Diabetes Maternal Grandmother   . Hypertension Maternal Grandmother   . Heart disease Maternal Grandfather   . Diabetes Maternal Grandfather   . Hypertension Maternal Grandfather   . Hypertension Paternal Grandmother   . Hypotension Neg Hx   . Anesthesia problems Neg Hx   . Malignant hyperthermia Neg Hx   . Pseudochol deficiency Neg Hx   . Cancer Neg Hx     Social History   Tobacco Use  . Smoking status: Never Smoker  . Smokeless tobacco: Never Used  Substance Use Topics  . Alcohol use: No    Alcohol/week: 0.0 standard drinks  . Drug use: No    Allergies:  Allergies  Allergen Reactions  . Other Anaphylaxis    "20 different types of trees"  . Latex Hives and Itching  . Zofran Itching    Medications Prior to Admission  Medication Sig Dispense Refill Last Dose  . acetaminophen (TYLENOL) 500  MG tablet Take 500 mg by mouth every 6 (six) hours as needed for moderate pain or headache.    Taking  . aspirin 81 MG chewable tablet Chew 1 tablet (81 mg total) by mouth daily. 30 tablet 4 Taking  . butalbital-acetaminophen-caffeine (FIORICET, ESGIC) 50-325-40 MG tablet Take 1 tablet by mouth every 8 (eight) hours as needed for headache. 20 tablet 0 Taking  . cetirizine (ZYRTEC) 10 MG tablet Take 1 tablet (10 mg total) by mouth daily. 30 tablet 0 Taking  . cyclobenzaprine (FLEXERIL) 10 MG tablet Take 1 tablet (10 mg total) by mouth every 8 (eight) hours as needed (back pain). 30 tablet 0 Taking  . Elastic Bandages & Supports (COMFORT FIT MATERNITY SUPP LG) MISC 1 Units by Does not apply route daily. 1 each 0 Taking  . famotidine (PEPCID) 20 MG tablet Take 1 tablet (20 mg total) by mouth 2 (two) times daily. 60 tablet 3 Taking  . ferrous sulfate 325 (65 FE) MG tablet Take 1 tablet (325 mg total) by mouth 2 (two) times  daily with a meal. 60 tablet 3 Taking  . loratadine (CLARITIN) 10 MG tablet Take 10 mg by mouth daily as needed for allergies.    Taking  . metoCLOPramide (REGLAN) 5 MG/5ML solution Take 10 mLs (10 mg total) by mouth 4 (four) times daily -  before meals and at bedtime. 120 mL 0   . Prenatal Multivit-Min-Fe-FA (PRENATAL VITAMINS) 0.8 MG tablet Take 1 tablet by mouth daily. 30 tablet 12 Taking    Review of Systems  Constitutional: Negative.   Respiratory: Negative.   Cardiovascular: Negative.   Gastrointestinal: Negative.   Genitourinary: Positive for vaginal discharge. Negative for decreased urine volume, difficulty urinating, dysuria, frequency and urgency.  Musculoskeletal: Positive for back pain.  Neurological: Negative.    Physical Exam   Blood pressure 123/72, pulse 98, temperature 98.3 F (36.8 C), resp. rate 16, height 5\' 2"  (1.575 m), weight 112 kg, last menstrual period 05/01/2017, SpO2 100 %, unknown if currently breastfeeding.  Physical Exam  Nursing note and vitals reviewed. Constitutional: She is oriented to person, place, and time.  Cardiovascular: Normal rate, regular rhythm and normal heart sounds.  Respiratory: Effort normal and breath sounds normal. No respiratory distress. She has no wheezes.  GI: Soft. There is no tenderness. There is no rebound and no guarding.  Gravid large for gestational age  Genitourinary: No bleeding in the vagina. Vaginal discharge found.  Genitourinary Comments: Pelvic exam: cervix pink, visually open, large amount of white thin discharge without odor present, small amount of pooling on speculum.   Musculoskeletal: Normal range of motion. She exhibits no edema.  Neurological: She is alert and oriented to person, place, and time.  Skin: Skin is warm and dry.  Psychiatric: She has a normal mood and affect. Her behavior is normal. Thought content normal.   Dilation: 1 Effacement (%): Thick Cervical Position: Posterior Exam by:: Rodgers  CNM  FHR: 135/ moderate/ +accels/ no decels  Toco: no uterine contractions  MAU Course  Procedures Results for orders placed or performed during the hospital encounter of 02/27/18 (from the past 48 hour(s))  Urinalysis, Routine w reflex microscopic     Status: Abnormal   Collection Time: 02/27/18  5:18 PM  Result Value Ref Range   Color, Urine YELLOW YELLOW   APPearance HAZY (A) CLEAR   Specific Gravity, Urine 1.031 (H) 1.005 - 1.030   pH 5.0 5.0 - 8.0   Glucose, UA 50 (A) NEGATIVE  mg/dL   Hgb urine dipstick NEGATIVE NEGATIVE   Bilirubin Urine SMALL (A) NEGATIVE   Ketones, ur NEGATIVE NEGATIVE mg/dL   Protein, ur 30 (A) NEGATIVE mg/dL   Nitrite NEGATIVE NEGATIVE   Leukocytes, UA NEGATIVE NEGATIVE   RBC / HPF 0-5 0 - 5 RBC/hpf   WBC, UA 0-5 0 - 5 WBC/hpf   Bacteria, UA RARE (A) NONE SEEN   Squamous Epithelial / LPF 6-10 0 - 5   Mucus PRESENT     Comment: Performed at [facility], [address], [place], [place] [address]  Wet prep, genital     Status: Abnormal   Collection Time: 02/27/18  7:12 PM  Result Value Ref Range   Yeast Wet Prep HPF POC NONE SEEN NONE SEEN   Trich, Wet Prep NONE SEEN NONE SEEN   Clue Cells Wet Prep HPF POC NONE SEEN NONE SEEN   WBC, Wet Prep HPF POC MODERATE (A) NONE SEEN    Comment: MANY BACTERIA SEEN   Sperm NONE SEEN     Comment: Performed at [facility], [address], [place], [place] [address]  POCT fern test     Status: None   Collection Time: 02/27/18  7:14 PM  Result Value Ref Range   POCT Fern Test Negative = intact amniotic membranes   Amnisure rupture of membrane (rom)not at [facility]     Status: None   Collection Time: 02/27/18  8:12 PM  Result Value Ref Range   Amnisure ROM NEGATIVE     Comment: Performed at [facility], [address], [place], [place] [address]    MDM Orders Placed This Encounter  Procedures  . Wet prep, genital  . Urinalysis, Routine w reflex microscopic  . Amnisure rupture of membrane (rom)not  at [facility]  . POCT fern test   Wet prep negative  Fern negative  Amnisure pending   Treatments in MAU included 1,000mg  Tylenol for back pain due to patient driving  Care taken over by [facility] NP at 2006  Sharyon Cable, CNM 02/27/18, 8:06 PM   Amnisure negative No ctx on monitor Reactive NST  Assessment and Plan  A:  1. Encounter for suspected PROM, with rupture of membranes not found   2. [redacted] weeks gestation of pregnancy   3. Low back pain during pregnancy in third trimester    P: Discharge home in stable condition Rx phenergan per patient request Discussed reasons to return to MAU Encouraged patient to wear maternity support belt  Judeth Horn, NP

## 2018-02-27 NOTE — Discharge Instructions (Signed)
Back Pain in Pregnancy Back pain during pregnancy is common. Back pain may be caused by several factors that are related to changes during your pregnancy. Follow these instructions at home: Managing pain, stiffness, and swelling  If directed, apply ice for sudden (acute) back pain. ? Put ice in a plastic bag. ? Place a towel between your skin and the bag. ? Leave the ice on for 20 minutes, 2-3 times per day.  If directed, apply heat to the affected area before you exercise: ? Place a towel between your skin and the heat pack or heating pad. ? Leave the heat on for 20-30 minutes. ? Remove the heat if your skin turns bright red. This is especially important if you are unable to feel pain, heat, or cold. You may have a greater risk of getting burned. Activity  Exercise as told by your health care provider. Exercising is the best way to prevent or manage back pain.  Listen to your body when lifting. If lifting hurts, ask for help or bend your knees. This uses your leg muscles instead of your back muscles.  Squat down when picking up something from the floor. Do not bend over.  Only use bed rest as told by your health care provider. Bed rest should only be used for the most severe episodes of back pain. Standing, Sitting, and Lying Down  Do not stand in one place for long periods of time.  Use good posture when sitting. Make sure your head rests over your shoulders and is not hanging forward. Use a pillow on your lower back if necessary.  Try sleeping on your side, preferably the left side, with a pillow or two between your legs. If you are sore after a night's rest, your bed may be too soft. A firm mattress may provide more support for your back during pregnancy. General instructions  Do not wear high heels.  Eat a healthy diet. Try to gain weight within your health care provider's recommendations.  Use a maternity girdle, elastic sling, or back brace as told by your health care  provider.  Take over-the-counter and prescription medicines only as told by your health care provider.  Keep all follow-up visits as told by your health care provider. This is important. This includes any visits with any specialists, such as a physical therapist. Contact a health care provider if:  Your back pain interferes with your daily activities.  You have increasing pain in other parts of your body. Get help right away if:  You develop numbness, tingling, weakness, or problems with the use of your arms or legs.  You develop severe back pain that is not controlled with medicine.  You have a sudden change in bowel or bladder control.  You develop shortness of breath, dizziness, or you faint.  You develop nausea, vomiting, or sweating.  You have back pain that is a rhythmic, cramping pain similar to labor pains. Labor pain is usually 1-2 minutes apart, lasts for about 1 minute, and involves a bearing down feeling or pressure in your pelvis.  You have back pain and your water breaks or you have vaginal bleeding.  You have back pain or numbness that travels down your leg.  Your back pain developed after you fell.  You develop pain on one side of your back.  You see blood in your urine.  You develop skin blisters in the area of your back pain. This information is not intended to replace advice given to you   by your health care provider. Make sure you discuss any questions you have with your health care provider. Document Released: 08/11/2005 Document Revised: 10/09/2015 Document Reviewed: 01/15/2015 Elsevier Interactive Patient Education  2018 [employer]. Fetal Movement Counts Patient Name: ________________________________________________ Patient Due Date: ____________________ What is a fetal movement count? A fetal movement count is the number of times that you feel your baby move during a certain amount of time. This may also be called a fetal kick count. A fetal movement  count is recommended for every pregnant woman. You may be asked to start counting fetal movements as early as week 28 of your pregnancy. Pay attention to when your baby is most active. You may notice your baby's sleep and wake cycles. You may also notice things that make your baby move more. You should do a fetal movement count:  When your baby is normally most active.  At the same time each day.  A good time to count movements is while you are resting, after having something to eat and drink. How do I count fetal movements? 1. Find a quiet, comfortable area. Sit, or lie down on your side. 2. Write down the date, the start time and stop time, and the number of movements that you felt between those two times. Take this information with you to your health care visits. 3. For 2 hours, count kicks, flutters, swishes, rolls, and jabs. You should feel at least 10 movements during 2 hours. 4. You may stop counting after you have felt 10 movements. 5. If you do not feel 10 movements in 2 hours, have something to eat and drink. Then, keep resting and counting for 1 hour. If you feel at least 4 movements during that hour, you may stop counting. Contact a health care provider if:  You feel fewer than 4 movements in 2 hours.  Your baby is not moving like he or she usually does. Date: ____________ Start time: ____________ Stop time: ____________ Movements: ____________ Date: ____________ Start time: ____________ Stop time: ____________ Movements: ____________ Date: ____________ Start time: ____________ Stop time: ____________ Movements: ____________ Date: ____________ Start time: ____________ Stop time: ____________ Movements: ____________ Date: ____________ Start time: ____________ Stop time: ____________ Movements: ____________ Date: ____________ Start time: ____________ Stop time: ____________ Movements: ____________ Date: ____________ Start time: ____________ Stop time: ____________ Movements:  ____________ Date: ____________ Start time: ____________ Stop time: ____________ Movements: ____________ Date: ____________ Start time: ____________ Stop time: ____________ Movements: ____________ This information is not intended to replace advice given to you by your health care provider. Make sure you discuss any questions you have with your health care provider. Document Released: 06/02/2006 Document Revised: 12/31/2015 Document Reviewed: 06/12/2015 Elsevier Interactive Patient Education  [employer].

## 2018-02-27 NOTE — MAU Note (Signed)
Pt is a G7P3 at 33.6 week c/o back pain and pressure with new discharge and possible SROM.  Pt has been unable to take Glucola test d/t N&V.

## 2018-03-02 ENCOUNTER — Encounter: Payer: Self-pay | Admitting: Family Medicine

## 2018-03-02 ENCOUNTER — Other Ambulatory Visit (HOSPITAL_COMMUNITY)
Admission: RE | Admit: 2018-03-02 | Discharge: 2018-03-02 | Disposition: A | Discharge status: Home / Self Care | Payer: Medicaid Other | Source: Ambulatory Visit | Attending: Family Medicine | Admitting: Family Medicine

## 2018-03-02 ENCOUNTER — Ambulatory Visit (INDEPENDENT_AMBULATORY_CARE_PROVIDER_SITE_OTHER): Payer: Medicaid Other | Admitting: Family Medicine

## 2018-03-02 ENCOUNTER — Telehealth: Payer: Self-pay | Admitting: *Deleted

## 2018-03-02 VITALS — BP 127/60 | HR 93 | Wt 246.1 lb

## 2018-03-02 DIAGNOSIS — N898 Other specified noninflammatory disorders of vagina: Secondary | ICD-10-CM | POA: Diagnosis present

## 2018-03-02 DIAGNOSIS — Z3480 Encounter for supervision of other normal pregnancy, unspecified trimester: Secondary | ICD-10-CM

## 2018-03-02 NOTE — Patient Instructions (Addendum)
PREGNANCY SUPPORT BELT: You are not alone, Seventy-five percent of women have some sort of abdominal or back pain at some point in their pregnancy. Your baby is growing at a fast pace, which means that your whole body is rapidly trying to adjust to the changes. As your uterus grows, your back may start feeling a bit under stress and this can result in back or abdominal pain that can go from mild, and therefore bearable, to severe pains that will not allow you to sit or lay down comfortably, When it comes to dealing with pregnancy-related pains and cramps, some pregnant women usually prefer natural remedies, which the market is filled with nowadays. For example, wearing a pregnancy support belt can help ease and lessen your discomfort and pain. WHAT ARE THE BENEFITS OF WEARING A PREGNANCY SUPPORT BELT? A pregnancy support belt provides support to the lower portion of the belly taking some of the weight of the growing uterus and distributing to the other parts of your body. It is designed make you comfortable and gives you extra support. Over the years, the pregnancy apparel market has been studying the needs and wants of pregnant women and they have come up with the most comfortable pregnancy support belts that woman could ever ask for. In fact, you will no longer have to wear a stretched-out or bulky pregnancy belt that is visible underneath your clothes and makes you feel even more uncomfortable. Nowadays, a pregnancy support belt is made of comfortable and stretchy materials that will not irritate your skin but will actually make you feel at ease and you will not even notice you are wearing it. They are easy to put on and adjust during the day and can be worn at night for additional support.  BENEFITS: . Relives Back pain . Relieves Abdominal Muscle and Leg Pain . Stabilizes the Pelvic Ring . Offers a Cushioned Abdominal Lift Pad . Relieves pressure on the Sciatic Nerve Within Minutes WHERE TO GET  YOUR PREGNANCY BELT: [employer] [phone] @[id number]  [address] [place], [place] [address]    Breastfeeding Choosing to breastfeed is one of the best decisions you can make for yourself and your baby. A change in hormones during pregnancy causes your breasts to make breast milk in your milk-producing glands. Hormones prevent breast milk from being released before your baby is born. They also prompt milk flow after birth. Once breastfeeding has begun, thoughts of your baby, as well as his or her sucking or crying, can stimulate the release of milk from your milk-producing glands. Benefits of breastfeeding Research shows that breastfeeding offers many health benefits for infants and mothers. It also offers a cost-free and convenient way to feed your baby. For your baby  Your first milk (colostrum) helps your baby's digestive system to function better.  Special cells in your milk (antibodies) help your baby to fight off infections.  Breastfed babies are less likely to develop asthma, allergies, obesity, or type 2 diabetes. They are also at lower risk for sudden infant death syndrome (SIDS).  Nutrients in breast milk are better able to meet your baby's needs compared to infant formula.  Breast milk improves your baby's brain development. For you  Breastfeeding helps to create a very special bond between you and your baby.  Breastfeeding is convenient. Breast milk costs nothing and is always available at the correct temperature.  Breastfeeding helps to burn calories. It helps you to lose the weight that you gained during pregnancy.  Breastfeeding makes your uterus return faster to its size before pregnancy. It also slows bleeding (lochia) after you give birth.  Breastfeeding helps to lower your risk of developing type 2 diabetes, osteoporosis, rheumatoid arthritis, cardiovascular disease, and breast, ovarian, uterine, and endometrial cancer later in life. Breastfeeding  basics Starting breastfeeding  Find a comfortable place to sit or lie down, with your neck and back well-supported.  Place a pillow or a rolled-up blanket under your baby to bring him or her to the level of your breast (if you are seated). Nursing pillows are specially designed to help support your arms and your baby while you breastfeed.  Make sure that your baby's tummy (abdomen) is facing your abdomen.  Gently massage your breast. With your fingertips, massage from the outer edges of your breast inward toward the nipple. This encourages milk flow. If your milk flows slowly, you may need to continue this action during the feeding.  Support your breast with 4 fingers underneath and your thumb above your nipple (make the letter "C" with your hand). Make sure your fingers are well away from your nipple and your baby's mouth.  Stroke your baby's lips gently with your finger or nipple.  When your baby's mouth is open wide enough, quickly bring your baby to your breast, placing your entire nipple and as much of the areola as possible into your baby's mouth. The areola is the colored area around your nipple. ? More areola should be visible above your baby's upper lip than below the lower lip. ? Your baby's lips should be opened and extended outward (flanged) to ensure an adequate, comfortable latch. ? Your baby's tongue should be between his or her lower gum and your breast.  Make sure that your baby's mouth is correctly positioned around your nipple (latched). Your baby's lips should create a seal on your breast and be turned out (everted).  It is common for your baby to suck about 2-3 minutes in order to start the flow of breast milk. Latching Teaching your baby how to latch onto your breast properly is very important. An improper latch can cause nipple pain, decreased milk supply, and poor weight gain in your baby. Also, if your baby is not latched onto your nipple properly, he or she may  swallow some air during feeding. This can make your baby fussy. Burping your baby when you switch breasts during the feeding can help to get rid of the air. However, teaching your baby to latch on properly is still the best way to prevent fussiness from swallowing air while breastfeeding. Signs that your baby has successfully latched onto your nipple  Silent tugging or silent sucking, without causing you pain. Infant's lips should be extended outward (flanged).  Swallowing heard between every 3-4 sucks once your milk has started to flow (after your let-down milk reflex occurs).  Muscle movement above and in front of his or her ears while sucking.  Signs that your baby has not successfully latched onto your nipple  Sucking sounds or smacking sounds from your baby while breastfeeding.  Nipple pain.  If you think your baby has not latched on correctly, slip your finger into the corner of your baby's mouth to break the suction and place it between your baby's gums. Attempt to start breastfeeding again. Signs of successful breastfeeding Signs from your baby  Your baby will gradually decrease the number of sucks or will completely stop sucking.  Your baby will fall asleep.  Your baby's body  will relax.  Your baby will retain a small amount of milk in his or her mouth.  Your baby will let go of your breast by himself or herself.  Signs from you  Breasts that have increased in firmness, weight, and size 1-3 hours after feeding.  Breasts that are softer immediately after breastfeeding.  Increased milk volume, as well as a change in milk consistency and color by the fifth day of breastfeeding.  Nipples that are not sore, cracked, or bleeding.  Signs that your baby is getting enough milk  Wetting at least 1-2 diapers during the first 24 hours after birth.  Wetting at least 5-6 diapers every 24 hours for the first week after birth. The urine should be clear or pale yellow by the age of  5 days.  Wetting 6-8 diapers every 24 hours as your baby continues to grow and develop.  At least 3 stools in a 24-hour period by the age of 5 days. The stool should be soft and yellow.  At least 3 stools in a 24-hour period by the age of 7 days. The stool should be seedy and yellow.  No loss of weight greater than 10% of birth weight during the first 3 days of life.  Average weight gain of 4-7 oz (113-198 g) per week after the age of 4 days.  Consistent daily weight gain by the age of 5 days, without weight loss after the age of 2 weeks. After a feeding, your baby may spit up a small amount of milk. This is normal. Breastfeeding frequency and duration Frequent feeding will help you make more milk and can prevent sore nipples and extremely full breasts (breast engorgement). Breastfeed when you feel the need to reduce the fullness of your breasts or when your baby shows signs of hunger. This is called "breastfeeding on demand." Signs that your baby is hungry include:  Increased alertness, activity, or restlessness.  Movement of the head from side to side.  Opening of the mouth when the corner of the mouth or cheek is stroked (rooting).  Increased sucking sounds, smacking lips, cooing, sighing, or squeaking.  Hand-to-mouth movements and sucking on fingers or hands.  Fussing or crying.  Avoid introducing a pacifier to your baby in the first 4-6 weeks after your baby is born. After this time, you may choose to use a pacifier. Research has shown that pacifier use during the first year of a baby's life decreases the risk of sudden infant death syndrome (SIDS). Allow your baby to feed on each breast as long as he or she wants. When your baby unlatches or falls asleep while feeding from the first breast, offer the second breast. Because newborns are often sleepy in the first few weeks of life, you may need to awaken your baby to get him or her to feed. Breastfeeding times will vary from baby  to baby. However, the following rules can serve as a guide to help you make sure that your baby is properly fed:  Newborns (babies 24 weeks of age or younger) may breastfeed every 1-3 hours.  Newborns should not go without breastfeeding for longer than 3 hours during the day or 5 hours during the night.  You should breastfeed your baby a minimum of 8 times in a 24-hour period.  Breast milk pumping Pumping and storing breast milk allows you to make sure that your baby is exclusively fed your breast milk, even at times when you are unable to breastfeed. This is  especially important if you go back to work while you are still breastfeeding, or if you are not able to be present during feedings. Your lactation consultant can help you find a method of pumping that works best for you and give you guidelines about how long it is safe to store breast milk. Caring for your breasts while you breastfeed Nipples can become dry, cracked, and sore while breastfeeding. The following recommendations can help keep your breasts moisturized and healthy:  Avoid using soap on your nipples.  Wear a supportive bra designed especially for nursing. Avoid wearing underwire-style bras or extremely tight bras (sports bras).  Air-dry your nipples for 3-4 minutes after each feeding.  Use only cotton bra pads to absorb leaked breast milk. Leaking of breast milk between feedings is normal.  Use lanolin on your nipples after breastfeeding. Lanolin helps to maintain your skin's normal moisture barrier. Pure lanolin is not harmful (not toxic) to your baby. You may also hand express a few drops of breast milk and gently massage that milk into your nipples and allow the milk to air-dry.  In the first few weeks after giving birth, some women experience breast engorgement. Engorgement can make your breasts feel heavy, warm, and tender to the touch. Engorgement peaks within 3-5 days after you give birth. The following recommendations  can help to ease engorgement:  Completely empty your breasts while breastfeeding or pumping. You may want to start by applying warm, moist heat (in the shower or with warm, water-soaked hand towels) just before feeding or pumping. This increases circulation and helps the milk flow. If your baby does not completely empty your breasts while breastfeeding, pump any extra milk after he or she is finished.  Apply ice packs to your breasts immediately after breastfeeding or pumping, unless this is too uncomfortable for you. To do this: ? Put ice in a plastic bag. ? Place a towel between your skin and the bag. ? Leave the ice on for 20 minutes, 2-3 times a day.  Make sure that your baby is latched on and positioned properly while breastfeeding.  If engorgement persists after 48 hours of following these recommendations, contact your health care provider or a Advertising copywriter. Overall health care recommendations while breastfeeding  Eat 3 healthy meals and 3 snacks every day. Well-nourished mothers who are breastfeeding need an additional 450-500 calories a day. You can meet this requirement by increasing the amount of a balanced diet that you eat.  Drink enough water to keep your urine pale yellow or clear.  Rest often, relax, and continue to take your prenatal vitamins to prevent fatigue, stress, and low vitamin and mineral levels in your body (nutrient deficiencies).  Do not use any products that contain nicotine or tobacco, such as cigarettes and e-cigarettes. Your baby may be harmed by chemicals from cigarettes that pass into breast milk and exposure to secondhand smoke. If you need help quitting, ask your health care provider.  Avoid alcohol.  Do not use illegal drugs or marijuana.  Talk with your health care provider before taking any medicines. These include over-the-counter and prescription medicines as well as vitamins and herbal supplements. Some medicines that may be harmful to your  baby can pass through breast milk.  It is possible to become pregnant while breastfeeding. If birth control is desired, ask your health care provider about options that will be safe while breastfeeding your baby. Where to find more information: [employer]: www.llli.org Contact a health care  provider if:  You feel like you want to stop breastfeeding or have become frustrated with breastfeeding.  Your nipples are cracked or bleeding.  Your breasts are red, tender, or warm.  You have: ? Painful breasts or nipples. ? A swollen area on either breast. ? A fever or chills. ? Nausea or vomiting. ? Drainage other than breast milk from your nipples.  Your breasts do not become full before feedings by the fifth day after you give birth.  You feel sad and depressed.  Your baby is: ? Too sleepy to eat well. ? Having trouble sleeping. ? More than 56 week old and wetting fewer than 6 diapers in a 24-hour period. ? Not gaining weight by 9 days of age.  Your baby has fewer than 3 stools in a 24-hour period.  Your baby's skin or the white parts of his or her eyes become yellow. Get help right away if:  Your baby is overly tired (lethargic) and does not want to wake up and feed.  Your baby develops an unexplained fever. Summary  Breastfeeding offers many health benefits for infant and mothers.  Try to breastfeed your infant when he or she shows early signs of hunger.  Gently tickle or stroke your baby's lips with your finger or nipple to allow the baby to open his or her mouth. Bring the baby to your breast. Make sure that much of the areola is in your baby's mouth. Offer one side and burp the baby before you offer the other side.  Talk with your health care provider or lactation consultant if you have questions or you face problems as you breastfeed. This information is not intended to replace advice given to you by your health care provider. Make sure you discuss any  questions you have with your health care provider. Document Released: 05/03/2005 Document Revised: 06/04/2016 Document Reviewed: 06/04/2016 Elsevier Interactive Patient Education  [employer].

## 2018-03-02 NOTE — Telephone Encounter (Signed)
Received a voice message this afternoon from [facility] Client Information Re: disability claim. Stated in order to make a benefit decision they need to verify information provided by patient. Need [place] to call [phone] re: claim #[id number]

## 2018-03-03 LAB — CERVICOVAGINAL ANCILLARY ONLY
Bacterial vaginitis: NEGATIVE
Candida vaginitis: POSITIVE — AB
Chlamydia: NEGATIVE
NEISSERIA GONORRHEA: NEGATIVE
TRICH (WINDOWPATH): NEGATIVE

## 2018-03-03 MED ORDER — TERCONAZOLE 0.8 % VA CREA: 1.0000 | TOPICAL_CREAM | Freq: Every day | VAGINAL | 0 refills | Status: DC

## 2018-03-03 NOTE — Progress Notes (Addendum)
   PRENATAL VISIT NOTE  Subjective:  Laurie Reed is a 32 y.o. [phone] at [redacted]w[redacted]d being seen today for ongoing prenatal care.  She is currently monitored for the following issues for this low-risk pregnancy and has Severe obesity (BMI >= 40) (HCC); Anxiety and depression; Vitamin D deficiency; Asthma; Seasonal allergic rhinitis; Supervision of other normal pregnancy, antepartum; H/O gestational diabetes in prior pregnancy, currently pregnant; Morbid obesity with BMI of 45.0-49.9, adult (HCC); Obesity affecting pregnancy, antepartum; and Anemia in pregnancy on their problem list.  Patient reports backache and leaking vaginal discharge..  Contractions: Irregular. Vag. Bleeding: None.  Movement: Present. Denies leaking of fluid.   The following portions of the patient's history were reviewed and updated as appropriate: allergies, current medications, past family history, past medical history, past social history, past surgical history and problem list. Problem list updated.  Objective:   Vitals:   03/02/18 1319  BP: 127/60  Pulse: 93  Weight: 246 lb 1.6 oz (111.6 kg)    Fetal Status: Fetal Heart Rate (bpm): 140   Movement: Present     General:  Alert, oriented and cooperative. Patient is in no acute distress.  Skin: Skin is warm and dry. No rash noted.   Cardiovascular: Normal heart rate noted  Respiratory: Normal respiratory effort, no problems with respiration noted  Abdomen: Soft, gravid, appropriate for gestational age.  Pain/Pressure: Present     Pelvic: Cervical exam performed Dilation: 1 Effacement (%): 10 Station: -3Neg pool and neg fern  Extremities: Normal range of motion.  Edema: None  Mental Status: Normal mood and affect. Normal behavior. Normal judgment and thought content.   Assessment and Plan:  Pregnancy: [id number] at [redacted]w[redacted]d  1. Supervision of other normal pregnancy, antepartum Continue routine prenatal care.   2. Vaginal discharge Check cultures No evidence of  ROM - Cervicovaginal ancillary only  Preterm labor symptoms and general obstetric precautions including but not limited to vaginal bleeding, contractions, leaking of fluid and fetal movement were reviewed in detail with the patient. Please refer to After Visit Summary for other counseling recommendations.  Return in 2 weeks (on 03/16/2018).  Future Appointments  Date Time Provider Department Center  03/06/2018  1:15 PM Armando Reichert CNM [facility]  03/13/2018 10:30 AM [facility] [place] 1 [facility]    Reva Bores, MD

## 2018-03-03 NOTE — Addendum Note (Signed)
Addended by: Reva Bores on: 03/03/2018 04:34 PM   Modules accepted: Orders

## 2018-03-06 ENCOUNTER — Encounter: Payer: Self-pay | Admitting: Advanced Practice Midwife

## 2018-03-06 ENCOUNTER — Telehealth: Payer: Self-pay | Admitting: *Deleted

## 2018-03-06 ENCOUNTER — Ambulatory Visit (INDEPENDENT_AMBULATORY_CARE_PROVIDER_SITE_OTHER): Payer: Medicaid Other | Admitting: Advanced Practice Midwife

## 2018-03-06 VITALS — BP 101/51 | HR 98 | Wt 242.6 lb

## 2018-03-06 DIAGNOSIS — Z3483 Encounter for supervision of other normal pregnancy, third trimester: Secondary | ICD-10-CM | POA: Diagnosis not present

## 2018-03-06 DIAGNOSIS — O368130 Decreased fetal movements, third trimester, not applicable or unspecified: Secondary | ICD-10-CM

## 2018-03-06 DIAGNOSIS — Z3480 Encounter for supervision of other normal pregnancy, unspecified trimester: Secondary | ICD-10-CM

## 2018-03-06 MED ORDER — BENZONATATE 100 MG PO CAPS: 100.0000 mg | ORAL_CAPSULE | Freq: Three times a day (TID) | ORAL | 0 refills | Status: DC | PRN

## 2018-03-06 NOTE — Patient Instructions (Signed)
Vaginal delivery means that you will give birth by pushing your baby out of your birth canal (vagina). A team of health care providers will help you before, during, and after vaginal delivery. Birth experiences are unique for every woman and every pregnancy, and birth experiences vary depending on where you choose to give birth. What should I do to prepare for my baby's birth? Before your baby is born, it is important to talk with your health care provider about:  Your labor and delivery preferences. These may include: ? Medicines that you may be given. ? How you will manage your pain. This might include non-medical pain relief techniques or injectable pain relief such as epidural analgesia. ? How you and your baby will be monitored during labor and delivery. ? Who may be in the labor and delivery room with you. ? Your feelings about surgical delivery of your baby (cesarean delivery, or C-section) if this becomes necessary. ? Your feelings about receiving donated blood through an IV tube (blood transfusion) if this becomes necessary.  Whether you are able: ? To take pictures or videos of the birth. ? To eat during labor and delivery. ? To move around, walk, or change positions during labor and delivery.  What to expect after your baby is born, such as: ? Whether delayed umbilical cord clamping and cutting is offered. ? Who will care for your baby right after birth. ? Medicines or tests that may be recommended for your baby. ? Whether breastfeeding is supported in your hospital or birth center. ? How long you will be in the hospital or birth center.  How any medical conditions you have may affect your baby or your labor and delivery experience.  To prepare for your baby's birth, you should also:  Attend all of your health care visits before delivery (prenatal visits) as recommended by your health care provider. This is important.  Prepare your home for your baby's arrival. Make sure  that you have: ? Diapers. ? Baby clothing. ? Feeding equipment. ? Safe sleeping arrangements for you and your baby.  Install a car seat in your vehicle. Have your car seat checked by a certified car seat installer to make sure that it is installed safely.  Think about who will help you with your new baby at home for at least the first several weeks after delivery.  What can I expect when I arrive at the birth center or hospital? Once you are in labor and have been admitted into the hospital or birth center, your health care provider may:  Review your pregnancy history and any concerns you have.  Insert an IV tube into one of your veins. This is used to give you fluids and medicines.  Check your blood pressure, pulse, temperature, and heart rate (vital signs).  Check whether your bag of water (amniotic sac) has broken (ruptured).  Talk with you about your birth plan and discuss pain control options.  Monitoring Your health care provider may monitor your contractions (uterine monitoring) and your baby's heart rate (fetal monitoring). You may need to be monitored:  Often, but not continuously (intermittently).  All the time or for long periods at a time (continuously). Continuous monitoring may be needed if: ? You are taking certain medicines, such as medicine to relieve pain or make your contractions stronger. ? You have pregnancy or labor complications.  Monitoring may be done by:  Placing a special stethoscope or a handheld monitoring device on your abdomen to check your   baby's heartbeat, and feeling your abdomen for contractions. This method of monitoring does not continuously record your baby's heartbeat or your contractions.  Placing monitors on your abdomen (external monitors) to record your baby's heartbeat and the frequency and length of contractions. You may not have to wear external monitors all the time.  Placing monitors inside of your uterus (internal monitors) to  record your baby's heartbeat and the frequency, length, and strength of your contractions. ? Your health care provider may use internal monitors if he or she needs more information about the strength of your contractions or your baby's heart rate. ? Internal monitors are put in place by passing a thin, flexible wire through your vagina and into your uterus. Depending on the type of monitor, it may remain in your uterus or on your baby's head until birth. ? Your health care provider will discuss the benefits and risks of internal monitoring with you and will ask for your permission before inserting the monitors.  Telemetry. This is a type of continuous monitoring that can be done with external or internal monitors. Instead of having to stay in bed, you are able to move around during telemetry. Ask your health care provider if telemetry is an option for you.  Physical exam Your health care provider may perform a physical exam. This may include:  Checking whether your baby is positioned: ? With the head toward your vagina (head-down). This is most common. ? With the head toward the top of your uterus (head-up or breech). If your baby is in a breech position, your health care provider may try to turn your baby to a head-down position so you can deliver vaginally. If it does not seem that your baby can be born vaginally, your provider may recommend surgery to deliver your baby. In rare cases, you may be able to deliver vaginally if your baby is head-up (breech delivery). ? Lying sideways (transverse). Babies that are lying sideways cannot be delivered vaginally.  Checking your cervix to determine: ? Whether it is thinning out (effacing). ? Whether it is opening up (dilating). ? How low your baby has moved into your birth canal.  What are the three stages of labor and delivery?  Normal labor and delivery is divided into the following three stages: Stage 1  Stage 1 is the longest stage of labor,  and it can last for hours or days. Stage 1 includes: ? Early labor. This is when contractions may be irregular, or regular and mild. Generally, early labor contractions are more than 10 minutes apart. ? Active labor. This is when contractions get longer, more regular, more frequent, and more intense. ? The transition phase. This is when contractions happen very close together, are very intense, and may last longer than during any other part of labor.  Contractions generally feel mild, infrequent, and irregular at first. They get stronger, more frequent (about every 2-3 minutes), and more regular as you progress from early labor through active labor and transition.  Many women progress through stage 1 naturally, but you may need help to continue making progress. If this happens, your health care provider may talk with you about: ? Rupturing your amniotic sac if it has not ruptured yet. ? Giving you medicine to help make your contractions stronger and more frequent.  Stage 1 ends when your cervix is completely dilated to 4 inches (10 cm) and completely effaced. This happens at the end of the transition phase. Stage 2  Once your cervix   is completely effaced and dilated to 4 inches (10 cm), you may start to feel an urge to push. It is common for the body to naturally take a rest before feeling the urge to push, especially if you received an epidural or certain other pain medicines. This rest period may last for up to 1-2 hours, depending on your unique labor experience.  During stage 2, contractions are generally less painful, because pushing helps relieve contraction pain. Instead of contraction pain, you may feel stretching and burning pain, especially when the widest part of your baby's head passes through the vaginal opening (crowning).  Your health care provider will closely monitor your pushing progress and your baby's progress through the vagina during stage 2.  Your health care provider may  massage the area of skin between your vaginal opening and anus (perineum) or apply warm compresses to your perineum. This helps it stretch as the baby's head starts to crown, which can help prevent perineal tearing. ? In some cases, an incision may be made in your perineum (episiotomy) to allow the baby to pass through the vaginal opening. An episiotomy helps to make the opening of the vagina larger to allow more room for the baby to fit through.  It is very important to breathe and focus so your health care provider can control the delivery of your baby's head. Your health care provider may have you decrease the intensity of your pushing, to help prevent perineal tearing.  After delivery of your baby's head, the shoulders and the rest of the body generally deliver very quickly and without difficulty.  Once your baby is delivered, the umbilical cord may be cut right away, or this may be delayed for 1-2 minutes, depending on your baby's health. This may vary among health care providers, hospitals, and birth centers.  If you and your baby are healthy enough, your baby may be placed on your chest or abdomen to help maintain the baby's temperature and to help you bond with each other. Some mothers and babies start breastfeeding at this time. Your health care team will dry your baby and help keep your baby warm during this time.  Your baby may need immediate care if he or she: ? Showed signs of distress during labor. ? Has a medical condition. ? Was born too early (prematurely). ? Had a bowel movement before birth (meconium). ? Shows signs of difficulty transitioning from being inside the uterus to being outside of the uterus. If you are planning to breastfeed, your health care team will help you begin a feeding. Stage 3  The third stage of labor starts immediately after the birth of your baby and ends after you deliver the placenta. The placenta is an organ that develops during pregnancy to provide  oxygen and nutrients to your baby in the womb.  Delivering the placenta may require some pushing, and you may have mild contractions. Breastfeeding can stimulate contractions to help you deliver the placenta.  After the placenta is delivered, your uterus should tighten (contract) and become firm. This helps to stop bleeding in your uterus. To help your uterus contract and to control bleeding, your health care provider may: ? Give you medicine by injection, through an IV tube, by mouth, or through your rectum (rectally). ? Massage your abdomen or perform a vaginal exam to remove any blood clots that are left in your uterus. ? Empty your bladder by placing a thin, flexible tube (catheter) into your bladder. ? Encourage you to   breastfeed your baby. After labor is over, you and your baby will be monitored closely to ensure that you are both healthy until you are ready to go home. Your health care team will teach you how to care for yourself and your baby. This information is not intended to replace advice given to you by your health care provider. Make sure you discuss any questions you have with your health care provider. Document Released: 02/10/2008 Document Revised: 11/21/2015 Document Reviewed: 05/18/2015 Elsevier Interactive Patient Education  2018 [employer]  

## 2018-03-06 NOTE — Telephone Encounter (Signed)
Received a voice message from [place] at [employer] disability stating they have a claim for disability. States they understand she is pregnant and due 03/18/18 and taken off of work 03/02/18. States they need to confirm if she was taken out of work and for what reason.

## 2018-03-06 NOTE — Telephone Encounter (Addendum)
Per chart review, no mention of pt being taken out of work on 10/17. Dr. Shawnie Pons saw pt in office on that day. Message sent to her for review/advice.   10/22  0950  Response received from Dr. Shawnie Pons stating that she had given letter to pt on 10/17 which contained our standard pregnancy restrictions. If her work cannot accommodate the restrictions, pt may need to be taken out of work. I called [employer] and spoke with a disability claim specialist. I advised of the information provided by Dr. Shawnie Pons as previously stated. She informed me that pt has told them she was taken out of work as of 10/17. I recommended [employer] follow up with pt's employer to determine if they are able to comply with the pregnancy restrictions. She voiced understanding and expressed appreciation for the information provided.

## 2018-03-06 NOTE — Progress Notes (Signed)
   PRENATAL VISIT NOTE  Subjective:  Laurie Reed is a 32 y.o. [id number] at [redacted]w[redacted]d being seen today for ongoing prenatal care.  She is currently monitored for the following issues for this low-risk pregnancy and has Anxiety and depression; Vitamin D deficiency; Asthma; Seasonal allergic rhinitis; Supervision of other normal pregnancy, antepartum; H/O gestational diabetes in prior pregnancy, currently pregnant; Morbid obesity with BMI of 45.0-49.9, adult (HCC); Obesity affecting pregnancy, antepartum; and Anemia in pregnancy on their problem list.  Patient reports no complaints.  Contractions: Irregular. Vag. Bleeding: None.  Movement: (!) Decreased. Denies leaking of fluid.   The following portions of the patient's history were reviewed and updated as appropriate: allergies, current medications, past family history, past medical history, past social history, past surgical history and problem list. Problem list updated.  Objective:   Vitals:   03/06/18 1339  BP: (!) 101/51  Pulse: 98  Weight: 242 lb 9.6 oz (110 kg)    Fetal Status: Fetal Heart Rate (bpm): 140 Fundal Height: 38 cm Movement: (!) Decreased     General:  Alert, oriented and cooperative. Patient is in no acute distress.  Skin: Skin is warm and dry. No rash noted.   Cardiovascular: Normal heart rate noted  Respiratory: Normal respiratory effort, no problems with respiration noted  Abdomen: Soft, gravid, appropriate for gestational age.  Pain/Pressure: Present     Pelvic: Cervical exam deferred        Extremities: Normal range of motion.  Edema: None  Mental Status: Normal mood and affect. Normal behavior. Normal judgment and thought content.   Assessment and Plan:  Pregnancy: [id number] at [redacted]w[redacted]d  1. Supervision of other normal pregnancy, antepartum - Routine care - NST today 2/2 decreased fetal movement  - S>D today, has growth [place] on 03/13/17. - Tessalon for cough sent to pharmacy   Preterm labor symptoms and general  obstetric precautions including but not limited to vaginal bleeding, contractions, leaking of fluid and fetal movement were reviewed in detail with the patient. Please refer to After Visit Summary for other counseling recommendations.  Return in about 2 weeks (around 03/20/2018).  Future Appointments  Date Time Provider Department Center  03/13/2018 10:30 AM [facility] [place] 1 [facility]    Thressa Sheller, CNM

## 2018-03-06 NOTE — Telephone Encounter (Signed)
I returned the call and provided pt's EDD of 04/11/18. No other information was requested.

## 2018-03-13 ENCOUNTER — Ambulatory Visit (HOSPITAL_COMMUNITY)
Admission: RE | Admit: 2018-03-13 | Discharge: 2018-03-13 | Disposition: A | Discharge status: Home / Self Care | Payer: Medicaid Other | Source: Ambulatory Visit | Attending: Advanced Practice Midwife | Admitting: Advanced Practice Midwife

## 2018-03-13 DIAGNOSIS — O99213 Obesity complicating pregnancy, third trimester: Secondary | ICD-10-CM | POA: Diagnosis not present

## 2018-03-13 DIAGNOSIS — O09293 Supervision of pregnancy with other poor reproductive or obstetric history, third trimester: Secondary | ICD-10-CM | POA: Diagnosis not present

## 2018-03-13 DIAGNOSIS — Z3A35 35 weeks gestation of pregnancy: Secondary | ICD-10-CM

## 2018-03-13 DIAGNOSIS — Z3480 Encounter for supervision of other normal pregnancy, unspecified trimester: Secondary | ICD-10-CM | POA: Diagnosis present

## 2018-03-13 DIAGNOSIS — O26843 Uterine size-date discrepancy, third trimester: Secondary | ICD-10-CM | POA: Diagnosis not present

## 2018-03-18 ENCOUNTER — Encounter (HOSPITAL_COMMUNITY): Payer: Self-pay | Admitting: *Deleted

## 2018-03-18 ENCOUNTER — Inpatient Hospital Stay (HOSPITAL_COMMUNITY)
Admission: AD | Admit: 2018-03-18 | Discharge: 2018-03-19 | Disposition: A | Discharge status: Home / Self Care | Payer: Medicaid Other | Attending: Obstetrics and Gynecology | Admitting: Obstetrics and Gynecology

## 2018-03-18 DIAGNOSIS — Z3A36 36 weeks gestation of pregnancy: Secondary | ICD-10-CM | POA: Insufficient documentation

## 2018-03-18 DIAGNOSIS — B373 Candidiasis of vulva and vagina: Secondary | ICD-10-CM

## 2018-03-18 DIAGNOSIS — O98813 Other maternal infectious and parasitic diseases complicating pregnancy, third trimester: Secondary | ICD-10-CM | POA: Diagnosis not present

## 2018-03-18 DIAGNOSIS — B379 Candidiasis, unspecified: Secondary | ICD-10-CM | POA: Diagnosis not present

## 2018-03-18 DIAGNOSIS — M545 Low back pain: Secondary | ICD-10-CM | POA: Diagnosis present

## 2018-03-18 DIAGNOSIS — O212 Late vomiting of pregnancy: Secondary | ICD-10-CM | POA: Diagnosis not present

## 2018-03-18 DIAGNOSIS — O219 Vomiting of pregnancy, unspecified: Secondary | ICD-10-CM

## 2018-03-18 LAB — COMPREHENSIVE METABOLIC PANEL
ALBUMIN: 2.7 g/dL — AB (ref 3.5–5.0)
ALT: 9 U/L (ref 0–44)
AST: 13 U/L — AB (ref 15–41)
Alkaline Phosphatase: 126 U/L (ref 38–126)
Anion gap: 8 (ref 5–15)
BILIRUBIN TOTAL: 0.4 mg/dL (ref 0.3–1.2)
BUN: 7 mg/dL (ref 6–20)
CHLORIDE: 106 mmol/L (ref 98–111)
CO2: 23 mmol/L (ref 22–32)
CREATININE: 0.62 mg/dL (ref 0.44–1.00)
Calcium: 8.4 mg/dL — ABNORMAL LOW (ref 8.9–10.3)
GFR calc Af Amer: >60 mL/min (ref >60)
Glucose, Bld: 93 mg/dL (ref 70–99)
Potassium: 3.5 mmol/L (ref 3.5–5.1)
Sodium: 137 mmol/L (ref 135–145)
Total Protein: 6.4 g/dL — ABNORMAL LOW (ref 6.5–8.1)

## 2018-03-18 LAB — GLUCOSE, CAPILLARY: GLUCOSE-CAPILLARY: 84 mg/dL (ref 70–99)

## 2018-03-18 LAB — URINALYSIS, ROUTINE W REFLEX MICROSCOPIC
BILIRUBIN URINE: NEGATIVE
Bacteria, UA: NONE SEEN
Glucose, UA: 150 mg/dL — AB
Hgb urine dipstick: NEGATIVE
Ketones, ur: NEGATIVE mg/dL
LEUKOCYTES UA: NEGATIVE
Nitrite: NEGATIVE
PH: 5 (ref 5.0–8.0)
Protein, ur: 30 mg/dL — AB
SPECIFIC GRAVITY, URINE: 1.027 (ref 1.005–1.030)

## 2018-03-18 LAB — WET PREP, GENITAL
Clue Cells Wet Prep HPF POC: NONE SEEN
Sperm: NONE SEEN
TRICH WET PREP: NONE SEEN

## 2018-03-18 MED ORDER — PROMETHAZINE HCL 25 MG/ML IJ SOLN
25.0000 mg | Freq: Once | INTRAVENOUS | Status: AC
  Administered 2018-03-18: 25 mg via INTRAVENOUS
  Filled 2018-03-18: qty 1

## 2018-03-18 MED ORDER — SCOPOLAMINE 1 MG/3DAYS TD PT72
1.0000 | MEDICATED_PATCH | TRANSDERMAL | Status: DC
  Administered 2018-03-18: 1.5 mg via TRANSDERMAL
  Filled 2018-03-18: qty 1

## 2018-03-18 MED ORDER — FAMOTIDINE IN NACL 20-0.9 MG/50ML-% IV SOLN
20.0000 mg | Freq: Once | INTRAVENOUS | Status: AC
  Administered 2018-03-18: 20 mg via INTRAVENOUS
  Filled 2018-03-18: qty 50

## 2018-03-18 MED ORDER — TERCONAZOLE 0.4 % VA CREA: 1.0000 | TOPICAL_CREAM | Freq: Every day | VAGINAL | 0 refills | Status: DC

## 2018-03-18 NOTE — MAU Provider Note (Addendum)
History     CSN: [id number]  Arrival date and time: 03/18/18 1914   First Provider Initiated Contact with Patient 03/18/18 2020      Chief Complaint  Patient presents with  . Vaginal Discharge  . Back Pain  . Nausea   HPI  Ms.  Laurie Reed is a 32 y.o. year old [id number] female at [redacted]w[redacted]d weeks gestation who presents to MAU reporting lower back pain and pressure, some abdominal pain/cramping, nausea for several weeks, weakness and numbness in LT leg. The pain runs down the back of her LT butt cheek down her LT leg. She's taking Reglan during the day and Phenergan hs; without any relief. She reports "not being able to hold anything down. She reports that she "tried to eat some nuggets on the way here." She also complains of white d/c with no odor. She denies VB or LOF. She was dx'd with yeast on 03/02/18.   Past Medical History:  Diagnosis Date  . Acid reflux 2004  . Anemia   . Anxiety 2008  . Asthma 1993   no previous intubations or hospitalizations   . Chlamydia   . Depression 2008   no meds, currenly ok  . Environmental allergies   . Migraines   . Miscarriage   . Ovarian cyst   . Pre-diabetes   . Ulcer of the stomach and intestine 2004  . Urinary tract infection     Past Surgical History:  Procedure Laterality Date  . COLONOSCOPY  2004   . IRRIGATION AND DEBRIDEMENT SEBACEOUS CYST N/A 06/01/2017   Procedure: EXCISION SEBACEOUS CYST ON SCALP;  Surgeon: Ancil Linsey, MD;  Location: [facility] ORS;  Service: General;  Laterality: N/A;  . MULTIPLE TOOTH EXTRACTIONS  2015   6 teeth removed   . WISDOM TOOTH EXTRACTION  2007    Family History  Problem Relation Age of Onset  . Asthma Mother   . Heart disease Mother   . Hypertension Mother   . Asthma Maternal Aunt   . Hypertension Maternal Aunt   . Diabetes Maternal Aunt   . Asthma Maternal Uncle   . Hypertension Maternal Uncle   . Diabetes Maternal Uncle   . Heart disease Maternal Grandmother   . Diabetes  Maternal Grandmother   . Hypertension Maternal Grandmother   . Heart disease Maternal Grandfather   . Diabetes Maternal Grandfather   . Hypertension Maternal Grandfather   . Hypertension Paternal Grandmother   . Hypotension Neg Hx   . Anesthesia problems Neg Hx   . Malignant hyperthermia Neg Hx   . Pseudochol deficiency Neg Hx   . Cancer Neg Hx     Social History   Tobacco Use  . Smoking status: Never Smoker  . Smokeless tobacco: Never Used  Substance Use Topics  . Alcohol use: No    Alcohol/week: 0.0 standard drinks  . Drug use: No    Allergies:  Allergies  Allergen Reactions  . Other Anaphylaxis    "20 different types of trees"  . Latex Hives and Itching  . Zofran Itching    Medications Prior to Admission  Medication Sig Dispense Refill Last Dose  . acetaminophen (TYLENOL) 500 MG tablet Take 500 mg by mouth every 6 (six) hours as needed for moderate pain or headache.    Taking  . aspirin 81 MG chewable tablet Chew 1 tablet (81 mg total) by mouth daily. 30 tablet 4 Taking  . benzonatate (TESSALON PERLES) 100 MG capsule Take 1 capsule (100 mg  total) by mouth 3 (three) times daily as needed for cough. 30 capsule 0   . butalbital-acetaminophen-caffeine (FIORICET, ESGIC) 50-325-40 MG tablet Take 1 tablet by mouth every 8 (eight) hours as needed for headache. (Patient not taking: Reported on 03/06/2018) 20 tablet 0 Not Taking  . cetirizine (ZYRTEC) 10 MG tablet Take 1 tablet (10 mg total) by mouth daily. 30 tablet 0 Taking  . cyclobenzaprine (FLEXERIL) 10 MG tablet Take 1 tablet (10 mg total) by mouth every 8 (eight) hours as needed (back pain). 30 tablet 0 Taking  . Elastic Bandages & Supports (COMFORT FIT MATERNITY SUPP LG) MISC 1 Units by Does not apply route daily. 1 each 0 Taking  . famotidine (PEPCID) 20 MG tablet Take 1 tablet (20 mg total) by mouth 2 (two) times daily. 60 tablet 3 Taking  . ferrous sulfate 325 (65 FE) MG tablet Take 1 tablet (325 mg total) by mouth 2  (two) times daily with a meal. 60 tablet 3 Taking  . loratadine (CLARITIN) 10 MG tablet Take 10 mg by mouth daily as needed for allergies.    Taking  . metoCLOPramide (REGLAN) 5 MG/5ML solution Take 10 mLs (10 mg total) by mouth 4 (four) times daily -  before meals and at bedtime. 120 mL 0 Taking  . Prenatal Multivit-Min-Fe-FA (PRENATAL VITAMINS) 0.8 MG tablet Take 1 tablet by mouth daily. 30 tablet 12 Taking  . promethazine (PHENERGAN) 25 MG tablet Take 1 tablet (25 mg total) by mouth every 6 (six) hours as needed for nausea or vomiting. 30 tablet 0 Taking   Review of Systems  Constitutional: Negative.   HENT: Negative.   Eyes: Negative.   Respiratory: Negative.   Cardiovascular: Negative.   Gastrointestinal: Positive for abdominal pain (cramping).  Endocrine: Negative.   Genitourinary: Negative.   Musculoskeletal: Positive for back pain (lower).  Skin: Negative.   Allergic/Immunologic: Negative.   Neurological: Positive for weakness (LT leg), light-headedness and numbness (LT leg).  Hematological: Negative.   Psychiatric/Behavioral: Negative.    Physical Exam   Patient Vitals for the past 24 hrs:  BP Temp Pulse Resp Height Weight  03/18/18 1937 (!) 110/58 - (!) 108 - - -  03/18/18 1934 - 98.1 F (36.7 C) - 18 5\' 2"  (1.575 m) 111.6 kg     Physical Exam  Nursing note and vitals reviewed. Constitutional: She is oriented to person, place, and time. She appears well-developed and well-nourished.  HENT:  Head: Normocephalic and atraumatic.  Eyes: Pupils are equal, round, and reactive to light.  Neck: Normal range of motion.  Cardiovascular: Normal rate.  Respiratory: Effort normal.  GI: Soft.  Genitourinary:  Genitourinary Comments: Uterus: gravid, S=D, SE: cervix is smooth, pink, no lesions, small amt of thick, white vaginal d/c -- WP done // Dilation: 1 cm / Thick / High / Posterior / Exam by: Carloyn Jaeger, CNM   Musculoskeletal: Normal range of motion.  Neurological: She is  alert and oriented to person, place, and time.  Skin: Skin is warm and dry.  Psychiatric: She has a normal mood and affect. Her behavior is normal. Judgment and thought content normal.    MAU Course  Procedures  MDM CCUA Orthostatic VS Wet Prep Phenergan 25 mg in LR 1000 ml @ bolus rate  NST - FHR: 140 bpm / moderate variability / accels present / decels absent / TOCO: irregular UC's with UI noted   Results for orders placed or performed during the hospital encounter of 03/18/18 (from the past  24 hour(s))  Urinalysis, Routine w reflex microscopic     Status: Abnormal   Collection Time: 03/18/18  7:45 PM  Result Value Ref Range   Color, Urine YELLOW YELLOW   APPearance CLEAR CLEAR   Specific Gravity, Urine 1.027 1.005 - 1.030   pH 5.0 5.0 - 8.0   Glucose, UA 150 (A) NEGATIVE mg/dL   Hgb urine dipstick NEGATIVE NEGATIVE   Bilirubin Urine NEGATIVE NEGATIVE   Ketones, ur NEGATIVE NEGATIVE mg/dL   Protein, ur 30 (A) NEGATIVE mg/dL   Nitrite NEGATIVE NEGATIVE   Leukocytes, UA NEGATIVE NEGATIVE   RBC / HPF 0-5 0 - 5 RBC/hpf   WBC, UA 0-5 0 - 5 WBC/hpf   Bacteria, UA NONE SEEN NONE SEEN   Squamous Epithelial / LPF 0-5 0 - 5   Mucus PRESENT   Wet prep, genital     Status: Abnormal   Collection Time: 03/18/18  8:32 PM  Result Value Ref Range   Yeast Wet Prep HPF POC PRESENT (A) NONE SEEN   Trich, Wet Prep NONE SEEN NONE SEEN   Clue Cells Wet Prep HPF POC NONE SEEN NONE SEEN   WBC, Wet Prep HPF POC FEW (A) NONE SEEN   Sperm NONE SEEN      Report given to and care assumed by Blanche East, FNP  Raelyn Mora, MSN, CNM 03/18/2018, 8:35 PM   Patient's fluids complete Patient attempted to eat crackers and Oral fluid. Patient vomitied Scopolamine patch applied and 20 mg of pepcid Patient feels better and is ready to go home   Assessment and Plan   A:  1. Nausea and vomiting in pregnancy   2. Yeast infection     P:  Discharge home in stable condition Return to MAU if  symptoms worsen Rx: Terazol Take nausea medication at home.   Duane Lope, NP 03/18/2018 11:54 PM

## 2018-03-18 NOTE — MAU Note (Addendum)
Having lower back pressure and some abd cramping. Have several nausea meds at home but last several wks not helping much. Weak today and some numbness L leg. Taking Reglan during day and phenergan at night and not helping. Some white vag d/c. NO odor. No vag bleeding or LOF. Pain in back runs down L butt cheek and down L leg

## 2018-03-18 NOTE — Discharge Instructions (Signed)
Nausea and Vomiting, Adult Feeling sick to your stomach (nausea) means that your stomach is upset or you feel like you have to throw up (vomit). Feeling more and more sick to your stomach can lead to throwing up. Throwing up happens when food and liquid from your stomach are thrown up and out the mouth. Throwing up can make you feel weak and cause you to get dehydrated. Dehydration can make you tired and thirsty, make you have a dry mouth, and make it so you pee (urinate) less often. Older adults and people with other diseases or a weak defense system (immune system) are at higher risk for dehydration. If you feel sick to your stomach or if you throw up, it is important to follow instructions from your doctor about how to take care of yourself. Follow these instructions at home: Eating and drinking Follow these instructions as told by your doctor:  Take an oral rehydration solution (ORS). This is a drink that is sold at pharmacies and stores.  Drink clear fluids in small amounts as you are able, such as: ? Water. ? Ice chips. ? Diluted fruit juice. ? Low-calorie sports drinks.  Eat bland, easy-to-digest foods in small amounts as you are able, such as: ? Bananas. ? Applesauce. ? Rice. ? Low-fat (lean) meats. ? Toast. ? Crackers.  Avoid fluids that have a lot of sugar or caffeine in them.  Avoid alcohol.  Avoid spicy or fatty foods.  General instructions  Drink enough fluid to keep your pee (urine) clear or pale yellow.  Wash your hands often. If you cannot use soap and water, use hand sanitizer.  Make sure that all people in your home wash their hands well and often.  Take over-the-counter and prescription medicines only as told by your doctor.  Rest at home while you get better.  Watch your condition for any changes.  Breathe slowly and deeply when you feel sick to your stomach.  Keep all follow-up visits as told by your doctor. This is important. Contact a doctor  if:  You have a fever.  You cannot keep fluids down.  Your symptoms get worse.  You have new symptoms.  You feel sick to your stomach for more than two days.  You feel light-headed or dizzy.  You have a headache.  You have muscle cramps. Get help right away if:  You have pain in your chest, neck, arm, or jaw.  You feel very weak or you pass out (faint).  You throw up again and again.  You see blood in your throw-up.  Your throw-up looks like black coffee grounds.  You have bloody or black poop (stools) or poop that look like tar.  You have a very bad headache, a stiff neck, or both.  You have a rash.  You have very bad pain, cramping, or bloating in your belly (abdomen).  You have trouble breathing.  You are breathing very quickly.  Your heart is beating very quickly.  Your skin feels cold and clammy.  You feel confused.  You have pain when you pee.  You have signs of dehydration, such as: ? Dark pee, hardly any pee, or no pee. ? Cracked lips. ? Dry mouth. ? Sunken eyes. ? Sleepiness. ? Weakness. These symptoms may be an emergency. Do not wait to see if the symptoms will go away. Get medical help right away. Call your local emergency services (911 in the [place] Do not drive yourself to the hospital. This information is   not intended to replace advice given to you by your health care provider. Make sure you discuss any questions you have with your health care provider. Document Released: 10/20/2007 Document Revised: 11/21/2015 Document Reviewed: 01/07/2015 Elsevier Interactive Patient Education  2018 Elsevier Inc.  

## 2018-03-20 ENCOUNTER — Ambulatory Visit (INDEPENDENT_AMBULATORY_CARE_PROVIDER_SITE_OTHER): Payer: Medicaid Other | Admitting: Obstetrics & Gynecology

## 2018-03-20 ENCOUNTER — Other Ambulatory Visit (HOSPITAL_COMMUNITY)
Admission: RE | Admit: 2018-03-20 | Discharge: 2018-03-20 | Disposition: A | Discharge status: Home / Self Care | Payer: Medicaid Other | Source: Ambulatory Visit | Attending: Obstetrics & Gynecology | Admitting: Obstetrics & Gynecology

## 2018-03-20 VITALS — BP 117/72 | HR 87

## 2018-03-20 DIAGNOSIS — Z3480 Encounter for supervision of other normal pregnancy, unspecified trimester: Secondary | ICD-10-CM | POA: Insufficient documentation

## 2018-03-20 DIAGNOSIS — O219 Vomiting of pregnancy, unspecified: Secondary | ICD-10-CM

## 2018-03-20 DIAGNOSIS — O99210 Obesity complicating pregnancy, unspecified trimester: Secondary | ICD-10-CM

## 2018-03-20 DIAGNOSIS — N760 Acute vaginitis: Secondary | ICD-10-CM

## 2018-03-20 DIAGNOSIS — O23593 Infection of other part of genital tract in pregnancy, third trimester: Secondary | ICD-10-CM

## 2018-03-20 LAB — OB RESULTS CONSOLE GC/CHLAMYDIA: GC PROBE AMP, GENITAL: NEGATIVE

## 2018-03-20 MED ORDER — SCOPOLAMINE 1 MG/3DAYS TD PT72: 1.0000 | MEDICATED_PATCH | TRANSDERMAL | 12 refills | Status: DC

## 2018-03-20 NOTE — Progress Notes (Signed)
   PRENATAL VISIT NOTE  Subjective:  Laurie Reed is a 32 y.o. [phone] at [redacted]w[redacted]d being seen today for ongoing prenatal care.  She is currently monitored for the following issues for this low-risk pregnancy and has Anxiety and depression; Vitamin D deficiency; Asthma; Seasonal allergic rhinitis; Supervision of other normal pregnancy, antepartum; H/O gestational diabetes in prior pregnancy, currently pregnant; Morbid obesity with BMI of 45.0-49.9, adult (HCC); Maternal morbid obesity, antepartum (HCC); and Anemia in pregnancy on their problem list.  Patient reports that her severe nausea and vomiting is managed by Scopolamine patch, desires refill.  She is miserable and requests a 39 week induction, she says she was promised this will be done.  Contractions: Irregular. Vag. Bleeding: None.  Movement: Present. Denies leaking of fluid.   The following portions of the patient's history were reviewed and updated as appropriate: allergies, current medications, past family history, past medical history, past social history, past surgical history and problem list. Problem list updated.  Objective:   Vitals:   03/20/18 1133  BP: 117/72  Pulse: 87    Fetal Status: Fetal Heart Rate (bpm): 135   Movement: Present  Presentation: Vertex  General:  Alert, oriented and cooperative. Patient is in no acute distress.  Skin: Skin is warm and dry. No rash noted.   Cardiovascular: Normal heart rate noted  Respiratory: Normal respiratory effort, no problems with respiration noted  Abdomen: Soft, gravid, appropriate for gestational age.  Pain/Pressure: Present     Pelvic: Cervical exam performed Dilation: Fingertip Effacement (%): Thick Station: Ballotable  Extremities: Normal range of motion.  Edema: Trace  Mental Status: Normal mood and affect. Normal behavior. Normal judgment and thought content.   Assessment and Plan:  Pregnancy: [id number] at [redacted]w[redacted]d  1. Maternal morbid obesity, antepartum (HCC) TWG -5  lb, EFW is good. Unable to do 2 hr GTT. Normal fasting CBG checks.  2. Nausea and vomiting of pregnancy, antepartum Refill given. - scopolamine (TRANSDERM-SCOP, 1.5 MG,) 1 MG/3DAYS; Place 1 patch (1.5 mg total) onto the skin every 3 (three) days.  Dispense: 10 patch; Refill: 12  3. Supervision of other normal pregnancy, antepartum Pelvic cultures done today - Strep Gp B NAA - Cervicovaginal ancillary only IOL to be scheduled at 39 weeks given patient's insistence. Labor symptoms and general obstetric precautions including but not limited to vaginal bleeding, contractions, leaking of fluid and fetal movement were reviewed in detail with the patient. Please refer to After Visit Summary for other counseling recommendations.  Return in about 1 week (around 03/27/2018) for OB Visit.  Future Appointments  Date Time Provider Department Center  03/27/2018 10:15 AM Conan Bowens, MD [facility]  04/03/2018  9:15 AM Adam Phenix, MD [facility]  04/10/2018  9:15 AM Adam Phenix, MD [facility]OC    Jaynie Collins, MD

## 2018-03-20 NOTE — Patient Instructions (Signed)
Return to clinic for any scheduled appointments or obstetric concerns, or go to MAU for evaluation  

## 2018-03-21 ENCOUNTER — Encounter (HOSPITAL_COMMUNITY): Payer: Self-pay | Admitting: *Deleted

## 2018-03-21 ENCOUNTER — Telehealth (HOSPITAL_COMMUNITY): Payer: Self-pay | Admitting: *Deleted

## 2018-03-21 LAB — CERVICOVAGINAL ANCILLARY ONLY
Bacterial vaginitis: NEGATIVE
CHLAMYDIA, DNA PROBE: NEGATIVE
Candida vaginitis: POSITIVE — AB
Neisseria Gonorrhea: NEGATIVE
Trichomonas: NEGATIVE

## 2018-03-21 NOTE — Telephone Encounter (Signed)
Preadmission screen  

## 2018-03-22 ENCOUNTER — Encounter: Payer: Self-pay | Admitting: Obstetrics & Gynecology

## 2018-03-22 ENCOUNTER — Encounter: Payer: Self-pay | Admitting: *Deleted

## 2018-03-22 DIAGNOSIS — O99820 Streptococcus B carrier state complicating pregnancy: Secondary | ICD-10-CM | POA: Insufficient documentation

## 2018-03-22 LAB — STREP GP B NAA: Strep Gp B NAA: POSITIVE — AB

## 2018-03-22 MED ORDER — TERCONAZOLE 0.8 % VA CREA: 1.0000 | TOPICAL_CREAM | Freq: Every day | VAGINAL | 0 refills | Status: DC

## 2018-03-22 NOTE — Addendum Note (Signed)
Addended by: Jaynie Collins A on: 03/22/2018 05:16 PM   Modules accepted: Orders

## 2018-03-27 ENCOUNTER — Ambulatory Visit (INDEPENDENT_AMBULATORY_CARE_PROVIDER_SITE_OTHER): Payer: Medicaid Other | Admitting: Obstetrics and Gynecology

## 2018-03-27 VITALS — BP 104/67 | HR 112 | Wt 241.7 lb

## 2018-03-27 DIAGNOSIS — O09293 Supervision of pregnancy with other poor reproductive or obstetric history, third trimester: Secondary | ICD-10-CM

## 2018-03-27 DIAGNOSIS — O09299 Supervision of pregnancy with other poor reproductive or obstetric history, unspecified trimester: Secondary | ICD-10-CM

## 2018-03-27 DIAGNOSIS — O99210 Obesity complicating pregnancy, unspecified trimester: Secondary | ICD-10-CM

## 2018-03-27 DIAGNOSIS — Z8632 Personal history of gestational diabetes: Secondary | ICD-10-CM

## 2018-03-27 DIAGNOSIS — Z3483 Encounter for supervision of other normal pregnancy, third trimester: Secondary | ICD-10-CM

## 2018-03-27 DIAGNOSIS — O99213 Obesity complicating pregnancy, third trimester: Secondary | ICD-10-CM

## 2018-03-27 DIAGNOSIS — Z6841 Body Mass Index (BMI) 40.0 and over, adult: Secondary | ICD-10-CM

## 2018-03-27 DIAGNOSIS — Z3009 Encounter for other general counseling and advice on contraception: Secondary | ICD-10-CM

## 2018-03-27 DIAGNOSIS — O99820 Streptococcus B carrier state complicating pregnancy: Secondary | ICD-10-CM

## 2018-03-27 DIAGNOSIS — Z3480 Encounter for supervision of other normal pregnancy, unspecified trimester: Secondary | ICD-10-CM

## 2018-03-27 NOTE — Progress Notes (Signed)
   PRENATAL VISIT NOTE  Subjective:  Laurie Reed is a 32 y.o. [phone] at [redacted]w[redacted]d being seen today for ongoing prenatal care.  She is currently monitored for the following issues for this high-risk pregnancy and has Anxiety and depression; Vitamin D deficiency; Asthma; Seasonal allergic rhinitis; Supervision of other normal pregnancy, antepartum; H/O gestational diabetes in prior pregnancy, currently pregnant; Morbid obesity with BMI of 45.0-49.9, adult (HCC); Maternal morbid obesity, antepartum (HCC); Anemia in pregnancy; Group B Streptococcus carrier, +RV culture, currently pregnant; and Unwanted fertility on their problem list.  Patient reports vaginal discharge.  Contractions: Irregular. Vag. Bleeding: None.  Movement: Present. Denies leaking of fluid.   The following portions of the patient's history were reviewed and updated as appropriate: allergies, current medications, past family history, past medical history, past social history, past surgical history and problem list. Problem list updated.  Objective:   Vitals:   03/27/18 1021  BP: 104/67  Pulse: (!) 112  Weight: 241 lb 11.2 oz (109.6 kg)    Fetal Status: Fetal Heart Rate (bpm): 143   Movement: Present     General:  Alert, oriented and cooperative. Patient is in no acute distress.  Skin: Skin is warm and dry. No rash noted.   Cardiovascular: Normal heart rate noted  Respiratory: Normal respiratory effort, no problems with respiration noted  Abdomen: Soft, gravid, appropriate for gestational age.  Pain/Pressure: Present     Pelvic: Cervical exam performed 1/20/-3        Extremities: Normal range of motion.  Edema: Trace  Mental Status: Normal mood and affect. Normal behavior. Normal judgment and thought content.   Assessment and Plan:  Pregnancy: [id number] at [redacted]w[redacted]d  1. Supervision of other normal pregnancy, antepartum Patient unable to tolerate 2 hr GTT, A1c wnl Last growth 56th%tile  2. H/O gestational diabetes in  prior pregnancy, currently pregnant  3. Group B Streptococcus carrier, +RV culture, currently pregnant ppx in labor  4. Morbid obesity with BMI of 45.0-49.9, adult (HCC) Stopped baby Asa  5. Unwanted fertility For BTL, reviewed surgery/course today and she is very desirous of BTL   Term labor symptoms and general obstetric precautions including but not limited to vaginal bleeding, contractions, leaking of fluid and fetal movement were reviewed in detail with the patient. Please refer to After Visit Summary for other counseling recommendations.  Return in about 1 week (around 04/03/2018) for OB visit (MD).  Future Appointments  Date Time Provider Department Center  04/03/2018  9:15 AM Adam Phenix, MD [facility]  04/04/2018 12:00 AM [facility] ROOM [facility] None  04/10/2018  9:15 AM Adam Phenix, MD [facility]    Conan Bowens, MD

## 2018-04-03 ENCOUNTER — Other Ambulatory Visit: Payer: Self-pay

## 2018-04-03 ENCOUNTER — Encounter (HOSPITAL_COMMUNITY): Payer: Self-pay | Admitting: *Deleted

## 2018-04-03 ENCOUNTER — Observation Stay (HOSPITAL_COMMUNITY): Payer: Medicaid Other | Admitting: Anesthesiology

## 2018-04-03 ENCOUNTER — Ambulatory Visit (INDEPENDENT_AMBULATORY_CARE_PROVIDER_SITE_OTHER): Payer: Medicaid Other | Admitting: Obstetrics & Gynecology

## 2018-04-03 ENCOUNTER — Inpatient Hospital Stay (HOSPITAL_COMMUNITY)
Admission: AD | Admit: 2018-04-03 | Discharge: 2018-04-06 | DRG: 796 | Disposition: A | Discharge status: Home / Self Care | Payer: Medicaid Other | Attending: Obstetrics and Gynecology | Admitting: Obstetrics and Gynecology

## 2018-04-03 VITALS — BP 111/69 | HR 103 | Wt 241.0 lb

## 2018-04-03 DIAGNOSIS — J45909 Unspecified asthma, uncomplicated: Secondary | ICD-10-CM | POA: Diagnosis present

## 2018-04-03 DIAGNOSIS — O99820 Streptococcus B carrier state complicating pregnancy: Secondary | ICD-10-CM

## 2018-04-03 DIAGNOSIS — Z302 Encounter for sterilization: Secondary | ICD-10-CM

## 2018-04-03 DIAGNOSIS — Z8632 Personal history of gestational diabetes: Secondary | ICD-10-CM | POA: Diagnosis not present

## 2018-04-03 DIAGNOSIS — O09299 Supervision of pregnancy with other poor reproductive or obstetric history, unspecified trimester: Secondary | ICD-10-CM

## 2018-04-03 DIAGNOSIS — Z3A38 38 weeks gestation of pregnancy: Secondary | ICD-10-CM | POA: Diagnosis not present

## 2018-04-03 DIAGNOSIS — O99214 Obesity complicating childbirth: Secondary | ICD-10-CM | POA: Diagnosis present

## 2018-04-03 DIAGNOSIS — O3663X0 Maternal care for excessive fetal growth, third trimester, not applicable or unspecified: Principal | ICD-10-CM | POA: Diagnosis present

## 2018-04-03 DIAGNOSIS — F329 Major depressive disorder, single episode, unspecified: Secondary | ICD-10-CM | POA: Diagnosis present

## 2018-04-03 DIAGNOSIS — O9902 Anemia complicating childbirth: Secondary | ICD-10-CM | POA: Diagnosis present

## 2018-04-03 DIAGNOSIS — O411230 Chorioamnionitis, third trimester, not applicable or unspecified: Secondary | ICD-10-CM | POA: Diagnosis present

## 2018-04-03 DIAGNOSIS — O9952 Diseases of the respiratory system complicating childbirth: Secondary | ICD-10-CM | POA: Diagnosis present

## 2018-04-03 DIAGNOSIS — O99210 Obesity complicating pregnancy, unspecified trimester: Secondary | ICD-10-CM

## 2018-04-03 DIAGNOSIS — Z3480 Encounter for supervision of other normal pregnancy, unspecified trimester: Secondary | ICD-10-CM

## 2018-04-03 DIAGNOSIS — D649 Anemia, unspecified: Secondary | ICD-10-CM | POA: Diagnosis present

## 2018-04-03 DIAGNOSIS — Z3009 Encounter for other general counseling and advice on contraception: Secondary | ICD-10-CM

## 2018-04-03 DIAGNOSIS — O09293 Supervision of pregnancy with other poor reproductive or obstetric history, third trimester: Secondary | ICD-10-CM

## 2018-04-03 DIAGNOSIS — Z3483 Encounter for supervision of other normal pregnancy, third trimester: Secondary | ICD-10-CM | POA: Diagnosis present

## 2018-04-03 DIAGNOSIS — Z6841 Body Mass Index (BMI) 40.0 and over, adult: Secondary | ICD-10-CM

## 2018-04-03 DIAGNOSIS — O99213 Obesity complicating pregnancy, third trimester: Secondary | ICD-10-CM

## 2018-04-03 DIAGNOSIS — F419 Anxiety disorder, unspecified: Secondary | ICD-10-CM | POA: Diagnosis present

## 2018-04-03 DIAGNOSIS — O99824 Streptococcus B carrier state complicating childbirth: Secondary | ICD-10-CM | POA: Diagnosis present

## 2018-04-03 LAB — CBC
HCT: 34 % — ABNORMAL LOW (ref 36.0–46.0)
HEMOGLOBIN: 10.8 g/dL — AB (ref 12.0–15.0)
MCH: 25.8 pg — AB (ref 26.0–34.0)
MCHC: 31.8 g/dL (ref 30.0–36.0)
MCV: 81.1 fL (ref 80.0–100.0)
Platelets: 244 10*3/uL (ref 150–400)
RBC: 4.19 MIL/uL (ref 3.87–5.11)
RDW: 14.6 % (ref 11.5–15.5)
WBC: 12.7 10*3/uL — ABNORMAL HIGH (ref 4.0–10.5)
nRBC: 0 % (ref 0.0–0.2)

## 2018-04-03 LAB — TYPE AND SCREEN
ABO/RH(D): A POS
Antibody Screen: NEGATIVE

## 2018-04-03 MED ORDER — LACTATED RINGERS IV SOLN
500.0000 mL | Freq: Once | INTRAVENOUS | Status: AC
  Administered 2018-04-03: 500 mL via INTRAVENOUS

## 2018-04-03 MED ORDER — FENTANYL 2.5 MCG/ML BUPIVACAINE 1/10 % EPIDURAL INFUSION (WH - ANES)
14.0000 mL/h | INTRAMUSCULAR | Status: DC | PRN
  Administered 2018-04-03 – 2018-04-04 (×3): 14 mL/h via EPIDURAL
  Filled 2018-04-03 (×2): qty 100

## 2018-04-03 MED ORDER — ACETAMINOPHEN 500 MG PO TABS
1000.0000 mg | ORAL_TABLET | Freq: Once | ORAL | Status: AC
  Administered 2018-04-03: 1000 mg via ORAL
  Filled 2018-04-03: qty 2

## 2018-04-03 MED ORDER — FLEET ENEMA 7-19 GM/118ML RE ENEM: 1.0000 | ENEMA | RECTAL | Status: DC | PRN

## 2018-04-03 MED ORDER — OXYTOCIN 40 UNITS IN LACTATED RINGERS INFUSION - SIMPLE MED
2.5000 [IU]/h | INTRAVENOUS | Status: DC
  Filled 2018-04-03: qty 1000

## 2018-04-03 MED ORDER — LACTATED RINGERS IV SOLN: 500.0000 mL | Freq: Once | INTRAVENOUS | Status: DC

## 2018-04-03 MED ORDER — PENICILLIN G 3 MILLION UNITS IVPB - SIMPLE MED
3.0000 10*6.[IU] | INTRAVENOUS | Status: DC
  Administered 2018-04-04 (×2): 3 10*6.[IU] via INTRAVENOUS
  Filled 2018-04-03 (×5): qty 100

## 2018-04-03 MED ORDER — LACTATED RINGERS IV SOLN
500.0000 mL | INTRAVENOUS | Status: DC | PRN
  Administered 2018-04-04: 500 mL via INTRAVENOUS

## 2018-04-03 MED ORDER — PHENYLEPHRINE 40 MCG/ML (10ML) SYRINGE FOR IV PUSH (FOR BLOOD PRESSURE SUPPORT)
80.0000 ug | PREFILLED_SYRINGE | INTRAVENOUS | Status: DC | PRN
  Filled 2018-04-03: qty 5

## 2018-04-03 MED ORDER — DIPHENHYDRAMINE HCL 50 MG/ML IJ SOLN: 12.5000 mg | INTRAMUSCULAR | Status: DC | PRN

## 2018-04-03 MED ORDER — LIDOCAINE HCL (PF) 1 % IJ SOLN
INTRAMUSCULAR | Status: DC | PRN
  Administered 2018-04-03: 5 mL via EPIDURAL

## 2018-04-03 MED ORDER — EPHEDRINE 5 MG/ML INJ
10.0000 mg | INTRAVENOUS | Status: DC | PRN
  Filled 2018-04-03: qty 4
  Filled 2018-04-03: qty 2

## 2018-04-03 MED ORDER — SOD CITRATE-CITRIC ACID 500-334 MG/5ML PO SOLN
30.0000 mL | ORAL | Status: DC | PRN
  Administered 2018-04-04: 30 mL via ORAL
  Filled 2018-04-03: qty 15

## 2018-04-03 MED ORDER — LACTATED RINGERS IV SOLN
INTRAVENOUS | Status: DC
  Administered 2018-04-03 – 2018-04-04 (×3): via INTRAVENOUS

## 2018-04-03 MED ORDER — EPHEDRINE 5 MG/ML INJ: 10.0000 mg | INTRAVENOUS | Status: DC | PRN

## 2018-04-03 MED ORDER — OXYTOCIN BOLUS FROM INFUSION
500.0000 mL | Freq: Once | INTRAVENOUS | Status: AC
  Administered 2018-04-04: 500 mL via INTRAVENOUS

## 2018-04-03 MED ORDER — PHENYLEPHRINE 40 MCG/ML (10ML) SYRINGE FOR IV PUSH (FOR BLOOD PRESSURE SUPPORT)
80.0000 ug | PREFILLED_SYRINGE | INTRAVENOUS | Status: DC | PRN
  Administered 2018-04-04 (×2): 80 ug via INTRAVENOUS
  Filled 2018-04-03: qty 5

## 2018-04-03 MED ORDER — ONDANSETRON HCL 4 MG/2ML IJ SOLN: 4.0000 mg | Freq: Four times a day (QID) | INTRAMUSCULAR | Status: DC | PRN

## 2018-04-03 MED ORDER — EPHEDRINE 5 MG/ML INJ
10.0000 mg | INTRAVENOUS | Status: DC | PRN
  Filled 2018-04-03: qty 2

## 2018-04-03 MED ORDER — PHENYLEPHRINE 40 MCG/ML (10ML) SYRINGE FOR IV PUSH (FOR BLOOD PRESSURE SUPPORT): 80.0000 ug | PREFILLED_SYRINGE | INTRAVENOUS | Status: DC | PRN

## 2018-04-03 MED ORDER — FENTANYL 2.5 MCG/ML BUPIVACAINE 1/10 % EPIDURAL INFUSION (WH - ANES)
INTRAMUSCULAR | Status: AC
  Filled 2018-04-03: qty 100

## 2018-04-03 MED ORDER — LIDOCAINE HCL (PF) 1 % IJ SOLN
30.0000 mL | INTRAMUSCULAR | Status: DC | PRN
  Filled 2018-04-03: qty 30

## 2018-04-03 MED ORDER — PHENYLEPHRINE 40 MCG/ML (10ML) SYRINGE FOR IV PUSH (FOR BLOOD PRESSURE SUPPORT)
PREFILLED_SYRINGE | INTRAVENOUS | Status: AC
  Filled 2018-04-03: qty 10

## 2018-04-03 MED ORDER — PROMETHAZINE HCL 25 MG/ML IJ SOLN
12.5000 mg | INTRAMUSCULAR | Status: DC | PRN
  Administered 2018-04-03: 12.5 mg via INTRAVENOUS
  Filled 2018-04-03: qty 1

## 2018-04-03 MED ORDER — FENTANYL CITRATE (PF) 100 MCG/2ML IJ SOLN
100.0000 ug | INTRAMUSCULAR | Status: DC | PRN
  Administered 2018-04-04: 50 ug via INTRAVENOUS
  Filled 2018-04-03: qty 2

## 2018-04-03 MED ORDER — SODIUM CHLORIDE 0.9 % IV SOLN
5.0000 10*6.[IU] | Freq: Once | INTRAVENOUS | Status: AC
  Administered 2018-04-03: 5 10*6.[IU] via INTRAVENOUS
  Filled 2018-04-03: qty 5

## 2018-04-03 NOTE — Progress Notes (Signed)
Patient ID: Laurie Reed, female   DOB: 1985-11-24, 32 y.o.   MRN: [id number] Requesting epidural  Vitals:   04/03/18 1852 04/03/18 2016  BP: 124/79   Pulse: (!) 112   Resp: 18   Temp: 98.5 F (36.9 C) 98.3 F (36.8 C)  TempSrc: Oral Oral  SpO2: 98%   Weight: 110.2 kg   Height: 5\' 2"  (1.575 m)    FHR stable UCs irregular but getting stronger  .Dilation: 4 Effacement (%): 60 Exam by:: Ralene MuskratLauren Cox RN

## 2018-04-03 NOTE — Anesthesia Procedure Notes (Signed)
Epidural Patient location during procedure: OB Start time: 04/03/2018 8:44 PM End time: 04/03/2018 9:00 PM  Staffing Anesthesiologist: Trevor IhaHouser,  A, MD Performed: anesthesiologist   Preanesthetic Checklist Completed: patient identified, site marked, surgical consent, pre-op evaluation, timeout performed, IV checked, risks and benefits discussed and monitors and equipment checked  Epidural Patient position: sitting Prep: site prepped and draped and DuraPrep Patient monitoring: continuous pulse ox and blood pressure Approach: midline Location: L3-L4 Injection technique: LOR air  Needle:  Needle type: Tuohy  Needle gauge: 17 G Needle length: 9 cm and 9 Needle insertion depth: 8 cm Catheter type: closed end flexible Catheter size: 19 Gauge Catheter at skin depth: 14 cm Test dose: negative  Assessment Events: blood not aspirated, injection not painful, no injection resistance, negative IV test and no paresthesia  Additional Notes 1 attempt. Pt tolerated procedure well.

## 2018-04-03 NOTE — MAU Note (Signed)
Pt presents with contractions, denies bleeding or ROM 

## 2018-04-03 NOTE — H&P (Addendum)
OBSTETRIC ADMISSION HISTORY AND PHYSICAL  Laurie Reed is a 32 y.o. female [phone] with IUP at [redacted]w[redacted]d by L/5 presenting for onset spontaneous labor.   Reports fetal movement. Denies vaginal bleeding. No rupture of membranes.  She received her prenatal care at [facility].  Support person in labor: none present  Prenatal History/Complications: . H/o GDM without insulin use - unable to complete GTT this pregnancy because of emesis  . Large for gestational age . Morbid obesity of mother  Ultrasounds  5w2: dating U/S   8w2: follow-up dating U/S   18w6: normal fetal anatomy U/S but poorly visualized fetal spine, cardiac views   23w2: normal f/u anatomy  31w6: EFW 1713g (42%), normal fetal growth   35w6: EFW 2692g (56%), normal interval growth   Past Medical History: Past Medical History:  Diagnosis Date  . Acid reflux 2004  . Anemia   . Anxiety 2008  . Asthma 1993   no previous intubations or hospitalizations   . Chlamydia   . Depression 2008   no meds, currenly ok  . Environmental allergies   . Migraines   . Miscarriage   . Ovarian cyst   . Pre-diabetes   . Ulcer of the stomach and intestine 2004  . Urinary tract infection     Past Surgical History: Past Surgical History:  Procedure Laterality Date  . COLONOSCOPY  2004   . IRRIGATION AND DEBRIDEMENT SEBACEOUS CYST N/A 06/01/2017   Procedure: EXCISION SEBACEOUS CYST ON SCALP;  Surgeon: Ancil Linsey, MD;  Location: [facility] ORS;  Service: General;  Laterality: N/A;  . MULTIPLE TOOTH EXTRACTIONS  2015   6 teeth removed   . WISDOM TOOTH EXTRACTION  2007    Obstetrical History: OB History    Gravida  7   Para  3   Term  3   Preterm  0   AB  3   Living  3     SAB  3   TAB  0   Ectopic  0   Multiple  0   Live Births  3          Social History: Social History   Socioeconomic History  . Marital status: Divorced    Spouse name: Not on file  . Number of children: 3  . Years of education: 51    . Highest education level: Not on file  Occupational History    Employer: [employer]  Social Needs  . Financial resource strain: Not hard at all  . Food insecurity:    Worry: Never true    Inability: Never true  . Transportation needs:    Medical: No    Non-medical: Not on file  Tobacco Use  . Smoking status: Never Smoker  . Smokeless tobacco: Never Used  Substance and Sexual Activity  . Alcohol use: No    Alcohol/week: 0.0 standard drinks  . Drug use: No  . Sexual activity: Not Currently    Birth control/protection: None  Lifestyle  . Physical activity:    Days per week: Not on file    Minutes per session: Not on file  . Stress: Only a little  Relationships  . Social connections:    Talks on phone: Not on file    Gets together: Not on file    Attends religious service: Not on file    Active member of club or organization: Not on file    Attends meetings of clubs or organizations: Not on file  Relationship status: Not on file  Other Topics Concern  . Not on file  Social History Narrative   Lives with 3 children.   Immediate family in [place].    Born in [place].   Raised in [place], [place].    Lives in a 2 story home.   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 Asthma Mother   . Heart disease Mother   . Hypertension Mother   . Asthma Maternal Aunt   . Hypertension Maternal Aunt   . Diabetes Maternal Aunt   . Asthma Maternal Uncle   . Hypertension Maternal Uncle   . Diabetes Maternal Uncle   . Heart disease Maternal Grandmother   . Diabetes Maternal Grandmother   . Hypertension Maternal Grandmother   . Heart disease Maternal Grandfather   . Diabetes Maternal Grandfather   . Hypertension Maternal Grandfather   . Hypertension Paternal Grandmother   . Hypotension Neg Hx   . Anesthesia problems Neg Hx   . Malignant hyperthermia Neg Hx   . Pseudochol deficiency Neg Hx    . Cancer Neg Hx     Allergies: Allergies  Allergen Reactions  . Other Anaphylaxis    "20 different types of trees"  . Latex Hives and Itching  . Zofran Itching    Medications Prior to Admission  Medication Sig Dispense Refill Last Dose  . acetaminophen (TYLENOL) 500 MG tablet Take 500 mg by mouth every 6 (six) hours as needed for moderate pain or headache.    04/03/2018 at Unknown time  . albuterol (PROVENTIL HFA;VENTOLIN HFA) 108 (90 Base) MCG/ACT inhaler Inhale into the lungs.   Past Month at Unknown time  . famotidine (PEPCID) 20 MG tablet Take 1 tablet (20 mg total) by mouth 2 (two) times daily. 60 tablet 3 04/02/2018 at Unknown time  . ferrous sulfate 325 (65 FE) MG tablet Take 1 tablet (325 mg total) by mouth 2 (two) times daily with a meal. 60 tablet 3 04/02/2018 at Unknown time  . Prenatal Multivit-Min-Fe-FA (PRENATAL VITAMINS) 0.8 MG tablet Take 1 tablet by mouth daily. 30 tablet 12 04/02/2018 at Unknown time  . aspirin 81 MG chewable tablet Chew 1 tablet (81 mg total) by mouth daily. (Patient not taking: Reported on 03/20/2018) 30 tablet 4 Not Taking  . butalbital-acetaminophen-caffeine (FIORICET, ESGIC) 50-325-40 MG tablet Take 1 tablet by mouth every 8 (eight) hours as needed for headache. 20 tablet 0 More than a month at Unknown time  . cetirizine (ZYRTEC) 10 MG tablet Take 1 tablet (10 mg total) by mouth daily. 30 tablet 0 Unknown at Unknown time  . cyclobenzaprine (FLEXERIL) 10 MG tablet Take 1 tablet (10 mg total) by mouth every 8 (eight) hours as needed (back pain). 30 tablet 0 Unknown at Unknown time  . metoCLOPramide (REGLAN) 5 MG/5ML solution Take 10 mLs (10 mg total) by mouth 4 (four) times daily -  before meals and at bedtime. 120 mL 0 Taking    Review of Systems  All systems reviewed and negative except as stated in HPI  Blood pressure 124/79, pulse (!) 112, temperature 98.5 F (36.9 C), temperature source Oral, resp. rate 18, height 5\' 2"  (1.575 m), weight  110.2 kg, last menstrual period 05/01/2017, SpO2 98 %, unknown if currently breastfeeding. General appearance: alert, cooperative, appears stated age, moderate distress and morbidly obese Lungs: no respiratory distress Heart: regular rate  Abdomen:  soft, non-tender; gravid b Pelvic: pustular lesions approximately 1cm diameter on left superior labia Extremities: Homans sign is negative, no sign of DVT Presentation: cephalic Fetal monitoring: 130s, moderate variability, accels, no decels, category 1 tracing Uterine activity: contractions regular Dilation: 4 Effacement (%): 60 Exam by:: Ralene MuskratLauren Cox RN   Prenatal labs: ABO, Rh: A/Positive/-- (05/01 1028) Antibody: Negative (05/01 1028) Rubella: 1.28 (05/01 1028) RPR: Non Reactive (09/06 0852)  HBsAg: Negative (05/01 1028)  HIV: Non Reactive (09/06 0852)  GBS: Positive (11/04 1321)  Glucola: 75 fasting- unable to tolerate 2hr Genetic screening: BIPS: low risk, female AFP: neg  Prenatal Transfer Tool  Maternal Diabetes: No Genetic Screening: Normal Maternal Ultrasounds/Referrals: abnormal- larger than gestational age Fetal Ultrasounds or other Referrals:  None Maternal Substance Abuse:  No Significant Maternal Medications:  None Significant Maternal Lab Results: GBS positive  No results found for this or any previous visit (from the past 24 hour(s)).  Patient Active Problem List   Diagnosis Date Noted  . Unwanted fertility 03/27/2018  . Group B Streptococcus carrier, +RV culture, currently pregnant 03/22/2018  . Anemia in pregnancy 01/23/2018  . Morbid obesity with BMI of 45.0-49.9, adult (HCC) 10/19/2017  . Maternal morbid obesity, antepartum (HCC) 10/19/2017  . Supervision of other normal pregnancy, antepartum 09/14/2017  . H/O gestational diabetes in prior pregnancy, currently pregnant 09/14/2017  . Seasonal allergic rhinitis 11/01/2016  . Asthma 06/30/2015  . Vitamin D deficiency 01/22/2015  . Anxiety and depression  01/21/2015    Assessment/Plan:  Reece Packeramika Montgomery is a 32 y.o. [id number] at 2971w6d here for spontaneous onset of labor with contractions. No rupture of membranes. Fetal movement felt.  Labor: spontaneous onset of labor prior to planned IOL -- pain control: tylenol, open to epidural  -- anticipate spontaneous vaginal delivery  Fetal Wellbeing: EFW 2682g at 56%. Cephalic by US confirmed on presentation.  -- GBS (positive)- administer penicillin.  -- continuous fetal monitoring - category I on admission  Postpartum Planning -- BTL (no consent on file) -- feeding- unsure  Jamelle Rushinghelsey Anderson, DO PGY-1 FM resident  Attestation: I have seen this patient and agree with the resident's documentation. I have examined them separately, and we have discussed the plan of care. I have updated the H&P as necessary.   Cristal DeerLaurel S. Earlene PlaterWallace, DO OB/GYN Fellow

## 2018-04-03 NOTE — Anesthesia Preprocedure Evaluation (Signed)
Anesthesia Evaluation  Patient identified by MRN, date of birth, ID band Patient awake    Reviewed: Allergy & Precautions, NPO status , Patient's Chart, lab work & pertinent test results  Airway Mallampati: III  TM Distance: >3 FB Neck ROM: Full    Dental no notable dental hx. (+) Teeth Intact, Dental Advisory Given   Pulmonary asthma ,    Pulmonary exam normal breath sounds clear to auscultation       Cardiovascular negative cardio ROS Normal cardiovascular exam Rhythm:Regular Rate:Normal     Neuro/Psych  Headaches, negative psych ROS   GI/Hepatic   Endo/Other  Morbid obesityHx Gestational DM no Insulin  Renal/GU      Musculoskeletal negative musculoskeletal ROS (+)   Abdominal (+) + obese,   Peds  Hematology  (+) anemia ,   Anesthesia Other Findings All Latex   Reproductive/Obstetrics (+) Pregnancy                             Lab Results  Component Value Date   CREATININE 0.62 03/18/2018   BUN 7 03/18/2018   NA 137 03/18/2018   K 3.5 03/18/2018   CL 106 03/18/2018   CO2 23 03/18/2018    Lab Results  Component Value Date   WBC 12.7 (H) 04/03/2018   HGB 10.8 (L) 04/03/2018   HCT 34.0 (L) 04/03/2018   MCV 81.1 04/03/2018   PLT 244 04/03/2018    Anesthesia Physical Anesthesia Plan  ASA: III  Anesthesia Plan: Epidural   Post-op Pain Management:    Induction:   PONV Risk Score and Plan:   Airway Management Planned:   Additional Equipment:   Intra-op Plan:   Post-operative Plan: Extubation in OR  Informed Consent: I have reviewed the patients History and Physical, chart, labs and discussed the procedure including the risks, benefits and alternatives for the proposed anesthesia with the patient or authorized representative who has indicated his/her understanding and acceptance.     Plan Discussed with:   Anesthesia Plan Comments:         Anesthesia  Quick Evaluation

## 2018-04-03 NOTE — Patient Instructions (Signed)
Labor Induction Labor induction is when steps are taken to cause a pregnant woman to begin the labor process. Most women go into labor on their own between 37 weeks and 42 weeks of the pregnancy. When this does not happen or when there is a medical need, methods may be used to induce labor. Labor induction causes a pregnant woman's uterus to contract. It also causes the cervix to soften (ripen), open (dilate), and thin out (efface). Usually, labor is not induced before 39 weeks of the pregnancy unless there is a problem with the baby or mother. Before inducing labor, your health care provider will consider a number of factors, including the following:  The medical condition of you and the baby.  How many weeks along you are.  The status of the baby's lung maturity.  The condition of the cervix.  The position of the baby. What are the reasons for labor induction? Labor may be induced for the following reasons:  The health of the baby or mother is at risk.  The pregnancy is overdue by 1 week or more.  The water breaks but labor does not start on its own.  The mother has a health condition or serious illness, such as high blood pressure, infection, placental abruption, or diabetes.  The amniotic fluid amounts are low around the baby.  The baby is distressed. Convenience or wanting the baby to be born on a certain date is not a reason for inducing labor. What methods are used for labor induction? Several methods of labor induction may be used, such as:  Prostaglandin medicine. This medicine causes the cervix to dilate and ripen. The medicine will also start contractions. It can be taken by mouth or by inserting a suppository into the vagina.  Inserting a thin tube (catheter) with a balloon on the end into the vagina to dilate the cervix. Once inserted, the balloon is expanded with water, which causes the cervix to open.  Stripping the membranes. Your health care provider separates  amniotic sac tissue from the cervix, causing the cervix to be stretched and causing the release of a hormone called progesterone. This may cause the uterus to contract. It is often done during an office visit. You will be sent home to wait for the contractions to begin. You will then come in for an induction.  Breaking the water. Your health care provider makes a hole in the amniotic sac using a small instrument. Once the amniotic sac breaks, contractions should begin. This may still take hours to see an effect.  Medicine to trigger or strengthen contractions. This medicine is given through an IV access tube inserted into a vein in your arm. All of the methods of induction, besides stripping the membranes, will be done in the hospital. Induction is done in the hospital so that you and the baby can be carefully monitored. How long does it take for labor to be induced? Some inductions can take up to 2-3 days. Depending on the cervix, it usually takes less time. It takes longer when you are induced early in the pregnancy or if this is your first pregnancy. If a mother is still pregnant and the induction has been going on for 2-3 days, either the mother will be sent home or a cesarean delivery will be needed. What are the risks associated with labor induction? Some of the risks of induction include:  Changes in fetal heart rate, such as too high, too low, or erratic.  Fetal distress.    Chance of infection for the mother and baby.  Increased chance of having a cesarean delivery.  Breaking off (abruption) of the placenta from the uterus (rare).  Uterine rupture (very rare). When induction is needed for medical reasons, the benefits of induction may outweigh the risks. What are some reasons for not inducing labor? Labor induction should not be done if:  It is shown that your baby does not tolerate labor.  You have had previous surgeries on your uterus, such as a myomectomy or the removal of  fibroids.  Your placenta lies very low in the uterus and blocks the opening of the cervix (placenta previa).  Your baby is not in a head-down position.  The umbilical cord drops down into the birth canal in front of the baby. This could cut off the baby's blood and oxygen supply.  You have had a previous cesarean delivery.  There are unusual circumstances, such as the baby being extremely premature. This information is not intended to replace advice given to you by your health care provider. Make sure you discuss any questions you have with your health care provider. Document Released: 09/22/2006 Document Revised: 10/09/2015 Document Reviewed: 11/30/2012 Elsevier Interactive Patient Education  2017 Elsevier Inc.  

## 2018-04-03 NOTE — Anesthesia Pain Management Evaluation Note (Signed)
  CRNA Pain Management Visit Note  Patient: Laurie Reed, 32 y.o., female  "Hello [facility] am a [facility]. We have an anesthesia team available at all times to provide care throughout the hospital, including epidural management and anesthesia for C-section. I don't know your plan for the delivery whether it a natural birth, water birth, IV sedation, nitrous supplementation, doula or epidural, but we want to meet your pain goals."   1.Was your pain managed to your expectations on prior hospitalizations?   Yes   2.What is your expectation for pain management during this hospitalization?     Epidural  3.How can we help you reach that goal? Epidural in place and is working now.  Record the patient's initial score and the patient's pain goal.   Pain: 0  Pain Goal: 4 [facility] wants you to be able to say your pain was always managed very well.  Mikalyn Hermida 04/03/2018

## 2018-04-03 NOTE — Progress Notes (Signed)
C/o of a lot of mucous discharge since Friday with smell.

## 2018-04-03 NOTE — MAU Note (Signed)
Karsten Fellshelsea Anderson MD performed a bedside US to confirm Vertex presentation. She also assessed a lesion on pts labia and confirmed it is a ingrown hair not any type of STD. Pt is okay to have a vaginal delivery.

## 2018-04-03 NOTE — Progress Notes (Signed)
   PRENATAL VISIT NOTE  Subjective:  Laurie Reed is a 32 y.o. [phone] at 5954w6d being seen today for ongoing prenatal care.  She is currently monitored for the following issues for this high-risk pregnancy and has Anxiety and depression; Vitamin D deficiency; Asthma; Seasonal allergic rhinitis; Supervision of other normal pregnancy, antepartum; H/O gestational diabetes in prior pregnancy, currently pregnant; Morbid obesity with BMI of 45.0-49.9, adult (HCC); Maternal morbid obesity, antepartum (HCC); Anemia in pregnancy; Group B Streptococcus carrier, +RV culture, currently pregnant; and Unwanted fertility on their problem list.  Patient reports vulvar hair bump for 3-4 days. Vaginal mucus d/c.  Contractions: Irregular. Vag. Bleeding: None.  Movement: Present. Denies leaking of fluid.   The following portions of the patient's history were reviewed and updated as appropriate: allergies, current medications, past family history, past medical history, past social history, past surgical history and problem list. Problem list updated.  Objective:   Vitals:   04/03/18 0929  BP: 111/69  Pulse: (!) 103  Weight: 241 lb (109.3 kg)    Fetal Status: Fetal Heart Rate (bpm): 136   Movement: Present     General:  Alert, oriented and cooperative. Patient is in no acute distress.  Skin: Skin is warm and dry. No rash noted.   Cardiovascular: Normal heart rate noted  Respiratory: Normal respiratory effort, no problems with respiration noted  Abdomen: Soft, gravid, appropriate for gestational age.  Pain/Pressure: Present     Pelvic: Cervical exam performed      1 cm follicle abscess left labium minor, no ulceration not herpetiform  Extremities: Normal range of motion.  Edema: None  Mental Status: Normal mood and affect. Normal behavior. Normal judgment and thought content.   Assessment and Plan:  Pregnancy: [id number] at 5254w6d  1. Maternal morbid obesity, antepartum (HCC)   2. H/O gestational  diabetes in prior pregnancy, currently pregnant   3. Supervision of other normal pregnancy, antepartum Elective IOL in AM  Term labor symptoms and general obstetric precautions including but not limited to vaginal bleeding, contractions, leaking of fluid and fetal movement were reviewed in detail with the patient. Please refer to After Visit Summary for other counseling recommendations.  Return in about 4 weeks (around 05/01/2018) for postpartum.  Future Appointments  Date Time Provider Department Center  04/04/2018 12:00 AM [facility] RO[facility] None  04/10/2018  9:15 AM Adam Phenix,  G, [facility]    Scheryl Darter , MD

## 2018-04-04 ENCOUNTER — Inpatient Hospital Stay (HOSPITAL_COMMUNITY): Payer: Medicaid Other | Admitting: Anesthesiology

## 2018-04-04 ENCOUNTER — Inpatient Hospital Stay (HOSPITAL_COMMUNITY): Admission: RE | Admit: 2018-04-04 | Payer: Medicaid Other | Source: Ambulatory Visit

## 2018-04-04 ENCOUNTER — Encounter (HOSPITAL_COMMUNITY)
Admission: AD | Disposition: A | Discharge status: Home / Self Care | Payer: Self-pay | Source: Home / Self Care | Attending: Obstetrics and Gynecology

## 2018-04-04 ENCOUNTER — Encounter (HOSPITAL_COMMUNITY): Payer: Self-pay

## 2018-04-04 DIAGNOSIS — Z3A38 38 weeks gestation of pregnancy: Secondary | ICD-10-CM

## 2018-04-04 DIAGNOSIS — O99824 Streptococcus B carrier state complicating childbirth: Secondary | ICD-10-CM

## 2018-04-04 HISTORY — PX: TUBAL LIGATION: SHX77

## 2018-04-04 LAB — RPR: RPR: NONREACTIVE

## 2018-04-04 SURGERY — LIGATION, FALLOPIAN TUBE, POSTPARTUM
Anesthesia: Epidural | Site: Abdomen | Wound class: Clean Contaminated

## 2018-04-04 MED ORDER — OXYTOCIN 40 UNITS IN LACTATED RINGERS INFUSION - SIMPLE MED
1.0000 m[IU]/min | INTRAVENOUS | Status: DC
  Administered 2018-04-04 (×2): 2 m[IU]/min via INTRAVENOUS
  Filled 2018-04-04: qty 1000

## 2018-04-04 MED ORDER — BENZOCAINE-MENTHOL 20-0.5 % EX AERO: 1.0000 "application " | INHALATION_SPRAY | CUTANEOUS | Status: DC | PRN

## 2018-04-04 MED ORDER — LIDOCAINE-EPINEPHRINE (PF) 2 %-1:200000 IJ SOLN
INTRAMUSCULAR | Status: AC
  Filled 2018-04-04: qty 20

## 2018-04-04 MED ORDER — WITCH HAZEL-GLYCERIN EX PADS: 1.0000 "application " | MEDICATED_PAD | CUTANEOUS | Status: DC | PRN

## 2018-04-04 MED ORDER — DIPHENHYDRAMINE HCL 50 MG/ML IJ SOLN
INTRAMUSCULAR | Status: AC
  Filled 2018-04-04: qty 1

## 2018-04-04 MED ORDER — TERBUTALINE SULFATE 1 MG/ML IJ SOLN
0.2500 mg | Freq: Once | INTRAMUSCULAR | Status: DC | PRN
  Filled 2018-04-04: qty 1

## 2018-04-04 MED ORDER — PROPOFOL 10 MG/ML IV BOLUS
INTRAVENOUS | Status: AC
  Filled 2018-04-04: qty 20

## 2018-04-04 MED ORDER — OXYCODONE HCL 5 MG PO TABS
5.0000 mg | ORAL_TABLET | ORAL | Status: DC | PRN
  Administered 2018-04-04 – 2018-04-06 (×8): 5 mg via ORAL
  Filled 2018-04-04 (×8): qty 1

## 2018-04-04 MED ORDER — ZOLPIDEM TARTRATE 5 MG PO TABS: 5.0000 mg | ORAL_TABLET | Freq: Every evening | ORAL | Status: DC | PRN

## 2018-04-04 MED ORDER — LIDOCAINE HCL (CARDIAC) PF 100 MG/5ML IV SOSY
PREFILLED_SYRINGE | INTRAVENOUS | Status: AC
  Filled 2018-04-04: qty 5

## 2018-04-04 MED ORDER — ONDANSETRON HCL 4 MG/2ML IJ SOLN
INTRAMUSCULAR | Status: AC
  Filled 2018-04-04: qty 2

## 2018-04-04 MED ORDER — SIMETHICONE 80 MG PO CHEW
80.0000 mg | CHEWABLE_TABLET | ORAL | Status: DC | PRN
  Administered 2018-04-06: 80 mg via ORAL
  Filled 2018-04-04: qty 1

## 2018-04-04 MED ORDER — SODIUM CHLORIDE 0.9 % IV SOLN
1.0000 g | Freq: Four times a day (QID) | INTRAVENOUS | Status: DC
  Administered 2018-04-04: 1 g via INTRAVENOUS
  Filled 2018-04-04: qty 1000

## 2018-04-04 MED ORDER — SENNOSIDES-DOCUSATE SODIUM 8.6-50 MG PO TABS
2.0000 | ORAL_TABLET | ORAL | Status: DC
  Administered 2018-04-04 – 2018-04-05 (×2): 2 via ORAL
  Filled 2018-04-04 (×2): qty 2

## 2018-04-04 MED ORDER — SODIUM BICARBONATE 8.4 % IV SOLN
INTRAVENOUS | Status: AC
  Filled 2018-04-04: qty 50

## 2018-04-04 MED ORDER — ACETAMINOPHEN 325 MG PO TABS: 325.0000 mg | ORAL_TABLET | ORAL | Status: DC | PRN

## 2018-04-04 MED ORDER — ONDANSETRON HCL 4 MG/2ML IJ SOLN
INTRAMUSCULAR | Status: DC | PRN
  Administered 2018-04-04: 4 mg via INTRAVENOUS

## 2018-04-04 MED ORDER — BUPIVACAINE HCL (PF) 0.25 % IJ SOLN
INTRAMUSCULAR | Status: DC | PRN
  Administered 2018-04-04: 8 mL via EPIDURAL

## 2018-04-04 MED ORDER — COCONUT OIL OIL: 1.0000 "application " | TOPICAL_OIL | Status: DC | PRN

## 2018-04-04 MED ORDER — BUPIVACAINE HCL (PF) 0.25 % IJ SOLN
INTRAMUSCULAR | Status: DC | PRN
  Administered 2018-04-04: 30 mL

## 2018-04-04 MED ORDER — DIBUCAINE 1 % RE OINT: 1.0000 "application " | TOPICAL_OINTMENT | RECTAL | Status: DC | PRN

## 2018-04-04 MED ORDER — SODIUM BICARBONATE 8.4 % IV SOLN
INTRAVENOUS | Status: DC | PRN
  Administered 2018-04-04 (×4): 5 mL via EPIDURAL

## 2018-04-04 MED ORDER — GENTAMICIN SULFATE 40 MG/ML IJ SOLN
5.0000 mg/kg | Freq: Once | INTRAVENOUS | Status: AC
  Administered 2018-04-04: 370 mg via INTRAVENOUS
  Filled 2018-04-04: qty 9.25

## 2018-04-04 MED ORDER — MIDAZOLAM HCL 2 MG/2ML IJ SOLN
INTRAMUSCULAR | Status: AC
  Filled 2018-04-04: qty 2

## 2018-04-04 MED ORDER — FENTANYL CITRATE (PF) 100 MCG/2ML IJ SOLN
INTRAMUSCULAR | Status: AC
  Filled 2018-04-04: qty 2

## 2018-04-04 MED ORDER — IBUPROFEN 600 MG PO TABS
600.0000 mg | ORAL_TABLET | Freq: Four times a day (QID) | ORAL | Status: DC
  Administered 2018-04-04 – 2018-04-06 (×7): 600 mg via ORAL
  Filled 2018-04-04 (×8): qty 1

## 2018-04-04 MED ORDER — OXYCODONE HCL 5 MG/5ML PO SOLN: 5.0000 mg | Freq: Once | ORAL | Status: DC | PRN

## 2018-04-04 MED ORDER — BUTALBITAL-APAP-CAFFEINE 50-325-40 MG PO TABS
1.0000 | ORAL_TABLET | Freq: Once | ORAL | Status: AC
  Administered 2018-04-04: 1 via ORAL
  Filled 2018-04-04: qty 1

## 2018-04-04 MED ORDER — MIDAZOLAM HCL 2 MG/2ML IJ SOLN
INTRAMUSCULAR | Status: DC | PRN
  Administered 2018-04-04: 1 mg via INTRAVENOUS

## 2018-04-04 MED ORDER — ACETAMINOPHEN 500 MG PO TABS
1000.0000 mg | ORAL_TABLET | Freq: Once | ORAL | Status: AC
  Administered 2018-04-04: 1000 mg via ORAL
  Filled 2018-04-04: qty 2

## 2018-04-04 MED ORDER — BUPIVACAINE HCL (PF) 0.25 % IJ SOLN
INTRAMUSCULAR | Status: AC
  Filled 2018-04-04: qty 30

## 2018-04-04 MED ORDER — ACETAMINOPHEN 325 MG PO TABS
650.0000 mg | ORAL_TABLET | ORAL | Status: DC | PRN
  Administered 2018-04-05 – 2018-04-06 (×3): 650 mg via ORAL
  Filled 2018-04-04 (×3): qty 2

## 2018-04-04 MED ORDER — ACETAMINOPHEN 650 MG RE SUPP
650.0000 mg | Freq: Once | RECTAL | Status: AC
  Administered 2018-04-04: 650 mg via RECTAL
  Filled 2018-04-04 (×2): qty 1

## 2018-04-04 MED ORDER — OXYCODONE HCL 5 MG PO TABS: 5.0000 mg | ORAL_TABLET | Freq: Once | ORAL | Status: DC | PRN

## 2018-04-04 MED ORDER — PRENATAL MULTIVITAMIN CH
1.0000 | ORAL_TABLET | Freq: Every day | ORAL | Status: DC
  Administered 2018-04-05: 1 via ORAL
  Filled 2018-04-04 (×2): qty 1

## 2018-04-04 MED ORDER — DIPHENHYDRAMINE HCL 25 MG PO CAPS: 25.0000 mg | ORAL_CAPSULE | Freq: Four times a day (QID) | ORAL | Status: DC | PRN

## 2018-04-04 MED ORDER — FENTANYL CITRATE (PF) 100 MCG/2ML IJ SOLN
25.0000 ug | INTRAMUSCULAR | Status: DC | PRN
  Administered 2018-04-04 (×2): 50 ug via INTRAVENOUS

## 2018-04-04 MED ORDER — ACETAMINOPHEN 160 MG/5ML PO SOLN: 325.0000 mg | ORAL | Status: DC | PRN

## 2018-04-04 MED ORDER — TETANUS-DIPHTH-ACELL PERTUSSIS 5-2.5-18.5 LF-MCG/0.5 IM SUSP: 0.5000 mL | Freq: Once | INTRAMUSCULAR | Status: DC

## 2018-04-04 MED ORDER — FENTANYL CITRATE (PF) 100 MCG/2ML IJ SOLN
INTRAMUSCULAR | Status: DC | PRN
  Administered 2018-04-04: 100 ug via EPIDURAL

## 2018-04-04 MED ORDER — DIPHENHYDRAMINE HCL 50 MG/ML IJ SOLN
12.5000 mg | Freq: Once | INTRAMUSCULAR | Status: AC
  Administered 2018-04-04: 12.5 mg via INTRAVENOUS

## 2018-04-04 MED ORDER — MEPERIDINE HCL 25 MG/ML IJ SOLN: 6.2500 mg | INTRAMUSCULAR | Status: DC | PRN

## 2018-04-04 MED ORDER — ONDANSETRON HCL 4 MG/2ML IJ SOLN: 4.0000 mg | Freq: Once | INTRAMUSCULAR | Status: DC | PRN

## 2018-04-04 SURGICAL SUPPLY — 24 items
BLADE SURG 11 STRL SS (BLADE) ×3 IMPLANT
CLIP FILSHIE TUBAL LIGA STRL (Clip) ×3 IMPLANT
CLOTH BEACON ORANGE TIMEOUT ST (SAFETY) ×3 IMPLANT
DRSG OPSITE POSTOP 3X4 (GAUZE/BANDAGES/DRESSINGS) ×3 IMPLANT
DURAPREP 26ML APPLICATOR (WOUND CARE) ×3 IMPLANT
GLOVE BIOGEL PI IND STRL 7.0 (GLOVE) ×1 IMPLANT
GLOVE BIOGEL PI IND STRL 7.5 (GLOVE) ×1 IMPLANT
GLOVE BIOGEL PI INDICATOR 7.0 (GLOVE) ×2
GLOVE BIOGEL PI INDICATOR 7.5 (GLOVE) ×2
GLOVE ECLIPSE 7.5 STRL STRAW (GLOVE) ×3 IMPLANT
GOWN STRL REUS W/TWL LRG LVL3 (GOWN DISPOSABLE) ×6 IMPLANT
NEEDLE HYPO 22GX1.5 SAFETY (NEEDLE) ×3 IMPLANT
NS IRRIG 1000ML POUR BTL (IV SOLUTION) ×3 IMPLANT
PACK ABDOMINAL MINOR (CUSTOM PROCEDURE TRAY) ×3 IMPLANT
PROTECTOR NERVE ULNAR (MISCELLANEOUS) ×3 IMPLANT
SPONGE LAP 4X18 RFD (DISPOSABLE) IMPLANT
SUT PLAIN 2 0 (SUTURE) ×3
SUT PLAIN ABS 2-0 54XMFL TIE (SUTURE) ×1 IMPLANT
SUT VICRYL 0 UR6 27IN ABS (SUTURE) ×3 IMPLANT
SUT VICRYL 4-0 PS2 18IN ABS (SUTURE) ×3 IMPLANT
SYR CONTROL 10ML LL (SYRINGE) ×3 IMPLANT
TOWEL OR 17X24 6PK STRL BLUE (TOWEL DISPOSABLE) ×6 IMPLANT
TRAY FOLEY CATH SILVER 14FR (SET/KITS/TRAYS/PACK) ×3 IMPLANT
WATER STERILE IRR 1000ML POUR (IV SOLUTION) ×3 IMPLANT

## 2018-04-04 NOTE — Anesthesia Preprocedure Evaluation (Signed)
Anesthesia Evaluation  Patient identified by MRN, date of birth, ID band Patient awake    Reviewed: Allergy & Precautions, NPO status , Patient's Chart, lab work & pertinent test results  Airway Mallampati: III  TM Distance: >3 FB Neck ROM: Full    Dental no notable dental hx. (+) Teeth Intact, Dental Advisory Given   Pulmonary asthma ,    Pulmonary exam normal breath sounds clear to auscultation       Cardiovascular negative cardio ROS Normal cardiovascular exam Rhythm:Regular Rate:Normal     Neuro/Psych  Headaches, negative psych ROS   GI/Hepatic   Endo/Other  Morbid obesityHx Gestational DM no Insulin  Renal/GU      Musculoskeletal negative musculoskeletal ROS (+)   Abdominal (+) + obese,   Peds  Hematology  (+) Blood dyscrasia, anemia ,   Anesthesia Other Findings All Latex   Reproductive/Obstetrics (+) Pregnancy                             Lab Results  Component Value Date   CREATININE 0.62 03/18/2018   BUN 7 03/18/2018   NA 137 03/18/2018   K 3.5 03/18/2018   CL 106 03/18/2018   CO2 23 03/18/2018    Lab Results  Component Value Date   WBC 12.7 (H) 04/03/2018   HGB 10.8 (L) 04/03/2018   HCT 34.0 (L) 04/03/2018   MCV 81.1 04/03/2018   PLT 244 04/03/2018    Anesthesia Physical  Anesthesia Plan  ASA: III  Anesthesia Plan: Epidural   Post-op Pain Management:    Induction:   PONV Risk Score and Plan:   Airway Management Planned:   Additional Equipment:   Intra-op Plan:   Post-operative Plan:   Informed Consent: I have reviewed the patients History and Physical, chart, labs and discussed the procedure including the risks, benefits and alternatives for the proposed anesthesia with the patient or authorized representative who has indicated his/her understanding and acceptance.     Plan Discussed with: CRNA, Anesthesiologist and Surgeon  Anesthesia Plan  Comments:         Anesthesia Quick Evaluation

## 2018-04-04 NOTE — Transfer of Care (Signed)
Immediate Anesthesia Transfer of Care Note  Patient: Laurie Reed  Procedure(s) Performed: POST PARTUM TUBAL LIGATION (N/A Abdomen)  Patient Location: PACU  Anesthesia Type:Epidural  Level of Consciousness: sedated  Airway & Oxygen Therapy: Patient Spontanous Breathing and Patient connected to nasal cannula oxygen  Post-op Assessment: Report given to RN  Post vital signs: Reviewed and stable  Last Vitals:  Vitals Value Taken Time  BP 100/45 04/04/2018  2:17 PM  Temp    Pulse 97 04/04/2018  2:18 PM  Resp 13 04/04/2018  2:18 PM  SpO2 96 % 04/04/2018  2:18 PM  Vitals shown include unvalidated device data.  Last Pain:  Vitals:   04/04/18 1245  TempSrc:   PainSc: 0-No pain         Complications: No apparent anesthesia complications

## 2018-04-04 NOTE — Anesthesia Postprocedure Evaluation (Signed)
Anesthesia Post Note  Patient: Laurie Reed  Procedure(s) Performed: POST PARTUM TUBAL LIGATION (N/A Abdomen)     Patient location during evaluation: Mother Baby Anesthesia Type: Epidural Level of consciousness: awake and alert Pain management: pain level controlled Vital Signs Assessment: post-procedure vital signs reviewed and stable Respiratory status: spontaneous breathing, nonlabored ventilation and respiratory function stable Cardiovascular status: stable Postop Assessment: no headache, no backache and epidural receding Anesthetic complications: no    Last Vitals:  Vitals:   04/04/18 1600 04/04/18 1615  BP: 108/71 117/77  Pulse: 95 94  Resp: 17 14  Temp: 37.2 C   SpO2: 98% 99%    Last Pain:  Vitals:   04/04/18 1615  TempSrc:   PainSc: 0-No pain   Pain Goal:                 Sanam Marmo

## 2018-04-04 NOTE — Progress Notes (Signed)
OB/GYN Faculty Practice: Labor Progress Note  Subjective: Plan of care discussed with RN. Fetal tachycardia, occasional deceleration and maternal tachycardia, elevated temp. Giving bolus trying to use peanut ball to help with OP position on the other side.   Objective: BP (!) 101/51   Pulse (!) 115   Temp (!) 100.5 F (38.1 C) (Axillary)   Resp 20   Ht 5\' 2"  (1.575 m)   Wt 110.2 kg   LMP 05/01/2017 (Exact Date) Comment: Pt had a miscarriage 06/26/17  SpO2 100%   BMI 44.45 kg/m  Gen: strip note  Dilation: 10 Dilation Complete Date: 04/04/18 Dilation Complete Time: 0537 Effacement (%): 100 Station: Plus 1 Presentation: Vertex Exam by:: Shaw,CNM   Assessment and Plan: 32 y.o. [id number] 6763w0d here for spontaneous onset of labor.   Labor: Stage II, started pushing at 0550. Minimal descent with pushing for about 45 minutes likely because of both maternal effort and OP position. Took break for about an hour to try to turn baby using peanut ball. Now signs of Triple I so starting antibiotics with amp/gent and will give fluid bolus and tylenol.  -- AROM 0330 clear fluid -- pain control: epidural in place -- PPH Risk: moderate given BMI  Fetal Well-Being: EFW 2692g (56%) at 35w6  Leopold's 8-9lbs . Cephalic by sutures. -- Category II - continuous fetal monitoring - fetal tachycardia with occasional early decelerations, variables - giving bolus and position changes  -- GBS positive - PCN   Makinze Jani S. Earlene PlaterWallace, DO OB/GYN Fellow, Faculty Practice  9:09 AM

## 2018-04-04 NOTE — Progress Notes (Signed)
Patient ID: Laurie Reed, female   DOB: [birth year], 32 y.o.   MRN: [id number]  Pt has been complete since 0545; pushed x 45mins total but has been on peanut ball recently; CNM in room from [phone] [id number] assisting with side-lying release, both sides, x 3ctx each. Pt's epidural has been rebolused and she is now very comfortable; T max 100.5 at 0849- receiving Amp/Gent; also got Tylenol supp PR due to vomiting the oral dose  BP now 97/51- has rec'Reed phenylephrine due to hypotension EFM 150s, early variables Ctx q 2 mins with Pit @ 214mu/min My initial exam at 0845 was vtx +1, above symphysis Called back to the room at 1000 for difficulties tracing FHR- IFSE inserted and now vtx closer to +2  IUP@term  Protracted active phase, most likely due to fetal positioning Unk GDM status but moderately grown baby (EFW 56%) Now with Triple I  Continue to labor down in various positions; hopeful baby will rotate and deliver Watch FHR  Laurie Reed  [facility] 04/04/2018 10:28 AM

## 2018-04-04 NOTE — Progress Notes (Signed)
Patient ID: Laurie Reed, female   DOB: 06/27/1985, 32 y.o.   MRN: [id number]  Risks of procedure discussed with patient including but not limited to: risk of regret, permanence of method, bleeding, infection, injury to surrounding organs and need for additional procedures.  Failure risk of 1 -2 % with increased risk of ectopic gestation if pregnancy occurs was also discussed with patient.    Levie HeritageStinson, Jacob J, DO 04/04/2018 12:29 PM

## 2018-04-04 NOTE — Progress Notes (Signed)
OB/GYN Faculty Practice: Labor Progress Note  Subjective: Feeling well, no complaints. Still discomfort with contractions. Father of baby now here as support.  Objective: BP 110/70   Pulse 99   Temp 98.8 F (37.1 C) (Oral)   Resp 18   Ht 5\' 2"  (1.575 m)   Wt 110.2 kg   LMP 05/01/2017 (Exact Date) Comment: Pt had a miscarriage 06/26/17  SpO2 98%   BMI 44.45 kg/m  Gen: tired-appearing, uncomfortable with contractions Dilation: 8 Effacement (%): 70 Station: -3 Presentation: Vertex Exam by:: Dr. Earlene PlaterWallace  Assessment and Plan: 32 y.o. [id number] 9271w0d here for spontaneous onset of labor.   Labor: Expectant management, contractions every 1-3 minutes.  -- AROM 0330 clear fluid -- consider pitocin if no change at next check  -- pain control: epidural in place -- PPH Risk: moderate given BMI  Fetal Well-Being: EFW 2692g (56%) at 35w6  Leopold's 8-9lbs . Cephalic by prior checks, BSUS on admission.  -- Category I - continuous fetal monitoring  -- GBS positive - PCN - next dose due around 0200    Merrianne Mccumbers S. Earlene PlaterWallace, DO OB/GYN Fellow, Faculty Practice  3:28 AM

## 2018-04-04 NOTE — Progress Notes (Signed)
OB/GYN Faculty Practice: Labor Progress Note  Subjective: Doing well, comfortable with epidural in place. Family in room and wondering about why "taking so long." Patient reports headache, Tylenol has not improved pain. States history of migraines, usually takes Topamax outside of pregnancy.  Objective: BP 127/76   Pulse (!) 111   Temp 98.3 F (36.8 C) (Oral)   Resp 16   Ht 5\' 2"  (1.575 m)   Wt 110.2 kg   LMP 05/01/2017 (Exact Date) Comment: Pt had a miscarriage 06/26/17  SpO2 98%   BMI 44.45 kg/m  Gen: tired-appearing, uncomfortable with contractions Dilation: 7 Effacement (%): 90 Station: -2 Presentation: Vertex Exam by:: Suezanne JacquetJ WIlliams, RN  Assessment and Plan: 32 y.o. [id number] 761w0d here for spontaneous onset of labor.   Labor: Expectant management. Consider AROM once 2nd dose of PCN given for GBS status.  -- pain control: epidural in place -- PPH Risk: moderate given BMI  Fetal Well-Being: EFW 2692g (56%) at 35w6 . Cephalic by prior checks, BSUS on admission.  -- Category I - continuous fetal monitoring  -- GBS positive - PCN - next dose due around 0200    Laurie Starliper S. Earlene PlaterWallace, DO OB/GYN Fellow, Faculty Practice  1:34 AM

## 2018-04-04 NOTE — Op Note (Signed)
Laurie Reed 04/04/2018  PREOPERATIVE DIAGNOSIS:  Multiparity, undesired fertility  POSTOPERATIVE DIAGNOSIS:  Multiparity, undesired fertility  PROCEDURE:  Postpartum Bilateral Tubal Sterilization using Filshie Clips   ANESTHESIA:  Epidural and local analgesia using 0.5% Marcaine  COMPLICATIONS:  None immediate.  ESTIMATED BLOOD LOSS: 5 ml.  FLUIDS: 200 ml LR.  URINE OUTPUT:  125 ml of clear urine.  INDICATIONS: 32 y.o. [id number]  with undesired fertility,status post vaginal delivery, desires permanent sterilization.  Other reversible forms of contraception were discussed with patient; she declines all other modalities. Risks of procedure discussed with patient including but not limited to: risk of regret, permanence of method, bleeding, infection, injury to surrounding organs and need for additional procedures.  Failure risk of 0.5-1% with increased risk of ectopic gestation if pregnancy occurs was also discussed with patient.     FINDINGS:  Normal uterus, tubes, and ovaries.  PROCEDURE DETAILS: The patient was taken to the operating room where her epidural anesthesia was dosed up to surgical level and found to be adequate.  She was then placed in the dorsal supine position and prepped and draped in sterile fashion.  After an adequate timeout was performed, attention was turned to the patient's abdomen where a small transverse skin incision was made under the umbilical fold. The incision was taken down to the layer of fascia using the scalpel, and fascia was incised, and extended bilaterally using Mayo scissors. The peritoneum was entered in a sharp fashion. The patient was placed in Trendelenburg.  A moist lap pad was used to move omentum and bowel away until the left fallopian tube was identified and grasped with a Babcock clamp, and followed out to the fimbriated end.  A Filshie clip was placed on the left fallopian tube about 3 cm from the cornual attachment, with care given to  incorporate the underlying mesosalpinx.  A similar process was carried out on the right side allowing for bilateral tubal sterilization.  Good hemostasis was noted overall.  The instruments were then removed from the patient's abdomen and the fascial incision was repaired with 0 Vicryl, and the skin was closed with a 4-0 Vicryl subcuticular stitch. 30cc of 0.5% Marcaine was injected into the incision. The patient tolerated the procedure well.  Instrument, sponge, and needle counts were correct times two.  The patient was then taken to the recovery room awake and in stable condition.  Gwenevere AbbotNimeka Shaindel Sweeten, MD  Ob Fellow 04/04/18 3:15 PM

## 2018-04-04 NOTE — Addendum Note (Signed)
Addendum  created 04/04/18 1405 by Elgie CongoMalinova, Maxton Noreen H, CRNA   Charge Capture section accepted, Sign clinical note

## 2018-04-04 NOTE — Progress Notes (Signed)
OB/GYN Faculty Practice: Labor Progress Note  Subjective: Tired, feeling lots of pressure.   Objective: BP 115/63   Pulse (!) 106   Temp 99.3 F (37.4 C) (Axillary)   Resp 18   Ht 5\' 2"  (1.575 m)   Wt 110.2 kg   LMP 05/01/2017 (Exact Date) Comment: Pt had a miscarriage 06/26/17  SpO2 98%   BMI 44.45 kg/m  Gen: tired-appearing, uncomfortable with contractions Dilation: 10 Dilation Complete Date: 04/04/18 Dilation Complete Time: 0537 Effacement (%): 100 Station: Plus 2 Presentation: Vertex Exam by:: Dr. Earlene PlaterWallace  Assessment and Plan: 32 y.o. [id number] 6149w0d here for spontaneous onset of labor.   Labor: Stage II, started pushing at 0550. Started pitocin (2) to help with contraction intensity.  -- AROM 0330 clear fluid -- consider pitocin if no change at next check  -- pain control: epidural in place -- PPH Risk: moderate given BMI  Fetal Well-Being: EFW 2692g (56%) at 35w6  Leopold's 8-9lbs . Cephalic by prior checks, BSUS on admission.  -- Category I - continuous fetal monitoring  -- GBS positive - PCN   Amanpreet Delmont S. Earlene PlaterWallace, DO OB/GYN Fellow, Faculty Practice  6:26 AM

## 2018-04-04 NOTE — Anesthesia Postprocedure Evaluation (Signed)
Anesthesia Post Note  Patient: Laurie Reed  Procedure(s) Performed: AN AD HOC LABOR EPIDURAL     Patient location during evaluation: Mother Baby Anesthesia Type: Epidural Level of consciousness: awake and alert Pain management: pain level controlled Vital Signs Assessment: post-procedure vital signs reviewed and stable Respiratory status: spontaneous breathing, nonlabored ventilation and respiratory function stable Cardiovascular status: stable Postop Assessment: no headache, no backache and epidural receding Anesthetic complications: no    Last Vitals:  Vitals:   04/04/18 1217 04/04/18 1230  BP: 124/71 131/79  Pulse: (!) 101 99  Resp: 18 18  Temp:    SpO2:      Last Pain:  Vitals:   04/04/18 1245  TempSrc:   PainSc: 0-No pain   Pain Goal:                 Sheamus Hasting

## 2018-04-04 NOTE — Anesthesia Postprocedure Evaluation (Signed)
Anesthesia Post Note  Patient: Laurie Reed  Procedure(s) Performed: AN AD HOC LABOR EPIDURAL     Patient location during evaluation: Mother Baby Anesthesia Type: Epidural Level of consciousness: awake and alert Pain management: pain level controlled Vital Signs Assessment: post-procedure vital signs reviewed and stable Respiratory status: spontaneous breathing, nonlabored ventilation and respiratory function stable Cardiovascular status: stable Postop Assessment: no headache, no backache, epidural receding, no apparent nausea or vomiting, patient able to bend at knees, adequate PO intake and able to ambulate Anesthetic complications: no    Last Vitals:  Vitals:   04/04/18 1217 04/04/18 1230  BP: 124/71 131/79  Pulse: (!) 101 99  Resp: 18 18  Temp:    SpO2:      Last Pain:  Vitals:   04/04/18 1245  TempSrc:   PainSc: 0-No pain   Pain Goal:                 Land O'LakesMalinova,Aryanna Shaver Hristova

## 2018-04-05 NOTE — Anesthesia Postprocedure Evaluation (Signed)
Anesthesia Post Note  Patient: Laurie Reed  Procedure(s) Performed: POST PARTUM TUBAL LIGATION (N/A Abdomen)     Patient location during evaluation: Mother Baby Anesthesia Type: Epidural Level of consciousness: awake and alert Pain management: pain level controlled Vital Signs Assessment: post-procedure vital signs reviewed and stable Respiratory status: spontaneous breathing, nonlabored ventilation and respiratory function stable Cardiovascular status: stable Postop Assessment: no headache, no backache and epidural receding Anesthetic complications: no    Last Vitals:  Vitals:   04/04/18 2327 04/05/18 0353  BP:  119/70  Pulse: (!) 102 84  Resp:  20  Temp: 36.8 C (!) 36.4 C  SpO2:      Last Pain:  Vitals:   04/05/18 0353  TempSrc: Oral  PainSc:    Pain Goal: Patients Stated Pain Goal: 0 (04/04/18 1835)               Fanny DanceMULLINS,Laurie Penado

## 2018-04-05 NOTE — Progress Notes (Signed)
MOB was referred for history of depression/anxiety. * Referral screened out by Clinical Social Worker because none of the following criteria appear to apply: ~ History of anxiety/depression during this pregnancy, or of post-partum depression following prior delivery. ~ Diagnosis of anxiety and/or depression within last 3 years; no concerns noted in OB records.  OR * MOB's symptoms currently being treated with medication and/or therapy.  Please contact the Clinical Social Worker if needs arise, by MOB request, or if MOB scores greater than 9/yes to question 10 on Edinburgh Postpartum Depression Screen.  Wylene Weissman Boyd-Gilyard, MSW, LCSW Clinical Social Work [phone]  

## 2018-04-05 NOTE — Progress Notes (Signed)
POSTPARTUM PROGRESS NOTE  Post Partum Day 1  Subjective:  Laurie Reed is a 32 y.o. [id number] s/p SVD at 2627w0d c/by Triple I (completed tx with amp/gent) and POD#1 s/p BTL.  She reports she is doing well. NAEON. Voiding well. Ambulating without difficulty and no SOB, dizziness, HA, lightheadedness. Passing gas. No BMs. Lochia is slowing and continues to improve. Pain is well controlled. Pt states she is primarily experiencing diffuse abdominal discomfort this morning, which she attributes to moving around more and getting up to tend to baby. No fevers, chills.  Objective: Blood pressure 119/70, pulse 84, temperature (!) 97.5 F (36.4 C), temperature source Oral, resp. rate 20, height 5\' 2"  (1.575 m), weight 110.2 kg, last menstrual period 05/01/2017, SpO2 100 %, unknown if currently breastfeeding.  Physical Exam:  General: alert, cooperative and no distress Chest: no respiratory distress Heart:regular rate, distal pulses intact Abdomen: soft, mildly TTP throughout abdomen Uterine Fundus: firm, appropriately tender DVT Evaluation: No calf swelling or tenderness Extremities: No edema Skin: warm, dry  Recent Labs    04/03/18 1944  HGB 10.8*  HCT 34.0*    Assessment/Plan: Laurie Reed is a 32 y.o. [id number] s/p SVD at 5927w0d and s/p BTL (04/04/18)   PPD#1 and POD#1 s/p BTL - Doing well  Routine postpartum care  Continue wound care of surgical site  Contraception: BTL (completed 04/04/18) Feeding: Breast and bottle Dispo: Plan for discharge tomorrow   LOS: 2 days   Raul DelFrancie Jonni Oelkers, MS3 04/05/2018, 7:24 AM

## 2018-04-06 MED ORDER — OXYCODONE HCL 5 MG PO TABS: 5.0000 mg | ORAL_TABLET | ORAL | 0 refills | Status: DC | PRN

## 2018-04-06 MED ORDER — IBUPROFEN 600 MG PO TABS: 600.0000 mg | ORAL_TABLET | Freq: Four times a day (QID) | ORAL | 0 refills | Status: DC

## 2018-04-06 MED ORDER — SENNOSIDES-DOCUSATE SODIUM 8.6-50 MG PO TABS: 2.0000 | ORAL_TABLET | ORAL | 0 refills | Status: DC

## 2018-04-06 NOTE — Discharge Instructions (Signed)

## 2018-04-06 NOTE — Discharge Summary (Addendum)
Postpartum Discharge Summary     Patient Name: Laurie Reed DOB: 1985-12-07 MRN: [id number]  Date of admission: 04/03/2018 Delivering Provider: Cam Hai D   Date of discharge: 04/06/2018  Admitting diagnosis: 38 wks ctx every 3 to 5 min saw phys today and was 3 cm dilated  Intrauterine pregnancy: [redacted]w[redacted]d     Secondary diagnosis:  Active Problems:   Anxiety and depression   H/O gestational diabetes in prior pregnancy, currently pregnant   Morbid obesity with BMI of 45.0-49.9, adult (HCC)   Group B Streptococcus carrier, +RV culture, currently pregnant   Unwanted fertility   Indication for care in labor or delivery   Labor and delivery, indication for care  Additional problems:   H/o GDM without insulin use - unable to complete GTT this pregnancy because of emesis   Large for gestational age  Morbid obesity of mother     Discharge diagnosis: Term Pregnancy Delivered                                                                                                Post partum procedures:none  Augmentation: AROM and Pitocin  Complications: Intrauterine Inflammation or infection (Chorioamniotis)  Hospital course:  Onset of Labor With Vaginal Delivery     32 y.o. yo [id number] at [redacted]w[redacted]d was admitted in Latent Labor on 04/03/2018. Patient had an uncomplicated labor course as follows:  Membrane Rupture Time/Date: 3:25 AM ,04/04/2018   Intrapartum Procedures: Episiotomy: None [1]                                         Lacerations:  None [1]  Patient had a delivery of a Viable infant. 04/04/2018  Information for the patient's newborn:  Dakisha, Schoof [[id number]]  Delivery Method: Vag-Spont    Pateint had an uncomplicated postpartum course.  She is ambulating, tolerating a regular diet, passing flatus, and urinating well. Patient is discharged home in stable condition on 04/06/18.   Magnesium Sulfate recieved: No BMZ received: No  Physical exam  Vitals:   04/05/18 0820 04/05/18 1448 04/05/18 2300 04/06/18 0511  BP: 115/78 111/84 (!) 109/43 (!) 110/55  Pulse: 85 93 92 88  Resp: 18 19 18 16   Temp: 98 F (36.7 C) 98.2 F (36.8 C) 98.5 F (36.9 C) 98 F (36.7 C)  TempSrc: Oral Oral Oral Oral  SpO2:   100% 98%  Weight:      Height:       General: alert, cooperative and no distress Lochia: appropriate Uterine Fundus: firm Incision: Healing well with no significant drainage, No significant erythema, Dressing is clean, dry, and intact DVT Evaluation: No evidence of DVT seen on physical exam. No cords or calf tenderness. No significant calf/ankle edema. Labs: Lab Results  Component Value Date   WBC 12.7 (H) 04/03/2018   HGB 10.8 (L) 04/03/2018   HCT 34.0 (L) 04/03/2018   MCV 81.1 04/03/2018   PLT 244 04/03/2018   CMP Latest Ref Rng & Units 03/18/2018  Glucose  70 - 99 mg/dL 93  BUN 6 - 20 mg/dL 7  Creatinine 8.290.44 - 5.621.00 mg/dL 1.300.62  Sodium 865135 - 784145 mmol/L 137  Potassium 3.5 - 5.1 mmol/L 3.5  Chloride 98 - 111 mmol/L 106  CO2 22 - 32 mmol/L 23  Calcium 8.9 - 10.3 mg/dL 6.9(G8.4(L)  Total Protein 6.5 - 8.1 g/dL 6.4(L)  Total Bilirubin 0.3 - 1.2 mg/dL 0.4  Alkaline Phos 38 - 126 U/L 126  AST 15 - 41 U/L 13(L)  ALT 0 - 44 U/L 9    Discharge instruction: per After Visit Summary and "Baby and Me Booklet".  After visit meds:  Allergies as of 04/06/2018      Reactions   Other Anaphylaxis   "20 different types of trees"   Latex Hives, Itching   Zofran Itching      Medication List    STOP taking these medications   butalbital-acetaminophen-caffeine 50-325-40 MG tablet Commonly known as:  FIORICET, ESGIC     TAKE these medications   acetaminophen 500 MG tablet Commonly known as:  TYLENOL Take 500 mg by mouth every 6 (six) hours as needed for moderate pain or headache.   albuterol 108 (90 Base) MCG/ACT inhaler Commonly known as:  PROVENTIL HFA;VENTOLIN HFA Inhale 2 puffs into the lungs every 6 (six) hours as needed for  wheezing or shortness of breath.   aspirin 81 MG chewable tablet Chew 1 tablet (81 mg total) by mouth daily.   cetirizine 10 MG tablet Commonly known as:  ZYRTEC Take 1 tablet (10 mg total) by mouth daily.   cyclobenzaprine 10 MG tablet Commonly known as:  FLEXERIL Take 1 tablet (10 mg total) by mouth every 8 (eight) hours as needed (back pain).   famotidine 20 MG tablet Commonly known as:  PEPCID Take 1 tablet (20 mg total) by mouth 2 (two) times daily.   ferrous sulfate 325 (65 FE) MG tablet Take 1 tablet (325 mg total) by mouth 2 (two) times daily with a meal.   ibuprofen 600 MG tablet Commonly known as:  ADVIL,MOTRIN Take 1 tablet (600 mg total) by mouth every 6 (six) hours.   oxyCODONE 5 MG immediate release tablet Commonly known as:  Oxy IR/ROXICODONE Take 1 tablet (5 mg total) by mouth every 4 (four) hours as needed (pain scale 4-7).   Prenatal Vitamins 0.8 MG tablet Take 1 tablet by mouth daily.   scopolamine 1 MG/3DAYS Commonly known as:  TRANSDERM-SCOP Place 1 patch onto the skin every 3 (three) days.   senna-docusate 8.6-50 MG tablet Commonly known as:  Senokot-S Take 2 tablets by mouth daily. Start taking on:  04/07/2018       Diet: No evidence of DVT seen on physical exam. No cords or calf tenderness. No significant calf/ankle edema.  Activity: Advance as tolerated. Pelvic rest for 6 weeks.   Outpatient follow up:6 weeks Follow up Appt: Future Appointments  Date Time Provider Department Center  05/16/2018 10:55 AM Gwenevere AbbotPhillip, Nimeka, MD [facility]   Follow up Visit:   Please schedule this patient for Postpartum visit in: 6 weeks with the following provider: Any provider For C/S patients schedule nurse incision check in weeks 2 weeks: no Low risk pregnancy complicated by:  Delivery mode:  SVD Anticipated Birth Control:  BTL done PP PP Procedures needed:   Schedule Integrated BH visit: yes    Newborn Data: Live born female  Birth Weight: 6  lb 5.4 oz (2875 g) APGAR: 8, 9  Newborn Delivery  Birth date/time:  04/04/2018 11:05:00 Delivery type:  Vaginal, Spontaneous     Baby Feeding: Bottle and Breast Disposition:home with mother   04/06/2018 [place] Shirley, DO   OB FELLOW DISCHARGE ATTESTATION  I have seen and examined this patient and agree with above documentation in the resident's note.   Marcy Siren, D.O. OB Fellow  04/06/2018, 7:58 AM

## 2018-04-10 ENCOUNTER — Encounter (HOSPITAL_COMMUNITY): Payer: Self-pay

## 2018-04-10 ENCOUNTER — Encounter: Payer: Medicaid Other | Admitting: Obstetrics & Gynecology

## 2018-05-16 ENCOUNTER — Encounter: Payer: Self-pay | Admitting: Family Medicine

## 2018-05-16 ENCOUNTER — Ambulatory Visit (INDEPENDENT_AMBULATORY_CARE_PROVIDER_SITE_OTHER): Payer: Medicaid Other | Admitting: Family Medicine

## 2018-05-16 DIAGNOSIS — Z1389 Encounter for screening for other disorder: Secondary | ICD-10-CM | POA: Diagnosis not present

## 2018-05-16 NOTE — Progress Notes (Signed)
Subjective:     Laurie Reed is a 32 y.o. female who presents for a postpartum visit. She is 6 weeks postpartum following a spontaneous vaginal delivery. I have fully reviewed the prenatal and intrapartum course. The delivery was at 38.6 gestational weeks. Outcome: spontaneous vaginal delivery. Anesthesia: epidural. Postpartum course has been uncomplicated. Baby's course has been complicated by concern for pyloric stenosis v GERD. Baby is feeding by both breast and bottle - gerber soy. Bleeding moderate lochia. Bowel function is normal. Bladder function is normal. Patient is sexually active. Contraception method is tubal ligation. Postpartum depression screening: negative.   Review of Systems A comprehensive review of systems was negative.   Objective:    BP 112/72   Pulse 72   Ht 5\' 2"  (1.575 m)   Wt 231 lb 12.8 oz (105.1 kg)   LMP 05/01/2017 (Exact Date) Comment: Pt had a miscarriage 06/26/17  Breastfeeding Yes   BMI 42.40 kg/m   General:  alert, cooperative, appears stated age and no distress   Breasts:  deferred  Lungs: clear to auscultation bilaterally, nonlabored with normal respiratory effort  Heart:  regular rate and rhythm, S1, S2 normal, no murmur, click, rub or gallop  Abdomen: soft, non-tender; bowel sounds normal; no masses,  no organomegaly. BTL incision c/d/i, well-healed with mild keloid formation  GU: deferred  Neuro:  Alert and oriented. Cranial nerves grossly intact. Normal gait  Psych: Normal judgement and behavior        Assessment:     Normal postpartum exam. Pap smear not done at today's visit.   Plan:    1. Contraception: tubal ligation 2. Pap up to date 3. Follow up in: 4 years or as needed.

## 2018-05-31 IMAGING — US US OB < 14 WEEKS - US OB TV
1 series · 15 of 28 positions shown · non-contrast
Comparison: 05/31/2017

CLINICAL DATA: Lower abdominal pain, quantitative HCG 236

EXAM:
OBSTETRIC <14 WK US AND TRANSVAGINAL OB US
TECHNIQUE: Both transabdominal and transvaginal ultrasound examinations were
performed for complete evaluation of the gestation as well as the
maternal uterus, adnexal regions, and pelvic cul-de-sac.
Transvaginal technique was performed to assess early pregnancy.

[Series 1: us ob < 14 weeks - us ob tv · 15 of 54 slices shown]
[im 1/54]
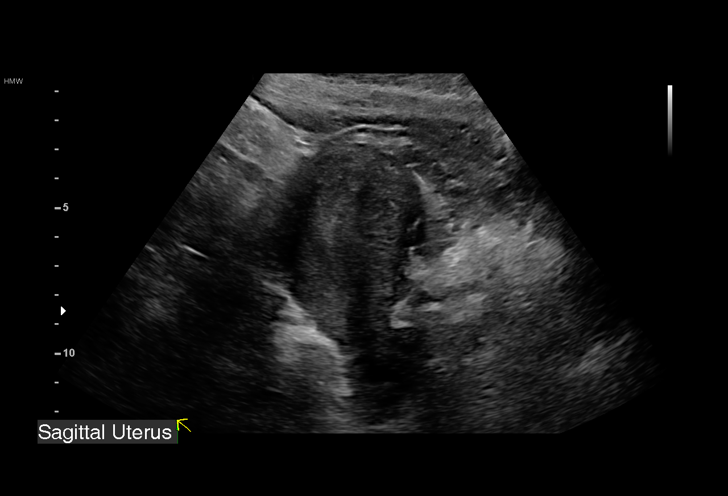
[im 4/54]
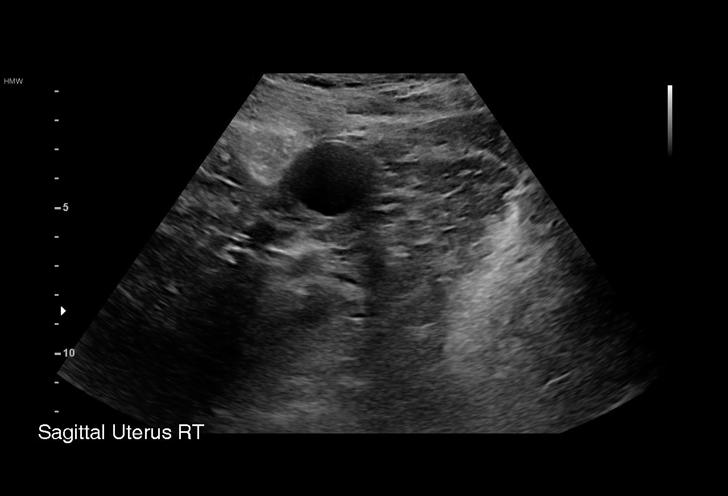
[im 8/54]
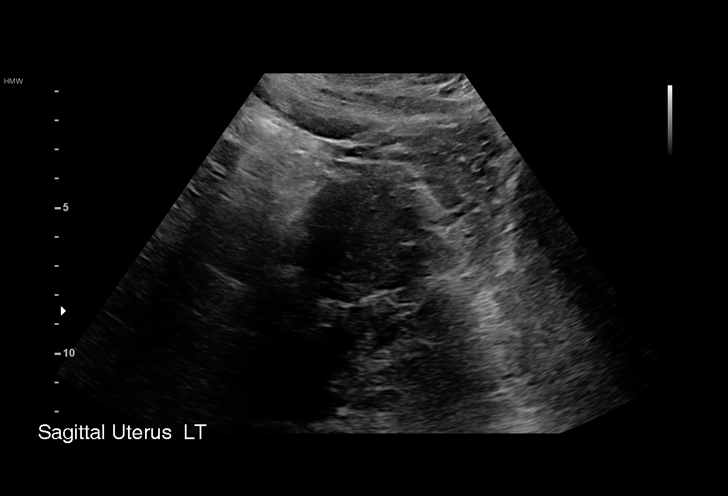
[im 12/54]
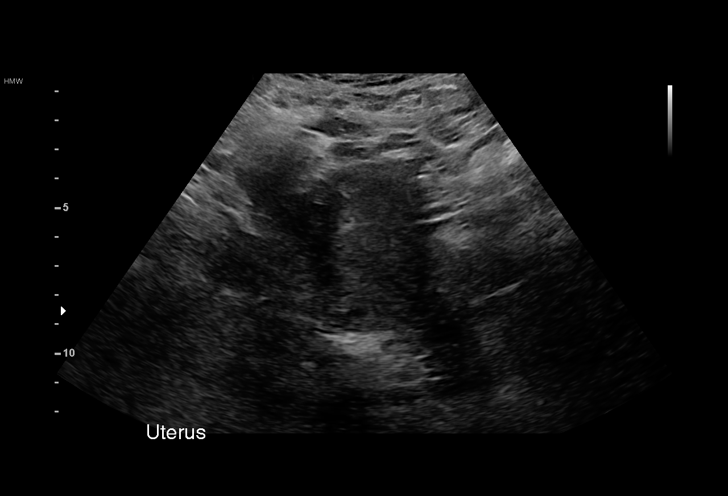
[im 16/54]
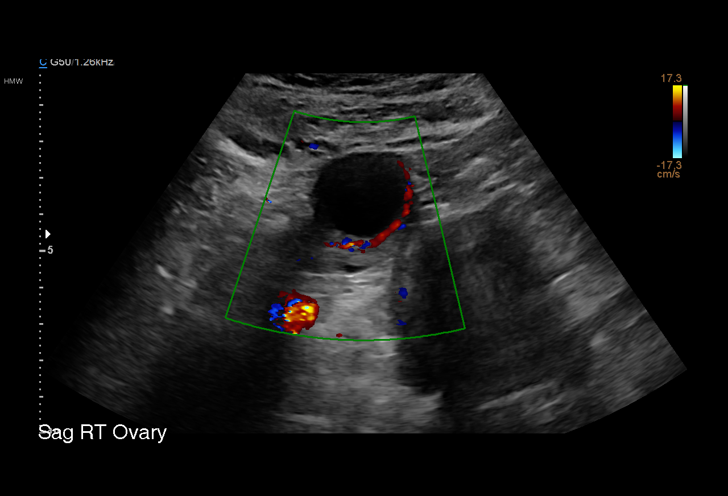
[im 20/54]
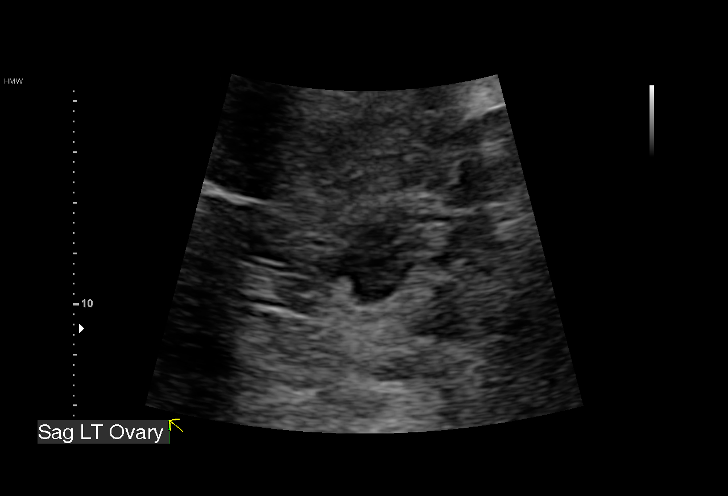
[im 24/54]
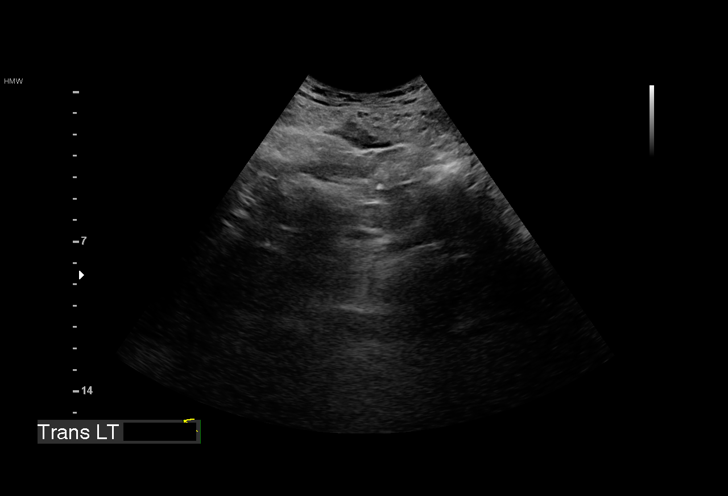
[im 28/54]
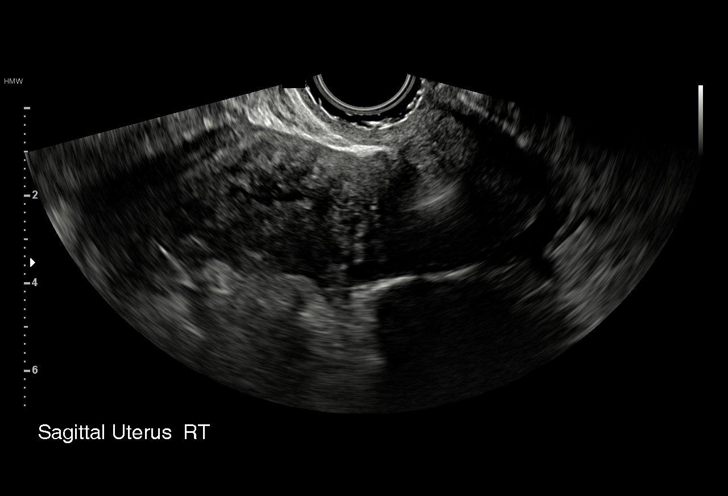
[im 30/54]
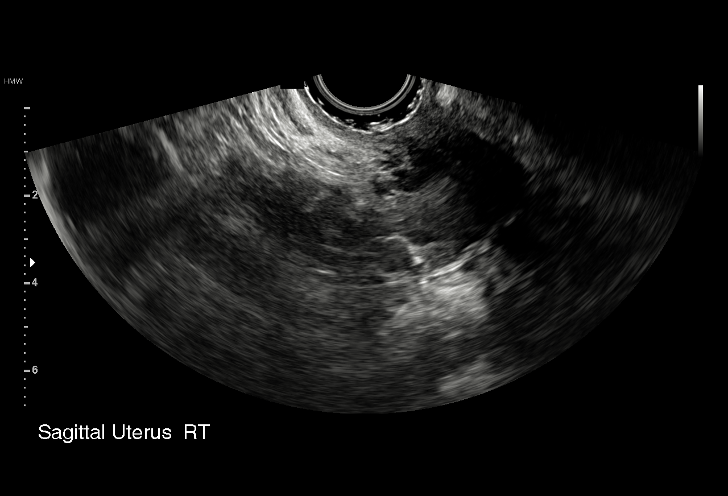
[im 34/54]
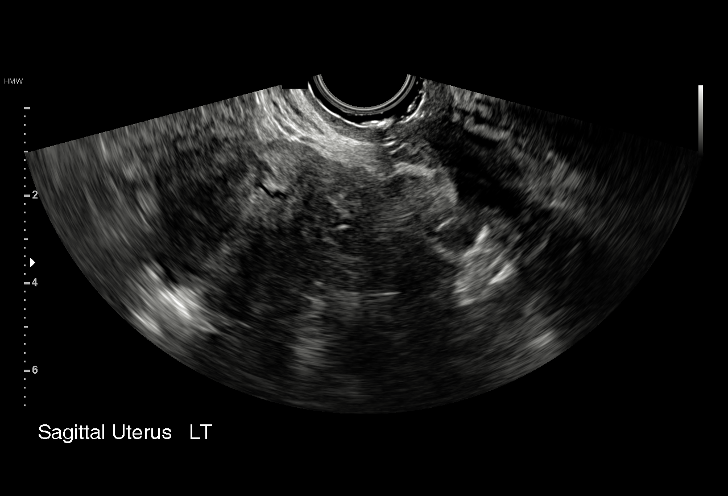
[im 38/54]
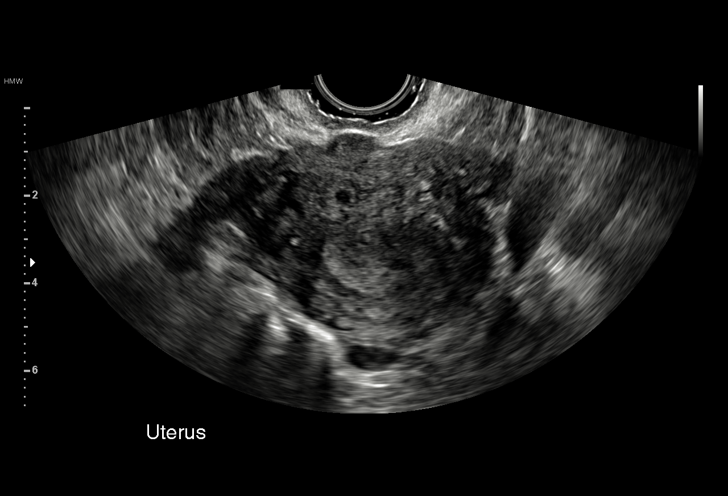
[im 42/54]
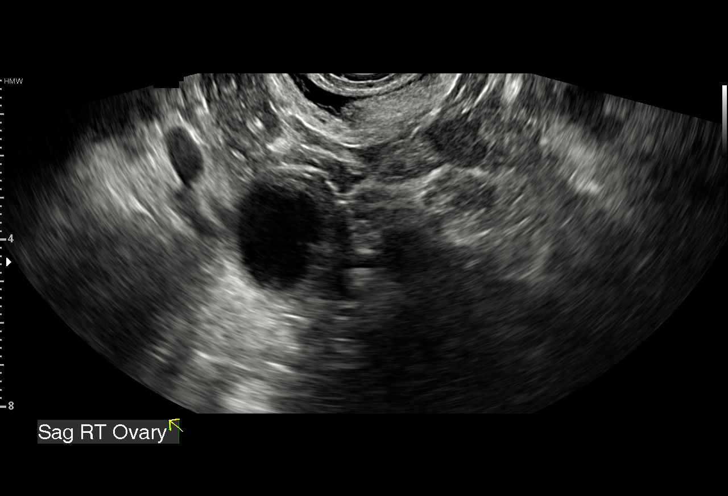
[im 46/54]
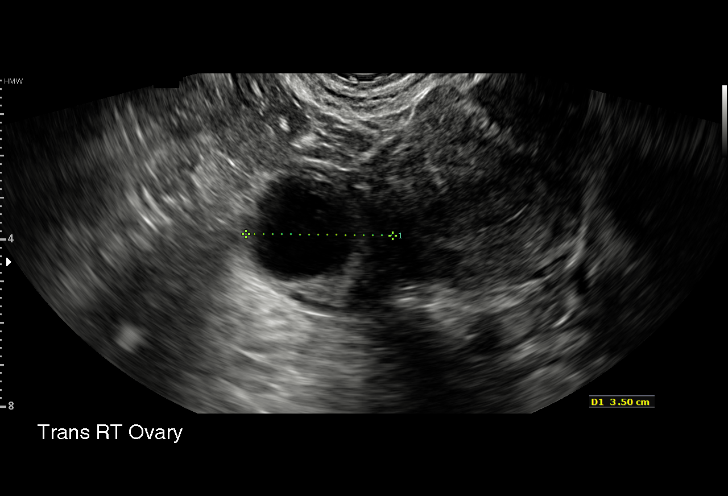
[im 50/54]
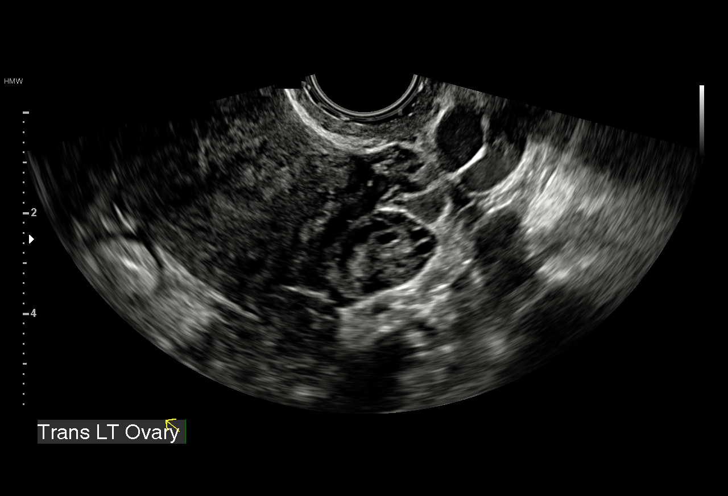
[im 54/54]
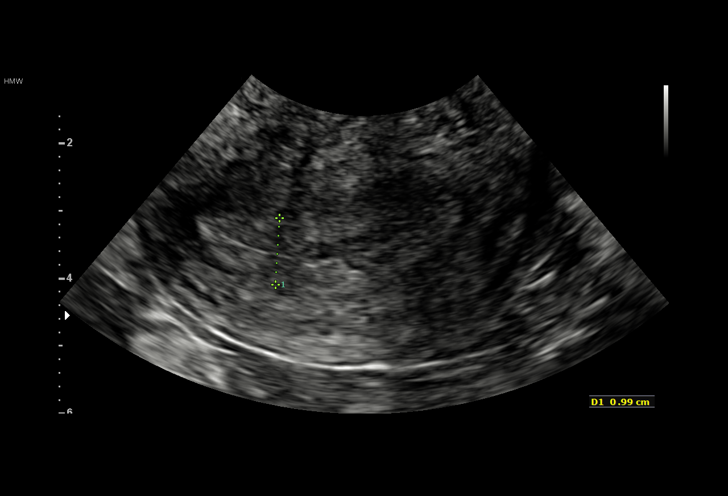

[15 of 28 positions shown; findings below may reference images not displayed]

FINDINGS: Intrauterine gestational sac: Not seen

Yolk sac:  Not seen

Embryo:  Not seen

Maternal uterus/adnexae: Ovaries are within normal limits. The left
ovary measures 2 by 2 x 1.6 cm. The right ovary measures 3.5 x 3.3 x
2.9 cm. Trace free fluid
IMPRESSION: No intrauterine pregnancy is visualized. Findings consistent with
pregnancy of unknown location, differential of which includes IUP
too early to visualize, recent failed pregnancy, and occult ectopic.
Suggest trending of HCG with repeat ultrasound as indicated.

Trace free fluid in the pelvis

## 2018-06-06 ENCOUNTER — Emergency Department (HOSPITAL_COMMUNITY)
Admission: EM | Admit: 2018-06-06 | Discharge: 2018-06-06 | Disposition: A | Discharge status: Home / Self Care | Payer: Medicaid Other | Attending: Emergency Medicine | Admitting: Emergency Medicine

## 2018-06-06 ENCOUNTER — Encounter (HOSPITAL_COMMUNITY): Payer: Self-pay

## 2018-06-06 ENCOUNTER — Emergency Department (HOSPITAL_COMMUNITY): Payer: Medicaid Other

## 2018-06-06 ENCOUNTER — Other Ambulatory Visit: Payer: Self-pay

## 2018-06-06 DIAGNOSIS — K0889 Other specified disorders of teeth and supporting structures: Secondary | ICD-10-CM | POA: Diagnosis not present

## 2018-06-06 DIAGNOSIS — J45909 Unspecified asthma, uncomplicated: Secondary | ICD-10-CM | POA: Diagnosis not present

## 2018-06-06 DIAGNOSIS — R69 Illness, unspecified: Secondary | ICD-10-CM

## 2018-06-06 DIAGNOSIS — J111 Influenza due to unidentified influenza virus with other respiratory manifestations: Secondary | ICD-10-CM | POA: Diagnosis not present

## 2018-06-06 DIAGNOSIS — Z79899 Other long term (current) drug therapy: Secondary | ICD-10-CM | POA: Diagnosis present

## 2018-06-06 MED ORDER — GUAIFENESIN 100 MG/5ML PO SYRP: 100.0000 mg | ORAL_SOLUTION | ORAL | 0 refills | Status: DC | PRN

## 2018-06-06 MED ORDER — OSELTAMIVIR PHOSPHATE 75 MG PO CAPS: 75.0000 mg | ORAL_CAPSULE | Freq: Two times a day (BID) | ORAL | 0 refills | Status: DC

## 2018-06-06 MED ORDER — LIDOCAINE HCL (PF) 1 % IJ SOLN: 2.0000 mL | Freq: Once | INTRAMUSCULAR | Status: DC

## 2018-06-06 MED ORDER — ACETAMINOPHEN 325 MG PO TABS
650.0000 mg | ORAL_TABLET | Freq: Once | ORAL | Status: AC | PRN
  Administered 2018-06-06: 650 mg via ORAL
  Filled 2018-06-06: qty 2

## 2018-06-06 MED ORDER — SODIUM CHLORIDE 0.9 % IV BOLUS
1000.0000 mL | Freq: Once | INTRAVENOUS | Status: AC
  Administered 2018-06-06: 1000 mL via INTRAVENOUS

## 2018-06-06 MED ORDER — KETOROLAC TROMETHAMINE 15 MG/ML IJ SOLN
15.0000 mg | Freq: Once | INTRAMUSCULAR | Status: AC
  Administered 2018-06-06: 15 mg via INTRAVENOUS
  Filled 2018-06-06: qty 1

## 2018-06-06 NOTE — ED Provider Notes (Signed)
[facility]-EMERGENCY DEPT Provider Note   CSN: [id number] Arrival date & time: 06/06/18  1237   History   Chief Complaint Chief Complaint  Patient presents with  . Generalized Body Aches  . Cough  . Dental Pain    HPI Laurie Reed is a 33 y.o. female with no significant past medical history who presents for evaluation of multiple complaints.  Patient states she has had generalized body aches and pains, nonproductive cough, headache, rhinorrhea, nasal congestion, fever x24 hours.  Patient states symptoms started suddenly.  Patient did not receive influenza vaccine.  Patient states her son was diagnosed with influenza B on Sunday evening.  Patient states she is not taken anything for symptoms PTA.  Patient states she has also had dental pain located to her left bottom molars.  Patient states she has had issues with this tooth in the past and was told by dentistry she need to have it removed as it is fractured.  Patient states she cannot afford to follow-up with dentistry.  Denies vision changes, neck pain, neck stiffness, chest pain, shortness of breath, abdominal pain, nausea, vomiting, diarrhea.  History obtained from patient.  No interpreter was used.  HPI  Past Medical History:  Diagnosis Date  . Acid reflux 2004  . Anemia   . Anxiety 2008  . Asthma 1993   no previous intubations or hospitalizations   . Chlamydia   . Depression 2008   no meds, currenly ok  . Environmental allergies   . Migraines   . Miscarriage   . Ovarian cyst   . Pre-diabetes   . Ulcer of the stomach and intestine 2004  . Urinary tract infection     Patient Active Problem List   Diagnosis Date Noted  . Anemia in pregnancy 01/23/2018  . Morbid obesity with BMI of 45.0-49.9, adult (HCC) 10/19/2017  . Seasonal allergic rhinitis 11/01/2016  . Asthma 06/30/2015  . Vitamin D deficiency 01/22/2015  . Anxiety and depression 01/21/2015    Past Surgical History:  Procedure  Laterality Date  . COLONOSCOPY  2004   . IRRIGATION AND DEBRIDEMENT SEBACEOUS CYST N/A 06/01/2017   Procedure: EXCISION SEBACEOUS CYST ON SCALP;  Surgeon: Ancil Linsey, MD;  Location: [facility] ORS;  Service: General;  Laterality: N/A;  . MULTIPLE TOOTH EXTRACTIONS  2015   6 teeth removed   . TUBAL LIGATION N/A 04/04/2018   Procedure: POST PARTUM TUBAL LIGATION;  Surgeon: Levie Heritage, DO;  Location: [facility];  Service: Gynecology;  Laterality: N/A;  . WISDOM TOOTH EXTRACTION  2007     OB History    Gravida  7   Para  4   Term  4   Preterm  0   AB  3   Living  4     SAB  3   TAB  0   Ectopic  0   Multiple  0   Live Births  4            Home Medications    Prior to Admission medications   Medication Sig Start Date End Date Taking? Authorizing Provider  acetaminophen (TYLENOL) 500 MG tablet Take 500 mg by mouth every 6 (six) hours as needed for moderate pain or headache.    Yes [provider]  albuterol (PROVENTIL HFA;VENTOLIN HFA) 108 (90 Base) MCG/ACT inhaler Inhale 2 puffs into the lungs every 6 (six) hours as needed for wheezing or shortness of breath.  06/30/15  Yes [provider]  cetirizine (ZYRTEC) 10 MG tablet Take 1 tablet (10 mg total) by mouth daily. Patient taking differently: Take 10 mg by mouth daily as needed for allergies.  12/07/17  Yes Judeth Horn, NP  cyclobenzaprine (FLEXERIL) 10 MG tablet Take 1 tablet (10 mg total) by mouth every 8 (eight) hours as needed (back pain). Patient taking differently: Take 10 mg by mouth daily as needed (back pain).  02/07/18  Yes Donette Larry, CNM  ferrous sulfate 325 (65 FE) MG tablet Take 1 tablet (325 mg total) by mouth 2 (two) times daily with a meal. 02/03/18  Yes Arvilla Market, DO  ibuprofen (ADVIL,MOTRIN) 600 MG tablet Take 1 tablet (600 mg total) by mouth every 6 (six) hours. 04/06/18  Yes Talbert Forest, [place], DO  senna-docusate (SENOKOT-S) 8.6-50 MG tablet Take 2  tablets by mouth daily. Patient taking differently: Take 2 tablets by mouth daily as needed (constipation).  04/07/18  Yes Shirley, [place], DO  aspirin 81 MG chewable tablet Chew 1 tablet (81 mg total) by mouth daily. Patient not taking: Reported on 03/20/2018 11/09/17   Judeth Horn, NP  famotidine (PEPCID) 20 MG tablet Take 1 tablet (20 mg total) by mouth 2 (two) times daily. Patient not taking: Reported on 06/06/2018 11/14/17   Levie Heritage, DO  guaifenesin (ROBITUSSIN) 100 MG/5ML syrup Take 5-10 mLs (100-200 mg total) by mouth every 4 (four) hours as needed for cough. 06/06/18   Nollan Muldrow A, PA-C  oseltamivir (TAMIFLU) 75 MG capsule Take 1 capsule (75 mg total) by mouth every 12 (twelve) hours. 06/06/18   Delmer Kowalski A, PA-C  oxyCODONE (OXY IR/ROXICODONE) 5 MG immediate release tablet Take 1 tablet (5 mg total) by mouth every 4 (four) hours as needed (pain scale 4-7). Patient not taking: Reported on 06/06/2018 04/06/18   Shirley, [place], DO  Prenatal Multivit-Min-Fe-FA (PRENATAL VITAMINS) 0.8 MG tablet Take 1 tablet by mouth daily. Patient not taking: Reported on 06/06/2018 06/12/17   Hurshel Party, CNM    Family History Family History  Problem Relation Age of Onset  . Asthma Mother   . Heart disease Mother   . Hypertension Mother   . Asthma Maternal Aunt   . Hypertension Maternal Aunt   . Diabetes Maternal Aunt   . Asthma Maternal Uncle   . Hypertension Maternal Uncle   . Diabetes Maternal Uncle   . Heart disease Maternal Grandmother   . Diabetes Maternal Grandmother   . Hypertension Maternal Grandmother   . Heart disease Maternal Grandfather   . Diabetes Maternal Grandfather   . Hypertension Maternal Grandfather   . Hypertension Paternal Grandmother   . Hypotension Neg Hx   . Anesthesia problems Neg Hx   . Malignant hyperthermia Neg Hx   . Pseudochol deficiency Neg Hx   . Cancer Neg Hx     Social History Social History   Tobacco Use  . Smoking  status: Never Smoker  . Smokeless tobacco: Never Used  Substance Use Topics  . Alcohol use: No    Alcohol/week: 0.0 standard drinks  . Drug use: No     Allergies   Other; Latex; and Zofran   Review of Systems Review of Systems  Constitutional: Positive for fever.  HENT: Positive for congestion, dental problem, postnasal drip and rhinorrhea. Negative for ear discharge, ear pain, facial swelling, mouth sores, nosebleeds, sinus pressure, sinus pain, sneezing, sore throat, tinnitus and voice change.   Eyes: Negative.   Respiratory: Positive for cough. Negative for choking, chest tightness,  shortness of breath, wheezing and stridor.   Cardiovascular: Negative.   Gastrointestinal: Negative.   Genitourinary: Negative.   Musculoskeletal:       Generalized body aches and pains.  Skin: Negative.   Neurological: Negative.   All other systems reviewed and are negative.    Physical Exam Updated Vital Signs BP 109/67   Pulse 97   Temp 99.3 F (37.4 C) (Oral)   Resp 16   Ht 5\' 2"  (1.575 m)   Wt 108.4 kg   LMP 05/20/2018 (Approximate)   SpO2 100%   Breastfeeding No   BMI 43.71 kg/m   Physical Exam Vitals signs and nursing note reviewed.  Constitutional:      General: She is not in acute distress.    Appearance: She is well-developed. She is not ill-appearing, toxic-appearing or diaphoretic.  HENT:     Head: Normocephalic and atraumatic.     Right Ear: Tympanic membrane, ear canal and external ear normal. There is no impacted cerumen.     Left Ear: Tympanic membrane, ear canal and external ear normal. There is no impacted cerumen.     Nose: Congestion and rhinorrhea present.     Mouth/Throat:     Lips: Pink.     Pharynx: Oropharynx is clear. Uvula midline.     Tonsils: No tonsillar exudate or tonsillar abscesses. Swelling: 0 on the right. 0 on the left.      Comments: Posterior oropharynx clear.  Uvula midline without deviation.  No pharyngeal lesions.  Mucous membranes  mildly dry.  Tonsils without edema or exudate.  Tenderness to palpation over tooth #18.  No gingival edema or erythema.  Patient with multiple missing teeth and dental caries.  No evidence of periapical abscess. Eyes:     Pupils: Pupils are equal, round, and reactive to light.  Neck:     Musculoskeletal: Normal range of motion.     Comments: No neck stiffness or neck rigidity. Cardiovascular:     Rate and Rhythm: Tachycardia present.     Pulses: Normal pulses.     Heart sounds: Normal heart sounds. No murmur. No friction rub. No gallop.   Pulmonary:     Effort: No respiratory distress.     Comments: Clear to auscultation bilaterally without wheeze, rhonchi or rales.  No accessory muscle usage.  Able to speak in full sentences without difficulty. Abdominal:     General: There is no distension.     Comments: Soft, nontender without rebound or guarding.  Musculoskeletal: Normal range of motion.     Comments: Moves all extremities without difficulty.  Ambulates in department that difficulty.  Skin:    General: Skin is warm and dry.     Comments: No rashes or lesions.  Neurological:     Mental Status: She is alert.      ED Treatments / Results  Labs (all labs ordered are listed, but only abnormal results are displayed) Labs Reviewed - No data to display  EKG None  Radiology Dg Chest 2 View  Result Date: 06/06/2018 CLINICAL DATA:  Generalized body ache and cough. EXAM: CHEST - 2 VIEW COMPARISON:  April 13, 2010 FINDINGS: The heart size and mediastinal contours are within normal limits. Both lungs are clear. The visualized skeletal structures are unremarkable. IMPRESSION: No active cardiopulmonary disease. Electronically Signed   By: Sherian ReinWei-Chen  Lin M.D.   On: 06/06/2018 13:42    Procedures Procedures (including critical care time)  Medications Ordered in ED Medications  acetaminophen (TYLENOL) tablet 650  mg (650 mg Oral Given 06/06/18 1312)  sodium chloride 0.9 % bolus 1,000  mL (0 mLs Intravenous Stopped 06/06/18 1533)  ketorolac (TORADOL) 15 MG/ML injection 15 mg (15 mg Intravenous Given 06/06/18 1530)     Initial Impression / Assessment and Plan / ED Course  I have reviewed the triage vital signs and the nursing notes.  Pertinent labs & imaging results that were available during my care of the patient were reviewed by me and considered in my medical decision making (see chart for details).  33 year old female presents for evaluation multiple complaints.  Patient with flulike symptoms with known flu exposure in patients son.  Did not obtain influenza vaccine.  Patient also with left-sided dental pain.  No evidence of gingival edema or erythema.  Patient with fractured tooth #18.  No evidence of drainable periapical abscess.  No evidence of Ludwig's angina or deep space infection. No facial swelling. Patient is tachycardic and febrile.  Has not taken anything for symptoms.  Will give Tylenol and 1 L of fluids and reevaluate.  Lungs clear to auscultation bilaterally without wheeze, rhonchi or rales.  No neck stiffness or neck rigidity to suggest meningitis. Chest X-ray negative for infiltrates. Patient with symptoms consistent with influenza.  Vitals are stable, low-grade fever.  Tolerating PO's.  Lungs are clear.  Discussed the cost versus benefit of Tamiflu treatment with the patient. Patient given rx for Tamiflu, per their request. Patient will be discharged with instructions to orally hydrate, rest, and use over-the-counter medications such as anti-inflammatories ibuprofen and Aleve for muscle aches and Tylenol for fever.  Low suspicion for dental infection as source of patient's fever.  More likely influenza.  Patient will also be given a cough suppressant. Patient VS improved with fluids. Patient is hemodynamically stable and appropriate for dc home at this time. Discussed return precaution. Patient voiced understanding and is agreeable for follow up.  Low suspicion for  emergent pathology causing patient's symptoms at this time.    Final Clinical Impressions(s) / ED Diagnoses   Final diagnoses:  Influenza-like illness  Pain, dental    ED Discharge Orders         Ordered    oseltamivir (TAMIFLU) 75 MG capsule  Every 12 hours     06/06/18 1611    guaifenesin (ROBITUSSIN) 100 MG/5ML syrup  Every 4 hours PRN     06/06/18 1611           Sujey Gundry A, PA-C 06/06/18 1620    Charlynne Pander, MD 06/07/18 [id number]

## 2018-06-06 NOTE — Discharge Instructions (Signed)
Evaluated today for flulike symptoms.  I have prescribed you Tamiflu.  Please take as prescribed.  Follow-up with PCP for reevaluation if you continue have symptoms beyond 2 days.  Return to the ED for new or worsening symptoms.

## 2018-06-06 NOTE — ED Triage Notes (Signed)
Patient is AOx4 and ambulatory. Patient arrived via POV. Patient chief complaint is Generalized body aches, chills, nausea, cough, weakness and fever. Patient recently found out Sunday night that patient son had flu.

## 2018-07-01 IMAGING — US US OB TRANSVAGINAL
1 series · 15 of 28 positions shown · non-contrast
Comparison: None.

CLINICAL DATA: 31-year-old pregnant female with pelvic pain and
vaginal bleeding.

EXAM:
TRANSVAGINAL OB ULTRASOUND
TECHNIQUE: Transvaginal ultrasound was performed for complete evaluation of the
gestation as well as the maternal uterus, adnexal regions, and
pelvic cul-de-sac.

[Series 1: us ob transvaginal · 15 of 40 slices shown]
[im 1/40]
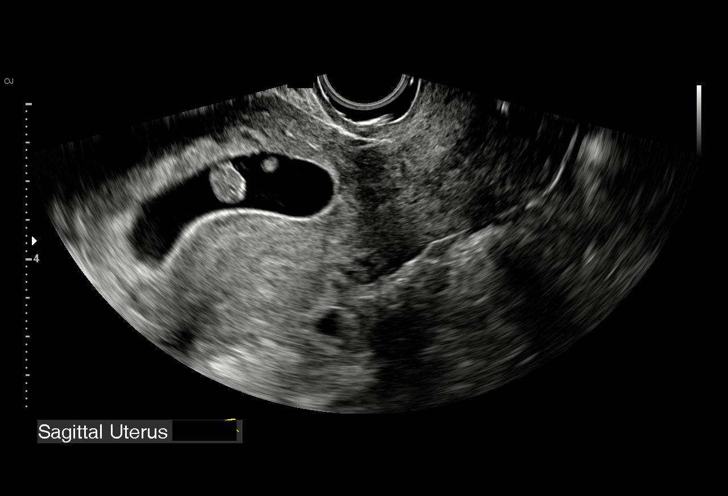
[im 3/40]
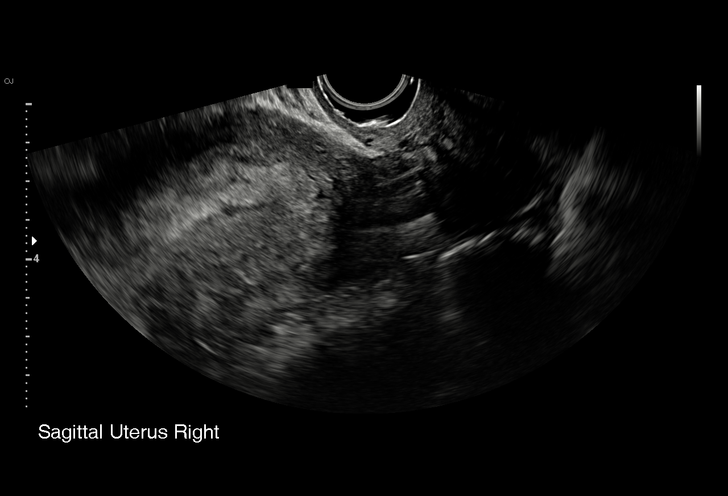
[im 6/40]
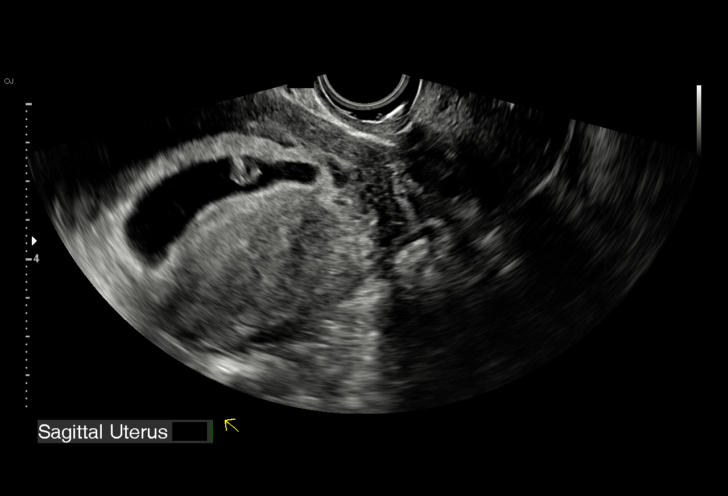
[im 9/40]
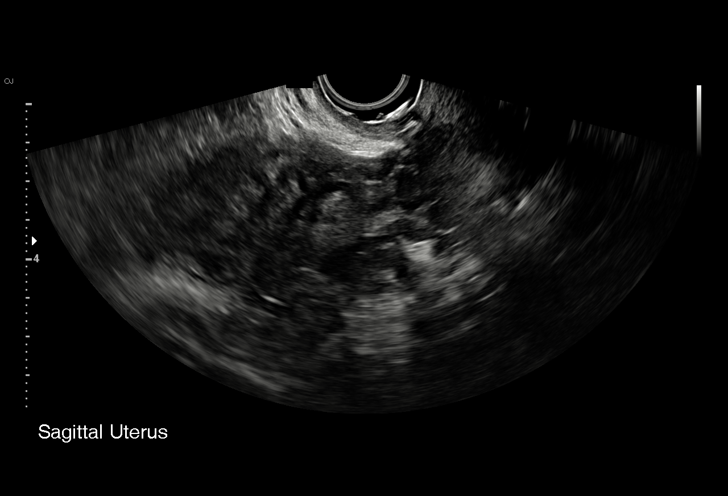
[im 12/40]
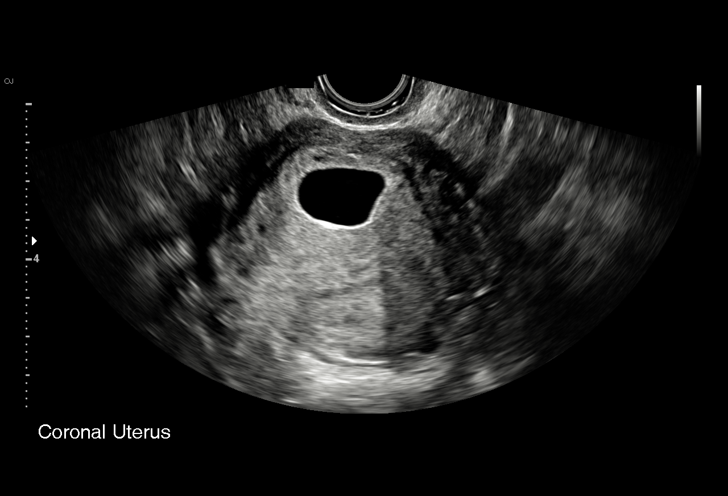
[im 15/40]
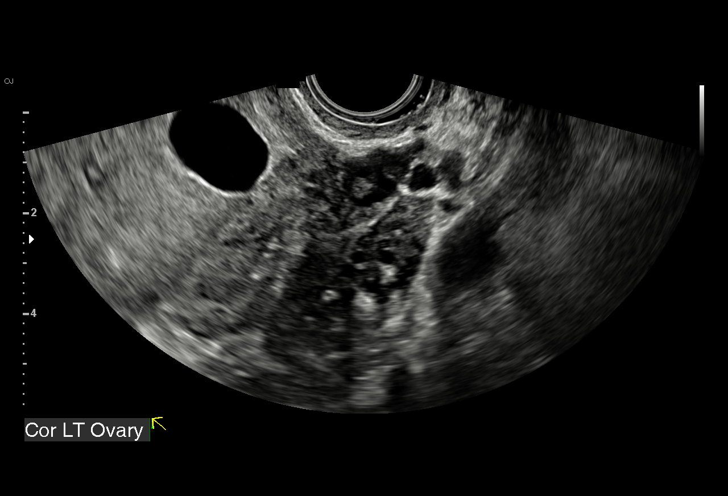
[im 18/40]
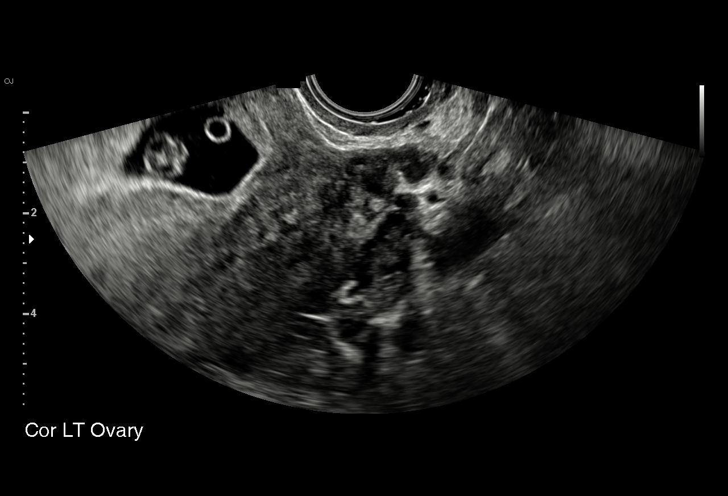
[im 21/40]
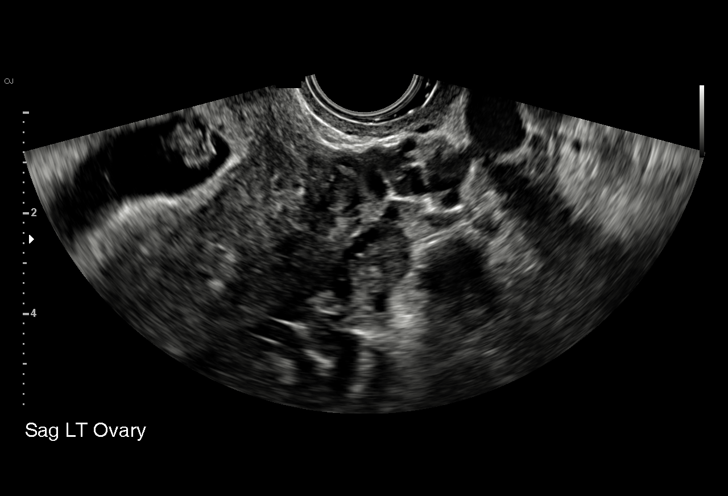
[im 22/40]
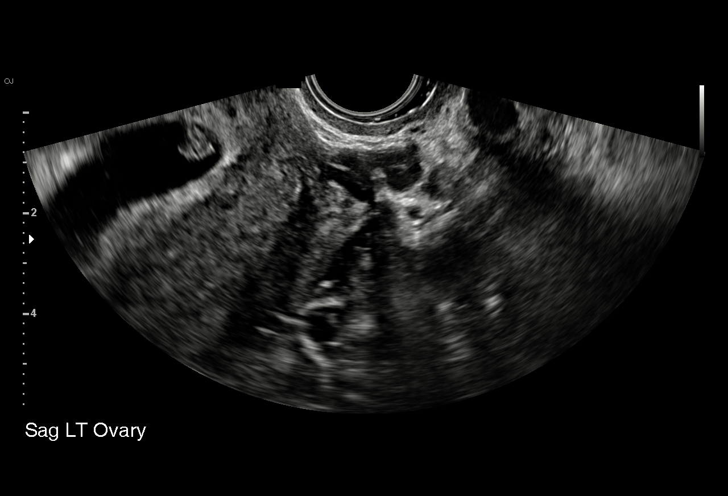
[im 25/40]
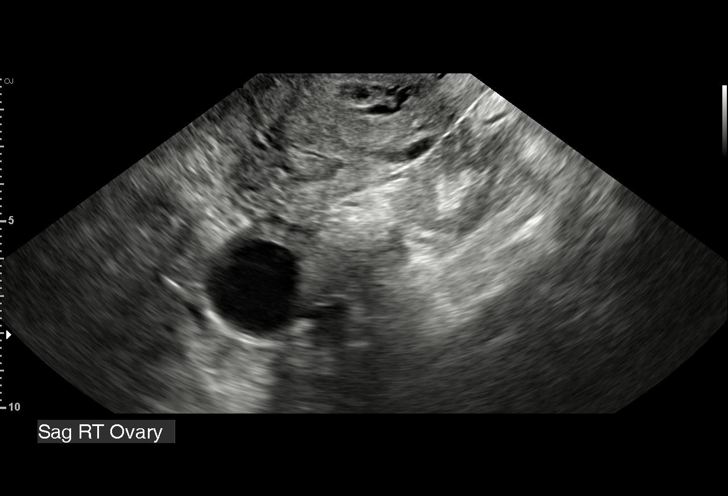
[im 28/40]
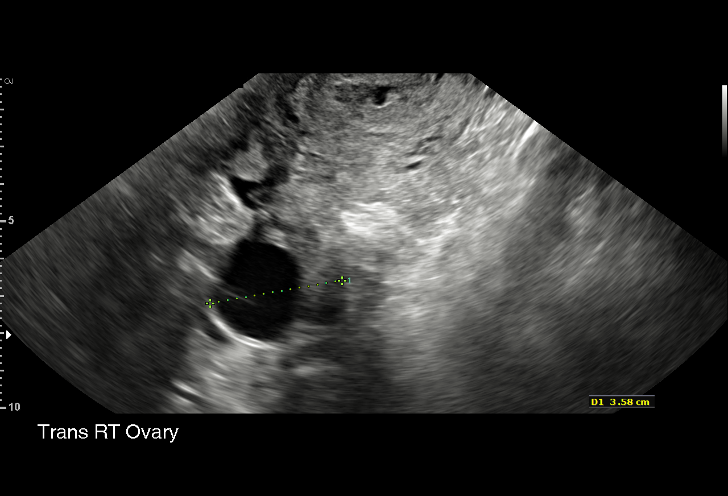
[im 31/40]
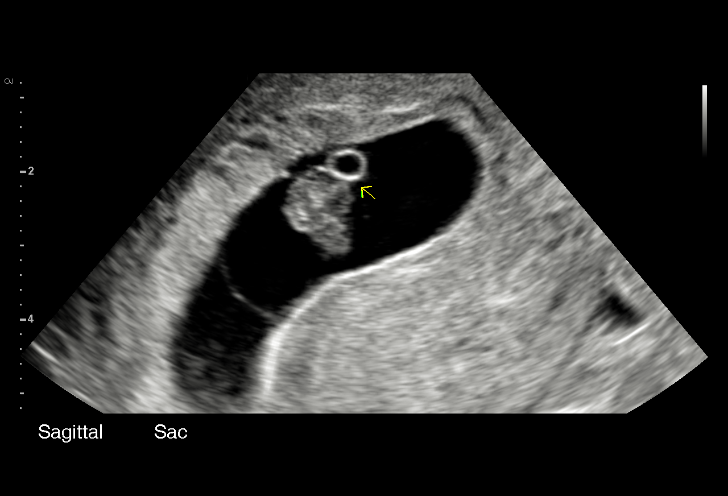
[im 34/40]
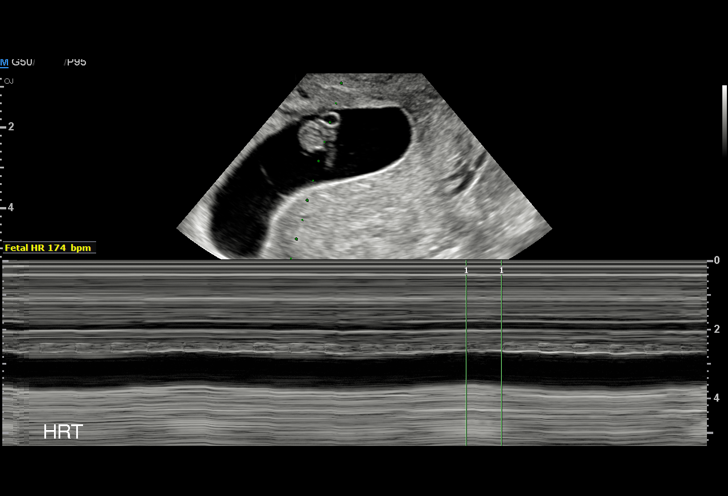
[im 37/40]
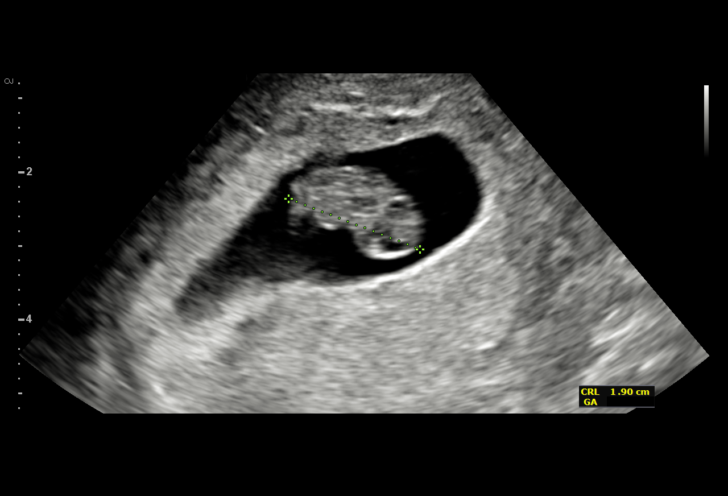
[im 40/40]
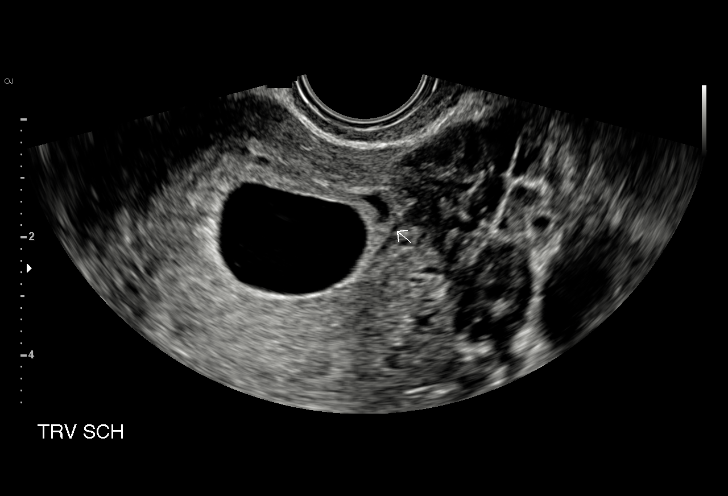

[15 of 28 positions shown; findings below may reference images not displayed]

FINDINGS: Intrauterine gestational sac: Single

Yolk sac:  Visualized.

Embryo:  Visualized.

Cardiac Activity: Visualized.

Heart Rate: 174 bpm

CRL:   18.9 mm   8 w 2 d                  US EDC: 04/11/2018

Subchorionic hemorrhage:  Small

Maternal uterus/adnexae: The ovaries are unremarkable. No adnexal
mass or free fluid.
IMPRESSION: Single living intrauterine gestation with estimated gestational age
of 8 weeks 2 days by this ultrasound.

Small subchorionic hemorrhage.

## 2018-07-10 ENCOUNTER — Inpatient Hospital Stay: Payer: Medicaid Other | Admitting: Family Medicine

## 2018-09-11 ENCOUNTER — Emergency Department (HOSPITAL_COMMUNITY)
Admission: EM | Admit: 2018-09-11 | Discharge: 2018-09-11 | Disposition: A | Discharge status: Home / Self Care | Payer: Medicaid Other | Attending: Emergency Medicine | Admitting: Emergency Medicine

## 2018-09-11 ENCOUNTER — Other Ambulatory Visit: Payer: Self-pay

## 2018-09-11 ENCOUNTER — Encounter (HOSPITAL_COMMUNITY): Payer: Self-pay | Admitting: Emergency Medicine

## 2018-09-11 DIAGNOSIS — Y990 Civilian activity done for income or pay: Secondary | ICD-10-CM | POA: Insufficient documentation

## 2018-09-11 DIAGNOSIS — S39012A Strain of muscle, fascia and tendon of lower back, initial encounter: Secondary | ICD-10-CM | POA: Insufficient documentation

## 2018-09-11 DIAGNOSIS — Y939 Activity, unspecified: Secondary | ICD-10-CM | POA: Insufficient documentation

## 2018-09-11 DIAGNOSIS — Y92129 Unspecified place in nursing home as the place of occurrence of the external cause: Secondary | ICD-10-CM | POA: Insufficient documentation

## 2018-09-11 DIAGNOSIS — Z91040 Latex allergy status: Secondary | ICD-10-CM | POA: Insufficient documentation

## 2018-09-11 DIAGNOSIS — Z7982 Long term (current) use of aspirin: Secondary | ICD-10-CM | POA: Insufficient documentation

## 2018-09-11 DIAGNOSIS — X500XXA Overexertion from strenuous movement or load, initial encounter: Secondary | ICD-10-CM | POA: Insufficient documentation

## 2018-09-11 DIAGNOSIS — T148XXA Other injury of unspecified body region, initial encounter: Secondary | ICD-10-CM

## 2018-09-11 DIAGNOSIS — Z79899 Other long term (current) drug therapy: Secondary | ICD-10-CM | POA: Insufficient documentation

## 2018-09-11 LAB — CBC WITH DIFFERENTIAL/PLATELET
Abs Immature Granulocytes: 0.02 10*3/uL (ref 0.00–0.07)
Basophils Absolute: 0.1 10*3/uL (ref 0.0–0.1)
Basophils Relative: 1 %
Eosinophils Absolute: 0.6 10*3/uL — ABNORMAL HIGH (ref 0.0–0.5)
Eosinophils Relative: 6 %
HCT: 36.5 % (ref 36.0–46.0)
Hemoglobin: 11.2 g/dL — ABNORMAL LOW (ref 12.0–15.0)
Immature Granulocytes: 0 %
Lymphocytes Relative: 41 %
Lymphs Abs: 4.1 10*3/uL — ABNORMAL HIGH (ref 0.7–4.0)
MCH: 24.9 pg — ABNORMAL LOW (ref 26.0–34.0)
MCHC: 30.7 g/dL (ref 30.0–36.0)
MCV: 81.3 fL (ref 80.0–100.0)
Monocytes Absolute: 0.6 10*3/uL (ref 0.1–1.0)
Monocytes Relative: 6 %
Neutro Abs: 4.7 10*3/uL (ref 1.7–7.7)
Neutrophils Relative %: 46 %
Platelets: 345 10*3/uL (ref 150–400)
RBC: 4.49 MIL/uL (ref 3.87–5.11)
RDW: 14.6 % (ref 11.5–15.5)
WBC: 10.1 10*3/uL (ref 4.0–10.5)
nRBC: 0 % (ref 0.0–0.2)

## 2018-09-11 LAB — I-STAT BETA HCG BLOOD, ED (MC, WL, AP ONLY): I-stat hCG, quantitative: <5 m[IU]/mL (ref <5)

## 2018-09-11 LAB — COMPREHENSIVE METABOLIC PANEL
ALT: 15 U/L (ref 0–44)
AST: 16 U/L (ref 15–41)
Albumin: 3.2 g/dL — ABNORMAL LOW (ref 3.5–5.0)
Alkaline Phosphatase: 77 U/L (ref 38–126)
Anion gap: 6 (ref 5–15)
BUN: 14 mg/dL (ref 6–20)
CO2: 26 mmol/L (ref 22–32)
Calcium: 8.9 mg/dL (ref 8.9–10.3)
Chloride: 104 mmol/L (ref 98–111)
Creatinine, Ser: 0.86 mg/dL (ref 0.44–1.00)
GFR calc Af Amer: >60 mL/min (ref >60)
GFR calc non Af Amer: >60 mL/min (ref >60)
Glucose, Bld: 92 mg/dL (ref 70–99)
Potassium: 4.2 mmol/L (ref 3.5–5.1)
Sodium: 136 mmol/L (ref 135–145)
Total Bilirubin: 0.3 mg/dL (ref 0.3–1.2)
Total Protein: 7 g/dL (ref 6.5–8.1)

## 2018-09-11 MED ORDER — OXYCODONE HCL 5 MG PO TABS
5.0000 mg | ORAL_TABLET | Freq: Once | ORAL | Status: AC
  Administered 2018-09-11: 5 mg via ORAL
  Filled 2018-09-11: qty 1

## 2018-09-11 MED ORDER — KETOROLAC TROMETHAMINE 60 MG/2ML IM SOLN
15.0000 mg | Freq: Once | INTRAMUSCULAR | Status: AC
  Administered 2018-09-11: 15 mg via INTRAMUSCULAR
  Filled 2018-09-11: qty 2

## 2018-09-11 MED ORDER — ACETAMINOPHEN 500 MG PO TABS
1000.0000 mg | ORAL_TABLET | Freq: Once | ORAL | Status: AC
  Administered 2018-09-11: 1000 mg via ORAL
  Filled 2018-09-11: qty 2

## 2018-09-11 MED ORDER — DIAZEPAM 5 MG PO TABS
5.0000 mg | ORAL_TABLET | Freq: Once | ORAL | Status: AC
  Administered 2018-09-11: 5 mg via ORAL
  Filled 2018-09-11: qty 1

## 2018-09-11 NOTE — Discharge Instructions (Addendum)
You are not pregnant.  Your labwork otherwise was not concerning.   Your back pain is most likely due to a muscular strain.  There is been a lot of research on back pain, unfortunately the only thing that seems to really help is Tylenol and ibuprofen.  Relative rest is also important to not lift greater than 10 pounds bending or twisting at the waist.  Please follow-up with your family physician.  The other thing that really seems to benefit patients is physical therapy which your doctor may send you for.  Please return to the emergency department for new numbness or weakness to your arms or legs. Difficulty with urinating or urinating or pooping on yourself.  Also if you cannot feel toilet paper when you wipe or get a fever.   Take 4 over the counter ibuprofen tablets 3 times a day or 2 over-the-counter naproxen tablets twice a day for pain. Also take tylenol 1000mg (2 extra strength) four times a day.

## 2018-09-11 NOTE — ED Provider Notes (Signed)
[facility] EMERGENCY DEPARTMENT Provider Note   CSN: [id number] Arrival date & time: 09/11/18  4696    History   Chief Complaint Chief Complaint  Patient presents with  . Back Pain    HPI Laurie Reed is a 33 y.o. female.     33 yo F with a chief complaint of left-sided low back pain.  This been going on for a couple weeks.  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  Worse with bending twisting palpation.  She denies trauma denies fever denies recent surgery or procedure to her back.  She did have a C-section about 6 months ago.  She thinks is unlikely that she is pregnant.  Most recent menstrual cycle was about a week ago.  Denies any urinary symptoms.  She has had chronic paresthesias that come and go on the left side but none recently.  Denies loss of bowel or bladder denies loss of peritoneal sensation.  The history is provided by the patient.  Back Pain  Location:  Generalized Quality:  Cramping and aching Radiates to:  Does not radiate Pain severity:  Moderate Pain is:  Same all the time Onset quality:  Gradual Duration:  2 weeks Timing:  Constant Progression:  Worsening Chronicity:  New Context: lifting heavy objects   Relieved by:  Nothing Worsened by:  Movement and standing Ineffective treatments:  None tried Associated symptoms: no chest pain, no dysuria, no fever and no headaches     Past Medical History:  Diagnosis Date  . Acid reflux 2004  . Anemia   . Anxiety 2008  . Asthma 1993   no previous intubations or hospitalizations   . Chlamydia   . Depression 2008   no meds, currenly ok  . Environmental allergies   . Migraines   . Miscarriage   . Ovarian cyst   . Pre-diabetes   . Ulcer of the stomach and intestine 2004  . Urinary tract infection     Patient Active Problem List   Diagnosis Date Noted  . Anemia in pregnancy 01/23/2018  . Morbid obesity with BMI of  45.0-49.9, adult (HCC) 10/19/2017  . Seasonal allergic rhinitis 11/01/2016  . Asthma 06/30/2015  . Vitamin D deficiency 01/22/2015  . Anxiety and depression 01/21/2015    Past Surgical History:  Procedure Laterality Date  . COLONOSCOPY  2004   . IRRIGATION AND DEBRIDEMENT SEBACEOUS CYST N/A 06/01/2017   Procedure: EXCISION SEBACEOUS CYST ON SCALP;  Surgeon: Ancil Linsey, MD;  Location: [facility] ORS;  Service: General;  Laterality: N/A;  . MULTIPLE TOOTH EXTRACTIONS  2015   6 teeth removed   . TUBAL LIGATION N/A 04/04/2018   Procedure: POST PARTUM TUBAL LIGATION;  Surgeon: Levie Heritage, DO;  Location: [facility];  Service: Gynecology;  Laterality: N/A;  . WISDOM TOOTH EXTRACTION  2007     OB History    Gravida  7   Para  4   Term  4   Preterm  0   AB  3   Living  4     SAB  3   TAB  0   Ectopic  0   Multiple  0   Live Births  4            Home Medications    Prior to Admission medications   Medication Sig Start Date End Date Taking? Authorizing Provider  cyclobenzaprine (FLEXERIL) 10  MG tablet Take 1 tablet (10 mg total) by mouth every 8 (eight) hours as needed (back pain). Patient taking differently: Take 10 mg by mouth daily as needed (back pain).  02/07/18  Yes Donette Larry, CNM  diphenhydrAMINE (BENADRYL) 25 mg capsule Take 25-50 mg by mouth 2 (two) times daily as needed for allergies.   Yes [provider]  fexofenadine (ALLEGRA) 180 MG tablet Take 180 mg by mouth daily.   Yes [provider]  fluticasone (FLONASE) 50 MCG/ACT nasal spray Place 1 spray into both nostrils daily.   Yes [provider]  ibuprofen (ADVIL,MOTRIN) 600 MG tablet Take 1 tablet (600 mg total) by mouth every 6 (six) hours. 04/06/18  Yes Shirley, [place], DO  aspirin 81 MG chewable tablet Chew 1 tablet (81 mg total) by mouth daily. Patient not taking: Reported on 03/20/2018 11/09/17   Judeth Horn, NP  cetirizine (ZYRTEC) 10 MG tablet Take  1 tablet (10 mg total) by mouth daily. Patient not taking: Reported on 09/11/2018 12/07/17   Judeth Horn, NP  famotidine (PEPCID) 20 MG tablet Take 1 tablet (20 mg total) by mouth 2 (two) times daily. Patient not taking: Reported on 06/06/2018 11/14/17   Levie Heritage, DO  ferrous sulfate 325 (65 FE) MG tablet Take 1 tablet (325 mg total) by mouth 2 (two) times daily with a meal. Patient not taking: Reported on 09/11/2018 02/03/18   Arvilla Market, DO  guaifenesin (ROBITUSSIN) 100 MG/5ML syrup Take 5-10 mLs (100-200 mg total) by mouth every 4 (four) hours as needed for cough. Patient not taking: Reported on 09/11/2018 06/06/18   Henderly, Britni A, PA-C  oseltamivir (TAMIFLU) 75 MG capsule Take 1 capsule (75 mg total) by mouth every 12 (twelve) hours. Patient not taking: Reported on 09/11/2018 06/06/18   Henderly, Britni A, PA-C  oxyCODONE (OXY IR/ROXICODONE) 5 MG immediate release tablet Take 1 tablet (5 mg total) by mouth every 4 (four) hours as needed (pain scale 4-7). Patient not taking: Reported on 06/06/2018 04/06/18   Shirley, [place], DO  Prenatal Multivit-Min-Fe-FA (PRENATAL VITAMINS) 0.8 MG tablet Take 1 tablet by mouth daily. Patient not taking: Reported on 06/06/2018 06/12/17   Sharen Counter A, CNM  senna-docusate (SENOKOT-S) 8.6-50 MG tablet Take 2 tablets by mouth daily. Patient not taking: Reported on 09/11/2018 04/07/18   Shirley, [place], DO    Family History Family History  Problem Relation Age of Onset  . Asthma Mother   . Heart disease Mother   . Hypertension Mother   . Asthma Maternal Aunt   . Hypertension Maternal Aunt   . Diabetes Maternal Aunt   . Asthma Maternal Uncle   . Hypertension Maternal Uncle   . Diabetes Maternal Uncle   . Heart disease Maternal Grandmother   . Diabetes Maternal Grandmother   . Hypertension Maternal Grandmother   . Heart disease Maternal Grandfather   . Diabetes Maternal Grandfather   . Hypertension Maternal Grandfather   .  Hypertension Paternal Grandmother   . Hypotension Neg Hx   . Anesthesia problems Neg Hx   . Malignant hyperthermia Neg Hx   . Pseudochol deficiency Neg Hx   . Cancer Neg Hx     Social History Social History   Tobacco Use  . Smoking status: Never Smoker  . Smokeless tobacco: Never Used  Substance Use Topics  . Alcohol use: No    Alcohol/week: 0.0 standard drinks  . Drug use: No     Allergies   Other; Latex; and Zofran  Review of Systems Review of Systems  Constitutional: Negative for chills and fever.  HENT: Negative for congestion and rhinorrhea.   Eyes: Negative for redness and visual disturbance.  Respiratory: Negative for shortness of breath and wheezing.   Cardiovascular: Negative for chest pain and palpitations.  Gastrointestinal: Negative for nausea and vomiting.  Genitourinary: Negative for dysuria and urgency.  Musculoskeletal: Positive for arthralgias and back pain. Negative for myalgias.  Skin: Negative for pallor and wound.  Neurological: Negative for dizziness and headaches.     Physical Exam Updated Vital Signs BP (!) 146/105 (BP Location: Right Arm)   Pulse (!) 122   Temp 97.7 F (36.5 C) (Oral)   SpO2 100%   Physical Exam Vitals signs and nursing note reviewed.  Constitutional:      General: She is not in acute distress.    Appearance: She is well-developed. She is not diaphoretic.  HENT:     Head: Normocephalic and atraumatic.  Eyes:     Pupils: Pupils are equal, round, and reactive to light.  Neck:     Musculoskeletal: Normal range of motion and neck supple.  Cardiovascular:     Rate and Rhythm: Normal rate and regular rhythm.     Heart sounds: No murmur. No friction rub. No gallop.   Pulmonary:     Effort: Pulmonary effort is normal.     Breath sounds: No wheezing or rales.  Abdominal:     General: There is no distension.     Palpations: Abdomen is soft.     Tenderness: There is no abdominal tenderness.  Musculoskeletal:         General: Tenderness present.     Comments: Tenderness on palpation to the left-sided paraspinal musculature from about T8 down to L4.  Reflexes are 2+ and equal bilaterally there is no clonus pulse motor and sensation is intact distally.  Skin:    General: Skin is warm and dry.  Neurological:     Mental Status: She is alert and oriented to person, place, and time.  Psychiatric:        Behavior: Behavior normal.      ED Treatments / Results  Labs (all labs ordered are listed, but only abnormal results are displayed) Labs Reviewed  COMPREHENSIVE METABOLIC PANEL - Abnormal; Notable for the following components:      Result Value   Albumin 3.2 (*)    All other components within normal limits  CBC WITH DIFFERENTIAL/PLATELET - Abnormal; Notable for the following components:   Hemoglobin 11.2 (*)    MCH 24.9 (*)    Lymphs Abs 4.1 (*)    Eosinophils Absolute 0.6 (*)    All other components within normal limits  I-STAT BETA HCG BLOOD, ED ([facility], [facility], [facility] ONLY)    EKG None  Radiology No results found.  Procedures Procedures (including critical care time)  Medications Ordered in ED Medications  acetaminophen (TYLENOL) tablet 1,000 mg (has no administration in time range)  ketorolac (TORADOL) injection 15 mg (has no administration in time range)  oxyCODONE (Oxy IR/ROXICODONE) immediate release tablet 5 mg (has no administration in time range)  diazepam (VALIUM) tablet 5 mg (has no administration in time range)     Initial Impression / Assessment and Plan / ED Course  I have reviewed the triage vital signs and the nursing notes.  Pertinent labs & imaging results that were available during my care of the patient were reviewed by me and considered in my medical decision making (see chart  for details).        33 yo F with a chief complaint of left-sided low back pain.  This is been going on for a couple weeks.  Reproduced on palpation history consistent with muscular strain with  heavy lifting.  We will have the patient do conservative therapy.  PCP follow-up.  10:22 PM:  I have discussed the diagnosis/risks/treatment options with the patient and believe the pt to be eligible for discharge home to follow-up with PCP. We also discussed returning to the ED immediately if new or worsening sx occur. We discussed the sx which are most concerning (e.g., sudden worsening pain, fever, cauda equina s/sx) that necessitate immediate return. Medications administered to the patient during their visit and any new prescriptions provided to the patient are listed below.  Medications given during this visit Medications  acetaminophen (TYLENOL) tablet 1,000 mg (has no administration in time range)  ketorolac (TORADOL) injection 15 mg (has no administration in time range)  oxyCODONE (Oxy IR/ROXICODONE) immediate release tablet 5 mg (has no administration in time range)  diazepam (VALIUM) tablet 5 mg (has no administration in time range)     The patient appears reasonably screen and/or stabilized for discharge and I doubt any other medical condition or other [facility] requiring further screening, evaluation, or treatment in the ED at this time prior to discharge.    Final Clinical Impressions(s) / ED Diagnoses   Final diagnoses:  Muscle strain    ED Discharge Orders    None       Melene Plan, DO 09/11/18 2222

## 2018-09-11 NOTE — ED Triage Notes (Signed)
Pt presents to Ed c/o sharp back pain and left sided pain. Back pain 9/10 and L sided pain is 8/10. Pt reports taking muscle relaxer and ibuprofen for pain. Hx of tubal ligation 6 months ago.

## 2018-09-27 ENCOUNTER — Ambulatory Visit: Payer: Self-pay | Attending: Family Medicine | Admitting: Family Medicine

## 2018-09-27 ENCOUNTER — Encounter: Payer: Self-pay | Admitting: Family Medicine

## 2018-09-27 ENCOUNTER — Other Ambulatory Visit: Payer: Self-pay

## 2018-09-27 DIAGNOSIS — G43809 Other migraine, not intractable, without status migrainosus: Secondary | ICD-10-CM

## 2018-09-27 MED ORDER — TOPIRAMATE 50 MG PO TABS: 50.0000 mg | ORAL_TABLET | Freq: Two times a day (BID) | ORAL | 3 refills | Status: DC

## 2018-09-27 MED ORDER — SUMATRIPTAN SUCCINATE 50 MG PO TABS: 50.0000 mg | ORAL_TABLET | Freq: Once | ORAL | 1 refills | Status: DC

## 2018-09-27 NOTE — Progress Notes (Signed)
Virtual Visit via Video Note  I connected with Laurie Reed, on 09/27/2018 at 9:55am by video enabled telemedicine device due to the COVID-19 pandemic and verified that I am speaking with the correct person using two identifiers.   Consent: I discussed the limitations, risks, security and privacy concerns of performing an evaluation and management service by telemedicine and the availability of in person appointments. I also discussed with the patient that there may be a patient responsible charge related to this service. The patient expressed understanding and agreed to proceed.   Location of Patient: Home  Location of Provider: Clinic   Persons participating in Telemedicine visit: Catheryne Charm Barges [place]-CMA Dr. Nelwyn Salisbury     History of Present Illness: 33 year old female with a history of anxiety and depression who is seen today with complaints of migraines which she rates as 10/10, worse on waking up with associated photophobia and phonophobia.  She was previously on Topamax a couple of years ago but this was discontinued and migraines have been controlled. Over the last month symptoms have returned and intensified. She denies nausea, vomiting, neck pain.  She was seen at the ED 2 weeks ago for muscle strain and was put out of work till 09/18/2018.  She has not been able to return to work due to her migraines but is requesting a letter of release back to work to return on 10/02/2018. She has no additional concerns today.   Past Medical History:  Diagnosis Date  . Acid reflux 2004  . Anemia   . Anxiety 2008  . Asthma 1993   no previous intubations or hospitalizations   . Chlamydia   . Depression 2008   no meds, currenly ok  . Environmental allergies   . Migraines   . Miscarriage   . Ovarian cyst   . Pre-diabetes   . Ulcer of the stomach and intestine 2004  . Urinary tract infection    Allergies  Allergen Reactions  . Other Itching    "20 different  types of trees"  . Latex Hives and Itching  . Zofran Itching    Current Outpatient Medications on File Prior to Visit  Medication Sig Dispense Refill  . cyclobenzaprine (FLEXERIL) 10 MG tablet Take 1 tablet (10 mg total) by mouth every 8 (eight) hours as needed (back pain). (Patient taking differently: Take 10 mg by mouth daily as needed (back pain). ) 30 tablet 0  . diphenhydrAMINE (BENADRYL) 25 mg capsule Take 25-50 mg by mouth 2 (two) times daily as needed for allergies.    . fluticasone (FLONASE) 50 MCG/ACT nasal spray Place 1 spray into both nostrils daily.    Marland Kitchen ibuprofen (ADVIL,MOTRIN) 600 MG tablet Take 1 tablet (600 mg total) by mouth every 6 (six) hours. 30 tablet 0  . aspirin 81 MG chewable tablet Chew 1 tablet (81 mg total) by mouth daily. (Patient not taking: Reported on 03/20/2018) 30 tablet 4  . cetirizine (ZYRTEC) 10 MG tablet Take 1 tablet (10 mg total) by mouth daily. (Patient not taking: Reported on 09/11/2018) 30 tablet 0  . famotidine (PEPCID) 20 MG tablet Take 1 tablet (20 mg total) by mouth 2 (two) times daily. (Patient not taking: Reported on 06/06/2018) 60 tablet 3  . ferrous sulfate 325 (65 FE) MG tablet Take 1 tablet (325 mg total) by mouth 2 (two) times daily with a meal. (Patient not taking: Reported on 09/11/2018) 60 tablet 3  . fexofenadine (ALLEGRA) 180 MG tablet Take 180 mg by mouth daily.    Marland Kitchen  guaifenesin (ROBITUSSIN) 100 MG/5ML syrup Take 5-10 mLs (100-200 mg total) by mouth every 4 (four) hours as needed for cough. (Patient not taking: Reported on 09/11/2018) 60 mL 0  . oseltamivir (TAMIFLU) 75 MG capsule Take 1 capsule (75 mg total) by mouth every 12 (twelve) hours. (Patient not taking: Reported on 09/11/2018) 10 capsule 0  . oxyCODONE (OXY IR/ROXICODONE) 5 MG immediate release tablet Take 1 tablet (5 mg total) by mouth every 4 (four) hours as needed (pain scale 4-7). (Patient not taking: Reported on 06/06/2018) 10 tablet 0  . Prenatal Multivit-Min-Fe-FA (PRENATAL  VITAMINS) 0.8 MG tablet Take 1 tablet by mouth daily. (Patient not taking: Reported on 06/06/2018) 30 tablet 12  . senna-docusate (SENOKOT-S) 8.6-50 MG tablet Take 2 tablets by mouth daily. (Patient not taking: Reported on 09/11/2018) 30 tablet 0   No current facility-administered medications on file prior to visit.     Observations/Objective: Alert, awake, oriented x3 Not in acute distress  Assessment and Plan: 1. Other migraine without status migrainosus, not intractable Uncontrolled Commence Topamax for prophylaxis and Imitrex for breakthrough - topiramate (TOPAMAX) 50 MG tablet; Take 1 tablet (50 mg total) by mouth 2 (two) times daily.  Dispense: 60 tablet; Refill: 3 - SUMAtriptan (IMITREX) 50 MG tablet; Take 1 tablet (50 mg total) by mouth once for 1 dose. At the onset a migraine.May repeat in 2 hours if headache persists or recurs.  Dispense: 10 tablet; Refill: 1  Provided a letter to return to work as requested   follow Up Instructions: Return in about 3 months (around 12/28/2018).    I discussed the assessment and treatment plan with the patient. The patient was provided an opportunity to ask questions and all were answered. The patient agreed with the plan and demonstrated an understanding of the instructions.   The patient was advised to call back or seek an in-person evaluation if the symptoms worsen or if the condition fails to improve as anticipated.     I provided 15 minutes total of Telehealth time during this encounter including median intraservice time, reviewing previous notes, labs, imaging, medications and explaining diagnosis and management.     Hoy RegisterEnobong Eleuterio Dollar, MD, FAAFP. [facility] [place], [place] [phone]   09/27/2018, 10:07 AM

## 2018-09-27 NOTE — Progress Notes (Signed)
Patient has been called and DOB has been verified. Patient has been screened and transferred to PCP to start phone visit.  Patient has been having real bad migraine. Patient would like to restart Topamax.

## 2018-11-05 ENCOUNTER — Emergency Department (HOSPITAL_COMMUNITY): Payer: Self-pay

## 2018-11-05 ENCOUNTER — Other Ambulatory Visit: Payer: Self-pay

## 2018-11-05 ENCOUNTER — Emergency Department (HOSPITAL_COMMUNITY)
Admission: EM | Admit: 2018-11-05 | Discharge: 2018-11-05 | Disposition: A | Discharge status: Home / Self Care | Payer: Self-pay | Attending: Emergency Medicine | Admitting: Emergency Medicine

## 2018-11-05 DIAGNOSIS — R0789 Other chest pain: Secondary | ICD-10-CM | POA: Insufficient documentation

## 2018-11-05 DIAGNOSIS — Z7982 Long term (current) use of aspirin: Secondary | ICD-10-CM | POA: Insufficient documentation

## 2018-11-05 DIAGNOSIS — J45909 Unspecified asthma, uncomplicated: Secondary | ICD-10-CM | POA: Insufficient documentation

## 2018-11-05 DIAGNOSIS — Z91040 Latex allergy status: Secondary | ICD-10-CM | POA: Insufficient documentation

## 2018-11-05 DIAGNOSIS — Z79899 Other long term (current) drug therapy: Secondary | ICD-10-CM | POA: Insufficient documentation

## 2018-11-05 LAB — BASIC METABOLIC PANEL
Anion gap: 7 (ref 5–15)
BUN: 9 mg/dL (ref 6–20)
CO2: 25 mmol/L (ref 22–32)
Calcium: 8.8 mg/dL — ABNORMAL LOW (ref 8.9–10.3)
Chloride: 105 mmol/L (ref 98–111)
Creatinine, Ser: 0.88 mg/dL (ref 0.44–1.00)
GFR calc Af Amer: >60 mL/min (ref >60)
GFR calc non Af Amer: >60 mL/min (ref >60)
Glucose, Bld: 98 mg/dL (ref 70–99)
Potassium: 3.7 mmol/L (ref 3.5–5.1)
Sodium: 137 mmol/L (ref 135–145)

## 2018-11-05 LAB — CBC
HCT: 36.2 % (ref 36.0–46.0)
Hemoglobin: 11 g/dL — ABNORMAL LOW (ref 12.0–15.0)
MCH: 24.7 pg — ABNORMAL LOW (ref 26.0–34.0)
MCHC: 30.4 g/dL (ref 30.0–36.0)
MCV: 81.2 fL (ref 80.0–100.0)
Platelets: 340 10*3/uL (ref 150–400)
RBC: 4.46 MIL/uL (ref 3.87–5.11)
RDW: 14.7 % (ref 11.5–15.5)
WBC: 8.2 10*3/uL (ref 4.0–10.5)
nRBC: 0 % (ref 0.0–0.2)

## 2018-11-05 LAB — I-STAT BETA HCG BLOOD, ED (MC, WL, AP ONLY): I-stat hCG, quantitative: <5 m[IU]/mL (ref <5)

## 2018-11-05 LAB — TROPONIN I: Troponin I: <0.03 ng/mL (ref <0.03)

## 2018-11-05 MED ORDER — FAMOTIDINE 20 MG PO TABS: 20.0000 mg | ORAL_TABLET | Freq: Two times a day (BID) | ORAL | 0 refills | Status: DC

## 2018-11-05 MED ORDER — SODIUM CHLORIDE 0.9% FLUSH: 3.0000 mL | Freq: Once | INTRAVENOUS | Status: DC

## 2018-11-05 MED ORDER — NAPROXEN 500 MG PO TABS: 500.0000 mg | ORAL_TABLET | Freq: Two times a day (BID) | ORAL | 0 refills | Status: DC

## 2018-11-05 MED ORDER — KETOROLAC TROMETHAMINE 15 MG/ML IJ SOLN
15.0000 mg | Freq: Once | INTRAMUSCULAR | Status: AC
  Administered 2018-11-05: 22:00:00 15 mg via INTRAMUSCULAR
  Filled 2018-11-05: qty 1

## 2018-11-05 NOTE — ED Provider Notes (Signed)
[facility]   CSN: [id number] Arrival date & time: 11/05/18  1834     History   Chief Complaint Chief Complaint  Patient presents with  . Chest Pain    HPI Laurie Reed is a 33 y.o. female.     Patient with history of acid reflux and asthma presents to the emergency department with complaint of chest pain.  Symptoms started about 4 days ago.  Patient states that she has a pain in her mid to left chest with a numb sensation going into her left shoulder and arm at times.  Pain is worse with palpation and movement.  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  She states that recently she has been working every day for long hours due to other workers they are having tested positive for coronavirus.  Patient states that she herself tested negative and has not developed any symptoms.  She denies any fever, shortness of breath or cough.  She denies any vomiting but has had some nausea.  She denies any exertional pain or diaphoresis.  Her pain has been constant for the past 4 days and has not resolved.  Pain is positional and worse when she is lying on her left side and improved when she is lying on her right side.  No relationship to food.  She denies heavy NSAID use, alcohol use.  She denies smoking, high cholesterol, diabetes, hypertension.  She does have a family history of cardiac disease in first-degree family members.  She has not personally had any heart problems except for an "irregular heartbeat".  She is not on any medications for this.  Patient denies risk factors for pulmonary embolism including: unilateral leg swelling, history of DVT/PE/other blood clots, use of exogenous hormones, recent immobilizations, recent surgery, recent travel (>4hr segment), malignancy, hemoptysis.       Past Medical History:  Diagnosis Date  . Acid reflux 2004  . Anemia   . Anxiety 2008  . Asthma 1993   no previous intubations or  hospitalizations   . Chlamydia   . Depression 2008   no meds, currenly ok  . Environmental allergies   . Migraines   . Miscarriage   . Ovarian cyst   . Pre-diabetes   . Ulcer of the stomach and intestine 2004  . Urinary tract infection     Patient Active Problem List   Diagnosis Date Noted  . Anemia in pregnancy 01/23/2018  . Morbid obesity with BMI of 45.0-49.9, adult (HCC) 10/19/2017  . Seasonal allergic rhinitis 11/01/2016  . Asthma 06/30/2015  . Vitamin D deficiency 01/22/2015  . Anxiety and depression 01/21/2015    Past Surgical History:  Procedure Laterality Date  . COLONOSCOPY  2004   . IRRIGATION AND DEBRIDEMENT SEBACEOUS CYST N/A 06/01/2017   Procedure: EXCISION SEBACEOUS CYST ON SCALP;  Surgeon: Ancil Linseyavis, Jason Evan, MD;  Location: [facility] ORS;  Service: General;  Laterality: N/A;  . MULTIPLE TOOTH EXTRACTIONS  2015   6 teeth removed   . TUBAL LIGATION N/A 04/04/2018   Procedure: POST PARTUM TUBAL LIGATION;  Surgeon: Levie HeritageStinson, Jacob J, DO;  Location: [facility];  Service: Gynecology;  Laterality: N/A;  . WISDOM TOOTH EXTRACTION  2007     OB History    Gravida  7   Para  4   Term  4   Preterm  0   AB  3   Living  4  SAB  3   TAB  0   Ectopic  0   Multiple  0   Live Births  4            Home Medications    Prior to Admission medications   Medication Sig Start Date End Date Taking? Authorizing Provider  aspirin 81 MG chewable tablet Chew 1 tablet (81 mg total) by mouth daily. Patient not taking: Reported on 03/20/2018 11/09/17   Jorje Guild, NP  cetirizine (ZYRTEC) 10 MG tablet Take 1 tablet (10 mg total) by mouth daily. Patient not taking: Reported on 09/11/2018 12/07/17   Jorje Guild, NP  cyclobenzaprine (FLEXERIL) 10 MG tablet Take 1 tablet (10 mg total) by mouth every 8 (eight) hours as needed (back pain). Patient taking differently: Take 10 mg by mouth daily as needed (back pain).  02/07/18   Julianne Handler, CNM   diphenhydrAMINE (BENADRYL) 25 mg capsule Take 25-50 mg by mouth 2 (two) times daily as needed for allergies.    [provider]  famotidine (PEPCID) 20 MG tablet Take 1 tablet (20 mg total) by mouth 2 (two) times daily. Patient not taking: Reported on 06/06/2018 11/14/17   Truett Mainland, DO  ferrous sulfate 325 (65 FE) MG tablet Take 1 tablet (325 mg total) by mouth 2 (two) times daily with a meal. Patient not taking: Reported on 09/11/2018 02/03/18   Nicolette Bang, DO  fexofenadine (ALLEGRA) 180 MG tablet Take 180 mg by mouth daily.    [provider]  fluticasone (FLONASE) 50 MCG/ACT nasal spray Place 1 spray into both nostrils daily.    [provider]  guaifenesin (ROBITUSSIN) 100 MG/5ML syrup Take 5-10 mLs (100-200 mg total) by mouth every 4 (four) hours as needed for cough. Patient not taking: Reported on 09/11/2018 06/06/18   Henderly, Britni A, PA-C  ibuprofen (ADVIL,MOTRIN) 600 MG tablet Take 1 tablet (600 mg total) by mouth every 6 (six) hours. 04/06/18   Shirley, [place], DO  oseltamivir (TAMIFLU) 75 MG capsule Take 1 capsule (75 mg total) by mouth every 12 (twelve) hours. Patient not taking: Reported on 09/11/2018 06/06/18   Henderly, Britni A, PA-C  oxyCODONE (OXY IR/ROXICODONE) 5 MG immediate release tablet Take 1 tablet (5 mg total) by mouth every 4 (four) hours as needed (pain scale 4-7). Patient not taking: Reported on 06/06/2018 04/06/18   Shirley, [place], DO  Prenatal Multivit-Min-Fe-FA (PRENATAL VITAMINS) 0.8 MG tablet Take 1 tablet by mouth daily. Patient not taking: Reported on 06/06/2018 06/12/17   Fatima Blank A, CNM  senna-docusate (SENOKOT-S) 8.6-50 MG tablet Take 2 tablets by mouth daily. Patient not taking: Reported on 09/11/2018 04/07/18   Shirley, [place], DO  SUMAtriptan (IMITREX) 50 MG tablet Take 1 tablet (50 mg total) by mouth once for 1 dose. At the onset a migraine.May repeat in 2 hours if headache persists or recurs. 09/27/18  09/27/18  Charlott Rakes, MD  topiramate (TOPAMAX) 50 MG tablet Take 1 tablet (50 mg total) by mouth 2 (two) times daily. 09/27/18   Charlott Rakes, MD    Family History Family History  Problem Relation Age of Onset  . Asthma Mother   . Heart disease Mother   . Hypertension Mother   . Asthma Maternal Aunt   . Hypertension Maternal Aunt   . Diabetes Maternal Aunt   . Asthma Maternal Uncle   . Hypertension Maternal Uncle   . Diabetes Maternal Uncle   . Heart disease Maternal Grandmother   . Diabetes Maternal  Grandmother   . Hypertension Maternal Grandmother   . Heart disease Maternal Grandfather   . Diabetes Maternal Grandfather   . Hypertension Maternal Grandfather   . Hypertension Paternal Grandmother   . Hypotension Neg Hx   . Anesthesia problems Neg Hx   . Malignant hyperthermia Neg Hx   . Pseudochol deficiency Neg Hx   . Cancer Neg Hx     Social History Social History   Tobacco Use  . Smoking status: Never Smoker  . Smokeless tobacco: Never Used  Substance Use Topics  . Alcohol use: No    Alcohol/week: 0.0 standard drinks  . Drug use: No     Allergies   Other, Latex, and Zofran   Review of Systems Review of Systems  Constitutional: Negative for diaphoresis and fever.  Eyes: Negative for redness.  Respiratory: Negative for cough and shortness of breath.   Cardiovascular: Positive for chest pain. Negative for palpitations and leg swelling.  Gastrointestinal: Positive for nausea. Negative for abdominal pain and vomiting.  Genitourinary: Negative for dysuria.  Musculoskeletal: Negative for back pain and neck pain.  Skin: Negative for rash.  Neurological: Positive for light-headedness and numbness. Negative for syncope.  Psychiatric/Behavioral: The patient is not nervous/anxious.      Physical Exam Updated Vital Signs BP 106/78 (BP Location: Right Arm)   Pulse 97   Temp 98.7 F (37.1 C) (Oral)   Resp 16   SpO2 99%   Physical Exam Vitals signs and  nursing note reviewed.  Constitutional:      Appearance: She is well-developed. She is not diaphoretic.  HENT:     Head: Normocephalic and atraumatic.     Mouth/Throat:     Mouth: Mucous membranes are not dry.  Eyes:     Conjunctiva/sclera: Conjunctivae normal.  Neck:     Musculoskeletal: Normal range of motion and neck supple. No muscular tenderness.     Vascular: Normal carotid pulses. No carotid bruit or JVD.     Trachea: Trachea normal. No tracheal deviation.  Cardiovascular:     Rate and Rhythm: Normal rate and regular rhythm.     Pulses: No decreased pulses.          Radial pulses are 2+ on the right side and 2+ on the left side.     Heart sounds: Normal heart sounds, S1 normal and S2 normal. No murmur.  Pulmonary:     Effort: Pulmonary effort is normal. No respiratory distress.     Breath sounds: No wheezing.  Chest:     Chest wall: Tenderness present.     Comments: Patient with tenderness over the mid to lower left sternal border.  Patient winces in pain when you push on this area.  Pain is also reproduced with raising her left arm. Abdominal:     General: Bowel sounds are normal.     Palpations: Abdomen is soft.     Tenderness: There is no abdominal tenderness. There is no guarding or rebound.  Musculoskeletal: Normal range of motion.     Right lower leg: She exhibits no tenderness. No edema.     Left lower leg: She exhibits no tenderness. No edema.  Skin:    General: Skin is warm and dry.     Coloration: Skin is not pale.  Neurological:     Mental Status: She is alert.     Motor: No weakness.     Comments: Patient with normal sensation of her distal extremities.  She moves her arms and legs  with 5 out of 5 strength in the flexors and extensors.  2+ radial pulses bilaterally.      ED Treatments / Results  Labs (all labs ordered are listed, but only abnormal results are displayed) Labs Reviewed  BASIC METABOLIC PANEL - Abnormal; Notable for the following  components:      Result Value   Calcium 8.8 (*)    All other components within normal limits  CBC - Abnormal; Notable for the following components:   Hemoglobin 11.0 (*)    MCH 24.7 (*)    All other components within normal limits  TROPONIN I  I-STAT BETA HCG BLOOD, ED ([facility], [facility], [facility] ONLY)    EKG EKG Interpretation  Date/Time:  Sunday November 05 2018 18:37:15 EDT Ventricular Rate:  97 PR Interval:  142 QRS Duration: 92 QT Interval:  360 QTC Calculation: 457 R Axis:   49 Text Interpretation:  Normal sinus rhythm Normal ECG Confirmed by Virgina Norfolk [phone]) on 11/05/2018 7:20:36 PM   Radiology Dg Chest 2 View  Result Date: 11/05/2018 CLINICAL DATA:  33 year old female with chest pain. EXAM: CHEST - 2 VIEW COMPARISON:  Chest radiograph dated 06/06/2018 FINDINGS: The heart size and mediastinal contours are within normal limits. Both lungs are clear. The visualized skeletal structures are unremarkable. IMPRESSION: No active cardiopulmonary disease. Electronically Signed   By: Elgie Collard M.D.   On: 11/05/2018 20:07    Procedures Procedures (including critical care time)  Medications Ordered in ED Medications  sodium chloride flush (NS) 0.9 % injection 3 mL (has no administration in time range)     Initial Impression / Assessment and Plan / ED Course  I have reviewed the triage vital signs and the nursing notes.  Pertinent labs & imaging results that were available during my care of the patient were reviewed by me and considered in my medical decision making (see chart for details).        Patient seen and examined. EKG personally reviewed. Non-ischemic.  Awaiting lab work-up, chest x-ray.  Patient's symptoms are suspicious for musculoskeletal chest pain.  Vital signs reviewed and are as follows: BP 106/78 (BP Location: Right Arm)   Pulse 97   Temp 98.7 F (37.1 C) (Oral)   Resp 16   SpO2 99%   9:47 PM patient updated on results.  Lab work reassuring.  Toradol  ordered for pain.  We will discharged home with Pepcid and naproxen.  Encourage PCP follow-up in the next week for recheck.  Discussed need to rest her chest and avoid activities which exacerbate her pain.  Work note given for 2 days.  Patient was counseled to return with severe chest pain, especially if the pain is crushing or pressure-like and spreads to the arms, back, neck, or jaw, or if they have sweating, nausea, or shortness of breath with the pain. They were encouraged to call 911 with these symptoms.   The patient verbalized understanding and agreed.    Final Clinical Impressions(s) / ED Diagnoses   Final diagnoses:  Chest wall pain   Patient with reproducible chest pain for the past 4 days.  Pain has been constant.  Exacerbated by movement and palpation.  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  Suspect that her pain is due to overuse.  Suspect costochondritis as she is tender along the sternal border on the left.  Troponin negative x1.  EKG nonischemic.  Will treat with anti-inflammatories and have  her follow-up as needed.  Return instructions as above.  Chest x-ray is clear without signs of pneumothorax or pneumonia.  ED Discharge Orders         Ordered    naproxen (NAPROSYN) 500 MG tablet  2 times daily     11/05/18 2143    famotidine (PEPCID) 20 MG tablet  2 times daily     11/05/18 2143           Renne CriglerGeiple, Emilianna Barlowe, PA-C 11/05/18 2149    Virgina Norfolkuratolo, Adam, DO 11/06/18 0022

## 2018-11-05 NOTE — ED Triage Notes (Signed)
Pt reports central CP x 4 days. Pt reports worsening pain when laying on her left side. Pt reports nausea and near-syncopal episodes. Pt denies SHOB.

## 2018-11-05 NOTE — Discharge Instructions (Signed)
Please read and follow all provided instructions.  Your diagnoses today include:  1. Chest wall pain     Tests performed today include:  An EKG of your heart  A chest x-ray  Cardiac enzymes - a blood test for heart muscle damage  Blood counts and electrolytes  Vital signs. See below for your results today.   Medications prescribed:   Naproxen - anti-inflammatory pain medication  Do not exceed 500mg  naproxen every 12 hours, take with food  You have been prescribed an anti-inflammatory medication or NSAID. Take with food. Take smallest effective dose for the shortest duration needed for your pain. Stop taking if you experience stomach pain or vomiting.    Pepcid (famotidine) - antihistamine  You can find this medication over-the-counter.   DO NOT exceed:   20mg  Pepcid every 12 hours  Take any prescribed medications only as directed.  Follow-up instructions: Please follow-up with your primary care provider in 1 week for further evaluation of your symptoms.   Return instructions:  SEEK IMMEDIATE MEDICAL ATTENTION IF:  You have severe chest pain, especially if the pain is crushing or pressure-like and spreads to the arms, back, neck, or jaw, or if you have sweating, nausea (feeling sick to your stomach), or shortness of breath. THIS IS AN EMERGENCY. Don't wait to see if the pain will go away. Get medical help at once. Call 911 or [phone] (operator). DO NOT drive yourself to the hospital.   Your chest pain gets worse and does not go away with rest.   You have an attack of chest pain lasting longer than usual, despite rest and treatment with the medications your caregiver has prescribed.   You wake from sleep with chest pain or shortness of breath.  You feel dizzy or faint.  You have chest pain not typical of your usual pain for which you originally saw your caregiver.   You have any other emergent concerns regarding your health.  Additional Information: Chest pain comes  from many different causes. Your caregiver has diagnosed you as having chest pain that is not specific for one problem, but does not require admission.  You are at low risk for an acute heart condition or other serious illness.   Your vital signs today were: BP (!) 102/55    Pulse 82    Temp 98.7 F (37.1 C) (Oral)    Resp 14    SpO2 100%  If your blood pressure (BP) was elevated above 135/85 this visit, please have this repeated by your doctor within one month. --------------

## 2018-11-10 ENCOUNTER — Encounter (HOSPITAL_COMMUNITY): Payer: Self-pay | Admitting: Emergency Medicine

## 2018-11-10 ENCOUNTER — Emergency Department (HOSPITAL_COMMUNITY): Payer: Self-pay

## 2018-11-10 ENCOUNTER — Other Ambulatory Visit: Payer: Self-pay

## 2018-11-10 ENCOUNTER — Emergency Department (HOSPITAL_COMMUNITY)
Admission: EM | Admit: 2018-11-10 | Discharge: 2018-11-11 | Disposition: A | Discharge status: Home / Self Care | Payer: Self-pay | Attending: Emergency Medicine | Admitting: Emergency Medicine

## 2018-11-10 DIAGNOSIS — J45909 Unspecified asthma, uncomplicated: Secondary | ICD-10-CM | POA: Insufficient documentation

## 2018-11-10 DIAGNOSIS — R519 Headache, unspecified: Secondary | ICD-10-CM

## 2018-11-10 DIAGNOSIS — R0789 Other chest pain: Secondary | ICD-10-CM | POA: Insufficient documentation

## 2018-11-10 DIAGNOSIS — R51 Headache: Secondary | ICD-10-CM | POA: Insufficient documentation

## 2018-11-10 LAB — CBC
HCT: 36.5 % (ref 36.0–46.0)
Hemoglobin: 10.9 g/dL — ABNORMAL LOW (ref 12.0–15.0)
MCH: 24.3 pg — ABNORMAL LOW (ref 26.0–34.0)
MCHC: 29.9 g/dL — ABNORMAL LOW (ref 30.0–36.0)
MCV: 81.3 fL (ref 80.0–100.0)
Platelets: 352 10*3/uL (ref 150–400)
RBC: 4.49 MIL/uL (ref 3.87–5.11)
RDW: 14.8 % (ref 11.5–15.5)
WBC: 9.6 10*3/uL (ref 4.0–10.5)
nRBC: 0 % (ref 0.0–0.2)

## 2018-11-10 LAB — BASIC METABOLIC PANEL
Anion gap: 8 (ref 5–15)
BUN: 8 mg/dL (ref 6–20)
CO2: 24 mmol/L (ref 22–32)
Calcium: 8.8 mg/dL — ABNORMAL LOW (ref 8.9–10.3)
Chloride: 105 mmol/L (ref 98–111)
Creatinine, Ser: 0.82 mg/dL (ref 0.44–1.00)
GFR calc Af Amer: >60 mL/min (ref >60)
GFR calc non Af Amer: >60 mL/min (ref >60)
Glucose, Bld: 96 mg/dL (ref 70–99)
Potassium: 3.7 mmol/L (ref 3.5–5.1)
Sodium: 137 mmol/L (ref 135–145)

## 2018-11-10 LAB — I-STAT BETA HCG BLOOD, ED (MC, WL, AP ONLY): I-stat hCG, quantitative: <5 m[IU]/mL (ref <5)

## 2018-11-10 LAB — TROPONIN I (HIGH SENSITIVITY): Troponin I (High Sensitivity): 3 ng/L (ref <18)

## 2018-11-10 MED ORDER — SODIUM CHLORIDE 0.9% FLUSH: 3.0000 mL | Freq: Once | INTRAVENOUS | Status: DC

## 2018-11-10 MED ORDER — KETOROLAC TROMETHAMINE 30 MG/ML IJ SOLN
30.0000 mg | Freq: Once | INTRAMUSCULAR | Status: AC
  Administered 2018-11-10: 23:00:00 30 mg via INTRAVENOUS
  Filled 2018-11-10: qty 1

## 2018-11-10 MED ORDER — METOCLOPRAMIDE HCL 5 MG/ML IJ SOLN
10.0000 mg | Freq: Once | INTRAMUSCULAR | Status: AC
  Administered 2018-11-10: 23:00:00 10 mg via INTRAVENOUS
  Filled 2018-11-10: qty 2

## 2018-11-10 MED ORDER — SODIUM CHLORIDE 0.9 % IV BOLUS
1000.0000 mL | Freq: Once | INTRAVENOUS | Status: AC
  Administered 2018-11-10: 1000 mL via INTRAVENOUS

## 2018-11-10 NOTE — ED Triage Notes (Signed)
Patient reports persistent central chest pain for over a week - seen on Sunday for same and discharged home. She also endorses headache that began today with nausea. Denies shortness of breath. She states she has a history of migraines, but is not currently taking anything for it. Took Tylenol at 1pm today. Resp e/u, skin w/d.

## 2018-11-10 NOTE — ED Provider Notes (Signed)
[facility] EMERGENCY DEPARTMENT Provider Note   CSN: [id number] Arrival date & time: 11/10/18  1827     History   Chief Complaint Chief Complaint  Patient presents with  . Chest Pain  . Migraine    HPI Laurie Reed is a 33 y.o. female.     The history is provided by the patient and medical records.  Chest Pain Associated symptoms: headache   Migraine Associated symptoms include chest pain and headaches.     33 year old female with history of acid reflux, anemia, anxiety, asthma, depression, migraine headaches, presenting to the ED with chest pain and headache.  1.  Chest pain--has been ongoing for the past week.  She was seen here on 11/05/2018 for same with negative work-up.  States she was prescribed Naprosyn but has not noticed any relief.  States her entire chest just feels "sore".  States she often wakes up at night and has trouble sleeping because she is uncomfortable.  She has not had any cough, fever, or other upper respiratory symptoms.  She has not noticed any shortness of breath.  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  She does report she has been working long hours as other coworkers have been out, however she has not been to work since Monday so thought her symptoms should be getting better.  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  2.  Headache--states woke up with headache this morning and has been persistent all day.  Pain across the forehead and on the sides of her head, throbbing in nature.  She reports associated nausea but denies any vomiting.  She denies any dizziness, confusion, numbness, weakness, neck pain, or neck stiffness.  She has history of migraines and has not had any issues until recently.  States her doctor recently restarted 2 of her migraine medications that she was on previously, however was unable to pay for  them at the pharmacy.  She did try taking some Advil today without relief.  Past Medical History:  Diagnosis Date  . Acid reflux 2004  . Anemia   . Anxiety 2008  . Asthma 1993   no previous intubations or hospitalizations   . Chlamydia   . Depression 2008   no meds, currenly ok  . Environmental allergies   . Migraines   . Miscarriage   . Ovarian cyst   . Pre-diabetes   . Ulcer of the stomach and intestine 2004  . Urinary tract infection     Patient Active Problem List   Diagnosis Date Noted  . Anemia in pregnancy 01/23/2018  . Morbid obesity with BMI of 45.0-49.9, adult ([place]) 10/19/2017  . Seasonal allergic rhinitis 11/01/2016  . Asthma 06/30/2015  . Vitamin D deficiency 01/22/2015  . Anxiety and depression 01/21/2015    Past Surgical History:  Procedure Laterality Date  . COLONOSCOPY  2004   . IRRIGATION AND DEBRIDEMENT SEBACEOUS CYST N/A 06/01/2017   Procedure: EXCISION SEBACEOUS CYST ON SCALP;  Surgeon: Vickie Epley, MD;  Location: [facility];  Service: General;  Laterality: N/A;  . MULTIPLE TOOTH EXTRACTIONS  2015   6 teeth removed   . TUBAL LIGATION N/A 04/04/2018   Procedure: POST PARTUM TUBAL LIGATION;  Surgeon: Truett Mainland, DO;  Location: [place] [facility];  Service: Gynecology;  Laterality: N/A;  . [place] EXTRACTION  2007  OB History    Gravida  7   Para  4   Term  4   Preterm  0   AB  3   Living  4     SAB  3   TAB  0   Ectopic  0   Multiple  0   Live Births  4            Home Medications    Prior to Admission medications   Medication Sig Start Date End Date Taking? Authorizing Provider  acetaminophen (TYLENOL) 500 MG tablet Take 500-1,000 mg by mouth every 6 (six) hours as needed for mild pain, moderate pain or headache (or migraines).    Yes [provider]  cyclobenzaprine (FLEXERIL) 10 MG tablet Take 1 tablet (10 mg total) by mouth every 8 (eight) hours as needed (back pain). Patient taking  differently: Take 10 mg by mouth daily as needed (for back pain).  02/07/18  Yes Donette LarryBhambri, Melanie, CNM  diphenhydrAMINE (BENADRYL) 25 mg capsule Take 25-50 mg by mouth 2 (two) times daily as needed for allergies.   Yes [provider]  famotidine (PEPCID) 20 MG tablet Take 1 tablet (20 mg total) by mouth 2 (two) times daily. 11/05/18  Yes Renne CriglerGeiple, Joshua, PA-C  fexofenadine (ALLEGRA) 180 MG tablet Take 180 mg by mouth daily as needed for allergies or rhinitis.    Yes [provider]  fluticasone (FLONASE) 50 MCG/ACT nasal spray Place 1 spray into both nostrils daily.   Yes [provider]  ibuprofen (ADVIL,MOTRIN) 600 MG tablet Take 1 tablet (600 mg total) by mouth every 6 (six) hours. Patient taking differently: Take 600 mg by mouth every 6 (six) hours as needed for headache, mild pain or moderate pain (or migraines).  04/06/18  Yes Talbert ForestShirley, [place], DO  naproxen (NAPROSYN) 500 MG tablet Take 1 tablet (500 mg total) by mouth 2 (two) times daily. 11/05/18  Yes Renne CriglerGeiple, Joshua, PA-C  SUMAtriptan (IMITREX) 50 MG tablet Take 1 tablet (50 mg total) by mouth once for 1 dose. At the onset a migraine.May repeat in 2 hours if headache persists or recurs. Patient taking differently: Take 50 mg by mouth once as needed (at the onset of a migraine- may repeat once in 2 hours if headache persists or recurs).  09/27/18 11/10/18 Yes Hoy RegisterNewlin, Enobong, MD  topiramate (TOPAMAX) 50 MG tablet Take 1 tablet (50 mg total) by mouth 2 (two) times daily. 09/27/18   Hoy RegisterNewlin, Enobong, MD  cetirizine (ZYRTEC) 10 MG tablet Take 1 tablet (10 mg total) by mouth daily. Patient not taking: Reported on 09/11/2018 12/07/17 11/05/18  Judeth HornLawrence, Erin, NP  ferrous sulfate 325 (65 FE) MG tablet Take 1 tablet (325 mg total) by mouth 2 (two) times daily with a meal. Patient not taking: Reported on 09/11/2018 02/03/18 11/05/18  Arvilla MarketWallace, Catherine Lauren, DO    Family History Family History  Problem Relation Age of Onset  .  Asthma Mother   . Heart disease Mother   . Hypertension Mother   . Asthma Maternal Aunt   . Hypertension Maternal Aunt   . Diabetes Maternal Aunt   . Asthma Maternal Uncle   . Hypertension Maternal Uncle   . Diabetes Maternal Uncle   . Heart disease Maternal Grandmother   . Diabetes Maternal Grandmother   . Hypertension Maternal Grandmother   . Heart disease Maternal Grandfather   . Diabetes Maternal Grandfather   . Hypertension Maternal Grandfather   . Hypertension Paternal Grandmother   .  Hypotension Neg Hx   . Anesthesia problems Neg Hx   . Malignant hyperthermia Neg Hx   . Pseudochol deficiency Neg Hx   . Cancer Neg Hx     Social History Social History   Tobacco Use  . Smoking status: Never Smoker  . Smokeless tobacco: Never Used  Substance Use Topics  . Alcohol use: No    Alcohol/week: 0.0 standard drinks  . Drug use: No     Allergies   Other, Latex, Tree extract, and Zofran   Review of Systems Review of Systems  Cardiovascular: Positive for chest pain.  Neurological: Positive for headaches.  All other systems reviewed and are negative.    Physical Exam Updated Vital Signs BP (!) 129/91   Pulse 81   Temp 98.6 F (37 C) (Oral)   Resp (!) 22   SpO2 100%   Physical Exam Vitals signs and nursing note reviewed.  Constitutional:      General: She is not in acute distress.    Appearance: She is well-developed. She is not diaphoretic.  HENT:     Head: Normocephalic and atraumatic.     Right Ear: External ear normal.     Left Ear: External ear normal.  Eyes:     Conjunctiva/sclera: Conjunctivae normal.     Pupils: Pupils are equal, round, and reactive to light.  Neck:     Musculoskeletal: Full passive range of motion without pain, normal range of motion and neck supple. No neck rigidity.     Comments: No rigidity, no meningismus Cardiovascular:     Rate and Rhythm: Normal rate and regular rhythm.     Heart sounds: Normal heart sounds. No murmur.   Pulmonary:     Effort: Pulmonary effort is normal. No respiratory distress.     Breath sounds: Normal breath sounds. No wheezing, rhonchi or rales.  Chest:     Comments: Chest wall is diffusely tender, no deformities Abdominal:     General: Bowel sounds are normal.     Palpations: Abdomen is soft.     Tenderness: There is no abdominal tenderness. There is no guarding.  Musculoskeletal: Normal range of motion.  Skin:    General: Skin is warm and dry.     Findings: No rash.  Neurological:     Mental Status: She is alert and oriented to person, place, and time.     Cranial Nerves: No cranial nerve deficit.     Sensory: No sensory deficit.     Motor: No tremor or seizure activity.     Comments: AAOx3, answering questions and following commands appropriately; equal strength UE and LE bilaterally; CN grossly intact; moves all extremities appropriately without ataxia; no focal neuro deficits or facial asymmetry appreciated  Psychiatric:        Behavior: Behavior normal.        Thought Content: Thought content normal.      ED Treatments / Results  Labs (all labs ordered are listed, but only abnormal results are displayed) Labs Reviewed  BASIC METABOLIC PANEL - Abnormal; Notable for the following components:      Result Value   Calcium 8.8 (*)    All other components within normal limits  CBC - Abnormal; Notable for the following components:   Hemoglobin 10.9 (*)    MCH 24.3 (*)    MCHC 29.9 (*)    All other components within normal limits  TROPONIN I (HIGH SENSITIVITY)  I-STAT BETA HCG BLOOD, ED ([facility], [facility], [facility] ONLY)  EKG    Radiology Dg Chest 2 View  Result Date: 11/10/2018 CLINICAL DATA:  Chest pain for greater than 1 week. EXAM: CHEST - 2 VIEW COMPARISON:  11/05/2018 FINDINGS: The cardiac silhouette is upper limits of normal in size. No airspace consolidation, edema, pleural effusion, pneumothorax is identified. No acute osseous abnormality is seen. IMPRESSION: No active  cardiopulmonary disease. Electronically Signed   By: Sebastian AcheAllen  Grady M.D.   On: 11/10/2018 19:59    Procedures Procedures (including critical care time)  Medications Ordered in ED Medications  sodium chloride flush (NS) 0.9 % injection 3 mL (has no administration in time range)  sodium chloride 0.9 % bolus 1,000 mL (0 mLs Intravenous Stopped 11/11/18 0120)  ketorolac (TORADOL) 30 MG/ML injection 30 mg (30 mg Intravenous Given 11/10/18 2309)  metoCLOPramide (REGLAN) injection 10 mg (10 mg Intravenous Given 11/10/18 2309)     Initial Impression / Assessment and Plan / ED Course  I have reviewed the triage vital signs and the nursing notes.  Pertinent labs & imaging results that were available during my care of the patient were reviewed by me and considered in my medical decision making (see chart for details).  33 year old female presenting to the ED with chest pain and headache.  Patient is afebrile and nontoxic in appearance.  1.  Chest pain--ongoing for 1 week.  Recent evaluation on 11/05/2018 for same with negative evaluation.  States generalized pain.  Pain is reproducible with palpation of the chest wall.  Her EKG here is unchanged from prior with no ischemic changes.  Labs are reassuring including troponin.  Chest x-ray is clear.  Suspect this is chest wall pain.  She has been taking anti-inflammatories, she does have muscle relaxers at home which I suggested she may add on for extra relief.  2.  Headache--states it feels like migraines.  She has a history of the same but has not had any issues until recently her PCP restarted her migraine medications, however unable to fill them due to cost.  Her neurologic exam is nonfocal.  She has no clinical signs or symptoms suggestive of meningitis.  She was treated here with migraine cocktail and headache has resolved.  She is been resting comfortably.  Feel patient is stable for discharge home.  Encouraged to follow-up closely with PCP.  Return here  for any new or acute changes.  Final Clinical Impressions(s) / ED Diagnoses   Final diagnoses:  Chest wall pain  Bad headache    ED Discharge Orders    None       Garlon HatchetSanders, Letecia Arps M, PA-C 11/11/18 0216    Derwood KaplanNanavati, Ankit, MD 11/11/18 1226

## 2018-11-11 NOTE — Discharge Instructions (Signed)
I would take a dose of your muscle relaxer tonight before you go to sleep, continue your anti-inflammatories. Follow-up with your primary care doctor. Return here for any new or acute changes.

## 2018-11-11 NOTE — ED Notes (Signed)
Patient verbalizes understanding of discharge instructions. Opportunity for questioning and answers were provided. Armband removed by staff, pt discharged from ED by wheelchair   

## 2018-11-21 ENCOUNTER — Telehealth: Payer: Self-pay | Admitting: Internal Medicine

## 2018-11-21 NOTE — Telephone Encounter (Signed)
Patient called stating she has been having chest pain and is also experiencing left arm numbness. Please follow up.

## 2018-11-21 NOTE — Telephone Encounter (Signed)
Patients call taken.  Patient identified by name and date of birth.  Patient complaining of left chest wall tightness with numbness to the left arm.  Patient seen in the ED for same twice in the same week with no evidenced of a coronary event.  Normal EKG and labs.  Patient states still has same pain today.    Patient rates pain as a 5/10.  Appointment made to tomorrow.  Patient advised if chest pains become constant, with shortness of breath to go to the ED or urgent care.  Patient acknowledged understanding of advice.

## 2018-11-22 ENCOUNTER — Other Ambulatory Visit: Payer: Self-pay

## 2018-11-22 ENCOUNTER — Encounter: Payer: Self-pay | Admitting: Family Medicine

## 2018-11-22 ENCOUNTER — Ambulatory Visit: Payer: Self-pay | Attending: Family Medicine | Admitting: Family Medicine

## 2018-11-22 VITALS — BP 129/83 | HR 78 | Temp 98.7°F | Ht 62.0 in | Wt 260.4 lb

## 2018-11-22 DIAGNOSIS — D649 Anemia, unspecified: Secondary | ICD-10-CM

## 2018-11-22 DIAGNOSIS — M25512 Pain in left shoulder: Secondary | ICD-10-CM

## 2018-11-22 DIAGNOSIS — Z131 Encounter for screening for diabetes mellitus: Secondary | ICD-10-CM

## 2018-11-22 DIAGNOSIS — G43809 Other migraine, not intractable, without status migrainosus: Secondary | ICD-10-CM

## 2018-11-22 DIAGNOSIS — M7989 Other specified soft tissue disorders: Secondary | ICD-10-CM

## 2018-11-22 DIAGNOSIS — Z09 Encounter for follow-up examination after completed treatment for conditions other than malignant neoplasm: Secondary | ICD-10-CM

## 2018-11-22 DIAGNOSIS — R0789 Other chest pain: Secondary | ICD-10-CM

## 2018-11-22 LAB — POCT GLYCOSYLATED HEMOGLOBIN (HGB A1C): Hemoglobin A1C: 5.7 % — AB (ref 4.0–5.6)

## 2018-11-22 MED ORDER — SUMATRIPTAN SUCCINATE 50 MG PO TABS: 50.0000 mg | ORAL_TABLET | Freq: Once | ORAL | 1 refills | Status: DC | PRN

## 2018-11-22 MED ORDER — TOPIRAMATE 50 MG PO TABS: 50.0000 mg | ORAL_TABLET | Freq: Two times a day (BID) | ORAL | 3 refills | Status: DC

## 2018-11-22 MED ORDER — TRAMADOL HCL 50 MG PO TABS: 50.0000 mg | ORAL_TABLET | Freq: Four times a day (QID) | ORAL | 0 refills | Status: AC | PRN

## 2018-11-22 MED ORDER — FERROUS SULFATE 324 (65 FE) MG PO TBEC: DELAYED_RELEASE_TABLET | ORAL | 3 refills | Status: AC

## 2018-11-22 MED ORDER — PREDNISONE 20 MG PO TABS: ORAL_TABLET | ORAL | 0 refills | Status: DC

## 2018-11-22 NOTE — Progress Notes (Signed)
Established Patient Office Visit  Subjective:  Patient ID: Laurie Reed, female    DOB: [birth year]  Age: 33 y.o. MRN: [id number]  CC:  Chief Complaint  Patient presents with  . Hospitalization Follow-up    HPI Laurie Reed presents for follow-up from ED visits on 11/05/18 and 11/10/18 due to chest pain and left arm pain with numbness. She reports that this is the third week of her mid upper chest pain and pain across her upper right and left chest. She also reports that sometimes when she has pain in her left arm she also feels numbness and tingling in her left fingertips.  She however states that she has had ongoing issues with pain and numbness in both hands.  She reports that she was diagnosed with carpal tunnel syndrome in her right hand in the past but was told that her left hand numbness/tingling was not due to carpal tunnel but was due to nerve damage.       She reports family history of a maternal uncle with heart disease as well as mother with rheumatoid arthritis.  She reports that she is worried about possible heart disease as the cause of her chest pain.  She reports that at the ED at her second visit, she was given a dose of Toradol and she was told that this dose was stronger than the dose she received at her prior visit.  She reports that the dose of Toradol did help with her chest pain for a few days and then wore off.  She reports that her chest feels sore when she touches it.  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  She does not recall any acute instance of onset of pain in the left shoulder/arm.  She does feel as if both of her hands are now weak and she has difficulty opening the tops of jars/medications due to hand weakness and pain.  She also tends to drop objects from her hands without awareness.  She is unable to lift her left arm at the  shoulder above her head and it causes increased pain when she attempts to raise her left arm to the shoulder level.  She denies any issues with nausea or acid reflux at this time.  She has had acid reflux in the past and now takes over-the-counter Tums as needed if this occurs.  When she is having her chest pain, she denies any issues with nausea or feeling sweaty.  No radiation of pain or discomfort into the neck or jaw but she does have pain in the left shoulder/arm.      She reports that occasionally over the past month she will have brief episodes of feeling lightheaded while she is at work.  She feels better if she sits down to rest.  She denies any diagnosis of diabetes but did have gestational diabetes.  She has had anemia in the past.  Past Medical History:  Diagnosis Date  . Acid reflux 2004  . Anemia   . Anxiety 2008  . Asthma 1993   no previous intubations or hospitalizations   . Chlamydia   . Depression 2008   no meds, currenly ok  . Environmental allergies   . Migraines   . Miscarriage   . Ovarian cyst   . Pre-diabetes   . Ulcer of the stomach and intestine 2004  .  Urinary tract infection     Past Surgical History:  Procedure Laterality Date  . COLONOSCOPY  2004   . IRRIGATION AND DEBRIDEMENT SEBACEOUS CYST N/A 06/01/2017   Procedure: EXCISION SEBACEOUS CYST ON SCALP;  Surgeon: Ancil Linseyavis, Jason Evan, MD;  Location: [facility];  Service: General;  Laterality: N/A;  . MULTIPLE TOOTH EXTRACTIONS  2015   6 teeth removed   . TUBAL LIGATION N/A 04/04/2018   Procedure: POST PARTUM TUBAL LIGATION;  Surgeon: Levie HeritageStinson, Jacob J, DO;  Location: [facility];  Service: Gynecology;  Laterality: N/A;  . WISDOM TOOTH EXTRACTION  2007    Family History  Problem Relation Age of Onset  . Asthma Mother   . Heart disease Mother   . Hypertension Mother   . Asthma Maternal Aunt   . Hypertension Maternal Aunt   . Diabetes Maternal Aunt   . Asthma Maternal Uncle   . Hypertension Maternal  Uncle   . Diabetes Maternal Uncle   . Heart disease Maternal Grandmother   . Diabetes Maternal Grandmother   . Hypertension Maternal Grandmother   . Heart disease Maternal Grandfather   . Diabetes Maternal Grandfather   . Hypertension Maternal Grandfather   . Hypertension Paternal Grandmother   . Hypotension Neg Hx   . Anesthesia problems Neg Hx   . Malignant hyperthermia Neg Hx   . Pseudochol deficiency Neg Hx   . Cancer Neg Hx     Social History   Tobacco Use  . Smoking status: Never Smoker  . Smokeless tobacco: Never Used  Substance Use Topics  . Alcohol use: No    Alcohol/week: 0.0 standard drinks  . Drug use: No    Outpatient Medications Prior to Visit  Medication Sig Dispense Refill  . acetaminophen (TYLENOL) 500 MG tablet Take 500-1,000 mg by mouth every 6 (six) hours as needed for mild pain, moderate pain or headache (or migraines).     . cyclobenzaprine (FLEXERIL) 10 MG tablet Take 1 tablet (10 mg total) by mouth every 8 (eight) hours as needed (back pain). (Patient taking differently: Take 10 mg by mouth daily as needed (for back pain). ) 30 tablet 0  . diphenhydrAMINE (BENADRYL) 25 mg capsule Take 25-50 mg by mouth 2 (two) times daily as needed for allergies.    . famotidine (PEPCID) 20 MG tablet Take 1 tablet (20 mg total) by mouth 2 (two) times daily. 20 tablet 0  . fexofenadine (ALLEGRA) 180 MG tablet Take 180 mg by mouth daily as needed for allergies or rhinitis.     . fluticasone (FLONASE) 50 MCG/ACT nasal spray Place 1 spray into both nostrils daily.    Marland Kitchen. ibuprofen (ADVIL,MOTRIN) 600 MG tablet Take 1 tablet (600 mg total) by mouth every 6 (six) hours. (Patient taking differently: Take 600 mg by mouth every 6 (six) hours as needed for headache, mild pain or moderate pain (or migraines). ) 30 tablet 0  . naproxen (NAPROSYN) 500 MG tablet Take 1 tablet (500 mg total) by mouth 2 (two) times daily. 20 tablet 0  . SUMAtriptan (IMITREX) 50 MG tablet Take 1 tablet (50 mg  total) by mouth once for 1 dose. At the onset a migraine.May repeat in 2 hours if headache persists or recurs. (Patient taking differently: Take 50 mg by mouth once as needed (at the onset of a migraine- may repeat once in 2 hours if headache persists or recurs). ) 10 tablet 1  . topiramate (TOPAMAX) 50 MG tablet Take 1 tablet (50 mg total)  by mouth 2 (two) times daily. 60 tablet 3   No facility-administered medications prior to visit.     Allergies  Allergen Reactions  . Other Itching    "20 different types of trees"  . Latex Hives and Itching  . Tree Extract Itching  . Zofran Itching    ROS Review of Systems  Constitutional: Positive for fatigue. Negative for chills and fever.  HENT: Negative for congestion, sore throat and trouble swallowing.   Eyes: Negative for photophobia and visual disturbance.  Respiratory: Positive for chest tightness. Negative for cough and shortness of breath.   Cardiovascular: Negative for chest pain, palpitations and leg swelling.       Occasional sensation of swelling in her feet  Gastrointestinal: Negative for abdominal pain, constipation, diarrhea and nausea.  Endocrine: Negative for cold intolerance, heat intolerance, polydipsia, polyphagia and polyuria.  Genitourinary: Negative for dysuria, flank pain and frequency.  Musculoskeletal: Positive for arthralgias, joint swelling and myalgias. Negative for back pain and gait problem.  Neurological: Positive for light-headedness (has felt light-headed at work). Negative for dizziness and headaches.  Hematological: Negative for adenopathy. Does not bruise/bleed easily.  Psychiatric/Behavioral: Negative for self-injury and suicidal ideas. The patient is nervous/anxious.       Objective:    Physical Exam  Constitutional: She is oriented to person, place, and time. She appears well-developed and well-nourished.  Morbidly obese female in no acute distress initially; patient with some tearfulness during  musculoskeletal exam of the left shoulder  Neck: Normal range of motion. Neck supple.  Cardiovascular: Normal rate and regular rhythm.  Pulmonary/Chest: Effort normal and breath sounds normal. She exhibits tenderness (Patient with chest wall tenderness, bilateral upper chest with increased discomfort with motions that cause stretching of the anterior chest wall/pectoral muscles).  Abdominal: Soft. There is no abdominal tenderness. There is no rebound and no guarding.  Truncal obesity  Musculoskeletal:        General: Tenderness and edema present.     Comments: Positive Tinel-bilat; decreased range of motion at the left shoulder with tenderness to palpation along the anterior and posterior lateral shoulder.  Positive impingement sign/empty can sign at the left shoulder.  Patient with swelling of the left hand and tenderness with palpation over the MCP joints  Lymphadenopathy:    She has no cervical adenopathy.  Neurological: She is alert and oriented to person, place, and time.  Skin: Skin is warm and dry.  Psychiatric: She has a normal mood and affect. Her behavior is normal. Judgment and thought content normal.  Nursing note and vitals reviewed.   BP 129/83 (BP Location: Right Arm, Patient Position: Sitting, Cuff Size: Large)   Pulse 78   Temp 98.7 F (37.1 C) (Oral)   Ht 5\' 2"  (1.575 m)   Wt 260 lb 6.4 oz (118.1 kg)   SpO2 99%   Breastfeeding No   BMI 47.63 kg/m  Wt Readings from Last 3 Encounters:  11/22/18 260 lb 6.4 oz (118.1 kg)  06/06/18 239 lb (108.4 kg)  05/16/18 231 lb 12.8 oz (105.1 kg)       Lab Results  Component Value Date   TSH 0.479 01/21/2015   Lab Results  Component Value Date   WBC 9.6 11/10/2018   HGB 10.9 (L) 11/10/2018   HCT 36.5 11/10/2018   MCV 81.3 11/10/2018   PLT 352 11/10/2018   Lab Results  Component Value Date   NA 137 11/10/2018   K 3.7 11/10/2018   CO2 24 11/10/2018  GLUCOSE 96 11/10/2018   BUN 8 11/10/2018   CREATININE 0.82  11/10/2018   BILITOT 0.3 09/11/2018   ALKPHOS 77 09/11/2018   AST 16 09/11/2018   ALT 15 09/11/2018   PROT 7.0 09/11/2018   ALBUMIN 3.2 (L) 09/11/2018   CALCIUM 8.8 (L) 11/10/2018   ANIONGAP 8 11/10/2018   No results found for: CHOL No results found for: HDL No results found for: LDLCALC No results found for: TRIG No results found for: [facility] Lab Results  Component Value Date   HGBA1C 5.3 01/20/2018      Assessment & Plan:  6. Diabetes mellitus screening We will obtain hemoglobin A1c as patient with prior gestational diabetes and has had some recurrent sensation of lightheadedness. A1c was 5.7 and patient will be notified of the need to make changes in her diet and exercise with goal of weight loss - HgB A1c  1. Anterior chest wall pain; 2. Acute pain of left shoulder I discussed with the patient that based on her examination that her chest pain appears to be musculoskeletal as well as the pain in her left shoulder.  Prescription provided for prednisone taper which should help with both the chest pain and left shoulder pain.  Patient also made aware that she likely has a rotator cuff injury to the left shoulder which is causing her decrease in ability to lift her arm as well as the pain with and without movement.  She is encouraged to apply for the patient assistance program through this office as she is currently uninsured.  Patient would benefit from orthopedic follow-up of her shoulder pain.  Patient will return to clinic in 2 weeks for reevaluation.  Prescription for tramadol provided to take as needed for left shoulder pain and chest wall pain - predniSONE (DELTASONE) 20 MG tablet; Take 2 pills daily for 2 days then 1 pill x 2 days then 1/2 pill x 4 days; take after eating  Dispense: 8 tablet; Refill: 0 - traMADol (ULTRAM) 50 MG tablet; Take 1 tablet (50 mg total) by mouth every 6 (six) hours as needed for up to 5 days for moderate pain.  Dispense: 20 tablet; Refill: 0  4.  Swelling of left hand Patient with swelling in the MCP joints of the left hand as well as generalized swelling.  Patient does have family history significant for rheumatoid arthritis.  Discussed with the patient that I suspect she may have some form of inflammatory arthritis.  Will check CCP antibodies for possible rheumatoid arthritis.  Patient has been placed on prednisone to help with pain and inflammation in the anterior chest wall, left shoulder and bilateral wrist as well as hand swelling. - predniSONE (DELTASONE) 20 MG tablet; Take 2 pills daily for 2 days then 1 pill x 2 days then 1/2 pill x 4 days; take after eating  Dispense: 8 tablet; Refill: 0 - CYCLIC CITRUL PEPTIDE ANTIBODY, IGG/IGA  4. Normocytic anemia Patient with hemoglobin of 10.9 on recent blood work.  She may benefit from over-the-counter iron supplement or multivitamin with iron.  Will send once daily iron supplement to pharmacy if patient wishes to utilize prescription medication  5. Encounter for examination following treatment at hospital Patient's emergency department notes from her visits on 11/05/2018 and 11/10/2018 regarding chest pain were reviewed.  EKG was normal x2.  Emergency department notes indicate that they discussed with patient that her chest pain was likely musculoskeletal.  An After Visit Summary was printed and given to the patient.  Follow-up: Return in about 2 weeks (around 12/06/2018) for chest and shoulder pain.   Cain Saupe, MD

## 2018-11-22 NOTE — Progress Notes (Signed)
Patient went to Urgent care 2 times now 11-10-18 and 11-05-18 due to chest pain. EKG was done 11-13-18. Per pt she may need a referral to a cardiologist.

## 2018-11-24 LAB — CYCLIC CITRUL PEPTIDE ANTIBODY, IGG/IGA: Cyclic Citrullin Peptide Ab: 8 U (ref 0–19)

## 2019-01-02 ENCOUNTER — Other Ambulatory Visit: Payer: Self-pay

## 2019-01-02 ENCOUNTER — Inpatient Hospital Stay (HOSPITAL_COMMUNITY)
Admission: AD | Admit: 2019-01-02 | Discharge: 2019-01-02 | Disposition: A | Discharge status: Home / Self Care | Payer: Self-pay | Attending: Family Medicine | Admitting: Family Medicine

## 2019-01-02 DIAGNOSIS — Z3202 Encounter for pregnancy test, result negative: Secondary | ICD-10-CM | POA: Insufficient documentation

## 2019-01-02 DIAGNOSIS — R109 Unspecified abdominal pain: Secondary | ICD-10-CM | POA: Insufficient documentation

## 2019-01-02 LAB — POCT PREGNANCY, URINE: Preg Test, Ur: NEGATIVE

## 2019-01-02 LAB — URINALYSIS, ROUTINE W REFLEX MICROSCOPIC
Bacteria, UA: NONE SEEN
Bilirubin Urine: NEGATIVE
Glucose, UA: NEGATIVE mg/dL
Ketones, ur: NEGATIVE mg/dL
Leukocytes,Ua: NEGATIVE
Nitrite: NEGATIVE
Protein, ur: NEGATIVE mg/dL
Specific Gravity, Urine: 1.019 (ref 1.005–1.030)
pH: 7 (ref 5.0–8.0)

## 2019-01-02 NOTE — MAU Note (Signed)
Jorje Guild NP in Triage to see pt and discuss plan of care with pt.

## 2019-01-02 NOTE — MAU Provider Note (Signed)
First Provider Initiated Contact with Patient 01/02/19 2140      S Ms. Laurie Reed is a 34 y.o. [phone] non-pregnant female who presents to MAU today with complaint of abdominal pain. Reports epigastric pain that started 3 weeks. Denies chest pain, fever, n/v. Had BTL last year.   O BP 122/77 (BP Location: Right Arm)   Pulse 83   Temp 98.5 F (36.9 C)   Resp 18   Ht 5\' 2"  (1.575 m)   Wt 120.7 kg   LMP 12/18/2018   BMI 48.65 kg/m  Physical Exam  Nursing note and vitals reviewed. Constitutional: She appears well-developed and well-nourished. No distress.  Respiratory: Effort normal. No respiratory distress.  Skin: She is not diaphoretic.  Psychiatric: She has a normal mood and affect. Her behavior is normal. Judgment and thought content normal.    A Non pregnant female Medical screening exam complete Abdominal pain  P Discharge from MAU in stable condition Patient given the option of transfer to [facility] for further evaluation or seek care in outpatient facility of choice List of options for follow-up given  Warning signs for worsening condition that would warrant emergency follow-up discussed Patient may return to MAU as needed for pregnancy related complaints  Jorje Guild, NP 01/02/2019 9:42 PM

## 2019-01-02 NOTE — MAU Note (Signed)
I am having abdominal pains for 2 wks. Pains are sharp. Some lower back pain. Pain is all over abd but more on R. Getting worse. Denies vag bleeding or d/c. Having acid reflux too. No meds taken

## 2019-01-02 NOTE — MAU Note (Signed)
Pt stated she did not want to be taken to [facility]. Pt reports she will go there tomorrow. Pt DC from MAU.

## 2019-02-15 ENCOUNTER — Ambulatory Visit (INDEPENDENT_AMBULATORY_CARE_PROVIDER_SITE_OTHER)
Admission: RE | Admit: 2019-02-15 | Discharge: 2019-02-15 | Disposition: A | Discharge status: Home / Self Care | Payer: Self-pay | Source: Ambulatory Visit

## 2019-02-15 DIAGNOSIS — M545 Low back pain, unspecified: Secondary | ICD-10-CM

## 2019-02-15 MED ORDER — CYCLOBENZAPRINE HCL 5 MG PO TABS: 5.0000 mg | ORAL_TABLET | Freq: Two times a day (BID) | ORAL | 0 refills | Status: AC | PRN

## 2019-02-15 NOTE — Discharge Instructions (Signed)
Take muscle relaxer as needed for severe pain, spasm. May ice, rest, elevate the area(s) of pain.  You may also use hot compresses/warm wash rags to relieve muscle tightness. May use OTC Tylenol, ibuprofen as needed for pain. Return if you develop worsening pain, chest pain, difficulty breathing.

## 2019-02-15 NOTE — ED Provider Notes (Signed)
Virtual Visit via Video Note:  Laurie Reed  initiated request for Telemedicine visit with [facility] Urgent [facility] team. I connected with Laurie Reed  on 02/15/2019 at 8:39 AM  for a synchronized telemedicine visit using a video enabled HIPPA compliant telemedicine application. I verified that I am speaking with Laurie Reed  using two identifiers. Laurie Hall-Potvin, PA-C  was physically located in a [facility] Urgent care site and Laurie Reed was located at a different location.   The limitations of evaluation and management by telemedicine as well as the availability of in-person appointments were discussed. Patient was informed that she  may incur a bill ( including co-pay) for this virtual visit encounter. Laurie Reed  expressed understanding and gave verbal consent to proceed with virtual visit.     History of Present Illness:Laurie Reed  is a 33 y.o. female presents with right-sided low back pain and side pain since yesterday.  Patient states pain is primarily in her back, will sometimes radiate to her side, describes it as tight, sore.  Denies cephalic or caudal radiation, saddle anesthesia, abnormal gait, change in bowel or bladder habit.  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  Patient does perform heavy lifting at work: Though has had the last few days off.  Patient tried taking Tylenol, naproxen without significant relief.  Review of Systems  Constitutional: Negative for fever and malaise/fatigue.  Respiratory: Negative for cough and shortness of breath.   Cardiovascular: Negative for chest pain and palpitations.  Gastrointestinal: Negative for abdominal pain, diarrhea and vomiting.  Musculoskeletal: Positive for back pain. Negative for falls, joint pain, myalgias and neck pain.  Neurological: Negative for tingling and weakness.    Past Medical History:  Diagnosis Date  . Acid reflux 2004   . Anemia   . Anxiety 2008  . Asthma 1993   no previous intubations or hospitalizations   . Chlamydia   . Depression 2008   no meds, currenly ok  . Environmental allergies   . Migraines   . Miscarriage   . Ovarian cyst   . Pre-diabetes   . Ulcer of the stomach and intestine 2004  . Urinary tract infection     Allergies  Allergen Reactions  . Other Itching    "20 different types of trees"  . Latex Hives and Itching  . Tree Extract Itching  . Zofran Itching        Observations/Objective: 33 year old female Sitting in no acute distress.  Patient is able to speak in full sentences without coughing, sneezing, wheezing.  Patient is able to bend over, reports some pain in her back when returning to standing.  Assessment and Plan: 1.  Acute low back pain  Patient states she is taken Flexeril for similar myalgias in the past.  No urinary or GI complaints at this time.  Will trial low-dose muscle relaxer in conjunction with supportive measures as outlined in patient instructions.  Return precautions discussed, patient verbalized understanding and is agreeable to plan.  Follow Up Instructions: Patient to seek in-person evaluation for persistent, worsening symptoms.   I discussed the assessment and treatment plan with the patient. The patient was provided an opportunity to ask questions and all were answered. The patient agreed with the plan and demonstrated an understanding of the instructions.   The patient was advised to call back or seek an in-person evaluation if the symptoms worsen or if the condition fails to improve as anticipated.  I provided 15 minutes of non-face-to-face time during this encounter.    [place], PA-C  02/15/2019 8:39 AM        Reed, [place], PA-C 02/15/19 0848

## 2019-04-15 ENCOUNTER — Encounter (HOSPITAL_COMMUNITY): Payer: Self-pay | Admitting: Family Medicine

## 2019-04-15 ENCOUNTER — Other Ambulatory Visit: Payer: Self-pay

## 2019-04-15 ENCOUNTER — Ambulatory Visit (HOSPITAL_COMMUNITY)
Admission: EM | Admit: 2019-04-15 | Discharge: 2019-04-15 | Disposition: A | Discharge status: Home / Self Care | Payer: Self-pay | Attending: Family Medicine | Admitting: Family Medicine

## 2019-04-15 DIAGNOSIS — M25512 Pain in left shoulder: Secondary | ICD-10-CM

## 2019-04-15 MED ORDER — HYDROCODONE-ACETAMINOPHEN 5-325 MG PO TABS: 1.0000 | ORAL_TABLET | Freq: Four times a day (QID) | ORAL | 0 refills | Status: DC | PRN

## 2019-04-15 MED ORDER — PREDNISONE 50 MG PO TABS: ORAL_TABLET | ORAL | 0 refills | Status: DC

## 2019-04-15 NOTE — ED Provider Notes (Signed)
[place]    CSN: [id number] Arrival date & time: 04/15/19  1630      History   Chief Complaint Chief Complaint  Patient presents with  . Shoulder Pain    HPI Laurie Reed is a 33 y.o. female.   33 year old established [place] urgent care patient presents with left shoulder pain.   Pt presents with left side shoulder pain not associated with any injury  X 4 days with no relief with ice, heat and pain medication.   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  She is physically back working on Tuesday.  She says that she just woke up with the pain on Thanksgiving day.  Shoulder hurts all the time but is worse with trying to move it.     Past Medical History:  Diagnosis Date  . Acid reflux 2004  . Anemia   . Anxiety 2008  . Asthma 1993   no previous intubations or hospitalizations   . Chlamydia   . Depression 2008   no meds, currenly ok  . Environmental allergies   . Migraines   . Miscarriage   . Ovarian cyst   . Pre-diabetes   . Ulcer of the stomach and intestine 2004  . Urinary tract infection     Patient Active Problem List   Diagnosis Date Noted  . Anemia in pregnancy 01/23/2018  . Morbid obesity with BMI of 45.0-49.9, adult ([facility]) 10/19/2017  . Seasonal allergic rhinitis 11/01/2016  . Asthma 06/30/2015  . Vitamin D deficiency 01/22/2015  . Anxiety and depression 01/21/2015    Past Surgical History:  Procedure Laterality Date  . COLONOSCOPY  2004   . IRRIGATION AND DEBRIDEMENT SEBACEOUS CYST N/A 06/01/2017   Procedure: EXCISION SEBACEOUS CYST ON SCALP;  Surgeon: Vickie Epley, MD;  Location: [facility];  Service: General;  Laterality: N/A;  . MULTIPLE TOOTH EXTRACTIONS  2015   6 teeth removed   . TUBAL LIGATION N/A 04/04/2018   Procedure: POST PARTUM TUBAL LIGATION;  Surgeon: Truett Mainland, DO;  Location: [facility];  Service: Gynecology;  Laterality: N/A;  . WISDOM  TOOTH EXTRACTION  2007    OB History    Gravida  7   Para  4   Term  4   Preterm  0   AB  3   Living  4     SAB  3   TAB  0   Ectopic  0   Multiple  0   Live Births  4            Home Medications    Prior to Admission medications   Medication Sig Start Date End Date Taking? Authorizing Provider  acetaminophen (TYLENOL) 500 MG tablet Take 500-1,000 mg by mouth every 6 (six) hours as needed for mild pain, moderate pain or headache (or migraines).     [provider]  diphenhydrAMINE (BENADRYL) 25 mg capsule Take 25-50 mg by mouth 2 (two) times daily as needed for allergies.    [provider]  famotidine (PEPCID) 20 MG tablet Take 1 tablet (20 mg total) by mouth 2 (two) times daily. 11/05/18   Carlisle Cater, PA-C  ferrous sulfate 324 (65 Fe) MG TBEC 1 pill daily after meal to help with anemia 11/22/18   [name], [name], MD  fexofenadine (ALLEGRA) 180 MG tablet Take 180 mg by mouth daily as needed for allergies or rhinitis.  [provider]  fluticasone (FLONASE) 50 MCG/ACT nasal spray Place 1 spray into both nostrils daily.    [provider]  HYDROcodone-acetaminophen (NORCO) 5-325 MG tablet Take 1 tablet by mouth every 6 (six) hours as needed for moderate pain. 04/15/19   Elvina SidleLauenstein, Leveda Kendrix, MD  ibuprofen (ADVIL,MOTRIN) 600 MG tablet Take 1 tablet (600 mg total) by mouth every 6 (six) hours. Patient taking differently: Take 600 mg by mouth every 6 (six) hours as needed for headache, mild pain or moderate pain (or migraines).  04/06/18   Shirley, [place], DO  naproxen (NAPROSYN) 500 MG tablet Take 1 tablet (500 mg total) by mouth 2 (two) times daily. 11/05/18   Renne CriglerGeiple, Joshua, PA-C  predniSONE (DELTASONE) 50 MG tablet One daily with food 04/15/19   Elvina SidleLauenstein, Adriona Kaney, MD  SUMAtriptan (IMITREX) 50 MG tablet Take 1 tablet (50 mg total) by mouth once as needed (at the onset of a migraine- may repeat once in 2 hours if headache persists or  recurs). 11/22/18   Hoy RegisterNewlin, Enobong, MD  topiramate (TOPAMAX) 50 MG tablet Take 1 tablet (50 mg total) by mouth 2 (two) times daily. 11/22/18   Hoy RegisterNewlin, Enobong, MD  cetirizine (ZYRTEC) 10 MG tablet Take 1 tablet (10 mg total) by mouth daily. Patient not taking: Reported on 09/11/2018 12/07/17 11/05/18  Judeth HornLawrence, Erin, NP    Family History Family History  Problem Relation Age of Onset  . Asthma Mother   . Heart disease Mother   . Hypertension Mother   . Asthma Maternal Aunt   . Hypertension Maternal Aunt   . Diabetes Maternal Aunt   . Asthma Maternal Uncle   . Hypertension Maternal Uncle   . Diabetes Maternal Uncle   . Heart disease Maternal Grandmother   . Diabetes Maternal Grandmother   . Hypertension Maternal Grandmother   . Heart disease Maternal Grandfather   . Diabetes Maternal Grandfather   . Hypertension Maternal Grandfather   . Hypertension Paternal Grandmother   . Hypotension Neg Hx   . Anesthesia problems Neg Hx   . Malignant hyperthermia Neg Hx   . Pseudochol deficiency Neg Hx   . Cancer Neg Hx     Social History Social History   Tobacco Use  . Smoking status: Never Smoker  . Smokeless tobacco: Never Used  Substance Use Topics  . Alcohol use: No    Alcohol/week: 0.0 standard drinks  . Drug use: No     Allergies   Other, Latex, Tree extract, and Zofran   Review of Systems Review of Systems  Musculoskeletal: Positive for myalgias.  All other systems reviewed and are negative.    Physical Exam Triage Vital Signs ED Triage Vitals  Enc Vitals Group     BP      Pulse      Resp      Temp      Temp src      SpO2      Weight      Height      Head Circumference      Peak Flow      Pain Score      Pain Loc      Pain Edu?      Excl. in GC?    No data found.  Updated Vital Signs BP 139/84 (BP Location: Right Arm)   Pulse 79   Temp 98.4 F (36.9 C) (Oral)   Resp 17   LMP 04/04/2019   SpO2 98%    Physical Exam  Vitals signs and nursing note  reviewed.  Constitutional:      General: She is not in acute distress.    Appearance: Normal appearance. She is obese. She is not ill-appearing.  HENT:     Head: Normocephalic.  Eyes:     Conjunctiva/sclera: Conjunctivae normal.  Neck:     Musculoskeletal: Normal range of motion and neck supple.  Cardiovascular:     Rate and Rhythm: Normal rate.  Pulmonary:     Effort: Pulmonary effort is normal.  Musculoskeletal:        General: Tenderness present. No swelling, deformity or signs of injury.     Comments: Patient unwilling to move her shoulder through range of motion because of pain.  She says she has tenderness in the anterior and posterior joint line.  Skin:    General: Skin is warm and dry.  Neurological:     General: No focal deficit present.     Mental Status: She is alert.  Psychiatric:        Mood and Affect: Mood normal.      UC Treatments / Results  Labs (all labs ordered are listed, but only abnormal results are displayed) Labs Reviewed - No data to display  EKG   Radiology No results found.  Procedures Procedures (including critical care time)  Medications Ordered in UC Medications - No data to display  Initial Impression / Assessment and Plan / UC Course  I have reviewed the triage vital signs and the nursing notes.  Pertinent labs & imaging results that were available during my care of the patient were reviewed by me and considered in my medical decision making (see chart for details).    Final Clinical Impressions(s) / UC Diagnoses   Final diagnoses:  Acute pain of left shoulder     Discharge Instructions     Your shoulder pain most likely comes from sleeping on your side in an awkward way.  Expect the pain to resolve in 2 days or return for recheck.    ED Prescriptions    Medication Sig Dispense Auth. Provider   predniSONE (DELTASONE) 50 MG tablet One daily with food 3 tablet Elvina Sidle, MD   HYDROcodone-acetaminophen (NORCO)  5-325 MG tablet Take 1 tablet by mouth every 6 (six) hours as needed for moderate pain. 10 tablet Elvina Sidle, MD     I have reviewed the PDMP during this encounter.   Elvina Sidle, MD 04/15/19 1722

## 2019-04-15 NOTE — Discharge Instructions (Signed)
Your shoulder pain most likely comes from sleeping on your side in an awkward way.  Expect the pain to resolve in 2 days or return for recheck.

## 2019-04-15 NOTE — ED Triage Notes (Signed)
Pt presents with left side shoulder pain not associated with any injury  X 4 days with no relief with ice, heat and pain medication.

## 2019-04-18 ENCOUNTER — Emergency Department (HOSPITAL_COMMUNITY): Payer: Self-pay

## 2019-04-18 ENCOUNTER — Emergency Department (HOSPITAL_COMMUNITY)
Admission: EM | Admit: 2019-04-18 | Discharge: 2019-04-18 | Disposition: A | Discharge status: Home / Self Care | Payer: Self-pay | Attending: Emergency Medicine | Admitting: Emergency Medicine

## 2019-04-18 ENCOUNTER — Encounter (HOSPITAL_COMMUNITY): Payer: Self-pay | Admitting: Family Medicine

## 2019-04-18 DIAGNOSIS — Y999 Unspecified external cause status: Secondary | ICD-10-CM | POA: Insufficient documentation

## 2019-04-18 DIAGNOSIS — J45909 Unspecified asthma, uncomplicated: Secondary | ICD-10-CM | POA: Insufficient documentation

## 2019-04-18 DIAGNOSIS — Y92008 Other place in unspecified non-institutional (private) residence as the place of occurrence of the external cause: Secondary | ICD-10-CM | POA: Insufficient documentation

## 2019-04-18 DIAGNOSIS — S82892A Other fracture of left lower leg, initial encounter for closed fracture: Secondary | ICD-10-CM | POA: Insufficient documentation

## 2019-04-18 DIAGNOSIS — Z79899 Other long term (current) drug therapy: Secondary | ICD-10-CM | POA: Insufficient documentation

## 2019-04-18 DIAGNOSIS — Y9389 Activity, other specified: Secondary | ICD-10-CM | POA: Insufficient documentation

## 2019-04-18 DIAGNOSIS — X500XXA Overexertion from strenuous movement or load, initial encounter: Secondary | ICD-10-CM | POA: Insufficient documentation

## 2019-04-18 MED ORDER — OXYCODONE-ACETAMINOPHEN 5-325 MG PO TABS
1.0000 | ORAL_TABLET | Freq: Once | ORAL | Status: AC
  Administered 2019-04-18: 20:00:00 1 via ORAL
  Filled 2019-04-18: qty 1

## 2019-04-18 NOTE — ED Triage Notes (Signed)
Patient is from home and transported via [facility]. Patient was stepping off the porch and missed a step. She is complaining of left foot pain. Also, complains of left shoulder pain but this is a pre-existing pain that she normally takes Hydrocodone for.

## 2019-04-18 NOTE — Discharge Instructions (Addendum)
Do not bare weight onto ankle until you can see orthopedist. Use crutches to help with ambulation. Take Tylenol and Ibuprofen as needed for the pain. While at home please rest, ice, and elevate your ankle to help with pain and inflammation. Follow up with your PCP regarding your ED visit as well.

## 2019-04-18 NOTE — ED Notes (Signed)
An After Visit Summary was printed and given to the patient. Discharge instructions given to patient and no further questions at this time. Pt leaving with husband.

## 2019-04-18 NOTE — ED Provider Notes (Signed)
[facility]-EMERGENCY DEPT Provider Note   CSN: [id number] Arrival date & time: 04/18/19  1714     History   Chief Complaint Chief Complaint  Patient presents with  . Foot Pain    HPI Laurie Reed is a 33 y.o. female who presents to the ED today via EMS for sudden onset, constant, severe, 10/10, left ankle pain s/p mechanical fall that occurred just prior to arrival.  Reports she was stepping off her porch and missed a step when she inverted her left ankle and fell down with her outstretched hands.  No head injury or loss of consciousness.  Dates that she was unable to get up due to the pain in her left ankle causing her to call EMS.  Does report about 1 year ago she injured the same ankle while at work and was seen by an orthopedist for soft tissue injury; no fractures.  She has no other complaints at this time.  Denies fever, chills, wound, weakness, numbness, any other associated symptoms.        Past Medical History:  Diagnosis Date  . Acid reflux 2004  . Anemia   . Anxiety 2008  . Asthma 1993   no previous intubations or hospitalizations   . Chlamydia   . Depression 2008   no meds, currenly ok  . Environmental allergies   . Migraines   . Miscarriage   . Ovarian cyst   . Pre-diabetes   . Ulcer of the stomach and intestine 2004  . Urinary tract infection     Patient Active Problem List   Diagnosis Date Noted  . Anemia in pregnancy 01/23/2018  . Morbid obesity with BMI of 45.0-49.9, adult ([facility]) 10/19/2017  . Seasonal allergic rhinitis 11/01/2016  . Asthma 06/30/2015  . Vitamin D deficiency 01/22/2015  . Anxiety and depression 01/21/2015    Past Surgical History:  Procedure Laterality Date  . COLONOSCOPY  2004   . IRRIGATION AND DEBRIDEMENT SEBACEOUS CYST N/A 06/01/2017   Procedure: EXCISION SEBACEOUS CYST ON SCALP;  Surgeon: Ancil Linsey, MD;  Location: [facility];  Service: General;  Laterality: N/A;  . MULTIPLE TOOTH EXTRACTIONS   2015   6 teeth removed   . TUBAL LIGATION N/A 04/04/2018   Procedure: POST PARTUM TUBAL LIGATION;  Surgeon: Levie Heritage, DO;  Location: [facility];  Service: Gynecology;  Laterality: N/A;  . WISDOM TOOTH EXTRACTION  2007     OB History    Gravida  7   Para  4   Term  4   Preterm  0   AB  3   Living  4     SAB  3   TAB  0   Ectopic  0   Multiple  0   Live Births  4            Home Medications    Prior to Admission medications   Medication Sig Start Date End Date Taking? Authorizing Provider  acetaminophen (TYLENOL) 500 MG tablet Take 500-1,000 mg by mouth every 6 (six) hours as needed for mild pain, moderate pain or headache (or migraines).     [provider]  diphenhydrAMINE (BENADRYL) 25 mg capsule Take 25-50 mg by mouth 2 (two) times daily as needed for allergies.    [provider]  famotidine (PEPCID) 20 MG tablet Take 1 tablet (20 mg total) by mouth 2 (two) times daily. 11/05/18   Renne Crigler, PA-C  ferrous sulfate 324 (65 Fe)  MG TBEC 1 pill daily after meal to help with anemia 11/22/18   [name], [name], MD  fexofenadine (ALLEGRA) 180 MG tablet Take 180 mg by mouth daily as needed for allergies or rhinitis.     [provider]  fluticasone (FLONASE) 50 MCG/ACT nasal spray Place 1 spray into both nostrils daily.    [provider]  HYDROcodone-acetaminophen (NORCO) 5-325 MG tablet Take 1 tablet by mouth every 6 (six) hours as needed for moderate pain. 04/15/19   Robyn Haber, MD  ibuprofen (ADVIL,MOTRIN) 600 MG tablet Take 1 tablet (600 mg total) by mouth every 6 (six) hours. Patient taking differently: Take 600 mg by mouth every 6 (six) hours as needed for headache, mild pain or moderate pain (or migraines).  04/06/18   Shirley, [place], DO  naproxen (NAPROSYN) 500 MG tablet Take 1 tablet (500 mg total) by mouth 2 (two) times daily. 11/05/18   Carlisle Cater, PA-C  predniSONE (DELTASONE) 50 MG tablet One daily  with food 04/15/19   Robyn Haber, MD  SUMAtriptan (IMITREX) 50 MG tablet Take 1 tablet (50 mg total) by mouth once as needed (at the onset of a migraine- may repeat once in 2 hours if headache persists or recurs). 11/22/18   Charlott Rakes, MD  topiramate (TOPAMAX) 50 MG tablet Take 1 tablet (50 mg total) by mouth 2 (two) times daily. 11/22/18   Charlott Rakes, MD  cetirizine (ZYRTEC) 10 MG tablet Take 1 tablet (10 mg total) by mouth daily. Patient not taking: Reported on 09/11/2018 12/07/17 11/05/18  Jorje Guild, NP    Family History Family History  Problem Relation Age of Onset  . Asthma Mother   . Heart disease Mother   . Hypertension Mother   . Asthma Maternal Aunt   . Hypertension Maternal Aunt   . Diabetes Maternal Aunt   . Asthma Maternal Uncle   . Hypertension Maternal Uncle   . Diabetes Maternal Uncle   . Heart disease Maternal Grandmother   . Diabetes Maternal Grandmother   . Hypertension Maternal Grandmother   . Heart disease Maternal Grandfather   . Diabetes Maternal Grandfather   . Hypertension Maternal Grandfather   . Hypertension Paternal Grandmother   . Hypotension Neg Hx   . Anesthesia problems Neg Hx   . Malignant hyperthermia Neg Hx   . Pseudochol deficiency Neg Hx   . Cancer Neg Hx     Social History Social History   Tobacco Use  . Smoking status: Never Smoker  . Smokeless tobacco: Never Used  Substance Use Topics  . Alcohol use: No    Alcohol/week: 0.0 standard drinks  . Drug use: No     Allergies   Other, Latex, Tree extract, and Zofran   Review of Systems Review of Systems  Constitutional: Negative for chills and fever.  Musculoskeletal: Positive for arthralgias.  Neurological: Negative for syncope and headaches.     Physical Exam Updated Vital Signs BP 133/85 (BP Location: Right Arm)   Pulse 96   Temp 98.1 F (36.7 C) (Oral)   Resp 18   LMP 04/04/2019   SpO2 99%   Physical Exam Vitals signs and nursing note reviewed.   Constitutional:      Appearance: She is not ill-appearing.  HENT:     Head: Normocephalic and atraumatic.  Eyes:     Conjunctiva/sclera: Conjunctivae normal.  Cardiovascular:     Rate and Rhythm: Normal rate and regular rhythm.     Pulses: Normal pulses.  Pulmonary:  Effort: Pulmonary effort is normal.     Breath sounds: Normal breath sounds. No wheezing, rhonchi or rales.  Musculoskeletal:     Comments: Mild swelling noted to left ankle compared to right. TTP diffusely to ankle but mostly greater on lateral malleolus compared to medial malleolus. ROM limited due to pain. No obvious tenderness to mid foot. Able to wiggle toes without difficulty. Cap refill < 2 seconds. 2+ DP pulses.   Skin:    General: Skin is warm and dry.     Coloration: Skin is not jaundiced.  Neurological:     Mental Status: She is alert.      ED Treatments / Results  Labs (all labs ordered are listed, but only abnormal results are displayed) Labs Reviewed - No data to display  EKG None  Radiology Dg Ankle Complete Left  Result Date: 04/18/2019 CLINICAL DATA:  Foot injury EXAM: LEFT ANKLE COMPLETE - 3+ VIEW COMPARISON:  02/24/2017 FINDINGS: Possible tiny avulsion injury off the tip of the medial malleolus. Ankle mortise is symmetric. Mild soft tissue swelling is present IMPRESSION: Possible acute avulsion injury off the tip of the medial malleolus Electronically Signed   By: Jasmine Pang M.D.   On: 04/18/2019 18:58   Dg Foot Complete Left  Result Date: 04/18/2019 CLINICAL DATA:  Foot injury EXAM: LEFT FOOT - COMPLETE 3+ VIEW COMPARISON:  None. FINDINGS: There is no evidence of fracture or dislocation. There is no evidence of arthropathy or other focal bone abnormality. Soft tissues are unremarkable. IMPRESSION: Negative. Electronically Signed   By: Jasmine Pang M.D.   On: 04/18/2019 18:57    Procedures Procedures (including critical care time)  Medications Ordered in ED Medications   oxyCODONE-acetaminophen (PERCOCET/ROXICET) 5-325 MG per tablet 1 tablet (1 tablet Oral Given 04/18/19 1955)     Initial Impression / Assessment and Plan / ED Course  I have reviewed the triage vital signs and the nursing notes.  Pertinent labs & imaging results that were available during my care of the patient were reviewed by me and considered in my medical decision making (see chart for details).    33 year old female presents the ED with left ankle injury status post mechanical fall.  Head injury or loss of consciousness.  Left ankle obviously swollen compared to right with tenderness to palpation diffusely to ankle joint.  Range of motion limited due to pain.  Patient tearful on exam with palpation.  Does not appear she has taken anything for pain.  Will give a Percocet in the ED today.  X-rays obtained -small avulsion fracture to tip of medial malleolus.  I have personally reviewed old x-rays of the ankle in 2018 and this avulsion fracture does appear to be new.  Will apply splint and give crutches and have patient follow-up with orthopedics.  Does appear she saw [facility] in the past last year for previous injury to left ankle.  Have advised that she follow-up with them again as they are familiar with her.  Is to not bear any weight on ankle until he sees Ortho. She is in agreement with plan at this time and stable for discharge home.   This note was prepared using Dragon voice recognition software and may include unintentional dictation errors due to the inherent limitations of voice recognition software.       Final Clinical Impressions(s) / ED Diagnoses   Final diagnoses:  Avulsion fracture of ankle, left, closed, initial encounter    ED Discharge Orders    None  Tanda RockersVenter, Aliea Bobe, PA-C 04/18/19 2015    Melene PlanFloyd, Dan, DO 04/18/19 2229

## 2019-04-27 ENCOUNTER — Emergency Department (HOSPITAL_COMMUNITY): Payer: Self-pay

## 2019-04-27 ENCOUNTER — Emergency Department (HOSPITAL_COMMUNITY)
Admission: EM | Admit: 2019-04-27 | Discharge: 2019-04-27 | Disposition: A | Discharge status: Home / Self Care | Payer: Self-pay | Attending: Emergency Medicine | Admitting: Emergency Medicine

## 2019-04-27 ENCOUNTER — Encounter (HOSPITAL_COMMUNITY): Payer: Self-pay

## 2019-04-27 ENCOUNTER — Other Ambulatory Visit: Payer: Self-pay

## 2019-04-27 DIAGNOSIS — M25572 Pain in left ankle and joints of left foot: Secondary | ICD-10-CM | POA: Insufficient documentation

## 2019-04-27 DIAGNOSIS — Z91040 Latex allergy status: Secondary | ICD-10-CM | POA: Insufficient documentation

## 2019-04-27 DIAGNOSIS — J45909 Unspecified asthma, uncomplicated: Secondary | ICD-10-CM | POA: Insufficient documentation

## 2019-04-27 DIAGNOSIS — G8929 Other chronic pain: Secondary | ICD-10-CM | POA: Insufficient documentation

## 2019-04-27 DIAGNOSIS — Z79899 Other long term (current) drug therapy: Secondary | ICD-10-CM | POA: Insufficient documentation

## 2019-04-27 DIAGNOSIS — M25512 Pain in left shoulder: Secondary | ICD-10-CM | POA: Insufficient documentation

## 2019-04-27 MED ORDER — IBUPROFEN 600 MG PO TABS: 600.0000 mg | ORAL_TABLET | Freq: Four times a day (QID) | ORAL | 0 refills | Status: DC | PRN

## 2019-04-27 MED ORDER — IBUPROFEN 200 MG PO TABS
600.0000 mg | ORAL_TABLET | Freq: Once | ORAL | Status: AC
  Administered 2019-04-27: 600 mg via ORAL
  Filled 2019-04-27: qty 3

## 2019-04-27 MED ORDER — HYDROCODONE-ACETAMINOPHEN 5-325 MG PO TABS: 1.0000 | ORAL_TABLET | Freq: Four times a day (QID) | ORAL | 0 refills | Status: DC | PRN

## 2019-04-27 NOTE — ED Provider Notes (Signed)
[facility] DEPT Provider Note   CSN: [id number] Arrival date & time: 04/27/19  0033     History Chief Complaint  Patient presents with  . Foot Pain    Laurie Reed is a 33 y.o. female.  Patient to ED with complaint of left ankle pain and left shoulder pain. She reports she recently fell injuring the left ankle and fell again tonight. She fell out of her chair and caused the ankle to stretch but did not fall directly on the joint. She has a posterior splint in place and has been using crutches to get around. She reports ongoing left shoulder pain without known injury that is worse over the last 24 hours. No swelling or discoloration. No neck pain. No weakness or numbness. She has been taking Tylenol and ibuprofen without relief.   No language interpreter was used.  Foot Pain       Past Medical History:  Diagnosis Date  . Acid reflux 2004  . Anemia   . Anxiety 2008  . Asthma 1993   no previous intubations or hospitalizations   . Chlamydia   . Depression 2008   no meds, currenly ok  . Environmental allergies   . Migraines   . Miscarriage   . Ovarian cyst   . Pre-diabetes   . Ulcer of the stomach and intestine 2004  . Urinary tract infection     Patient Active Problem List   Diagnosis Date Noted  . Anemia in pregnancy 01/23/2018  . Morbid obesity with BMI of 45.0-49.9, adult ([place]) 10/19/2017  . Seasonal allergic rhinitis 11/01/2016  . Asthma 06/30/2015  . Vitamin D deficiency 01/22/2015  . Anxiety and depression 01/21/2015    Past Surgical History:  Procedure Laterality Date  . COLONOSCOPY  2004   . IRRIGATION AND DEBRIDEMENT SEBACEOUS CYST N/A 06/01/2017   Procedure: EXCISION SEBACEOUS CYST ON SCALP;  Surgeon: Vickie Epley, MD;  Location: [facility];  Service: General;  Laterality: N/A;  . MULTIPLE TOOTH EXTRACTIONS  2015   6 teeth removed   . TUBAL LIGATION N/A 04/04/2018   Procedure: POST PARTUM TUBAL LIGATION;   Surgeon: Truett Mainland, DO;  Location: [facility];  Service: Gynecology;  Laterality: N/A;  . WISDOM TOOTH EXTRACTION  2007     OB History    Gravida  7   Para  4   Term  4   Preterm  0   AB  3   Living  4     SAB  3   TAB  0   Ectopic  0   Multiple  0   Live Births  4           Family History  Problem Relation Age of Onset  . Asthma Mother   . Heart disease Mother   . Hypertension Mother   . Asthma Maternal Aunt   . Hypertension Maternal Aunt   . Diabetes Maternal Aunt   . Asthma Maternal Uncle   . Hypertension Maternal Uncle   . Diabetes Maternal Uncle   . Heart disease Maternal Grandmother   . Diabetes Maternal Grandmother   . Hypertension Maternal Grandmother   . Heart disease Maternal Grandfather   . Diabetes Maternal Grandfather   . Hypertension Maternal Grandfather   . Hypertension Paternal Grandmother   . Hypotension Neg Hx   . Anesthesia problems Neg Hx   . Malignant hyperthermia Neg Hx   . Pseudochol deficiency Neg Hx   . Cancer  Neg Hx     Social History   Tobacco Use  . Smoking status: Never Smoker  . Smokeless tobacco: Never Used  Substance Use Topics  . Alcohol use: No    Alcohol/week: 0.0 standard drinks  . Drug use: No    Home Medications Prior to Admission medications   Medication Sig Start Date End Date Taking? Authorizing Provider  acetaminophen (TYLENOL) 500 MG tablet Take 500-1,000 mg by mouth every 6 (six) hours as needed for mild pain, moderate pain or headache (or migraines).     [provider]  diphenhydrAMINE (BENADRYL) 25 mg capsule Take 25-50 mg by mouth 2 (two) times daily as needed for allergies.    [provider]  famotidine (PEPCID) 20 MG tablet Take 1 tablet (20 mg total) by mouth 2 (two) times daily. 11/05/18   Renne Crigler, PA-C  ferrous sulfate 324 (65 Fe) MG TBEC 1 pill daily after meal to help with anemia 11/22/18   [name], [name], MD  fexofenadine (ALLEGRA) 180 MG tablet Take  180 mg by mouth daily as needed for allergies or rhinitis.     [provider]  fluticasone (FLONASE) 50 MCG/ACT nasal spray Place 1 spray into both nostrils daily.    [provider]  HYDROcodone-acetaminophen (NORCO) 5-325 MG tablet Take 1 tablet by mouth every 6 (six) hours as needed for moderate pain. 04/15/19   Elvina Sidle, MD  ibuprofen (ADVIL,MOTRIN) 600 MG tablet Take 1 tablet (600 mg total) by mouth every 6 (six) hours. Patient taking differently: Take 600 mg by mouth every 6 (six) hours as needed for headache, mild pain or moderate pain (or migraines).  04/06/18   Shirley, [place], DO  naproxen (NAPROSYN) 500 MG tablet Take 1 tablet (500 mg total) by mouth 2 (two) times daily. 11/05/18   Renne Crigler, PA-C  predniSONE (DELTASONE) 50 MG tablet One daily with food 04/15/19   Elvina Sidle, MD  SUMAtriptan (IMITREX) 50 MG tablet Take 1 tablet (50 mg total) by mouth once as needed (at the onset of a migraine- may repeat once in 2 hours if headache persists or recurs). 11/22/18   Hoy Register, MD  topiramate (TOPAMAX) 50 MG tablet Take 1 tablet (50 mg total) by mouth 2 (two) times daily. 11/22/18   Hoy Register, MD  cetirizine (ZYRTEC) 10 MG tablet Take 1 tablet (10 mg total) by mouth daily. Patient not taking: Reported on 09/11/2018 12/07/17 11/05/18  Judeth Horn, NP    Allergies    Other, Latex, Tree extract, and Zofran  Review of Systems   Review of Systems  Constitutional: Negative for diaphoresis.  Musculoskeletal:       See HPI.  Skin: Negative.  Negative for color change and wound.  Neurological: Negative.  Negative for weakness and numbness.    Physical Exam Updated Vital Signs BP 114/78   Pulse 89   Temp 98.3 F (36.8 C) (Oral)   Resp 19   LMP 04/04/2019   SpO2 100%   Physical Exam Constitutional:      Appearance: She is well-developed.  Pulmonary:     Effort: Pulmonary effort is normal.  Musculoskeletal:     Cervical back: Normal  range of motion.     Comments: Posterior splint to left ankle removed for exam. No swelling or discoloration. Pulses present. Pain with active and passive range of motion of the ankle. No calf swelling or tenderness.   Skin:    General: Skin is warm and dry.  Comments: No skin breakdown under the splint.   Neurological:     Mental Status: She is alert and oriented to person, place, and time.     ED Results / Procedures / Treatments   Labs (all labs ordered are listed, but only abnormal results are displayed) Labs Reviewed - No data to display  EKG None  Radiology No results found. DG Ankle Complete Left  Result Date: 04/27/2019 CLINICAL DATA:  Posttraumatic left ankle pain. EXAM: LEFT ANKLE COMPLETE - 3+ VIEW COMPARISON:  02/24/2017 FINDINGS: Possible lateral ankle soft tissue swelling. There is no evidence of fracture, dislocation, or joint effusion. There is no evidence of arthropathy or other focal bone abnormality. IMPRESSION: No osseous abnormality Electronically Signed   By: Marnee Spring M.D.   On: 04/27/2019 05:29   DG Ankle Complete Left  Result Date: 04/18/2019 CLINICAL DATA:  Foot injury EXAM: LEFT ANKLE COMPLETE - 3+ VIEW COMPARISON:  02/24/2017 FINDINGS: Possible tiny avulsion injury off the tip of the medial malleolus. Ankle mortise is symmetric. Mild soft tissue swelling is present IMPRESSION: Possible acute avulsion injury off the tip of the medial malleolus Electronically Signed   By: Jasmine Pang M.D.   On: 04/18/2019 18:58   DG Shoulder Left  Result Date: 04/27/2019 CLINICAL DATA:  Atraumatic left shoulder pain for a week EXAM: LEFT SHOULDER - 2+ VIEW COMPARISON:  None. FINDINGS: There is no evidence of fracture or dislocation. There is no evidence of arthropathy or other focal bone abnormality. Soft tissues are unremarkable. IMPRESSION: Negative. Electronically Signed   By: Marnee Spring M.D.   On: 04/27/2019 05:30   DG Foot Complete Left  Result Date:  04/18/2019 CLINICAL DATA:  Foot injury EXAM: LEFT FOOT - COMPLETE 3+ VIEW COMPARISON:  None. FINDINGS: There is no evidence of fracture or dislocation. There is no evidence of arthropathy or other focal bone abnormality. Soft tissues are unremarkable. IMPRESSION: Negative. Electronically Signed   By: Jasmine Pang M.D.   On: 04/18/2019 18:57    Procedures Procedures (including critical care time)  Medications Ordered in ED Medications - No data to display  ED Course  I have reviewed the triage vital signs and the nursing notes.  Pertinent labs & imaging results that were available during my care of the patient were reviewed by me and considered in my medical decision making (see chart for details).    MDM Rules/Calculators/A&P     CHA2DS2/VAS Stroke Risk Points      N/A >= 2 Points: High Risk  1 - 1.99 Points: Medium Risk  0 Points: Low Risk    A final score could not be computed because of missing components.: Last  Change: N/A     This score determines the patient's risk of having a stroke if the  patient has atrial fibrillation.      This score is not applicable to this patient. Components are not  calculated.                   Patient to ED with left shoulder and ankle pain as detailed in HPI.  Chart reviewed. Previous ankle and foot imaging on initial injury show injury limited to avulsion fx only. Re-imaged tonight and films are "normal" showing interim improvement. Exam of the ankle is normal with the exception of subjective discomfort. Offered a CAM walker to allow weight bearing as tolerated.   Shoulder was imaged and is normal. This is an ongoing issue for her. Suggested continued ibuprofen and  PCP f/u for both complaints.  Final Clinical Impression(s) / ED Diagnoses Final diagnoses:  None   1. Left ankle pain 2. Left shoulder pain  Rx / DC Orders ED Discharge Orders    None       Elpidio AnisUpstill, Giovannie Scerbo, PA-C 04/27/19 0629    Paula LibraMolpus, John, MD 04/27/19 [phone]

## 2019-04-27 NOTE — ED Triage Notes (Signed)
Pt L foot broken. She states that she has been getting around in a computer chair and fell out of it today. Her foot is splinted.

## 2019-04-27 NOTE — Discharge Instructions (Addendum)
Follow up with your doctor for further management of ongoing left ankle and shoulder pain. You can be weight bearing as tolerated on the left ankle and can discontinue using the boot when comfortable.

## 2019-05-16 ENCOUNTER — Ambulatory Visit: Payer: Self-pay | Attending: Internal Medicine | Admitting: Internal Medicine

## 2019-05-16 ENCOUNTER — Other Ambulatory Visit: Payer: Self-pay

## 2019-05-16 DIAGNOSIS — M25572 Pain in left ankle and joints of left foot: Secondary | ICD-10-CM

## 2019-05-16 NOTE — Progress Notes (Signed)
Virtual Visit via Telephone Note Due to current restrictions/limitations of in-office visits due to the COVID-19 pandemic, this scheduled clinical appointment was converted to a telehealth visit  I connected with Laurie Reed on 05/16/19 at 4:20 p.m by telephone and verified that I am speaking with the correct person using two identifiers. I am in my office.  The patient is at home.  Only the patient and myself participated in this encounter.  I discussed the limitations, risks, security and privacy concerns of performing an evaluation and management service by telephone and the availability of in person appointments. I also discussed with the patient that there may be a patient responsible charge related to this service. The patient expressed understanding and agreed to proceed.   History of Present Illness: Patient with history of carpal tunnel syndrome, Asthma, obesity, pre DM.  Purpose of today's visit is ER follow-up.  Patient seen in the emergency room 04/18/2019 with acute pain in the left ankle following a mechanical fall.  She was noted to have tenderness on palpation greater over the lateral malleolus compared to the medial, limited range of motion and mild swelling.  X-ray done at the time revealed possible acute avulsion injury of the tip of the medial malleolus.  Patient was given a splint and crutches and told to follow-up with orthopedics.  She return to the emergency room on 04/27/2019 for continued pain in the left ankle.  She had another fall prior to that visit which caused the ankle to stretch but did not fall directly on the joint.  Repeat x-ray revealed no fracture or question of fracture as was seen on the x-ray from 04/18/2019.  Patient was given a cam boot and some hydrocodone.  Since then patient states that she is still being out of work.  She still gets swelling and pain in the left ankle and foot.  Hurts to walk.  Hurts to wear the cam boot.  She elevates the foot and  has been taking ibuprofen.  Reports that the hydrocodone did not help with the pain.  She does not have insurance so has not been able to see orthopedics.  She has completed forms for the orange card/cone discount and has her supporting documents but was told the next available appointment with the financial counselor here is not until the first or second week in January.  When she is approved she would like to be referred to Dr. Penni Bombard at [facility] whom she had seen in the past for an ankle injury. -She is requesting something stronger than hydrocodone for pain.  She has ibuprofen at home.    Observations/Objective: No direct observation done as this was a telephone encounter  Assessment and Plan: 1. Acute left ankle pain -Sprained ankle versus occult fracture versus ligament injury.  However repeat x-ray did not revealed a fracture.  I recommend compression, elevation and heat.  I offered some tramadol for her to take with the ibuprofen however patient states that she has tramadol and does not think that they would help.  I told her that I do not prescribe Percocet or morphine.   -Message sent to our financial counselor to see if he can get her in to review her documents so that she can get approved for the orange card/cone discount.  Once approved she will let me know so that I can submit the referral to orthopedics.   Follow Up Instructions: As needed  I discussed the assessment and treatment plan with the patient. The  patient was provided an opportunity to ask questions and all were answered. The patient agreed with the plan and demonstrated an understanding of the instructions.   The patient was advised to call back or seek an in-person evaluation if the symptoms worsen or if the condition fails to improve as anticipated.  I provided 11 minutes of non-face-to-face time during this encounter.   Karle Plumber, MD

## 2019-06-11 ENCOUNTER — Ambulatory Visit: Payer: Medicaid Other

## 2019-06-13 ENCOUNTER — Other Ambulatory Visit: Payer: Self-pay

## 2019-06-13 ENCOUNTER — Ambulatory Visit: Payer: Medicaid Other | Attending: Family Medicine | Admitting: Family Medicine

## 2019-06-13 ENCOUNTER — Encounter: Payer: Self-pay | Admitting: Family Medicine

## 2019-06-13 VITALS — BP 116/75 | HR 80 | Temp 97.2°F | Ht 62.0 in | Wt 271.0 lb

## 2019-06-13 DIAGNOSIS — R1013 Epigastric pain: Secondary | ICD-10-CM | POA: Diagnosis present

## 2019-06-13 DIAGNOSIS — R7303 Prediabetes: Secondary | ICD-10-CM | POA: Insufficient documentation

## 2019-06-13 DIAGNOSIS — R1084 Generalized abdominal pain: Secondary | ICD-10-CM

## 2019-06-13 DIAGNOSIS — Z791 Long term (current) use of non-steroidal anti-inflammatories (NSAID): Secondary | ICD-10-CM

## 2019-06-13 DIAGNOSIS — Z79899 Other long term (current) drug therapy: Secondary | ICD-10-CM | POA: Diagnosis not present

## 2019-06-13 DIAGNOSIS — R072 Precordial pain: Secondary | ICD-10-CM | POA: Diagnosis not present

## 2019-06-13 DIAGNOSIS — K219 Gastro-esophageal reflux disease without esophagitis: Secondary | ICD-10-CM

## 2019-06-13 MED ORDER — OMEPRAZOLE 40 MG PO CPDR: 40.0000 mg | DELAYED_RELEASE_CAPSULE | Freq: Two times a day (BID) | ORAL | 3 refills | Status: DC

## 2019-06-13 MED ORDER — DICYCLOMINE HCL 10 MG PO CAPS: 10.0000 mg | ORAL_CAPSULE | Freq: Three times a day (TID) | ORAL | 1 refills | Status: DC

## 2019-06-13 MED ORDER — SUCRALFATE 1 GM/10ML PO SUSP: 1.0000 g | Freq: Three times a day (TID) | ORAL | 0 refills | Status: DC

## 2019-06-13 NOTE — Patient Instructions (Addendum)
Food Choices for Gastroesophageal Reflux Disease, Adult When you have gastroesophageal reflux disease (GERD), the foods you eat and your eating habits are very important. Choosing the right foods can help ease your discomfort. Think about working with a nutrition specialist (dietitian) to help you make good choices. What are tips for following this plan?  Meals  Choose healthy foods that are low in fat, such as fruits, vegetables, whole grains, low-fat dairy products, and lean meat, fish, and poultry.  Eat small meals often instead of 3 large meals a day. Eat your meals slowly, and in a place where you are relaxed. Avoid bending over or lying down until 2-3 hours after eating.  Avoid eating meals 2-3 hours before bed.  Avoid drinking a lot of liquid with meals.  Cook foods using methods other than frying. Bake, grill, or broil food instead.  Avoid or limit: ? Chocolate. ? Peppermint or spearmint. ? Alcohol. ? Pepper. ? Black and decaffeinated coffee. ? Black and decaffeinated tea. ? Bubbly (carbonated) soft drinks. ? Caffeinated energy drinks and soft drinks.  Limit high-fat foods such as: ? Fatty meat or fried foods. ? Whole milk, cream, butter, or ice cream. ? Nuts and nut butters. ? Pastries, donuts, and sweets made with butter or shortening.  Avoid foods that cause symptoms. These foods may be different for everyone. Common foods that cause symptoms include: ? Tomatoes. ? Oranges, lemons, and limes. ? Peppers. ? Spicy food. ? Onions and garlic. ? Vinegar. Lifestyle  Maintain a healthy weight. Ask your doctor what weight is healthy for you. If you need to lose weight, work with your doctor to do so safely.  Exercise for at least 30 minutes for 5 or more days each week, or as told by your doctor.  Wear loose-fitting clothes.  Do not smoke. If you need help quitting, ask your doctor.  Sleep with the head of your bed higher than your feet. Use a wedge under the  mattress or blocks under the bed frame to raise the head of the bed. Summary  When you have gastroesophageal reflux disease (GERD), food and lifestyle choices are very important in easing your symptoms.  Eat small meals often instead of 3 large meals a day. Eat your meals slowly, and in a place where you are relaxed.  Limit high-fat foods such as fatty meat or fried foods.  Avoid bending over or lying down until 2-3 hours after eating.  Avoid peppermint and spearmint, caffeine, alcohol, and chocolate. This information is not intended to replace advice given to you by your health care provider. Make sure you discuss any questions you have with your health care provider. Document Revised: 08/24/2018 Document Reviewed: 06/08/2016 Elsevier Patient Education  2020 [employer].  Stop the use of naproxen or ibuprofen and take tylenol or tramadol for pain. Go to ED if acute worsening of abdominal pain, blood in the stool or black stools.

## 2019-06-13 NOTE — Progress Notes (Signed)
Pt. Stated everything she eats or drink her stomach feels painful and comes with cramp. Pt. Described it as a contraction pain.

## 2019-06-13 NOTE — Progress Notes (Signed)
Established Patient Office Visit  Subjective:  Patient ID: Laurie Reed, female    DOB: [birth year]  Age: 34 y.o. MRN: [id number]  CC: No chief complaint on file.   HPI Laurie Reed, 33 yo [place]- American female, patient of Dr. Durenda Age who presents secondary to the complaint of mid upper abdominal pain and generalized abdominal pain. She was most recently seen for telemedicine visit on 05/16/2019 by her PCP in follow-up of ED visit due to acute onset of left ankle pain for which she had been seen on 04/18/2019 and 12/11/220. She had been unable at that time to be seen by Orthopedics due to a lack of insurance and was to work on completing the application for the [employer] financial assistance program.          At today's visit, patient complains of generalized abdominal pain over the entire anterior portion of her abdomen.  She reports that this has been going on for 4 weeks altogether.  Discomfort was occasionally off and on but for the past 2 weeks pain is constant and is between an 8-10 on a 0-to-10 scale.  Pain is present with or without eating.  Eating or drinking liquids of any kind makes the pain worse.  She has some occasional nausea.  She denies constipation or diarrhea.  She has had no blood in the stool and no black stools.  Abdominal pain in the mid upper abdomen is sometimes worse when she lies down at night to sleep.  She occasionally has sensation of bad tasting fluid/liquid going into her mouth/throat and a burning sensation in the middle of her chest.  She has had similar symptoms in the past with acid reflux.  Past Medical History:  Diagnosis Date  . Acid reflux 2004  . Anemia   . Anxiety 2008  . Asthma 1993   no previous intubations or hospitalizations   . Chlamydia   . Depression 2008   no meds, currenly ok  . Environmental allergies   . Migraines   . Miscarriage   . Ovarian cyst   . Pre-diabetes   . Ulcer of the stomach and intestine 2004  . Urinary tract  infection     Past Surgical History:  Procedure Laterality Date  . COLONOSCOPY  2004   . IRRIGATION AND DEBRIDEMENT SEBACEOUS CYST N/A 06/01/2017   Procedure: EXCISION SEBACEOUS CYST ON SCALP;  Surgeon: Vickie Epley, MD;  Location: [facility];  Service: General;  Laterality: N/A;  . MULTIPLE TOOTH EXTRACTIONS  2015   6 teeth removed   . TUBAL LIGATION N/A 04/04/2018   Procedure: POST PARTUM TUBAL LIGATION;  Surgeon: Truett Mainland, DO;  Location: [facility];  Service: Gynecology;  Laterality: N/A;  . WISDOM TOOTH EXTRACTION  2007    Family History  Problem Relation Age of Onset  . Asthma Mother   . Heart disease Mother   . Hypertension Mother   . Asthma Maternal Aunt   . Hypertension Maternal Aunt   . Diabetes Maternal Aunt   . Asthma Maternal Uncle   . Hypertension Maternal Uncle   . Diabetes Maternal Uncle   . Heart disease Maternal Grandmother   . Diabetes Maternal Grandmother   . Hypertension Maternal Grandmother   . Heart disease Maternal Grandfather   . Diabetes Maternal Grandfather   . Hypertension Maternal Grandfather   . Hypertension Paternal Grandmother   . Hypotension Neg Hx   . Anesthesia problems Neg Hx   . Malignant hyperthermia Neg Hx   .  Pseudochol deficiency Neg Hx   . Cancer Neg Hx     Social History   Socioeconomic History  . Marital status: Divorced    Spouse name: Not on file  . Number of children: 3  . Years of education: 48   . Highest education level: Not on file  Occupational History    Employer: [employer]  Tobacco Use  . Smoking status: Never Smoker  . Smokeless tobacco: Never Used  Substance and Sexual Activity  . Alcohol use: No    Alcohol/week: 0.0 standard drinks  . Drug use: No  . Sexual activity: Yes    Birth control/protection: None, Surgical  Other Topics Concern  . Not on file  Social History Narrative   Lives with 3 children.   Immediate family in [place].    Born in [place].   Raised in [place], [place].     Lives in a 2 story home.   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 Difficulty of Paying Living Expenses: Not on file  Food Insecurity:   . Worried About Programme researcher, broadcasting/film/video in the Last Year: Not on file  . Ran Out of Food in the Last Year: Not on file  Transportation Needs:   . Lack of Transportation (Medical): Not on file  . Lack of Transportation (Non-Medical): Not on file  Physical Activity:   . Days of Exercise per Week: Not on file  . Minutes of Exercise per Session: Not on file  Stress:   . Feeling of Stress : Not on file  Social Connections:   . Frequency of Communication with Friends and Family: Not on file  . Frequency of Social Gatherings with Friends and Family: Not on file  . Attends Religious Services: Not on file  . Active Member of Clubs or Organizations: Not on file  . Attends Banker Meetings: Not on file  . Marital Status: Not on file  Intimate Partner Violence:   . Fear of Current or Ex-Partner: Not on file  . Emotionally Abused: Not on file  . Physically Abused: Not on file  . Sexually Abused: Not on file    Outpatient Medications Prior to Visit  Medication Sig Dispense Refill  . acetaminophen (TYLENOL) 500 MG tablet Take 500-1,000 mg by mouth every 6 (six) hours as needed for mild pain, moderate pain or headache (or migraines).     . diphenhydrAMINE (BENADRYL) 25 mg capsule Take 25-50 mg by mouth 2 (two) times daily as needed for allergies.    . ferrous sulfate 324 (65 Fe) MG TBEC 1 pill daily after meal to help with anemia 30 tablet 3  . fexofenadine (ALLEGRA) 180 MG tablet Take 180 mg by mouth daily as needed for allergies or rhinitis.     . fluticasone (FLONASE) 50 MCG/ACT nasal spray Place 1 spray into both nostrils daily.    Marland Kitchen HYDROcodone-acetaminophen (NORCO) 5-325 MG tablet Take 1 tablet by mouth every 6 (six) hours as needed for moderate pain. 8  tablet 0  . ibuprofen (ADVIL) 600 MG tablet Take 1 tablet (600 mg total) by mouth every 6 (six) hours as needed. 30 tablet 0  . SUMAtriptan (IMITREX) 50 MG tablet Take 1 tablet (50 mg total) by mouth once as needed (at the onset of a migraine- may repeat once in 2 hours if headache persists or recurs). 10 tablet 1  .  topiramate (TOPAMAX) 50 MG tablet Take 1 tablet (50 mg total) by mouth 2 (two) times daily. 60 tablet 3   No facility-administered medications prior to visit.    Allergies  Allergen Reactions  . Other Itching    "20 different types of trees"  . Latex Hives and Itching  . Tree Extract Itching  . Zofran Itching    ROS Review of Systems  Constitutional: Positive for fatigue. Negative for chills and fever.  HENT: Negative for sore throat and trouble swallowing.   Eyes: Negative for photophobia and visual disturbance.  Respiratory: Negative for cough and shortness of breath.   Cardiovascular: Positive for chest pain (Substernal burning sensation similar to discomfort with past issues with acid reflux). Negative for palpitations and leg swelling.  Gastrointestinal: Positive for abdominal distention and abdominal pain. Negative for blood in stool, constipation, diarrhea, nausea and vomiting.  Endocrine: Negative for polydipsia, polyphagia and polyuria.  Genitourinary: Negative for dysuria, flank pain, frequency and hematuria.  Musculoskeletal: Negative for arthralgias and back pain.  Neurological: Negative for dizziness and headaches.  Hematological: Negative for adenopathy. Does not bruise/bleed easily.  Psychiatric/Behavioral: Positive for sleep disturbance (Difficulty sleeping due to abdominal pain). Negative for self-injury and suicidal ideas.      Objective:    Physical Exam  Constitutional: She appears well-developed and well-nourished.  Neck: No JVD present.  Cardiovascular: Normal rate and regular rhythm.  Pulmonary/Chest: Effort normal and breath sounds normal.    Abdominal: Soft. She exhibits distension. There is abdominal tenderness (Epigastric and generalized abdominal discomfort to palpation). There is guarding (Voluntary guarding with palpation of the epigastric area). There is no rebound.  Musculoskeletal:        General: No tenderness or edema.     Cervical back: Normal range of motion and neck supple.  Lymphadenopathy:    She has no cervical adenopathy.  Neurological: She is alert.  Skin: Skin is warm and dry.  Psychiatric:  Patient is very anxious about her abdominal pain  Nursing note and vitals reviewed.   BP 116/75 (BP Location: Left Arm, Patient Position: Sitting, Cuff Size: Large)   Pulse 80   Temp (!) 97.2 F (36.2 C) (Oral)   Ht 5\' 2"  (1.575 m)   Wt 271 lb (122.9 kg)   SpO2 99%   BMI 49.57 kg/m  Wt Readings from Last 3 Encounters:  01/02/19 266 lb (120.7 kg)  11/22/18 260 lb 6.4 oz (118.1 kg)  06/06/18 239 lb (108.4 kg)       Lab Results  Component Value Date   TSH 0.479 01/21/2015   Lab Results  Component Value Date   WBC 9.6 11/10/2018   HGB 10.9 (L) 11/10/2018   HCT 36.5 11/10/2018   MCV 81.3 11/10/2018   PLT 352 11/10/2018   Lab Results  Component Value Date   NA 137 11/10/2018   K 3.7 11/10/2018   CO2 24 11/10/2018   GLUCOSE 96 11/10/2018   BUN 8 11/10/2018   CREATININE 0.82 11/10/2018   BILITOT 0.3 09/11/2018   ALKPHOS 77 09/11/2018   AST 16 09/11/2018   ALT 15 09/11/2018   PROT 7.0 09/11/2018   ALBUMIN 3.2 (L) 09/11/2018   CALCIUM 8.8 (L) 11/10/2018   ANIONGAP 8 11/10/2018   No results found for: CHOL No results found for: HDL No results found for: LDLCALC No results found for: TRIG No results found for: [facility] Lab Results  Component Value Date   HGBA1C 5.7 (A) 11/22/2018  Assessment & Plan:  1. Epigastric pain; 4.  GERD Patient with epigastric pain on examination and symptoms suggestive of acid reflux.  She will be placed on omeprazole 40 mg twice daily.  Discussed  avoidance of known trigger foods, avoidance of nonsteroidal anti-inflammatories and avoidance of eating within 2 hours of bedtime.  Patient will also have antibody testing for H. pylori, CBC to look for anemia/blood loss, comprehensive metabolic panel to look for electrolyte or liver abnormality as well as lipase to look for pancreatic abnormality secondary to her epigastric pain.  She is being referred to gastroenterology for further evaluation and treatment. - omeprazole (PRILOSEC) 40 MG capsule; Take 1 capsule (40 mg total) by mouth 2 (two) times daily. To reduce stomach acid  Dispense: 60 capsule; Refill: 3 - CBC - Helicobacter pylori abs-IgG+IgA, bld - Comprehensive metabolic panel - Ambulatory referral to Gastroenterology - Lipase  2. Generalized abdominal pain Patient has been placed on omeprazole 40 mg twice daily for acid reflux, Bentyl to help with abdominal cramping and she has been referred to gastroenterology for further evaluation and treatment.  She will also have blood work including CBC, H. pylori antibodies and comprehensive metabolic panel panel to look for other causes of her abdominal pain. - omeprazole (PRILOSEC) 40 MG capsule; Take 1 capsule (40 mg total) by mouth 2 (two) times daily. To reduce stomach acid  Dispense: 60 capsule; Refill: 3 - dicyclomine (BENTYL) 10 MG capsule; Take 1 capsule (10 mg total) by mouth 4 (four) times daily -  before meals and at bedtime. For stomach cramping  Dispense: 120 capsule; Refill: 1 - CBC - Helicobacter pylori abs-IgG+IgA, bld - Comprehensive metabolic panel - Ambulatory referral to Gastroenterology  3. Substernal chest pain Substernal chest pain which is most likely related to acid reflux.  Prescription for omeprazole 40 mg twice daily and will test for H. pylori, electrolyte abnormality, liver abnormality as well as anemia/blood abnormality. - omeprazole (PRILOSEC) 40 MG capsule; Take 1 capsule (40 mg total) by mouth 2 (two) times  daily. To reduce stomach acid  Dispense: 60 capsule; Refill: 3 - CBC - Helicobacter pylori abs-IgG+IgA, bld - Comprehensive metabolic panel - Ambulatory referral to Gastroenterology  5. Long term current use of non-steroidal anti-inflammatories (NSAID) Discussed with patient that she may be having an increase in epigastric pain, possible gastritis related to use of nonsteroidal anti-inflammatories for recent ankle pain.  She is advised to take Tylenol instead of nonsteroidal anti-inflammatory such as ibuprofen or Aleve.  She has been placed on medication for acid reflux and is being referred to gastroenterology for further evaluation and treatment.  Will check CBC to look for any anemia/blood loss or platelet disorder related to NSAID use. - CBC   An After Visit Summary was printed and given to the patient.  Follow-up: Return in about 2 weeks (around 06/27/2019) for stomach pain- ED if worsening pain/blood in the stool or black stools; 1-2 weeks with PCP; GI f/u.    Cain Saupe, MD

## 2019-06-15 ENCOUNTER — Other Ambulatory Visit: Payer: Self-pay | Admitting: Family Medicine

## 2019-06-15 ENCOUNTER — Telehealth: Payer: Self-pay

## 2019-06-15 DIAGNOSIS — R7689 Other specified abnormal immunological findings in serum: Secondary | ICD-10-CM

## 2019-06-15 DIAGNOSIS — R768 Other specified abnormal immunological findings in serum: Secondary | ICD-10-CM

## 2019-06-15 LAB — COMPREHENSIVE METABOLIC PANEL WITH GFR
ALT: 11 IU/L (ref 0–32)
AST: 14 IU/L (ref 0–40)
Albumin/Globulin Ratio: 1.2 (ref 1.2–2.2)
Albumin: 3.7 g/dL — ABNORMAL LOW (ref 3.8–4.8)
Alkaline Phosphatase: 80 IU/L (ref 39–117)
BUN/Creatinine Ratio: 11 (ref 9–23)
BUN: 10 mg/dL (ref 6–20)
Bilirubin Total: <0.2 mg/dL (ref 0.0–1.2)
CO2: 22 mmol/L (ref 20–29)
Calcium: 9 mg/dL (ref 8.7–10.2)
Chloride: 106 mmol/L (ref 96–106)
Creatinine, Ser: 0.91 mg/dL (ref 0.57–1.00)
GFR calc Af Amer: 97 mL/min/1.73
GFR calc non Af Amer: 84 mL/min/1.73
Globulin, Total: 3.2 g/dL (ref 1.5–4.5)
Glucose: 73 mg/dL (ref 65–99)
Potassium: 4.4 mmol/L (ref 3.5–5.2)
Sodium: 137 mmol/L (ref 134–144)
Total Protein: 6.9 g/dL (ref 6.0–8.5)

## 2019-06-15 LAB — LIPASE: Lipase: 21 U/L (ref 14–72)

## 2019-06-15 LAB — CBC
Hematocrit: 36.3 % (ref 34.0–46.6)
Hemoglobin: 11.6 g/dL (ref 11.1–15.9)
MCH: 25.7 pg — ABNORMAL LOW (ref 26.6–33.0)
MCHC: 32 g/dL (ref 31.5–35.7)
MCV: 80 fL (ref 79–97)
Platelets: 354 x10E3/uL (ref 150–450)
RBC: 4.52 x10E6/uL (ref 3.77–5.28)
RDW: 13.9 % (ref 11.7–15.4)
WBC: 8.7 x10E3/uL (ref 3.4–10.8)

## 2019-06-15 LAB — HELICOBACTER PYLORI ABS-IGG+IGA, BLD
H. pylori, IgA Abs: 18.5 U — ABNORMAL HIGH (ref 0.0–8.9)
H. pylori, IgG AbS: 5.19 {index_val} — ABNORMAL HIGH (ref 0.00–0.79)

## 2019-06-15 MED ORDER — CLARITHROMYCIN 500 MG PO TABS: 500.0000 mg | ORAL_TABLET | Freq: Two times a day (BID) | ORAL | 0 refills | Status: AC

## 2019-06-15 MED ORDER — AMOXICILLIN 500 MG PO CAPS: 1000.0000 mg | ORAL_CAPSULE | Freq: Two times a day (BID) | ORAL | 0 refills | Status: AC

## 2019-06-15 NOTE — Telephone Encounter (Signed)
-----   Message from Cain Saupe, MD sent at 06/15/2019  2:43 PM EST ----- Please notify patient that her test was positive for antibodies to a bacteria that can cause stomach ulcers. She will need to take two antibiotics in addition to the recently prescribed reflux medication. One of the medications, the clarithromycin can cause a strange metallic taste in the mouth but this is normal for this medication. Prescriptions will be sent to pharmacy for treatment

## 2019-06-15 NOTE — Telephone Encounter (Signed)
Patient was called and informed of lab results and medication being sent to pharmacy. 

## 2019-06-27 ENCOUNTER — Other Ambulatory Visit: Payer: Self-pay

## 2019-06-27 ENCOUNTER — Encounter: Payer: Self-pay | Admitting: Family Medicine

## 2019-06-27 ENCOUNTER — Ambulatory Visit: Payer: Medicaid Other | Attending: Family Medicine | Admitting: Family Medicine

## 2019-06-27 DIAGNOSIS — F32A Depression, unspecified: Secondary | ICD-10-CM

## 2019-06-27 DIAGNOSIS — F419 Anxiety disorder, unspecified: Secondary | ICD-10-CM | POA: Diagnosis not present

## 2019-06-27 DIAGNOSIS — F329 Major depressive disorder, single episode, unspecified: Secondary | ICD-10-CM

## 2019-06-27 DIAGNOSIS — K219 Gastro-esophageal reflux disease without esophagitis: Secondary | ICD-10-CM

## 2019-06-27 DIAGNOSIS — R768 Other specified abnormal immunological findings in serum: Secondary | ICD-10-CM

## 2019-06-27 DIAGNOSIS — R7689 Other specified abnormal immunological findings in serum: Secondary | ICD-10-CM

## 2019-06-27 DIAGNOSIS — Z789 Other specified health status: Secondary | ICD-10-CM

## 2019-06-27 MED ORDER — ESCITALOPRAM OXALATE 10 MG PO TABS: 10.0000 mg | ORAL_TABLET | Freq: Every day | ORAL | 1 refills | Status: DC

## 2019-06-27 NOTE — Progress Notes (Signed)
Virtual Visit via Telephone Note  I connected with Laurie Reed on 06/27/19 at  3:10 PM EST by telephone and verified that I am speaking with the correct person using two identifiers.   I discussed the limitations, risks, security and privacy concerns of performing an evaluation and management service by telephone and the availability of in person appointments. I also discussed with the patient that there may be a patient responsible charge related to this service. The patient expressed understanding and agreed to proceed.  Patient Location: Home Provider Location: [facility] Others participating in call: none   History of Present Illness:         34 year old female seen in follow-up of recent visit at which time she had complaints of epigastric abdominal pain that was somewhat severe in nature.  Patient had positive antibody blood test for H. pylori.  She reports that she is currently finishing up the antibiotic therapy for H. pylori and her symptoms have completely resolved.  She also continues to take medication to decrease stomach acid.  She denies any nausea/vomiting/diarrhea or constipation, no blood in the stool or black stools.  Her appetite is returned to normal.           She reports that she would like to be back on medication to help with anxiety and depression.  She reports she took medicine in the past but does not recall the name.  She currently has a 68-year-old in addition to other children and sometimes feels overwhelmed.  She states that she finds her self raising her voice or yelling at her children but they have really not done anything to warrant being yelled at.  She finds that she gets irritated easily.  She also has some difficulty with sleep and also has some anxiety and occasional tearfulness for no reason.  On review of her past medications, she believes that she was prescribed Wellbutrin/bupropion by another provider to help with weight loss.  She does not recall  being on Cymbalta.  Discussed patient's symptoms and recommended Lexapro/escitalopram which patient recognized as the medication she had taken in the past which did seem to help.  She does continue as well to feel concerned about her weight.  She did not actually have any significant weight loss with prior use of Wellbutrin when this was prescribed in the past.  Lexapro however did improve her mood.           Past Medical History:  Diagnosis Date  . Acid reflux 2004  . Anemia   . Anxiety 2008  . Asthma 1993   no previous intubations or hospitalizations   . Chlamydia   . Depression 2008   no meds, currenly ok  . Environmental allergies   . Migraines   . Miscarriage   . Ovarian cyst   . Pre-diabetes   . Ulcer of the stomach and intestine 2004  . Urinary tract infection     Past Surgical History:  Procedure Laterality Date  . COLONOSCOPY  2004   . IRRIGATION AND DEBRIDEMENT SEBACEOUS CYST N/A 06/01/2017   Procedure: EXCISION SEBACEOUS CYST ON SCALP;  Surgeon: Vickie Epley, MD;  Location: [facility];  Service: General;  Laterality: N/A;  . MULTIPLE TOOTH EXTRACTIONS  2015   6 teeth removed   . TUBAL LIGATION N/A 04/04/2018   Procedure: POST PARTUM TUBAL LIGATION;  Surgeon: Truett Mainland, DO;  Location: [facility];  Service: Gynecology;  Laterality: N/A;  . WISDOM TOOTH EXTRACTION  2007    Family History  Problem Relation Age of Onset  . Asthma Mother   . Heart disease Mother   . Hypertension Mother   . Asthma Maternal Aunt   . Hypertension Maternal Aunt   . Diabetes Maternal Aunt   . Asthma Maternal Uncle   . Hypertension Maternal Uncle   . Diabetes Maternal Uncle   . Heart disease Maternal Grandmother   . Diabetes Maternal Grandmother   . Hypertension Maternal Grandmother   . Heart disease Maternal Grandfather   . Diabetes Maternal Grandfather   . Hypertension Maternal Grandfather   . Hypertension Paternal Grandmother   . Hypotension Neg Hx   . Anesthesia  problems Neg Hx   . Malignant hyperthermia Neg Hx   . Pseudochol deficiency Neg Hx   . Cancer Neg Hx     Social History   Tobacco Use  . Smoking status: Never Smoker  . Smokeless tobacco: Never Used  Substance Use Topics  . Alcohol use: No    Alcohol/week: 0.0 standard drinks  . Drug use: No     Allergies  Allergen Reactions  . Other Itching    "20 different types of trees"  . Latex Hives and Itching  . Tree Extract Itching  . Zofran Itching       Observations/Objective: No vital signs or physical exam conducted as visit was done via telephone  Assessment and Plan: 1. Gastroesophageal reflux disease, unspecified whether esophagitis present 2. Helicobacter pylori antibody positive She reports that her epigastric and other abdominal pain have completely resolved now that she is on therapy for H. pylori which she is about to complete.  She is encouraged to continue omeprazole 40 mg twice daily.  Avoid known trigger foods and avoid late night eating.  3. Anxiety and depression 4. Need for follow-up by social worker She agrees to restart Lexapro at 10 mg daily.  She denies any suicidal thoughts or ideations.  No intention of self-harm or harm to others.  She is not currently breast-feeding.  She agrees to be contacted by the Child psychotherapist regarding need for counseling/follow-up of initiation of medication.  She reports that she has taken Lexapro in the past with good results.  She was made aware that it may take 4 to 6 weeks for her to feel as well on the medication as she is going to feel and she tried to take the medication until her follow-up appointment but should call or return sooner if she has any difficulty with the medication.  If she has any acute worsening of her depression/anxiety or any suicidal thoughts or ideations she is aware that she should go to the emergency department for further evaluation and treatment. - Ambulatory referral to Social Work  Follow Up  Instructions:Return in about 4 weeks (around 07/25/2019) for Anxiety and depression/new medication.    I discussed the assessment and treatment plan with the patient. The patient was provided an opportunity to ask questions and all were answered. The patient agreed with the plan and demonstrated an understanding of the instructions.   The patient was advised to call back or seek an in-person evaluation if the symptoms worsen or if the condition fails to improve as anticipated.  I provided 9 minutes of non-face-to-face time during this encounter.   Cain Saupe, MD

## 2019-07-23 NOTE — Progress Notes (Deleted)
Referring Provider: Antony Blackbird, MD Primary Care Physician:  Antony Blackbird, MD  Reason for Consultation: Gastric pain, abdominal pain, chest pain   IMPRESSION:  ***  PLAN: ***  Please see the "Patient Instructions" section for addition details about the plan.  HPI: Tema Alire is a 34 y.o. female she has anxiety and depression.  Developed abdominal pain in January 2021. Liver enzymes and lipase were normal 06/13/2019 H pylori IgA and IgG positive blood test.  Treated with antibiotics with resolution of her symptoms.  No known family history of colon cancer or polyps. No family history of uterine/endometrial cancer, pancreatic cancer or gastric/stomach cancer.   Past Medical History:  Diagnosis Date  . Acid reflux 2004  . Anemia   . Anxiety 2008  . Asthma 1993   no previous intubations or hospitalizations   . Chlamydia   . Depression 2008   no meds, currenly ok  . Environmental allergies   . Migraines   . Miscarriage   . Ovarian cyst   . Pre-diabetes   . Ulcer of the stomach and intestine 2004  . Urinary tract infection     Past Surgical History:  Procedure Laterality Date  . COLONOSCOPY  2004   . IRRIGATION AND DEBRIDEMENT SEBACEOUS CYST N/A 06/01/2017   Procedure: EXCISION SEBACEOUS CYST ON SCALP;  Surgeon: Vickie Epley, MD;  Location: [facility];  Service: General;  Laterality: N/A;  . MULTIPLE TOOTH EXTRACTIONS  2015   6 teeth removed   . TUBAL LIGATION N/A 04/04/2018   Procedure: POST PARTUM TUBAL LIGATION;  Surgeon: Truett Mainland, DO;  Location: [facility];  Service: Gynecology;  Laterality: N/A;  . WISDOM TOOTH EXTRACTION  2007    Current Outpatient Medications  Medication Sig Dispense Refill  . acetaminophen (TYLENOL) 500 MG tablet Take 500-1,000 mg by mouth every 6 (six) hours as needed for mild pain, moderate pain or headache (or migraines).     Marland Kitchen dicyclomine (BENTYL) 10 MG capsule Take 1 capsule (10 mg total) by mouth 4 (four)  times daily -  before meals and at bedtime. For stomach cramping 120 capsule 1  . diphenhydrAMINE (BENADRYL) 25 mg capsule Take 25-50 mg by mouth 2 (two) times daily as needed for allergies.    Marland Kitchen escitalopram (LEXAPRO) 10 MG tablet Take 1 tablet (10 mg total) by mouth daily. 30 tablet 1  . ferrous sulfate 324 (65 Fe) MG TBEC 1 pill daily after meal to help with anemia 30 tablet 3  . fexofenadine (ALLEGRA) 180 MG tablet Take 180 mg by mouth daily as needed for allergies or rhinitis.     . fluticasone (FLONASE) 50 MCG/ACT nasal spray Place 1 spray into both nostrils daily.    Marland Kitchen HYDROcodone-acetaminophen (NORCO) 5-325 MG tablet Take 1 tablet by mouth every 6 (six) hours as needed for moderate pain. (Patient not taking: Reported on 06/13/2019) 8 tablet 0  . ibuprofen (ADVIL) 600 MG tablet Take 1 tablet (600 mg total) by mouth every 6 (six) hours as needed. 30 tablet 0  . omeprazole (PRILOSEC) 40 MG capsule Take 1 capsule (40 mg total) by mouth 2 (two) times daily. To reduce stomach acid 60 capsule 3  . sucralfate (CARAFATE) 1 GM/10ML suspension Take 10 mLs (1 g total) by mouth 4 (four) times daily -  with meals and at bedtime. 420 mL 0  . SUMAtriptan (IMITREX) 50 MG tablet Take 1 tablet (50 mg total) by mouth once as needed (at the onset of a  migraine- may repeat once in 2 hours if headache persists or recurs). 10 tablet 1  . topiramate (TOPAMAX) 50 MG tablet Take 1 tablet (50 mg total) by mouth 2 (two) times daily. 60 tablet 3   No current facility-administered medications for this visit.    Allergies as of 07/25/2019 - Review Complete 06/27/2019  Allergen Reaction Noted  . Other Itching 11/06/2011  . Latex Hives and Itching 04/09/2011  . Tree extract Itching 11/10/2018  . Zofran Itching 04/09/2011    Family History  Problem Relation Age of Onset  . Asthma Mother   . Heart disease Mother   . Hypertension Mother   . Asthma Maternal Aunt   . Hypertension Maternal Aunt   . Diabetes Maternal  Aunt   . Asthma Maternal Uncle   . Hypertension Maternal Uncle   . Diabetes Maternal Uncle   . Heart disease Maternal Grandmother   . Diabetes Maternal Grandmother   . Hypertension Maternal Grandmother   . Heart disease Maternal Grandfather   . Diabetes Maternal Grandfather   . Hypertension Maternal Grandfather   . Hypertension Paternal Grandmother   . Hypotension Neg Hx   . Anesthesia problems Neg Hx   . Malignant hyperthermia Neg Hx   . Pseudochol deficiency Neg Hx   . Cancer Neg Hx     Social History   Socioeconomic History  . Marital status: Divorced    Spouse name: Not on file  . Number of children: 3  . Years of education: 62   . Highest education level: Not on file  Occupational History    Employer: [employer]  Tobacco Use  . Smoking status: Never Smoker  . Smokeless tobacco: Never Used  Substance and Sexual Activity  . Alcohol use: No    Alcohol/week: 0.0 standard drinks  . Drug use: No  . Sexual activity: Yes    Birth control/protection: None, Surgical  Other Topics Concern  . Not on file  Social History Narrative   Lives with 3 children.   Immediate family in [place].    Born in [place].   Raised in [place], [place].    Lives in a 2 story home.   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 Difficulty of Paying Living Expenses: Not on file  Food Insecurity:   . Worried About Programme researcher, broadcasting/film/video in the Last Year: Not on file  . Ran Out of Food in the Last Year: Not on file  Transportation Needs:   . Lack of Transportation (Medical): Not on file  . Lack of Transportation (Non-Medical): Not on file  Physical Activity:   . Days of Exercise per Week: Not on file  . Minutes of Exercise per Session: Not on file  Stress:   . Feeling of Stress : Not on file  Social Connections:   . Frequency of Communication with Friends and Family: Not on file  . Frequency of Social  Gatherings with Friends and Family: Not on file  . Attends Religious Services: Not on file  . Active Member of Clubs or Organizations: Not on file  . Attends Banker Meetings: Not on file  . Marital Status: Not on file  Intimate Partner Violence:   . Fear of Current or Ex-Partner: Not on file  . Emotionally Abused: Not on file  . Physically Abused: Not on file  . Sexually Abused: Not on  file    Review of Systems: 12 system ROS is negative except as noted above.   Physical Exam: General:   Alert,  well-nourished, pleasant and cooperative in NAD Head:  Normocephalic and atraumatic. Eyes:  Sclera clear, no icterus.   Conjunctiva pink. Ears:  Normal auditory acuity. Nose:  No deformity, discharge,  or lesions. Mouth:  No deformity or lesions.   Neck:  Supple; no masses or thyromegaly. Lungs:  Clear throughout to auscultation.   No wheezes. Heart:  Regular rate and rhythm; no murmurs. Abdomen:  Soft,nontender, nondistended, normal bowel sounds, no rebound or guarding. No hepatosplenomegaly.   Rectal:  Deferred  Msk:  Symmetrical. No boney deformities LAD: No inguinal or umbilical LAD Extremities:  No clubbing or edema. Neurologic:  Alert and  oriented x4;  grossly nonfocal Skin:  Intact without significant lesions or rashes. Psych:  Alert and cooperative. Normal mood and affect.    Shanautica Forker L. Orvan Falconer, MD, MPH 07/23/2019, 5:19 PM

## 2019-07-25 ENCOUNTER — Ambulatory Visit: Payer: Medicaid Other | Admitting: Gastroenterology

## 2019-08-20 ENCOUNTER — Institutional Professional Consult (permissible substitution): Payer: Medicaid Other | Admitting: Licensed Clinical Social Worker

## 2019-08-29 ENCOUNTER — Ambulatory Visit: Payer: Medicaid Other | Admitting: Gastroenterology

## 2019-08-29 ENCOUNTER — Other Ambulatory Visit: Payer: Self-pay

## 2019-08-29 ENCOUNTER — Ambulatory Visit: Payer: Medicaid Other | Attending: Family Medicine | Admitting: Physician Assistant

## 2019-08-29 DIAGNOSIS — Z6841 Body Mass Index (BMI) 40.0 and over, adult: Secondary | ICD-10-CM

## 2019-08-29 DIAGNOSIS — F329 Major depressive disorder, single episode, unspecified: Secondary | ICD-10-CM

## 2019-08-29 DIAGNOSIS — G479 Sleep disorder, unspecified: Secondary | ICD-10-CM

## 2019-08-29 DIAGNOSIS — G8929 Other chronic pain: Secondary | ICD-10-CM

## 2019-08-29 DIAGNOSIS — Z789 Other specified health status: Secondary | ICD-10-CM

## 2019-08-29 DIAGNOSIS — F419 Anxiety disorder, unspecified: Secondary | ICD-10-CM

## 2019-08-29 DIAGNOSIS — M25572 Pain in left ankle and joints of left foot: Secondary | ICD-10-CM

## 2019-08-29 DIAGNOSIS — F32A Depression, unspecified: Secondary | ICD-10-CM

## 2019-08-29 NOTE — Progress Notes (Signed)
Sleep study and left foot and ankle pain.

## 2019-08-29 NOTE — Progress Notes (Signed)
Virtual Visit via Telephone Note  I connected with Laurie Reed on 08/29/19 at  9:10 AM EDT by telephone and verified that I am speaking with the correct person using two identifiers.   I discussed the limitations, risks, security and privacy concerns of performing an evaluation and management service by telephone and the availability of in person appointments. I also discussed with the patient that there may be a patient responsible charge related to this service. The patient expressed understanding and agreed to proceed.  PATIENT visit by telephone virtually in the context of Covid-19 pandemic. Patient location:  home My Location:  [facility] office Persons on the call:  Me and the patient  History of Present Illness: Patient is having trouble after re-injuring her L ankle/foot.  She is requesting a handicap placard, however, she has not followed up/followed directions of ortho regarding therapy/f/up treatment.    She is also wanting a sleep study.  She snores and is restless while sleeping.    She admits to anxiety/depression.  Worries a lot.  Feels anxious a lot.  Overeats.    Observations/Objective:  NAD.  A&Ox3   Assessment and Plan: 1. Chronic pain of left ankle F/up with ortho and obtain placard from them if deemed necessary  2. Anxiety and depression -counseled on self-care - Ambulatory referral to Social Work  3. Need for follow-up by social worker - Ambulatory referral to Social Work  4. Morbid obesity with BMI of 45.0-49.9, adult (HCC) - Split night study; Future  5. Restless sleeper - Split night study; Future  Follow Up Instructions: See PCP in 2 months;  Sooner if needed   I discussed the assessment and treatment plan with the patient. The patient was provided an opportunity to ask questions and all were answered. The patient agreed with the plan and demonstrated an understanding of the instructions.   The patient was advised to call back or seek an in-person  evaluation if the symptoms worsen or if the condition fails to improve as anticipated.  I provided 14 minutes of non-face-to-face time during this encounter.   [place] Co, PA-C  Patient ID: Laurie Reed, female   DOB: 05-26-1985, 34 y.o.   MRN: [id number]

## 2019-09-04 IMAGING — CR CHEST - 2 VIEW
2 series · 2 of 2 positions shown · non-contrast
Comparison: Chest radiograph dated 06/06/2018

CLINICAL DATA: 32-year-old female with chest pain.

EXAM:
CHEST - 2 VIEW

[chest pa]
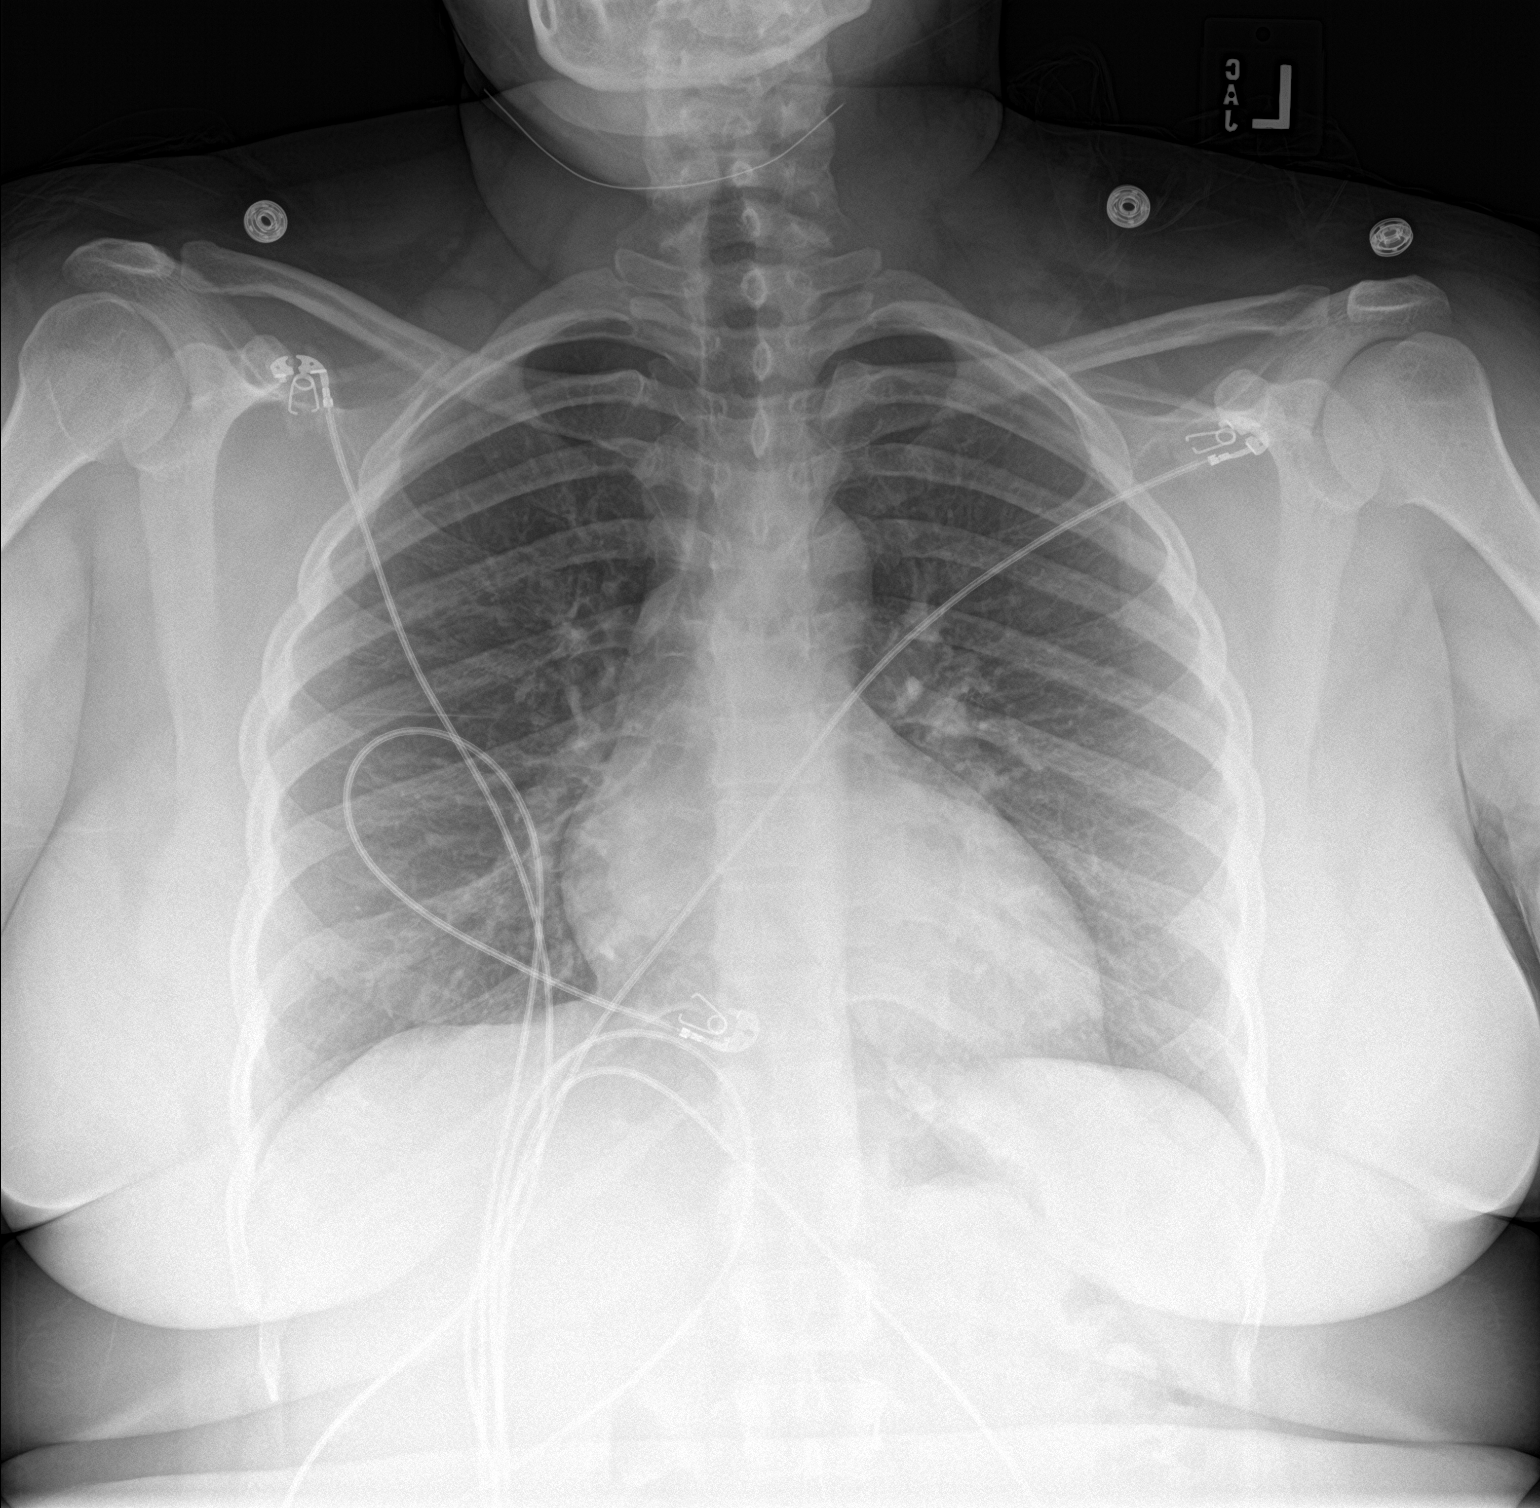

[chest lat]
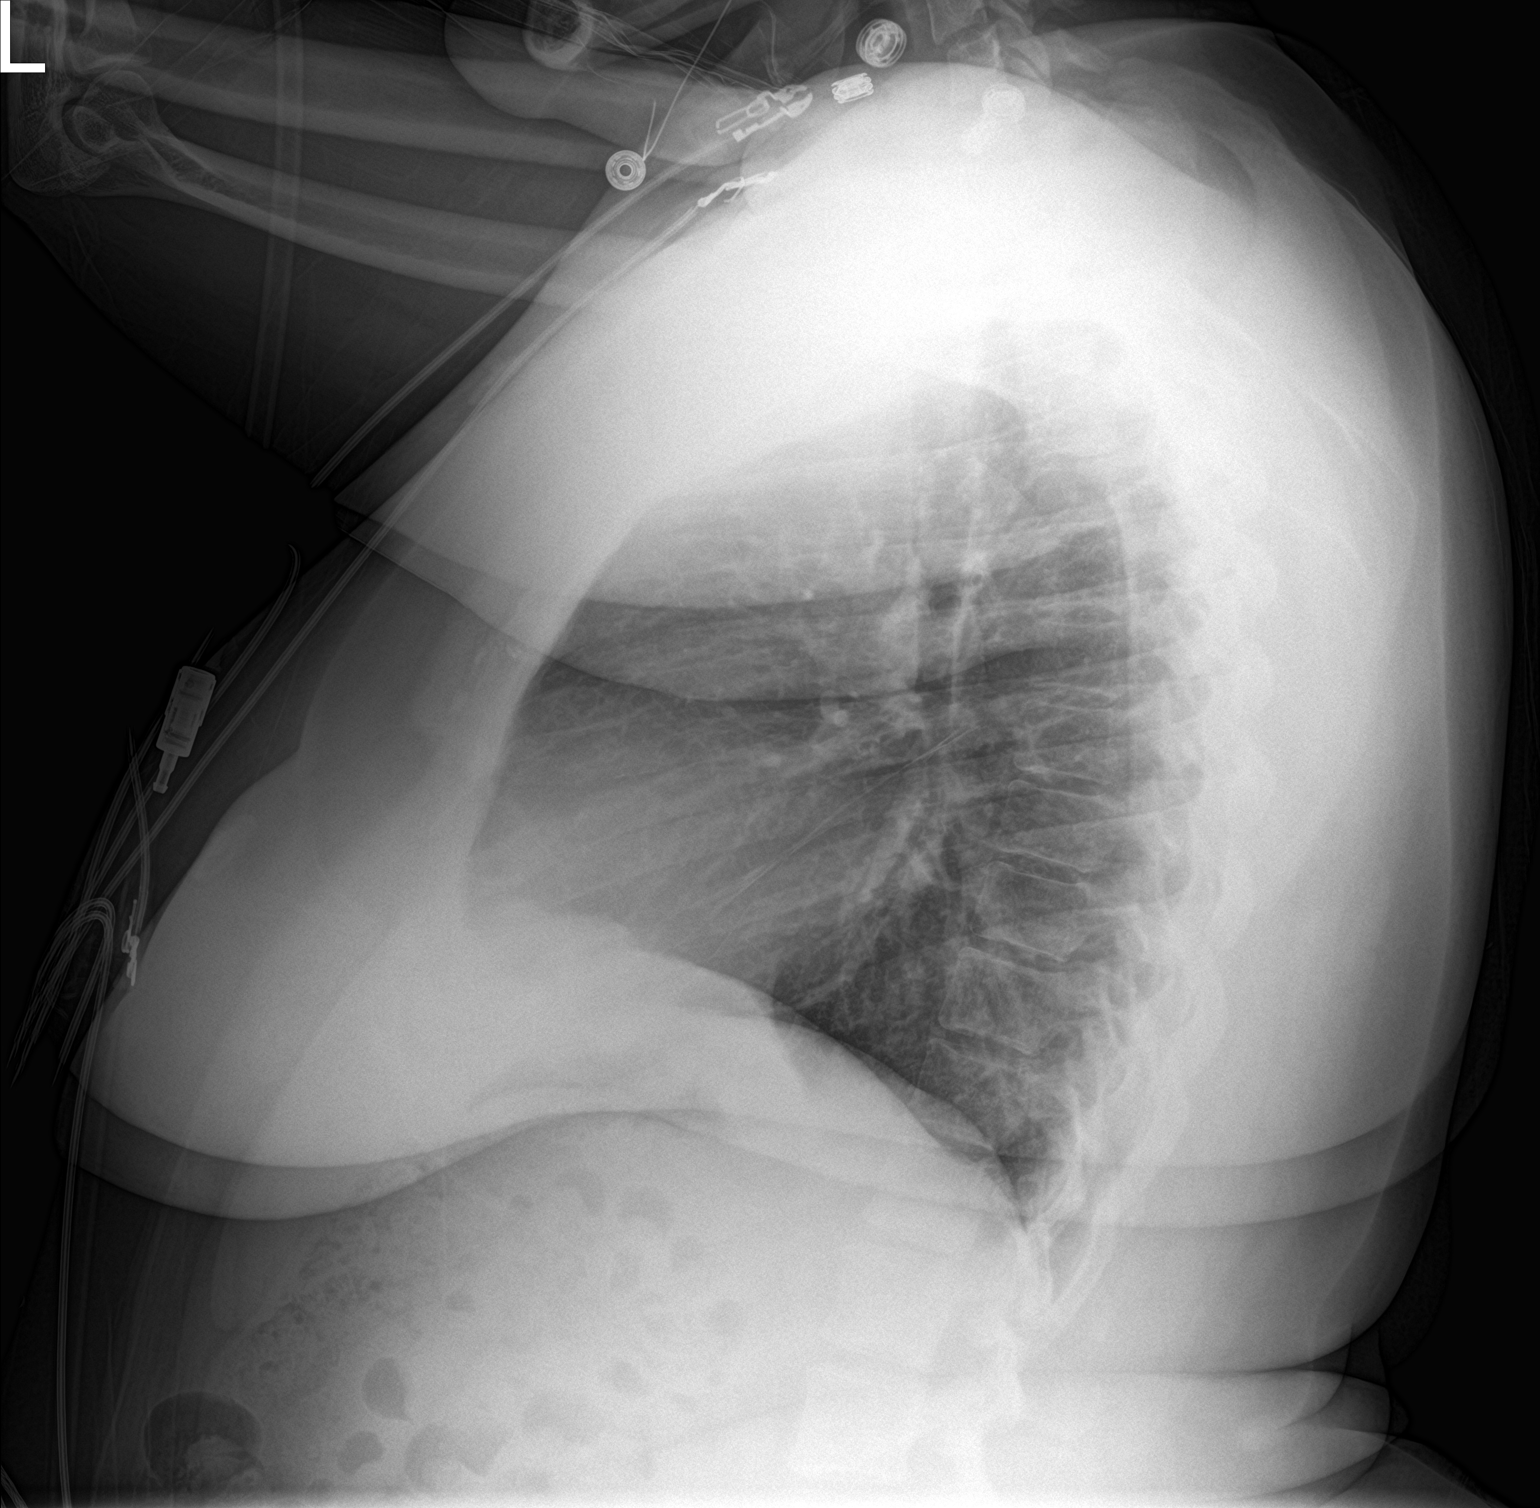

[2 of 2 positions shown; findings below may reference images not displayed]

FINDINGS: The heart size and mediastinal contours are within normal limits.
Both lungs are clear. The visualized skeletal structures are
unremarkable.
IMPRESSION: No active cardiopulmonary disease.

## 2019-09-05 ENCOUNTER — Telehealth: Payer: Self-pay | Admitting: Licensed Clinical Social Worker

## 2019-09-05 NOTE — Telephone Encounter (Signed)
Call placed to patient regarding [facility] referral placed by PCP and PA-McClung. Pt had an appointment scheduled for 08/20/19 noting patient no-showed to appt.   LCSW attempted to schedule another appointment; however, patient stated that she was unable to do so at the moment.   Pt appears not to be interested in initiating behavioral health services and/or resources at this time. Pt is encouraged to schedule appointment with LCSW when available.

## 2019-09-09 IMAGING — DX CHEST - 2 VIEW
2 series · 2 of 2 positions shown · non-contrast
Comparison: 11/05/2018

CLINICAL DATA: Chest pain for greater than 1 week.

EXAM:
CHEST - 2 VIEW

[chest pa]
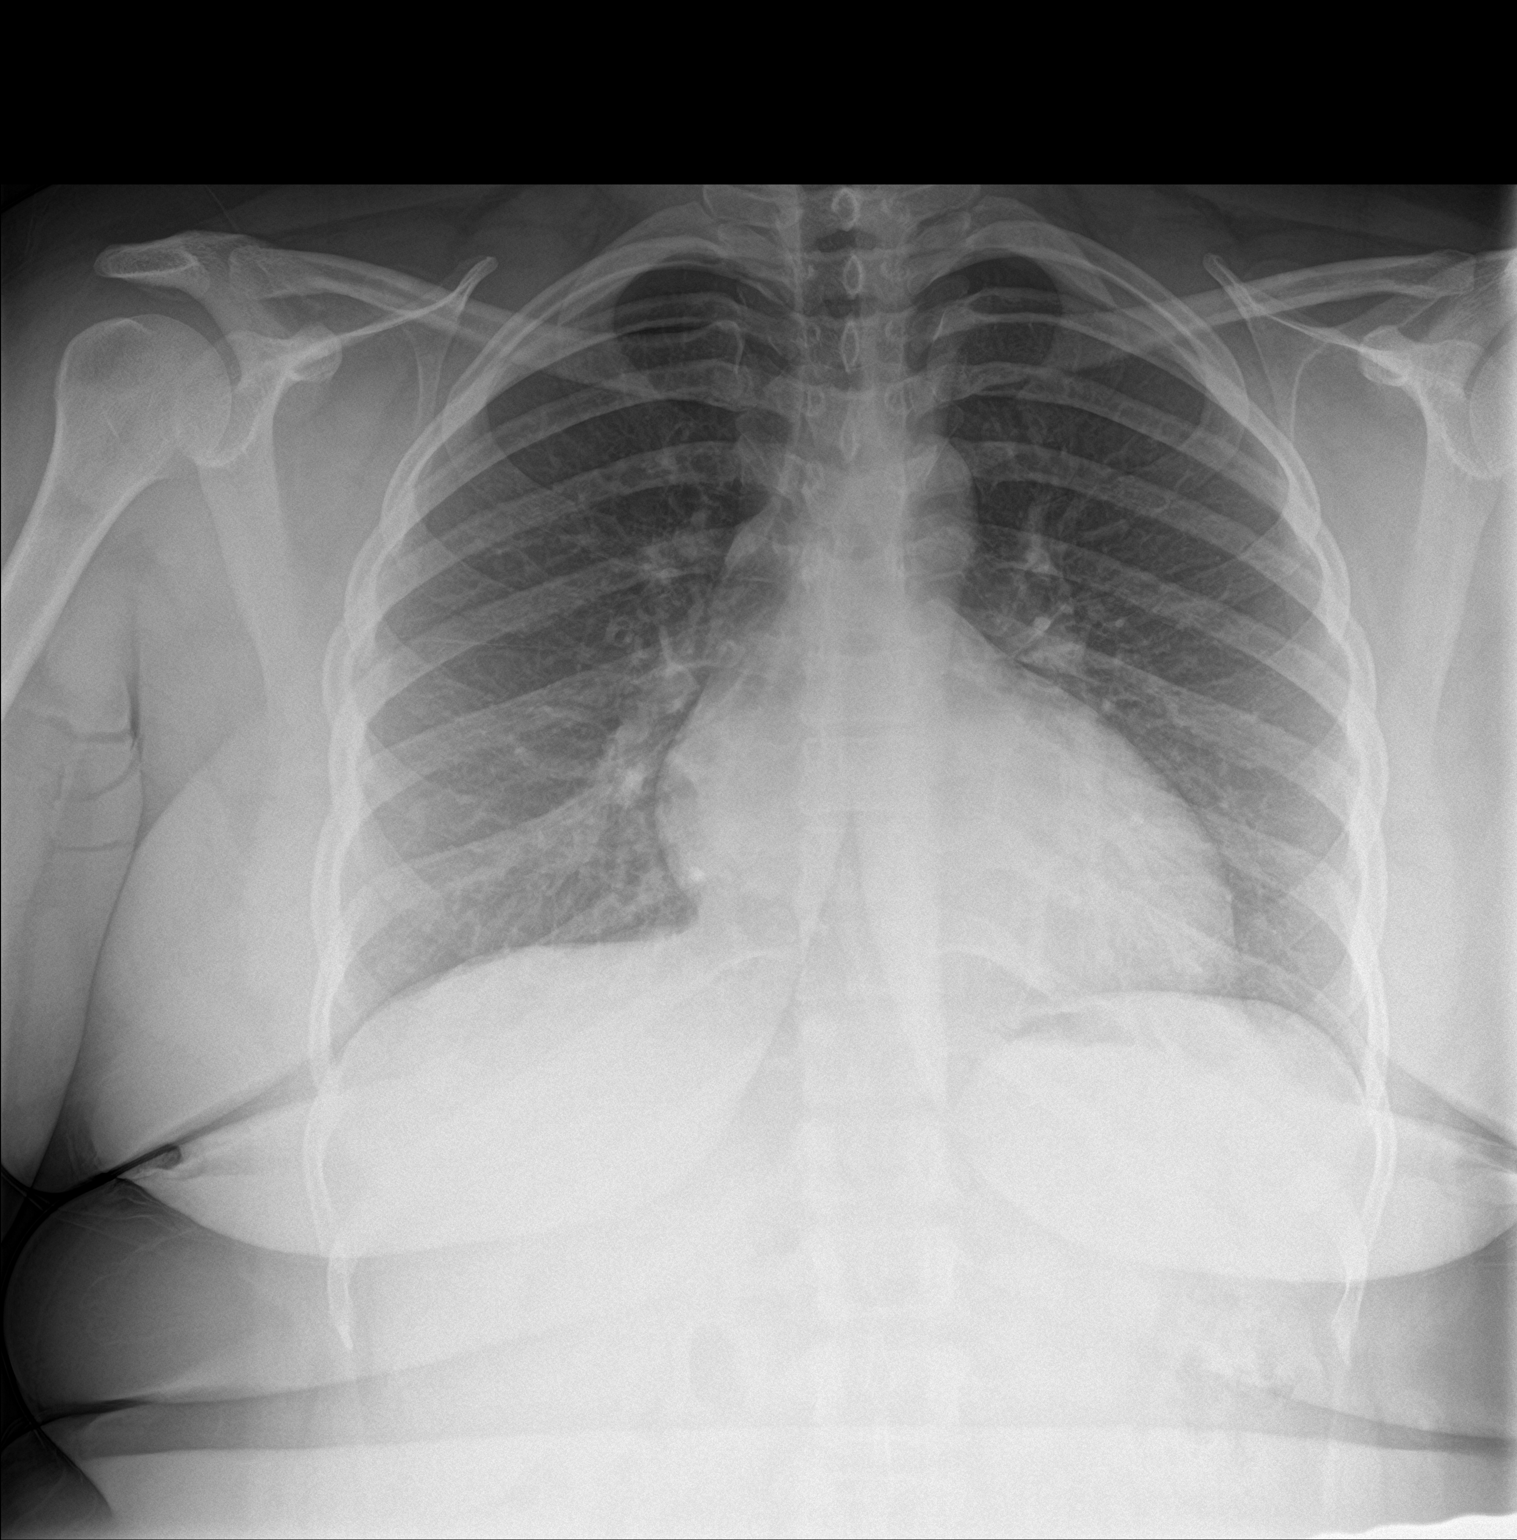

[chest lat]
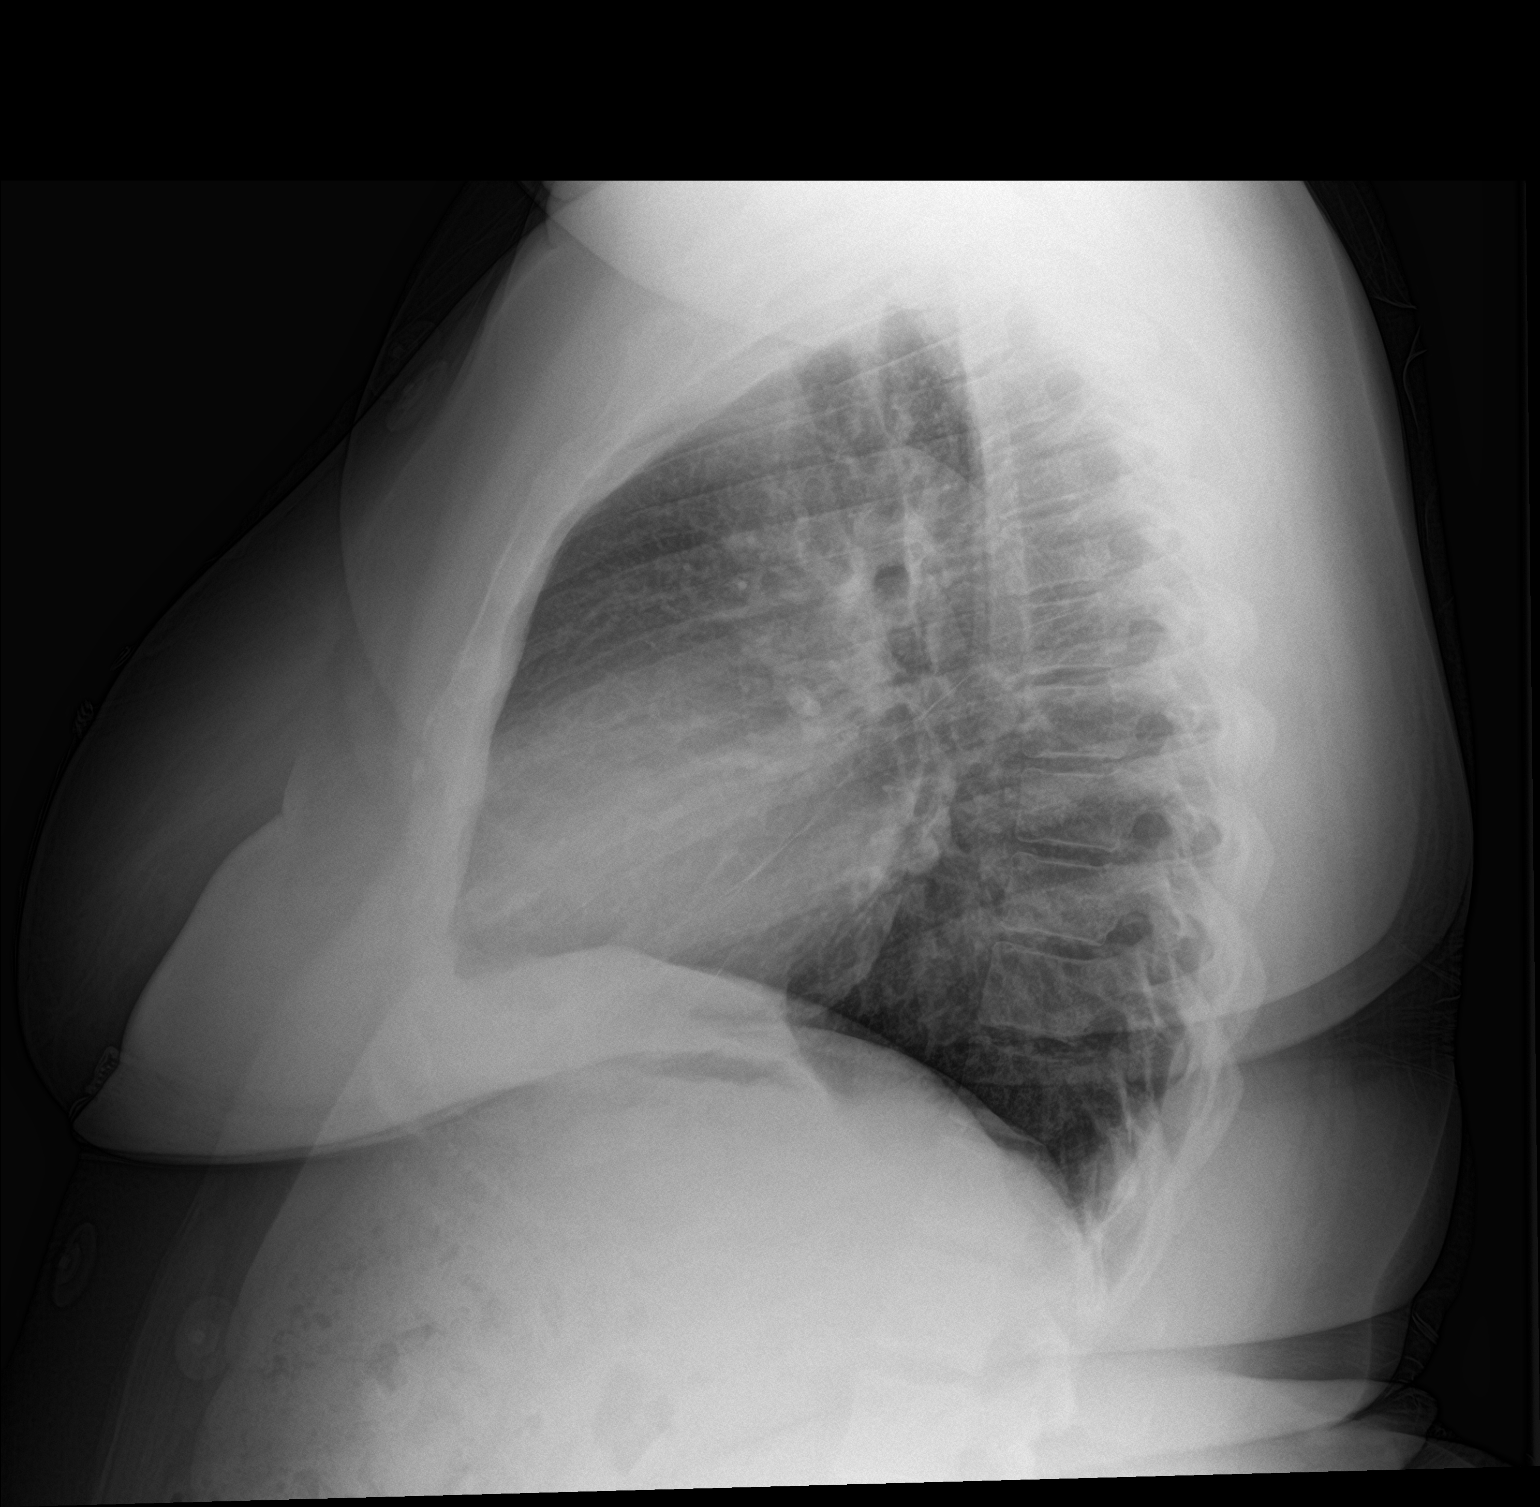

[2 of 2 positions shown; findings below may reference images not displayed]

FINDINGS: The cardiac silhouette is upper limits of normal in size. No
airspace consolidation, edema, pleural effusion, pneumothorax is
identified. No acute osseous abnormality is seen.
IMPRESSION: No active cardiopulmonary disease.

## 2019-09-13 ENCOUNTER — Other Ambulatory Visit: Payer: Self-pay | Admitting: Family Medicine

## 2019-09-13 ENCOUNTER — Telehealth: Payer: Self-pay | Admitting: Family Medicine

## 2019-09-13 NOTE — Telephone Encounter (Signed)
Cyclobenzaprine/Flexeril is not on patient's medication list.  Please contact patient to confirm that this is the medicine she is requesting and if so, please find out the reason that she needs the medication

## 2019-09-13 NOTE — Telephone Encounter (Signed)
Patient called in and requested for listed medication to be refilled. Please follow up at your earliest convenience.  cyclobenzaprine (FLEXERIL) tablet 10 mg  [[id number]] [facility]

## 2019-09-13 NOTE — Progress Notes (Unsigned)
Patient ID: Laurie Reed, female   DOB: 1985-09-15, 34 y.o.   MRN: [id number]  Phone call received requesting refill of cyclobenzaprine.  This medication is not on her current medication list.  We will have CMA contact patient to confirm requested medication.

## 2019-09-13 NOTE — Telephone Encounter (Signed)
Continued

## 2019-09-18 ENCOUNTER — Other Ambulatory Visit (HOSPITAL_COMMUNITY)
Admission: RE | Admit: 2019-09-18 | Discharge: 2019-09-18 | Disposition: A | Discharge status: Home / Self Care | Payer: Medicaid Other | Source: Ambulatory Visit | Attending: Internal Medicine | Admitting: Internal Medicine

## 2019-09-18 DIAGNOSIS — Z20822 Contact with and (suspected) exposure to covid-19: Secondary | ICD-10-CM | POA: Insufficient documentation

## 2019-09-18 DIAGNOSIS — Z01812 Encounter for preprocedural laboratory examination: Secondary | ICD-10-CM | POA: Diagnosis not present

## 2019-09-18 LAB — SARS CORONAVIRUS 2 (TAT 6-24 HRS): SARS Coronavirus 2: NEGATIVE

## 2019-09-19 ENCOUNTER — Encounter: Payer: Self-pay | Admitting: *Deleted

## 2019-09-19 NOTE — Telephone Encounter (Signed)
LMOM and sent mychart message to patient

## 2019-09-20 ENCOUNTER — Other Ambulatory Visit: Payer: Self-pay

## 2019-09-20 ENCOUNTER — Ambulatory Visit (HOSPITAL_BASED_OUTPATIENT_CLINIC_OR_DEPARTMENT_OTHER): Payer: Medicaid Other | Attending: Physician Assistant | Admitting: Internal Medicine

## 2019-09-20 VITALS — Temp 97.6°F | Ht 62.0 in | Wt 278.0 lb

## 2019-09-20 DIAGNOSIS — G479 Sleep disorder, unspecified: Secondary | ICD-10-CM | POA: Insufficient documentation

## 2019-09-20 DIAGNOSIS — Z6841 Body Mass Index (BMI) 40.0 and over, adult: Secondary | ICD-10-CM

## 2019-09-30 NOTE — Procedures (Signed)
    Patient Name: Laurie Reed, Laurie Reed Date: 09/20/2019 Gender: Female D.O.B: 1986-04-22 Age (years): 33 Referring Provider: Anders Simmonds PA-C Height (inches): 62 Interpreting Physician: Jetty Duhamel MD, ABSM Weight (lbs): 279 RPSGT: Armen Pickup BMI: 51 MRN: [id number] Neck Size: 16.00  CLINICAL INFORMATION Sleep Study Type: NPSG Indication for sleep study: Obesity, Snoring Epworth Sleepiness Score: 16  SLEEP STUDY TECHNIQUE As per the AASM Manual for the Scoring of Sleep and Associated Events v2.3 (April 2016) with a hypopnea requiring 4% desaturations.  The channels recorded and monitored were frontal, central and occipital EEG, electrooculogram (EOG), submentalis EMG (chin), nasal and oral airflow, thoracic and abdominal wall motion, anterior tibialis EMG, snore microphone, electrocardiogram, and pulse oximetry.  MEDICATIONS Medications self-administered by patient taken the night of the study : none reported  SLEEP ARCHITECTURE The study was initiated at 10:08:13 PM and ended at 4:39:18 AM.  Sleep onset time was 6.0 minutes and the sleep efficiency was 94.4%%. The total sleep time was 369 minutes.  Stage REM latency was 70.5 minutes.  The patient spent 2.4%% of the night in stage N1 sleep, 63.0%% in stage N2 sleep, 11.4%% in stage N3 and 23.2% in REM.  Alpha intrusion was absent.  Supine sleep was 24.66%.  RESPIRATORY PARAMETERS The overall apnea/hypopnea index (AHI) was 0.5 per hour. There were 0 total apneas, including 0 obstructive, 0 central and 0 mixed apneas. There were 3 hypopneas and 2 RERAs.  The AHI during Stage REM sleep was 1.4 per hour.  AHI while supine was 2.0 per hour.  The mean oxygen saturation was 96.7%. The minimum SpO2 during sleep was 89.0%.  moderate snoring was noted during this study.  CARDIAC DATA The 2 lead EKG demonstrated sinus rhythm. The mean heart rate was 84.8 beats per minute. Other EKG findings include: None.  LEG  MOVEMENT DATA The total PLMS were 0 with a resulting PLMS index of 0.0. Associated arousal with leg movement index was 0.0 .  IMPRESSIONS - No significant obstructive sleep apnea occurred during this study (AHI = 0.5/h). - No significant central sleep apnea occurred during this study (CAI = 0.0/h). - The patient had minimal or no oxygen desaturation during the study (Min O2 = 89.0%) Mean sat 96.7%. - The patient snored with moderate snoring volume. - No cardiac abnormalities were noted during this study.  DIAGNOSIS - Primary snoring  RECOMMENDATIONS - Manage for symptoms and snoring based on clinical judgment. - Be careful with alcohol, sedatives and other CNS depressants that may worsen sleep apnea and disrupt normal sleep architecture. - Sleep hygiene should be reviewed to assess factors that may improve sleep quality. - Weight management and regular exercise should be initiated or continued if appropriate.  [Electronically signed] 09/30/2019 11:44 AM  Jetty Duhamel MD, ABSM Diplomate, American Board of Sleep Medicine   NPI: [id number]                         Jetty Duhamel Diplomate, American Board of Sleep Medicine  ELECTRONICALLY SIGNED ON:  09/30/2019, 11:35 AM [facility] PH: [phone]   FX: [phone] ACCREDITED BY THE AMERICAN ACADEMY OF SLEEP MEDICINE

## 2019-10-02 ENCOUNTER — Other Ambulatory Visit: Payer: Self-pay | Admitting: Family Medicine

## 2019-10-02 DIAGNOSIS — M62830 Muscle spasm of back: Secondary | ICD-10-CM

## 2019-10-02 MED ORDER — CYCLOBENZAPRINE HCL 5 MG PO TABS: ORAL_TABLET | ORAL | 3 refills | Status: DC

## 2019-10-02 NOTE — Progress Notes (Signed)
Patient ID: Laurie Reed, female   DOB: [birth year], 34 y.o.   MRN: [id number]   MyChart request received from the patient for prescription for Flexeril to take as needed for muscle spasms.  She reports that this has been a long-term medication.  New prescription will be sent to patient's pharmacy.

## 2019-10-05 ENCOUNTER — Ambulatory Visit: Payer: Medicaid Other | Admitting: Gastroenterology

## 2019-10-05 ENCOUNTER — Encounter: Payer: Self-pay | Admitting: Gastroenterology

## 2019-10-05 VITALS — BP 108/74 | HR 98 | Ht 62.0 in | Wt 278.8 lb

## 2019-10-05 DIAGNOSIS — A048 Other specified bacterial intestinal infections: Secondary | ICD-10-CM

## 2019-10-05 DIAGNOSIS — R1013 Epigastric pain: Secondary | ICD-10-CM | POA: Diagnosis not present

## 2019-10-05 NOTE — Progress Notes (Signed)
[facility] Consult Note:  History: Laurie Reed 10/05/2019  Referring provider: Cain Saupe, MD  Reason for consult/chief complaint: Abdominal Pain (past x 1 month, all over with cramping )   Subjective  HPI:  This is a pleasant 34 year old woman referred by primary care for abdominal pain. She is a poor historian with limited health literacy.  January primary care visit note was reviewed.  It seems to describe crampy abdominal pain with intermittent urgency for bowel movements, which had apparently been going on many years.  She also describes some more focal upper abdominal pain with symptoms of regurgitation and chest pain that were thought to be from GERD.  There had been an ankle injury requiring frequent NSAID use thought perhaps to have exacerbated the symptoms.  Labs were done, H. pylori IgG positive and patient was prescribed treatment that seems to have the least included clarithromycin, but probably in combination with amoxicillin according to the notes I can find.  [name] says the upper abdominal pain improved afterwards, she still has reflux symptoms.  She was also having what sounds like some dysphagia that since resolved.  Bentyl was prescribed for possibility of IBS, not clear if patient is taking it or whether it affected her symptoms. ROS:  Review of Systems  Constitutional: Negative for appetite change and unexpected weight change.  HENT: Negative for mouth sores and voice change.   Eyes: Negative for pain and redness.  Respiratory: Negative for cough and shortness of breath.   Cardiovascular: Negative for chest pain and palpitations.  Genitourinary: Negative for dysuria and hematuria.  Musculoskeletal: Negative for arthralgias and myalgias.  Skin: Negative for pallor and rash.  Neurological: Negative for weakness and headaches.  Hematological: Negative for adenopathy.     Past Medical History: Past Medical History:  Diagnosis  Date  . Acid reflux 2004  . Anemia   . Anxiety 2008  . Asthma 1993   no previous intubations or hospitalizations   . Chlamydia   . Depression 2008   no meds, currenly ok  . Environmental allergies   . GERD (gastroesophageal reflux disease)   . Migraines   . Miscarriage   . Ovarian cyst   . Pre-diabetes   . Ulcer of the stomach and intestine 2004  . Urinary tract infection      Past Surgical History: Past Surgical History:  Procedure Laterality Date  . COLONOSCOPY  2004   . IRRIGATION AND DEBRIDEMENT SEBACEOUS CYST N/A 06/01/2017   Procedure: EXCISION SEBACEOUS CYST ON SCALP;  Surgeon: Ancil Linsey, MD;  Location: [facility] ORS;  Service: General;  Laterality: N/A;  . MULTIPLE TOOTH EXTRACTIONS  2015   6 teeth removed   . TUBAL LIGATION N/A 04/04/2018   Procedure: POST PARTUM TUBAL LIGATION;  Surgeon: Levie Heritage, DO;  Location: [facility];  Service: Gynecology;  Laterality: N/A;  . WISDOM TOOTH EXTRACTION  2007     Family History: Family History  Problem Relation Age of Onset  . Asthma Mother   . Heart disease Mother   . Hypertension Mother   . Clotting disorder Mother   . Diabetes Mother   . Asthma Maternal Aunt   . Hypertension Maternal Aunt   . Diabetes Maternal Aunt   . Asthma Maternal Uncle   . Hypertension Maternal Uncle   . Diabetes Maternal Uncle   . Heart disease Maternal Grandmother   . Diabetes Maternal Grandmother   . Hypertension Maternal Grandmother   . Heart disease Maternal Grandfather   .  Diabetes Maternal Grandfather   . Hypertension Maternal Grandfather   . Hypertension Paternal Grandmother   . Diabetes Maternal Uncle   . Heart disease Maternal Uncle   . Diabetes Maternal Uncle   . Cancer Paternal Aunt   . Hypotension Neg Hx   . Anesthesia problems Neg Hx   . Malignant hyperthermia Neg Hx   . Pseudochol deficiency Neg Hx     Social History: Social History   Socioeconomic History  . Marital status: Divorced    Spouse  name: Not on file  . Number of children: 4  . Years of education: 31   . Highest education level: Not on file  Occupational History    Employer: [employer]  Tobacco Use  . Smoking status: Never Smoker  . Smokeless tobacco: Never Used  Substance and Sexual Activity  . Alcohol use: No    Alcohol/week: 0.0 standard drinks  . Drug use: No  . Sexual activity: Yes    Birth control/protection: None, Surgical  Other Topics Concern  . Not on file  Social History Narrative   Lives with 3 children.   Immediate family in [place].    Born in [place].   Raised in [place], [place].    Lives in a 2 story home.   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 Difficulty of Paying Living Expenses:   Food Insecurity:   . Worried About Programme researcher, broadcasting/film/video in the Last Year:   . Barista in the Last Year:   Transportation Needs:   . Freight forwarder (Medical):   Marland Kitchen Lack of Transportation (Non-Medical):   Physical Activity:   . Days of Exercise per Week:   . Minutes of Exercise per Session:   Stress:   . Feeling of Stress :   Social Connections:   . Frequency of Communication with Friends and Family:   . Frequency of Social Gatherings with Friends and Family:   . Attends Religious Services:   . Active Member of Clubs or Organizations:   . 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 Other Itching    "20 different types of trees"  . Latex Hives and Itching  . Tree Extract Itching  . Zofran Itching    Outpatient Meds: Current Outpatient Medications  Medication Sig Dispense Refill  . acetaminophen (TYLENOL) 500 MG tablet Take 500-1,000 mg by mouth every 6 (six) hours as needed for mild pain, moderate pain or headache (or migraines).     . cyclobenzaprine (FLEXERIL) 5  MG tablet 1 pill every 8 hours as needed for muscle spasm and up to 2 pills at bedtime 30 tablet 3  . dicyclomine (BENTYL) 10 MG capsule Take 1 capsule (10 mg total) by mouth 4 (four) times daily -  before meals and at bedtime. For stomach cramping 120 capsule 1  . diphenhydrAMINE (BENADRYL) 25 mg capsule Take 25-50 mg by mouth 2 (two) times daily as needed for allergies.    . ferrous sulfate 324 (65 Fe) MG TBEC 1 pill daily after meal to help with anemia 30 tablet 3  . fexofenadine (ALLEGRA) 180 MG tablet Take 180 mg by mouth daily as needed for allergies or rhinitis.     Marland Kitchen  fluticasone (FLONASE) 50 MCG/ACT nasal spray Place 1 spray into both nostrils daily.    Marland Kitchen ibuprofen (ADVIL) 600 MG tablet Take 1 tablet (600 mg total) by mouth every 6 (six) hours as needed. 30 tablet 0  . omeprazole (PRILOSEC) 40 MG capsule Take 1 capsule (40 mg total) by mouth 2 (two) times daily. To reduce stomach acid 60 capsule 3  . sucralfate (CARAFATE) 1 GM/10ML suspension Take 10 mLs (1 g total) by mouth 4 (four) times daily -  with meals and at bedtime. 420 mL 0  . SUMAtriptan (IMITREX) 50 MG tablet Take 1 tablet (50 mg total) by mouth once as needed (at the onset of a migraine- may repeat once in 2 hours if headache persists or recurs). 10 tablet 1  . topiramate (TOPAMAX) 50 MG tablet Take 1 tablet (50 mg total) by mouth 2 (two) times daily. 60 tablet 3  . escitalopram (LEXAPRO) 10 MG tablet Take 1 tablet (10 mg total) by mouth daily. (Patient not taking: Reported on 10/05/2019) 30 tablet 1  . HYDROcodone-acetaminophen (NORCO) 5-325 MG tablet Take 1 tablet by mouth every 6 (six) hours as needed for moderate pain. (Patient not taking: Reported on 10/05/2019) 8 tablet 0   No current facility-administered medications for this visit.      ___________________________________________________________________ Objective   Exam:  BP 108/74   Pulse 98   Ht 5\' 2"  (1.575 m)   Wt 278 lb 12.8 oz (126.5 kg)   LMP 09/19/2019  (Approximate)   SpO2 99%   BMI 50.99 kg/m    General: Pleasant, well-appearing.  Able to get on exam table without difficulty.  Eyes: sclera anicteric, no redness  ENT: oral mucosa moist without lesions, no cervical or supraclavicular lymphadenopathy  CV: RRR without murmur, S1/S2, no JVD, no peripheral edema  Resp: clear to auscultation bilaterally, normal RR and effort noted  GI: soft, morbidly obese, limiting exam, mild epigastric tenderness, with active bowel sounds.  Cannot assess hepatosplenomegaly or mass due to body habitus.  Skin; warm and dry, no rash or jaundice noted  Neuro: awake, alert and oriented x 3. Normal gross motor function and fluent speech  Labs:  CBC Latest Ref Rng & Units 06/13/2019 11/10/2018 11/05/2018  WBC 3.4 - 10.8 x10E3/uL 8.7 9.6 8.2  Hemoglobin 11.1 - 15.9 g/dL 11.6 10.9(L) 11.0(L)  Hematocrit 34.0 - 46.6 % 36.3 36.5 36.2  Platelets 150 - 450 x10E3/uL 354 352 340   nml MCV/RDW  CMP Latest Ref Rng & Units 06/13/2019 11/10/2018 11/05/2018  Glucose 65 - 99 mg/dL 73 96 98  BUN 6 - 20 mg/dL 10 8 9   Creatinine 0.57 - 1.00 mg/dL 0.91 0.82 0.88  Sodium 134 - 144 mmol/L 137 137 137  Potassium 3.5 - 5.2 mmol/L 4.4 3.7 3.7  Chloride 96 - 106 mmol/L 106 105 105  CO2 20 - 29 mmol/L 22 24 25   Calcium 8.7 - 10.2 mg/dL 9.0 8.8(L) 8.8(L)  Total Protein 6.0 - 8.5 g/dL 6.9 - -  Total Bilirubin 0.0 - 1.2 mg/dL <0.2 - -  Alkaline Phos 39 - 117 IU/L 80 - -  AST 0 - 40 IU/L 14 - -  ALT 0 - 32 IU/L 11 - -   Pos H pylori IgG Jan 2021 - therapy ? clarithro and amox?  Radiologic Studies:  None for review  Assessment: Encounter Diagnoses  Name Primary?  . Abdominal pain, epigastric Yes  . H. pylori infection    Probably GERD is well based on  her description.  Persistent symptoms despite acid suppression, most notably due to morbid obesity.  I found it difficult to get a clear and consistent history, but it sounds like treatment of the H. pylori significantly  improve many of her symptoms.  She still has some underlying IBS-like crampy pain and altered bowel habits.  We also talked about the possibility of antibiotic resistant H. pylori, and the need to confirm eradication.  Plan:  Urea breath test while off PPI 5 days. Upper endoscopy would be increased risk given the patient's BMI.  Can be pursued if there is still uncertainty about clearance of H. pylori and/or control of symptoms with medicines. I did my best to explain the nature of GERD the need for diet and lifestyle measures. Plan weight loss essential to control GERD symptoms and also decreased chance of multiple long-term health issues. Discontinue Carafate.  Thank you for the courtesy of this consult.  Please call me with any questions or concerns.  Charlie Pitter III  CC: Referring provider noted above

## 2019-10-05 NOTE — Patient Instructions (Addendum)
If you are age 33 or older, your body mass index should be between 23-30. Your Body mass index is 50.99 kg/m. If this is out of the aforementioned range listed, please consider follow up with your Primary Care Provider.  If you are age 39 or younger, your body mass index should be between 19-25. Your Body mass index is 50.99 kg/m. If this is out of the aformentioned range listed, please consider follow up with your Primary Care Provider.   Please stop the Carafate       Please follow the instructions below to prepare for your test: H.pylori breath test : [facility]   [facility] [address]  [facility]  [phone]  [facility] [address]  [phone]      One day prior to your test, avoid high gas foods, high fiber goods, fruits and fruit juices, vegetables, beans, cereals, fiber supplements, onion and bell peppers. You may eat hamburger, chicken, beef, tuna fish and eggs.  (2 weeks prior to your test), stop any Pepto-Bismol and/or proton pump inhibitors (Prevacid, Zegerid, Nexium, Prilosec, Protonix, Aciphex, Dexilant).  You may use over the counter strength Axid, Pepcid, Tagamet and Zantac 2 weeks prior to your tests. However, you should STOP these 24 hours before the test. You may use Tums and Maalox.      It was a pleasure to see you today!  Dr. Myrtie Neither

## 2019-10-14 ENCOUNTER — Other Ambulatory Visit: Payer: Self-pay

## 2019-10-14 ENCOUNTER — Ambulatory Visit (HOSPITAL_COMMUNITY)
Admission: EM | Admit: 2019-10-14 | Discharge: 2019-10-14 | Disposition: A | Discharge status: Home / Self Care | Payer: Medicaid Other | Attending: Family Medicine | Admitting: Family Medicine

## 2019-10-14 ENCOUNTER — Encounter (HOSPITAL_COMMUNITY): Payer: Self-pay

## 2019-10-14 DIAGNOSIS — R059 Cough, unspecified: Secondary | ICD-10-CM

## 2019-10-14 DIAGNOSIS — J4541 Moderate persistent asthma with (acute) exacerbation: Secondary | ICD-10-CM | POA: Diagnosis not present

## 2019-10-14 DIAGNOSIS — R05 Cough: Secondary | ICD-10-CM

## 2019-10-14 MED ORDER — DEXAMETHASONE SODIUM PHOSPHATE 10 MG/ML IJ SOLN
INTRAMUSCULAR | Status: AC
  Filled 2019-10-14: qty 1

## 2019-10-14 MED ORDER — AEROCHAMBER PLUS FLO-VU MISC
1.0000 | Freq: Once | Status: AC
  Administered 2019-10-14: 1

## 2019-10-14 MED ORDER — ALBUTEROL SULFATE HFA 108 (90 BASE) MCG/ACT IN AERS
2.0000 | INHALATION_SPRAY | RESPIRATORY_TRACT | Status: DC
  Administered 2019-10-14: 2 via RESPIRATORY_TRACT

## 2019-10-14 MED ORDER — AEROCHAMBER PLUS FLO-VU LARGE MISC
Status: AC
  Filled 2019-10-14: qty 1

## 2019-10-14 MED ORDER — ALBUTEROL SULFATE HFA 108 (90 BASE) MCG/ACT IN AERS
INHALATION_SPRAY | RESPIRATORY_TRACT | Status: AC
  Filled 2019-10-14: qty 6.7

## 2019-10-14 MED ORDER — PREDNISONE 10 MG (21) PO TBPK: ORAL_TABLET | Freq: Every day | ORAL | 0 refills | Status: AC

## 2019-10-14 MED ORDER — DEXAMETHASONE SODIUM PHOSPHATE 10 MG/ML IJ SOLN
10.0000 mg | Freq: Once | INTRAMUSCULAR | Status: AC
  Administered 2019-10-14: 10 mg via INTRAMUSCULAR

## 2019-10-14 MED ORDER — ALBUTEROL SULFATE (2.5 MG/3ML) 0.083% IN NEBU: 2.5000 mg | INHALATION_SOLUTION | Freq: Four times a day (QID) | RESPIRATORY_TRACT | 12 refills | Status: DC | PRN

## 2019-10-14 MED ORDER — MONTELUKAST SODIUM 10 MG PO TABS: 10.0000 mg | ORAL_TABLET | Freq: Every day | ORAL | 1 refills | Status: DC

## 2019-10-14 MED ORDER — HYDROCODONE-HOMATROPINE 5-1.5 MG/5ML PO SYRP: 5.0000 mL | ORAL_SOLUTION | Freq: Four times a day (QID) | ORAL | 0 refills | Status: DC | PRN

## 2019-10-14 NOTE — ED Triage Notes (Signed)
Pt presents with non productive cough and loss of voice for over a week.

## 2019-10-14 NOTE — ED Triage Notes (Signed)
Pt had covid testing on 05/18 that was negative

## 2019-10-14 NOTE — Discharge Instructions (Signed)
I have sent in albuterol solution, Hycodan cough syrup, and a steroid taper for you to start tomorrow  You have received a steroid injection in the office as well  I have sent in singulair for you to take daily as well  Follow up to the ER for trouble swallowing, trouble breathing, other concerning symptoms

## 2019-10-17 NOTE — ED Provider Notes (Signed)
[facility]   [id number] 10/14/19 Arrival Time: 1551   CC: COVID symptoms  SUBJECTIVE: History from: patient.  Laurie Reed is a 34 y.o. female who presents with abrupt onset of nasal congestion, PND, SOB, wheezing, and persistent dry cough for 8-9 days. Denies sick exposure to COVID, flu or strep. Reports negative Covid test on 10/02/19. Reports asthma history. Denies recent travel. Has tried OTC cough and cold with no relief. There are no aggravating symptoms. Denies previous symptoms in the past. Denies fever, chills, fatigue, sinus pain, rhinorrhea, sore throat, chest pain, nausea, changes in bowel or bladder habits.    ROS: As per HPI.  All other pertinent ROS negative.     Past Medical History:  Diagnosis Date  . Acid reflux 2004  . Anemia   . Anxiety 2008  . Asthma 1993   no previous intubations or hospitalizations   . Chlamydia   . Depression 2008   no meds, currenly ok  . Environmental allergies   . GERD (gastroesophageal reflux disease)   . Migraines   . Miscarriage   . Ovarian cyst   . Pre-diabetes   . Ulcer of the stomach and intestine 2004  . Urinary tract infection    Past Surgical History:  Procedure Laterality Date  . COLONOSCOPY  2004   . IRRIGATION AND DEBRIDEMENT SEBACEOUS CYST N/A 06/01/2017   Procedure: EXCISION SEBACEOUS CYST ON SCALP;  Surgeon: Ancil Linsey, MD;  Location: [facility];  Service: General;  Laterality: N/A;  . MULTIPLE TOOTH EXTRACTIONS  2015   6 teeth removed   . TUBAL LIGATION N/A 04/04/2018   Procedure: POST PARTUM TUBAL LIGATION;  Surgeon: Levie Heritage, DO;  Location: [facility];  Service: Gynecology;  Laterality: N/A;  . WISDOM TOOTH EXTRACTION  2007   Allergies  Allergen Reactions  . Other Itching    "20 different types of trees"  . Latex Hives and Itching  . Tree Extract Itching  . Zofran Itching   No current facility-administered medications on file prior to encounter.   Current Outpatient  Medications on File Prior to Encounter  Medication Sig Dispense Refill  . acetaminophen (TYLENOL) 500 MG tablet Take 500-1,000 mg by mouth every 6 (six) hours as needed for mild pain, moderate pain or headache (or migraines).     . cyclobenzaprine (FLEXERIL) 5 MG tablet 1 pill every 8 hours as needed for muscle spasm and up to 2 pills at bedtime 30 tablet 3  . dicyclomine (BENTYL) 10 MG capsule Take 1 capsule (10 mg total) by mouth 4 (four) times daily -  before meals and at bedtime. For stomach cramping 120 capsule 1  . diphenhydrAMINE (BENADRYL) 25 mg capsule Take 25-50 mg by mouth 2 (two) times daily as needed for allergies.    Marland Kitchen escitalopram (LEXAPRO) 10 MG tablet Take 1 tablet (10 mg total) by mouth daily. (Patient not taking: Reported on 10/05/2019) 30 tablet 1  . ferrous sulfate 324 (65 Fe) MG TBEC 1 pill daily after meal to help with anemia 30 tablet 3  . fexofenadine (ALLEGRA) 180 MG tablet Take 180 mg by mouth daily as needed for allergies or rhinitis.     . fluticasone (FLONASE) 50 MCG/ACT nasal spray Place 1 spray into both nostrils daily.    Marland Kitchen HYDROcodone-acetaminophen (NORCO) 5-325 MG tablet Take 1 tablet by mouth every 6 (six) hours as needed for moderate pain. (Patient not taking: Reported on 10/05/2019) 8 tablet 0  . ibuprofen (ADVIL) 600 MG  tablet Take 1 tablet (600 mg total) by mouth every 6 (six) hours as needed. 30 tablet 0  . omeprazole (PRILOSEC) 40 MG capsule Take 1 capsule (40 mg total) by mouth 2 (two) times daily. To reduce stomach acid 60 capsule 3  . sucralfate (CARAFATE) 1 GM/10ML suspension Take 10 mLs (1 g total) by mouth 4 (four) times daily -  with meals and at bedtime. 420 mL 0  . SUMAtriptan (IMITREX) 50 MG tablet Take 1 tablet (50 mg total) by mouth once as needed (at the onset of a migraine- may repeat once in 2 hours if headache persists or recurs). 10 tablet 1  . topiramate (TOPAMAX) 50 MG tablet Take 1 tablet (50 mg total) by mouth 2 (two) times daily. 60 tablet  3  . [DISCONTINUED] cetirizine (ZYRTEC) 10 MG tablet Take 1 tablet (10 mg total) by mouth daily. (Patient not taking: Reported on 09/11/2018) 30 tablet 0   Social History   Socioeconomic History  . Marital status: Divorced    Spouse name: Not on file  . Number of children: 4  . Years of education: 73   . Highest education level: Not on file  Occupational History    Employer: [employer]  Tobacco Use  . Smoking status: Never Smoker  . Smokeless tobacco: Never Used  Substance and Sexual Activity  . Alcohol use: No    Alcohol/week: 0.0 standard drinks  . Drug use: No  . Sexual activity: Yes    Birth control/protection: None, Surgical  Other Topics Concern  . Not on file  Social History Narrative   Lives with 3 children.   Immediate family in [place].    Born in [place].   Raised in [place], [place].    Lives in a 2 story home.   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 Difficulty of Paying Living Expenses:   Food Insecurity:   . Worried About Programme researcher, broadcasting/film/video in the Last Year:   . Barista in the Last Year:   Transportation Needs:   . Freight forwarder (Medical):   Marland Kitchen Lack of Transportation (Non-Medical):   Physical Activity:   . Days of Exercise per Week:   . Minutes of Exercise per Session:   Stress:   . Feeling of Stress :   Social Connections:   . Frequency of Communication with Friends and Family:   . Frequency of Social Gatherings with Friends and Family:   . Attends Religious Services:   . Active Member of Clubs or Organizations:   . Attends Banker Meetings:   Marland Kitchen Marital Status:   Intimate Partner Violence:   . Fear of Current or Ex-Partner:   . Emotionally Abused:   Marland Kitchen Physically Abused:   . Sexually Abused:    Family History  Problem Relation Age of Onset  . Asthma Mother   . Heart disease Mother   . Hypertension Mother   . Clotting  disorder Mother   . Diabetes Mother   . Asthma Maternal Aunt   . Hypertension Maternal Aunt   . Diabetes Maternal Aunt   . Asthma Maternal Uncle   . Hypertension Maternal Uncle   . Diabetes Maternal Uncle   . Heart disease Maternal Grandmother   . Diabetes Maternal Grandmother   . Hypertension Maternal Grandmother   . Heart disease Maternal Grandfather   .  Diabetes Maternal Grandfather   . Hypertension Maternal Grandfather   . Hypertension Paternal Grandmother   . Diabetes Maternal Uncle   . Heart disease Maternal Uncle   . Diabetes Maternal Uncle   . Cancer Paternal Aunt   . Hypotension Neg Hx   . Anesthesia problems Neg Hx   . Malignant hyperthermia Neg Hx   . Pseudochol deficiency Neg Hx     OBJECTIVE:  Vitals:   10/14/19 1643  BP: 121/86  Pulse: 98  Resp: 17  Temp: 98 F (36.7 C)  TempSrc: Oral  SpO2: 99%     General appearance: alert; appears fatigued, but nontoxic; speaking in full sentences and tolerating own secretions HEENT: NCAT; Ears: EACs clear, TMs pearly gray; Eyes: PERRL.  EOM grossly intact. Sinuses: nontender; Nose: nares patent without rhinorrhea, Throat: oropharynx clear, tonsils non erythematous or enlarged, uvula midline  Neck: supple without LAD Lungs: unlabored respirations, symmetrical air entry; cough: moderate; no respiratory distress; diffuse wheezing noted throughout bilateral lung fields Heart: regular rate and rhythm. Radial pulses 2+ symmetrical bilaterally Skin: warm and dry Psychological: alert and cooperative; normal mood and affect  LABS:  No results found for this or any previous visit (from the past 24 hour(s)).   ASSESSMENT & PLAN:  1. Cough   2. Moderate persistent asthma with exacerbation     Meds ordered this encounter  Medications  . dexamethasone (DECADRON) injection 10 mg  . DISCONTD: albuterol (VENTOLIN HFA) 108 (90 Base) MCG/ACT inhaler 2 puff  . aerochamber plus with mask device 1 each  . albuterol (PROVENTIL)  (2.5 MG/3ML) 0.083% nebulizer solution    Sig: Take 3 mLs (2.5 mg total) by nebulization every 6 (six) hours as needed for wheezing or shortness of breath.    Dispense:  75 mL    Refill:  12    Order Specific Question:   Supervising Provider    Answer:   Chase Picket [id number]  . predniSONE (STERAPRED UNI-PAK 21 TAB) 10 MG (21) TBPK tablet    Sig: Take by mouth daily for 6 days. Take 6 tablets on day 1, 5 tablets on day 2, 4 tablets on day 3, 3 tablets on day 4, 2 tablets on day 5, 1 tablet on day 6    Dispense:  21 tablet    Refill:  0    Order Specific Question:   Supervising Provider    Answer:   Chase Picket [id number]  . HYDROcodone-homatropine (HYCODAN) 5-1.5 MG/5ML syrup    Sig: Take 5 mLs by mouth every 6 (six) hours as needed for cough.    Dispense:  120 mL    Refill:  0    Order Specific Question:   Supervising Provider    Answer:   Chase Picket [id number]  . montelukast (SINGULAIR) 10 MG tablet    Sig: Take 1 tablet (10 mg total) by mouth at bedtime.    Dispense:  30 tablet    Refill:  1    Order Specific Question:   Supervising Provider    Answer:   Chase Picket [[id number]]    Cough Moderate Persistent Asthma with exacerbation Decadron 10mg  IM given in office today Albuterol inhaler and spacer given in office today Prescribed albuterol nebulizer solution Prescribed montelukast Prescribed prednisone taper Prescribed Hycodan cough syrup Get plenty of rest and push fluids Use medications daily for symptom relief Use OTC medications like ibuprofen or tylenol as needed fever or pain  Call or go to the ED  if you have any new or worsening symptoms such as fever, worsening cough, shortness of breath, chest tightness, chest pain, turning blue, changes in mental status.   Reviewed expectations re: course of current medical issues. Questions answered. Outlined signs and symptoms indicating need for more acute intervention. Patient verbalized understanding.  After Visit Summary given.         Moshe Cipro, NP 10/17/19 1348

## 2019-10-18 ENCOUNTER — Telehealth: Payer: Self-pay

## 2019-10-18 NOTE — Telephone Encounter (Signed)
Left message as a reminder to complete test.

## 2019-10-18 NOTE — Telephone Encounter (Signed)
-----   Message from Charlie Pitter III, MD sent at 10/16/2019  1:37 PM EDT ----- Please give patient kind phone reminder to get UBT done.  - HD

## 2019-10-25 ENCOUNTER — Emergency Department (HOSPITAL_COMMUNITY): Payer: Medicaid Other

## 2019-10-25 ENCOUNTER — Encounter (HOSPITAL_COMMUNITY): Payer: Self-pay | Admitting: Emergency Medicine

## 2019-10-25 ENCOUNTER — Other Ambulatory Visit: Payer: Self-pay

## 2019-10-25 ENCOUNTER — Emergency Department (HOSPITAL_COMMUNITY)
Admission: EM | Admit: 2019-10-25 | Discharge: 2019-10-25 | Disposition: A | Discharge status: Home / Self Care | Payer: Medicaid Other | Attending: Emergency Medicine | Admitting: Emergency Medicine

## 2019-10-25 DIAGNOSIS — J45909 Unspecified asthma, uncomplicated: Secondary | ICD-10-CM | POA: Diagnosis not present

## 2019-10-25 DIAGNOSIS — Z91040 Latex allergy status: Secondary | ICD-10-CM | POA: Insufficient documentation

## 2019-10-25 DIAGNOSIS — Z79899 Other long term (current) drug therapy: Secondary | ICD-10-CM | POA: Insufficient documentation

## 2019-10-25 DIAGNOSIS — B9689 Other specified bacterial agents as the cause of diseases classified elsewhere: Secondary | ICD-10-CM

## 2019-10-25 DIAGNOSIS — J069 Acute upper respiratory infection, unspecified: Secondary | ICD-10-CM

## 2019-10-25 DIAGNOSIS — J019 Acute sinusitis, unspecified: Secondary | ICD-10-CM | POA: Insufficient documentation

## 2019-10-25 DIAGNOSIS — Z20822 Contact with and (suspected) exposure to covid-19: Secondary | ICD-10-CM | POA: Diagnosis not present

## 2019-10-25 DIAGNOSIS — R0789 Other chest pain: Secondary | ICD-10-CM | POA: Diagnosis present

## 2019-10-25 LAB — CBC
HCT: 35.2 % — ABNORMAL LOW (ref 36.0–46.0)
Hemoglobin: 10.9 g/dL — ABNORMAL LOW (ref 12.0–15.0)
MCH: 25.5 pg — ABNORMAL LOW (ref 26.0–34.0)
MCHC: 31 g/dL (ref 30.0–36.0)
MCV: 82.4 fL (ref 80.0–100.0)
Platelets: 358 10*3/uL (ref 150–400)
RBC: 4.27 MIL/uL (ref 3.87–5.11)
RDW: 14.9 % (ref 11.5–15.5)
WBC: 12.5 10*3/uL — ABNORMAL HIGH (ref 4.0–10.5)
nRBC: 0 % (ref 0.0–0.2)

## 2019-10-25 LAB — BASIC METABOLIC PANEL
Anion gap: 11 (ref 5–15)
BUN: 13 mg/dL (ref 6–20)
CO2: 24 mmol/L (ref 22–32)
Calcium: 8.9 mg/dL (ref 8.9–10.3)
Chloride: 102 mmol/L (ref 98–111)
Creatinine, Ser: 0.8 mg/dL (ref 0.44–1.00)
GFR calc Af Amer: >60 mL/min (ref >60)
GFR calc non Af Amer: >60 mL/min (ref >60)
Glucose, Bld: 104 mg/dL — ABNORMAL HIGH (ref 70–99)
Potassium: 3.8 mmol/L (ref 3.5–5.1)
Sodium: 137 mmol/L (ref 135–145)

## 2019-10-25 LAB — TROPONIN I (HIGH SENSITIVITY)
Troponin I (High Sensitivity): <2 ng/L (ref <18)
Troponin I (High Sensitivity): <2 ng/L (ref <18)

## 2019-10-25 LAB — SARS CORONAVIRUS 2 BY RT PCR (HOSPITAL ORDER, PERFORMED IN ~~LOC~~ HOSPITAL LAB): SARS Coronavirus 2: NEGATIVE

## 2019-10-25 LAB — D-DIMER, QUANTITATIVE: D-Dimer, Quant: 0.6 ug/mL-FEU — ABNORMAL HIGH (ref 0.00–0.50)

## 2019-10-25 LAB — I-STAT BETA HCG BLOOD, ED (NOT ORDERABLE): I-stat hCG, quantitative: <5 m[IU]/mL (ref <5)

## 2019-10-25 MED ORDER — HYDROCOD POLST-CPM POLST ER 10-8 MG/5ML PO SUER
5.0000 mL | Freq: Once | ORAL | Status: AC
  Administered 2019-10-25: 5 mL via ORAL
  Filled 2019-10-25: qty 5

## 2019-10-25 MED ORDER — METOCLOPRAMIDE HCL 10 MG PO TABS
10.0000 mg | ORAL_TABLET | Freq: Once | ORAL | Status: AC
  Administered 2019-10-25: 10 mg via ORAL
  Filled 2019-10-25: qty 1

## 2019-10-25 MED ORDER — IPRATROPIUM-ALBUTEROL 0.5-2.5 (3) MG/3ML IN SOLN
3.0000 mL | Freq: Once | RESPIRATORY_TRACT | Status: AC
  Administered 2019-10-25: 3 mL via RESPIRATORY_TRACT
  Filled 2019-10-25: qty 3

## 2019-10-25 MED ORDER — PREDNISONE 10 MG (21) PO TBPK
ORAL_TABLET | Freq: Every day | ORAL | 0 refills | Status: DC
Start: 2019-10-25 — End: 2020-01-02

## 2019-10-25 MED ORDER — IOHEXOL 350 MG/ML SOLN
100.0000 mL | Freq: Once | INTRAVENOUS | Status: AC | PRN
  Administered 2019-10-25: 100 mL via INTRAVENOUS

## 2019-10-25 MED ORDER — PREDNISONE 20 MG PO TABS
60.0000 mg | ORAL_TABLET | Freq: Once | ORAL | Status: AC
  Administered 2019-10-25: 60 mg via ORAL
  Filled 2019-10-25: qty 3

## 2019-10-25 MED ORDER — SODIUM CHLORIDE 0.9% FLUSH
3.0000 mL | Freq: Once | INTRAVENOUS | Status: AC
  Administered 2019-10-25: 3 mL via INTRAVENOUS

## 2019-10-25 MED ORDER — AMOXICILLIN-POT CLAVULANATE 875-125 MG PO TABS: 1.0000 | ORAL_TABLET | Freq: Two times a day (BID) | ORAL | 0 refills | Status: AC

## 2019-10-25 MED ORDER — SODIUM CHLORIDE (PF) 0.9 % IJ SOLN
INTRAMUSCULAR | Status: AC
  Filled 2019-10-25: qty 50

## 2019-10-25 NOTE — ED Notes (Signed)
Patient given cup of ice water for fluid challenge.  She is aware we need urine sample when she can go.

## 2019-10-25 NOTE — Discharge Instructions (Addendum)
Thank you for allowing me to care for you today in the Emergency Department.   Take prednisone as prescribed. Take Augmentin as prescribed.  Use your home nebulizer or albuterol inhaler every 4 hours to help with coughing, chest tightness, and shortness of breath.  Take 650 mg of Tylenol once every 6 hours to help with headaches.  Please follow closely with your primary care provider for recheck.  Return to the emergency department if you develop severe trouble breathing, if you pass out, if your fingers or lips turn blue, if you develop severe swelling in your legs, or other new, concerning symptoms.

## 2019-10-25 NOTE — ED Provider Notes (Signed)
[facility]-EMERGENCY DEPT Provider Note   CSN: [id number] Arrival date & time: 10/25/19  0053     History Chief Complaint  Patient presents with  . Chest Pain    Laurie Reed is a 34 y.o. female with a history of morbid obesity, GERD, and childhood asthma who presents the emergency department with a chief complaint of shortness of breath.  The patient reports that over a month ago she developed shortness of breath accompanied by a cough accompanied by headache, body aches, fever, chills, and sore throat.  She was seen at urgent care on 5/18.  She had a negative influenza, mononucleosis, and strep test.  She was treated with albuterol.  There was concern that she had a COVID-19 and was kept out of work for over a week.  She later reports that she lost her voice, but reports that this is since returned.  However, she reports that symptoms did not improve so she was seen at another urgent care on 5/30 for nasal congestion, PND, shortness of breath, wheezing, and persistent cough.  She was given a 6-day prednisone taper, albuterol nebulizer refills for home, albuterol treatment, and cough syrup with hydrocodone, and montelukast.  She reports the symptoms initially began to improve, but then acutely worsened.  States that she has now been so short of breath that she can barely walk from room to room.  She is also endorsing tightness in her chest that is worse with taking a deep breath accompanied by headaches, sinus pain and pressure, and intermittently feeling lightheaded.  She reports that she had one episode of vomiting yesterday and has been having occasional vomiting accompanied by nausea since onset of symptoms.    No constipation, diarrhea, abdominal pain, dysuria, hematuria, vaginal pain or discharge, rash, sore throat, otalgia, loss of sense of taste or smell..    She received both of her Materna COVID-19 vaccinations.  Second vaccination was in March 2021.  No  recent long travel.  She reports her mother has a history of a PE.  No known sick contacts.  She is a non-smoker.  She denies illicit or recreational substance use.  The history is provided by the patient. No language interpreter was used.       Past Medical History:  Diagnosis Date  . Acid reflux 2004  . Anemia   . Anxiety 2008  . Asthma 1993   no previous intubations or hospitalizations   . Chlamydia   . Depression 2008   no meds, currenly ok  . Environmental allergies   . GERD (gastroesophageal reflux disease)   . Migraines   . Miscarriage   . Ovarian cyst   . Pre-diabetes   . Ulcer of the stomach and intestine 2004  . Urinary tract infection     Patient Active Problem List   Diagnosis Date Noted  . Anemia in pregnancy 01/23/2018  . Morbid obesity with BMI of 45.0-49.9, adult (HCC) 10/19/2017  . Seasonal allergic rhinitis 11/01/2016  . Asthma 06/30/2015  . Vitamin D deficiency 01/22/2015  . Anxiety and depression 01/21/2015    Past Surgical History:  Procedure Laterality Date  . COLONOSCOPY  2004   . IRRIGATION AND DEBRIDEMENT SEBACEOUS CYST N/A 06/01/2017   Procedure: EXCISION SEBACEOUS CYST ON SCALP;  Surgeon: Ancil Linseyavis, Jason Evan, MD;  Location: [facility];  Service: General;  Laterality: N/A;  . MULTIPLE TOOTH EXTRACTIONS  2015   6 teeth removed   . TUBAL LIGATION N/A 04/04/2018   Procedure: POST  PARTUM TUBAL LIGATION;  Surgeon: Truett Mainland, DO;  Location: [place];  Service: Gynecology;  Laterality: N/A;  . WISDOM TOOTH EXTRACTION  2007     OB History    Gravida  7   Para  4   Term  4   Preterm  0   AB  3   Living  4     SAB  3   TAB  0   Ectopic  0   Multiple  0   Live Births  4           Family History  Problem Relation Age of Onset  . Asthma Mother   . Heart disease Mother   . Hypertension Mother   . Clotting disorder Mother   . Diabetes Mother   . Asthma Maternal Aunt   . Hypertension Maternal Aunt   .  Diabetes Maternal Aunt   . Asthma Maternal Uncle   . Hypertension Maternal Uncle   . Diabetes Maternal Uncle   . Heart disease Maternal Grandmother   . Diabetes Maternal Grandmother   . Hypertension Maternal Grandmother   . Heart disease Maternal Grandfather   . Diabetes Maternal Grandfather   . Hypertension Maternal Grandfather   . Hypertension Paternal Grandmother   . Diabetes Maternal Uncle   . Heart disease Maternal Uncle   . Diabetes Maternal Uncle   . Cancer Paternal Aunt   . Hypotension Neg Hx   . Anesthesia problems Neg Hx   . Malignant hyperthermia Neg Hx   . Pseudochol deficiency Neg Hx     Social History   Tobacco Use  . Smoking status: Never Smoker  . Smokeless tobacco: Never Used  Vaping Use  . Vaping Use: Never used  Substance Use Topics  . Alcohol use: No    Alcohol/week: 0.0 standard drinks  . Drug use: No    Home Medications Prior to Admission medications   Medication Sig Start Date End Date Taking? Authorizing Provider  acetaminophen (TYLENOL) 500 MG tablet Take 500-1,000 mg by mouth every 6 (six) hours as needed for mild pain, moderate pain or headache (or migraines).    Yes [provider]  albuterol (PROVENTIL) (2.5 MG/3ML) 0.083% nebulizer solution Take 3 mLs (2.5 mg total) by nebulization every 6 (six) hours as needed for wheezing or shortness of breath. 10/14/19  Yes Faustino Congress, NP  albuterol (VENTOLIN HFA) 108 (90 Base) MCG/ACT inhaler Inhale 1-2 puffs into the lungs every 6 (six) hours as needed for wheezing or shortness of breath.   Yes [provider]  diphenhydrAMINE (BENADRYL) 25 mg capsule Take 25-50 mg by mouth 2 (two) times daily as needed for allergies.   Yes [provider]  ferrous sulfate 324 (65 Fe) MG TBEC 1 pill daily after meal to help with anemia 11/22/18  Yes [name], [name], MD  fexofenadine (ALLEGRA) 180 MG tablet Take 180 mg by mouth daily as needed for allergies or rhinitis.    Yes [provider]  fluticasone (FLONASE) 50 MCG/ACT nasal spray Place 1 spray into both nostrils daily.   Yes [provider]  HYDROcodone-homatropine (HYCODAN) 5-1.5 MG/5ML syrup Take 5 mLs by mouth every 6 (six) hours as needed for cough. 10/14/19  Yes Faustino Congress, NP  ibuprofen (ADVIL) 600 MG tablet Take 1 tablet (600 mg total) by mouth every 6 (six) hours as needed. 04/27/19  Yes Upstill, Shari, PA-C  montelukast (SINGULAIR) 10 MG tablet Take 1 tablet (10 mg total) by mouth  at bedtime. 10/14/19  Yes Moshe Cipro, NP  omeprazole (PRILOSEC) 40 MG capsule Take 1 capsule (40 mg total) by mouth 2 (two) times daily. To reduce stomach acid 06/13/19  Yes [name], [name], MD  SUMAtriptan (IMITREX) 50 MG tablet Take 1 tablet (50 mg total) by mouth once as needed (at the onset of a migraine- may repeat once in 2 hours if headache persists or recurs). 11/22/18  Yes Hoy Register, MD  topiramate (TOPAMAX) 50 MG tablet Take 1 tablet (50 mg total) by mouth 2 (two) times daily. 11/22/18  Yes Hoy Register, MD  amoxicillin-clavulanate (AUGMENTIN) 875-125 MG tablet Take 1 tablet by mouth every 12 (twelve) hours for 10 days. 10/25/19 11/04/19  Yariana Hoaglund A, PA-C  escitalopram (LEXAPRO) 10 MG tablet Take 1 tablet (10 mg total) by mouth daily. Patient not taking: Reported on 10/05/2019 06/27/19   [name], Hewitt Shorts, MD  predniSONE (STERAPRED UNI-PAK 21 TAB) 10 MG (21) TBPK tablet Take by mouth daily. Take 6 tabs by mouth daily  for 2 days, then 5 tabs for 2 days, then 4 tabs for 2 days, then 3 tabs for 2 days, 2 tabs for 2 days, then 1 tab by mouth daily for 2 days 10/25/19   Zori Benbrook A, PA-C  cetirizine (ZYRTEC) 10 MG tablet Take 1 tablet (10 mg total) by mouth daily. Patient not taking: Reported on 09/11/2018 12/07/17 11/05/18  Judeth Horn, NP  dicyclomine (BENTYL) 10 MG capsule Take 1 capsule (10 mg total) by mouth 4 (four) times daily -  before meals and at bedtime. For stomach cramping Patient not  taking: Reported on 10/25/2019 06/13/19 10/25/19  [name], Hewitt Shorts, MD  sucralfate (CARAFATE) 1 GM/10ML suspension Take 10 mLs (1 g total) by mouth 4 (four) times daily -  with meals and at bedtime. Patient not taking: Reported on 10/25/2019 06/13/19 10/25/19  Cain Saupe, MD    Allergies    Latex, Other, Ondansetron, Tree extract, and Zofran  Review of Systems   Review of Systems  Constitutional: Negative for activity change, chills and fever.  HENT: Positive for sinus pressure. Negative for sinus pain and sore throat.   Respiratory: Positive for cough, chest tightness and shortness of breath. Negative for wheezing.   Cardiovascular: Positive for chest pain. Negative for palpitations and leg swelling.  Gastrointestinal: Positive for nausea and vomiting. Negative for abdominal pain, anal bleeding, blood in stool and diarrhea.  Genitourinary: Negative for dysuria, flank pain, frequency and urgency.  Musculoskeletal: Negative for back pain, myalgias, neck pain and neck stiffness.  Skin: Negative for rash.  Allergic/Immunologic: Negative for immunocompromised state.  Neurological: Positive for light-headedness and headaches. Negative for dizziness, seizures, syncope, weakness and numbness.  Psychiatric/Behavioral: Negative for confusion.    Physical Exam Updated Vital Signs BP (!) 103/56   Pulse 90   Temp 98.1 F (36.7 C) (Oral)   Resp 17   Ht  (1.575 m)   Wt 126.5 kg   SpO2 99%   BMI 50.99 kg/m   Physical Exam Vitals and nursing note reviewed.  Constitutional:      General: She is not in acute distress.    Appearance: She is obese. She is not ill-appearing, toxic-appearing or diaphoretic.  HENT:     Head: Normocephalic.     Comments: Tender to palpation over the bilateral frontal sinuses.  No maxillary sinus tenderness.    Right Ear: Tympanic membrane, ear canal and external ear normal.     Left Ear: Tympanic membrane, ear canal and  external ear normal.     Nose: Congestion  present.  Eyes:     Conjunctiva/sclera: Conjunctivae normal.  Neck:     Comments: No meningismus Cardiovascular:     Rate and Rhythm: Normal rate and regular rhythm.     Heart sounds: No murmur heard.  No friction rub. No gallop.   Pulmonary:     Effort: Pulmonary effort is normal. No respiratory distress.     Breath sounds: No stridor. No wheezing, rhonchi or rales.     Comments: She is mildly tachypneic and has conversational dyspnea very 3-4 words.  Lungs are clear to auscultation bilaterally, but diminished throughout.  No retractions, accessory muscle use, or nasal flaring. Abdominal:     General: There is no distension.     Palpations: Abdomen is soft. There is no mass.     Tenderness: There is no abdominal tenderness. There is no right CVA tenderness, left CVA tenderness, guarding or rebound.     Hernia: No hernia is present.     Comments: Abdomen soft, nondistended, nontender.  Musculoskeletal:     Cervical back: Neck supple.  Skin:    General: Skin is warm.     Findings: No rash.  Neurological:     Mental Status: She is alert.  Psychiatric:        Behavior: Behavior normal.    ED Results / Procedures / Treatments   Labs (all labs ordered are listed, but only abnormal results are displayed) Labs Reviewed  BASIC METABOLIC PANEL - Abnormal; Notable for the following components:      Result Value   Glucose, Bld 104 (*)    All other components within normal limits  CBC - Abnormal; Notable for the following components:   WBC 12.5 (*)    Hemoglobin 10.9 (*)    HCT 35.2 (*)    MCH 25.5 (*)    All other components within normal limits  D-DIMER, QUANTITATIVE (NOT AT [facility]) - Abnormal; Notable for the following components:   D-Dimer, Quant 0.60 (*)    All other components within normal limits  SARS CORONAVIRUS 2 BY RT PCR (HOSPITAL ORDER, PERFORMED IN [facility])  URINALYSIS, ROUTINE W REFLEX MICROSCOPIC  I-STAT BETA HCG BLOOD, ED ([facility], [facility], [facility] ONLY)  I-STAT  BETA HCG BLOOD, ED (NOT ORDERABLE)  TROPONIN I (HIGH SENSITIVITY)  TROPONIN I (HIGH SENSITIVITY)    EKG None  Radiology DG Chest 2 View  Result Date: 10/25/2019 CLINICAL DATA:  Chest pain EXAM: CHEST - 2 VIEW COMPARISON:  November 10, 2018 FINDINGS: The heart size and mediastinal contours are within normal limits. Both lungs are clear. The visualized skeletal structures are unremarkable. IMPRESSION: No active cardiopulmonary disease. Electronically Signed   By: Katherine Mantle M.D.   On: 10/25/2019 02:19   CT Angio Chest PE W and/or Wo Contrast  Result Date: 10/25/2019 CLINICAL DATA:  Shortness of breath mid chest pain for 1 month EXAM: CT ANGIOGRAPHY CHEST WITH CONTRAST TECHNIQUE: Multidetector CT imaging of the chest was performed using the standard protocol during bolus administration of intravenous contrast. Multiplanar CT image reconstructions and MIPs were obtained to evaluate the vascular anatomy. CONTRAST:  OMNIPAQUE IOHEXOL 350 MG/ML SOLN COMPARISON:  None. FINDINGS: Cardiovascular: Satisfactory opacification of the pulmonary arteries to the segmental level. No evidence of pulmonary embolism. Borderline heart size by CT, but normal appearing on the scout and prior chest x-ray. No pericardial effusion. Mediastinum/Nodes: No adenopathy or mass Lungs/Pleura: Mild dependent atelectasis. There is no  edema, consolidation, effusion, or pneumothorax. Upper Abdomen: Negative Musculoskeletal: Negative Review of the MIP images confirms the above findings. IMPRESSION: Negative chest CTA. Electronically Signed   By: Marnee Spring M.D.   On: 10/25/2019 07:24    Procedures Procedures (including critical care time)  Medications Ordered in ED Medications  sodium chloride flush (NS) 0.9 % injection 3 mL (3 mLs Intravenous Given 10/25/19 0441)  chlorpheniramine-HYDROcodone (TUSSIONEX) 10-8 MG/5ML suspension 5 mL (5 mLs Oral Given 10/25/19 0429)  ipratropium-albuterol (DUONEB) 0.5-2.5 (3) MG/3ML  nebulizer solution 3 mL (3 mLs Nebulization Given 10/25/19 0440)  predniSONE (DELTASONE) tablet 60 mg (60 mg Oral Given 10/25/19 0428)  metoCLOPramide (REGLAN) tablet 10 mg (10 mg Oral Given 10/25/19 0431)  iohexol (OMNIPAQUE) 350 MG/ML injection 100 mL (100 mLs Intravenous Contrast Given 10/25/19 0658)  sodium chloride (PF) 0.9 % injection (  Given by Other 10/25/19 7619)    ED Course  I have reviewed the triage vital signs and the nursing notes.  Pertinent labs & imaging results that were available during my care of the patient were reviewed by me and considered in my medical decision making (see chart for details).  Clinical Course as of Oct 24 744  Thu Oct 25, 2019  0705 Pleuritic CP after 1 month of viral like symptoms. Fu on CTA, ambulate with pulse ox.  DC with augmentin for sinusitis symptoms.    [CG]    Clinical Course User Index [CG] Jerrell Mylar   MDM Rules/Calculators/A&P                          34 year old female with a history of morbid obesity, GERD, and childhood asthma presenting with URI symptoms for more than a month with worsening shortness of breath, chest tightness, headache, and intermittent vomiting.  On exam, patient does have mild dyspnea and tachypnea.  Lungs are clear, but diminished throughout.  She was mildly tachycardic on arrival, but notably at bedside when she coughs her heart rate will increase to 120s on the monitor for at least 30 seconds to a minute.  She is not PERC negative at this time, but low risk so will order a D-dimer given that she is tachycardic, has a family history, and has been much more sedentary over the last month since her symptoms began.  Chest x-ray has been reviewed by me and is unremarkable.  She does have a mild leukocytosis that could be secondary to infection versus recent corticosteroid use.  She is anemic, but hemoglobin is stable from previous and she is having no bleeding symptoms.  Feels her troponin is  unremarkable.  Low suspicion for ACS, tension pneumothorax, aortic dissection.  Although patient has received Covid vaccination x2 will add on COVID-19 test.  D-dimer is elevated when adjusted for the patient's age.  We will add on CT PE study.  Patient care transferred to PA Gibbons at the end of my shift to follow-up on PE study and to ensure the patient is able to ambulate without hypoxia and tolerate p.o. fluids given that she had one episode of vomiting yesterday.  Given that she has bilateral frontal sinus tenderness with a mild leukocytosis will cover with Augmentin for acute bacterial sinusitis and a longer prednisone taper since patient initially reported some improvement after a 6-day taper almost 2 weeks ago.     Patient presentation, ED course, and plan of care discussed with review of all pertinent labs and imaging. Please see  his/her note for further details regarding further ED course and disposition.   Final Clinical Impression(s) / ED Diagnoses Final diagnoses:  Acute bacterial sinusitis  Upper respiratory tract infection, unspecified type    Rx / DC Orders ED Discharge Orders         Ordered    amoxicillin-clavulanate (AUGMENTIN) 875-125 MG tablet  Every 12 hours     Discontinue  Reprint     10/25/19 0735    predniSONE (STERAPRED UNI-PAK 21 TAB) 10 MG (21) TBPK tablet  Daily     Discontinue  Reprint     10/25/19 0736           Frederik Pear A, PA-C 10/25/19 0747    Ward, Layla Maw, DO 10/25/19 0856

## 2019-10-25 NOTE — ED Provider Notes (Signed)
  Physical Exam  BP (!) 103/56   Pulse 90   Temp 98.1 F (36.7 C) (Oral)   Resp 17   Ht 5\' 2"  (1.575 m)   Wt 126.5 kg   SpO2 99%   BMI 50.99 kg/m    ED Course/Procedures   Clinical Course as of Oct 24 837  Thu Oct 25, 2019  0705 Pleuritic CP after 1 month of viral like symptoms. Fu on CTA, ambulate with pulse ox.  DC with augmentin for sinusitis symptoms.    [CG]    Clinical Course User Index [CG] 0706    Procedures  MDM   [phone]: Patient handed off to me by previous ED PA at shift change.  See previous note for full details.  ER work-up personally reviewed and interpreted remarkable as above.  Mild leukocytosis.  Mild anemia.  Chest pain related work-up is normal, negative CT angio for PE.  Patient has ambulated here without hypoxia.  Tolerating p.o.  Appropriate for discharge.  Discussed discharge instructions with patient who is comfortable with the ER treatment and discharge plan.  Return precautions given.    2703, PA-C 10/25/19 0840    12/25/19, DO 10/25/19 1241

## 2019-10-25 NOTE — ED Notes (Signed)
Pulse ox while ambulating 100% °

## 2019-10-25 NOTE — ED Triage Notes (Signed)
Patient is complaining of a cough that has been going on for a month. Patient is also complaining of mid chest pain that has been going on for a month also.

## 2019-11-02 NOTE — Telephone Encounter (Signed)
Letter mailed to patients address on file and a copy of the lab order was enclosed.

## 2019-11-16 NOTE — Telephone Encounter (Signed)
No resulted lab and no communication from the patient

## 2019-12-07 ENCOUNTER — Other Ambulatory Visit: Payer: Self-pay | Admitting: Gastroenterology

## 2019-12-09 LAB — H. PYLORI BREATH TEST: H pylori Breath Test: NEGATIVE

## 2019-12-11 ENCOUNTER — Other Ambulatory Visit: Payer: Self-pay

## 2019-12-11 ENCOUNTER — Encounter (HOSPITAL_COMMUNITY): Payer: Self-pay | Admitting: Obstetrics and Gynecology

## 2019-12-11 ENCOUNTER — Ambulatory Visit (INDEPENDENT_AMBULATORY_CARE_PROVIDER_SITE_OTHER)
Admission: RE | Admit: 2019-12-11 | Discharge: 2019-12-11 | Disposition: A | Discharge status: Other Acute Inpatient Hospital | Payer: Medicaid Other | Source: Ambulatory Visit

## 2019-12-11 ENCOUNTER — Emergency Department (HOSPITAL_COMMUNITY)
Admission: EM | Admit: 2019-12-11 | Discharge: 2019-12-11 | Disposition: A | Discharge status: Home / Self Care | Payer: Medicaid Other | Attending: Emergency Medicine | Admitting: Emergency Medicine

## 2019-12-11 ENCOUNTER — Ambulatory Visit: Payer: Self-pay | Admitting: *Deleted

## 2019-12-11 ENCOUNTER — Emergency Department (HOSPITAL_COMMUNITY): Payer: Medicaid Other

## 2019-12-11 DIAGNOSIS — M545 Low back pain: Secondary | ICD-10-CM | POA: Insufficient documentation

## 2019-12-11 DIAGNOSIS — R0602 Shortness of breath: Secondary | ICD-10-CM | POA: Diagnosis not present

## 2019-12-11 DIAGNOSIS — Z79899 Other long term (current) drug therapy: Secondary | ICD-10-CM | POA: Diagnosis not present

## 2019-12-11 DIAGNOSIS — R079 Chest pain, unspecified: Secondary | ICD-10-CM

## 2019-12-11 DIAGNOSIS — G8929 Other chronic pain: Secondary | ICD-10-CM | POA: Insufficient documentation

## 2019-12-11 DIAGNOSIS — Z91040 Latex allergy status: Secondary | ICD-10-CM | POA: Diagnosis not present

## 2019-12-11 DIAGNOSIS — J45909 Unspecified asthma, uncomplicated: Secondary | ICD-10-CM | POA: Diagnosis not present

## 2019-12-11 LAB — URINALYSIS, ROUTINE W REFLEX MICROSCOPIC
Bacteria, UA: NONE SEEN
Bilirubin Urine: NEGATIVE
Glucose, UA: NEGATIVE mg/dL
Ketones, ur: NEGATIVE mg/dL
Leukocytes,Ua: NEGATIVE
Nitrite: NEGATIVE
Protein, ur: NEGATIVE mg/dL
Specific Gravity, Urine: 1.018 (ref 1.005–1.030)
pH: 6 (ref 5.0–8.0)

## 2019-12-11 LAB — CBC
HCT: 37.7 % (ref 36.0–46.0)
Hemoglobin: 11.3 g/dL — ABNORMAL LOW (ref 12.0–15.0)
MCH: 25.1 pg — ABNORMAL LOW (ref 26.0–34.0)
MCHC: 30 g/dL (ref 30.0–36.0)
MCV: 83.6 fL (ref 80.0–100.0)
Platelets: 344 10*3/uL (ref 150–400)
RBC: 4.51 MIL/uL (ref 3.87–5.11)
RDW: 14.4 % (ref 11.5–15.5)
WBC: 9 10*3/uL (ref 4.0–10.5)
nRBC: 0 % (ref 0.0–0.2)

## 2019-12-11 LAB — I-STAT BETA HCG BLOOD, ED (MC, WL, AP ONLY): I-stat hCG, quantitative: <5 m[IU]/mL (ref <5)

## 2019-12-11 LAB — COMPREHENSIVE METABOLIC PANEL
ALT: 14 U/L (ref 0–44)
AST: 14 U/L — ABNORMAL LOW (ref 15–41)
Albumin: 3.3 g/dL — ABNORMAL LOW (ref 3.5–5.0)
Alkaline Phosphatase: 60 U/L (ref 38–126)
Anion gap: 11 (ref 5–15)
BUN: 14 mg/dL (ref 6–20)
CO2: 26 mmol/L (ref 22–32)
Calcium: 9.3 mg/dL (ref 8.9–10.3)
Chloride: 104 mmol/L (ref 98–111)
Creatinine, Ser: 0.83 mg/dL (ref 0.44–1.00)
GFR calc Af Amer: >60 mL/min (ref >60)
GFR calc non Af Amer: >60 mL/min (ref >60)
Glucose, Bld: 95 mg/dL (ref 70–99)
Potassium: 4 mmol/L (ref 3.5–5.1)
Sodium: 141 mmol/L (ref 135–145)
Total Bilirubin: 0.1 mg/dL — ABNORMAL LOW (ref 0.3–1.2)
Total Protein: 7.2 g/dL (ref 6.5–8.1)

## 2019-12-11 LAB — LIPASE, BLOOD: Lipase: 23 U/L (ref 11–51)

## 2019-12-11 MED ORDER — SODIUM CHLORIDE 0.9% FLUSH: 3.0000 mL | Freq: Once | INTRAVENOUS | Status: DC

## 2019-12-11 MED ORDER — OXYCODONE-ACETAMINOPHEN 5-325 MG PO TABS
1.0000 | ORAL_TABLET | Freq: Once | ORAL | Status: AC
  Administered 2019-12-11: 1 via ORAL
  Filled 2019-12-11: qty 1

## 2019-12-11 MED ORDER — METHOCARBAMOL 500 MG PO TABS
500.0000 mg | ORAL_TABLET | Freq: Two times a day (BID) | ORAL | 0 refills | Status: DC
Start: 2019-12-11 — End: 2019-12-14

## 2019-12-11 MED ORDER — METHYLPREDNISOLONE 4 MG PO TBPK
ORAL_TABLET | ORAL | 0 refills | Status: DC
Start: 2019-12-11 — End: 2020-01-02

## 2019-12-11 MED ORDER — LIDOCAINE 5 % EX PTCH
1.0000 | MEDICATED_PATCH | CUTANEOUS | Status: DC
  Administered 2019-12-11: 1 via TRANSDERMAL
  Filled 2019-12-11: qty 1

## 2019-12-11 MED ORDER — CYCLOBENZAPRINE HCL 10 MG PO TABS
5.0000 mg | ORAL_TABLET | Freq: Once | ORAL | Status: AC
  Administered 2019-12-11: 5 mg via ORAL
  Filled 2019-12-11: qty 1

## 2019-12-11 NOTE — ED Notes (Signed)
ISTAT beta result negative (<5).

## 2019-12-11 NOTE — ED Provider Notes (Signed)
Virtual Visit via Video Note:  Laurie Reed  initiated request for Telemedicine visit with [facility] team. I connected with Laurie Reed  on 12/11/2019 at 8:36 AM  for a synchronized telemedicine visit using a video enabled HIPPA compliant telemedicine application. I verified that I am speaking with Laurie Reed  using two identifiers. Mickie Bail, NP  was physically located in a [facility] Urgent care site and [name] was located at a different location.   The limitations of evaluation and management by telemedicine as well as the availability of in-person appointments were discussed. Patient was informed that she  may incur a bill ( including co-pay) for this virtual visit encounter. [name]  expressed understanding and gave verbal consent to proceed with virtual visit.     History of Present Illness:Laurie Reed  is a 34 y.o. female presents for evaluation of back pain x 4 days.  Fatigue, nausea, vomiting, light-headeded x 3 days.  She also reports chest pain and shortness of breath.  She also reports her "skin itching."  She took Flexeril for her back pain.   Allergies  Allergen Reactions  . Latex Hives and Itching  . Other Itching    "20 different types of trees"  . Ondansetron Itching  . Tree Extract Itching  . Zofran Itching     Past Medical History:  Diagnosis Date  . Acid reflux 2004  . Anemia   . Anxiety 2008  . Asthma 1993   no previous intubations or hospitalizations   . Chlamydia   . Depression 2008   no meds, currenly ok  . Environmental allergies   . GERD (gastroesophageal reflux disease)   . Migraines   . Miscarriage   . Ovarian cyst   . Pre-diabetes   . Ulcer of the stomach and intestine 2004  . Urinary tract infection      Social History   Tobacco Use  . Smoking status: Never Smoker  . Smokeless tobacco: Never Used  Vaping Use  . Vaping Use: Never used  Substance Use Topics  . Alcohol use: No     Alcohol/week: 0.0 standard drinks  . Drug use: No   ROS: as stated in HPI.  All other systems reviewed and negative.        Observations/Objective: Physical Exam  VITALS: Patient denies fever. GENERAL: Alert, appears well and in no acute distress. HEENT: Atraumatic. Oral mucosa appears moist. NECK: Normal movements of the head and neck. CARDIOPULMONARY: No increased WOB. Speaking in clear sentences. I:E ratio WNL.  MS: Moves all visible extremities without noticeable abnormality. PSYCH: Pleasant and cooperative, well-groomed. Speech normal rate and rhythm. Affect is appropriate. Insight and judgement are appropriate. Attention is focused, linear, and appropriate.  NEURO: CN grossly intact. Oriented as arrived to appointment on time with no prompting. Moves both UE equally.  SKIN: No obvious lesions, wounds, erythema, or cyanosis noted on face or hands.   Assessment and Plan:    ICD-10-CM   1. Chest pain, unspecified type  R07.9   2. Shortness of breath  R06.02        Follow Up Instructions: Instructed patient to go to the emergency room for evaluation of her chest pain, shortness of breath, and other concerning symptoms. Patient agrees.    I discussed the assessment and treatment plan with the patient. The patient was provided an opportunity to ask questions and all were answered. The patient agreed with the plan and demonstrated an understanding of the  instructions.   The patient was advised to call back or seek an in-person evaluation if the symptoms worsen or if the condition fails to improve as anticipated.      Mickie Bail, NP  12/11/2019 8:36 AM         Mickie Bail, NP 12/11/19 [phone]

## 2019-12-11 NOTE — ED Triage Notes (Signed)
Patient reports to the ER for back pain and SOB. Patient reports her back has been hurting x4 days. Patient reports N/V. Patient reports weakness and lightheadedness

## 2019-12-11 NOTE — Telephone Encounter (Addendum)
Pt called in c/o mid to lower back pain with shooting pain into the thigh on the left side.  While scheduling her to be seen within 4 hours per the protocol she mentioned she was having sharp shooting pains in her back and that she can't hardly move.   She c/o pain in her chest when she is standing up and moving around.  I changed the deposition for her to go on to the ED for further evaluation.    She said she had someone who could take her when I asked and instructed her not to drive.  She did not tell me which ED she was going to even after I asked her twice but mentioned she would go.    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   No injury on the job that she is aware of.  She is taking Flexeril but has run out of it.   She has been taking it as needed since 2002 from back pain working at [employer] lifting cases of stuff.  She took a Tramadol but it made her vomit.    See triage notes.  I noticed while finishing up my charting that she did a video visit with [facility] earlier today.   She did not mention this to me at all.  Reason for Disposition . [1] SEVERE back pain (e.g., excruciating, unable to do any normal activities) AND [2] not improved 2 hours after pain medicine  Answer Assessment - Initial Assessment Questions 1. LOCATION: "Where does it hurt?"       I'm hurting in chest with walking and shortness of breath.  My mid to lower back hurts all the time.   I can't sleep.    I took a sleeping pill that made me vomit  And drowsy.     I'm light headed and weak now.   I'm trying to see my doctor.   I'm itching so bad.   My chest only hurts when I'm walking.   "I have all year round allergies"   (I can hear her sniffing and she sounds congested while talking with her).   She is supposed to take allergy shots but I'm just using OTC allergy meds. 2. RADIATION: "Does the pain go anywhere else?" (e.g., into neck, jaw, arms, back)     *No Answer* 3. ONSET: "When did  the chest pain begin?" (Minutes, hours or days)      *No Answer* 4. PATTERN "Does the pain come and go, or has it been constant since it started?"  "Does it get worse with exertion?"      *No Answer* 5. DURATION: "How long does it last" (e.g., seconds, minutes, hours)     *No Answer* 6. SEVERITY: "How bad is the pain?"  (e.g., Scale 1-10; mild, moderate, or severe)    - MILD (1-3): doesn't interfere with normal activities     - MODERATE (4-7): interferes with normal activities or awakens from sleep    - SEVERE (8-10): excruciating pain, unable to do any normal activities       *No Answer* 7. CARDIAC RISK FACTORS: "Do you have any history of heart problems or risk factors for heart disease?" (e.g., angina, prior heart attack; diabetes, high blood pressure, high cholesterol, smoker, or strong family history of heart disease)     *No Answer* 8. PULMONARY RISK FACTORS: "Do you have any history of lung disease?"  (e.g., blood clots in lung, asthma,  emphysema, birth control pills)     *No Answer* 9. CAUSE: "What do you think is causing the chest pain?"     *No Answer* 10. OTHER SYMPTOMS: "Do you have any other symptoms?" (e.g., dizziness, nausea, vomiting, sweating, fever, difficulty breathing, cough)       *No Answer* 11. PREGNANCY: "Is there any chance you are pregnant?" "When was your last menstrual period?"       *No Answer*  Answer Assessment - Initial Assessment Questions 1. ONSET: "When did the pain begin?"      I'm having mid to lower back pain real bad.  It started Wednesday.  It hurts all the time.   I can't sleep because of the back pain.   Denies injuries or falls.  I'm taking Flexeril.    I've been taking it so long it doesn't work much. I work in a group home and I have to lift pts and move them.   I'm on vacation.   I went back Sunday after vacation to work but I had to leave work because I got sweaty, shaky and vomited.   I only vomited once.   I think the Tramadol made me sick and  vomit.   No vomiting since. 2. LOCATION: "Where does it hurt?" (upper, mid or lower back)     Mid to lower back hurts.   I can\'t hardly move.   When I\'m walking around it hurts into my chest.   I\'m walking bent over. 3. SEVERITY: "How bad is the pain?"  (e.g., Scale 1-10; mild, moderate, or severe)   - MILD (1-3): doesn\'t interfere with normal activities    - MODERATE (4-7): interferes with normal activities or awakens from sleep    - SEVERE (8-10): excruciating pain, unable to do any normal activities      Severe  9 on scale.  4. PATTERN: "Is the pain constant?" (e.g., yes, no; constant, intermittent)      It hurts all the time but when I move around I hurt worse. 5. RADIATION: "Does the pain shoot into your legs or elsewhere?"     I had pain shooting down left side into my thigh. 6. CAUSE:  "What do you think is causing the back pain?"      I don\'t know.   "It just started" 7. 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 8. MEDICATIONS: "What have you taken so far for the pain?" (e.g., nothing, acetaminophen, NSAIDS)     Flexeril and Tramadol. 9. NEUROLOGIC SYMPTOMS: "Do you have any weakness, numbness, or problems with bowel/bladder control?"     20 02 I was working at my back was hurting from lifting cases.  I was started on the Flexeril for muscle spasms. 10. OTHER SYMPTOMS: "Do you have any other symptoms?" (e.g., fever, abdominal pain, burning with urination, blood in urine)       No   I'm having nausea. 11. PREGNANCY: "Is there any chance you are pregnant?" (e.g., yes, no; LMP)       No  Have my period last Tuesday.  Protocols used: BACK PAIN-A-AH, CHEST PAIN-A-AH

## 2019-12-11 NOTE — Discharge Instructions (Signed)
Go to the Emergency Department for evaluation of your chest pain, shortness of breath, and other symptoms.

## 2019-12-11 NOTE — ED Provider Notes (Signed)
[facility]-EMERGENCY DEPT Provider Note   CSN: [id number] Arrival date & time: 12/11/19  1021     History Chief Complaint  Patient presents with  . Back Pain  . Shortness of Breath    Laurie Reed is a 34 y.o. female.  HPI  34 year old female, with a PMH of acid reflux, asthma, chronic back pain, presents with multiple complaints.  Patient presents with acute on chronic back pain.  She states she has had back pain since 2012.  She states it worsened over the last week.  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  She the pain down the notes radiation of left hip and leg.  She denies any bowel or bladder incontinence, saddle anesthesias.  She denies any fevers, chills.  Patient also complaining of chest pain however when asked further about this she states "I said it was mild chest pain and I am here for my back pain."  She also states shortness of breath which she states is secondary to the pain from her back.  She states that the last time her back pain is this bad was approximately a year ago and she received no treatment that helped her.  She has never followed with a specialist in regards to her back pain.  She is also complaining of itching her back and abdomen.  She takes Benadryl for this which improves her symptoms however she only takes 25 of Benadryl because 50 of Benadryl makes her too sleepy.  She also states she has been nauseous for the last several days but has been p.o. tolerant to to go food which she ate in the lobby.  She denies any vomiting.     Past Medical History:  Diagnosis Date  . Acid reflux 2004  . Anemia   . Anxiety 2008  . Asthma 1993   no previous intubations or hospitalizations   . Chlamydia   . Depression 2008   no meds, currenly ok  . Environmental allergies   . GERD (gastroesophageal reflux disease)   . Migraines   . Miscarriage   . Ovarian cyst   . Pre-diabetes   . Ulcer of the stomach and intestine 2004   . Urinary tract infection     Patient Active Problem List   Diagnosis Date Noted  . Anemia in pregnancy 01/23/2018  . Morbid obesity with BMI of 45.0-49.9, adult (HCC) 10/19/2017  . Seasonal allergic rhinitis 11/01/2016  . Asthma 06/30/2015  . Vitamin D deficiency 01/22/2015  . Anxiety and depression 01/21/2015    Past Surgical History:  Procedure Laterality Date  . COLONOSCOPY  2004   . IRRIGATION AND DEBRIDEMENT SEBACEOUS CYST N/A 06/01/2017   Procedure: EXCISION SEBACEOUS CYST ON SCALP;  Surgeon: Ancil Linsey, MD;  Location: [facility] ORS;  Service: General;  Laterality: N/A;  . MULTIPLE TOOTH EXTRACTIONS  2015   6 teeth removed   . TUBAL LIGATION N/A 04/04/2018   Procedure: POST PARTUM TUBAL LIGATION;  Surgeon: Levie Heritage, DO;  Location: [facility];  Service: Gynecology;  Laterality: N/A;  . WISDOM TOOTH EXTRACTION  2007     OB History    Gravida  7   Para  4   Term  4   Preterm  0   AB  3   Living  4     SAB  3   TAB  0   Ectopic  0   Multiple  0   Live Births  4           Family History  Problem Relation Age of Onset  . Asthma Mother   . Heart disease Mother   . Hypertension Mother   . Clotting disorder Mother   . Diabetes Mother   . Asthma Maternal Aunt   . Hypertension Maternal Aunt   . Diabetes Maternal Aunt   . Asthma Maternal Uncle   . Hypertension Maternal Uncle   . Diabetes Maternal Uncle   . Heart disease Maternal Grandmother   . Diabetes Maternal Grandmother   . Hypertension Maternal Grandmother   . Heart disease Maternal Grandfather   . Diabetes Maternal Grandfather   . Hypertension Maternal Grandfather   . Hypertension Paternal Grandmother   . Diabetes Maternal Uncle   . Heart disease Maternal Uncle   . Diabetes Maternal Uncle   . Cancer Paternal Aunt   . Hypotension Neg Hx   . Anesthesia problems Neg Hx   . Malignant hyperthermia Neg Hx   . Pseudochol deficiency Neg Hx     Social History   Tobacco  Use  . Smoking status: Never Smoker  . Smokeless tobacco: Never Used  Vaping Use  . Vaping Use: Never used  Substance Use Topics  . Alcohol use: No    Alcohol/week: 0.0 standard drinks  . Drug use: No    Home Medications Prior to Admission medications   Medication Sig Start Date End Date Taking? Authorizing Provider  acetaminophen (TYLENOL) 500 MG tablet Take 500-1,000 mg by mouth every 6 (six) hours as needed for mild pain, moderate pain or headache (or migraines).     [provider]  albuterol (PROVENTIL) (2.5 MG/3ML) 0.083% nebulizer solution Take 3 mLs (2.5 mg total) by nebulization every 6 (six) hours as needed for wheezing or shortness of breath. 10/14/19   Moshe Cipro, NP  albuterol (VENTOLIN HFA) 108 (90 Base) MCG/ACT inhaler Inhale 1-2 puffs into the lungs every 6 (six) hours as needed for wheezing or shortness of breath.    [provider]  diphenhydrAMINE (BENADRYL) 25 mg capsule Take 25-50 mg by mouth 2 (two) times daily as needed for allergies.    [provider]  escitalopram (LEXAPRO) 10 MG tablet Take 1 tablet (10 mg total) by mouth daily. Patient not taking: Reported on 10/05/2019 06/27/19   Cain Saupe, MD  ferrous sulfate 324 (65 Fe) MG TBEC 1 pill daily after meal to help with anemia 11/22/18   [name], [name], MD  fexofenadine (ALLEGRA) 180 MG tablet Take 180 mg by mouth daily as needed for allergies or rhinitis.     [provider]  fluticasone (FLONASE) 50 MCG/ACT nasal spray Place 1 spray into both nostrils daily.    [provider]  HYDROcodone-homatropine (HYCODAN) 5-1.5 MG/5ML syrup Take 5 mLs by mouth every 6 (six) hours as needed for cough. 10/14/19   Moshe Cipro, NP  ibuprofen (ADVIL) 600 MG tablet Take 1 tablet (600 mg total) by mouth every 6 (six) hours as needed. 04/27/19   Elpidio Anis, PA-C  methocarbamol (ROBAXIN) 500 MG tablet Take 1 tablet (500 mg total) by mouth 2 (two) times daily. 12/11/19    Lowana Hable S, PA-C  methylPREDNISolone (MEDROL DOSEPAK) 4 MG TBPK tablet Take according to the Dosepak 12/11/19   Marcus Schwandt S, PA-C  montelukast (SINGULAIR) 10 MG tablet Take 1 tablet (10 mg total) by mouth at bedtime. 10/14/19   Moshe Cipro, NP  omeprazole (PRILOSEC) 40 MG capsule Take 1 capsule (40 mg  total) by mouth 2 (two) times daily. To reduce stomach acid 06/13/19   [name], [name], MD  predniSONE (STERAPRED UNI-PAK 21 TAB) 10 MG (21) TBPK tablet Take by mouth daily. Take 6 tabs by mouth daily  for 2 days, then 5 tabs for 2 days, then 4 tabs for 2 days, then 3 tabs for 2 days, 2 tabs for 2 days, then 1 tab by mouth daily for 2 days 10/25/19   McDonald, Mia A, PA-C  SUMAtriptan (IMITREX) 50 MG tablet Take 1 tablet (50 mg total) by mouth once as needed (at the onset of a migraine- may repeat once in 2 hours if headache persists or recurs). 11/22/18   Hoy Register, MD  topiramate (TOPAMAX) 50 MG tablet Take 1 tablet (50 mg total) by mouth 2 (two) times daily. 11/22/18   Hoy Register, MD  cetirizine (ZYRTEC) 10 MG tablet Take 1 tablet (10 mg total) by mouth daily. Patient not taking: Reported on 09/11/2018 12/07/17 11/05/18  Judeth Horn, NP  dicyclomine (BENTYL) 10 MG capsule Take 1 capsule (10 mg total) by mouth 4 (four) times daily -  before meals and at bedtime. For stomach cramping Patient not taking: Reported on 10/25/2019 06/13/19 10/25/19  [name], Hewitt Shorts, MD  sucralfate (CARAFATE) 1 GM/10ML suspension Take 10 mLs (1 g total) by mouth 4 (four) times daily -  with meals and at bedtime. Patient not taking: Reported on 10/25/2019 06/13/19 10/25/19  Cain Saupe, MD    Allergies    Latex, Other, Ondansetron, Tree extract, and Zofran  Review of Systems   Review of Systems  Constitutional: Negative for chills and fever.  Respiratory: Positive for shortness of breath.   Cardiovascular: Positive for chest pain.  Gastrointestinal: Positive for nausea. Negative for abdominal pain and  vomiting.  Genitourinary: Negative for dysuria and frequency.  Musculoskeletal: Positive for back pain.  All other systems reviewed and are negative.   Physical Exam Updated Vital Signs BP (!) 124/96   Pulse 87   Temp 98.2 F (36.8 C)   Resp 20   LMP 11/27/2019 (Approximate)   SpO2 100%   Physical Exam Vitals and nursing note reviewed.  Constitutional:      Appearance: She is well-developed.  HENT:     Head: Normocephalic and atraumatic.  Eyes:     Conjunctiva/sclera: Conjunctivae normal.  Cardiovascular:     Rate and Rhythm: Normal rate and regular rhythm.     Heart sounds: Normal heart sounds. No murmur heard.   Pulmonary:     Effort: Pulmonary effort is normal. No respiratory distress.     Breath sounds: Normal breath sounds. No wheezing or rales.  Abdominal:     General: Bowel sounds are normal. There is no distension.     Palpations: Abdomen is soft.     Tenderness: There is no abdominal tenderness.  Musculoskeletal:        General: No tenderness or deformity.     Cervical back: Normal and neck supple.     Thoracic back: Normal.     Lumbar back: No signs of trauma or bony tenderness. Normal range of motion. Positive left straight leg raise test.  Skin:    General: Skin is warm and dry.     Findings: No erythema or rash.  Neurological:     Mental Status: She is alert and oriented to person, place, and time.  Psychiatric:        Behavior: Behavior normal.     ED Results / Procedures / Treatments  Labs (all labs ordered are listed, but only abnormal results are displayed) Labs Reviewed  COMPREHENSIVE METABOLIC PANEL - Abnormal; Notable for the following components:      Result Value   Albumin 3.3 (*)    AST 14 (*)    Total Bilirubin 0.1 (*)    All other components within normal limits  CBC - Abnormal; Notable for the following components:   Hemoglobin 11.3 (*)    MCH 25.1 (*)    All other components within normal limits  URINALYSIS, ROUTINE W REFLEX  MICROSCOPIC - Abnormal; Notable for the following components:   Hgb urine dipstick SMALL (*)    All other components within normal limits  LIPASE, BLOOD  I-STAT BETA HCG BLOOD, ED ([facility], [facility], [facility] ONLY)    EKG None  Radiology DG Chest 2 View  Result Date: 12/11/2019 CLINICAL DATA:  Chest pain short of breath EXAM: CHEST - 2 VIEW COMPARISON:  10/25/2019 FINDINGS: The heart size and mediastinal contours are within normal limits. Both lungs are clear. The visualized skeletal structures are unremarkable. IMPRESSION: No active cardiopulmonary disease. Electronically Signed   By: Jasmine PangKim  Fujinaga M.D.   On: 12/11/2019 20:01    Procedures Procedures (including critical care time)  Medications Ordered in ED Medications  sodium chloride flush (NS) 0.9 % injection 3 mL (3 mLs Intravenous Not Given 12/11/19 2036)  lidocaine (LIDODERM) 5 % 1 patch (1 patch Transdermal Patch Applied 12/11/19 2045)  oxyCODONE-acetaminophen (PERCOCET/ROXICET) 5-325 MG per tablet 1 tablet (has no administration in time range)  cyclobenzaprine (FLEXERIL) tablet 5 mg (5 mg Oral Given 12/11/19 2043)    ED Course  I have reviewed the triage vital signs and the nursing notes.  Pertinent labs & imaging results that were available during my care of the patient were reviewed by me and considered in my medical decision making (see chart for details).    MDM Rules/Calculators/A&P                          Patient presents with multiple complaints.  She is complaining of acute on chronic back pain, chest pain, shortness of breath, itching and nausea.  Her physical exam is reassuring.  Her lungs are clear to auscultation, abdomen is soft and nontender.  Back is nontender.  She does have positive straight leg raise on the left.  No indication for imaging at this time.  She denies any urinary symptoms.  She denies any bowel or bladder incontinence, saddle anesthesias.  History and physical is not consistent with cauda equina syndrome,  epidural abscess.  Physical most consistent with acute on chronic back pain.  Regards to her chest pain shortness of breath she had an EKG which was normal and a chest x-ray which showed no acute abnormalities.  History and physicals are consistent with PE, ACS, dissection.  Regards to patient's nausea she has been p.o. tolerant.  Had a long discussion with the patient about her acute on chronic back pain and how she must follow-up with orthopedics.  She states that nothing is helped her in the past including muscle relaxers, NSAIDs, steroids and narcotics.  She was given a lidocaine in the patch which she states made her symptoms worse.  Will trial a different muscle relaxer and steroids.  However she understands she is to follow-up outpatient.  She was given return precautions.  She is ready and stable for discharge.    At this time there does not appear to  be any evidence of an acute emergency medical condition and the patient appears stable for discharge with appropriate outpatient follow up.Diagnosis was discussed with patient who verbalizes understanding and is agreeable to discharge.    Final Clinical Impression(s) / ED Diagnoses Final diagnoses:  Acute exacerbation of chronic low back pain    Rx / DC Orders ED Discharge Orders         Ordered    methocarbamol (ROBAXIN) 500 MG tablet  2 times daily     Discontinue  Reprint     12/11/19 2141    methylPREDNISolone (MEDROL DOSEPAK) 4 MG TBPK tablet     Discontinue  Reprint     12/11/19 2141           Clayborne Artist, PA-C 12/11/19 2306    Charlynne Pander, MD 12/12/19 1453

## 2019-12-11 NOTE — Discharge Instructions (Signed)
Take Medrol Dosepak as prescribed.  Do not take Flexeril.  Take Robaxin as needed for muscle spasms.  Follow-up with orthopedics as soon as possible for continued evaluation.  Return to the emergency room immediately for new or worsening symptoms or concerns such as numbness of the groin, difficulty moving your legs, fevers or any concerns at all.

## 2019-12-14 ENCOUNTER — Emergency Department (HOSPITAL_BASED_OUTPATIENT_CLINIC_OR_DEPARTMENT_OTHER)
Admission: EM | Admit: 2019-12-14 | Discharge: 2019-12-14 | Disposition: A | Discharge status: Home / Self Care | Payer: Medicaid Other | Attending: Emergency Medicine | Admitting: Emergency Medicine

## 2019-12-14 ENCOUNTER — Emergency Department (HOSPITAL_BASED_OUTPATIENT_CLINIC_OR_DEPARTMENT_OTHER): Payer: Medicaid Other

## 2019-12-14 ENCOUNTER — Ambulatory Visit: Payer: Medicaid Other | Attending: Nurse Practitioner | Admitting: Nurse Practitioner

## 2019-12-14 ENCOUNTER — Encounter: Payer: Self-pay | Admitting: Nurse Practitioner

## 2019-12-14 ENCOUNTER — Encounter (HOSPITAL_BASED_OUTPATIENT_CLINIC_OR_DEPARTMENT_OTHER): Payer: Self-pay

## 2019-12-14 ENCOUNTER — Other Ambulatory Visit: Payer: Self-pay

## 2019-12-14 DIAGNOSIS — J45909 Unspecified asthma, uncomplicated: Secondary | ICD-10-CM | POA: Insufficient documentation

## 2019-12-14 DIAGNOSIS — Z3202 Encounter for pregnancy test, result negative: Secondary | ICD-10-CM | POA: Insufficient documentation

## 2019-12-14 DIAGNOSIS — M545 Low back pain, unspecified: Secondary | ICD-10-CM

## 2019-12-14 DIAGNOSIS — G8929 Other chronic pain: Secondary | ICD-10-CM

## 2019-12-14 DIAGNOSIS — L299 Pruritus, unspecified: Secondary | ICD-10-CM

## 2019-12-14 DIAGNOSIS — Z7951 Long term (current) use of inhaled steroids: Secondary | ICD-10-CM | POA: Diagnosis not present

## 2019-12-14 DIAGNOSIS — Z91040 Latex allergy status: Secondary | ICD-10-CM | POA: Diagnosis not present

## 2019-12-14 LAB — URINALYSIS, MICROSCOPIC (REFLEX)

## 2019-12-14 LAB — URINALYSIS, ROUTINE W REFLEX MICROSCOPIC
Bilirubin Urine: NEGATIVE
Glucose, UA: NEGATIVE mg/dL
Ketones, ur: NEGATIVE mg/dL
Leukocytes,Ua: NEGATIVE
Nitrite: NEGATIVE
Protein, ur: NEGATIVE mg/dL
Specific Gravity, Urine: 1.02 (ref 1.005–1.030)
pH: 6 (ref 5.0–8.0)

## 2019-12-14 LAB — PREGNANCY, URINE: Preg Test, Ur: NEGATIVE

## 2019-12-14 MED ORDER — DULOXETINE HCL 30 MG PO CPEP: 30.0000 mg | ORAL_CAPSULE | Freq: Every day | ORAL | 1 refills | Status: DC

## 2019-12-14 MED ORDER — MELOXICAM 15 MG PO TABS
15.0000 mg | ORAL_TABLET | Freq: Every day | ORAL | 0 refills | Status: DC
Start: 2019-12-14 — End: 2019-12-14

## 2019-12-14 MED ORDER — MELOXICAM 15 MG PO TABS
15.0000 mg | ORAL_TABLET | Freq: Every day | ORAL | 0 refills | Status: DC
Start: 2019-12-14 — End: 2020-01-02

## 2019-12-14 MED ORDER — TIZANIDINE HCL 4 MG PO CAPS: 4.0000 mg | ORAL_CAPSULE | Freq: Three times a day (TID) | ORAL | 0 refills | Status: AC | PRN

## 2019-12-14 MED ORDER — KETOROLAC TROMETHAMINE 15 MG/ML IJ SOLN
30.0000 mg | Freq: Once | INTRAMUSCULAR | Status: AC
  Administered 2019-12-14: 30 mg via INTRAMUSCULAR
  Filled 2019-12-14: qty 2

## 2019-12-14 NOTE — ED Provider Notes (Signed)
[facility] EMERGENCY DEPARTMENT Provider Note   CSN: [id number] Arrival date & time: 12/14/19  1128     History Chief Complaint  Patient presents with  . Back Pain    Laurie Reed is a 34 y.o. female.  Patient with history of chronic lower back pain presents emergency department with worsening pain over the past 8 days.  Patient was seen in the emergency department on 12/11/2019 for similar symptoms.  At that time she was started on a course of steroids and given Robaxin.  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  She does a lot of heavy lifting, pushing and pulling.  She has been taking NSAIDs, muscle relaxers without any improvement.  Pain is worse with palpation and movement.  After her previous ED visit, she followed up with orthopedic referral and has an appointment upcoming in about a week.  She presents today with persistent symptoms.  Patient denies warning symptoms of back pain including: fecal incontinence, urinary retention or overflow incontinence, night sweats, waking from sleep with back pain, unexplained fevers or weight loss, h/o cancer, IVDU, recent trauma.  Earlier in the week she reports having vomiting and chest pain however these symptoms have improved.        Past Medical History:  Diagnosis Date  . Acid reflux 2004  . Anemia   . Anxiety 2008  . Asthma 1993   no previous intubations or hospitalizations   . Chlamydia   . Depression 2008   no meds, currenly ok  . Environmental allergies   . GERD (gastroesophageal reflux disease)   . Migraines   . Miscarriage   . Ovarian cyst   . Pre-diabetes   . Ulcer of the stomach and intestine 2004  . Urinary tract infection     Patient Active Problem List   Diagnosis Date Noted  . Anemia in pregnancy 01/23/2018  . Morbid obesity with BMI of 45.0-49.9, adult (HCC) 10/19/2017  . Seasonal allergic rhinitis 11/01/2016  . Asthma 06/30/2015  . Vitamin D deficiency  01/22/2015  . Anxiety and depression 01/21/2015    Past Surgical History:  Procedure Laterality Date  . COLONOSCOPY  2004   . IRRIGATION AND DEBRIDEMENT SEBACEOUS CYST N/A 06/01/2017   Procedure: EXCISION SEBACEOUS CYST ON SCALP;  Surgeon: Ancil Linsey, MD;  Location: [facility] ORS;  Service: General;  Laterality: N/A;  . MULTIPLE TOOTH EXTRACTIONS  2015   6 teeth removed   . TUBAL LIGATION N/A 04/04/2018   Procedure: POST PARTUM TUBAL LIGATION;  Surgeon: Levie Heritage, DO;  Location: [facility];  Service: Gynecology;  Laterality: N/A;  . WISDOM TOOTH EXTRACTION  2007     OB History    Gravida  7   Para  4   Term  4   Preterm  0   AB  3   Living  4     SAB  3   TAB  0   Ectopic  0   Multiple  0   Live Births  4           Family History  Problem Relation Age of Onset  . Asthma Mother   . Heart disease Mother   . Hypertension Mother   . Clotting disorder Mother   . Diabetes Mother   . Asthma Maternal Aunt   . Hypertension Maternal Aunt   . Diabetes Maternal Aunt   . Asthma Maternal Uncle   . Hypertension Maternal Uncle   .  Diabetes Maternal Uncle   . Heart disease Maternal Grandmother   . Diabetes Maternal Grandmother   . Hypertension Maternal Grandmother   . Heart disease Maternal Grandfather   . Diabetes Maternal Grandfather   . Hypertension Maternal Grandfather   . Hypertension Paternal Grandmother   . Diabetes Maternal Uncle   . Heart disease Maternal Uncle   . Diabetes Maternal Uncle   . Cancer Paternal Aunt   . Hypotension Neg Hx   . Anesthesia problems Neg Hx   . Malignant hyperthermia Neg Hx   . Pseudochol deficiency Neg Hx     Social History   Tobacco Use  . Smoking status: Never Smoker  . Smokeless tobacco: Never Used  Vaping Use  . Vaping Use: Never used  Substance Use Topics  . Alcohol use: No    Alcohol/week: 0.0 standard drinks  . Drug use: No    Home Medications Prior to Admission medications   Medication  Sig Start Date End Date Taking? Authorizing Provider  acetaminophen (TYLENOL) 500 MG tablet Take 500-1,000 mg by mouth every 6 (six) hours as needed for mild pain, moderate pain or headache (or migraines).     [provider]  albuterol (PROVENTIL) (2.5 MG/3ML) 0.083% nebulizer solution Take 3 mLs (2.5 mg total) by nebulization every 6 (six) hours as needed for wheezing or shortness of breath. 10/14/19   Moshe Cipro, NP  albuterol (VENTOLIN HFA) 108 (90 Base) MCG/ACT inhaler Inhale 1-2 puffs into the lungs every 6 (six) hours as needed for wheezing or shortness of breath.    [provider]  diphenhydrAMINE (BENADRYL) 25 mg capsule Take 25-50 mg by mouth 2 (two) times daily as needed for allergies.    [provider]  DULoxetine (CYMBALTA) 30 MG capsule Take 1 capsule (30 mg total) by mouth daily. 12/14/19 01/13/20  Claiborne Rigg, NP  escitalopram (LEXAPRO) 10 MG tablet Take 1 tablet (10 mg total) by mouth daily. Patient not taking: Reported on 10/05/2019 06/27/19   Cain Saupe, MD  ferrous sulfate 324 (65 Fe) MG TBEC 1 pill daily after meal to help with anemia 11/22/18   [name], [name], MD  fexofenadine (ALLEGRA) 180 MG tablet Take 180 mg by mouth daily as needed for allergies or rhinitis.     [provider]  fluticasone (FLONASE) 50 MCG/ACT nasal spray Place 1 spray into both nostrils daily.    [provider]  meloxicam (MOBIC) 15 MG tablet Take 1 tablet (15 mg total) by mouth daily. 12/14/19   Renne Crigler, PA-C  methylPREDNISolone (MEDROL DOSEPAK) 4 MG TBPK tablet Take according to the Dosepak 12/11/19   Kendrick, Caitlyn S, PA-C  montelukast (SINGULAIR) 10 MG tablet Take 1 tablet (10 mg total) by mouth at bedtime. 10/14/19   Moshe Cipro, NP  omeprazole (PRILOSEC) 40 MG capsule Take 1 capsule (40 mg total) by mouth 2 (two) times daily. To reduce stomach acid 06/13/19   [name], [name], MD  predniSONE (STERAPRED UNI-PAK 21 TAB) 10 MG (21) TBPK  tablet Take by mouth daily. Take 6 tabs by mouth daily  for 2 days, then 5 tabs for 2 days, then 4 tabs for 2 days, then 3 tabs for 2 days, 2 tabs for 2 days, then 1 tab by mouth daily for 2 days 10/25/19   McDonald, Mia A, PA-C  SUMAtriptan (IMITREX) 50 MG tablet Take 1 tablet (50 mg total) by mouth once as needed (at the onset of a migraine- may repeat once in 2 hours if  headache persists or recurs). 11/22/18   Hoy Register, MD  tiZANidine (ZANAFLEX) 4 MG capsule Take 1 capsule (4 mg total) by mouth 3 (three) times daily as needed for muscle spasms. 12/14/19 01/13/20  Claiborne Rigg, NP  topiramate (TOPAMAX) 50 MG tablet Take 1 tablet (50 mg total) by mouth 2 (two) times daily. 11/22/18   Hoy Register, MD  cetirizine (ZYRTEC) 10 MG tablet Take 1 tablet (10 mg total) by mouth daily. Patient not taking: Reported on 09/11/2018 12/07/17 11/05/18  Judeth Horn, NP  dicyclomine (BENTYL) 10 MG capsule Take 1 capsule (10 mg total) by mouth 4 (four) times daily -  before meals and at bedtime. For stomach cramping Patient not taking: Reported on 10/25/2019 06/13/19 10/25/19  [name], Hewitt Shorts, MD  sucralfate (CARAFATE) 1 GM/10ML suspension Take 10 mLs (1 g total) by mouth 4 (four) times daily -  with meals and at bedtime. Patient not taking: Reported on 10/25/2019 06/13/19 10/25/19  Cain Saupe, MD    Allergies    Latex, Other, Ondansetron, Tree extract, and Zofran  Review of Systems   Review of Systems  Constitutional: Negative for fever and unexpected weight change.  Gastrointestinal: Negative for constipation.       Negative for fecal incontinence.   Genitourinary: Negative for dysuria, flank pain, hematuria, pelvic pain, vaginal bleeding and vaginal discharge.       Negative for urinary incontinence or retention.  Musculoskeletal: Positive for back pain.  Neurological: Negative for weakness and numbness.       Denies saddle paresthesias.    Physical Exam Updated Vital Signs BP (!) 100/46   Pulse 56    Temp 98.5 F (36.9 C) (Oral)   Resp 16   Ht 5\' 2"  (1.575 m)   Wt (!) 125.6 kg   LMP 12/04/2019   SpO2 100%   BMI 50.66 kg/m   Physical Exam Vitals and nursing note reviewed.  Constitutional:      Appearance: She is well-developed.  HENT:     Head: Normocephalic and atraumatic.  Eyes:     Conjunctiva/sclera: Conjunctivae normal.  Pulmonary:     Effort: Pulmonary effort is normal.  Abdominal:     Palpations: Abdomen is soft.     Tenderness: There is no abdominal tenderness.  Musculoskeletal:        General: Normal range of motion.     Cervical back: Normal range of motion and neck supple.     Comments: No step-off noted with palpation of spine.  Patient winces with palpation over the mid and paraspinous musculature of the upper and mid lumbar spine.  No focal SI tenderness.  Skin:    General: Skin is warm and dry.     Findings: No rash.  Neurological:     Mental Status: She is alert.     Sensory: No sensory deficit.     Deep Tendon Reflexes: Reflexes are normal and symmetric.     Comments: 5/5 strength in entire lower extremities bilaterally. No sensation deficit.      ED Results / Procedures / Treatments   Labs (all labs ordered are listed, but only abnormal results are displayed) Labs Reviewed  URINALYSIS, ROUTINE W REFLEX MICROSCOPIC - Abnormal; Notable for the following components:      Result Value   Hgb urine dipstick SMALL (*)    All other components within normal limits  URINALYSIS, MICROSCOPIC (REFLEX) - Abnormal; Notable for the following components:   Bacteria, UA MANY (*)    All other  components within normal limits  PREGNANCY, URINE    EKG None  Radiology DG Lumbar Spine Complete  Result Date: 12/14/2019 CLINICAL DATA:  Left-sided low back pain and left leg pain. 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 This differs from the numbering  terminology employed on the MRI March 2018. There is mild curvature convex to the left. No antero or retrolisthesis. No disc narrowing seen at L4-5 or above. The L5-S1 disc is transitional and rudimentary. 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 These could be a cause of localized pain or referred pain. 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 Electronically Signed   By: Paulina Fusi M.D.   On: 12/14/2019 15:01    Procedures Procedures (including critical care time)  Medications Ordered in ED Medications  ketorolac (TORADOL) 15 MG/ML injection 30 mg (30 mg Intramuscular Given 12/14/19 1542)    ED Course  I have reviewed the triage vital signs and the nursing notes.  Pertinent labs & imaging results that were available during my care of the patient were reviewed by me and considered in my medical decision making (see chart for details).  3:44 PM Patient seen and examined. Reviewed x-ray and previous ED visit records.  Vital signs reviewed and are as follows: Vitals:   12/14/19 1428 12/14/19 1534  BP: (!) 100/46   Pulse: 56   Resp: 16   Temp:  98.5 F (36.9 C)  SpO2: 100%     No red flag s/s of low back pain. Patient was counseled on back pain precautions and told to do activity as tolerated but do not lift, push, or pull heavy objects more than 10 pounds for the next week.  Patient counseled to use ice or heat on back for no longer than 15 minutes every hour.   Patient is already on multiple therapies.  We will have her discontinue naproxen/ibuprofen and  take meloxicam.  She will hopefully benefit from having orthopedic evaluation.  Strongly encourage PCP follow-up.  Patient urged to follow-up with PCP if pain does not improve with treatment and rest or if pain becomes recurrent. Urged to return with worsening severe pain, loss of bowel or bladder control, trouble walking.   The patient verbalizes understanding and agrees with the plan.    MDM Rules/Calculators/A&P                          Patient with back pain, likely musculoskeletal in nature. No neurological deficits.  Patient is obviously having a difficult time with this and is not responding well to typical treatment with muscle relaxers and anti-inflammatories.  She has a job where she has very hard on her back and has signs of arthritis on her x-ray today.  Patient is ambulatory. No warning symptoms of back pain including: fecal incontinence, urinary retention or overflow incontinence, night sweats, waking from sleep with back pain, unexplained fevers or weight loss, h/o cancer, IVDU, recent trauma. No concern for cauda equina, epidural abscess, or other serious cause of back pain.  Conservative measures such as rest, ice/heat and pain medicine indicated with PCP follow-up if no improvement with conservative management.   Final Clinical Impression(s) / ED Diagnoses Final diagnoses:  Acute midline low back pain without sciatica    Rx / DC Orders ED Discharge Orders         Ordered    meloxicam (MOBIC) 15 MG tablet  Daily,   Status:  Discontinued     Reprint     12/14/19 1538    meloxicam (MOBIC) 15 MG tablet  Daily     Discontinue  Reprint     12/14/19 1538           Renne CriglerGeiple, Jaymi Tinner, PA-C 12/14/19 1546    Tegeler, Canary Brimhristopher J, MD 12/15/19 [phone]

## 2019-12-14 NOTE — ED Notes (Signed)
AVS along with Rx reviewed with pt, also reeinforced keeping follow up appt per recommendations of ED provider today, opportunity for questions provided.

## 2019-12-14 NOTE — ED Notes (Signed)
ED Provider at bedside. 

## 2019-12-14 NOTE — Discharge Instructions (Signed)
Please read and follow all provided instructions.  Your diagnoses today include:  1. Acute midline low back pain without sciatica     Tests performed today include:  Vital signs - see below for your results today  X-ray of your lower back - shows arthritis, no broken bones  Medications prescribed:   Meloxicam - anti-inflammatory pain medication  You have been prescribed an anti-inflammatory medication or NSAID. Take with food. Do not take aspirin, ibuprofen, or naproxen if taking this medication. Take smallest effective dose for the shortest duration needed for your pain. Stop taking if you experience stomach pain or vomiting.   Take any prescribed medications only as directed.  Home care instructions:   Follow any educational materials contained in this packet  Please rest, use ice or heat on your back for the next several days  Do not lift, push, pull anything more than 10 pounds for the next week  Follow-up instructions: Please follow-up with the orthopedist as scheduled.  Return instructions:  SEEK IMMEDIATE MEDICAL ATTENTION IF YOU HAVE:  New numbness, tingling, weakness, or problem with the use of your arms or legs  Severe back pain not relieved with medications  Loss control of your bowels or bladder  Increasing pain in any areas of the body (such as chest or abdominal pain)  Shortness of breath, dizziness, or fainting.   Worsening nausea (feeling sick to your stomach), vomiting, fever, or sweats  Any other emergent concerns regarding your health   Additional Information:  Your vital signs today were: BP (!) 100/46    Pulse 56    Temp 98.5 F (36.9 C) (Oral)    Resp 16    Ht 5\' 2"  (1.575 m)    Wt (!) 125.6 kg    LMP 12/04/2019    SpO2 100%    BMI 50.66 kg/m  If your blood pressure (BP) was elevated above 135/85 this visit, please have this repeated by your doctor within one month. --------------

## 2019-12-14 NOTE — ED Triage Notes (Addendum)
Pt c/o mid/lower back pain x 8 days-denies injury-pain worse with movement-states she was seen at [facility] ED for same last week-no relief with rx med-NAD-steady gait

## 2019-12-14 NOTE — Progress Notes (Signed)
Virtual Visit via Telephone Note Due to national recommendations of social distancing due to COVID 19, telehealth visit is felt to be most appropriate for this patient at this time.  I discussed the limitations, risks, security and privacy concerns of performing an evaluation and management service by telephone and the availability of in person appointments. I also discussed with the patient that there may be a patient responsible charge related to this service. The patient expressed understanding and agreed to proceed.    I connected with Laurie Reed on 12/14/19  at  10:30 AM EDT  EDT by telephone and verified that I am speaking with the correct person using two identifiers.   Consent I discussed the limitations, risks, security and privacy concerns of performing an evaluation and management service by telephone and the availability of in person appointments. I also discussed with the patient that there may be a patient responsible charge related to this service. The patient expressed understanding and agreed to proceed.   Location of Patient: Private Residence   Location of Provider: [facility]    Persons participating in Telemedicine visit: Bertram Denver FNP-BC YY Volcano CMA Tyreesha Montgomery    History of Present Illness: Telemedicine visit for:  Back pain and pruritis.  has a past medical history of Acid reflux (2004), Anemia, Anxiety (2008), Asthma (1993), Chlamydia, Depression (2008), Environmental allergies, GERD (gastroesophageal reflux disease), Migraines, Miscarriage, Ovarian cyst, Pre-diabetes, Ulcer of the stomach and intestine (2004), and Urinary tract infection.  Chronic Back pain.  BMI 50. States she can not return to work with her current back pain. has been out of work since last week. She called the office today for an appointment after already being scheduled to see ortho next week. I instructed her that I would not be able to give  her a letter for being out of work last week as she did not call this office regarding her back pain last week. She was seen in the ED at [facility] on 7-28. She stated at that time she had been experiencing back pain since 2012. She has not had an xray. Will need to follow up with orthopedics. Although she was only started on prednisone and robaxin she states neither one are helping to provide pain relief. Previous treatments tried: meloxicam, lidocaine patch,  flexeril, steroids. Will switch flexeril to zanaflex today and add cymbalta. Swtiched flexeril to robaxin. Unchanged pain. Mid to lower back. Will likely need physical therapy. She has not been wearing a back brace which I did recommend.   SKIN PROBLEM Reports itching of the skin on her back and abdomen. Onset a few weeks ago. States she has not changed any detergents or soaps over the past few weeks. Denies any rashes.   Past Medical History:  Diagnosis Date  . Acid reflux 2004  . Anemia   . Anxiety 2008  . Asthma 1993   no previous intubations or hospitalizations   . Chlamydia   . Depression 2008   no meds, currenly ok  . Environmental allergies   . GERD (gastroesophageal reflux disease)   . Migraines   . Miscarriage   . Ovarian cyst   . Pre-diabetes   . Ulcer of the stomach and intestine 2004  . Urinary tract infection     Past Surgical History:  Procedure Laterality Date  . COLONOSCOPY  2004   . IRRIGATION AND DEBRIDEMENT SEBACEOUS CYST N/A 06/01/2017   Procedure: EXCISION SEBACEOUS CYST ON SCALP;  Surgeon: Satira Mccallum  Clayburn Pert, MD;  Location: [facility] ORS;  Service: General;  Laterality: N/A;  . MULTIPLE TOOTH EXTRACTIONS  2015   6 teeth removed   . TUBAL LIGATION N/A 04/04/2018   Procedure: POST PARTUM TUBAL LIGATION;  Surgeon: Levie Heritage, DO;  Location: [facility];  Service: Gynecology;  Laterality: N/A;  . WISDOM TOOTH EXTRACTION  2007    Family History  Problem Relation Age of Onset  . Asthma Mother   . Heart  disease Mother   . Hypertension Mother   . Clotting disorder Mother   . Diabetes Mother   . Asthma Maternal Aunt   . Hypertension Maternal Aunt   . Diabetes Maternal Aunt   . Asthma Maternal Uncle   . Hypertension Maternal Uncle   . Diabetes Maternal Uncle   . Heart disease Maternal Grandmother   . Diabetes Maternal Grandmother   . Hypertension Maternal Grandmother   . Heart disease Maternal Grandfather   . Diabetes Maternal Grandfather   . Hypertension Maternal Grandfather   . Hypertension Paternal Grandmother   . Diabetes Maternal Uncle   . Heart disease Maternal Uncle   . Diabetes Maternal Uncle   . Cancer Paternal Aunt   . Hypotension Neg Hx   . Anesthesia problems Neg Hx   . Malignant hyperthermia Neg Hx   . Pseudochol deficiency Neg Hx     Social History   Socioeconomic History  . Marital status: Divorced    Spouse name: Not on file  . Number of children: 4  . Years of education: 24   . Highest education level: Not on file  Occupational History    Employer: [employer]  Tobacco Use  . Smoking status: Never Smoker  . Smokeless tobacco: Never Used  Vaping Use  . Vaping Use: Never used  Substance and Sexual Activity  . Alcohol use: No    Alcohol/week: 0.0 standard drinks  . Drug use: No  . Sexual activity: Not on file  Other Topics Concern  . Not on file  Social History Narrative   Lives with 3 children.   Immediate family in [place].    Born in [place].   Raised in [place], [place].    Lives in a 2 story home.   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 Difficulty of Paying Living Expenses:   Food Insecurity:   . Worried About Programme researcher, broadcasting/film/video in the Last Year:   . Barista in the Last Year:   Transportation Needs:   . Freight forwarder (Medical):   Marland Kitchen Lack of Transportation (Non-Medical):   Physical Activity:   . Days of Exercise per Week:   .  Minutes of Exercise per Session:   Stress:   . Feeling of Stress :   Social Connections:   . Frequency of Communication with Friends and Family:   . Frequency of Social Gatherings with Friends and Family:   . Attends Religious Services:   . Active Member of Clubs or Organizations:   . Attends Banker Meetings:   Marland Kitchen Marital Status:      Observations/Objective: Awake, alert and oriented x 3   Review of Systems  Constitutional: Negative for fever, malaise/fatigue and weight loss.  HENT: Negative.  Negative for nosebleeds.   Eyes: Negative.  Negative for blurred vision, double vision and photophobia.  Respiratory: Negative.  Negative for  cough and shortness of breath.   Cardiovascular: Negative.  Negative for chest pain, palpitations and leg swelling.  Gastrointestinal: Negative.  Negative for heartburn, nausea and vomiting.  Musculoskeletal: Positive for back pain. Negative for myalgias.  Skin: Positive for itching. Negative for rash.  Neurological: Negative.  Negative for dizziness, focal weakness, seizures and headaches.  Psychiatric/Behavioral: Negative.  Negative for suicidal ideas.    Assessment and Plan: Nachelle was seen today for back pain.  Diagnoses and all orders for this visit:  Chronic bilateral low back pain without sciatica -     DG Lumbar Spine Complete; Future -     DULoxetine (CYMBALTA) 30 MG capsule; Take 1 capsule (30 mg total) by mouth daily. -     tiZANidine (ZANAFLEX) 4 MG capsule; Take 1 capsule (4 mg total) by mouth 3 (three) times daily as needed for muscle spasms. Work on losing weight to help reduce back pain. May alternate with heat and ice application for pain relief. May also alternate with acetaminophen and Ibuprofen as prescribed for back pain. Other alternatives include massage, acupuncture and water aerobics.  You must stay active and avoid a sedentary lifestyle.   Pruritic condition -     hydrOXYzine (ATARAX/VISTARIL) 10 MG tablet;  Take 1 tablet (10 mg total) by mouth every 6 (six) hours as needed for itching.     Follow Up Instructions Return if symptoms worsen or fail to improve.     I discussed the assessment and treatment plan with the patient. The patient was provided an opportunity to ask questions and all were answered. The patient agreed with the plan and demonstrated an understanding of the instructions.   The patient was advised to call back or seek an in-person evaluation if the symptoms worsen or if the condition fails to improve as anticipated.  I provided 17 minutes of non-face-to-face time during this encounter including median intraservice time, reviewing previous notes, labs, imaging, medications and explaining diagnosis and management.  Claiborne Rigg, FNP-BC

## 2019-12-16 ENCOUNTER — Encounter: Payer: Self-pay | Admitting: Nurse Practitioner

## 2019-12-16 MED ORDER — HYDROXYZINE HCL 10 MG PO TABS: 10.0000 mg | ORAL_TABLET | Freq: Four times a day (QID) | ORAL | 1 refills | Status: DC | PRN

## 2019-12-26 ENCOUNTER — Encounter: Payer: Self-pay | Admitting: Orthopaedic Surgery

## 2019-12-26 ENCOUNTER — Ambulatory Visit (INDEPENDENT_AMBULATORY_CARE_PROVIDER_SITE_OTHER): Payer: Medicaid Other | Admitting: Orthopaedic Surgery

## 2019-12-26 VITALS — BP 119/79 | HR 82 | Ht 62.0 in | Wt 277.0 lb

## 2019-12-26 DIAGNOSIS — G8929 Other chronic pain: Secondary | ICD-10-CM | POA: Diagnosis not present

## 2019-12-26 DIAGNOSIS — M545 Low back pain, unspecified: Secondary | ICD-10-CM

## 2019-12-26 DIAGNOSIS — Z6841 Body Mass Index (BMI) 40.0 and over, adult: Secondary | ICD-10-CM

## 2019-12-26 NOTE — Progress Notes (Signed)
Office Visit Note   Patient: Laurie Reed           Date of Birth: 1985/11/10           MRN: [id number] Visit Date: 12/26/2019              Requested by: Cain Saupe, MD [address] [place],  [place] [address] PCP: Cain Saupe, MD   Assessment & Plan: Visit Diagnoses:  1. Body mass index 50.0-59.9, adult (HCC)   2. Chronic low back pain without sciatica, unspecified back pain laterality     Plan: Patient has chronic back pain with normal MRI scan for age no compression.  She has some increased adipose tissue interspersed in the muscle and is caring least an extra 80 pounds more than she needs which is aggravating her knee problems as well as her back.  We will set up for some physical therapy for home exercise program with 4 children that she is taking care of is a single parent.  We discussed gradual weight loss to help unload her back as well as her knees.  She can discuss with her PCP dietetic referral about dieting..  Follow-Up Instructions: Return in about 8 weeks (around 02/20/2020).   Orders:  No orders of the defined types were placed in this encounter.  No orders of the defined types were placed in this encounter.     Procedures: No procedures performed   Clinical Data: No additional findings.   Subjective: Chief Complaint  Patient presents with  . Lower Back - Pain    HPI 34 year old female single parent with 4 children BMI 50 and chronic low back pain.  She states meloxicam really did not help she has been using tramadol at night and it does not seem to be strong enough for her.  She has had problems with her knees as well and said cortisone injections in her knees in the past.  Lumbar MRI scan done 07/15/2016 showed mild lumbar facet arthropathy with no evidence of stenosis no abnormal disc dehydration no lateral recess and no foraminal stenosis.  She is also had a cervical MRI that showed no compression.  Review of Systems All other systems are  negative as they pertain to HPI other than as mentioned above.  Objective: Vital Signs: BP 119/79   Pulse 82   Ht 5\' 2"  (1.575 m)   Wt 277 lb (125.6 kg)   LMP 12/04/2019   BMI 50.66 kg/m   Physical Exam Constitutional:      Appearance: She is well-developed.  HENT:     Head: Normocephalic.     Right Ear: External ear normal.     Left Ear: External ear normal.  Eyes:     Pupils: Pupils are equal, round, and reactive to light.  Neck:     Thyroid: No thyromegaly.     Trachea: No tracheal deviation.  Cardiovascular:     Rate and Rhythm: Normal rate.  Pulmonary:     Effort: Pulmonary effort is normal.  Abdominal:     Palpations: Abdomen is soft.  Skin:    General: Skin is warm and dry.  Neurological:     Mental Status: She is alert and oriented to person, place, and time.  Psychiatric:        Behavior: Behavior normal.     Ortho Exam negative straight leg raising.  Patient gets from sitting standing slow deliberate gait.  No knee synovitis negative logroll the hips anterior tib EHL is  active. Specialty Comments:  No specialty comments available.  Imaging: Narrative & Impression  CLINICAL DATA:  Low back pain radiating to LEFT lower extremity beginning June 19, 2006, lifting injury at work. Evaluate LEFT arm and leg weakness.  EXAM: MRI LUMBAR SPINE WITHOUT CONTRAST  TECHNIQUE: Multiplanar, multisequence MR imaging of the lumbar spine was performed. No intravenous contrast was administered.  COMPARISON:  None.  FINDINGS: SEGMENTATION: For the purposes of this report, the last well-formed intervertebral disc will be described as L5-S1, with small S1-2 disc consistent with transitional anatomy.  ALIGNMENT: Maintenance of the lumbar lordosis. No malalignment.  VERTEBRAE:Vertebral bodies are intact. Intervertebral discs demonstrate normal morphology and signal characteristics. No abnormal bone marrow signal.  CONUS MEDULLARIS: Conus medullaris  terminates at L1-2 and demonstrates normal morphology and signal characteristics. Cauda equina is normal.  PARASPINAL AND SOFT TISSUES: Included prevertebral and paraspinal soft tissues are nonacute. Prominent vessels in the pelvis suggesting pelvic congestion syndrome.  DISC LEVELS:  L1-2 through L5-S1: No disc bulge, canal stenosis nor neural foraminal narrowing. Mild lower thoracic facet arthropathy.  IMPRESSION: Mild lower lumbar facet arthropathy. Otherwise negative MRI of the lumbar spine.   Electronically Signed   By: Awilda Metro M.D.   On      PMFS History: Patient Active Problem List   Diagnosis Date Noted  . Chronic low back pain 12/26/2019  . Anemia in pregnancy 01/23/2018  . Morbid obesity with BMI of 45.0-49.9, adult (HCC) 10/19/2017  . Seasonal allergic rhinitis 11/01/2016  . Asthma 06/30/2015  . Vitamin D deficiency 01/22/2015  . Anxiety and depression 01/21/2015   Past Medical History:  Diagnosis Date  . Acid reflux 2004  . Anemia   . Anxiety 2008  . Asthma 1993   no previous intubations or hospitalizations   . Chlamydia   . Depression 2008   no meds, currenly ok  . Environmental allergies   . GERD (gastroesophageal reflux disease)   . Migraines   . Miscarriage   . Ovarian cyst   . Pre-diabetes   . Ulcer of the stomach and intestine 2004  . Urinary tract infection     Family History  Problem Relation Age of Onset  . Asthma Mother   . Heart disease Mother   . Hypertension Mother   . Clotting disorder Mother   . Diabetes Mother   . Asthma Maternal Aunt   . Hypertension Maternal Aunt   . Diabetes Maternal Aunt   . Asthma Maternal Uncle   . Hypertension Maternal Uncle   . Diabetes Maternal Uncle   . Heart disease Maternal Grandmother   . Diabetes Maternal Grandmother   . Hypertension Maternal Grandmother   . Heart disease Maternal Grandfather   . Diabetes Maternal Grandfather   . Hypertension Maternal Grandfather   .  Hypertension Paternal Grandmother   . Diabetes Maternal Uncle   . Heart disease Maternal Uncle   . Diabetes Maternal Uncle   . Cancer Paternal Aunt   . Hypotension Neg Hx   . Anesthesia problems Neg Hx   . Malignant hyperthermia Neg Hx   . Pseudochol deficiency Neg Hx     Past Surgical History:  Procedure Laterality Date  . COLONOSCOPY  2004   . IRRIGATION AND DEBRIDEMENT SEBACEOUS CYST N/A 06/01/2017   Procedure: EXCISION SEBACEOUS CYST ON SCALP;  Surgeon: Ancil Linsey, MD;  Location: [facility] ORS;  Service: General;  Laterality: N/A;  . MULTIPLE TOOTH EXTRACTIONS  2015   6 teeth removed   .  TUBAL LIGATION N/A 04/04/2018   Procedure: POST PARTUM TUBAL LIGATION;  Surgeon: Levie Heritage, DO;  Location: [facility];  Service: Gynecology;  Laterality: N/A;  . WISDOM TOOTH EXTRACTION  2007   Social History   Occupational History    Employer: [employer]  Tobacco Use  . Smoking status: Never Smoker  . Smokeless tobacco: Never Used  Vaping Use  . Vaping Use: Never used  Substance and Sexual Activity  . Alcohol use: No    Alcohol/week: 0.0 standard drinks  . Drug use: No  . Sexual activity: Not on file

## 2019-12-26 NOTE — Progress Notes (Signed)
Patient ID: Laurie Reed, female   DOB: 02-Nov-1985, 34 y.o.   MRN: [id number]   Virtual Visit via Telephone Note  I connected with Laurie Reed on 01/02/20 at  2:10 PM EDT by telephone and verified that I am speaking with the correct person using two identifiers.   I discussed the limitations, risks, security and privacy concerns of performing an evaluation and management service by telephone and the availability of in person appointments. I also discussed with the patient that there may be a patient responsible charge related to this service. The patient expressed understanding and agreed to proceed.  PATIENT visit by telephone virtually in the context of Covid-19 pandemic. Patient location:  home My Location:  [facility] office Persons on the call:  Me and the patient   History of Present Illness: After ED visit for back pain. Saw ortho 12/26/2019 and they recommended weight loss for her back pain.  C/o urinary frequency for months.  No dysuria.  No pelvic pain.  Feels like she might have a yeast infection.  Denies STD risk factors.  Concerned about diabetes.  Last A1c was 1 year ago was 5.7;  Most recently blood sugars were 95, 104, 73 respectively over the last 6 months.  No fever.  No abdominal pain.  No polyuria/polydipsia.  Admits to drinking a lot of soft drinks and juices.  Also c/o random skin itching that bothers her at times without a rash.    From ED note:  Patient with history of chronic lower back pain presents emergency department with worsening pain over the past 8 days.  Patient was seen in the emergency department on 12/11/2019 for similar symptoms.  At that time she was started on a course of steroids and given Robaxin.  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  She does a lot of heavy lifting, pushing and pulling.  She has been taking NSAIDs, muscle relaxers without any improvement.  Pain is worse with palpation and movement.  After her  previous ED visit, she followed up with orthopedic referral and has an appointment upcoming in about a week.  She presents today with persistent symptoms.  Patient denies warning symptoms of back pain including: fecal incontinence, urinary retention or overflow incontinence, night sweats, waking from sleep with back pain, unexplained fevers or weight loss, h/o cancer, IVDU, recent trauma.  Earlier in the week she reports having vomiting and chest pain however these symptoms have improved.  Patient with back pain, likely musculoskeletal in nature. No neurological deficits.  Patient is obviously having a difficult time with this and is not responding well to typical treatment with muscle relaxers and anti-inflammatories.  She has a job where she has very hard on her back and has signs of arthritis on her x-ray today.  Patient is ambulatory. No warning symptoms of back pain including: fecal incontinence, urinary retention or overflow incontinence, night sweats, waking from sleep with back pain, unexplained fevers or weight loss, h/o cancer, IVDU, recent trauma. No concern for cauda equina, epidural abscess, or other serious cause of back pain. Conservative measures such as rest, ice/heat and pain medicine indicated with PCP follow-up if no improvement with conservative management   Observations/Objective:  NAD.  A&Ox3   Assessment and Plan: 1. Urinary frequency Drink 80-100 ounces water daily.   - POCT URINALYSIS DIP (CLINITEK) - Cervicovaginal ancillary only - Urine Culture  2. Vaginal discharge - fluconazole (DIFLUCAN) 150 MG tablet; Take 1 tablet (150 mg  total) by mouth once for 1 dose.  Dispense: 1 tablet; Refill: 0 - Urine Culture  3. Chronic bilateral low back pain without sciatica - fluconazole (DIFLUCAN) 150 MG tablet; Take 1 tablet (150 mg total) by mouth once for 1 dose.  Dispense: 1 tablet; Refill: 0 - meloxicam (MOBIC) 15 MG tablet; Take 1 tablet (15 mg total) by mouth daily.  Dispense:  10 tablet; Refill: 0  4. Screening for diabetes mellitus I have had a lengthy discussion and provided education about insulin resistance and the intake of too much sugar/refined carbohydrates.  I have advised the patient to work at a goal of eliminating sugary drinks, candy, desserts, sweets, refined sugars, processed foods, and white carbohydrates.  The patient expresses understanding.  -avoid juices, soft drinks and other simple sugars/white carbs - Hemoglobin A1c  5. Itching Zyrtec Rx  6. Morbid obesity with BMI of 45.0-49.9, adult (HCC) - Amb ref to Medical Nutrition Therapy-MNT    Follow Up Instructions: See PCP in 2-3 months   I discussed the assessment and treatment plan with the patient. The patient was provided an opportunity to ask questions and all were answered. The patient agreed with the plan and demonstrated an understanding of the instructions.   The patient was advised to call back or seek an in-person evaluation if the symptoms worsen or if the condition fails to improve as anticipated.  I provided 21 minutes of non-face-to-face time during this encounter.   Georgian Co, PA-C

## 2019-12-26 NOTE — Addendum Note (Signed)
Addended by: Rogers Seeds on: 12/26/2019 12:07 PM   Modules accepted: Orders

## 2019-12-30 ENCOUNTER — Other Ambulatory Visit: Payer: Self-pay | Admitting: Nurse Practitioner

## 2019-12-30 DIAGNOSIS — L299 Pruritus, unspecified: Secondary | ICD-10-CM

## 2019-12-31 NOTE — Telephone Encounter (Signed)
   Notes to clinic:  requesting 90 day with refills   Requested Prescriptions  Pending Prescriptions Disp Refills   hydrOXYzine (ATARAX/VISTARIL) 10 MG tablet [Pharmacy Med Name: HYDROXYZINE HCL 10 MG TABLET] 360 tablet 1    Sig: Take 1 tablet (10 mg total) by mouth every 6 (six) hours as needed for itching.      Ear, Nose, and Throat:  Antihistamines Passed - 12/30/2019  9:51 PM      Passed - Valid encounter within last 12 months    Recent Outpatient Visits           2 weeks ago Chronic bilateral low back pain without sciatica   [facility] [place], [place] W, NP   4 months ago Chronic pain of left ankle   [facility] [place], [place], [place]   6 months ago Gastroesophageal reflux disease, unspecified whether esophagitis present   [facility] [name], [place], MD   6 months ago Epigastric pain   [facility] [place], [place], MD   7 months ago Acute left ankle pain   [facility] [place] Matar, MD       Future Appointments             In 2 days Sharon Seller, [place] [facility]   In 1 month Ophelia Charter, [place], MD [facility]

## 2020-01-02 ENCOUNTER — Ambulatory Visit: Payer: Medicaid Other | Attending: Physician Assistant | Admitting: Physician Assistant

## 2020-01-02 ENCOUNTER — Other Ambulatory Visit: Payer: Self-pay

## 2020-01-02 ENCOUNTER — Encounter: Payer: Self-pay | Admitting: Physician Assistant

## 2020-01-02 ENCOUNTER — Other Ambulatory Visit (HOSPITAL_COMMUNITY)
Admission: RE | Admit: 2020-01-02 | Discharge: 2020-01-02 | Disposition: A | Discharge status: Home / Self Care | Payer: Medicaid Other | Source: Ambulatory Visit | Attending: Physician Assistant | Admitting: Physician Assistant

## 2020-01-02 DIAGNOSIS — G8929 Other chronic pain: Secondary | ICD-10-CM

## 2020-01-02 DIAGNOSIS — R350 Frequency of micturition: Secondary | ICD-10-CM | POA: Insufficient documentation

## 2020-01-02 DIAGNOSIS — L299 Pruritus, unspecified: Secondary | ICD-10-CM

## 2020-01-02 DIAGNOSIS — M545 Low back pain, unspecified: Secondary | ICD-10-CM

## 2020-01-02 DIAGNOSIS — Z131 Encounter for screening for diabetes mellitus: Secondary | ICD-10-CM

## 2020-01-02 DIAGNOSIS — N898 Other specified noninflammatory disorders of vagina: Secondary | ICD-10-CM

## 2020-01-02 DIAGNOSIS — Z09 Encounter for follow-up examination after completed treatment for conditions other than malignant neoplasm: Secondary | ICD-10-CM

## 2020-01-02 DIAGNOSIS — Z6841 Body Mass Index (BMI) 40.0 and over, adult: Secondary | ICD-10-CM

## 2020-01-02 LAB — POCT URINALYSIS DIP (CLINITEK)
Bilirubin, UA: NEGATIVE
Glucose, UA: NEGATIVE mg/dL
Ketones, POC UA: NEGATIVE mg/dL
Nitrite, UA: NEGATIVE
POC PROTEIN,UA: NEGATIVE
Spec Grav, UA: 1.025 (ref 1.010–1.025)
Urobilinogen, UA: 1 E.U./dL
pH, UA: 5.5 (ref 5.0–8.0)

## 2020-01-02 MED ORDER — MELOXICAM 15 MG PO TABS: 15.0000 mg | ORAL_TABLET | Freq: Every day | ORAL | 0 refills | Status: DC

## 2020-01-02 MED ORDER — CETIRIZINE HCL 10 MG PO TABS
10.0000 mg | ORAL_TABLET | Freq: Every day | ORAL | 11 refills | Status: DC
Start: 2020-01-02 — End: 2021-12-24

## 2020-01-02 MED ORDER — FLUCONAZOLE 150 MG PO TABS: 150.0000 mg | ORAL_TABLET | Freq: Once | ORAL | 0 refills | Status: AC

## 2020-01-03 LAB — HEMOGLOBIN A1C
Est. average glucose Bld gHb Est-mCnc: 120 mg/dL
Hgb A1c MFr Bld: 5.8 % — ABNORMAL HIGH (ref 4.8–5.6)

## 2020-01-03 LAB — CERVICOVAGINAL ANCILLARY ONLY
Bacterial Vaginitis (gardnerella): POSITIVE — AB
Candida Glabrata: NEGATIVE
Candida Vaginitis: NEGATIVE
Chlamydia: NEGATIVE
Comment: NEGATIVE
Comment: NEGATIVE
Comment: NEGATIVE
Comment: NEGATIVE
Comment: NEGATIVE
Comment: NORMAL
Neisseria Gonorrhea: NEGATIVE
Trichomonas: NEGATIVE

## 2020-01-04 ENCOUNTER — Other Ambulatory Visit: Payer: Self-pay

## 2020-01-04 ENCOUNTER — Other Ambulatory Visit: Payer: Self-pay | Admitting: Nurse Practitioner

## 2020-01-04 ENCOUNTER — Encounter: Payer: Medicaid Other | Attending: Physician Assistant | Admitting: Dietician

## 2020-01-04 ENCOUNTER — Encounter: Payer: Self-pay | Admitting: Dietician

## 2020-01-04 DIAGNOSIS — Z6841 Body Mass Index (BMI) 40.0 and over, adult: Secondary | ICD-10-CM | POA: Diagnosis present

## 2020-01-04 LAB — URINE CULTURE

## 2020-01-04 MED ORDER — METRONIDAZOLE 500 MG PO TABS: 500.0000 mg | ORAL_TABLET | Freq: Two times a day (BID) | ORAL | 0 refills | Status: AC
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
## 2020-01-04 NOTE — Patient Instructions (Addendum)
Choose "Zero" soft drinks. Lite or sugar free cranberry juice and lemonades.  Keep up drinking the water!  Work towards finding a way to fit 30 minutes on the treadmill into your day. Remember the peace of mind you can get from exercise. Any time on the treadmill is a victory!  Work on meeting the balanced plate model of eating. Try to bring in more non-starchy vegetables into your daily routine.  Small steps add up to big victories!  Look at your current meals and see how they fit into the plate model.

## 2020-01-07 ENCOUNTER — Other Ambulatory Visit: Payer: Self-pay | Admitting: Physician Assistant

## 2020-01-23 ENCOUNTER — Ambulatory Visit: Payer: Medicaid Other | Attending: Orthopaedic Surgery | Admitting: Physical Therapy

## 2020-02-01 ENCOUNTER — Ambulatory Visit: Payer: Medicaid Other | Admitting: Dietician

## 2020-02-05 ENCOUNTER — Telehealth: Payer: Self-pay | Admitting: Gastroenterology

## 2020-02-05 NOTE — Telephone Encounter (Signed)
Spoke with patient, she states that she received a message from 11/02/19 to call regarding her breath test results. Advised patient that last breath test results that I could see is from 12/07/19, I read the result note that was sent to the patient by Dr. Myrtie Neither. Epic states that patient viewed the result note but patient states that she never heard anything.

## 2020-02-05 NOTE — Telephone Encounter (Signed)
Lm on vm for patient to return call 

## 2020-02-13 ENCOUNTER — Ambulatory Visit: Payer: BLUE CROSS/BLUE SHIELD | Admitting: Orthopaedic Surgery

## 2020-02-15 ENCOUNTER — Ambulatory Visit: Payer: Medicaid Other | Admitting: Dietician

## 2020-03-06 ENCOUNTER — Other Ambulatory Visit: Payer: Self-pay

## 2020-03-06 ENCOUNTER — Emergency Department (HOSPITAL_BASED_OUTPATIENT_CLINIC_OR_DEPARTMENT_OTHER)
Admission: EM | Admit: 2020-03-06 | Discharge: 2020-03-06 | Disposition: A | Discharge status: Home / Self Care | Payer: Medicaid Other | Attending: Emergency Medicine | Admitting: Emergency Medicine

## 2020-03-06 ENCOUNTER — Encounter (HOSPITAL_BASED_OUTPATIENT_CLINIC_OR_DEPARTMENT_OTHER): Payer: Self-pay

## 2020-03-06 DIAGNOSIS — J45909 Unspecified asthma, uncomplicated: Secondary | ICD-10-CM | POA: Insufficient documentation

## 2020-03-06 DIAGNOSIS — K219 Gastro-esophageal reflux disease without esophagitis: Secondary | ICD-10-CM | POA: Insufficient documentation

## 2020-03-06 DIAGNOSIS — Z79899 Other long term (current) drug therapy: Secondary | ICD-10-CM | POA: Diagnosis not present

## 2020-03-06 DIAGNOSIS — Z91040 Latex allergy status: Secondary | ICD-10-CM | POA: Diagnosis not present

## 2020-03-06 DIAGNOSIS — G8929 Other chronic pain: Secondary | ICD-10-CM | POA: Diagnosis not present

## 2020-03-06 DIAGNOSIS — R1010 Upper abdominal pain, unspecified: Secondary | ICD-10-CM | POA: Insufficient documentation

## 2020-03-06 LAB — COMPREHENSIVE METABOLIC PANEL
ALT: 13 U/L (ref 0–44)
AST: 12 U/L — ABNORMAL LOW (ref 15–41)
Albumin: 3.5 g/dL (ref 3.5–5.0)
Alkaline Phosphatase: 55 U/L (ref 38–126)
Anion gap: 10 (ref 5–15)
BUN: 12 mg/dL (ref 6–20)
CO2: 26 mmol/L (ref 22–32)
Calcium: 9 mg/dL (ref 8.9–10.3)
Chloride: 102 mmol/L (ref 98–111)
Creatinine, Ser: 0.79 mg/dL (ref 0.44–1.00)
GFR, Estimated: >60 mL/min (ref >60)
Glucose, Bld: 90 mg/dL (ref 70–99)
Potassium: 3.9 mmol/L (ref 3.5–5.1)
Sodium: 138 mmol/L (ref 135–145)
Total Bilirubin: 0.4 mg/dL (ref 0.3–1.2)
Total Protein: 7.3 g/dL (ref 6.5–8.1)

## 2020-03-06 LAB — CBC WITH DIFFERENTIAL/PLATELET
Abs Immature Granulocytes: 0.02 10*3/uL (ref 0.00–0.07)
Basophils Absolute: 0.1 10*3/uL (ref 0.0–0.1)
Basophils Relative: 1 %
Eosinophils Absolute: 0.4 10*3/uL (ref 0.0–0.5)
Eosinophils Relative: 4 %
HCT: 35.2 % — ABNORMAL LOW (ref 36.0–46.0)
Hemoglobin: 11 g/dL — ABNORMAL LOW (ref 12.0–15.0)
Immature Granulocytes: 0 %
Lymphocytes Relative: 44 %
Lymphs Abs: 4.5 10*3/uL — ABNORMAL HIGH (ref 0.7–4.0)
MCH: 25.3 pg — ABNORMAL LOW (ref 26.0–34.0)
MCHC: 31.3 g/dL (ref 30.0–36.0)
MCV: 81.1 fL (ref 80.0–100.0)
Monocytes Absolute: 0.6 10*3/uL (ref 0.1–1.0)
Monocytes Relative: 6 %
Neutro Abs: 4.5 10*3/uL (ref 1.7–7.7)
Neutrophils Relative %: 45 %
Platelets: 332 10*3/uL (ref 150–400)
RBC: 4.34 MIL/uL (ref 3.87–5.11)
RDW: 14.4 % (ref 11.5–15.5)
WBC: 10.1 10*3/uL (ref 4.0–10.5)
nRBC: 0 % (ref 0.0–0.2)

## 2020-03-06 LAB — URINALYSIS, MICROSCOPIC (REFLEX)

## 2020-03-06 LAB — URINALYSIS, ROUTINE W REFLEX MICROSCOPIC
Bilirubin Urine: NEGATIVE
Glucose, UA: NEGATIVE mg/dL
Ketones, ur: NEGATIVE mg/dL
Leukocytes,Ua: NEGATIVE
Nitrite: NEGATIVE
Protein, ur: NEGATIVE mg/dL
Specific Gravity, Urine: 1.025 (ref 1.005–1.030)
pH: 6 (ref 5.0–8.0)

## 2020-03-06 LAB — PREGNANCY, URINE: Preg Test, Ur: NEGATIVE

## 2020-03-06 LAB — LIPASE, BLOOD: Lipase: 23 U/L (ref 11–51)

## 2020-03-06 MED ORDER — LIDOCAINE VISCOUS HCL 2 % MT SOLN
15.0000 mL | Freq: Once | OROMUCOSAL | Status: AC
  Administered 2020-03-06: 15 mL via ORAL
  Filled 2020-03-06: qty 15

## 2020-03-06 MED ORDER — ALUM & MAG HYDROXIDE-SIMETH 200-200-20 MG/5ML PO SUSP
30.0000 mL | Freq: Once | ORAL | Status: AC
  Administered 2020-03-06: 30 mL via ORAL
  Filled 2020-03-06: qty 30

## 2020-03-06 NOTE — ED Notes (Signed)
Assumed care of patient from [place], [place]

## 2020-03-06 NOTE — Discharge Instructions (Signed)
Please follow up with your gastroenterologist regarding your chronic abdominal pain. They will need to do further testing.   Ensure you are taking your Omeprazole as indicated twice daily.   Return to the ED for any worsening symptoms

## 2020-03-06 NOTE — ED Triage Notes (Addendum)
Pt c/o abd pain after vaginal delivery and BTL Nov 2019-no medical treatment-NAD-steady gait

## 2020-03-06 NOTE — ED Notes (Signed)
Pt took partial dose of GI cocktail documented below; she began gagging and was noted to spit saliva in emesis bag. EDP aware.

## 2020-03-06 NOTE — ED Provider Notes (Signed)
[facility] EMERGENCY DEPARTMENT Provider Note   CSN: [id number] Arrival date & time: 03/06/20  1736     History Chief Complaint  Patient presents with  . Abdominal Pain    Laurie Reed is a 34 y.o. female with PMHx GERD who presents to the ED today with complaint of gradual onset, constant, sharp, upper abdominal pain for the past 2 years. Pt reports that she feels like her symptoms have been ongoing since having her tubes tied in 2019. She does mention that she has seen a GI doctor after testing positive for H pylori. She reports taking all of the medications as indicated and had a repeat test which came back negative however she continues to have pain. She cannot tell if there are any exacerbating or alleviating factors. Has not noticed any difference with eating specific foods. Pt has ben taking Ibuprofen and Tylenol without relief. Pt is here because she tried to get in to see her PCP however they could not see her for another month. She denies any fevers, chills, nausea, vomiting, diarrhea, constipation, melena, obstipation, urinary sx, vaginal discharge, pelvic pain, or any other associated symptoms.   Per chart review: GI visit 10/05/2019 for persistent pain Plan:  Urea breath test while off PPI 5 days. Upper endoscopy would be increased risk given the patient's BMI.  Can be pursued if there is still uncertainty about clearance of H. pylori and/or control of symptoms with medicines. I did my best to explain the nature of GERD the need for diet and lifestyle measures. Plan weight loss essential to control GERD symptoms and also decreased chance of multiple long-term health issues. Discontinue Carafate.  The history is provided by the patient and medical records.       Past Medical History:  Diagnosis Date  . Acid reflux 2004  . Anemia   . Anxiety 2008  . Asthma 1993   no previous intubations or hospitalizations   . Chlamydia   . Depression 2008   no  meds, currenly ok  . Environmental allergies   . GERD (gastroesophageal reflux disease)   . Migraines   . Miscarriage   . Ovarian cyst   . Pre-diabetes   . Ulcer of the stomach and intestine 2004  . Urinary tract infection     Patient Active Problem List   Diagnosis Date Noted  . Chronic low back pain 12/26/2019  . Anemia in pregnancy 01/23/2018  . Morbid obesity with BMI of 45.0-49.9, adult (HCC) 10/19/2017  . Seasonal allergic rhinitis 11/01/2016  . Asthma 06/30/2015  . Vitamin D deficiency 01/22/2015  . Anxiety and depression 01/21/2015    Past Surgical History:  Procedure Laterality Date  . COLONOSCOPY  2004   . IRRIGATION AND DEBRIDEMENT SEBACEOUS CYST N/A 06/01/2017   Procedure: EXCISION SEBACEOUS CYST ON SCALP;  Surgeon: Ancil Linseyavis, Jason Evan, MD;  Location: [facility] ORS;  Service: General;  Laterality: N/A;  . MULTIPLE TOOTH EXTRACTIONS  2015   6 teeth removed   . TUBAL LIGATION N/A 04/04/2018   Procedure: POST PARTUM TUBAL LIGATION;  Surgeon: Levie HeritageStinson, Jacob J, DO;  Location: [facility];  Service: Gynecology;  Laterality: N/A;  . WISDOM TOOTH EXTRACTION  2007     OB History    Gravida  7   Para  4   Term  4   Preterm  0   AB  3   Living  4     SAB  3   TAB  0  Ectopic  0   Multiple  0   Live Births  4           Family History  Problem Relation Age of Onset  . Asthma Mother   . Heart disease Mother   . Hypertension Mother   . Clotting disorder Mother   . Diabetes Mother   . Asthma Maternal Aunt   . Hypertension Maternal Aunt   . Diabetes Maternal Aunt   . Asthma Maternal Uncle   . Hypertension Maternal Uncle   . Diabetes Maternal Uncle   . Heart disease Maternal Grandmother   . Diabetes Maternal Grandmother   . Hypertension Maternal Grandmother   . Heart disease Maternal Grandfather   . Diabetes Maternal Grandfather   . Hypertension Maternal Grandfather   . Hypertension Paternal Grandmother   . Diabetes Maternal Uncle   .  Heart disease Maternal Uncle   . Diabetes Maternal Uncle   . Cancer Paternal Aunt   . Hypotension Neg Hx   . Anesthesia problems Neg Hx   . Malignant hyperthermia Neg Hx   . Pseudochol deficiency Neg Hx     Social History   Tobacco Use  . Smoking status: Never Smoker  . Smokeless tobacco: Never Used  Vaping Use  . Vaping Use: Never used  Substance Use Topics  . Alcohol use: No    Alcohol/week: 0.0 standard drinks  . Drug use: No    Home Medications Prior to Admission medications   Medication Sig Start Date End Date Taking? Authorizing Provider  acetaminophen (TYLENOL) 500 MG tablet Take 500-1,000 mg by mouth every 6 (six) hours as needed for mild pain, moderate pain or headache (or migraines).     [provider]  albuterol (PROVENTIL) (2.5 MG/3ML) 0.083% nebulizer solution Take 3 mLs (2.5 mg total) by nebulization every 6 (six) hours as needed for wheezing or shortness of breath. 10/14/19   Moshe Cipro, NP  albuterol (VENTOLIN HFA) 108 (90 Base) MCG/ACT inhaler Inhale 1-2 puffs into the lungs every 6 (six) hours as needed for wheezing or shortness of breath.    [provider]  cetirizine (ZYRTEC) 10 MG tablet Take 1 tablet (10 mg total) by mouth daily. Prn itching 01/02/20   Anders Simmonds, PA-C  diphenhydrAMINE (BENADRYL) 25 mg capsule Take 25-50 mg by mouth 2 (two) times daily as needed for allergies.    [provider]  DULoxetine (CYMBALTA) 30 MG capsule Take 1 capsule (30 mg total) by mouth daily. 12/14/19 01/13/20  Claiborne Rigg, NP  escitalopram (LEXAPRO) 10 MG tablet Take 1 tablet (10 mg total) by mouth daily. Patient not taking: Reported on 10/05/2019 06/27/19   Cain Saupe, MD  ferrous sulfate 324 (65 Fe) MG TBEC 1 pill daily after meal to help with anemia 11/22/18   [name], [name], MD  fexofenadine (ALLEGRA) 180 MG tablet Take 180 mg by mouth daily as needed for allergies or rhinitis.     [provider]  fluticasone (FLONASE)  50 MCG/ACT nasal spray Place 1 spray into both nostrils daily.    [provider]  hydrOXYzine (ATARAX/VISTARIL) 10 MG tablet Take 1 tablet (10 mg total) by mouth every 6 (six) hours as needed for itching. 12/16/19   Claiborne Rigg, NP  meloxicam (MOBIC) 15 MG tablet Take 1 tablet (15 mg total) by mouth daily. 01/02/20   Anders Simmonds, PA-C  montelukast (SINGULAIR) 10 MG tablet Take 1 tablet (10 mg total) by mouth at bedtime. 10/14/19  Moshe Cipro, NP  omeprazole (PRILOSEC) 40 MG capsule Take 1 capsule (40 mg total) by mouth 2 (two) times daily. To reduce stomach acid 06/13/19   [name], [name], MD  SUMAtriptan (IMITREX) 50 MG tablet Take 1 tablet (50 mg total) by mouth once as needed (at the onset of a migraine- may repeat once in 2 hours if headache persists or recurs). 11/22/18   Hoy Register, MD  topiramate (TOPAMAX) 50 MG tablet Take 1 tablet (50 mg total) by mouth 2 (two) times daily. 11/22/18   Hoy Register, MD  dicyclomine (BENTYL) 10 MG capsule Take 1 capsule (10 mg total) by mouth 4 (four) times daily -  before meals and at bedtime. For stomach cramping Patient not taking: Reported on 10/25/2019 06/13/19 10/25/19  [name], Hewitt Shorts, MD  sucralfate (CARAFATE) 1 GM/10ML suspension Take 10 mLs (1 g total) by mouth 4 (four) times daily -  with meals and at bedtime. Patient not taking: Reported on 10/25/2019 06/13/19 10/25/19  Cain Saupe, MD    Allergies    Latex, Other, Ondansetron, Tree extract, and Zofran  Review of Systems   Review of Systems  Constitutional: Negative for chills and fever.  Gastrointestinal: Positive for abdominal pain. Negative for constipation, diarrhea, nausea and vomiting.  Genitourinary: Negative for difficulty urinating, flank pain, menstrual problem, pelvic pain, vaginal bleeding and vaginal discharge.  All other systems reviewed and are negative.   Physical Exam Updated Vital Signs BP 117/72 (BP Location: Left Arm)   Pulse 90   Temp 98.8 F (37.1  C) (Oral)   Resp 18   Ht 5\' 2"  (1.575 m)   Wt 122.5 kg   LMP 02/25/2020   SpO2 100%   BMI 49.38 kg/m   Physical Exam Vitals and nursing note reviewed.  Constitutional:      Appearance: She is not ill-appearing.  HENT:     Head: Normocephalic and atraumatic.  Eyes:     Conjunctiva/sclera: Conjunctivae normal.  Cardiovascular:     Rate and Rhythm: Normal rate and regular rhythm.  Pulmonary:     Effort: Pulmonary effort is normal.     Breath sounds: Normal breath sounds.  Abdominal:     Palpations: Abdomen is soft.     Tenderness: There is abdominal tenderness in the epigastric area. There is no guarding or rebound.  Musculoskeletal:     Cervical back: Neck supple.  Skin:    General: Skin is warm and dry.  Neurological:     Mental Status: She is alert.     ED Results / Procedures / Treatments   Labs (all labs ordered are listed, but only abnormal results are displayed) Labs Reviewed  COMPREHENSIVE METABOLIC PANEL - Abnormal; Notable for the following components:      Result Value   AST 12 (*)    All other components within normal limits  CBC WITH DIFFERENTIAL/PLATELET - Abnormal; Notable for the following components:   Hemoglobin 11.0 (*)    HCT 35.2 (*)    MCH 25.3 (*)    Lymphs Abs 4.5 (*)    All other components within normal limits  URINALYSIS, ROUTINE W REFLEX MICROSCOPIC - Abnormal; Notable for the following components:   APPearance CLOUDY (*)    Hgb urine dipstick SMALL (*)    All other components within normal limits  URINALYSIS, MICROSCOPIC (REFLEX) - Abnormal; Notable for the following components:   Bacteria, UA MANY (*)    All other components within normal limits  LIPASE, BLOOD  PREGNANCY, URINE  EKG None  Radiology No results found.  Procedures Procedures (including critical care time)  Medications Ordered in ED Medications  alum & mag hydroxide-simeth (MAALOX/MYLANTA) 200-200-20 MG/5ML suspension 30 mL (30 mLs Oral Given 03/06/20  2058)    And  lidocaine (XYLOCAINE) 2 % viscous mouth solution 15 mL (15 mLs Oral Given 03/06/20 2058)    ED Course  I have reviewed the triage vital signs and the nursing notes.  Pertinent labs & imaging results that were available during my care of the patient were reviewed by me and considered in my medical decision making (see chart for details).    MDM Rules/Calculators/A&P                          34 year old female presenting to the ED today with complaint of persistent upper abdominal pain x 2 years. Seen by GI and diagnosed with H pylori however has been eradicated and continues to have pain. Has not followed back up. No worsening symptoms however pt reports she is tired of being in pain and tried to see PCP who could not see her for another month. On arrival VSS. Pt is in no acute distress. She has some mild epigastric abdominal TTP. I had lengthy discussion with her regarding the fact that she will need to follow up with GI for further eval. It does appear they were considering endoscopy next however pt high risk given elevated BMI. Will plan for labwork at this time to assess for any abnormalities however I suspect this is pt's typical chronic abdominal pain that needs further assessment by specialist.   Labwork unremarkable at this time. Hgb stable at 11.0  Some RBCs in urine however pt just had her menstrual cycle. No lower abdominal/pelvic complaints today.   Pt instructed to follow up with GI as no acute findings today. Do not feel she needs imaging for her chronic pain today. Pt does not seem to know if she is taking her omeprazole as indicated; have instructed she check at home if she is taking this once or twice daily as it was increased by GI and may be cause of symptoms as well. Pt is in agreement with plan and stable for discharge.   This note was prepared using Dragon voice recognition software and may include unintentional dictation errors due to the inherent limitations  of voice recognition software.  Final Clinical Impression(s) / ED Diagnoses Final diagnoses:  Chronic abdominal pain    Rx / DC Orders ED Discharge Orders    None       Discharge Instructions     Please follow up with your gastroenterologist regarding your chronic abdominal pain. They will need to do further testing.   Ensure you are taking your Omeprazole as indicated twice daily.   Return to the ED for any worsening symptoms       Tanda Rockers, Cordelia Poche 03/06/20 2126    Gwyneth Sprout, MD 03/06/20 2240

## 2020-03-12 ENCOUNTER — Other Ambulatory Visit (HOSPITAL_COMMUNITY): Payer: Self-pay | Admitting: Internal Medicine

## 2020-03-21 ENCOUNTER — Encounter: Payer: Self-pay | Admitting: Physician Assistant

## 2020-03-21 ENCOUNTER — Ambulatory Visit: Payer: Medicaid Other | Admitting: Physician Assistant

## 2020-03-21 ENCOUNTER — Other Ambulatory Visit: Payer: Self-pay | Admitting: Physician Assistant

## 2020-03-21 VITALS — BP 112/80 | HR 83 | Ht 62.0 in | Wt 266.0 lb

## 2020-03-21 DIAGNOSIS — R1084 Generalized abdominal pain: Secondary | ICD-10-CM | POA: Diagnosis not present

## 2020-03-21 DIAGNOSIS — R1033 Periumbilical pain: Secondary | ICD-10-CM | POA: Diagnosis not present

## 2020-03-21 DIAGNOSIS — R072 Precordial pain: Secondary | ICD-10-CM

## 2020-03-21 DIAGNOSIS — K219 Gastro-esophageal reflux disease without esophagitis: Secondary | ICD-10-CM | POA: Diagnosis not present

## 2020-03-21 DIAGNOSIS — R1013 Epigastric pain: Secondary | ICD-10-CM

## 2020-03-21 MED ORDER — OMEPRAZOLE 40 MG PO CPDR: 40.0000 mg | DELAYED_RELEASE_CAPSULE | Freq: Every day | ORAL | 11 refills | Status: DC

## 2020-03-21 NOTE — Patient Instructions (Signed)
If you are age 33 or older, your body mass index should be between 23-30. Your Body mass index is 48.65 kg/m. If this is out of the aforementioned range listed, please consider follow up with your Primary Care Provider.  If you are age 64 or younger, your body mass index should be between 19-25. Your Body mass index is 48.65 kg/m. If this is out of the aformentioned range listed, please consider follow up with your Primary Care Provider.   You have been scheduled for a CT scan of the abdomen and pelvis at [facility], 1st floor Radiology. You are scheduled on 04/02/20  at 9:30 am. You should arrive 15 minutes prior to your appointment time for registration.  Please pick up 2 bottles of contrast from [place] at least 3 days prior to your scan. The solution may taste better if refrigerated, but do NOT add ice or any other liquid to this solution. Shake well before drinking.   Please follow the written instructions below on the day of your exam:   1) Do not eat anything after 5:30 am (4 hours prior to your test)   2) Drink 1 bottle of contrast @ 7:30 am (2 hours prior to your exam)  Remember to shake well before drinking and do NOT pour over ice.     Drink 1 bottle of contrast @ 8:30 am (1 hour prior to your exam)   You may take any medications as prescribed with a small amount of water, if necessary. If you take any of the following medications: METFORMIN, GLUCOPHAGE, GLUCOVANCE, AVANDAMET, RIOMET, FORTAMET, ACTOPLUS MET, JANUMET, GLUMETZA or METAGLIP, you MAY be asked to HOLD this medication 48 hours AFTER the exam.   The purpose of you drinking the oral contrast is to aid in the visualization of your intestinal tract. The contrast solution may cause some diarrhea. Depending on your individual set of symptoms, you may also receive an intravenous injection of x-ray contrast/dye. Plan on being at [place] for 45 minutes or longer, depending on the type of exam you are having performed.    If you have any questions regarding your exam or if you need to reschedule, you may call  Radiology at [phone] between the hours of 8:00 am and 5:00 pm, Monday-Friday.   Restart Omeprazole 40 mg 1 capsule every morning before breakfast.   STOP using Meloxicam  Follow up pending the results of your CT  

## 2020-03-21 NOTE — Progress Notes (Signed)
Subjective:    Patient ID: Laurie Reed, female    DOB: 1985-07-02, 34 y.o.   MRN: [id number]  HPI Laurie Reed is a pleasant 34 year old African-American female, known to Dr. Noah Charon having been seen once in the office in May 2021.  At that time she was seen for epigastric pain.  She had tested positive for H. pylori and had completed a course of antibiotics, when seen here urea breath testing was done to prove eradication and this was negative.  She had been on omeprazole prior to that time for GERD but did not resume it after she underwent breath testing as she did not think she was supposed to. Other medical issues include anxiety/depression, obesity, prior bilateral tubal ligation in 2019 and asthma. Patient comes in now after recent ER visit on 03/06/2020 for abdominal pain.  She says this pain is different than the pain she experienced with the H. pylori infection and this is located in the mid abdomen around the area of her umbilicus.  Pain has been present now over the past month.  She says is actually been a little bit better over this past week and has been more intermittent.  She describes it as crampy and sharp in nature, not affected by p.o. intake necessarily, no associated nausea or vomiting, no fever or chills.  Bowel movements have been normal for her, no melena or hematochezia.  She has also had an increase in reflux symptoms over the past few months sometimes bothering her after lying down at night particularly.  No dysphagia. She did not have any abdominal imaging done at the time of the ER visit, labs are reviewed and show WBC of 10.1, hemoglobin 11 hematocrit of 35.2 LFTs within normal limits, pregnancy test negative, UA was positive for WBCs and bacteria felt not clean-catch. She had been taking Mobic/meloxicam for arthritic symptoms in her back but says she has really not been using this over the past few weeks and was not sure that it made that much difference and seem to cause  some drowsiness.  No other aspirin or NSAID use.  No other abdominal surgery other than bilateral tubal ligation.  Review of Systems Pertinent positive and negative review of systems were noted in the above HPI section.  All other review of systems was otherwise negative.  Outpatient Encounter Medications as of 03/21/2020  Medication Sig   acetaminophen (TYLENOL) 500 MG tablet Take 500-1,000 mg by mouth every 6 (six) hours as needed for mild pain, moderate pain or headache (or migraines).    albuterol (PROVENTIL) (2.5 MG/3ML) 0.083% nebulizer solution Take 3 mLs (2.5 mg total) by nebulization every 6 (six) hours as needed for wheezing or shortness of breath.   albuterol (VENTOLIN HFA) 108 (90 Base) MCG/ACT inhaler Inhale 1-2 puffs into the lungs every 6 (six) hours as needed for wheezing or shortness of breath.   cetirizine (ZYRTEC) 10 MG tablet Take 1 tablet (10 mg total) by mouth daily. Prn itching   diphenhydrAMINE (BENADRYL) 25 mg capsule Take 25-50 mg by mouth 2 (two) times daily as needed for allergies.   ferrous sulfate 324 (65 Fe) MG TBEC 1 pill daily after meal to help with anemia   fexofenadine (ALLEGRA) 180 MG tablet Take 180 mg by mouth daily as needed for allergies or rhinitis.    fluticasone (FLONASE) 50 MCG/ACT nasal spray Place 1 spray into both nostrils daily.   hydrOXYzine (ATARAX/VISTARIL) 10 MG tablet Take 1 tablet (10 mg total) by mouth every 6 (  six) hours as needed for itching.   meloxicam (MOBIC) 15 MG tablet Take 1 tablet (15 mg total) by mouth daily.   montelukast (SINGULAIR) 10 MG tablet Take 1 tablet (10 mg total) by mouth at bedtime.   SUMAtriptan (IMITREX) 50 MG tablet Take 1 tablet (50 mg total) by mouth once as needed (at the onset of a migraine- may repeat once in 2 hours if headache persists or recurs).   topiramate (TOPAMAX) 50 MG tablet Take 1 tablet (50 mg total) by mouth 2 (two) times daily.   DULoxetine (CYMBALTA) 30 MG capsule Take 1 capsule (30  mg total) by mouth daily.   escitalopram (LEXAPRO) 10 MG tablet Take 1 tablet (10 mg total) by mouth daily. (Patient not taking: Reported on 10/05/2019)   omeprazole (PRILOSEC) 40 MG capsule Take 1 capsule (40 mg total) by mouth daily. To reduce stomach acid   phentermine (ADIPEX-P) 37.5 MG tablet Take 37.5 mg by mouth at bedtime.   [DISCONTINUED] dicyclomine (BENTYL) 10 MG capsule Take 1 capsule (10 mg total) by mouth 4 (four) times daily -  before meals and at bedtime. For stomach cramping (Patient not taking: Reported on 10/25/2019)   [DISCONTINUED] omeprazole (PRILOSEC) 40 MG capsule Take 1 capsule (40 mg total) by mouth 2 (two) times daily. To reduce stomach acid (Patient not taking: Reported on 03/21/2020)   [DISCONTINUED] sucralfate (CARAFATE) 1 GM/10ML suspension Take 10 mLs (1 g total) by mouth 4 (four) times daily -  with meals and at bedtime. (Patient not taking: Reported on 10/25/2019)   No facility-administered encounter medications on file as of 03/21/2020.   Allergies  Allergen Reactions   Latex Hives and Itching   Other Itching    "20 different types of trees"   Ondansetron Itching   Tree Extract Itching   Zofran Itching   Patient Active Problem List   Diagnosis Date Noted   Chronic low back pain 12/26/2019   Anemia in pregnancy 01/23/2018   Morbid obesity with BMI of 45.0-49.9, adult (HCC) 10/19/2017   Seasonal allergic rhinitis 11/01/2016   Asthma 06/30/2015   Vitamin D deficiency 01/22/2015   Anxiety and depression 01/21/2015   Social History   Socioeconomic History   Marital status: Divorced    Spouse name: Not on file   Number of children: 4   Years of education: 12    Highest education level: Not on file  Occupational History    Employer: [employer]  Tobacco Use   Smoking status: Never Smoker   Smokeless tobacco: Never Used  Building services engineer Use: Never used  Substance and Sexual Activity   Alcohol use: No     Alcohol/week: 0.0 standard drinks   Drug use: No   Sexual activity: Not on file  Other Topics Concern   Not on file  Social History Narrative   Lives with 3 children.   Immediate family in [place].    Born in [place].   Raised in [place], [place].    Lives in a 2 story home.   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)   Education: high school   Social Determinants of Health   Financial Resource Strain:    Difficulty of Paying Living Expenses: Not on file  Food Insecurity:    Worried About Programme researcher, broadcasting/film/video in the Last Year: Not on file   [employer] of Food in the Last Year: Not on file  Transportation Needs:    Lack of Transportation (Medical):  Not on file   Lack of Transportation (Non-Medical): Not on file  Physical Activity:    Days of Exercise per Week: Not on file   Minutes of Exercise per Session: Not on file  Stress:    Feeling of Stress : Not on file  Social Connections:    Frequency of Communication with Friends and Family: Not on file   Frequency of Social Gatherings with Friends and Family: Not on file   Attends Religious Services: Not on file   Active Member of Clubs or Organizations: Not on file   Attends Banker Meetings: Not on file   Marital Status: Not on file  Intimate Partner Violence:    Fear of Current or Ex-Partner: Not on file   Emotionally Abused: Not on file   Physically Abused: Not on file   Sexually Abused: Not on file    Ms. Montgomery's family history includes Asthma in her maternal aunt, maternal uncle, and mother; Cancer in her paternal aunt; Clotting disorder in her mother; Diabetes in her maternal aunt, maternal grandfather, maternal grandmother, maternal uncle, maternal uncle, maternal uncle, and mother; Heart disease in her maternal grandfather, maternal grandmother, maternal uncle, and mother; Hypertension in her maternal aunt, maternal grandfather, maternal grandmother, maternal uncle, mother, and paternal  grandmother.      Objective:    Vitals:   03/21/20 1521  BP: 112/80  Pulse: 83  SpO2: 98%    Physical Exam Well-developed well-nourished African-American female in no acute distress.  Height, Weight, 266 BMI 48.6  HEENT; nontraumatic normocephalic, EOMI, PER R LA, sclera anicteric. Oropharynx; not examined Neck; supple, no JVD Cardiovascular; regular rate and rhythm with S1-S2, no murmur rub or gallop Pulmonary; Clear bilaterally Abdomen; soft, obese ,, nondistended, she is tender in the periumbilical area and in the left midabdomen greater than right midabdomen, no palpable mass, no appreciable abdominal wall hernia, prior laparoscopy incisional scar no palpable mass or hepatosplenomegaly, bowel sounds are active Rectal; not done today Skin; benign exam, no jaundice rash or appreciable lesions Extremities; no clubbing cyanosis or edema skin warm and dry Neuro/Psych; alert and oriented x4, grossly nonfocal mood and affect appropriate       Assessment & Plan:   # 41  34 year old African-American female with history of chronic GERD, off medication over the past few months with increase in symptoms.  #2 history of H. pylori infection, treated and eradicated with negative urea breath test May 2021 #3  1 month history of periumbilical abdominal pain which has been fairly constant and described as crampy and sharp. Etiology is not clear rule out intra-abdominal inflammatory process, consider IBS, rule out ventral hernia  #4 morbid obesity 5.  Asthma 6.  Anxiety/depression  Plan; restart omeprazole 40 mg p.o. every morning, refill x1 year We discussed an antireflux regimen. Stop meloxicam Patient will be scheduled for CT scan of the abdomen and pelvis with contrast. Further recommendations pending results of CT and response to PPI.   Hayato Guaman S Axil Copeman PA-C 03/21/2020   Cc: Fleet Contras, MD

## 2020-03-24 NOTE — Progress Notes (Signed)
____________________________________________________________  Attending physician addendum:  Thank you for sending this case to me. I have reviewed the entire note, and the outlined plan seems appropriate.  Gladiola Madore Danis, MD  ____________________________________________________________  

## 2020-04-02 ENCOUNTER — Ambulatory Visit (HOSPITAL_COMMUNITY): Payer: Medicaid Other

## 2020-04-14 ENCOUNTER — Other Ambulatory Visit: Payer: Self-pay

## 2020-04-14 DIAGNOSIS — R1084 Generalized abdominal pain: Secondary | ICD-10-CM

## 2020-04-14 DIAGNOSIS — R1013 Epigastric pain: Secondary | ICD-10-CM

## 2020-04-14 DIAGNOSIS — R1033 Periumbilical pain: Secondary | ICD-10-CM

## 2020-04-16 ENCOUNTER — Ambulatory Visit (HOSPITAL_COMMUNITY): Payer: Medicaid Other

## 2020-04-23 ENCOUNTER — Other Ambulatory Visit (HOSPITAL_COMMUNITY): Payer: Self-pay | Admitting: Internal Medicine

## 2020-04-28 ENCOUNTER — Ambulatory Visit (HOSPITAL_COMMUNITY)
Admission: RE | Admit: 2020-04-28 | Discharge: 2020-04-28 | Disposition: A | Discharge status: Home / Self Care | Payer: Medicaid Other | Source: Ambulatory Visit | Attending: Physician Assistant | Admitting: Physician Assistant

## 2020-04-28 ENCOUNTER — Other Ambulatory Visit: Payer: Self-pay

## 2020-04-28 DIAGNOSIS — R1033 Periumbilical pain: Secondary | ICD-10-CM | POA: Insufficient documentation

## 2020-04-28 DIAGNOSIS — R1084 Generalized abdominal pain: Secondary | ICD-10-CM | POA: Insufficient documentation

## 2020-04-28 DIAGNOSIS — R1013 Epigastric pain: Secondary | ICD-10-CM | POA: Diagnosis present

## 2020-05-01 ENCOUNTER — Other Ambulatory Visit: Payer: Self-pay

## 2020-05-01 DIAGNOSIS — R1084 Generalized abdominal pain: Secondary | ICD-10-CM

## 2020-05-01 DIAGNOSIS — R1033 Periumbilical pain: Secondary | ICD-10-CM

## 2020-05-06 ENCOUNTER — Ambulatory Visit (HOSPITAL_COMMUNITY): Payer: Medicaid Other

## 2020-05-07 ENCOUNTER — Other Ambulatory Visit: Payer: Self-pay

## 2020-05-07 DIAGNOSIS — R1033 Periumbilical pain: Secondary | ICD-10-CM

## 2020-05-07 DIAGNOSIS — R1084 Generalized abdominal pain: Secondary | ICD-10-CM

## 2020-05-12 ENCOUNTER — Ambulatory Visit (INDEPENDENT_AMBULATORY_CARE_PROVIDER_SITE_OTHER): Payer: Medicaid Other

## 2020-05-12 ENCOUNTER — Ambulatory Visit (HOSPITAL_COMMUNITY)
Admission: EM | Admit: 2020-05-12 | Discharge: 2020-05-12 | Disposition: A | Discharge status: Home / Self Care | Payer: Medicaid Other | Attending: Physician Assistant | Admitting: Physician Assistant

## 2020-05-12 ENCOUNTER — Encounter (HOSPITAL_COMMUNITY): Payer: Self-pay

## 2020-05-12 ENCOUNTER — Other Ambulatory Visit: Payer: Self-pay

## 2020-05-12 ENCOUNTER — Other Ambulatory Visit (HOSPITAL_COMMUNITY): Payer: Self-pay | Admitting: Physician Assistant

## 2020-05-12 DIAGNOSIS — Z20822 Contact with and (suspected) exposure to covid-19: Secondary | ICD-10-CM | POA: Diagnosis not present

## 2020-05-12 DIAGNOSIS — R059 Cough, unspecified: Secondary | ICD-10-CM | POA: Insufficient documentation

## 2020-05-12 DIAGNOSIS — R0602 Shortness of breath: Secondary | ICD-10-CM | POA: Insufficient documentation

## 2020-05-12 DIAGNOSIS — R0600 Dyspnea, unspecified: Secondary | ICD-10-CM | POA: Diagnosis not present

## 2020-05-12 DIAGNOSIS — J189 Pneumonia, unspecified organism: Secondary | ICD-10-CM | POA: Diagnosis present

## 2020-05-12 LAB — RESP PANEL BY RT-PCR (FLU A&B, COVID) ARPGX2
Influenza A by PCR: NEGATIVE
Influenza B by PCR: NEGATIVE
SARS Coronavirus 2 by RT PCR: POSITIVE — AB

## 2020-05-12 MED ORDER — AMOXICILLIN 500 MG PO CAPS: 1000.0000 mg | ORAL_CAPSULE | Freq: Three times a day (TID) | ORAL | 0 refills | Status: DC

## 2020-05-12 MED ORDER — AZITHROMYCIN 250 MG PO TABS: 250.0000 mg | ORAL_TABLET | Freq: Every day | ORAL | 0 refills | Status: DC

## 2020-05-12 MED ORDER — ALBUTEROL SULFATE HFA 108 (90 BASE) MCG/ACT IN AERS: 2.0000 | INHALATION_SPRAY | RESPIRATORY_TRACT | 0 refills | Status: DC | PRN

## 2020-05-12 NOTE — Discharge Instructions (Addendum)
Go to ER with worsening symptoms Follow up with PCP this week to ensure resolution of symptoms. Take medication as prescribed.

## 2020-05-12 NOTE — ED Triage Notes (Signed)
Pt In with c/o body aches, headaches, congestion, sob,and cough that has been going on for 3 days now.  Pt has been taking theraflu and tylenol with minimal relief  Denies any vomiting or diarrhea

## 2020-05-12 NOTE — ED Provider Notes (Signed)
[facility]    CSN: [id number] Arrival date & time: 05/12/20  6712      History   Chief Complaint Chief Complaint  Patient presents with  . Generalized Body Aches  . Cough  . Shortness of Breath  . Headache    HPI Laurie Reed is a 34 y.o. female.   Patient here c/w cough x 3 days.  Admits chills, nasal congestion, rhinorrhea, sore throat, laryngitis, cough, SOB.  Cough is occasionally productive.  Denies otalgia, n/v/d, abdominal pain, wheezing.  She has asthma, has been using inhaler w/o relief.  She has tried tylenol, dayquil, and Theraflu w/o relief.  No known exposures to COVID, she has had COVID vaccine.     Past Medical History:  Diagnosis Date  . Acid reflux 2004  . Anemia   . Anxiety 2008  . Asthma 1993   no previous intubations or hospitalizations   . Chlamydia   . Depression 2008   no meds, currenly ok  . Environmental allergies   . GERD (gastroesophageal reflux disease)   . Migraines   . Miscarriage   . Ovarian cyst   . Pre-diabetes   . Ulcer of the stomach and intestine 2004  . Urinary tract infection     Patient Active Problem List   Diagnosis Date Noted  . Chronic low back pain 12/26/2019  . Anemia in pregnancy 01/23/2018  . Morbid obesity with BMI of 45.0-49.9, adult (HCC) 10/19/2017  . Seasonal allergic rhinitis 11/01/2016  . Asthma 06/30/2015  . Vitamin D deficiency 01/22/2015  . Anxiety and depression 01/21/2015    Past Surgical History:  Procedure Laterality Date  . COLONOSCOPY  2004   . IRRIGATION AND DEBRIDEMENT SEBACEOUS CYST N/A 06/01/2017   Procedure: EXCISION SEBACEOUS CYST ON SCALP;  Surgeon: Ancil Linsey, MD;  Location: [facility];  Service: General;  Laterality: N/A;  . MULTIPLE TOOTH EXTRACTIONS  2015   6 teeth removed   . TUBAL LIGATION N/A 04/04/2018   Procedure: POST PARTUM TUBAL LIGATION;  Surgeon: Levie Heritage, DO;  Location: [facility];  Service: Gynecology;  Laterality: N/A;  .  WISDOM TOOTH EXTRACTION  2007    OB History    Gravida  7   Para  4   Term  4   Preterm  0   AB  3   Living  4     SAB  3   IAB  0   Ectopic  0   Multiple  0   Live Births  4            Home Medications    Prior to Admission medications   Medication Sig Start Date End Date Taking? Authorizing Provider  albuterol (VENTOLIN HFA) 108 (90 Base) MCG/ACT inhaler Inhale 2 puffs into the lungs every 4 (four) hours as needed for wheezing or shortness of breath. 05/12/20  Yes Evern Core, PA-C  amoxicillin (AMOXIL) 500 MG capsule Take 2 capsules (1,000 mg total) by mouth 3 (three) times daily for 7 days. 05/12/20 05/19/20 Yes Evern Core, PA-C  azithromycin (ZITHROMAX) 250 MG tablet Take 1 tablet (250 mg total) by mouth daily. Take first 2 tablets together, then 1 every day until finished. 05/12/20  Yes Evern Core, PA-C  acetaminophen (TYLENOL) 500 MG tablet Take 500-1,000 mg by mouth every 6 (six) hours as needed for mild pain, moderate pain or headache (or migraines).     [provider]  albuterol (PROVENTIL) (2.5 MG/3ML) 0.083% nebulizer solution  Take 3 mLs (2.5 mg total) by nebulization every 6 (six) hours as needed for wheezing or shortness of breath. 10/14/19   Moshe Cipro, NP  albuterol (VENTOLIN HFA) 108 (90 Base) MCG/ACT inhaler Inhale 1-2 puffs into the lungs every 6 (six) hours as needed for wheezing or shortness of breath.    [provider]  cetirizine (ZYRTEC) 10 MG tablet Take 1 tablet (10 mg total) by mouth daily. Prn itching 01/02/20   Anders Simmonds, PA-C  diphenhydrAMINE (BENADRYL) 25 mg capsule Take 25-50 mg by mouth 2 (two) times daily as needed for allergies.    [provider]  DULoxetine (CYMBALTA) 30 MG capsule Take 1 capsule (30 mg total) by mouth daily. 12/14/19 01/13/20  Claiborne Rigg, NP  escitalopram (LEXAPRO) 10 MG tablet Take 1 tablet (10 mg total) by mouth daily. Patient not taking: Reported on  10/05/2019 06/27/19   Cain Saupe, MD  ferrous sulfate 324 (65 Fe) MG TBEC 1 pill daily after meal to help with anemia 11/22/18   [name], [name], MD  fexofenadine (ALLEGRA) 180 MG tablet Take 180 mg by mouth daily as needed for allergies or rhinitis.     [provider]  fluticasone (FLONASE) 50 MCG/ACT nasal spray Place 1 spray into both nostrils daily.    [provider]  hydrOXYzine (ATARAX/VISTARIL) 10 MG tablet Take 1 tablet (10 mg total) by mouth every 6 (six) hours as needed for itching. 12/16/19   Claiborne Rigg, NP  meloxicam (MOBIC) 15 MG tablet Take 1 tablet (15 mg total) by mouth daily. 01/02/20   Anders Simmonds, PA-C  montelukast (SINGULAIR) 10 MG tablet Take 1 tablet (10 mg total) by mouth at bedtime. 10/14/19   Moshe Cipro, NP  omeprazole (PRILOSEC) 40 MG capsule Take 1 capsule (40 mg total) by mouth daily. To reduce stomach acid 03/21/20   Esterwood, Amy S, PA-C  phentermine (ADIPEX-P) 37.5 MG tablet Take 37.5 mg by mouth at bedtime. 03/13/20   [provider]  SUMAtriptan (IMITREX) 50 MG tablet Take 1 tablet (50 mg total) by mouth once as needed (at the onset of a migraine- may repeat once in 2 hours if headache persists or recurs). 11/22/18   Hoy Register, MD  topiramate (TOPAMAX) 50 MG tablet Take 1 tablet (50 mg total) by mouth 2 (two) times daily. 11/22/18   Hoy Register, MD  dicyclomine (BENTYL) 10 MG capsule Take 1 capsule (10 mg total) by mouth 4 (four) times daily -  before meals and at bedtime. For stomach cramping Patient not taking: Reported on 10/25/2019 06/13/19 10/25/19  [name], Hewitt Shorts, MD  sucralfate (CARAFATE) 1 GM/10ML suspension Take 10 mLs (1 g total) by mouth 4 (four) times daily -  with meals and at bedtime. Patient not taking: Reported on 10/25/2019 06/13/19 10/25/19  Cain Saupe, MD    Family History Family History  Problem Relation Age of Onset  . Asthma Mother   . Heart disease Mother   . Hypertension Mother   . Clotting  disorder Mother   . Diabetes Mother   . Asthma Maternal Aunt   . Hypertension Maternal Aunt   . Diabetes Maternal Aunt   . Asthma Maternal Uncle   . Hypertension Maternal Uncle   . Diabetes Maternal Uncle   . Heart disease Maternal Grandmother   . Diabetes Maternal Grandmother   . Hypertension Maternal Grandmother   . Heart disease Maternal Grandfather   . Diabetes Maternal Grandfather   . Hypertension Maternal Grandfather   .  Hypertension Paternal Grandmother   . Diabetes Maternal Uncle   . Heart disease Maternal Uncle   . Diabetes Maternal Uncle   . Cancer Paternal Aunt   . Hypotension Neg Hx   . Anesthesia problems Neg Hx   . Malignant hyperthermia Neg Hx   . Pseudochol deficiency Neg Hx     Social History Social History   Tobacco Use  . Smoking status: Never Smoker  . Smokeless tobacco: Never Used  Vaping Use  . Vaping Use: Never used  Substance Use Topics  . Alcohol use: No    Alcohol/week: 0.0 standard drinks  . Drug use: No     Allergies   Latex, Other, Ondansetron, Tree extract, and Zofran   Review of Systems Review of Systems  Constitutional: Positive for chills and fatigue. Negative for fever.  HENT: Positive for congestion, postnasal drip, rhinorrhea and sore throat. Negative for ear pain, nosebleeds, sinus pressure and sinus pain.   Eyes: Negative for pain and redness.  Respiratory: Positive for cough and shortness of breath. Negative for wheezing.   Gastrointestinal: Negative for abdominal pain, diarrhea, nausea and vomiting.  Musculoskeletal: Positive for arthralgias and myalgias.  Skin: Negative for rash.  Neurological: Negative for light-headedness and headaches.  Hematological: Negative for adenopathy. Does not bruise/bleed easily.  Psychiatric/Behavioral: Negative for confusion and sleep disturbance.     Physical Exam Triage Vital Signs ED Triage Vitals  Enc Vitals Group     BP 05/12/20 1139 (!) 131/93     Pulse Rate 05/12/20 1139 90      Resp 05/12/20 1139 20     Temp 05/12/20 1139 98.2 F (36.8 C)     Temp Source 05/12/20 1139 Oral     SpO2 05/12/20 1139 98 %     Weight --      Height --      Head Circumference --      Peak Flow --      Pain Score 05/12/20 1137 9     Pain Loc --      Pain Edu? --      Excl. in GC? --    No data found.  Updated Vital Signs BP (!) 131/93 (BP Location: Right Arm)   Pulse 90   Temp 98.2 F (36.8 C) (Oral)   Resp 20   LMP 04/24/2020 (Approximate)   SpO2 98%   Visual Acuity Right Eye Distance:   Left Eye Distance:   Bilateral Distance:    Right Eye Near:   Left Eye Near:    Bilateral Near:     Physical Exam Vitals and nursing note reviewed.  Constitutional:      General: She is not in acute distress.    Appearance: Normal appearance. She is well-developed. She is obese. She is not ill-appearing or toxic-appearing.  HENT:     Head: Normocephalic and atraumatic.     Right Ear: Tympanic membrane and ear canal normal. No swelling or tenderness. No middle ear effusion. Tympanic membrane is not injected, scarred, perforated, retracted or bulging.     Left Ear: Tympanic membrane and ear canal normal. No swelling or tenderness.  No middle ear effusion. Tympanic membrane is not injected, scarred, perforated, retracted or bulging.     Nose: Mucosal edema, congestion and rhinorrhea present. Rhinorrhea is clear.     Right Turbinates: Swollen. Not enlarged.     Left Turbinates: Swollen. Not enlarged.     Right Sinus: No maxillary sinus tenderness or frontal sinus tenderness.  Left Sinus: No maxillary sinus tenderness or frontal sinus tenderness.     Mouth/Throat:     Mouth: Mucous membranes are moist.     Pharynx: Oropharynx is clear. Uvula midline. No pharyngeal swelling, oropharyngeal exudate, posterior oropharyngeal erythema or uvula swelling.     Tonsils: No tonsillar exudate or tonsillar abscesses. 0 on the right. 0 on the left.     Comments: Voice hoarse Eyes:      Conjunctiva/sclera: Conjunctivae normal.  Cardiovascular:     Rate and Rhythm: Normal rate and regular rhythm.     Heart sounds: No murmur heard.   Pulmonary:     Effort: Pulmonary effort is normal. No respiratory distress.     Breath sounds: Normal breath sounds. No wheezing, rhonchi or rales.  Musculoskeletal:        General: Normal range of motion.     Cervical back: Neck supple.  Skin:    General: Skin is warm and dry.     Capillary Refill: Capillary refill takes less than 2 seconds.  Neurological:     General: No focal deficit present.     Mental Status: She is alert and oriented to person, place, and time.     Motor: No weakness.     Gait: Gait normal.  Psychiatric:        Mood and Affect: Mood normal.        Behavior: Behavior normal.      UC Treatments / Results  Labs (all labs ordered are listed, but only abnormal results are displayed) Labs Reviewed  RESP PANEL BY RT-PCR (FLU A&B, COVID) [id number]    EKG   Radiology DG Chest 2 View  Result Date: 05/12/2020 CLINICAL DATA:  Cough and dyspnea EXAM: CHEST - 2 VIEW COMPARISON:  12/11/2019 chest radiograph. FINDINGS: Stable cardiomediastinal silhouette with normal heart size. No pneumothorax. No pleural effusion. Faint hazy right middle lobe opacity. Otherwise clear lungs. IMPRESSION: Faint hazy right middle lobe opacity, cannot exclude early/mild right middle lobe pneumonia. Electronically Signed   By: Delbert Phenix M.D.   On: 05/12/2020 12:12    Procedures Procedures (including critical care time)  Medications Ordered in UC Medications - No data to display  Initial Impression / Assessment and Plan / UC Course  I have reviewed the triage vital signs and the nursing notes.  Pertinent labs & imaging results that were available during my care of the patient were reviewed by me and considered in my medical decision making (see chart for details).     Take medication as prescribed Follow up with PCP Go to ED  with worsening symptoms. Final Clinical Impressions(s) / UC Diagnoses   Final diagnoses:  Community acquired pneumonia of right middle lobe of lung  Cough  SOB (shortness of breath)  Encounter for laboratory testing for COVID-19 virus   Discharge Instructions   None    ED Prescriptions    Medication Sig Dispense Auth. Provider   albuterol (VENTOLIN HFA) 108 (90 Base) MCG/ACT inhaler Inhale 2 puffs into the lungs every 4 (four) hours as needed for wheezing or shortness of breath. 18 g Evern Core, PA-C   amoxicillin (AMOXIL) 500 MG capsule Take 2 capsules (1,000 mg total) by mouth 3 (three) times daily for 7 days. 42 capsule Evern Core, PA-C   azithromycin (ZITHROMAX) 250 MG tablet Take 1 tablet (250 mg total) by mouth daily. Take first 2 tablets together, then 1 every day until finished. 6 tablet Evern Core, PA-C     PDMP  not reviewed this encounter.   Evern Core, PA-C 05/12/20 1241

## 2020-05-16 ENCOUNTER — Ambulatory Visit (HOSPITAL_COMMUNITY): Payer: Medicaid Other

## 2020-05-19 ENCOUNTER — Telehealth: Payer: Self-pay

## 2020-05-19 NOTE — Telephone Encounter (Signed)
Received a message on my voicemail from 05/15/20. Patient has positive COVID test and her CT will be cancelled.  I was out of the office from 05/12/20 to 05/19/20.

## 2020-05-21 ENCOUNTER — Ambulatory Visit (HOSPITAL_COMMUNITY): Payer: Medicaid Other

## 2020-05-23 ENCOUNTER — Other Ambulatory Visit (HOSPITAL_COMMUNITY): Payer: Self-pay | Admitting: Internal Medicine

## 2020-05-29 ENCOUNTER — Encounter (HOSPITAL_COMMUNITY): Payer: Self-pay | Admitting: Emergency Medicine

## 2020-05-29 ENCOUNTER — Ambulatory Visit (HOSPITAL_COMMUNITY)
Admission: EM | Admit: 2020-05-29 | Discharge: 2020-05-29 | Disposition: A | Discharge status: Home / Self Care | Payer: Medicaid Other | Attending: Family Medicine | Admitting: Family Medicine

## 2020-05-29 ENCOUNTER — Other Ambulatory Visit: Payer: Self-pay

## 2020-05-29 ENCOUNTER — Ambulatory Visit (INDEPENDENT_AMBULATORY_CARE_PROVIDER_SITE_OTHER): Payer: Medicaid Other

## 2020-05-29 DIAGNOSIS — J4521 Mild intermittent asthma with (acute) exacerbation: Secondary | ICD-10-CM | POA: Diagnosis not present

## 2020-05-29 DIAGNOSIS — U071 COVID-19: Secondary | ICD-10-CM | POA: Diagnosis not present

## 2020-05-29 DIAGNOSIS — R059 Cough, unspecified: Secondary | ICD-10-CM

## 2020-05-29 DIAGNOSIS — R058 Other specified cough: Secondary | ICD-10-CM

## 2020-05-29 DIAGNOSIS — R0602 Shortness of breath: Secondary | ICD-10-CM

## 2020-05-29 MED ORDER — DM-GUAIFENESIN ER 30-600 MG PO TB12: 1.0000 | ORAL_TABLET | Freq: Two times a day (BID) | ORAL | 0 refills | Status: DC

## 2020-05-29 MED ORDER — METHYLPREDNISOLONE SODIUM SUCC 125 MG IJ SOLR
80.0000 mg | Freq: Once | INTRAMUSCULAR | Status: AC
  Administered 2020-05-29: 80 mg via INTRAMUSCULAR

## 2020-05-29 MED ORDER — METHYLPREDNISOLONE SODIUM SUCC 125 MG IJ SOLR
INTRAMUSCULAR | Status: AC
  Filled 2020-05-29: qty 2

## 2020-05-29 MED ORDER — PREDNISONE 20 MG PO TABS: 20.0000 mg | ORAL_TABLET | Freq: Two times a day (BID) | ORAL | 0 refills | Status: DC

## 2020-05-29 MED ORDER — BENZONATATE 200 MG PO CAPS: 200.0000 mg | ORAL_CAPSULE | Freq: Two times a day (BID) | ORAL | 0 refills | Status: DC | PRN

## 2020-05-29 NOTE — ED Provider Notes (Signed)
[facility]    CSN: [id number] Arrival date & time: 05/29/20  1847      History   Chief Complaint Chief Complaint  Patient presents with  . Asthma    HPI Copper Kirtley is a 35 y.o. female.   HPI  Patient has a history of mild intermittent asthma.  Uses an albuterol inhaler as needed.  She states that she has been sick since 05/12/2020.  Was seen here for upper respiratory infection.  Found to have pneumonia.  Treated with amoxicillin and azithromycin.  She had a hazy right middle lobe pneumonia.  After discharge was found subsequently to have positive COVID test.  It was thought that she had a COVID-pneumonia.  She did take all of the antibiotics. She has continued to cough ever since then.  She has unremitting cough.  She states her chest is sore from coughing.  She tried to go back to work.  Her voice is hoarse.  No runny nose.  No headache or body aches.  She does has fatigue and cough.  Her cough is not responding to albuterol. Has chronic abdominal pain.  Was scheduled to have a CT of her abdomen.  This had to be put off because of her COVID-positive test.  It has not yet been rescheduled.   Past Medical History:  Diagnosis Date  . Acid reflux 2004  . Anemia   . Anxiety 2008  . Asthma 1993   no previous intubations or hospitalizations   . Chlamydia   . Depression 2008   no meds, currenly ok  . Environmental allergies   . GERD (gastroesophageal reflux disease)   . Migraines   . Miscarriage   . Ovarian cyst   . Pre-diabetes   . Ulcer of the stomach and intestine 2004  . Urinary tract infection     Patient Active Problem List   Diagnosis Date Noted  . Chronic low back pain 12/26/2019  . Anemia in pregnancy 01/23/2018  . Morbid obesity with BMI of 45.0-49.9, adult (HCC) 10/19/2017  . Seasonal allergic rhinitis 11/01/2016  . Asthma 06/30/2015  . Vitamin D deficiency 01/22/2015  . Anxiety and depression 01/21/2015    Past Surgical History:   Procedure Laterality Date  . COLONOSCOPY  2004   . IRRIGATION AND DEBRIDEMENT SEBACEOUS CYST N/A 06/01/2017   Procedure: EXCISION SEBACEOUS CYST ON SCALP;  Surgeon: Ancil Linsey, MD;  Location: [facility];  Service: General;  Laterality: N/A;  . MULTIPLE TOOTH EXTRACTIONS  2015   6 teeth removed   . TUBAL LIGATION N/A 04/04/2018   Procedure: POST PARTUM TUBAL LIGATION;  Surgeon: Levie Heritage, DO;  Location: [facility];  Service: Gynecology;  Laterality: N/A;  . WISDOM TOOTH EXTRACTION  2007    OB History    Gravida  7   Para  4   Term  4   Preterm  0   AB  3   Living  4     SAB  3   IAB  0   Ectopic  0   Multiple  0   Live Births  4            Home Medications    Prior to Admission medications   Medication Sig Start Date End Date Taking? Authorizing Provider  benzonatate (TESSALON) 200 MG capsule Take 1 capsule (200 mg total) by mouth 2 (two) times daily as needed for cough. 05/29/20  Yes Eustace Moore, MD  dextromethorphan-guaiFENesin [facility] DM)  30-600 MG 12hr tablet Take 1 tablet by mouth 2 (two) times daily. 05/29/20  Yes Eustace Moore, MD  predniSONE (DELTASONE) 20 MG tablet Take 1 tablet (20 mg total) by mouth 2 (two) times daily with a meal. 05/29/20  Yes Eustace Moore, MD  acetaminophen (TYLENOL) 500 MG tablet Take 500-1,000 mg by mouth every 6 (six) hours as needed for mild pain, moderate pain or headache (or migraines).     [provider]  albuterol (PROVENTIL) (2.5 MG/3ML) 0.083% nebulizer solution Take 3 mLs (2.5 mg total) by nebulization every 6 (six) hours as needed for wheezing or shortness of breath. 10/14/19   Moshe Cipro, NP  albuterol (VENTOLIN HFA) 108 (90 Base) MCG/ACT inhaler Inhale 1-2 puffs into the lungs every 6 (six) hours as needed for wheezing or shortness of breath.    [provider]  albuterol (VENTOLIN HFA) 108 (90 Base) MCG/ACT inhaler Inhale 2 puffs into the lungs every 4 (four)  hours as needed for wheezing or shortness of breath. 05/12/20   Evern Core, PA-C  azithromycin (ZITHROMAX) 250 MG tablet Take 1 tablet (250 mg total) by mouth daily. Take first 2 tablets together, then 1 every day until finished. 05/12/20   Evern Core, PA-C  cetirizine (ZYRTEC) 10 MG tablet Take 1 tablet (10 mg total) by mouth daily. Prn itching 01/02/20   Anders Simmonds, PA-C  diphenhydrAMINE (BENADRYL) 25 mg capsule Take 25-50 mg by mouth 2 (two) times daily as needed for allergies.    [provider]  DULoxetine (CYMBALTA) 30 MG capsule Take 1 capsule (30 mg total) by mouth daily. 12/14/19 01/13/20  Claiborne Rigg, NP  escitalopram (LEXAPRO) 10 MG tablet Take 1 tablet (10 mg total) by mouth daily. Patient not taking: Reported on 10/05/2019 06/27/19   Cain Saupe, MD  ferrous sulfate 324 (65 Fe) MG TBEC 1 pill daily after meal to help with anemia 11/22/18   [name], [name], MD  fexofenadine (ALLEGRA) 180 MG tablet Take 180 mg by mouth daily as needed for allergies or rhinitis.     [provider]  fluticasone (FLONASE) 50 MCG/ACT nasal spray Place 1 spray into both nostrils daily.    [provider]  hydrOXYzine (ATARAX/VISTARIL) 10 MG tablet Take 1 tablet (10 mg total) by mouth every 6 (six) hours as needed for itching. 12/16/19   Claiborne Rigg, NP  meloxicam (MOBIC) 15 MG tablet Take 1 tablet (15 mg total) by mouth daily. 01/02/20   Anders Simmonds, PA-C  montelukast (SINGULAIR) 10 MG tablet Take 1 tablet (10 mg total) by mouth at bedtime. 10/14/19   Moshe Cipro, NP  omeprazole (PRILOSEC) 40 MG capsule Take 1 capsule (40 mg total) by mouth daily. To reduce stomach acid 03/21/20   Esterwood, Amy S, PA-C  phentermine (ADIPEX-P) 37.5 MG tablet Take 37.5 mg by mouth at bedtime. 03/13/20   [provider]  SUMAtriptan (IMITREX) 50 MG tablet Take 1 tablet (50 mg total) by mouth once as needed (at the onset of a migraine- may repeat once in 2 hours if  headache persists or recurs). 11/22/18   Hoy Register, MD  topiramate (TOPAMAX) 50 MG tablet Take 1 tablet (50 mg total) by mouth 2 (two) times daily. 11/22/18   Hoy Register, MD  dicyclomine (BENTYL) 10 MG capsule Take 1 capsule (10 mg total) by mouth 4 (four) times daily -  before meals and at bedtime. For stomach cramping Patient not taking: Reported on 10/25/2019 06/13/19 10/25/19  [name], [name], MD  sucralfate (CARAFATE) 1 GM/10ML suspension Take 10 mLs (1 g total) by mouth 4 (four) times daily -  with meals and at bedtime. Patient not taking: Reported on 10/25/2019 06/13/19 10/25/19  Cain Saupe, MD    Family History Family History  Problem Relation Age of Onset  . Asthma Mother   . Heart disease Mother   . Hypertension Mother   . Clotting disorder Mother   . Diabetes Mother   . Asthma Maternal Aunt   . Hypertension Maternal Aunt   . Diabetes Maternal Aunt   . Asthma Maternal Uncle   . Hypertension Maternal Uncle   . Diabetes Maternal Uncle   . Heart disease Maternal Grandmother   . Diabetes Maternal Grandmother   . Hypertension Maternal Grandmother   . Heart disease Maternal Grandfather   . Diabetes Maternal Grandfather   . Hypertension Maternal Grandfather   . Hypertension Paternal Grandmother   . Diabetes Maternal Uncle   . Heart disease Maternal Uncle   . Diabetes Maternal Uncle   . Cancer Paternal Aunt   . Hypotension Neg Hx   . Anesthesia problems Neg Hx   . Malignant hyperthermia Neg Hx   . Pseudochol deficiency Neg Hx     Social History Social History   Tobacco Use  . Smoking status: Never Smoker  . Smokeless tobacco: Never Used  Vaping Use  . Vaping Use: Never used  Substance Use Topics  . Alcohol use: No    Alcohol/week: 0.0 standard drinks  . Drug use: No     Allergies   Latex, Other, Ondansetron, Tree extract, and Zofran   Review of Systems Review of Systems See HPI  Physical Exam Triage Vital Signs ED Triage Vitals  Enc Vitals Group      BP 05/29/20 1915 (!) 140/100     Pulse Rate 05/29/20 1915 (!) 104     Resp 05/29/20 1915 20     Temp 05/29/20 1915 98.5 F (36.9 C)     Temp Source 05/29/20 1915 Oral     SpO2 05/29/20 1915 99 %     Weight --      Height --      Head Circumference --      Peak Flow --      Pain Score 05/29/20 1916 9     Pain Loc --      Pain Edu? --      Excl. in GC? --    No data found.  Updated Vital Signs BP (!) 140/100 (BP Location: Left Arm)   Pulse (!) 104   Temp 98.5 F (36.9 C) (Oral)   Resp 20   LMP 05/22/2020   SpO2 99%       Physical Exam Constitutional:      General: She is not in acute distress.    Appearance: She is well-developed and well-nourished. She is obese.     Comments: Frequent cough.  Mild dyspnea.  Able to speak in complete sentences.  Hoarse voice  HENT:     Head: Normocephalic and atraumatic.     Nose: Nose normal. No congestion.     Mouth/Throat:     Mouth: Mucous membranes are moist.     Pharynx: No posterior oropharyngeal erythema.  Eyes:     Conjunctiva/sclera: Conjunctivae normal.     Pupils: Pupils are equal, round, and reactive to light.  Cardiovascular:     Rate and Rhythm: Normal rate and regular rhythm.     Heart  sounds: Normal heart sounds.  Pulmonary:     Effort: Pulmonary effort is normal. No respiratory distress.     Breath sounds: Normal breath sounds. No wheezing or rales.     Comments: Frequent cough.  Wheezy cough.  Lungs clear on inspiration Abdominal:     General: There is no distension.     Palpations: Abdomen is soft.  Musculoskeletal:        General: No edema. Normal range of motion.     Cervical back: Normal range of motion.  Skin:    General: Skin is warm and dry.  Neurological:     Mental Status: She is alert.  Psychiatric:        Behavior: Behavior normal.      UC Treatments / Results  Labs (all labs ordered are listed, but only abnormal results are displayed) Labs Reviewed - No data to  display  EKG   Radiology No results found.  Procedures Procedures (including critical care time)  Medications Ordered in UC Medications  methylPREDNISolone sodium succinate (SOLU-MEDROL) 125 mg/2 mL injection 80 mg (80 mg Intramuscular Given 05/29/20 2013)    Initial Impression / Assessment and Plan / UC Course  I have reviewed the triage vital signs and the nursing notes.  Pertinent labs & imaging results that were available during my care of the patient were reviewed by me and considered in my medical decision making (see chart for details).     Vital signs stable.  Oxygenation is good.  Blood pressure elevated.  Mild tachycardia Repeat chest x-ray secondary to difficulty breathing.  It shows improvement in her prior pneumonia. Final Clinical Impressions(s) / UC Diagnoses   Final diagnoses:  Mild intermittent asthma with exacerbation  Shortness of breath  Cough  Post-viral cough syndrome     Discharge Instructions     Drink more water Run a humidifier if you have one Take the prednisone 2 x a day Take tessalon 2 x a day Take in addition mucinex DM 2 x a day Return here to to your PCP if not improving by next week        ED Prescriptions    Medication Sig Dispense Auth. Provider   predniSONE (DELTASONE) 20 MG tablet Take 1 tablet (20 mg total) by mouth 2 (two) times daily with a meal. 10 tablet Eustace MooreNelson, Ziv Welchel Sue, MD   benzonatate (TESSALON) 200 MG capsule Take 1 capsule (200 mg total) by mouth 2 (two) times daily as needed for cough. 20 capsule Eustace MooreNelson, Ernie Kasler Sue, MD   dextromethorphan-guaiFENesin [facility](MUCINEX DM) 30-600 MG 12hr tablet Take 1 tablet by mouth 2 (two) times daily. 20 tablet Eustace MooreNelson, Avaley Coop Sue, MD     PDMP not reviewed this encounter.   Eustace MooreNelson, Dong Nimmons Sue, MD 06/01/20 [phone]

## 2020-05-29 NOTE — Discharge Instructions (Addendum)
Drink more water Run a humidifier if you have one Take the prednisone 2 x a day Take tessalon 2 x a day Take in addition mucinex DM 2 x a day Return here to to your PCP if not improving by next week

## 2020-05-29 NOTE — ED Triage Notes (Signed)
Pt here for persistent dry cough onset 2 weeks   Sts she tested positive for COVID on 12/27 and was dx w/COVID PNA  Sx today include chest discomfort d/t cough and SOB/dyspnea.... Using her rescue inhalers w/temp relief  A&O x4... NAD.Marland Kitchen. ambulatory

## 2020-06-06 ENCOUNTER — Other Ambulatory Visit (HOSPITAL_COMMUNITY): Payer: Self-pay | Admitting: Internal Medicine

## 2020-06-09 ENCOUNTER — Other Ambulatory Visit (HOSPITAL_COMMUNITY): Payer: Self-pay | Admitting: Internal Medicine

## 2020-06-18 ENCOUNTER — Other Ambulatory Visit (HOSPITAL_COMMUNITY): Payer: Self-pay | Admitting: Internal Medicine

## 2020-07-16 ENCOUNTER — Other Ambulatory Visit (HOSPITAL_COMMUNITY): Payer: Self-pay | Admitting: Internal Medicine

## 2020-07-18 DIAGNOSIS — G5602 Carpal tunnel syndrome, left upper limb: Secondary | ICD-10-CM | POA: Insufficient documentation

## 2020-07-18 DIAGNOSIS — G5601 Carpal tunnel syndrome, right upper limb: Secondary | ICD-10-CM | POA: Insufficient documentation

## 2020-08-28 ENCOUNTER — Other Ambulatory Visit (HOSPITAL_COMMUNITY): Payer: Self-pay

## 2020-08-28 MED ORDER — HYDROCODONE-ACETAMINOPHEN 5-325 MG PO TABS
ORAL_TABLET | ORAL | 0 refills | Status: DC
  Filled 2020-08-28: qty 20, 5d supply, fill #0

## 2020-09-03 ENCOUNTER — Other Ambulatory Visit (HOSPITAL_COMMUNITY): Payer: Self-pay

## 2020-09-03 MED ORDER — AMOXICILLIN 500 MG PO CAPS
ORAL_CAPSULE | ORAL | 0 refills | Status: DC
  Filled 2020-09-03: qty 21, 7d supply, fill #0

## 2020-09-08 ENCOUNTER — Other Ambulatory Visit (HOSPITAL_COMMUNITY): Payer: Self-pay

## 2020-09-08 MED ORDER — HYDROCODONE-ACETAMINOPHEN 5-325 MG PO TABS
ORAL_TABLET | ORAL | 0 refills | Status: DC
  Filled 2020-09-08: qty 20, 5d supply, fill #0

## 2020-09-09 ENCOUNTER — Other Ambulatory Visit (HOSPITAL_COMMUNITY): Payer: Self-pay

## 2020-09-12 ENCOUNTER — Other Ambulatory Visit (HOSPITAL_COMMUNITY): Payer: Self-pay

## 2020-09-12 MED ORDER — ALBUTEROL SULFATE HFA 108 (90 BASE) MCG/ACT IN AERS
INHALATION_SPRAY | RESPIRATORY_TRACT | 5 refills | Status: DC
  Filled 2020-09-12: qty 8.5, 25d supply, fill #0
  Filled 2021-04-08: qty 8.5, 25d supply, fill #1

## 2020-09-12 MED ORDER — PHENTERMINE HCL 37.5 MG PO TABS
ORAL_TABLET | ORAL | 0 refills | Status: DC
  Filled 2020-09-12: qty 30, 30d supply, fill #0

## 2020-09-12 MED ORDER — CETIRIZINE HCL 10 MG PO TABS
ORAL_TABLET | ORAL | 2 refills | Status: DC
  Filled 2020-09-12 – 2021-04-08 (×2): qty 30, 30d supply, fill #0
  Filled 2021-05-13: qty 30, 30d supply, fill #1

## 2020-09-12 MED ORDER — FLUTICASONE PROPIONATE 50 MCG/ACT NA SUSP
NASAL | 5 refills | Status: DC
  Filled 2020-09-12: qty 16, 30d supply, fill #0
  Filled 2021-04-08: qty 16, 30d supply, fill #1
  Filled 2021-05-13: qty 16, 30d supply, fill #2
  Filled 2021-08-14: qty 16, 30d supply, fill #0

## 2020-09-12 MED ORDER — FLUOCINOLONE ACETONIDE BODY 0.01 % EX OIL
TOPICAL_OIL | CUTANEOUS | 2 refills | Status: DC
  Filled 2020-09-12: qty 118.28, 30d supply, fill #0
  Filled 2021-04-08: qty 118.28, 30d supply, fill #1
  Filled 2021-05-13: qty 118.28, 30d supply, fill #2

## 2020-09-12 MED ORDER — TIZANIDINE HCL 4 MG PO TABS
ORAL_TABLET | ORAL | 5 refills | Status: DC
  Filled 2020-09-12: qty 60, 30d supply, fill #0
  Filled 2021-04-08: qty 60, 30d supply, fill #1
  Filled 2021-05-13: qty 60, 30d supply, fill #2
  Filled 2021-08-14: qty 60, 30d supply, fill #0

## 2020-09-15 ENCOUNTER — Other Ambulatory Visit (HOSPITAL_COMMUNITY): Payer: Self-pay

## 2020-10-02 ENCOUNTER — Other Ambulatory Visit (HOSPITAL_COMMUNITY): Payer: Self-pay

## 2020-10-11 ENCOUNTER — Ambulatory Visit
Admission: EM | Admit: 2020-10-11 | Discharge: 2020-10-11 | Disposition: A | Discharge status: Home / Self Care | Payer: Medicaid Other

## 2020-10-11 ENCOUNTER — Other Ambulatory Visit: Payer: Self-pay

## 2020-10-11 ENCOUNTER — Encounter: Payer: Self-pay | Admitting: Emergency Medicine

## 2020-10-11 DIAGNOSIS — S46812A Strain of other muscles, fascia and tendons at shoulder and upper arm level, left arm, initial encounter: Secondary | ICD-10-CM

## 2020-10-11 DIAGNOSIS — G5602 Carpal tunnel syndrome, left upper limb: Secondary | ICD-10-CM | POA: Diagnosis not present

## 2020-10-11 HISTORY — DX: Carpal tunnel syndrome, unspecified upper limb: G56.00

## 2020-10-11 NOTE — Discharge Instructions (Signed)
Follow up as planned for surgery Alternate ice and heat

## 2020-10-11 NOTE — ED Triage Notes (Signed)
Pt sts left arm pain and numbness x months; pt sts having carpal tunnel surgery on Wednesday but requesting work note due to pain currently

## 2020-10-11 NOTE — ED Provider Notes (Signed)
[facility]    CSN: [id number] Arrival date & time: 10/11/20  1012      History   Chief Complaint Chief Complaint  Patient presents with  . Arm Pain    HPI Laurie Reed is a 35 y.o. female history of migraines, GERD, asthma, presenting to today for evaluation of left arm pain.  Reports left arm pain for months, extensive carpal tunnel release surgery on this upcoming Wednesday.  She has had increased pain recently that radiates up into her upper arm and upper back on the left side.  She denies any new injury or trauma.  Patient works requiring a lot of lifting, and does not feel she is able to perform her job.  Needing work note to excuse her for surgery.  HPI  Past Medical History:  Diagnosis Date  . Acid reflux 2004  . Anemia   . Anxiety 2008  . Asthma 1993   no previous intubations or hospitalizations   . Carpal tunnel syndrome   . Chlamydia   . Depression 2008   no meds, currenly ok  . Environmental allergies   . GERD (gastroesophageal reflux disease)   . Migraines   . Miscarriage   . Ovarian cyst   . Pre-diabetes   . Ulcer of the stomach and intestine 2004  . Urinary tract infection     Patient Active Problem List   Diagnosis Date Noted  . Chronic low back pain 12/26/2019  . Anemia in pregnancy 01/23/2018  . Morbid obesity with BMI of 45.0-49.9, adult (HCC) 10/19/2017  . Seasonal allergic rhinitis 11/01/2016  . Asthma 06/30/2015  . Vitamin D deficiency 01/22/2015  . Anxiety and depression 01/21/2015    Past Surgical History:  Procedure Laterality Date  . COLONOSCOPY  2004   . IRRIGATION AND DEBRIDEMENT SEBACEOUS CYST N/A 06/01/2017   Procedure: EXCISION SEBACEOUS CYST ON SCALP;  Surgeon: Ancil Linsey, MD;  Location: [facility];  Service: General;  Laterality: N/A;  . MULTIPLE TOOTH EXTRACTIONS  2015   6 teeth removed   . TUBAL LIGATION N/A 04/04/2018   Procedure: POST PARTUM TUBAL LIGATION;  Surgeon: Levie Heritage, DO;   Location: [facility];  Service: Gynecology;  Laterality: N/A;  . WISDOM TOOTH EXTRACTION  2007    OB History    Gravida  7   Para  4   Term  4   Preterm  0   AB  3   Living  4     SAB  3   IAB  0   Ectopic  0   Multiple  0   Live Births  4            Home Medications    Prior to Admission medications   Medication Sig Start Date End Date Taking? Authorizing Provider  acetaminophen (TYLENOL) 500 MG tablet Take 500-1,000 mg by mouth every 6 (six) hours as needed for mild pain, moderate pain or headache (or migraines).     [provider]  albuterol (PROVENTIL) (2.5 MG/3ML) 0.083% nebulizer solution Take 3 mLs (2.5 mg total) by nebulization every 6 (six) hours as needed for wheezing or shortness of breath. 10/14/19   Moshe Cipro, NP  albuterol (PROVENTIL) (2.5 MG/3ML) 0.083% nebulizer solution USE 1 VIAL EVERY 6 HOURS AND AS NEEDED DIAGNOSIS: ASTHMA: J44.9 03/12/20 03/12/21  Fleet Contras, MD  albuterol (VENTOLIN HFA) 108 (90 Base) MCG/ACT inhaler Inhale 1-2 puffs into the lungs every 6 (six) hours as needed for wheezing  or shortness of breath.    [provider]  albuterol (VENTOLIN HFA) 108 (90 Base) MCG/ACT inhaler INHALE 2 PUFFS INTO THE LUNGS EVERY 4 (FOUR) HOURS AS NEEDED FOR WHEEZING OR SHORTNESS OF BREATH. 05/12/20 05/12/21  Evern Core, PA-C  albuterol (VENTOLIN HFA) 108 (90 Base) MCG/ACT inhaler Inhale 2 puffs into the lungs four times a day as needed 09/12/20     cetirizine (ZYRTEC) 10 MG tablet Take 1 tablet (10 mg total) by mouth daily. Prn itching 01/02/20   Anders Simmonds, PA-C  cetirizine (ZYRTEC) 10 MG tablet TAKE 1 TABLET BY MOUTH EVERY EVENING AS NEEDED FOR ALLERGIES 06/06/20 06/06/21  Fleet Contras, MD  cetirizine (ZYRTEC) 10 MG tablet Take 1 tablet by mouth every evening as needed for allergies 09/12/20     Chlorphen-PE-Acetaminophen 4-10-325 MG TABS TAKE 1 TABLET BY MOUTH TWICE DAILY AS NEEDED FOR COUGH AND  CONGESTION 05/23/20 05/23/21  Fleet Contras, MD  chlorpheniramine-HYDROcodone (TUSSIONEX) 10-8 MG/5ML SUER TAKE 5 ML BY MOUTH EVERY 12 HOURS AS NEEDED Patient not taking: Reported on 10/11/2020 06/09/20 12/06/20  Fleet Contras, MD  ciprofloxacin (CIPRO) 250 MG tablet TAKE 1 TABLET BY MOUTH EVERY 12 HOURS Patient not taking: Reported on 10/11/2020 07/16/20 07/16/21  Fleet Contras, MD  dextromethorphan-guaiFENesin (MUCINEX DM) 30-600 MG 12hr tablet Take 1 tablet by mouth 2 (two) times daily. 05/29/20   Eustace Moore, MD  diclofenac (VOLTAREN) 75 MG EC tablet TAKE 1 TABLET BY MOUTH 2 TIMES DAILY WITH FOOD AS NEEDED 05/23/20 05/23/21  Fleet Contras, MD  diphenhydrAMINE (BENADRYL) 25 mg capsule Take 25-50 mg by mouth 2 (two) times daily as needed for allergies.    [provider]  DULoxetine (CYMBALTA) 30 MG capsule Take 1 capsule (30 mg total) by mouth daily. 12/14/19 01/13/20  Claiborne Rigg, NP  escitalopram (LEXAPRO) 10 MG tablet Take 1 tablet (10 mg total) by mouth daily. Patient not taking: Reported on 10/05/2019 06/27/19   Cain Saupe, MD  ferrous sulfate 324 (65 Fe) MG TBEC 1 pill daily after meal to help with anemia 11/22/18   [name], [name], MD  fexofenadine (ALLEGRA) 180 MG tablet Take 180 mg by mouth daily as needed for allergies or rhinitis.     [provider]  fluconazole (DIFLUCAN) 150 MG tablet TAKE 1 TABLET BY MOUTH WEEKLY AS NEEDED. Patient not taking: Reported on 10/11/2020 07/16/20 07/16/21  Fleet Contras, MD  fluconazole (DIFLUCAN) 150 MG tablet TAKE 1 TABLET BY MOUTH ONCE WEEKLY AS NEEDED 04/23/20 04/23/21  Fleet Contras, MD  Fluocinolone Acetonide Body 0.01 % OIL Apply as directed 09/12/20     fluticasone (FLONASE) 50 MCG/ACT nasal spray Place 1 spray into both nostrils daily.    [provider]  fluticasone (FLONASE) 50 MCG/ACT nasal spray Use 2 sprays in each nostril once daily 09/12/20     HYDROcodone-acetaminophen (NORCO/VICODIN) 5-325 MG tablet Take 1 tablet by mouth  every 6 hours for 7 days. Patient not taking: Reported on 10/11/2020 08/28/20     HYDROcodone-acetaminophen (NORCO/VICODIN) 5-325 MG tablet Take 1 tablet every 6 hours by mouth for 7 days. Patient not taking: Reported on 10/11/2020 09/08/20     hydrocortisone 2.5 % cream APPLY 2 TIMES DAILY AS NEEDED FOR BUMPS 07/16/20 07/16/21  Fleet Contras, MD  hydrOXYzine (ATARAX/VISTARIL) 10 MG tablet Take 1 tablet (10 mg total) by mouth every 6 (six) hours as needed for itching. 12/16/19   Claiborne Rigg, NP  meloxicam (MOBIC) 15 MG tablet Take 1 tablet (15 mg  total) by mouth daily. Patient not taking: Reported on 10/11/2020 01/02/20   Anders SimmondsMcClung, Angela M, PA-C  meloxicam (MOBIC) 15 MG tablet TAKE 1 TABLET BY MOUTH ONCE A DAY WITH FOOD Patient not taking: Reported on 10/11/2020 04/23/20 04/23/21  Fleet ContrasAvbuere, Edwin, MD  montelukast (SINGULAIR) 10 MG tablet Take 1 tablet (10 mg total) by mouth at bedtime. 10/14/19   Moshe CiproMatthews, Stephanie, NP  ofloxacin (OCUFLOX) 0.3 % ophthalmic solution INSTILL 3 DROPS IN AFFECTED EAR ONCE DAILY Patient not taking: Reported on 10/11/2020 03/12/20 03/12/21  Fleet ContrasAvbuere, Edwin, MD  omeprazole (PRILOSEC) 40 MG capsule TAKE 1 CAPSULE (40 MG TOTAL) BY MOUTH DAILY. TO REDUCE STOMACH ACID 03/21/20 03/21/21  Esterwood, Amy S, PA-C  phentermine (ADIPEX-P) 37.5 MG tablet Take 37.5 mg by mouth at bedtime. 03/13/20   [provider]  phentermine (ADIPEX-P) 37.5 MG tablet TAKE 1 TABLET BY MOUTH EVERY MORNING 06/06/20 12/03/20  Fleet ContrasAvbuere, Edwin, MD  phentermine (ADIPEX-P) 37.5 MG tablet TAKE ONE TABLET BY MOUTH EVERY MORNING 03/12/20 09/08/20  Fleet ContrasAvbuere, Edwin, MD  phentermine (ADIPEX-P) 37.5 MG tablet Take 1 tablet by mouth every morning 09/12/20     predniSONE (DELTASONE) 20 MG tablet Take 1 tablet (20 mg total) by mouth 2 (two) times daily with a meal. Patient not taking: Reported on 10/11/2020 05/29/20   Eustace MooreNelson, Yvonne Sue, MD  predniSONE (DELTASONE) 20 MG tablet TAKE 1 TABLET BY MOUTH ONCE A DAY FOR 7  DAYS Patient not taking: Reported on 10/11/2020 06/06/20 06/06/21  Fleet ContrasAvbuere, Edwin, MD  PROAIR HFA 108 [phone](90 Base) MCG/ACT inhaler INHALE 2 PUFFS INTO THE LUNGS 4 TIMES DAILY AS NEEDED 03/12/20 03/12/21  Fleet ContrasAvbuere, Edwin, MD  SUMAtriptan (IMITREX) 50 MG tablet Take 1 tablet (50 mg total) by mouth once as needed (at the onset of a migraine- may repeat once in 2 hours if headache persists or recurs). 11/22/18   Hoy RegisterNewlin, Enobong, MD  SYMBICORT 160-4.5 MCG/ACT inhaler INHALE 2 PUFFS INTO THE LUNGS 2 TIMES DAILY 06/06/20 06/06/21  Fleet ContrasAvbuere, Edwin, MD  tiZANidine (ZANAFLEX) 4 MG tablet TAKE 1 TABLET BY MOUTH 2 TIMES DAILY AS NEEDED 04/23/20 04/23/21  Fleet ContrasAvbuere, Edwin, MD  tiZANidine (ZANAFLEX) 4 MG tablet Take 1 tablet by mouth twice daily as needed 09/12/20     topiramate (TOPAMAX) 50 MG tablet Take 1 tablet (50 mg total) by mouth 2 (two) times daily. 11/22/18   Hoy RegisterNewlin, Enobong, MD  triamcinolone (KENALOG) 0.1 % APPLY TO AFFECTED AREA 2 TIMES DAILY AS NEEDED 06/18/20 06/18/21  Fleet ContrasAvbuere, Edwin, MD  triamcinolone (KENALOG) 0.1 % APPLY TOPICALLY 2 TIMES DAILY AS NEEDED 04/23/20 04/23/21  Fleet ContrasAvbuere, Edwin, MD  dicyclomine (BENTYL) 10 MG capsule Take 1 capsule (10 mg total) by mouth 4 (four) times daily -  before meals and at bedtime. For stomach cramping Patient not taking: Reported on 10/25/2019 06/13/19 10/25/19  [name], Hewitt Shortsammie, MD  sucralfate (CARAFATE) 1 GM/10ML suspension Take 10 mLs (1 g total) by mouth 4 (four) times daily -  with meals and at bedtime. Patient not taking: Reported on 10/25/2019 06/13/19 10/25/19  Cain SaupeFulp, [name], MD    Family History Family History  Problem Relation Age of Onset  . Asthma Mother   . Heart disease Mother   . Hypertension Mother   . Clotting disorder Mother   . Diabetes Mother   . Asthma Maternal Aunt   . Hypertension Maternal Aunt   . Diabetes Maternal Aunt   . Asthma Maternal Uncle   . Hypertension Maternal Uncle   . Diabetes Maternal Uncle   .  Heart disease Maternal Grandmother   . Diabetes  Maternal Grandmother   . Hypertension Maternal Grandmother   . Heart disease Maternal Grandfather   . Diabetes Maternal Grandfather   . Hypertension Maternal Grandfather   . Hypertension Paternal Grandmother   . Diabetes Maternal Uncle   . Heart disease Maternal Uncle   . Diabetes Maternal Uncle   . Cancer Paternal Aunt   . Hypotension Neg Hx   . Anesthesia problems Neg Hx   . Malignant hyperthermia Neg Hx   . Pseudochol deficiency Neg Hx     Social History Social History   Tobacco Use  . Smoking status: Never Smoker  . Smokeless tobacco: Never Used  Vaping Use  . Vaping Use: Never used  Substance Use Topics  . Alcohol use: No    Alcohol/week: 0.0 standard drinks  . Drug use: No     Allergies   Latex, Other, Ondansetron, Tree extract, and Zofran   Review of Systems Review of Systems  Constitutional: Negative for fatigue and fever.  Eyes: Negative for visual disturbance.  Respiratory: Negative for shortness of breath.   Cardiovascular: Negative for chest pain.  Gastrointestinal: Negative for abdominal pain, nausea and vomiting.  Musculoskeletal: Positive for arthralgias, back pain and myalgias. Negative for joint swelling.  Skin: Negative for color change, rash and wound.  Neurological: Negative for dizziness, weakness, light-headedness and headaches.     Physical Exam Triage Vital Signs ED Triage Vitals  Enc Vitals Group     BP      Pulse      Resp      Temp      Temp src      SpO2      Weight      Height      Head Circumference      Peak Flow      Pain Score      Pain Loc      Pain Edu?      Excl. in GC?    No data found.  Updated Vital Signs BP (!) 144/82 (BP Location: Right Arm)   Pulse 90   Temp 98.6 F (37 C) (Oral)   Resp 18   SpO2 98%   Visual Acuity Right Eye Distance:   Left Eye Distance:   Bilateral Distance:    Right Eye Near:   Left Eye Near:    Bilateral Near:     Physical Exam Vitals and nursing note reviewed.   Constitutional:      Appearance: She is well-developed.     Comments: No acute distress  HENT:     Head: Normocephalic and atraumatic.     Nose: Nose normal.  Eyes:     Conjunctiva/sclera: Conjunctivae normal.  Cardiovascular:     Rate and Rhythm: Normal rate.  Pulmonary:     Effort: Pulmonary effort is normal. No respiratory distress.  Abdominal:     General: There is no distension.  Musculoskeletal:        General: Normal range of motion.     Cervical back: Neck supple.     Comments: Cervical spine without tenderness midline  Left shoulder: No obvious swelling or deformity, nontender along clavicle, AC joint or scapular spine, tenderness diffusely throughout superior trapezius and periscapular area, tenderness diffusely throughout upper arm as well  Left elbow: Nontender to palpation of bony prominences of elbow, full active range of motion  Left hand: Holding fingers in flexed position at DIP and PIP, resisting passive extension  of fingers, radial pulse 2+  Skin:    General: Skin is warm and dry.  Neurological:     Mental Status: She is alert and oriented to person, place, and time.      UC Treatments / Results  Labs (all labs ordered are listed, but only abnormal results are displayed) Labs Reviewed - No data to display  EKG   Radiology No results found.  Procedures Procedures (including critical care time)  Medications Ordered in UC Medications - No data to display  Initial Impression / Assessment and Plan / UC Course  I have reviewed the triage vital signs and the nursing notes.  Pertinent labs & imaging results that were available during my care of the patient were reviewed by me and considered in my medical decision making (see chart for details).     Providing work note for the next few days to excuse patient from work until up to upcoming surgery, may return once cleared by orthopedics after surgery.  Discussed avoiding NSAIDs prior to surgery, may  use Tylenol, muscle relaxers.  Patient has hydrocodone prescribed from orthopedics.  Discussed strict return precautions. Patient verbalized understanding and is agreeable with plan.  Final Clinical Impressions(s) / UC Diagnoses   Final diagnoses:  Carpal tunnel syndrome of left wrist  Strain of left trapezius muscle, initial encounter     Discharge Instructions     Follow up as planned for surgery Alternate ice and heat    ED Prescriptions    None     PDMP not reviewed this encounter.   Lew Dawes, [place] 10/11/20 1123

## 2020-10-15 ENCOUNTER — Other Ambulatory Visit (HOSPITAL_COMMUNITY): Payer: Self-pay

## 2020-10-15 MED ORDER — OXYCODONE-ACETAMINOPHEN 5-325 MG PO TABS
ORAL_TABLET | ORAL | 0 refills | Status: DC
  Filled 2020-10-15: qty 20, 5d supply, fill #0

## 2020-10-17 ENCOUNTER — Other Ambulatory Visit (HOSPITAL_COMMUNITY): Payer: Self-pay

## 2020-10-17 MED ORDER — PROMETHAZINE HCL 12.5 MG PO TABS
ORAL_TABLET | ORAL | 0 refills | Status: DC
  Filled 2020-10-17: qty 40, 10d supply, fill #0

## 2020-10-27 ENCOUNTER — Other Ambulatory Visit (HOSPITAL_COMMUNITY): Payer: Self-pay

## 2020-10-27 MED ORDER — METRONIDAZOLE 500 MG PO TABS
500.0000 mg | ORAL_TABLET | Freq: Two times a day (BID) | ORAL | 0 refills | Status: DC
  Filled 2020-10-27: qty 14, 7d supply, fill #0

## 2020-10-27 MED ORDER — TOPIRAMATE 100 MG PO TABS
ORAL_TABLET | ORAL | 5 refills | Status: DC
  Filled 2020-10-27: qty 30, 30d supply, fill #0
  Filled 2021-04-08: qty 30, 30d supply, fill #1
  Filled 2021-05-13: qty 30, 30d supply, fill #2
  Filled 2021-08-14: qty 30, 30d supply, fill #0

## 2020-10-27 MED ORDER — SUMATRIPTAN SUCCINATE 100 MG PO TABS
ORAL_TABLET | ORAL | 2 refills | Status: DC
  Filled 2020-10-27: qty 12, 23d supply, fill #0
  Filled 2021-04-08: qty 12, 23d supply, fill #1
  Filled 2021-05-13: qty 12, 23d supply, fill #2
  Filled 2021-08-14: qty 9, 30d supply, fill #0

## 2020-10-27 MED ORDER — FLUCONAZOLE 150 MG PO TABS
ORAL_TABLET | ORAL | 0 refills | Status: DC
  Filled 2020-10-27: qty 2, 14d supply, fill #0

## 2020-11-20 ENCOUNTER — Other Ambulatory Visit (HOSPITAL_COMMUNITY): Payer: Self-pay

## 2020-11-20 MED ORDER — OXYCODONE-ACETAMINOPHEN 5-325 MG PO TABS
ORAL_TABLET | ORAL | 0 refills | Status: DC
  Filled 2020-11-20: qty 28, 7d supply, fill #0

## 2020-11-25 ENCOUNTER — Other Ambulatory Visit (HOSPITAL_COMMUNITY): Payer: Self-pay

## 2020-11-25 MED FILL — Triamcinolone Acetonide Cream 0.1%: CUTANEOUS | 34 days supply | Qty: 454 | Fill #0 | Status: CN

## 2020-11-25 MED FILL — Budesonide-Formoterol Fumarate Dihyd Aerosol 160-4.5 MCG/ACT: RESPIRATORY_TRACT | 30 days supply | Qty: 10.2 | Fill #0 | Status: CN

## 2020-11-27 ENCOUNTER — Other Ambulatory Visit (HOSPITAL_COMMUNITY): Payer: Self-pay

## 2020-11-28 ENCOUNTER — Other Ambulatory Visit (HOSPITAL_COMMUNITY): Payer: Self-pay

## 2020-12-01 ENCOUNTER — Other Ambulatory Visit (HOSPITAL_COMMUNITY): Payer: Self-pay

## 2021-01-08 ENCOUNTER — Other Ambulatory Visit (HOSPITAL_BASED_OUTPATIENT_CLINIC_OR_DEPARTMENT_OTHER): Payer: Self-pay

## 2021-03-10 ENCOUNTER — Ambulatory Visit
Admission: EM | Admit: 2021-03-10 | Discharge: 2021-03-10 | Disposition: A | Discharge status: Home / Self Care | Payer: Medicaid Other | Attending: Physician Assistant | Admitting: Physician Assistant

## 2021-03-10 ENCOUNTER — Other Ambulatory Visit: Payer: Self-pay

## 2021-03-10 DIAGNOSIS — B349 Viral infection, unspecified: Secondary | ICD-10-CM

## 2021-03-10 LAB — POCT INFLUENZA A/B
Influenza A, POC: NEGATIVE
Influenza B, POC: NEGATIVE

## 2021-03-10 MED ORDER — PROMETHAZINE HCL 25 MG PO TABS: 25.0000 mg | ORAL_TABLET | Freq: Four times a day (QID) | ORAL | 0 refills | Status: DC | PRN

## 2021-03-10 NOTE — ED Triage Notes (Signed)
Onset yesterday of diarrhea, nausea, abdominal pain and HA with an onset this morning of body aches and epistaxis x1. Has been taking DayQuil, Nyquil, tylenol and ibuprofen without relief. Denies emesis. Pt is covid vaccinated.

## 2021-03-10 NOTE — ED Provider Notes (Signed)
[facility]    CSN: [id number] Arrival date & time: 03/10/21  1005      History   Chief Complaint Chief Complaint  Patient presents with   Diarrhea   Nausea    HPI Laurie Reed is a 35 y.o. female.   Patient here today for evaluation of nasal congestion, nausea, and diarrhea that is been ongoing for the last few days.  She reports she has had some headache as well.  She does not report any treatment for symptoms.  Several people at her work have been sick recently.  The history is provided by the patient.  Diarrhea Associated symptoms: no abdominal pain, no chills, no fever and no vomiting    Past Medical History:  Diagnosis Date   Acid reflux 2004   Anemia    Anxiety 2008   Asthma 1993   no previous intubations or hospitalizations    Carpal tunnel syndrome    Chlamydia    Depression 2008   no meds, currenly ok   Environmental allergies    GERD (gastroesophageal reflux disease)    Migraines    Miscarriage    Ovarian cyst    Pre-diabetes    Ulcer of the stomach and intestine 2004   Urinary tract infection     Patient Active Problem List   Diagnosis Date Noted   Chronic low back pain 12/26/2019   Anemia in pregnancy 01/23/2018   Morbid obesity with BMI of 45.0-49.9, adult (HCC) 10/19/2017   Seasonal allergic rhinitis 11/01/2016   Asthma 06/30/2015   Vitamin D deficiency 01/22/2015   Anxiety and depression 01/21/2015    Past Surgical History:  Procedure Laterality Date   CARPAL TUNNEL RELEASE Left    COLONOSCOPY  2004   IRRIGATION AND DEBRIDEMENT SEBACEOUS CYST N/A 06/01/2017   Procedure: EXCISION SEBACEOUS CYST ON SCALP;  Surgeon: Ancil Linsey, MD;  Location: [facility];  Service: General;  Laterality: N/A;   MULTIPLE TOOTH EXTRACTIONS  2015   6 teeth removed    TUBAL LIGATION N/A 04/04/2018   Procedure: POST PARTUM TUBAL LIGATION;  Surgeon: Levie Heritage, DO;  Location: [facility];  Service: Gynecology;  Laterality:  N/A;   WISDOM TOOTH EXTRACTION  2007    OB History     Gravida  7   Para  4   Term  4   Preterm  0   AB  3   Living  4      SAB  3   IAB  0   Ectopic  0   Multiple  0   Live Births  4            Home Medications    Prior to Admission medications   Medication Sig Start Date End Date Taking? Authorizing Provider  promethazine (PHENERGAN) 25 MG tablet Take 1 tablet (25 mg total) by mouth every 6 (six) hours as needed for nausea or vomiting. 03/10/21  Yes Tomi Bamberger, PA-C  acetaminophen (TYLENOL) 500 MG tablet Take 500-1,000 mg by mouth every 6 (six) hours as needed for mild pain, moderate pain or headache (or migraines).     [provider]  albuterol (PROVENTIL) (2.5 MG/3ML) 0.083% nebulizer solution Take 3 mLs (2.5 mg total) by nebulization every 6 (six) hours as needed for wheezing or shortness of breath. 10/14/19   Moshe Cipro, NP  albuterol (PROVENTIL) (2.5 MG/3ML) 0.083% nebulizer solution USE 1 VIAL EVERY 6 HOURS AND AS NEEDED DIAGNOSIS: ASTHMA: J44.9 03/12/20 03/12/21  Fleet Contras, MD  albuterol (VENTOLIN HFA) 108 (90 Base) MCG/ACT inhaler Inhale 1-2 puffs into the lungs every 6 (six) hours as needed for wheezing or shortness of breath.    [provider]  albuterol (VENTOLIN HFA) 108 (90 Base) MCG/ACT inhaler INHALE 2 PUFFS INTO THE LUNGS EVERY 4 (FOUR) HOURS AS NEEDED FOR WHEEZING OR SHORTNESS OF BREATH. 05/12/20 05/12/21  Evern Core, PA-C  albuterol (VENTOLIN HFA) 108 (90 Base) MCG/ACT inhaler Inhale 2 puffs into the lungs four times a day as needed 09/12/20     cetirizine (ZYRTEC) 10 MG tablet Take 1 tablet (10 mg total) by mouth daily. Prn itching 01/02/20   Anders Simmonds, PA-C  cetirizine (ZYRTEC) 10 MG tablet TAKE 1 TABLET BY MOUTH EVERY EVENING AS NEEDED FOR ALLERGIES 06/06/20 06/06/21  Fleet Contras, MD  cetirizine (ZYRTEC) 10 MG tablet Take 1 tablet by mouth every evening as needed for allergies 09/12/20      Chlorphen-PE-Acetaminophen 4-10-325 MG TABS TAKE 1 TABLET BY MOUTH TWICE DAILY AS NEEDED FOR COUGH AND CONGESTION 05/23/20 05/23/21  Fleet Contras, MD  ciprofloxacin (CIPRO) 250 MG tablet TAKE 1 TABLET BY MOUTH EVERY 12 HOURS Patient not taking: Reported on 10/11/2020 07/16/20 07/16/21  Fleet Contras, MD  dextromethorphan-guaiFENesin (MUCINEX DM) 30-600 MG 12hr tablet Take 1 tablet by mouth 2 (two) times daily. 05/29/20   Eustace Moore, MD  diclofenac (VOLTAREN) 75 MG EC tablet TAKE 1 TABLET BY MOUTH 2 TIMES DAILY WITH FOOD AS NEEDED 05/23/20 05/23/21  Fleet Contras, MD  diphenhydrAMINE (BENADRYL) 25 mg capsule Take 25-50 mg by mouth 2 (two) times daily as needed for allergies.    [provider]  DULoxetine (CYMBALTA) 30 MG capsule Take 1 capsule (30 mg total) by mouth daily. 12/14/19 01/13/20  Claiborne Rigg, NP  escitalopram (LEXAPRO) 10 MG tablet Take 1 tablet (10 mg total) by mouth daily. Patient not taking: Reported on 10/05/2019 06/27/19   Cain Saupe, MD  ferrous sulfate 324 (65 Fe) MG TBEC 1 pill daily after meal to help with anemia 11/22/18   [name], [name], MD  fexofenadine (ALLEGRA) 180 MG tablet Take 180 mg by mouth daily as needed for allergies or rhinitis.     [provider]  fluconazole (DIFLUCAN) 150 MG tablet TAKE 1 TABLET BY MOUTH WEEKLY AS NEEDED. Patient not taking: Reported on 10/11/2020 07/16/20 07/16/21  Fleet Contras, MD  fluconazole (DIFLUCAN) 150 MG tablet TAKE 1 TABLET BY MOUTH ONCE WEEKLY AS NEEDED 04/23/20 04/23/21  Fleet Contras, MD  fluconazole (DIFLUCAN) 150 MG tablet Take 1 tablet by mouth every week as needed 10/27/20     Fluocinolone Acetonide Body 0.01 % OIL Apply as directed 09/12/20     fluticasone (FLONASE) 50 MCG/ACT nasal spray Place 1 spray into both nostrils daily.    [provider]  fluticasone (FLONASE) 50 MCG/ACT nasal spray Use 2 sprays in each nostril once daily 09/12/20     HYDROcodone-acetaminophen (NORCO/VICODIN) 5-325 MG tablet Take 1  tablet by mouth every 6 hours for 7 days. Patient not taking: Reported on 10/11/2020 08/28/20     HYDROcodone-acetaminophen (NORCO/VICODIN) 5-325 MG tablet Take 1 tablet every 6 hours by mouth for 7 days. Patient not taking: Reported on 10/11/2020 09/08/20     hydrocortisone 2.5 % cream APPLY 2 TIMES DAILY AS NEEDED FOR BUMPS 07/16/20 07/16/21  Fleet Contras, MD  hydrOXYzine (ATARAX/VISTARIL) 10 MG tablet Take 1 tablet (10 mg total) by mouth every 6 (six) hours as needed for itching. 12/16/19  Claiborne Rigg, NP  meloxicam (MOBIC) 15 MG tablet Take 1 tablet (15 mg total) by mouth daily. Patient not taking: Reported on 10/11/2020 01/02/20   Anders Simmonds, PA-C  meloxicam (MOBIC) 15 MG tablet TAKE 1 TABLET BY MOUTH ONCE A DAY WITH FOOD Patient not taking: Reported on 10/11/2020 04/23/20 04/23/21  Fleet Contras, MD  metroNIDAZOLE (FLAGYL) 500 MG tablet Take 1 tablet (500 mg total) by mouth 2 (two) times daily. 10/27/20     montelukast (SINGULAIR) 10 MG tablet Take 1 tablet (10 mg total) by mouth at bedtime. 10/14/19   Moshe Cipro, NP  ofloxacin (OCUFLOX) 0.3 % ophthalmic solution INSTILL 3 DROPS IN AFFECTED EAR ONCE DAILY Patient not taking: Reported on 10/11/2020 03/12/20 03/12/21  Fleet Contras, MD  omeprazole (PRILOSEC) 40 MG capsule TAKE 1 CAPSULE (40 MG TOTAL) BY MOUTH DAILY. TO REDUCE STOMACH ACID 03/21/20 03/21/21  Esterwood, Amy S, PA-C  oxyCODONE-acetaminophen (PERCOCET/ROXICET) 5-325 MG tablet Take 1 tablet by mouth every 6 hours for 7 days. 11/20/20     phentermine (ADIPEX-P) 37.5 MG tablet Take 37.5 mg by mouth at bedtime. 03/13/20   [provider]  phentermine (ADIPEX-P) 37.5 MG tablet TAKE 1 TABLET BY MOUTH EVERY MORNING 06/06/20 12/03/20  Fleet Contras, MD  phentermine (ADIPEX-P) 37.5 MG tablet TAKE ONE TABLET BY MOUTH EVERY MORNING 03/12/20 09/08/20  Fleet Contras, MD  phentermine (ADIPEX-P) 37.5 MG tablet Take 1 tablet by mouth every morning 09/12/20     predniSONE (DELTASONE)  20 MG tablet Take 1 tablet (20 mg total) by mouth 2 (two) times daily with a meal. Patient not taking: Reported on 10/11/2020 05/29/20   Eustace Moore, MD  predniSONE (DELTASONE) 20 MG tablet TAKE 1 TABLET BY MOUTH ONCE A DAY FOR 7 DAYS Patient not taking: Reported on 10/11/2020 06/06/20 06/06/21  Fleet Contras, MD  PROAIR HFA 108 [phone] Base) MCG/ACT inhaler INHALE 2 PUFFS INTO THE LUNGS 4 TIMES DAILY AS NEEDED 03/12/20 03/12/21  Fleet Contras, MD  SUMAtriptan (IMITREX) 100 MG tablet Take 1 tablet by mouth at onset of migraine symptoms, may repeat in 1-2 hours if persistent. 10/27/20     SUMAtriptan (IMITREX) 50 MG tablet Take 1 tablet (50 mg total) by mouth once as needed (at the onset of a migraine- may repeat once in 2 hours if headache persists or recurs). 11/22/18   Hoy Register, MD  SYMBICORT 160-4.5 MCG/ACT inhaler INHALE 2 PUFFS INTO THE LUNGS 2 TIMES DAILY 06/06/20 06/06/21  Fleet Contras, MD  tiZANidine (ZANAFLEX) 4 MG tablet TAKE 1 TABLET BY MOUTH 2 TIMES DAILY AS NEEDED 04/23/20 04/23/21  Fleet Contras, MD  tiZANidine (ZANAFLEX) 4 MG tablet Take 1 tablet by mouth twice daily as needed 09/12/20     topiramate (TOPAMAX) 100 MG tablet Take 1 tablet by mouth once a day 10/27/20     topiramate (TOPAMAX) 50 MG tablet Take 1 tablet (50 mg total) by mouth 2 (two) times daily. 11/22/18   Hoy Register, MD  triamcinolone cream (KENALOG) 0.1 % APPLY TO AFFECTED AREA 2 TIMES DAILY AS NEEDED 06/18/20 06/18/21  Fleet Contras, MD  triamcinolone (KENALOG) 0.1 % APPLY TOPICALLY 2 TIMES DAILY AS NEEDED 04/23/20 04/23/21  Fleet Contras, MD  dicyclomine (BENTYL) 10 MG capsule Take 1 capsule (10 mg total) by mouth 4 (four) times daily -  before meals and at bedtime. For stomach cramping Patient not taking: Reported on 10/25/2019 06/13/19 10/25/19  [name], [name], MD  sucralfate (CARAFATE) 1 GM/10ML suspension Take 10 mLs (1  g total) by mouth 4 (four) times daily -  with meals and at bedtime. Patient not taking: Reported  on 10/25/2019 06/13/19 10/25/19  Cain Saupe, MD    Family History Family History  Problem Relation Age of Onset   Asthma Mother    Heart disease Mother    Hypertension Mother    Clotting disorder Mother    Diabetes Mother    Asthma Maternal Aunt    Hypertension Maternal Aunt    Diabetes Maternal Aunt    Asthma Maternal Uncle    Hypertension Maternal Uncle    Diabetes Maternal Uncle    Heart disease Maternal Grandmother    Diabetes Maternal Grandmother    Hypertension Maternal Grandmother    Heart disease Maternal Grandfather    Diabetes Maternal Grandfather    Hypertension Maternal Grandfather    Hypertension Paternal Grandmother    Diabetes Maternal Uncle    Heart disease Maternal Uncle    Diabetes Maternal Uncle    Cancer Paternal Aunt    Hypotension Neg Hx    Anesthesia problems Neg Hx    Malignant hyperthermia Neg Hx    Pseudochol deficiency Neg Hx     Social History Social History   Tobacco Use   Smoking status: Never   Smokeless tobacco: Never  Vaping Use   Vaping Use: Never used  Substance Use Topics   Alcohol use: No    Alcohol/week: 0.0 standard drinks   Drug use: No     Allergies   Latex, Other, Ondansetron, Tree extract, and Zofran   Review of Systems Review of Systems  Constitutional:  Negative for chills and fever.  HENT:  Positive for congestion and sinus pressure. Negative for ear pain and sore throat.   Eyes:  Negative for discharge and redness.  Respiratory:  Positive for cough. Negative for shortness of breath and wheezing.   Gastrointestinal:  Positive for diarrhea and nausea. Negative for abdominal pain and vomiting.    Physical Exam Triage Vital Signs ED Triage Vitals  Enc Vitals Group     BP 03/10/21 1141 104/67     Pulse Rate 03/10/21 1141 75     Resp 03/10/21 1141 18     Temp 03/10/21 1141 98 F (36.7 C)     Temp Source 03/10/21 1141 Oral     SpO2 03/10/21 1141 99 %     Weight --      Height --      Head Circumference  --      Peak Flow --      Pain Score 03/10/21 1144 9     Pain Loc --      Pain Edu? --      Excl. in GC? --    No data found.  Updated Vital Signs BP 104/67 (BP Location: Left Arm)   Pulse 75   Temp 98 F (36.7 C) (Oral)   Resp 18   LMP 02/13/2021 (Exact Date)   SpO2 99%      Physical Exam Vitals and nursing note reviewed.  Constitutional:      General: She is not in acute distress.    Appearance: Normal appearance. She is not ill-appearing.  HENT:     Head: Normocephalic and atraumatic.     Nose: Congestion present.     Mouth/Throat:     Mouth: Mucous membranes are moist.     Pharynx: No oropharyngeal exudate or posterior oropharyngeal erythema.  Eyes:     Conjunctiva/sclera: Conjunctivae normal.  Cardiovascular:  Rate and Rhythm: Normal rate and regular rhythm.     Heart sounds: Normal heart sounds. No murmur heard. Pulmonary:     Effort: Pulmonary effort is normal. No respiratory distress.     Breath sounds: Normal breath sounds. No wheezing, rhonchi or rales.  Skin:    General: Skin is warm and dry.  Neurological:     Mental Status: She is alert.  Psychiatric:        Mood and Affect: Mood normal.        Thought Content: Thought content normal.     UC Treatments / Results  Labs (all labs ordered are listed, but only abnormal results are displayed) Labs Reviewed  NOVEL CORONAVIRUS, NAA  POCT INFLUENZA A/B    EKG   Radiology No results found.  Procedures Procedures (including critical care time)  Medications Ordered in UC Medications - No data to display  Initial Impression / Assessment and Plan / UC Course  I have reviewed the triage vital signs and the nursing notes.  Pertinent labs & imaging results that were available during my care of the patient were reviewed by me and considered in my medical decision making (see chart for details).  Flu test negative.  Will order screening for COVID and discussed likely viral etiology of symptoms.   Promethazine prescribed for nausea.  Encouraged follow-up if symptoms fail to improve or worsen anyway.  Final Clinical Impressions(s) / UC Diagnoses   Final diagnoses:  Acute viral syndrome   Discharge Instructions   None    ED Prescriptions     Medication Sig Dispense Auth. Provider   promethazine (PHENERGAN) 25 MG tablet Take 1 tablet (25 mg total) by mouth every 6 (six) hours as needed for nausea or vomiting. 30 tablet Tomi Bamberger, PA-C      PDMP not reviewed this encounter.   Tomi Bamberger, PA-C 03/10/21 1320

## 2021-03-11 LAB — NOVEL CORONAVIRUS, NAA: SARS-CoV-2, NAA: NOT DETECTED

## 2021-03-11 LAB — SARS-COV-2, NAA 2 DAY TAT

## 2021-04-08 ENCOUNTER — Other Ambulatory Visit (HOSPITAL_COMMUNITY): Payer: Self-pay

## 2021-04-08 MED ORDER — ALBUTEROL SULFATE HFA 108 (90 BASE) MCG/ACT IN AERS
2.0000 | INHALATION_SPRAY | Freq: Four times a day (QID) | RESPIRATORY_TRACT | 5 refills | Status: DC | PRN
  Filled 2021-04-08: qty 18, 25d supply, fill #0
  Filled 2021-05-13: qty 18, 25d supply, fill #1

## 2021-04-08 MED FILL — Triamcinolone Acetonide Cream 0.1%: CUTANEOUS | 30 days supply | Qty: 454 | Fill #0 | Status: AC

## 2021-04-08 MED FILL — Budesonide-Formoterol Fumarate Dihyd Aerosol 160-4.5 MCG/ACT: RESPIRATORY_TRACT | 30 days supply | Qty: 10.2 | Fill #0 | Status: AC

## 2021-04-08 MED FILL — Hydrocortisone Cream 2.5%: CUTANEOUS | 15 days supply | Qty: 30 | Fill #0 | Status: AC

## 2021-04-13 ENCOUNTER — Ambulatory Visit (INDEPENDENT_AMBULATORY_CARE_PROVIDER_SITE_OTHER): Payer: BLUE CROSS/BLUE SHIELD | Admitting: Obstetrics & Gynecology

## 2021-04-13 ENCOUNTER — Other Ambulatory Visit (HOSPITAL_COMMUNITY)
Admission: RE | Admit: 2021-04-13 | Discharge: 2021-04-13 | Disposition: A | Discharge status: Home / Self Care | Payer: BLUE CROSS/BLUE SHIELD | Source: Ambulatory Visit | Attending: Obstetrics & Gynecology | Admitting: Obstetrics & Gynecology

## 2021-04-13 ENCOUNTER — Other Ambulatory Visit: Payer: Self-pay

## 2021-04-13 ENCOUNTER — Encounter: Payer: Self-pay | Admitting: Obstetrics & Gynecology

## 2021-04-13 VITALS — BP 115/53 | HR 81 | Wt 260.5 lb

## 2021-04-13 DIAGNOSIS — R1013 Epigastric pain: Secondary | ICD-10-CM

## 2021-04-13 NOTE — Progress Notes (Signed)
Patient reports pain in stomach. She stated that the pain stated after getting tubal done 3 years ago. Ms. Laurie Reed describes the pain as "sharp" and similar to labor contractions. The pain would increase while she is on her menstrual cycle.

## 2021-04-13 NOTE — Progress Notes (Signed)
Patient ID: Laurie Reed, female   DOB: [birth year], 35 y.o.   MRN: [id number]  Chief Complaint  Patient presents with   Gynecologic Exam  Abdominal pain for 3-4 years  HPI Laurie Reed is a 35 y.o. female.  [id number] Patient's last menstrual period was 03/20/2021 (exact date). Patient had PP BTL with filshie clips 2019 and she reports epigastric pain ever since which is worse during menses HPI  Past Medical History:  Diagnosis Date   Acid reflux 2004   Anemia    Anxiety 2008   Asthma 1993   no previous intubations or hospitalizations    Carpal tunnel syndrome    Chlamydia    Depression 2008   no meds, currenly ok   Environmental allergies    GERD (gastroesophageal reflux disease)    Migraines    Miscarriage    Ovarian cyst    Pre-diabetes    Ulcer of the stomach and intestine 2004   Urinary tract infection     Past Surgical History:  Procedure Laterality Date   CARPAL TUNNEL RELEASE Left    COLONOSCOPY  2004   IRRIGATION AND DEBRIDEMENT SEBACEOUS CYST N/A 06/01/2017   Procedure: EXCISION SEBACEOUS CYST ON SCALP;  Surgeon: Ancil Linsey, MD;  Location: [facility];  Service: General;  Laterality: N/A;   MULTIPLE TOOTH EXTRACTIONS  2015   6 teeth removed    TUBAL LIGATION N/A 04/04/2018   Procedure: POST PARTUM TUBAL LIGATION;  Surgeon: Levie Heritage, DO;  Location: [facility];  Service: Gynecology;  Laterality: N/A;   WISDOM TOOTH EXTRACTION  2007    Family History  Problem Relation Age of Onset   Asthma Mother    Heart disease Mother    Hypertension Mother    Clotting disorder Mother    Diabetes Mother    Asthma Maternal Aunt    Hypertension Maternal Aunt    Diabetes Maternal Aunt    Asthma Maternal Uncle    Hypertension Maternal Uncle    Diabetes Maternal Uncle    Heart disease Maternal Grandmother    Diabetes Maternal Grandmother    Hypertension Maternal Grandmother    Heart disease Maternal Grandfather    Diabetes Maternal  Grandfather    Hypertension Maternal Grandfather    Hypertension Paternal Grandmother    Diabetes Maternal Uncle    Heart disease Maternal Uncle    Diabetes Maternal Uncle    Cancer Paternal Aunt    Hypotension Neg Hx    Anesthesia problems Neg Hx    Malignant hyperthermia Neg Hx    Pseudochol deficiency Neg Hx     Social History Social History   Tobacco Use   Smoking status: Never   Smokeless tobacco: Never  Vaping Use   Vaping Use: Never used  Substance Use Topics   Alcohol use: No    Alcohol/week: 0.0 standard drinks   Drug use: No    Allergies  Allergen Reactions   Latex Hives and Itching   Other Itching    "20 different types of trees"   Ondansetron Itching   Tree Extract Itching   Zofran Itching    Current Outpatient Medications  Medication Sig Dispense Refill   acetaminophen (TYLENOL) 500 MG tablet Take 500-1,000 mg by mouth every 6 (six) hours as needed for mild pain, moderate pain or headache (or migraines).      albuterol (PROVENTIL) (2.5 MG/3ML) 0.083% nebulizer solution Take 3 mLs (2.5 mg total) by nebulization every 6 (six) hours as needed for wheezing or  shortness of breath. 75 mL 12   cetirizine (ZYRTEC) 10 MG tablet Take 1 tablet (10 mg total) by mouth daily. Prn itching 30 tablet 11   Chlorphen-PE-Acetaminophen 4-10-325 MG TABS TAKE 1 TABLET BY MOUTH TWICE DAILY AS NEEDED FOR COUGH AND CONGESTION 20 tablet 2   diclofenac (VOLTAREN) 75 MG EC tablet TAKE 1 TABLET BY MOUTH 2 TIMES DAILY WITH FOOD AS NEEDED 60 tablet 2   diphenhydrAMINE (BENADRYL) 25 mg capsule Take 25-50 mg by mouth 2 (two) times daily as needed for allergies.     hydrOXYzine (ATARAX/VISTARIL) 10 MG tablet Take 1 tablet (10 mg total) by mouth every 6 (six) hours as needed for itching. 60 tablet 1   phentermine (ADIPEX-P) 37.5 MG tablet Take 1 tablet by mouth every morning 30 tablet 0   promethazine (PHENERGAN) 25 MG tablet Take 1 tablet (25 mg total) by mouth every 6 (six) hours as needed  for nausea or vomiting. 30 tablet 0   SUMAtriptan (IMITREX) 100 MG tablet Take 1 tablet by mouth at onset of migraine symptoms, may repeat in 1-2 hours if persistent. 15 tablet 2   SYMBICORT 160-4.5 MCG/ACT inhaler INHALE 2 PUFFS INTO THE LUNGS 2 TIMES DAILY 10.2 g 5   tiZANidine (ZANAFLEX) 4 MG tablet TAKE 1 TABLET BY MOUTH 2 TIMES DAILY AS NEEDED 60 tablet 5   tiZANidine (ZANAFLEX) 4 MG tablet Take 1 tablet by mouth twice daily as needed 60 tablet 5   topiramate (TOPAMAX) 100 MG tablet Take 1 tablet by mouth once a day 30 tablet 5   topiramate (TOPAMAX) 50 MG tablet Take 1 tablet (50 mg total) by mouth 2 (two) times daily. 60 tablet 3   triamcinolone (KENALOG) 0.1 % APPLY TOPICALLY 2 TIMES DAILY AS NEEDED 454 g 2   triamcinolone cream (KENALOG) 0.1 % APPLY TO AFFECTED AREA 2 TIMES DAILY AS NEEDED 454 g 2   albuterol (VENTOLIN HFA) 108 (90 Base) MCG/ACT inhaler Inhale 2 puffs into the lungs 4 (four) times daily as needed. 18 g 5   albuterol (PROVENTIL) (2.5 MG/3ML) 0.083% nebulizer solution USE 1 VIAL EVERY 6 HOURS AND AS NEEDED DIAGNOSIS: ASTHMA: J44.9 150 mL 11   albuterol (VENTOLIN HFA) 108 (90 Base) MCG/ACT inhaler Inhale 1-2 puffs into the lungs every 6 (six) hours as needed for wheezing or shortness of breath. (Patient not taking: Reported on 04/13/2021)     albuterol (VENTOLIN HFA) 108 (90 Base) MCG/ACT inhaler INHALE 2 PUFFS INTO THE LUNGS EVERY 4 (FOUR) HOURS AS NEEDED FOR WHEEZING OR SHORTNESS OF BREATH. 8.5 g 0   cetirizine (ZYRTEC) 10 MG tablet TAKE 1 TABLET BY MOUTH EVERY EVENING AS NEEDED FOR ALLERGIES 30 tablet 2   cetirizine (ZYRTEC) 10 MG tablet Take 1 tablet by mouth every evening as needed for allergies 30 tablet 2   ciprofloxacin (CIPRO) 250 MG tablet TAKE 1 TABLET BY MOUTH EVERY 12 HOURS (Patient not taking: Reported on 10/11/2020) 14 tablet 0   dextromethorphan-guaiFENesin (MUCINEX DM) 30-600 MG 12hr tablet Take 1 tablet by mouth 2 (two) times daily. (Patient not taking:  Reported on 04/13/2021) 20 tablet 0   DULoxetine (CYMBALTA) 30 MG capsule Take 1 capsule (30 mg total) by mouth daily. 30 capsule 1   escitalopram (LEXAPRO) 10 MG tablet Take 1 tablet (10 mg total) by mouth daily. (Patient not taking: Reported on 10/05/2019) 30 tablet 1   ferrous sulfate 324 (65 Fe) MG TBEC 1 pill daily after meal to help with anemia 30 tablet 3  fexofenadine (ALLEGRA) 180 MG tablet Take 180 mg by mouth daily as needed for allergies or rhinitis.      fluconazole (DIFLUCAN) 150 MG tablet TAKE 1 TABLET BY MOUTH WEEKLY AS NEEDED. (Patient not taking: Reported on 10/11/2020) 2 tablet 0   fluconazole (DIFLUCAN) 150 MG tablet TAKE 1 TABLET BY MOUTH ONCE WEEKLY AS NEEDED 2 tablet 0   fluconazole (DIFLUCAN) 150 MG tablet Take 1 tablet by mouth every week as needed 2 tablet 0   Fluocinolone Acetonide Body 0.01 % OIL Apply as directed 118.28 mL 2   fluticasone (FLONASE) 50 MCG/ACT nasal spray Place 1 spray into both nostrils daily.     fluticasone (FLONASE) 50 MCG/ACT nasal spray Use 2 sprays in each nostril once daily 16 g 5   HYDROcodone-acetaminophen (NORCO/VICODIN) 5-325 MG tablet Take 1 tablet by mouth every 6 hours for 7 days. (Patient not taking: Reported on 10/11/2020) 28 tablet 0   HYDROcodone-acetaminophen (NORCO/VICODIN) 5-325 MG tablet Take 1 tablet every 6 hours by mouth for 7 days. (Patient not taking: Reported on 10/11/2020) 28 tablet 0   hydrocortisone 2.5 % cream APPLY 2 TIMES DAILY AS NEEDED FOR BUMPS (Patient not taking: Reported on 04/13/2021) 30 g 2   meloxicam (MOBIC) 15 MG tablet Take 1 tablet (15 mg total) by mouth daily. (Patient not taking: Reported on 10/11/2020) 10 tablet 0   meloxicam (MOBIC) 15 MG tablet TAKE 1 TABLET BY MOUTH ONCE A DAY WITH FOOD (Patient not taking: Reported on 10/11/2020) 30 tablet 5   metroNIDAZOLE (FLAGYL) 500 MG tablet Take 1 tablet (500 mg total) by mouth 2 (two) times daily. (Patient not taking: Reported on 04/13/2021) 14 tablet 0    montelukast (SINGULAIR) 10 MG tablet Take 1 tablet (10 mg total) by mouth at bedtime. (Patient not taking: Reported on 04/13/2021) 30 tablet 1   omeprazole (PRILOSEC) 40 MG capsule TAKE 1 CAPSULE (40 MG TOTAL) BY MOUTH DAILY. TO REDUCE STOMACH ACID 30 capsule 11   oxyCODONE-acetaminophen (PERCOCET/ROXICET) 5-325 MG tablet Take 1 tablet by mouth every 6 hours for 7 days. (Patient not taking: Reported on 04/13/2021) 28 tablet 0   phentermine (ADIPEX-P) 37.5 MG tablet Take 37.5 mg by mouth at bedtime. (Patient not taking: Reported on 04/13/2021)     phentermine (ADIPEX-P) 37.5 MG tablet TAKE 1 TABLET BY MOUTH EVERY MORNING 30 tablet 2   phentermine (ADIPEX-P) 37.5 MG tablet TAKE ONE TABLET BY MOUTH EVERY MORNING 30 tablet 2   predniSONE (DELTASONE) 20 MG tablet Take 1 tablet (20 mg total) by mouth 2 (two) times daily with a meal. (Patient not taking: Reported on 04/13/2021) 10 tablet 0   predniSONE (DELTASONE) 20 MG tablet TAKE 1 TABLET BY MOUTH ONCE A DAY FOR 7 DAYS (Patient not taking: Reported on 10/11/2020) 7 tablet 0   PROAIR HFA 108 (90 Base) MCG/ACT inhaler INHALE 2 PUFFS INTO THE LUNGS 4 TIMES DAILY AS NEEDED 8.5 g 5   SUMAtriptan (IMITREX) 50 MG tablet Take 1 tablet (50 mg total) by mouth once as needed (at the onset of a migraine- may repeat once in 2 hours if headache persists or recurs). (Patient not taking: Reported on 04/13/2021) 10 tablet 1   No current facility-administered medications for this visit.    Review of Systems Review of Systems  Constitutional: Negative.   Respiratory: Negative.    Gastrointestinal:  Positive for abdominal pain.  Genitourinary:  Negative for vaginal bleeding and vaginal discharge.  Skin: Negative.   Psychiatric/Behavioral: Negative.  Blood pressure (!) 115/53, pulse 81, weight 260 lb 8 oz (118.2 kg), last menstrual period 03/20/2021.  Physical Exam Physical Exam Vitals and nursing note reviewed. Exam conducted with a chaperone present.   Constitutional:      Appearance: She is obese.  Cardiovascular:     Rate and Rhythm: Normal rate.  Pulmonary:     Effort: Pulmonary effort is normal.  Abdominal:     Palpations: Abdomen is soft. There is no mass.     Tenderness: There is abdominal tenderness (mild).  Genitourinary:    General: Normal vulva.     Vagina: Normal.     Cervix: Cervical motion tenderness (minimal) present.     Uterus: Normal.      Adnexa: Right adnexa normal and left adnexa normal.  Skin:    General: Skin is warm.  Neurological:     Mental Status: She is alert.  Psychiatric:        Mood and Affect: Mood normal.        Behavior: Behavior normal.    Data Reviewed radiology  Assessment Epigastric pain - Plan: Cervicovaginal ancillary only( [place]), Hepatitis B Surface AntiGEN, HIV Antibody (routine testing w rflx), RPR, CT ABDOMEN PELVIS W CONTRAST   Plan Orders Placed This Encounter  Procedures   CT ABDOMEN PELVIS W CONTRAST    Standing Status:   Future    Standing Expiration Date:   04/13/2022    Order Specific Question:   If indicated for the ordered procedure, I authorize the administration of contrast media per Radiology protocol    Answer:   Yes    Order Specific Question:   Is patient pregnant?    Answer:   No    Order Specific Question:   Preferred imaging location?    Answer:   [id number]    Order Specific Question:   Release to patient    Answer:   Immediate    Order Specific Question:   Is Oral Contrast requested for this exam?    Answer:   Yes, Per Radiology protocol   Hepatitis B Surface AntiGEN   HIV Antibody (routine testing w rflx)   RPR   F/u after testing    Scheryl Darter 04/13/2021, 3:06 PM

## 2021-04-14 LAB — RPR: RPR Ser Ql: NONREACTIVE

## 2021-04-14 LAB — HEPATITIS B SURFACE ANTIGEN: Hepatitis B Surface Ag: NEGATIVE

## 2021-04-14 LAB — HIV ANTIBODY (ROUTINE TESTING W REFLEX): HIV Screen 4th Generation wRfx: NONREACTIVE

## 2021-04-15 LAB — CERVICOVAGINAL ANCILLARY ONLY
Chlamydia: NEGATIVE
Comment: NEGATIVE
Comment: NEGATIVE
Comment: NORMAL
Neisseria Gonorrhea: NEGATIVE
Trichomonas: NEGATIVE

## 2021-04-24 ENCOUNTER — Ambulatory Visit (HOSPITAL_COMMUNITY): Payer: BLUE CROSS/BLUE SHIELD

## 2021-04-28 NOTE — Progress Notes (Unsigned)
CT scan ordered 04/13/21 needs PA for [facility] [place] [employer]. Requested records faxed.

## 2021-04-30 DIAGNOSIS — M65839 Other synovitis and tenosynovitis, unspecified forearm: Secondary | ICD-10-CM | POA: Insufficient documentation

## 2021-04-30 DIAGNOSIS — M65939 Unspecified synovitis and tenosynovitis, unspecified forearm: Secondary | ICD-10-CM | POA: Insufficient documentation

## 2021-05-13 ENCOUNTER — Other Ambulatory Visit (HOSPITAL_COMMUNITY): Payer: Self-pay

## 2021-05-13 MED FILL — Budesonide-Formoterol Fumarate Dihyd Aerosol 160-4.5 MCG/ACT: RESPIRATORY_TRACT | 30 days supply | Qty: 10.2 | Fill #1 | Status: AC

## 2021-05-13 MED FILL — Triamcinolone Acetonide Cream 0.1%: CUTANEOUS | 30 days supply | Qty: 454 | Fill #1 | Status: AC

## 2021-05-14 ENCOUNTER — Other Ambulatory Visit (HOSPITAL_COMMUNITY): Payer: Self-pay

## 2021-05-15 ENCOUNTER — Other Ambulatory Visit (HOSPITAL_COMMUNITY): Payer: Self-pay

## 2021-05-15 MED ORDER — ALBUTEROL SULFATE HFA 108 (90 BASE) MCG/ACT IN AERS
2.0000 | INHALATION_SPRAY | Freq: Four times a day (QID) | RESPIRATORY_TRACT | 5 refills | Status: DC | PRN
  Filled 2021-08-14: qty 18, 25d supply, fill #0

## 2021-05-15 MED ORDER — HYDROCORTISONE 2.5 % EX CREA
TOPICAL_CREAM | CUTANEOUS | 2 refills | Status: DC
  Filled 2021-05-15: qty 30, 15d supply, fill #0
  Filled 2022-04-02: qty 30, 30d supply, fill #1

## 2021-05-15 MED ORDER — ALBUTEROL SULFATE (2.5 MG/3ML) 0.083% IN NEBU
2.5000 mg | INHALATION_SOLUTION | Freq: Four times a day (QID) | RESPIRATORY_TRACT | 11 refills | Status: AC
  Filled 2021-05-15: qty 150, 13d supply, fill #0
  Filled 2021-08-14: qty 180, 30d supply, fill #1

## 2021-05-18 ENCOUNTER — Other Ambulatory Visit (HOSPITAL_COMMUNITY): Payer: Self-pay

## 2021-05-20 ENCOUNTER — Other Ambulatory Visit (HOSPITAL_COMMUNITY): Payer: Self-pay

## 2021-06-04 ENCOUNTER — Other Ambulatory Visit: Payer: Self-pay

## 2021-06-04 ENCOUNTER — Telehealth: Payer: Self-pay

## 2021-06-04 ENCOUNTER — Ambulatory Visit: Payer: Medicaid Other | Admitting: Obstetrics & Gynecology

## 2021-06-04 DIAGNOSIS — R1013 Epigastric pain: Secondary | ICD-10-CM

## 2021-06-04 NOTE — Telephone Encounter (Signed)
Called Pt to advise of CT snan being rescheduled to 06/11/21 at 5:30p. For her to arrive at 5p, no food nor drink 4 hrs before visit. Also advised Pt to pick up Contrast B4 Thursday & to take the 1st bottle at 3:30p and the 2nd bottle at 4:30p. Advised also that these instructions will also be in her My Chart, Pt verbalized understanding.

## 2021-06-04 NOTE — Progress Notes (Unsigned)
Sent message to Oleh Genin to get Pt's CT scan on 06/11/21 at 5:30 p Pre-authorized.

## 2021-06-11 ENCOUNTER — Ambulatory Visit (HOSPITAL_COMMUNITY): Payer: BLUE CROSS/BLUE SHIELD

## 2021-06-12 ENCOUNTER — Ambulatory Visit (HOSPITAL_COMMUNITY): Payer: BLUE CROSS/BLUE SHIELD

## 2021-06-15 ENCOUNTER — Encounter: Payer: Self-pay | Admitting: Obstetrics & Gynecology

## 2021-06-17 ENCOUNTER — Ambulatory Visit (HOSPITAL_COMMUNITY): Payer: BLUE CROSS/BLUE SHIELD

## 2021-07-07 ENCOUNTER — Other Ambulatory Visit (HOSPITAL_COMMUNITY): Payer: Self-pay

## 2021-07-07 MED ORDER — FLUCONAZOLE 150 MG PO TABS
150.0000 mg | ORAL_TABLET | ORAL | 0 refills | Status: DC
  Filled 2021-07-07: qty 2, 14d supply, fill #0

## 2021-07-10 ENCOUNTER — Other Ambulatory Visit (HOSPITAL_COMMUNITY): Payer: Self-pay

## 2021-07-13 ENCOUNTER — Other Ambulatory Visit (HOSPITAL_COMMUNITY): Payer: Self-pay

## 2021-07-13 MED ORDER — TRIAMCINOLONE ACETONIDE 0.1 % EX CREA
1.0000 "application " | TOPICAL_CREAM | Freq: Two times a day (BID) | CUTANEOUS | 2 refills | Status: DC | PRN
  Filled 2021-07-13: qty 454, 30d supply, fill #0
  Filled 2021-10-27 – 2021-11-06 (×2): qty 454, 30d supply, fill #1
  Filled 2021-12-22: qty 454, 30d supply, fill #2

## 2021-07-13 MED ORDER — TRIAMCINOLONE ACETONIDE 0.1 % EX CREA
1.0000 "application " | TOPICAL_CREAM | Freq: Two times a day (BID) | CUTANEOUS | 2 refills | Status: DC | PRN
  Filled 2021-08-14: qty 454, 30d supply, fill #0
  Filled 2021-09-21 – 2021-10-06 (×2): qty 454, 30d supply, fill #1

## 2021-07-20 ENCOUNTER — Other Ambulatory Visit (HOSPITAL_COMMUNITY): Payer: Self-pay

## 2021-07-20 MED ORDER — PHENTERMINE HCL 37.5 MG PO TABS
37.5000 mg | ORAL_TABLET | Freq: Every morning | ORAL | 2 refills | Status: DC
  Filled 2021-07-20: qty 30, 30d supply, fill #0
  Filled 2021-08-20: qty 30, 30d supply, fill #1

## 2021-07-21 ENCOUNTER — Other Ambulatory Visit (HOSPITAL_COMMUNITY): Payer: Self-pay

## 2021-07-21 MED ORDER — VITAMIN D (ERGOCALCIFEROL) 1.25 MG (50000 UNIT) PO CAPS
50000.0000 [IU] | ORAL_CAPSULE | ORAL | 5 refills | Status: DC
  Filled 2021-07-21: qty 4, 28d supply, fill #0

## 2021-08-06 ENCOUNTER — Other Ambulatory Visit (HOSPITAL_COMMUNITY): Payer: Self-pay

## 2021-08-06 MED ORDER — OXYCODONE-ACETAMINOPHEN 5-325 MG PO TABS
1.0000 | ORAL_TABLET | Freq: Two times a day (BID) | ORAL | 0 refills | Status: DC | PRN
  Filled 2021-08-06: qty 14, 7d supply, fill #0

## 2021-08-13 ENCOUNTER — Other Ambulatory Visit (HOSPITAL_COMMUNITY): Payer: Self-pay

## 2021-08-13 MED ORDER — OXYCODONE-ACETAMINOPHEN 5-325 MG PO TABS
1.0000 | ORAL_TABLET | Freq: Two times a day (BID) | ORAL | 0 refills | Status: AC | PRN
  Filled 2021-08-13: qty 14, 7d supply, fill #0

## 2021-08-14 ENCOUNTER — Other Ambulatory Visit (HOSPITAL_COMMUNITY): Payer: Self-pay

## 2021-08-14 MED ORDER — SYMBICORT 160-4.5 MCG/ACT IN AERO
2.0000 | INHALATION_SPRAY | Freq: Two times a day (BID) | RESPIRATORY_TRACT | 5 refills | Status: DC
  Filled 2021-08-14: qty 10.2, 30d supply, fill #0
  Filled 2021-10-27: qty 10.2, 30d supply, fill #1

## 2021-08-20 ENCOUNTER — Other Ambulatory Visit (HOSPITAL_COMMUNITY): Payer: Self-pay

## 2021-08-28 ENCOUNTER — Other Ambulatory Visit (HOSPITAL_COMMUNITY): Payer: Self-pay

## 2021-09-07 ENCOUNTER — Ambulatory Visit (HOSPITAL_COMMUNITY)
Admission: EM | Admit: 2021-09-07 | Discharge: 2021-09-07 | Disposition: A | Discharge status: Home / Self Care | Payer: Medicaid Other | Attending: Emergency Medicine | Admitting: Emergency Medicine

## 2021-09-07 ENCOUNTER — Encounter (HOSPITAL_COMMUNITY): Payer: Self-pay | Admitting: Emergency Medicine

## 2021-09-07 DIAGNOSIS — G43011 Migraine without aura, intractable, with status migrainosus: Secondary | ICD-10-CM | POA: Diagnosis not present

## 2021-09-07 MED ORDER — SUMATRIPTAN SUCCINATE 6 MG/0.5ML ~~LOC~~ SOLN
6.0000 mg | Freq: Once | SUBCUTANEOUS | Status: AC
  Administered 2021-09-07: 6 mg via SUBCUTANEOUS

## 2021-09-07 MED ORDER — SUMATRIPTAN SUCCINATE 6 MG/0.5ML ~~LOC~~ SOLN
SUBCUTANEOUS | Status: AC
  Filled 2021-09-07: qty 0.5

## 2021-09-07 MED ORDER — METOCLOPRAMIDE HCL 5 MG/ML IJ SOLN
10.0000 mg | Freq: Once | INTRAMUSCULAR | Status: AC
  Administered 2021-09-07: 10 mg via INTRAMUSCULAR

## 2021-09-07 MED ORDER — KETOROLAC TROMETHAMINE 30 MG/ML IJ SOLN
30.0000 mg | Freq: Once | INTRAMUSCULAR | Status: AC
  Administered 2021-09-07: 30 mg via INTRAMUSCULAR

## 2021-09-07 MED ORDER — KETOROLAC TROMETHAMINE 30 MG/ML IJ SOLN
30.0000 mg | Freq: Once | INTRAMUSCULAR | Status: DC
Start: 2021-09-07 — End: 2021-09-07

## 2021-09-07 MED ORDER — METOCLOPRAMIDE HCL 5 MG/ML IJ SOLN
INTRAMUSCULAR | Status: AC
  Filled 2021-09-07: qty 2

## 2021-09-07 MED ORDER — KETOROLAC TROMETHAMINE 30 MG/ML IJ SOLN
INTRAMUSCULAR | Status: AC
  Filled 2021-09-07: qty 1

## 2021-09-07 MED ORDER — DEXAMETHASONE SODIUM PHOSPHATE 10 MG/ML IJ SOLN
INTRAMUSCULAR | Status: AC
  Filled 2021-09-07: qty 1

## 2021-09-07 MED ORDER — DEXAMETHASONE SODIUM PHOSPHATE 10 MG/ML IJ SOLN
10.0000 mg | Freq: Once | INTRAMUSCULAR | Status: AC
Start: 2021-09-07 — End: 2021-09-07
  Administered 2021-09-07: 10 mg via INTRAMUSCULAR

## 2021-09-07 NOTE — ED Triage Notes (Signed)
Pt c/o headache/migraine, congestion, nausea and dizziness for 3 days. Taking her migraine medication but not helping.  ?

## 2021-09-07 NOTE — ED Provider Notes (Signed)
?[facility] ? ? ? ?CSN: [id number] ?Arrival date & time: 09/07/21  1911 ? ? ?  ? ?History   ?Chief Complaint ?Chief Complaint  ?Patient presents with  ? Headache  ? ? ?HPI ?Laurie Reed is a 36 y.o. female.  ? ?Patient presents with left-sided frontal headache for 3 days.  Associated phonophobia, photophobia, dizziness, nausea without vomiting and lightheadedness.  Has attempted use of Topamax and Imitrex which has been ineffective.  History of migraines.  Denies generalized weakness, numbness, tingling, syncope, memory or speech changes.   ? ?Past Medical History:  ?Diagnosis Date  ? Acid reflux 2004  ? Anemia   ? Anxiety 2008  ? Asthma 1993  ? no previous intubations or hospitalizations   ? Carpal tunnel syndrome   ? Chlamydia   ? Depression 2008  ? no meds, currenly ok  ? Environmental allergies   ? GERD (gastroesophageal reflux disease)   ? Migraines   ? Miscarriage   ? Ovarian cyst   ? Pre-diabetes   ? Ulcer of the stomach and intestine 2004  ? Urinary tract infection   ? ? ?Patient Active Problem List  ? Diagnosis Date Noted  ? Chronic low back pain 12/26/2019  ? Anemia in pregnancy 01/23/2018  ? Morbid obesity with BMI of 45.0-49.9, adult (HCC) 10/19/2017  ? Seasonal allergic rhinitis 11/01/2016  ? Asthma 06/30/2015  ? Vitamin D deficiency 01/22/2015  ? Anxiety and depression 01/21/2015  ? ? ?Past Surgical History:  ?Procedure Laterality Date  ? CARPAL TUNNEL RELEASE Left   ? COLONOSCOPY  2004  ? IRRIGATION AND DEBRIDEMENT SEBACEOUS CYST N/A 06/01/2017  ? Procedure: EXCISION SEBACEOUS CYST ON SCALP;  Surgeon: Ancil Linsey, MD;  Location: [facility];  Service: General;  Laterality: N/A;  ? MULTIPLE TOOTH EXTRACTIONS  2015  ? 6 teeth removed   ? TUBAL LIGATION N/A 04/04/2018  ? Procedure: POST PARTUM TUBAL LIGATION;  Surgeon: Levie Heritage, DO;  Location: [facility];  Service: Gynecology;  Laterality: N/A;  ? WISDOM TOOTH EXTRACTION  2007  ? ? ?OB History   ? ? Gravida  ?7  ? Para   ?4  ? Term  ?4  ? Preterm  ?0  ? AB  ?3  ? Living  ?4  ?  ? ? SAB  ?3  ? IAB  ?0  ? Ectopic  ?0  ? Multiple  ?0  ? Live Births  ?4  ?   ?  ?  ? ? ? ?Home Medications   ? ?Prior to Admission medications   ?Medication Sig Start Date End Date Taking? Authorizing Provider  ?acetaminophen (TYLENOL) 500 MG tablet Take 500-1,000 mg by mouth every 6 (six) hours as needed for mild pain, moderate pain or headache (or migraines).     [provider]  ?albuterol (VENTOLIN HFA) 108 (90 Base) MCG/ACT inhaler Inhale 2 puffs into the lungs 4 (four) times daily as needed. 04/08/21     ?albuterol (PROVENTIL) (2.5 MG/3ML) 0.083% nebulizer solution Take 3 mLs (2.5 mg total) by nebulization every 6 (six) hours as needed for wheezing or shortness of breath. 10/14/19   Moshe Cipro, NP  ?albuterol (PROVENTIL) (2.5 MG/3ML) 0.083% nebulizer solution USE 1 VIAL EVERY 6 HOURS AND AS NEEDED DIAGNOSIS: ASTHMA: J44.9 03/12/20 03/12/21  Fleet Contras, MD  ?albuterol (PROVENTIL) (2.5 MG/3ML) 0.083% nebulizer solution Use 1 vial via nebulizer every 6 hours and as needed 05/15/21     ?albuterol (VENTOLIN HFA) 108 (90 Base)  MCG/ACT inhaler Inhale 1-2 puffs into the lungs every 6 (six) hours as needed for wheezing or shortness of breath. ?Patient not taking: Reported on 04/13/2021    [provider]  ?albuterol (VENTOLIN HFA) 108 (90 Base) MCG/ACT inhaler INHALE 2 PUFFS INTO THE LUNGS EVERY 4 (FOUR) HOURS AS NEEDED FOR WHEEZING OR SHORTNESS OF BREATH. 05/12/20 05/12/21  Evern CoreLindquist, John, PA-C  ?albuterol (VENTOLIN HFA) 108 (90 Base) MCG/ACT inhaler Inhale 2 puffs into the lungs 4 (four) times daily as needed. 05/15/21     ?cetirizine (ZYRTEC) 10 MG tablet Take 1 tablet (10 mg total) by mouth daily. Prn itching 01/02/20   Anders SimmondsMcClung, Angela M, PA-C  ?cetirizine (ZYRTEC) 10 MG tablet TAKE 1 TABLET BY MOUTH EVERY EVENING AS NEEDED FOR ALLERGIES 06/06/20 06/06/21  Fleet ContrasAvbuere, Edwin, MD  ?cetirizine (ZYRTEC) 10 MG tablet Take 1 tablet by  mouth every evening as needed for allergies 09/12/20     ?dextromethorphan-guaiFENesin (MUCINEX DM) 30-600 MG 12hr tablet Take 1 tablet by mouth 2 (two) times daily. ?Patient not taking: Reported on 04/13/2021 05/29/20   Eustace MooreNelson, Yvonne Sue, MD  ?diphenhydrAMINE (BENADRYL) 25 mg capsule Take 25-50 mg by mouth 2 (two) times daily as needed for allergies.    [provider]  ?DULoxetine (CYMBALTA) 30 MG capsule Take 1 capsule (30 mg total) by mouth daily. 12/14/19 01/13/20  Claiborne RiggFleming, Zelda W, NP  ?escitalopram (LEXAPRO) 10 MG tablet Take 1 tablet (10 mg total) by mouth daily. ?Patient not taking: Reported on 10/05/2019 06/27/19   Cain SaupeFulp, [name], MD  ?ferrous sulfate 324 (65 Fe) MG TBEC 1 pill daily after meal to help with anemia 11/22/18   [name], [name], MD  ?fexofenadine (ALLEGRA) 180 MG tablet Take 180 mg by mouth daily as needed for allergies or rhinitis.     [provider]  ?fluconazole (DIFLUCAN) 150 MG tablet Take 1 tablet by mouth every week as needed 10/27/20     ?fluconazole (DIFLUCAN) 150 MG tablet Take 1 tablet (150 mg total) by mouth once a week as needed 07/07/21     ?Fluocinolone Acetonide Body 0.01 % OIL Apply as directed 09/12/20     ?fluticasone (FLONASE) 50 MCG/ACT nasal spray Place 1 spray into both nostrils daily.    [provider]  ?fluticasone (FLONASE) 50 MCG/ACT nasal spray Use 2 sprays in each nostril once daily 09/12/20     ?HYDROcodone-acetaminophen (NORCO/VICODIN) 5-325 MG tablet Take 1 tablet by mouth every 6 hours for 7 days. ?Patient not taking: Reported on 10/11/2020 08/28/20     ?HYDROcodone-acetaminophen (NORCO/VICODIN) 5-325 MG tablet Take 1 tablet every 6 hours by mouth for 7 days. ?Patient not taking: Reported on 10/11/2020 09/08/20     ?hydrocortisone 2.5 % cream Apply 2 times daily as needed for bumps 05/15/21     ?hydrOXYzine (ATARAX/VISTARIL) 10 MG tablet Take 1 tablet (10 mg total) by mouth every 6 (six) hours as needed for itching. 12/16/19   Claiborne RiggFleming, Zelda W, NP   ?meloxicam (MOBIC) 15 MG tablet Take 1 tablet (15 mg total) by mouth daily. ?Patient not taking: Reported on 10/11/2020 01/02/20   Anders SimmondsMcClung, Angela M, PA-C  ?metroNIDAZOLE (FLAGYL) 500 MG tablet Take 1 tablet (500 mg total) by mouth 2 (two) times daily. ?Patient not taking: Reported on 04/13/2021 10/27/20     ?montelukast (SINGULAIR) 10 MG tablet Take 1 tablet (10 mg total) by mouth at bedtime. ?Patient not taking: Reported on 04/13/2021 10/14/19   Moshe CiproMatthews, Stephanie, NP  ?omeprazole (PRILOSEC) 40 MG capsule TAKE 1 CAPSULE (  40 MG TOTAL) BY MOUTH DAILY. TO REDUCE STOMACH ACID 03/21/20 03/21/21  Esterwood, Amy S, PA-C  ?oxyCODONE-acetaminophen (PERCOCET/ROXICET) 5-325 MG tablet Take 1 tablet by mouth every 6 hours for 7 days. ?Patient not taking: Reported on 04/13/2021 11/20/20     ?phentermine (ADIPEX-P) 37.5 MG tablet Take 37.5 mg by mouth at bedtime. ?Patient not taking: Reported on 04/13/2021 03/13/20   [provider]  ?phentermine (ADIPEX-P) 37.5 MG tablet TAKE 1 TABLET BY MOUTH EVERY MORNING 06/06/20 12/03/20  Fleet Contras, MD  ?phentermine (ADIPEX-P) 37.5 MG tablet TAKE ONE TABLET BY MOUTH EVERY MORNING 03/12/20 09/08/20  Fleet Contras, MD  ?phentermine (ADIPEX-P) 37.5 MG tablet Take 1 tablet by mouth every morning 09/12/20     ?phentermine (ADIPEX-P) 37.5 MG tablet Take 1 tablet (37.5 mg total) by mouth every morning. 07/20/21     ?predniSONE (DELTASONE) 20 MG tablet Take 1 tablet (20 mg total) by mouth 2 (two) times daily with a meal. ?Patient not taking: Reported on 04/13/2021 05/29/20   Eustace Moore, MD  ?[facility] HFA 108 [phone] Base) MCG/ACT inhaler INHALE 2 PUFFS INTO THE LUNGS 4 TIMES DAILY AS NEEDED 03/12/20 03/12/21  Fleet Contras, MD  ?promethazine (PHENERGAN) 25 MG tablet Take 1 tablet (25 mg total) by mouth every 6 (six) hours as needed for nausea or vomiting. 03/10/21   Tomi Bamberger, PA-C  ?SUMAtriptan (IMITREX) 100 MG tablet Take 1 tablet by mouth at onset of migraine symptoms, may repeat in  1-2 hours if persistent. 10/27/20     ?SUMAtriptan (IMITREX) 50 MG tablet Take 1 tablet (50 mg total) by mouth once as needed (at the onset of a migraine- may repeat once in 2 hours if headache persists or recu

## 2021-09-07 NOTE — Discharge Instructions (Signed)
Your vital signs are stable and no abnormalities was noted on your neurological exam therefore you have been treated for a migraine headache ? ?You have been given several injections today to help reduce the pressure associated with headaches as well as manage her nausea ? ?You may continue your prescribed medicine for migraines at home  ? ? ?Increase your fluid intake through use of water and avoid caffeine ? ?That you are getting adequate rest with low stimulation noise and lights to prevent further irritation ? ?If you continue to have migraines over the next week please follow-up with your primary doctor for further evaluation and management of your symptoms ?

## 2021-09-09 ENCOUNTER — Other Ambulatory Visit (HOSPITAL_COMMUNITY): Payer: Self-pay

## 2021-09-09 MED ORDER — PROMETHAZINE HCL 25 MG PO TABS
25.0000 mg | ORAL_TABLET | Freq: Four times a day (QID) | ORAL | 2 refills | Status: DC | PRN
  Filled 2021-09-09: qty 30, 8d supply, fill #0

## 2021-09-16 ENCOUNTER — Other Ambulatory Visit (HOSPITAL_COMMUNITY): Payer: Self-pay

## 2021-09-16 MED ORDER — AMITRIPTYLINE HCL 25 MG PO TABS
25.0000 mg | ORAL_TABLET | Freq: Every evening | ORAL | 2 refills | Status: DC | PRN
  Filled 2021-09-16: qty 30, 30d supply, fill #0

## 2021-09-21 ENCOUNTER — Other Ambulatory Visit (HOSPITAL_COMMUNITY): Payer: Self-pay

## 2021-09-22 ENCOUNTER — Other Ambulatory Visit (HOSPITAL_COMMUNITY): Payer: Self-pay

## 2021-09-30 ENCOUNTER — Other Ambulatory Visit (HOSPITAL_COMMUNITY): Payer: Self-pay

## 2021-10-06 ENCOUNTER — Other Ambulatory Visit (HOSPITAL_COMMUNITY): Payer: Self-pay

## 2021-10-14 ENCOUNTER — Other Ambulatory Visit (HOSPITAL_COMMUNITY): Payer: Self-pay

## 2021-10-14 MED ORDER — PHENTERMINE HCL 37.5 MG PO TABS
37.5000 mg | ORAL_TABLET | Freq: Every morning | ORAL | 2 refills | Status: DC
  Filled 2021-10-14 – 2021-11-20 (×2): qty 30, 30d supply, fill #0

## 2021-10-22 ENCOUNTER — Other Ambulatory Visit (HOSPITAL_COMMUNITY): Payer: Self-pay

## 2021-10-27 ENCOUNTER — Other Ambulatory Visit (HOSPITAL_COMMUNITY): Payer: Self-pay

## 2021-10-28 ENCOUNTER — Other Ambulatory Visit (HOSPITAL_COMMUNITY): Payer: Self-pay

## 2021-10-28 MED ORDER — METRONIDAZOLE 500 MG PO TABS
500.0000 mg | ORAL_TABLET | Freq: Two times a day (BID) | ORAL | 0 refills | Status: DC
  Filled 2021-10-28: qty 14, 7d supply, fill #0

## 2021-11-04 ENCOUNTER — Other Ambulatory Visit (HOSPITAL_COMMUNITY): Payer: Self-pay

## 2021-11-05 ENCOUNTER — Other Ambulatory Visit (HOSPITAL_COMMUNITY): Payer: Self-pay

## 2021-11-06 ENCOUNTER — Other Ambulatory Visit (HOSPITAL_COMMUNITY): Payer: Self-pay

## 2021-11-06 MED ORDER — METHYLPREDNISOLONE 4 MG PO TBPK
ORAL_TABLET | ORAL | 0 refills | Status: DC
  Filled 2021-11-06: qty 21, 6d supply, fill #0

## 2021-11-06 MED ORDER — OXYCODONE-ACETAMINOPHEN 5-325 MG PO TABS
1.0000 | ORAL_TABLET | Freq: Two times a day (BID) | ORAL | 0 refills | Status: DC | PRN
  Filled 2021-11-06: qty 14, 7d supply, fill #0

## 2021-11-11 ENCOUNTER — Other Ambulatory Visit (HOSPITAL_COMMUNITY): Payer: Self-pay

## 2021-11-11 MED ORDER — PROMETHAZINE-DM 6.25-15 MG/5ML PO SYRP
10.0000 mL | ORAL_SOLUTION | Freq: Two times a day (BID) | ORAL | 2 refills | Status: DC | PRN
  Filled 2021-11-11: qty 240, 12d supply, fill #0

## 2021-11-18 ENCOUNTER — Other Ambulatory Visit (HOSPITAL_COMMUNITY): Payer: Self-pay

## 2021-11-18 MED ORDER — OXYCODONE-ACETAMINOPHEN 5-325 MG PO TABS
1.0000 | ORAL_TABLET | Freq: Two times a day (BID) | ORAL | 0 refills | Status: DC | PRN
  Filled 2021-11-18: qty 14, 7d supply, fill #0

## 2021-11-20 ENCOUNTER — Other Ambulatory Visit (HOSPITAL_COMMUNITY): Payer: Self-pay

## 2021-11-24 ENCOUNTER — Emergency Department (HOSPITAL_BASED_OUTPATIENT_CLINIC_OR_DEPARTMENT_OTHER): Payer: BLUE CROSS/BLUE SHIELD

## 2021-11-24 ENCOUNTER — Other Ambulatory Visit: Payer: Self-pay

## 2021-11-24 ENCOUNTER — Inpatient Hospital Stay (HOSPITAL_BASED_OUTPATIENT_CLINIC_OR_DEPARTMENT_OTHER)
Admission: EM | Admit: 2021-11-24 | Discharge: 2021-11-27 | DRG: 202 | Disposition: A | Discharge status: Home / Self Care | Payer: BLUE CROSS/BLUE SHIELD | Attending: Pulmonary Disease | Admitting: Pulmonary Disease

## 2021-11-24 ENCOUNTER — Encounter (HOSPITAL_BASED_OUTPATIENT_CLINIC_OR_DEPARTMENT_OTHER): Payer: Self-pay | Admitting: Emergency Medicine

## 2021-11-24 DIAGNOSIS — M545 Low back pain, unspecified: Secondary | ICD-10-CM | POA: Diagnosis present

## 2021-11-24 DIAGNOSIS — Z832 Family history of diseases of the blood and blood-forming organs and certain disorders involving the immune mechanism: Secondary | ICD-10-CM

## 2021-11-24 DIAGNOSIS — Z7951 Long term (current) use of inhaled steroids: Secondary | ICD-10-CM | POA: Diagnosis not present

## 2021-11-24 DIAGNOSIS — K219 Gastro-esophageal reflux disease without esophagitis: Secondary | ICD-10-CM | POA: Diagnosis present

## 2021-11-24 DIAGNOSIS — R7303 Prediabetes: Secondary | ICD-10-CM | POA: Diagnosis present

## 2021-11-24 DIAGNOSIS — Z79899 Other long term (current) drug therapy: Secondary | ICD-10-CM

## 2021-11-24 DIAGNOSIS — E876 Hypokalemia: Secondary | ICD-10-CM | POA: Diagnosis present

## 2021-11-24 DIAGNOSIS — Z6841 Body Mass Index (BMI) 40.0 and over, adult: Secondary | ICD-10-CM | POA: Diagnosis not present

## 2021-11-24 DIAGNOSIS — Z833 Family history of diabetes mellitus: Secondary | ICD-10-CM

## 2021-11-24 DIAGNOSIS — R0603 Acute respiratory distress: Secondary | ICD-10-CM | POA: Diagnosis present

## 2021-11-24 DIAGNOSIS — Z8711 Personal history of peptic ulcer disease: Secondary | ICD-10-CM

## 2021-11-24 DIAGNOSIS — Z888 Allergy status to other drugs, medicaments and biological substances status: Secondary | ICD-10-CM | POA: Diagnosis not present

## 2021-11-24 DIAGNOSIS — D649 Anemia, unspecified: Secondary | ICD-10-CM

## 2021-11-24 DIAGNOSIS — G8929 Other chronic pain: Secondary | ICD-10-CM | POA: Diagnosis present

## 2021-11-24 DIAGNOSIS — Z8249 Family history of ischemic heart disease and other diseases of the circulatory system: Secondary | ICD-10-CM | POA: Diagnosis not present

## 2021-11-24 DIAGNOSIS — Z8616 Personal history of COVID-19: Secondary | ICD-10-CM | POA: Diagnosis not present

## 2021-11-24 DIAGNOSIS — Z825 Family history of asthma and other chronic lower respiratory diseases: Secondary | ICD-10-CM

## 2021-11-24 DIAGNOSIS — F419 Anxiety disorder, unspecified: Secondary | ICD-10-CM | POA: Diagnosis present

## 2021-11-24 DIAGNOSIS — J4541 Moderate persistent asthma with (acute) exacerbation: Secondary | ICD-10-CM | POA: Diagnosis present

## 2021-11-24 DIAGNOSIS — J441 Chronic obstructive pulmonary disease with (acute) exacerbation: Secondary | ICD-10-CM | POA: Diagnosis present

## 2021-11-24 DIAGNOSIS — J4540 Moderate persistent asthma, uncomplicated: Secondary | ICD-10-CM

## 2021-11-24 DIAGNOSIS — Z91040 Latex allergy status: Secondary | ICD-10-CM | POA: Diagnosis not present

## 2021-11-24 DIAGNOSIS — J9690 Respiratory failure, unspecified, unspecified whether with hypoxia or hypercapnia: Secondary | ICD-10-CM | POA: Diagnosis present

## 2021-11-24 DIAGNOSIS — J9601 Acute respiratory failure with hypoxia: Secondary | ICD-10-CM

## 2021-11-24 DIAGNOSIS — J45909 Unspecified asthma, uncomplicated: Secondary | ICD-10-CM

## 2021-11-24 DIAGNOSIS — J9600 Acute respiratory failure, unspecified whether with hypoxia or hypercapnia: Principal | ICD-10-CM

## 2021-11-24 DIAGNOSIS — F32A Depression, unspecified: Secondary | ICD-10-CM | POA: Diagnosis present

## 2021-11-24 DIAGNOSIS — R0600 Dyspnea, unspecified: Secondary | ICD-10-CM | POA: Diagnosis not present

## 2021-11-24 LAB — CBC WITH DIFFERENTIAL/PLATELET
Abs Immature Granulocytes: 0.03 10*3/uL (ref 0.00–0.07)
Basophils Absolute: 0 10*3/uL (ref 0.0–0.1)
Basophils Relative: 0 %
Eosinophils Absolute: 0.3 10*3/uL (ref 0.0–0.5)
Eosinophils Relative: 3 %
HCT: 36 % (ref 36.0–46.0)
Hemoglobin: 11.4 g/dL — ABNORMAL LOW (ref 12.0–15.0)
Immature Granulocytes: 0 %
Lymphocytes Relative: 42 %
Lymphs Abs: 4.3 10*3/uL — ABNORMAL HIGH (ref 0.7–4.0)
MCH: 25.4 pg — ABNORMAL LOW (ref 26.0–34.0)
MCHC: 31.7 g/dL (ref 30.0–36.0)
MCV: 80.4 fL (ref 80.0–100.0)
Monocytes Absolute: 0.6 10*3/uL (ref 0.1–1.0)
Monocytes Relative: 6 %
Neutro Abs: 5 10*3/uL (ref 1.7–7.7)
Neutrophils Relative %: 49 %
Platelets: 371 10*3/uL (ref 150–400)
RBC: 4.48 MIL/uL (ref 3.87–5.11)
RDW: 14.2 % (ref 11.5–15.5)
WBC: 10.3 10*3/uL (ref 4.0–10.5)
nRBC: 0 % (ref 0.0–0.2)

## 2021-11-24 LAB — BASIC METABOLIC PANEL
Anion gap: 7 (ref 5–15)
BUN: 10 mg/dL (ref 6–20)
CO2: 24 mmol/L (ref 22–32)
Calcium: 8.9 mg/dL (ref 8.9–10.3)
Chloride: 106 mmol/L (ref 98–111)
Creatinine, Ser: 0.82 mg/dL (ref 0.44–1.00)
GFR, Estimated: >60 mL/min (ref >60)
Glucose, Bld: 86 mg/dL (ref 70–99)
Potassium: 3.4 mmol/L — ABNORMAL LOW (ref 3.5–5.1)
Sodium: 137 mmol/L (ref 135–145)

## 2021-11-24 LAB — I-STAT ARTERIAL BLOOD GAS, ED
Acid-base deficit: 1 mmol/L (ref 0.0–2.0)
Bicarbonate: 20.1 mmol/L (ref 20.0–28.0)
Calcium, Ion: 1.15 mmol/L (ref 1.15–1.40)
HCT: 35 % — ABNORMAL LOW (ref 36.0–46.0)
Hemoglobin: 11.9 g/dL — ABNORMAL LOW (ref 12.0–15.0)
O2 Saturation: 100 %
Potassium: 2.9 mmol/L — ABNORMAL LOW (ref 3.5–5.1)
Sodium: 139 mmol/L (ref 135–145)
TCO2: 21 mmol/L — ABNORMAL LOW (ref 22–32)
pCO2 arterial: 23.5 mmHg — ABNORMAL LOW (ref 32–48)
pH, Arterial: 7.539 — ABNORMAL HIGH (ref 7.35–7.45)
pO2, Arterial: 166 mmHg — ABNORMAL HIGH (ref 83–108)

## 2021-11-24 LAB — TROPONIN I (HIGH SENSITIVITY)
Troponin I (High Sensitivity): <2 ng/L (ref <18)
Troponin I (High Sensitivity): <2 ng/L (ref <18)

## 2021-11-24 LAB — D-DIMER, QUANTITATIVE: D-Dimer, Quant: 0.67 ug/mL-FEU — ABNORMAL HIGH (ref 0.00–0.50)

## 2021-11-24 LAB — PREGNANCY, URINE: Preg Test, Ur: NEGATIVE

## 2021-11-24 LAB — MRSA NEXT GEN BY PCR, NASAL: MRSA by PCR Next Gen: NOT DETECTED

## 2021-11-24 LAB — SARS CORONAVIRUS 2 BY RT PCR: SARS Coronavirus 2 by RT PCR: NEGATIVE

## 2021-11-24 MED ORDER — LIDOCAINE 5 % EX PTCH
1.0000 | MEDICATED_PATCH | CUTANEOUS | Status: DC
  Administered 2021-11-24 – 2021-11-26 (×3): 1 via TRANSDERMAL
  Filled 2021-11-24 (×4): qty 1

## 2021-11-24 MED ORDER — METHYLPREDNISOLONE SODIUM SUCC 125 MG IJ SOLR
125.0000 mg | Freq: Once | INTRAMUSCULAR | Status: AC
  Administered 2021-11-24: 125 mg via INTRAVENOUS
  Filled 2021-11-24: qty 2

## 2021-11-24 MED ORDER — POTASSIUM CHLORIDE CRYS ER 20 MEQ PO TBCR
40.0000 meq | EXTENDED_RELEASE_TABLET | Freq: Once | ORAL | Status: DC
Start: 2021-11-24 — End: 2021-11-25
  Filled 2021-11-24: qty 2

## 2021-11-24 MED ORDER — TIZANIDINE HCL 4 MG PO TABS
4.0000 mg | ORAL_TABLET | Freq: Four times a day (QID) | ORAL | Status: DC | PRN
Start: 2021-11-24 — End: 2021-11-27

## 2021-11-24 MED ORDER — METHYLPREDNISOLONE SODIUM SUCC 125 MG IJ SOLR
120.0000 mg | Freq: Two times a day (BID) | INTRAMUSCULAR | Status: DC
  Administered 2021-11-24: 120 mg via INTRAVENOUS
  Filled 2021-11-24: qty 2

## 2021-11-24 MED ORDER — IPRATROPIUM-ALBUTEROL 0.5-2.5 (3) MG/3ML IN SOLN
3.0000 mL | RESPIRATORY_TRACT | Status: DC | PRN
  Administered 2021-11-25: 3 mL via RESPIRATORY_TRACT
  Filled 2021-11-24: qty 3

## 2021-11-24 MED ORDER — MONTELUKAST SODIUM 10 MG PO TABS
10.0000 mg | ORAL_TABLET | Freq: Every day | ORAL | Status: DC
  Administered 2021-11-25 – 2021-11-26 (×2): 10 mg via ORAL
  Filled 2021-11-24 (×2): qty 1

## 2021-11-24 MED ORDER — BENZONATATE 100 MG PO CAPS: 100.0000 mg | ORAL_CAPSULE | Freq: Three times a day (TID) | ORAL | Status: DC | PRN

## 2021-11-24 MED ORDER — REVEFENACIN 175 MCG/3ML IN SOLN
175.0000 ug | Freq: Every day | RESPIRATORY_TRACT | Status: DC
  Administered 2021-11-25 – 2021-11-27 (×3): 175 ug via RESPIRATORY_TRACT
  Filled 2021-11-24 (×3): qty 3

## 2021-11-24 MED ORDER — FLUTICASONE PROPIONATE 50 MCG/ACT NA SUSP: 1.0000 | Freq: Every day | NASAL | Status: DC

## 2021-11-24 MED ORDER — TOPIRAMATE 100 MG PO TABS
100.0000 mg | ORAL_TABLET | Freq: Every day | ORAL | Status: DC
  Filled 2021-11-24: qty 1

## 2021-11-24 MED ORDER — HYDROXYZINE HCL 10 MG PO TABS: 10.0000 mg | ORAL_TABLET | Freq: Four times a day (QID) | ORAL | Status: DC | PRN

## 2021-11-24 MED ORDER — KETOROLAC TROMETHAMINE 15 MG/ML IJ SOLN
15.0000 mg | Freq: Once | INTRAMUSCULAR | Status: AC
  Administered 2021-11-24: 15 mg via INTRAVENOUS
  Filled 2021-11-24: qty 1

## 2021-11-24 MED ORDER — ARFORMOTEROL TARTRATE 15 MCG/2ML IN NEBU
15.0000 ug | INHALATION_SOLUTION | Freq: Two times a day (BID) | RESPIRATORY_TRACT | Status: DC
  Administered 2021-11-24 – 2021-11-27 (×6): 15 ug via RESPIRATORY_TRACT
  Filled 2021-11-24 (×6): qty 2

## 2021-11-24 MED ORDER — CHLORHEXIDINE GLUCONATE CLOTH 2 % EX PADS
6.0000 | MEDICATED_PAD | Freq: Every day | CUTANEOUS | Status: DC
  Administered 2021-11-24 – 2021-11-26 (×3): 6 via TOPICAL

## 2021-11-24 MED ORDER — STERILE WATER FOR INJECTION IJ SOLN
INTRAMUSCULAR | Status: AC
  Administered 2021-11-24: 10 mL
  Filled 2021-11-24: qty 10

## 2021-11-24 MED ORDER — BUDESONIDE 0.25 MG/2ML IN SUSP
0.2500 mg | Freq: Two times a day (BID) | RESPIRATORY_TRACT | Status: DC
  Administered 2021-11-24 – 2021-11-27 (×6): 0.25 mg via RESPIRATORY_TRACT
  Filled 2021-11-24 (×6): qty 2

## 2021-11-24 MED ORDER — MAGNESIUM SULFATE 2 GM/50ML IV SOLN
2.0000 g | Freq: Once | INTRAVENOUS | Status: AC
  Administered 2021-11-24: 2 g via INTRAVENOUS
  Filled 2021-11-24: qty 50

## 2021-11-24 MED ORDER — AMITRIPTYLINE HCL 25 MG PO TABS
25.0000 mg | ORAL_TABLET | Freq: Every evening | ORAL | Status: DC | PRN
Start: 2021-11-24 — End: 2021-11-27
  Filled 2021-11-24: qty 1

## 2021-11-24 MED ORDER — FAMOTIDINE IN NACL 20-0.9 MG/50ML-% IV SOLN
20.0000 mg | Freq: Two times a day (BID) | INTRAVENOUS | Status: DC
  Administered 2021-11-24 – 2021-11-26 (×5): 20 mg via INTRAVENOUS
  Filled 2021-11-24 (×7): qty 50

## 2021-11-24 MED ORDER — KETOROLAC TROMETHAMINE 15 MG/ML IJ SOLN
15.0000 mg | Freq: Once | INTRAMUSCULAR | Status: AC
Start: 2021-11-24 — End: 2021-11-24
  Administered 2021-11-24: 15 mg via INTRAVENOUS
  Filled 2021-11-24: qty 1

## 2021-11-24 MED ORDER — FERROUS SULFATE 325 (65 FE) MG PO TABS
325.0000 mg | ORAL_TABLET | Freq: Every day | ORAL | Status: DC
  Administered 2021-11-25: 325 mg via ORAL
  Filled 2021-11-24 (×2): qty 1

## 2021-11-24 MED ORDER — ORAL CARE MOUTH RINSE
15.0000 mL | OROMUCOSAL | Status: DC
  Administered 2021-11-24 – 2021-11-26 (×6): 15 mL via OROMUCOSAL

## 2021-11-24 MED ORDER — ALBUTEROL SULFATE (2.5 MG/3ML) 0.083% IN NEBU
INHALATION_SOLUTION | RESPIRATORY_TRACT | Status: AC
  Filled 2021-11-24: qty 3

## 2021-11-24 MED ORDER — IOHEXOL 350 MG/ML SOLN
100.0000 mL | Freq: Once | INTRAVENOUS | Status: AC | PRN
  Administered 2021-11-24: 100 mL via INTRAVENOUS

## 2021-11-24 MED ORDER — LORATADINE 10 MG PO TABS
10.0000 mg | ORAL_TABLET | Freq: Every day | ORAL | Status: DC
  Administered 2021-11-26 – 2021-11-27 (×2): 10 mg via ORAL
  Filled 2021-11-24 (×3): qty 1

## 2021-11-24 MED ORDER — ALBUTEROL SULFATE (2.5 MG/3ML) 0.083% IN NEBU: 2.5000 mg | INHALATION_SOLUTION | Freq: Once | RESPIRATORY_TRACT | Status: DC

## 2021-11-24 MED ORDER — HYDROCODONE-ACETAMINOPHEN 5-325 MG PO TABS
1.0000 | ORAL_TABLET | Freq: Once | ORAL | Status: DC
  Filled 2021-11-24: qty 1

## 2021-11-24 MED ORDER — ORAL CARE MOUTH RINSE: 15.0000 mL | OROMUCOSAL | Status: DC | PRN

## 2021-11-24 MED ORDER — MORPHINE SULFATE (PF) 4 MG/ML IV SOLN
4.0000 mg | Freq: Once | INTRAVENOUS | Status: AC
  Administered 2021-11-24: 4 mg via INTRAVENOUS
  Filled 2021-11-24: qty 1

## 2021-11-24 MED ORDER — ALBUTEROL SULFATE (2.5 MG/3ML) 0.083% IN NEBU
5.0000 mg/h | INHALATION_SOLUTION | Freq: Once | RESPIRATORY_TRACT | Status: AC
  Administered 2021-11-24: 5 mg/h via RESPIRATORY_TRACT
  Filled 2021-11-24 (×2): qty 6

## 2021-11-24 MED ORDER — SUMATRIPTAN SUCCINATE 50 MG PO TABS
100.0000 mg | ORAL_TABLET | Freq: Four times a day (QID) | ORAL | Status: DC | PRN
  Administered 2021-11-25: 100 mg via ORAL
  Filled 2021-11-24 (×2): qty 2

## 2021-11-24 MED ORDER — LORAZEPAM 2 MG/ML IJ SOLN: 1.0000 mg | Freq: Once | INTRAMUSCULAR | Status: DC

## 2021-11-24 MED ORDER — LORAZEPAM 2 MG/ML IJ SOLN
2.0000 mg | Freq: Once | INTRAMUSCULAR | Status: AC
  Administered 2021-11-24: 2 mg via INTRAVENOUS
  Filled 2021-11-24: qty 1

## 2021-11-24 MED ORDER — TOPIRAMATE 25 MG PO TABS: 50.0000 mg | ORAL_TABLET | Freq: Two times a day (BID) | ORAL | Status: DC

## 2021-11-24 MED ORDER — IPRATROPIUM-ALBUTEROL 0.5-2.5 (3) MG/3ML IN SOLN
3.0000 mL | Freq: Once | RESPIRATORY_TRACT | Status: AC
  Administered 2021-11-24: 3 mL via RESPIRATORY_TRACT
  Filled 2021-11-24: qty 3

## 2021-11-24 MED ORDER — GUAIFENESIN 100 MG/5ML PO LIQD
5.0000 mL | Freq: Once | ORAL | Status: AC
  Administered 2021-11-24: 5 mL via ORAL
  Filled 2021-11-24: qty 10

## 2021-11-24 MED ORDER — SODIUM CHLORIDE 0.9 % IV SOLN
6.2500 mg | Freq: Four times a day (QID) | INTRAVENOUS | Status: DC | PRN
  Administered 2021-11-25 – 2021-11-26 (×2): 6.25 mg via INTRAVENOUS
  Filled 2021-11-24 (×5): qty 0.25

## 2021-11-24 NOTE — ED Provider Notes (Addendum)
[facility] EMERGENCY DEPARTMENT Provider Note   CSN: [id number] Arrival date & time: 11/24/21  1214     History  Chief Complaint  Patient presents with   Chest Pain    Laurie Reed is a 36 y.o. female with a past medical history of asthma, anxiety and obesity presenting today with a complaint of chest pain, cough and back pain.  Reports that for the past 2 months she has had a dry cough.  She saw her PCP 2 weeks ago who did an x-ray and said that she was having a flareup of her asthma.  She was given a prednisone course and scheduled albuterol.  Yesterday she started to have pain in her chest and back that she says might be secondary to long-term cough.  No fevers or chills.  No leg swelling, recent travel, recent surgery, OCP use, history of DVT or hemoptysis.      Chest Pain Associated symptoms: back pain, cough and shortness of breath        Home Medications Prior to Admission medications   Medication Sig Start Date End Date Taking? Authorizing Provider  acetaminophen (TYLENOL) 500 MG tablet Take 500-1,000 mg by mouth every 6 (six) hours as needed for mild pain, moderate pain or headache (or migraines).     [provider]  albuterol (VENTOLIN HFA) 108 (90 Base) MCG/ACT inhaler Inhale 2 puffs into the lungs 4 (four) times daily as needed. 04/08/21     albuterol (PROVENTIL) (2.5 MG/3ML) 0.083% nebulizer solution Take 3 mLs (2.5 mg total) by nebulization every 6 (six) hours as needed for wheezing or shortness of breath. 10/14/19   Moshe CiproMatthews, Stephanie, NP  albuterol (PROVENTIL) (2.5 MG/3ML) 0.083% nebulizer solution USE 1 VIAL EVERY 6 HOURS AND AS NEEDED DIAGNOSIS: ASTHMA: J44.9 03/12/20 03/12/21  Fleet ContrasAvbuere, Edwin, MD  albuterol (PROVENTIL) (2.5 MG/3ML) 0.083% nebulizer solution Use 1 vial via nebulizer every 6 hours and as needed 05/15/21     albuterol (VENTOLIN HFA) 108 (90 Base) MCG/ACT inhaler Inhale 1-2 puffs into the lungs every 6 (six) hours as needed  for wheezing or shortness of breath. Patient not taking: Reported on 04/13/2021    [provider]  albuterol (VENTOLIN HFA) 108 (90 Base) MCG/ACT inhaler INHALE 2 PUFFS INTO THE LUNGS EVERY 4 (FOUR) HOURS AS NEEDED FOR WHEEZING OR SHORTNESS OF BREATH. 05/12/20 05/12/21  Evern CoreLindquist, John, PA-C  albuterol (VENTOLIN HFA) 108 (90 Base) MCG/ACT inhaler Inhale 2 puffs into the lungs 4 (four) times daily as needed. 05/15/21     amitriptyline (ELAVIL) 25 MG tablet Take 1 tablet (25 mg total) by mouth at bedtime as needed. 09/16/21     cetirizine (ZYRTEC) 10 MG tablet Take 1 tablet (10 mg total) by mouth daily. Prn itching 01/02/20   Anders SimmondsMcClung, Angela M, PA-C  cetirizine (ZYRTEC) 10 MG tablet TAKE 1 TABLET BY MOUTH EVERY EVENING AS NEEDED FOR ALLERGIES 06/06/20 06/06/21  Fleet ContrasAvbuere, Edwin, MD  cetirizine (ZYRTEC) 10 MG tablet Take 1 tablet by mouth every evening as needed for allergies 09/12/20     dextromethorphan-guaiFENesin (MUCINEX DM) 30-600 MG 12hr tablet Take 1 tablet by mouth 2 (two) times daily. Patient not taking: Reported on 04/13/2021 05/29/20   Eustace MooreNelson, Yvonne Sue, MD  diphenhydrAMINE (BENADRYL) 25 mg capsule Take 25-50 mg by mouth 2 (two) times daily as needed for allergies.    [provider]  DULoxetine (CYMBALTA) 30 MG capsule Take 1 capsule (30 mg total) by mouth daily. 12/14/19 01/13/20  Claiborne RiggFleming, Zelda W, NP  escitalopram (LEXAPRO) 10 MG tablet Take 1 tablet (10 mg total) by mouth daily. Patient not taking: Reported on 10/05/2019 06/27/19   Cain Saupe, MD  ferrous sulfate 324 (65 Fe) MG TBEC 1 pill daily after meal to help with anemia 11/22/18   [name], [name], MD  fexofenadine (ALLEGRA) 180 MG tablet Take 180 mg by mouth daily as needed for allergies or rhinitis.     [provider]  fluconazole (DIFLUCAN) 150 MG tablet Take 1 tablet by mouth every week as needed 10/27/20     fluconazole (DIFLUCAN) 150 MG tablet Take 1 tablet (150 mg total) by mouth once a week as needed 07/07/21      Fluocinolone Acetonide Body 0.01 % OIL Apply as directed 09/12/20     fluticasone (FLONASE) 50 MCG/ACT nasal spray Place 1 spray into both nostrils daily.    [provider]  fluticasone (FLONASE) 50 MCG/ACT nasal spray Use 2 sprays in each nostril once daily 09/12/20     HYDROcodone-acetaminophen (NORCO/VICODIN) 5-325 MG tablet Take 1 tablet by mouth every 6 hours for 7 days. Patient not taking: Reported on 10/11/2020 08/28/20     HYDROcodone-acetaminophen (NORCO/VICODIN) 5-325 MG tablet Take 1 tablet every 6 hours by mouth for 7 days. Patient not taking: Reported on 10/11/2020 09/08/20     hydrocortisone 2.5 % cream Apply 2 times daily as needed for bumps 05/15/21     hydrOXYzine (ATARAX/VISTARIL) 10 MG tablet Take 1 tablet (10 mg total) by mouth every 6 (six) hours as needed for itching. 12/16/19   Claiborne Rigg, NP  meloxicam (MOBIC) 15 MG tablet Take 1 tablet (15 mg total) by mouth daily. Patient not taking: Reported on 10/11/2020 01/02/20   Anders Simmonds, PA-C  methylPREDNISolone (MEDROL) 4 MG TBPK tablet Take as directed 11/06/21     metroNIDAZOLE (FLAGYL) 500 MG tablet Take 1 tablet (500 mg total) by mouth 2 (two) times daily. Patient not taking: Reported on 04/13/2021 10/27/20     metroNIDAZOLE (FLAGYL) 500 MG tablet Take 1 tablet (500 mg total) by mouth 2 (two) times daily. 10/28/21     montelukast (SINGULAIR) 10 MG tablet Take 1 tablet (10 mg total) by mouth at bedtime. Patient not taking: Reported on 04/13/2021 10/14/19   Moshe Cipro, NP  omeprazole (PRILOSEC) 40 MG capsule TAKE 1 CAPSULE (40 MG TOTAL) BY MOUTH DAILY. TO REDUCE STOMACH ACID 03/21/20 03/21/21  Esterwood, Amy S, PA-C  oxyCODONE-acetaminophen (PERCOCET/ROXICET) 5-325 MG tablet Take 1 tablet by mouth every 6 hours for 7 days. Patient not taking: Reported on 04/13/2021 11/20/20     oxyCODONE-acetaminophen (PERCOCET/ROXICET) 5-325 MG tablet Take 1 tablet by mouth 2 (two) times daily as needed. 11/18/21      phentermine (ADIPEX-P) 37.5 MG tablet Take 37.5 mg by mouth at bedtime. Patient not taking: Reported on 04/13/2021 03/13/20   [provider]  phentermine (ADIPEX-P) 37.5 MG tablet TAKE 1 TABLET BY MOUTH EVERY MORNING 06/06/20 12/03/20  Fleet Contras, MD  phentermine (ADIPEX-P) 37.5 MG tablet TAKE ONE TABLET BY MOUTH EVERY MORNING 03/12/20 09/08/20  Fleet Contras, MD  phentermine (ADIPEX-P) 37.5 MG tablet Take 1 tablet by mouth every morning 09/12/20     phentermine (ADIPEX-P) 37.5 MG tablet Take 1 tablet (37.5 mg total) by mouth every morning. 07/20/21     phentermine (ADIPEX-P) 37.5 MG tablet Take 1 tablet (37.5 mg total) by mouth in the morning. 10/14/21     predniSONE (DELTASONE) 20 MG tablet Take 1 tablet (20 mg total) by mouth  2 (two) times daily with a meal. Patient not taking: Reported on 04/13/2021 05/29/20   Eustace Moore, MD  PROAIR HFA 108 [phone] Base) MCG/ACT inhaler INHALE 2 PUFFS INTO THE LUNGS 4 TIMES DAILY AS NEEDED 03/12/20 03/12/21  Fleet Contras, MD  promethazine (PHENERGAN) 25 MG tablet Take 1 tablet (25 mg total) by mouth every 6 (six) hours as needed for nausea or vomiting. 03/10/21   Tomi Bamberger, PA-C  promethazine (PHENERGAN) 25 MG tablet Take 1 tablet (25 mg total) by mouth every 6 (six) hours as needed. 09/09/21     promethazine-dextromethorphan (PROMETHAZINE-DM) 6.25-15 MG/5ML syrup Take 10 mLs by mouth 2 (two) times daily as needed for cough and congestion 11/11/21     SUMAtriptan (IMITREX) 100 MG tablet Take 1 tablet by mouth at onset of migraine symptoms, may repeat in 1-2 hours if persistent. 10/27/20     SUMAtriptan (IMITREX) 50 MG tablet Take 1 tablet (50 mg total) by mouth once as needed (at the onset of a migraine- may repeat once in 2 hours if headache persists or recurs). Patient not taking: Reported on 04/13/2021 11/22/18   Hoy Register, MD  SYMBICORT 160-4.5 MCG/ACT inhaler Inhale 2 puffs into the lungs 2 (two) times daily. 08/14/21     tiZANidine  (ZANAFLEX) 4 MG tablet Take 1 tablet by mouth twice daily as needed 09/12/20     topiramate (TOPAMAX) 100 MG tablet Take 1 tablet by mouth once a day 10/27/20     topiramate (TOPAMAX) 50 MG tablet Take 1 tablet (50 mg total) by mouth 2 (two) times daily. 11/22/18   Hoy Register, MD  triamcinolone cream (KENALOG) 0.1 % Apply 1 application topically 2 (two) times daily as needed. 07/13/21     triamcinolone cream (KENALOG) 0.1 % Apply 1 application topically 2 (two) times daily as needed. 07/13/21     Vitamin D, Ergocalciferol, (DRISDOL) 1.25 MG (50000 UNIT) CAPS capsule Take 1 capsule (50,000 Units total) by mouth once a week. 07/21/21     dicyclomine (BENTYL) 10 MG capsule Take 1 capsule (10 mg total) by mouth 4 (four) times daily -  before meals and at bedtime. For stomach cramping Patient not taking: Reported on 10/25/2019 06/13/19 10/25/19  [name], Hewitt Shorts, MD  sucralfate (CARAFATE) 1 GM/10ML suspension Take 10 mLs (1 g total) by mouth 4 (four) times daily -  with meals and at bedtime. Patient not taking: Reported on 10/25/2019 06/13/19 10/25/19  Cain Saupe, MD      Allergies    Latex, Other, Ondansetron, Tree extract, and Zofran    Review of Systems   Review of Systems  Respiratory:  Positive for cough and shortness of breath.   Cardiovascular:  Positive for chest pain.  Musculoskeletal:  Positive for back pain.    Physical Exam Updated Vital Signs BP 127/89 (BP Location: Right Arm)   Pulse 99   Temp 98.8 F (37.1 C) (Oral)   Resp 20   Ht 5\' 2"  (1.575 m)   Wt 125.2 kg   LMP 11/19/2021   SpO2 100%   BMI 50.48 kg/m  Physical Exam Vitals and nursing note reviewed.  Constitutional:      General: She is not in acute distress.    Appearance: Normal appearance. She is not ill-appearing.  HENT:     Head: Normocephalic and atraumatic.  Eyes:     General: No scleral icterus.    Conjunctiva/sclera: Conjunctivae normal.  Cardiovascular:     Rate and Rhythm: Regular rhythm. Tachycardia  present. No extrasystoles are present.    Heart sounds: Normal heart sounds.  Pulmonary:     Effort: Accessory muscle usage present. No tachypnea or respiratory distress.     Breath sounds: Wheezing present. No decreased breath sounds, rhonchi or rales.  Skin:    General: Skin is warm and dry.     Findings: No rash.  Neurological:     Mental Status: She is alert.  Psychiatric:        Mood and Affect: Mood normal.     ED Results / Procedures / Treatments   Labs (all labs ordered are listed, but only abnormal results are displayed) Labs Reviewed  BASIC METABOLIC PANEL - Abnormal; Notable for the following components:      Result Value   Potassium 3.4 (*)    All other components within normal limits  CBC WITH DIFFERENTIAL/PLATELET - Abnormal; Notable for the following components:   Hemoglobin 11.4 (*)    MCH 25.4 (*)    Lymphs Abs 4.3 (*)    All other components within normal limits  SARS CORONAVIRUS 2 BY RT PCR  PREGNANCY, URINE  D-DIMER, QUANTITATIVE  TROPONIN I (HIGH SENSITIVITY)    EKG EKG Interpretation  Date/Time:  Tuesday November 24 2021 12:46:20 EDT Ventricular Rate:  105 PR Interval:  138 QRS Duration: 96 QT Interval:  364 QTC Calculation: 481 R Axis:   66 Text Interpretation: Sinus tachycardia Cannot rule out Anterior infarct , age undetermined Abnormal ECG When compared with ECG of 11-Dec-2019 20:37, No significant change since last tracing Confirmed by Alvira Monday ([id number]) on 11/24/2021 1:23:18 PM  Radiology CT Angio Chest PE W and/or Wo Contrast  Result Date: 11/24/2021 CLINICAL DATA:  Increasing cough and shortness of breath since March. Elevated D-dimer. Concern for pulmonary embolus. EXAM: CT ANGIOGRAPHY CHEST WITH CONTRAST TECHNIQUE: Multidetector CT imaging of the chest was performed using the standard protocol during bolus administration of intravenous contrast. Multiplanar CT image reconstructions and MIPs were obtained to evaluate the vascular  anatomy. RADIATION DOSE REDUCTION: This exam was performed according to the departmental dose-optimization program which includes automated exposure control, adjustment of the mA and/or kV according to patient size and/or use of iterative reconstruction technique. CONTRAST:  OMNIPAQUE IOHEXOL 350 MG/ML SOLN COMPARISON:  CT October 25, 2019. FINDINGS: Cardiovascular: Satisfactory opacification of the pulmonary arteries to the lobar level. No evidence of pulmonary embolism to this level. Borderline cardiac enlargement similar prior. No significant pericardial effusion/thickening. Mediastinum/Nodes: No suspicious thyroid nodule. No pathologically enlarged mediastinal, hilar or axillary lymph nodes. The esophagus is grossly unremarkable. Lungs/Pleura: Stable solid 5 mm subpleural pulmonary nodule unchanged dating back to October 25, 2019 consistent with a benign finding. No suspicious pulmonary nodules or masses. No focal airspace consolidation. No pleural effusion. No pneumothorax. Upper Abdomen: No acute abnormality. Musculoskeletal: No chest wall abnormality. No acute or significant osseous findings. Review of the MIP images confirms the above findings. IMPRESSION: No evidence of pulmonary embolus or other acute cardiopulmonary finding. Electronically Signed   By: Maudry Mayhew M.D.   On: 11/24/2021 16:25   DG Chest Portable 1 View  Result Date: 11/24/2021 CLINICAL DATA:  Shortness of breath and cough. EXAM: PORTABLE CHEST 1 VIEW COMPARISON:  11/24/2021 FINDINGS: The cardiac silhouette, mediastinal and hilar contours are normal. The lungs are clear. No pleural effusions. No pulmonary lesions. The bony thorax is intact. IMPRESSION: No acute cardiopulmonary findings. Electronically Signed   By: Rudie Meyer M.D.   On: 11/24/2021 13:12  Procedures .Critical Care  Performed by: Saddie Benders, PA-C Authorized by: Saddie Benders, PA-C   Critical care provider statement:    Critical care time  (minutes):  120   Critical care time was exclusive of:  Separately billable procedures and treating other patients   Critical care was necessary to treat or prevent imminent or life-threatening deterioration of the following conditions:  Respiratory failure   Critical care was time spent personally by me on the following activities:  Development of treatment plan with patient or surrogate, discussions with consultants, obtaining history from patient or surrogate, ordering and performing treatments and interventions, ordering and review of laboratory studies, ordering and review of radiographic studies, pulse oximetry, review of old charts and re-evaluation of patient's condition   I assumed direction of critical care for this patient from another provider in my specialty: no     Care discussed with: admitting provider and accepting provider at another facility      Medications Ordered in ED Medications  albuterol (PROVENTIL) (2.5 MG/3ML) 0.083% nebulizer solution 2.5 mg (has no administration in time range)    ED Course/ Medical Decision Making/ A&P Clinical Course as of 11/24/21 1651  Tue Nov 24, 2021  1412 Went to reevaluate the patient after her medications.  Her work of breathing had worsened and she was tachypneic around 33 as opposed to 19 when I first evaluated her.  Remains tachycardic, unsure if secondary to albuterol use.  Dr. Dalene Seltzer and respiratory therapist came to the bedside and agree that BiPAP is reasonable. [MR]  1413 Patient request that I call her mother at [phone] [MR]  1532 D-dimer, quantitative(!) [MR]  1634 Negative CT scan [MR]  1650 Back pain got better with morphine however she reportedly now some degree of chest pain.  Lower suspicion dissection however I do not currently have any explanation for her respiratory failure.  Will order Ativan for anxiety which may help relax some of her accessory muscles. [MR]    Clinical Course User Index [MR] Kalix Meinecke, Gabriel Cirri, PA-C                           Medical Decision Making Amount and/or Complexity of Data Reviewed Labs: ordered. Decision-making details documented in ED Course. Radiology: ordered.  Risk OTC drugs. Prescription drug management. Decision regarding hospitalization.   This patient presents to the ED for concern of cough, chest pain and back pain.  Differential includes but is not limited to dissection, pulmonary embolus, pneumothorax, ACS, pneumonia, asthma exacerbation, bronchitis, COVID/flu.   This is not an exhaustive differential.    Past Medical History / Co-morbidities / Social History: Asthma and obesity   Additional history: Unable to view visits with PCP   Physical Exam:  Increased work of breathing.  Crying and using accessory muscles.  Cough sounds dry.  Mild amounts of wheezing.  Speaking in complete sentences and supplying her own history.  Lab Tests: I ordered, and personally interpreted labs.  The pertinent results include:  -Normal white blood cell count -Potassium 3.4 -Negative COVID -D-dimer 0.67 -ABG pH 7.5, CO2 23.5   Imaging Studies: I ordered and independently visualized and interpreted chest x-ray which showed no abnormalities. I agree with the radiologist interpretation.  CT PE ordered after elevated D-dimer.  I independently visualized and interpreted this and there are no signs of PE   Cardiac Monitoring:  The patient was maintained on a cardiac monitor.  My attending physician  Schlosshman viewed and interpreted the cardiac monitored which showed an underlying rhythm of: Sinus tach    Medications: I ordered medication including DuoNeb, Toradol, Robitussin, steroids, magnesium. Reevaluation of the patient after these medicines showed that the patient was in respiratory distress.  These medications did not help her symptoms placed on BiPAP at this time.    Requesting something for her back pain.  Vicodin originally ordered but patient unable to  swallow the pill.  Given morphine.  Nursing understands to hold previously ordered Ativan.   Consultations Obtained: I spoke with Dr. Myrla Halsted with critical care who accepts the patient at [facility] long.  Agrees that intubation is not necessary at this time.  Disposition: This is a 36 year old female who presented today with complaint of cough for 2 months and some shortness of breath.  She was ambulatory and only with mild accessory muscle use on arrival.  After first round of medications for asthma exacerbation she appeared to decompensate, becoming very tachypneic and tachycardic.  Decision was made to place the patient on BiPAP.  CT scan was ordered which showed no sign of PE.  At this time I am unsure the exact cause of her failure.  Dissection is still on the differential however lower suspicion of this.  Question panic attack causing tachypnea and hyperventilation.  Regardless, at this time she needs to be transferred to a facility with more resources.  She requires admission to the ICU for respiratory failure, Dr. Katrinka Blazing will accept. Patient and her mother have been made aware and are agreeable to transfer.   I discussed this case with my attending physician Dr. Audley Hose who cosigned this note including patient's presenting symptoms, physical exam, and planned diagnostics and interventions. Attending physician stated agreement with plan or made changes to plan which were implemented.     Final Clinical Impression(s) / ED Diagnoses Final diagnoses:  Acute respiratory failure, unspecified whether with hypoxia or hypercapnia (HCC)    Rx / DC Orders Admit to Dr. Katrinka Blazing with critical care at [facility], [place] A, PA-C 11/24/21 1724  Patient reassessed at 5:50pm.  Resting comfortably with heart rate in the low 90s and respiration rate at 20.  Patient's presentation may have been secondary to anxiety/panic attack now that she is resting comfortably and calm after Ativan.   Woodroe Chen 11/24/21 1755    Cheryll Cockayne, MD 12/08/21 [phone]

## 2021-11-24 NOTE — ED Notes (Signed)
Despite IV meds and HHN, pts WOB is worse, very tachypnea, at times monophasic speech, abd breathing is now noted. ED MD at bedside, order rec to implement BiPAP

## 2021-11-24 NOTE — ED Notes (Signed)
Presents today with increased coughing and shortness of breath, states she has been having this issue since March of this year. Stated she saw her PCP who had a CXR done and told her that her Asthma "flared up", here for evaluation and treatment of asthma.

## 2021-11-24 NOTE — ED Notes (Signed)
Phone Handoff Report provided to Suszanne Conners at [facility] ICU Phone Handoff Report given to [employer]

## 2021-11-24 NOTE — H&P (Signed)
NAME:  Laurie Reed, MRN:  [id number], DOB:  12-29-1985, LOS: 0 ADMISSION DATE:  11/24/2021, CONSULTATION DATE:  11/24/21 REFERRING MD:  Audley Hose [facility] CHIEF COMPLAINT:  Dyspnea  History of Present Illness:  Laurie Reed is a 36 y.o. female who has a PMH as below including but not limited to Asthma, anxiety, pre-diabetes. She presented to [facility] 7/11 with cough, dyspnea, chest tightness. Symptoms initially began March 2023 and persisted. She apparently saw her PCP 2 weeks prior to ED presentation and was told that she was having an asthma "flare up". She was prescribed a Predisone taper and Albuterol inhaler.  In ED, CTA chest was negative for PE. She was treated for asthma exacerbation but then noted to be in respiratory distress; therefore, started on BiPAP.  Due to her distress despite BiPAP, PCCM called for transfer to [facility] ICU.  She has been taking her Symbicort twice daily, but not at scheduled intervals and it does not sound like it has been close to 12 hours apart between doses.  She at baseline infrequently uses albuterol, but has been recently been using about twice per day, in the morning and in the evening.  She has had coughing with posttussive vomiting recently.  Her 2 most recent exacerbations were related to respiratory infections, her most recent being related to COVID about 2 years ago.  She takes Zyrtec at baseline.  She has a history of requiring allergy shots, which she has not been receiving recently.  Her PCP mentioned referring her to allergy immunology, but the referral had not yet been placed.  Pertinent  Medical History:  has Anxiety and depression; Vitamin D deficiency; Asthma; Seasonal allergic rhinitis; Morbid obesity with BMI of 45.0-49.9, adult (HCC); Anemia in pregnancy; Chronic low back pain; and Respiratory failure (HCC) on their problem list.  Significant Hospital Events: Including procedures, antibiotic start and stop dates in addition to other pertinent  events   7/11 admit to [facility] in transfer from [facility]  Interim History / Subjective:  Breathing improved since bipap started.   Objective:  Blood pressure (!) 167/100, pulse 67, temperature 98.8 F (37.1 C), temperature source Oral, resp. rate (!) 38, height 5\' 2"  (1.575 m), weight 125.2 kg, last menstrual period 11/19/2021, SpO2 100 %.    FiO2 (%):  [30 %] 30 %  No intake or output data in the 24 hours ending 11/24/21 1728 Filed Weights   11/24/21 1243  Weight: 125.2 kg    Examination: General: Healthy-appearing young woman sitting up in bed in no acute distress, on BiPAP Neuro: Awake and alert, answering questions appropriately, moving all extremities HEENT: [place]/AT, BiPAP mask in place Cardiovascular: S1-S2, mildly tachycardic, regular rhythm Lungs: Breathing comfortably on BiPAP, no conversational dyspnea.  No observed wheezing.  No accessory muscle use.  Abdomen: Obese, soft, nontender Musculoskeletal: Appropriate muscle mass for age, no cyanosis or edema Skin: Warm, dry, no diffuse rashes  Labs/imaging personally reviewed:  CTA chest 7/11 > neg for acute process.  No pneumonia.  Assessment & Plan:   Acute asthma exacerbation requiring BiPAP - appears to be mainly due to anxiety and work of breathing rather then need for O2 support. While in ED, she received Albuterol, DuoNeb, 2mg  Ativan, 2g Mag, 125mg  Solumerdol, 4mg  Morphine. Moderate persistent asthma, allergic asthma - Continue BiPAP; okay to take breaks -Singulair, Zyrtec -Steroids - Bronchodilators-Brovana, Yupelri, Pulmicort plus DuoNebs as needed.  Can transition back to Symbicort as an outpatient.  Can also add LAMA if needed. - Continue low  dose anxiolytics. -Does not require antibiotics. -RVP - Check PCT for completeness. -We will need outpatient follow-up with allergy (had been planned as an outpatient but referral was never placed) and [facility].  GERD - Start H2 blocker twice daily - Long-term would  benefit from modest weight loss  Chronic anemia - Medication for transfusion - Continue oral iron supplements  Hypokalemia -Replete -Continue to monitor  Hx Depression, Anxiety, Migraines. - When able to safely take PO then can resume home Amitriptyline, Duloxetine, Sumatriptan, Tizanadine, Topiramate.  Chest and back pain, likely due to significant coughing recently - Acetaminophen, lidocaine patch, cough suppression  Best practice (evaluated daily):  Diet/type: NPO w/ oral meds DVT prophylaxis: prophylactic heparin  GI prophylaxis: H2B Lines: N/A Foley:  N/A Code Status:  full code Last date of multidisciplinary goals of care discussion: Patient and mother updated at bedside  Labs   CBC: Recent Labs  Lab 11/24/21 1313 11/24/21 1636  WBC 10.3  --   NEUTROABS 5.0  --   HGB 11.4* 11.9*  HCT 36.0 35.0*  MCV 80.4  --   PLT 371  --     Basic Metabolic Panel: Recent Labs  Lab 11/24/21 1313 11/24/21 1636  NA 137 139  K 3.4* 2.9*  CL 106  --   CO2 24  --   GLUCOSE 86  --   BUN 10  --   CREATININE 0.82  --   CALCIUM 8.9  --    GFR: Estimated Creatinine Clearance: 122.2 mL/min (by C-G formula based on SCr of 0.82 mg/dL). Recent Labs  Lab 11/24/21 1313  WBC 10.3    Liver Function Tests: No results for input(s): "AST", "ALT", "ALKPHOS", "BILITOT", "PROT", "ALBUMIN" in the last 168 hours. No results for input(s): "LIPASE", "AMYLASE" in the last 168 hours. No results for input(s): "AMMONIA" in the last 168 hours.  ABG    Component Value Date/Time   PHART 7.539 (H) 11/24/2021 1636   PCO2ART 23.5 (L) 11/24/2021 1636   PO2ART 166 (H) 11/24/2021 1636   HCO3 20.1 11/24/2021 1636   TCO2 21 (L) 11/24/2021 1636   ACIDBASEDEF 1.0 11/24/2021 1636   O2SAT 100 11/24/2021 1636     Coagulation Profile: No results for input(s): "INR", "PROTIME" in the last 168 hours.  Cardiac Enzymes: No results for input(s): "CKTOTAL", "CKMB", "CKMBINDEX", "TROPONINI" in the  last 168 hours.  HbA1C: Hemoglobin A1C  Date/Time Value Ref Range Status  11/22/2018 01:34 PM 5.7 (A) 4.0 - 5.6 % Final   Hgb A1c MFr Bld  Date/Time Value Ref Range Status  01/02/2020 12:13 PM 5.8 (H) 4.8 - 5.6 % Final    Comment:             Prediabetes: 5.7 - 6.4          Diabetes: >6.4          Glycemic control for adults with diabetes: <7.0   01/20/2018 09:07 AM 5.3 4.8 - 5.6 % Final    Comment:             Prediabetes: 5.7 - 6.4          Diabetes: >6.4          Glycemic control for adults with diabetes: <7.0     CBG: No results for input(s): "GLUCAP" in the last 168 hours.  Review of Systems:   Review of Systems  Constitutional:  Negative for chills and fever.  Respiratory:  Positive for cough, shortness of breath and wheezing.  Negative for sputum production.   Cardiovascular:  Positive for chest pain. Negative for leg swelling.  Gastrointestinal:  Positive for heartburn and vomiting.  Musculoskeletal:  Positive for back pain.  Skin:  Negative for rash.  Neurological:  Negative for weakness.  Endo/Heme/Allergies:  Positive for environmental allergies.     Past Medical History:  She,  has a past medical history of Acid reflux (2004), Anemia, Anxiety (2008), Asthma (1993), Carpal tunnel syndrome, Chlamydia, Depression (2008), Environmental allergies, GERD (gastroesophageal reflux disease), Migraines, Miscarriage, Ovarian cyst, Pre-diabetes, Ulcer of the stomach and intestine (2004), and Urinary tract infection.   Surgical History:   Past Surgical History:  Procedure Laterality Date   CARPAL TUNNEL RELEASE Left    COLONOSCOPY  2004   IRRIGATION AND DEBRIDEMENT SEBACEOUS CYST N/A 06/01/2017   Procedure: EXCISION SEBACEOUS CYST ON SCALP;  Surgeon: Ancil Linseyavis, Jason Evan, MD;  Location: [facility] ORS;  Service: General;  Laterality: N/A;   MULTIPLE TOOTH EXTRACTIONS  2015   6 teeth removed    TUBAL LIGATION N/A 04/04/2018   Procedure: POST PARTUM TUBAL LIGATION;  Surgeon:  Levie HeritageStinson, Jacob J, DO;  Location: [facility];  Service: Gynecology;  Laterality: N/A;   WISDOM TOOTH EXTRACTION  2007     Social History:   reports that she has never smoked. She has never used smokeless tobacco. She reports that she does not drink alcohol and does not use drugs.   Family History:  Her family history includes Asthma in her maternal aunt, maternal uncle, and mother; Cancer in her paternal aunt; Clotting disorder in her mother; Diabetes in her maternal aunt, maternal grandfather, maternal grandmother, maternal uncle, maternal uncle, maternal uncle, and mother; Heart disease in her maternal grandfather, maternal grandmother, maternal uncle, and mother; Hypertension in her maternal aunt, maternal grandfather, maternal grandmother, maternal uncle, mother, and paternal grandmother. There is no history of Hypotension, Anesthesia problems, Malignant hyperthermia, or Pseudochol deficiency.   Allergies Allergies  Allergen Reactions   Latex Hives and Itching   Other Itching    "20 different types of trees"   Ondansetron Itching   Tree Extract Itching   Zofran Itching     Home Medications  Prior to Admission medications   Medication Sig Start Date End Date Taking? Authorizing Provider  acetaminophen (TYLENOL) 500 MG tablet Take 500-1,000 mg by mouth every 6 (six) hours as needed for mild pain, moderate pain or headache (or migraines).     [provider]  albuterol (VENTOLIN HFA) 108 (90 Base) MCG/ACT inhaler Inhale 2 puffs into the lungs 4 (four) times daily as needed. 04/08/21     albuterol (PROVENTIL) (2.5 MG/3ML) 0.083% nebulizer solution Take 3 mLs (2.5 mg total) by nebulization every 6 (six) hours as needed for wheezing or shortness of breath. 10/14/19   Moshe CiproMatthews, Stephanie, NP  albuterol (PROVENTIL) (2.5 MG/3ML) 0.083% nebulizer solution USE 1 VIAL EVERY 6 HOURS AND AS NEEDED DIAGNOSIS: ASTHMA: J44.9 03/12/20 03/12/21  Fleet ContrasAvbuere, Edwin, MD  albuterol (PROVENTIL)  (2.5 MG/3ML) 0.083% nebulizer solution Use 1 vial via nebulizer every 6 hours and as needed 05/15/21     albuterol (VENTOLIN HFA) 108 (90 Base) MCG/ACT inhaler Inhale 1-2 puffs into the lungs every 6 (six) hours as needed for wheezing or shortness of breath. Patient not taking: Reported on 04/13/2021    [provider]  albuterol (VENTOLIN HFA) 108 (90 Base) MCG/ACT inhaler INHALE 2 PUFFS INTO THE LUNGS EVERY 4 (FOUR) HOURS AS NEEDED FOR WHEEZING OR SHORTNESS OF BREATH. 05/12/20 05/12/21  Evern Core, PA-C  albuterol (VENTOLIN HFA) 108 (90 Base) MCG/ACT inhaler Inhale 2 puffs into the lungs 4 (four) times daily as needed. 05/15/21     amitriptyline (ELAVIL) 25 MG tablet Take 1 tablet (25 mg total) by mouth at bedtime as needed. 09/16/21     cetirizine (ZYRTEC) 10 MG tablet Take 1 tablet (10 mg total) by mouth daily. Prn itching 01/02/20   Anders Simmonds, PA-C  cetirizine (ZYRTEC) 10 MG tablet TAKE 1 TABLET BY MOUTH EVERY EVENING AS NEEDED FOR ALLERGIES 06/06/20 06/06/21  Fleet Contras, MD  cetirizine (ZYRTEC) 10 MG tablet Take 1 tablet by mouth every evening as needed for allergies 09/12/20     dextromethorphan-guaiFENesin (MUCINEX DM) 30-600 MG 12hr tablet Take 1 tablet by mouth 2 (two) times daily. Patient not taking: Reported on 04/13/2021 05/29/20   Eustace Moore, MD  diphenhydrAMINE (BENADRYL) 25 mg capsule Take 25-50 mg by mouth 2 (two) times daily as needed for allergies.    [provider]  DULoxetine (CYMBALTA) 30 MG capsule Take 1 capsule (30 mg total) by mouth daily. 12/14/19 01/13/20  Claiborne Rigg, NP  escitalopram (LEXAPRO) 10 MG tablet Take 1 tablet (10 mg total) by mouth daily. Patient not taking: Reported on 10/05/2019 06/27/19   Cain Saupe, MD  ferrous sulfate 324 (65 Fe) MG TBEC 1 pill daily after meal to help with anemia 11/22/18   [name], [name], MD  fexofenadine (ALLEGRA) 180 MG tablet Take 180 mg by mouth daily as needed for allergies or rhinitis.      [provider]  fluconazole (DIFLUCAN) 150 MG tablet Take 1 tablet by mouth every week as needed 10/27/20     fluconazole (DIFLUCAN) 150 MG tablet Take 1 tablet (150 mg total) by mouth once a week as needed 07/07/21     Fluocinolone Acetonide Body 0.01 % OIL Apply as directed 09/12/20     fluticasone (FLONASE) 50 MCG/ACT nasal spray Place 1 spray into both nostrils daily.    [provider]  fluticasone (FLONASE) 50 MCG/ACT nasal spray Use 2 sprays in each nostril once daily 09/12/20     HYDROcodone-acetaminophen (NORCO/VICODIN) 5-325 MG tablet Take 1 tablet by mouth every 6 hours for 7 days. Patient not taking: Reported on 10/11/2020 08/28/20     HYDROcodone-acetaminophen (NORCO/VICODIN) 5-325 MG tablet Take 1 tablet every 6 hours by mouth for 7 days. Patient not taking: Reported on 10/11/2020 09/08/20     hydrocortisone 2.5 % cream Apply 2 times daily as needed for bumps 05/15/21     hydrOXYzine (ATARAX/VISTARIL) 10 MG tablet Take 1 tablet (10 mg total) by mouth every 6 (six) hours as needed for itching. 12/16/19   Claiborne Rigg, NP  meloxicam (MOBIC) 15 MG tablet Take 1 tablet (15 mg total) by mouth daily. Patient not taking: Reported on 10/11/2020 01/02/20   Anders Simmonds, PA-C  methylPREDNISolone (MEDROL) 4 MG TBPK tablet Take as directed 11/06/21     metroNIDAZOLE (FLAGYL) 500 MG tablet Take 1 tablet (500 mg total) by mouth 2 (two) times daily. Patient not taking: Reported on 04/13/2021 10/27/20     metroNIDAZOLE (FLAGYL) 500 MG tablet Take 1 tablet (500 mg total) by mouth 2 (two) times daily. 10/28/21     montelukast (SINGULAIR) 10 MG tablet Take 1 tablet (10 mg total) by mouth at bedtime. Patient not taking: Reported on 04/13/2021 10/14/19   Moshe Cipro, NP  omeprazole (PRILOSEC) 40 MG capsule TAKE 1 CAPSULE (40 MG TOTAL) BY MOUTH  DAILY. TO REDUCE STOMACH ACID 03/21/20 03/21/21  Esterwood, Amy S, PA-C  oxyCODONE-acetaminophen (PERCOCET/ROXICET) 5-325 MG tablet Take 1  tablet by mouth every 6 hours for 7 days. Patient not taking: Reported on 04/13/2021 11/20/20     oxyCODONE-acetaminophen (PERCOCET/ROXICET) 5-325 MG tablet Take 1 tablet by mouth 2 (two) times daily as needed. 11/18/21     phentermine (ADIPEX-P) 37.5 MG tablet Take 37.5 mg by mouth at bedtime. Patient not taking: Reported on 04/13/2021 03/13/20   [provider]  phentermine (ADIPEX-P) 37.5 MG tablet TAKE 1 TABLET BY MOUTH EVERY MORNING 06/06/20 12/03/20  Fleet Contras, MD  phentermine (ADIPEX-P) 37.5 MG tablet TAKE ONE TABLET BY MOUTH EVERY MORNING 03/12/20 09/08/20  Fleet Contras, MD  phentermine (ADIPEX-P) 37.5 MG tablet Take 1 tablet by mouth every morning 09/12/20     phentermine (ADIPEX-P) 37.5 MG tablet Take 1 tablet (37.5 mg total) by mouth every morning. 07/20/21     phentermine (ADIPEX-P) 37.5 MG tablet Take 1 tablet (37.5 mg total) by mouth in the morning. 10/14/21     predniSONE (DELTASONE) 20 MG tablet Take 1 tablet (20 mg total) by mouth 2 (two) times daily with a meal. Patient not taking: Reported on 04/13/2021 05/29/20   Eustace Moore, MD  PROAIR HFA 108 [phone] Base) MCG/ACT inhaler INHALE 2 PUFFS INTO THE LUNGS 4 TIMES DAILY AS NEEDED 03/12/20 03/12/21  Fleet Contras, MD  promethazine (PHENERGAN) 25 MG tablet Take 1 tablet (25 mg total) by mouth every 6 (six) hours as needed for nausea or vomiting. 03/10/21   Tomi Bamberger, PA-C  promethazine (PHENERGAN) 25 MG tablet Take 1 tablet (25 mg total) by mouth every 6 (six) hours as needed. 09/09/21     promethazine-dextromethorphan (PROMETHAZINE-DM) 6.25-15 MG/5ML syrup Take 10 mLs by mouth 2 (two) times daily as needed for cough and congestion 11/11/21     SUMAtriptan (IMITREX) 100 MG tablet Take 1 tablet by mouth at onset of migraine symptoms, may repeat in 1-2 hours if persistent. 10/27/20     SUMAtriptan (IMITREX) 50 MG tablet Take 1 tablet (50 mg total) by mouth once as needed (at the onset of a migraine- may repeat once in 2  hours if headache persists or recurs). Patient not taking: Reported on 04/13/2021 11/22/18   Hoy Register, MD  SYMBICORT 160-4.5 MCG/ACT inhaler Inhale 2 puffs into the lungs 2 (two) times daily. 08/14/21     tiZANidine (ZANAFLEX) 4 MG tablet Take 1 tablet by mouth twice daily as needed 09/12/20     topiramate (TOPAMAX) 100 MG tablet Take 1 tablet by mouth once a day 10/27/20     topiramate (TOPAMAX) 50 MG tablet Take 1 tablet (50 mg total) by mouth 2 (two) times daily. 11/22/18   Hoy Register, MD  triamcinolone cream (KENALOG) 0.1 % Apply 1 application topically 2 (two) times daily as needed. 07/13/21     triamcinolone cream (KENALOG) 0.1 % Apply 1 application topically 2 (two) times daily as needed. 07/13/21     Vitamin D, Ergocalciferol, (DRISDOL) 1.25 MG (50000 UNIT) CAPS capsule Take 1 capsule (50,000 Units total) by mouth once a week. 07/21/21     dicyclomine (BENTYL) 10 MG capsule Take 1 capsule (10 mg total) by mouth 4 (four) times daily -  before meals and at bedtime. For stomach cramping Patient not taking: Reported on 10/25/2019 06/13/19 10/25/19  [name], Hewitt Shorts, MD  sucralfate (CARAFATE) 1 GM/10ML suspension Take 10 mLs (1 g total) by mouth 4 (four) times daily -  with meals and at bedtime. Patient not taking: Reported on 10/25/2019 06/13/19 10/25/19  Cain Saupe, MD     Critical care time: 50 min.    Steffanie Dunn, DO 11/24/21 10:14 PM [facility]

## 2021-11-24 NOTE — Progress Notes (Signed)
eLink Physician-Brief Progress Note Patient Name: Laurie Reed DOB: July 30, 1985 MRN: [id number]   Date of Service  11/24/2021  HPI/Events of Note  Patient c/o back pain. Currently on BiPAP for acute asthma exacerbation. Reluctant to Rx with IV narcotics given respiratory status.   eICU Interventions  Plan: Toradol 15 mg IV X 1 now.     Intervention Category Major Interventions: Other:  Lenell Antu 11/24/2021, 9:47 PM

## 2021-11-24 NOTE — ED Notes (Signed)
[facility] transport at bedside

## 2021-11-24 NOTE — ED Triage Notes (Signed)
Cough, chest pain and emesis x 1.

## 2021-11-24 NOTE — ED Notes (Signed)
Family at bedside. Transfer information discussed with patient and family.

## 2021-11-24 NOTE — ED Notes (Signed)
Patient transported to CT with nursing and rt

## 2021-11-24 NOTE — ED Notes (Signed)
BiPAP at IPaP 15/ EPaP 5/ FiO2 .3/ rate 10/min

## 2021-11-24 NOTE — ED Notes (Signed)
Transport to/from radiology on Bipap and RT at bedside.

## 2021-11-25 DIAGNOSIS — R0600 Dyspnea, unspecified: Secondary | ICD-10-CM

## 2021-11-25 LAB — RESPIRATORY PANEL BY PCR

## 2021-11-25 LAB — BASIC METABOLIC PANEL
Anion gap: 11 (ref 5–15)
BUN: 13 mg/dL (ref 6–20)
CO2: 19 mmol/L — ABNORMAL LOW (ref 22–32)
Calcium: 9.1 mg/dL (ref 8.9–10.3)
Chloride: 110 mmol/L (ref 98–111)
Creatinine, Ser: 0.75 mg/dL (ref 0.44–1.00)
GFR, Estimated: >60 mL/min (ref >60)
Glucose, Bld: 122 mg/dL — ABNORMAL HIGH (ref 70–99)
Potassium: 3.7 mmol/L (ref 3.5–5.1)
Sodium: 140 mmol/L (ref 135–145)

## 2021-11-25 LAB — CBC
HCT: 38 % (ref 36.0–46.0)
Hemoglobin: 11.9 g/dL — ABNORMAL LOW (ref 12.0–15.0)
MCH: 25.9 pg — ABNORMAL LOW (ref 26.0–34.0)
MCHC: 31.3 g/dL (ref 30.0–36.0)
MCV: 82.6 fL (ref 80.0–100.0)
Platelets: 368 10*3/uL (ref 150–400)
RBC: 4.6 MIL/uL (ref 3.87–5.11)
RDW: 14.7 % (ref 11.5–15.5)
WBC: 11.3 10*3/uL — ABNORMAL HIGH (ref 4.0–10.5)
nRBC: 0 % (ref 0.0–0.2)

## 2021-11-25 LAB — PHOSPHORUS: Phosphorus: 3.3 mg/dL (ref 2.5–4.6)

## 2021-11-25 LAB — MAGNESIUM: Magnesium: 2.2 mg/dL (ref 1.7–2.4)

## 2021-11-25 LAB — PROCALCITONIN: Procalcitonin: <0.1 ng/mL

## 2021-11-25 MED ORDER — METHYLPREDNISOLONE SODIUM SUCC 125 MG IJ SOLR
60.0000 mg | Freq: Two times a day (BID) | INTRAMUSCULAR | Status: DC
  Administered 2021-11-25 (×2): 60 mg via INTRAVENOUS
  Filled 2021-11-25 (×2): qty 2

## 2021-11-25 MED ORDER — TOPIRAMATE 25 MG PO TABS
50.0000 mg | ORAL_TABLET | Freq: Two times a day (BID) | ORAL | Status: DC
  Administered 2021-11-25 – 2021-11-26 (×2): 50 mg via ORAL
  Filled 2021-11-25 (×5): qty 2

## 2021-11-25 MED ORDER — SODIUM CHLORIDE 0.9 % IV SOLN
100.0000 mg | Freq: Two times a day (BID) | INTRAVENOUS | Status: DC
  Administered 2021-11-25 – 2021-11-26 (×4): 100 mg via INTRAVENOUS
  Filled 2021-11-25 (×5): qty 100

## 2021-11-25 MED ORDER — KETOROLAC TROMETHAMINE 15 MG/ML IJ SOLN: 15.0000 mg | Freq: Once | INTRAMUSCULAR | Status: DC

## 2021-11-25 MED ORDER — KETOROLAC TROMETHAMINE 15 MG/ML IJ SOLN
INTRAMUSCULAR | Status: AC
  Filled 2021-11-25: qty 1

## 2021-11-25 MED ORDER — FENTANYL CITRATE PF 50 MCG/ML IJ SOSY
25.0000 ug | PREFILLED_SYRINGE | INTRAMUSCULAR | Status: DC | PRN
  Administered 2021-11-25: 25 ug via INTRAVENOUS
  Filled 2021-11-25: qty 1

## 2021-11-25 MED ORDER — PHENTERMINE HCL 37.5 MG PO TABS: 37.5000 mg | ORAL_TABLET | Freq: Every morning | ORAL | Status: DC

## 2021-11-25 MED ORDER — FENTANYL CITRATE PF 50 MCG/ML IJ SOSY
25.0000 ug | PREFILLED_SYRINGE | INTRAMUSCULAR | Status: DC | PRN
  Administered 2021-11-26 (×2): 25 ug via INTRAVENOUS
  Filled 2021-11-25 (×3): qty 1

## 2021-11-25 MED ORDER — DEXTROSE IN LACTATED RINGERS 5 % IV SOLN: INTRAVENOUS | Status: DC

## 2021-11-25 MED ORDER — HYDROCODONE-ACETAMINOPHEN 5-325 MG PO TABS
1.0000 | ORAL_TABLET | ORAL | Status: DC | PRN
  Administered 2021-11-26 – 2021-11-27 (×4): 2 via ORAL
  Filled 2021-11-25 (×4): qty 2

## 2021-11-25 MED ORDER — POTASSIUM CHLORIDE 10 MEQ/100ML IV SOLN
10.0000 meq | INTRAVENOUS | Status: DC
  Filled 2021-11-25: qty 100

## 2021-11-25 MED ORDER — HEPARIN SODIUM (PORCINE) 5000 UNIT/ML IJ SOLN
5000.0000 [IU] | Freq: Three times a day (TID) | INTRAMUSCULAR | Status: DC
  Administered 2021-11-25 – 2021-11-27 (×7): 5000 [IU] via SUBCUTANEOUS
  Filled 2021-11-25 (×7): qty 1

## 2021-11-25 NOTE — Progress Notes (Signed)
NAME:  [name], MRN:  [id number], DOB:  26-Nov-1985, LOS: 1 ADMISSION DATE:  11/24/2021, CONSULTATION DATE:  11/24/21 REFERRING MD:  Audley Hose [facility] CHIEF COMPLAINT:  Dyspnea  History of Present Illness:  Laurie Reed is a 36 y.o. female who has a PMH as below including but not limited to Asthma, anxiety, pre-diabetes. She presented to [facility] 7/11 with cough, dyspnea, chest tightness. Symptoms initially began March 2023 and persisted. She apparently saw her PCP 2 weeks prior to ED presentation and was told that she was having an asthma "flare up". She was prescribed a Predisone taper and Albuterol inhaler.  In ED, CTA chest was negative for PE. She was treated for asthma exacerbation but then noted to be in respiratory distress; therefore, started on BiPAP.  Due to her distress despite BiPAP, PCCM called for transfer to [facility] ICU.  She has been taking her Symbicort twice daily, but not at scheduled intervals and it does not sound like it has been close to 12 hours apart between doses.  She at baseline infrequently uses albuterol, but has been recently been using about twice per day, in the morning and in the evening.  She has had coughing with posttussive vomiting recently.  Her 2 most recent exacerbations were related to respiratory infections, her most recent being related to COVID about 2 years ago.  She takes Zyrtec at baseline.  She has a history of requiring allergy shots, which she has not been receiving recently.  Her PCP mentioned referring her to allergy immunology, but the referral had not yet been placed.  Pertinent  Medical History:  has Anxiety and depression; Vitamin D deficiency; Asthma; Seasonal allergic rhinitis; Morbid obesity with BMI of 45.0-49.9, adult (HCC); Anemia in pregnancy; Chronic low back pain; and Respiratory failure (HCC) on their problem list.  Significant Hospital Events: Including procedures, antibiotic start and stop dates in addition to other pertinent  events   7/11 admit to [facility] in transfer from [facility]  Interim History / Subjective:  Remained on BiPAP overnight. Had some back pain, received Toradol x 2. Sleepy this AM but arouses to voice and follows intermittent basic commands.  Objective:  Blood pressure 130/89, pulse 90, temperature 97.6 F (36.4 C), temperature source Axillary, resp. rate (!) 37, height 5\' 2"  (1.575 m), weight 125.2 kg, last menstrual period 11/19/2021, SpO2 100 %.    FiO2 (%):  [30 %] 30 %   Intake/Output Summary (Last 24 hours) at 11/25/2021 0726 Last data filed at 11/25/2021 0542 Gross per 24 hour  Intake 47.28 ml  Output --  Net 47.28 ml   Filed Weights   11/24/21 1243  Weight: 125.2 kg    Examination: General: Healthy-appearing young woman resting comfortably, on BiPAP Neuro: Sleepy but arouses to voice and follows basic commands HEENT: [place]/AT, BiPAP mask in place Cardiovascular: RRR, no M/R/G Lungs: Breathing comfortably on BiPAP, CTAB Abdomen: Obese, soft, nontender Musculoskeletal: Appropriate muscle mass for age, no cyanosis or edema Skin: Warm, dry, no diffuse rashes  Labs/imaging personally reviewed:  CTA chest 7/11 > neg for acute process.  No pneumonia.  Assessment & Plan:   Acute asthma exacerbation requiring BiPAP - appears to be mainly due to anxiety and work of breathing rather then need for O2 support. While in ED, she received Albuterol, DuoNeb, 2mg  Ativan, 2g Mag, 125mg  Solumerdol, 4mg  Morphine. RVP and PCT negative. Moderate persistent asthma, allergic asthma - Will give a break off BiPAP this AM. - Continue supplemental O2 as needed to  maintain SpO2 > 92%. - Singulair, Zyrtec - Steroids - Bronchodilators - Brovana, Yupelri, Pulmicort plus DuoNebs as needed.  Can transition back to Symbicort as an outpatient.  Can also add LAMA if needed. - Continue low dose anxiolytics. - Does not require antibiotics. - We will need outpatient follow-up with allergy (had been planned as an  outpatient but referral was never placed) and [facility].  GERD - Continue H2 blocker twice daily - Long-term would benefit from modest weight loss  Chronic anemia - Medication for transfusion - Continue oral iron supplements  Hypokalemia - per labs 7/11, PO repletion ordered but never given. - STAT BMP now, if remains low can supplement with PO K once off BiPAP  Hx Depression, Anxiety, Migraines. - Resume home Amitriptyline, Duloxetine, Sumatriptan, Tizanadine, Topiramate.  Chest and back pain, likely due to significant coughing recently - Acetaminophen, lidocaine patch, cough suppression  Best practice (evaluated daily):  Diet/type: Regular consistency (see orders) DVT prophylaxis: prophylactic heparin  GI prophylaxis: H2B Lines: N/A Foley:  N/A Code Status:  full code Last date of multidisciplinary goals of care discussion: Patient and mother updated at bedside on admission 7/11.   Rutherford Guys, PA - [facility] For pager details, please see AMION or use Epic chat  After 1900, please call [facility] for cross coverage needs 11/25/2021, 7:35 AM

## 2021-11-25 NOTE — Progress Notes (Addendum)
eLink Physician-Brief Progress Note Patient Name: Laurie Reed DOB: Feb 24, 1986 MRN: [id number]   Date of Service  11/25/2021  HPI/Events of Note  Patient again c/o back pain. Last creatinine = 0.82.  eICU Interventions  Plan: Toradol 15 mg IV x 1 now.      Intervention Category Major Interventions: Other:  Lenell Antu 11/25/2021, 6:44 AM

## 2021-11-25 NOTE — Plan of Care (Signed)
  Problem: Clinical Measurements: Goal: Respiratory complications will improve Outcome: Progressing   Problem: Activity: Goal: Risk for activity intolerance will decrease Outcome: Progressing   Problem: Coping: Goal: Level of anxiety will decrease Outcome: Progressing   Problem: Clinical Measurements: Goal: Cardiovascular complication will be avoided Outcome: Not Progressing   Problem: Pain Managment: Goal: General experience of comfort will improve Outcome: Not Progressing

## 2021-11-25 NOTE — TOC Initial Note (Signed)
Transition of Care [facility]) - Initial/Assessment Note    Patient Details  Name: Laurie Reed MRN: [id number] Date of Birth: July 22, 1985  Transition of Care [facility]) CM/SW Contact:    Golda Acre, RN Phone Number: 11/25/2021, 7:58 AM  Clinical Narrative:                  Transition of Care [facility]) Screening Note   Patient Details  Name: Laurie Reed Date of Birth: May 11, 1986   Transition of Care [facility]) CM/SW Contact:    Golda Acre, RN Phone Number: 11/25/2021, 7:58 AM    Transition of Care Department [facility]) has reviewed patient and no TOC needs have been identified at this time. We will continue to monitor patient advancement through interdisciplinary progression rounds. If new patient transition needs arise, please place a TOC consult.    Expected Discharge Plan: Home/Self Care Barriers to Discharge: Continued Medical Work up   Patient Goals and CMS Choice Patient states their goals for this hospitalization and ongoing recovery are:: on bipaP      Expected Discharge Plan and Services Expected Discharge Plan: Home/Self Care   Discharge Planning Services: CM Consult   Living arrangements for the past 2 months: Single Family Home                                      Prior Living Arrangements/Services Living arrangements for the past 2 months: Single Family Home Lives with:: Self Patient language and need for interpreter reviewed:: Yes              Criminal Activity/Legal Involvement Pertinent to Current Situation/Hospitalization: No - Comment as needed  Activities of Daily Living Home Assistive Devices/Equipment: None ADL Screening (condition at time of admission) Patient's cognitive ability adequate to safely complete daily activities?: Yes Is the patient deaf or have difficulty hearing?: No Does the patient have difficulty seeing, even when wearing glasses/contacts?: No Does the patient have difficulty concentrating, remembering,  or making decisions?: No Patient able to express need for assistance with ADLs?: Yes Does the patient have difficulty dressing or bathing?: No Independently performs ADLs?: Yes (appropriate for developmental age) Does the patient have difficulty walking or climbing stairs?: No Weakness of Legs: None Weakness of Arms/Hands: None  Permission Sought/Granted                  Emotional Assessment Appearance:: Appears stated age Attitude/Demeanor/Rapport: Unable to Assess Affect (typically observed): Unable to Assess Orientation: : Oriented to Self, Oriented to Place, Oriented to  Time, Oriented to Situation Alcohol / Substance Use: Never Used Psych Involvement: No (comment)  Admission diagnosis:  Respiratory failure (HCC) [J96.90] Acute respiratory failure, unspecified whether with hypoxia or hypercapnia (HCC) [J96.00] Patient Active Problem List   Diagnosis Date Noted   Respiratory failure (HCC) 11/24/2021   Chronic low back pain 12/26/2019   Anemia in pregnancy 01/23/2018   Morbid obesity with BMI of 45.0-49.9, adult (HCC) 10/19/2017   Seasonal allergic rhinitis 11/01/2016   Asthma 06/30/2015   Vitamin D deficiency 01/22/2015   Anxiety and depression 01/21/2015   PCP:  Fleet Contras, MD Pharmacy:   [facility] [phone] - [place], [place] - [address] AT [facility] [address] [place] [address] Phone: [phone] Fax: [phone]     Social Determinants of Health (SDOH) Interventions    Readmission Risk Interventions     No data  to display

## 2021-11-26 ENCOUNTER — Telehealth: Payer: Self-pay | Admitting: Acute Care

## 2021-11-26 DIAGNOSIS — J9601 Acute respiratory failure with hypoxia: Secondary | ICD-10-CM | POA: Diagnosis not present

## 2021-11-26 DIAGNOSIS — E876 Hypokalemia: Secondary | ICD-10-CM | POA: Diagnosis not present

## 2021-11-26 LAB — BASIC METABOLIC PANEL
Anion gap: 6 (ref 5–15)
BUN: 19 mg/dL (ref 6–20)
CO2: 22 mmol/L (ref 22–32)
Calcium: 8.5 mg/dL — ABNORMAL LOW (ref 8.9–10.3)
Chloride: 111 mmol/L (ref 98–111)
Creatinine, Ser: 0.78 mg/dL (ref 0.44–1.00)
GFR, Estimated: >60 mL/min (ref >60)
Glucose, Bld: 160 mg/dL — ABNORMAL HIGH (ref 70–99)
Potassium: 4.7 mmol/L (ref 3.5–5.1)
Sodium: 139 mmol/L (ref 135–145)

## 2021-11-26 LAB — CBC
HCT: 35.6 % — ABNORMAL LOW (ref 36.0–46.0)
Hemoglobin: 11 g/dL — ABNORMAL LOW (ref 12.0–15.0)
MCH: 25.4 pg — ABNORMAL LOW (ref 26.0–34.0)
MCHC: 30.9 g/dL (ref 30.0–36.0)
MCV: 82.2 fL (ref 80.0–100.0)
Platelets: 357 10*3/uL (ref 150–400)
RBC: 4.33 MIL/uL (ref 3.87–5.11)
RDW: 15 % (ref 11.5–15.5)
WBC: 13.7 10*3/uL — ABNORMAL HIGH (ref 4.0–10.5)
nRBC: 0 % (ref 0.0–0.2)

## 2021-11-26 LAB — MAGNESIUM: Magnesium: 2.2 mg/dL (ref 1.7–2.4)

## 2021-11-26 LAB — PHOSPHORUS: Phosphorus: 2.1 mg/dL — ABNORMAL LOW (ref 2.5–4.6)

## 2021-11-26 MED ORDER — DOCUSATE SODIUM 100 MG PO CAPS
100.0000 mg | ORAL_CAPSULE | Freq: Every day | ORAL | Status: DC | PRN
  Administered 2021-11-26: 100 mg via ORAL
  Filled 2021-11-26: qty 1

## 2021-11-26 MED ORDER — METHYLPREDNISOLONE SODIUM SUCC 40 MG IJ SOLR
40.0000 mg | Freq: Two times a day (BID) | INTRAMUSCULAR | Status: DC
  Administered 2021-11-26 (×2): 40 mg via INTRAVENOUS
  Filled 2021-11-26 (×3): qty 1

## 2021-11-26 MED ORDER — FERROUS SULFATE 325 (65 FE) MG PO TABS
325.0000 mg | ORAL_TABLET | Freq: Every day | ORAL | Status: DC
Start: 2021-11-26 — End: 2021-11-27
  Administered 2021-11-26 – 2021-11-27 (×2): 325 mg via ORAL
  Filled 2021-11-26: qty 1

## 2021-11-26 NOTE — Progress Notes (Signed)
NAME:  Laurie Reed, MRN:  [id number], DOB:  [birth year], LOS: 2 ADMISSION DATE:  11/24/2021, CONSULTATION DATE:  11/24/21 REFERRING MD:  Audley Hose [facility] CHIEF COMPLAINT:  Dyspnea  History of Present Illness:  Laurie Reed is a 36 y.o. female who has a PMH as below including but not limited to Asthma, anxiety, pre-diabetes. She presented to [facility] 7/11 with cough, dyspnea, chest tightness. Symptoms initially began March 2023 and persisted. She apparently saw her PCP 2 weeks prior to ED presentation and was told that she was having an asthma "flare up". She was prescribed a Predisone taper and Albuterol inhaler.  In ED, CTA chest was negative for PE. She was treated for asthma exacerbation but then noted to be in respiratory distress; therefore, started on BiPAP.  Due to her distress despite BiPAP, PCCM called for transfer to [facility] ICU.  She has been taking her Symbicort twice daily, but not at scheduled intervals and it does not sound like it has been close to 12 hours apart between doses.  She at baseline infrequently uses albuterol, but has been recently been using about twice per day, in the morning and in the evening.  She has had coughing with posttussive vomiting recently.  Her 2 most recent exacerbations were related to respiratory infections, her most recent being related to COVID about 2 years ago.  She takes Zyrtec at baseline.  She has a history of requiring allergy shots, which she has not been receiving recently.  Her PCP mentioned referring her to allergy immunology, but the referral had not yet been placed.  Pertinent  Medical History:  has Anxiety and depression; Vitamin D deficiency; Asthma; Seasonal allergic rhinitis; Morbid obesity with BMI of 45.0-49.9, adult (HCC); Anemia in pregnancy; Chronic low back pain; and Respiratory failure (HCC) on their problem list.  Significant Hospital Events: Including procedures, antibiotic start and stop dates in addition to other pertinent  events   7/11 admit to [facility] in transfer from [facility] 7/13 Off BIPAP and supplemental oxygen this am, continues to report chest and back pain   Interim History / Subjective:  Awake and alert this am, preparing to eat breakfast, continues to report chest and back pain  Objective:  Blood pressure 118/63, pulse 78, temperature 98.2 F (36.8 C), temperature source Oral, resp. rate 18, height 5\' 2"  (1.575 m), weight 125.2 kg, last menstrual period 11/19/2021, SpO2 100 %.    FiO2 (%):  [30 %] 30 %   Intake/Output Summary (Last 24 hours) at 11/26/2021 11/28/2021 Last data filed at 11/26/2021 0307 Gross per 24 hour  Intake 1230.57 ml  Output 800 ml  Net 430.57 ml    Filed Weights   11/24/21 1243  Weight: 125.2 kg    Examination: General: Well appearing adult female sitting up in bed in NAD HEENT: [place]/ATT, MM pink/moist, PERRL,  Neuro: Alert and oriented x3 CV: s1s2 regular rate and rhythm, no murmur, rubs, or gallops,  PULM:  Clear to ascultation, no increased work of breathing, no wheezing, on RA GI: soft, bowel sounds active in all 4 quadrants, non-tender, non-distended, tolerating oral diet Extremities: warm/dry, no edema  Skin: no rashes or lesions  Resolved problems   Hypokalemia   Assessment & Plan:   Acute asthma exacerbation requiring BiPAP  -Appears to be mainly due to anxiety and work of breathing rather then need for O2 support. While in ED, she received Albuterol, DuoNeb, 2mg  Ativan, 2g Mag, 125mg  Solumerdol, 4mg  Morphine. RVP and PCT negative. Moderate persistent asthma,  allergic asthma P: Acute Exacerbation of Chronic COPD  -DDX include Asthma, AECHF, Bronchiectasis, TB, or Bronchiolitis  P: No on RA Continue Singulair and Zyrtec Continue steroids, wean as able  Continue PO Doxy  BIPAP as needed  Smoking cessation education Appropriate vaccinations prior to discharge  While inpatient continue scheduled Brovana, Yupelri, and Pulmciort  Transition back to Symbicort as an  outpatient  PRN Pulmicort  Will need outpatient follow up with Allergy and Pulmonary   GERD P: Continue H2 blocker BID  Encouraged weight loss   Chronic anemia -Hgb remains stable in the 11 range  P: Intermittently trend CBC   Hx Depression, Anxiety, Migraines. P: Continue home Amitriptyline, Duloxetine, Sumatriptan, Tizanidine, and Totipalmate   Chest and back pain -Likely due to significant coughing recently P: PRN Acetaminophen and PO Vicodin Cough suppressant   Best practice (evaluated daily):  Diet/type: Regular consistency (see orders) DVT prophylaxis: prophylactic heparin  GI prophylaxis: H2B Lines: N/A Foley:  N/A Code Status:  full code Last date of multidisciplinary goals of care discussion: Updated patient and family at bedside    Carlosdaniel Grob D. Tiburcio Pea, NP-C [facility] Personal contact information can be found on Amion  11/26/2021, 7:11 AM

## 2021-11-26 NOTE — Telephone Encounter (Signed)
Patient scheduled on 12/24/2021 at 11:30am with Dr. Celine Mans- reminder mailed to address on file- nothing further needed.

## 2021-11-27 ENCOUNTER — Other Ambulatory Visit (HOSPITAL_COMMUNITY): Payer: Self-pay

## 2021-11-27 DIAGNOSIS — K219 Gastro-esophageal reflux disease without esophagitis: Secondary | ICD-10-CM

## 2021-11-27 DIAGNOSIS — J4541 Moderate persistent asthma with (acute) exacerbation: Secondary | ICD-10-CM | POA: Diagnosis not present

## 2021-11-27 DIAGNOSIS — J4540 Moderate persistent asthma, uncomplicated: Secondary | ICD-10-CM

## 2021-11-27 DIAGNOSIS — D649 Anemia, unspecified: Secondary | ICD-10-CM

## 2021-11-27 MED ORDER — DOXYCYCLINE HYCLATE 100 MG PO TABS
100.0000 mg | ORAL_TABLET | Freq: Two times a day (BID) | ORAL | Status: DC
  Administered 2021-11-27: 100 mg via ORAL
  Filled 2021-11-27: qty 1

## 2021-11-27 MED ORDER — FAMOTIDINE 20 MG PO TABS
20.0000 mg | ORAL_TABLET | Freq: Two times a day (BID) | ORAL | Status: DC
  Administered 2021-11-27: 20 mg via ORAL
  Filled 2021-11-27: qty 1

## 2021-11-27 MED ORDER — PREDNISONE 10 MG PO TABS
ORAL_TABLET | ORAL | 0 refills | Status: AC
  Filled 2021-11-27: qty 30, 10d supply, fill #0

## 2021-11-27 MED ORDER — DOXYCYCLINE HYCLATE 100 MG PO TABS
100.0000 mg | ORAL_TABLET | Freq: Two times a day (BID) | ORAL | 0 refills | Status: DC
  Filled 2021-11-27: qty 10, 5d supply, fill #0

## 2021-11-27 MED ORDER — PREDNISONE 20 MG PO TABS
40.0000 mg | ORAL_TABLET | Freq: Every day | ORAL | Status: DC
  Administered 2021-11-27: 40 mg via ORAL
  Filled 2021-11-27: qty 2

## 2021-11-27 NOTE — Discharge Summary (Signed)
Physician Discharge Summary      Patient ID: Laurie Reed MRN: [id number] DOB/AGE: 06/21/1985 36 y.o.  Admit date: 11/24/2021 Discharge date: 11/27/2021  Discharge Diagnoses:   Acute asthma exacerbation requiring BiPAP  Moderate persistent asthma, allergic asthma GERD Chronic anemia Hx Depression, Anxiety, Migraines. Chest and back pain  Discharge summary   Laurie Reed is a 36 y.o. female who has a PMH as below including but not limited to Asthma, anxiety, pre-diabetes. She presented to [facility] 7/11 with cough, dyspnea, chest tightness. Symptoms initially began March 2023 and persisted. She apparently saw her PCP 2 weeks prior to ED presentation and was told that she was having an asthma "flare up". She was prescribed a Predisone taper and Albuterol inhaler.   In ED, CTA chest was negative for PE. She was treated for asthma exacerbation but then noted to be in respiratory distress; therefore, started on BiPAP.   Due to her distress despite BiPAP, PCCM called for transfer to [facility] ICU.   She has been taking her Symbicort twice daily, but not at scheduled intervals and it does not sound like it has been close to 12 hours apart between doses.  She at baseline infrequently uses albuterol, but has been recently been using about twice per day, in the morning and in the evening.  She has had coughing with posttussive vomiting recently.  Her 2 most recent exacerbations were related to respiratory infections, her most recent being related to COVID about 2 years ago.  She takes Zyrtec at baseline.  She has a history of requiring allergy shots, which she has not been receiving recently.  Her PCP mentioned referring her to allergy immunology, but the referral had not yet been placed.  During admission patient was treated with BiPAP therapy, IV steroids, and bronchodilators with improvement in work of breathing.  She has now been off BiPAP therapy and on room air x24 hours.  She is also been able  to ambulate and eat per baseline with no acute complications.  Primary pulmonologist referral has been placed and patient has upcoming appointment August 10.  We will also reach out to the local allergist to place referral.  She is encouraged to continue pulmonary hygiene, bronchodilators, and keep upcoming appointments upon discharge.  Family made aware of discharge plan.  Discharge Plan by Active Problems   Acute asthma exacerbation requiring BiPAP  -Appears to be mainly due to anxiety and work of breathing rather then need for O2 support. While in ED, she received Albuterol, DuoNeb, 2mg  Ativan, 2g Mag, 125mg  Solumerdol, 4mg  Morphine. RVP and PCT negative. Moderate persistent asthma, allergic asthma P: Acute Exacerbation of Chronic COPD  -DDX include Asthma, AECHF, Bronchiectasis, TB, or Bronchiolitis  P: Continue Symbicort, Singulair, and Zyrtec upon discharge Encourage continued pulmonary hygiene upon discharge Continue Doxy for a total of 7 days of coverage  Prednisone taper upon discharge  Patient was encourage to have PCP send referral to allergist  Appointment made with Dr. to establish as a new patient, appointment is 8/10 at 11:30    GERD P: Continue H2 blocker twice daily upon discharge   Chronic anemia -Hgb remains stable in the 11 range  P: Intermittently trend CBC per PCP   Hx Depression, Anxiety, Migraines. P: Continue home amitriptyline, duloxetine, sumatriptan, trazodone, and totipalmate upon discharge    Chest and back pain -Likely due to significant coughing recently P: Encourage use of over the counter Tylenol and ibuprofen for pain control   Significant Hospital tests/events  [facility] to [facility] 7/13 Off BIPAP and supplemental oxygen this am, continues to report chest and back pain 7/14 back to baseline and ready for D/C Procedures   None   Culture data/antimicrobials   RVP panel > negative    Consults  None      Discharge Exam: BP 126/79   Pulse 70   Temp 97.6 F (36.4 C) (Oral)   Resp 14   Ht 5\' 2"  (1.575 m)   Wt 125.2 kg   LMP 11/19/2021   SpO2 98%   BMI 50.48 kg/m   General: Well appearing female lying in bed in NAD HEENT: [place]/ATT, MM pink/moist, PERRL,  Neuro: Alert and oriented x3, non-focal  CV: s1s2 regular rate and rhythm, no murmur, rubs, or gallops,  PULM:  Clear to ascultation, no increased work of breathing, no added breath sounds  GI: soft, bowel sounds active in all 4 quadrants, non-tender, non-distended, tolerating oral diet Extremities: warm/dry, no edema  Skin: no rashes or lesions  Labs at discharge   Lab Results  Component Value Date   CREATININE 0.78 11/26/2021   BUN 19 11/26/2021   NA 139 11/26/2021   K 4.7 11/26/2021   CL 111 11/26/2021   CO2 22 11/26/2021   Lab Results  Component Value Date   WBC 13.7 (H) 11/26/2021   HGB 11.0 (L) 11/26/2021   HCT 35.6 (L) 11/26/2021   MCV 82.2 11/26/2021   PLT 357 11/26/2021   Lab Results  Component Value Date   ALT 13 03/06/2020   AST 12 (L) 03/06/2020   ALKPHOS 55 03/06/2020   BILITOT 0.4 03/06/2020   No results found for: "INR", "PROTIME"  Current radiological studies    No results found.  Disposition:        Allergies as of 11/27/2021       Reactions   Latex Hives, Itching   Other Itching   "20 different types of trees"   Zofran [ondansetron] Itching   Tree Extract Itching        Medication List     STOP taking these medications    fluconazole 150 MG tablet Commonly known as: Diflucan   methylPREDNISolone 4 MG Tbpk tablet Commonly known as: Medrol   metroNIDAZOLE 500 MG tablet Commonly known as: FLAGYL   promethazine-dextromethorphan 6.25-15 MG/5ML syrup Commonly known as: PROMETHAZINE-DM       TAKE these medications    acetaminophen 500 MG tablet Commonly known as: TYLENOL Take 500-1,000 mg by mouth every 6 (six) hours as needed for mild pain, moderate pain or  headache (or migraines).   amitriptyline 25 MG tablet Commonly known as: ELAVIL Take 1 tablet (25 mg total) by mouth at bedtime as needed. What changed: reasons to take this   calcium carbonate 500 MG chewable tablet Commonly known as: TUMS - dosed in mg elemental calcium Chew 1-3 tablets by mouth daily as needed for indigestion or heartburn.   cetirizine 10 MG tablet Commonly known as: ZYRTEC Take 1 tablet (10 mg total) by mouth daily. Prn itching   diphenhydrAMINE 25 mg capsule Commonly known as: BENADRYL Take 25-50 mg by mouth 2 (two) times daily as needed for allergies.   doxycycline 100 MG tablet Commonly known as: VIBRA-TABS Take 1 tablet (100 mg total) by mouth every 12 (twelve) hours.   ferrous sulfate 324 (65 Fe) MG Tbec 1 pill daily after meal to help with anemia   fluticasone 50 MCG/ACT nasal spray Commonly known as: FLONASE  Place 1 spray into both nostrils in the morning and at bedtime.   hydrocortisone 2.5 % cream Apply 2 times daily as needed for bumps   montelukast 10 MG tablet Commonly known as: Singulair Take 1 tablet (10 mg total) by mouth at bedtime.   omeprazole 40 MG capsule Commonly known as: PRILOSEC TAKE 1 CAPSULE (40 MG TOTAL) BY MOUTH DAILY. TO REDUCE STOMACH ACID   oxyCODONE-acetaminophen 5-325 MG tablet Commonly known as: PERCOCET/ROXICET Take 1 tablet by mouth 2 (two) times daily as needed. What changed: reasons to take this   phentermine 37.5 MG tablet Commonly known as: ADIPEX-P Take 1 tablet (37.5 mg total) by mouth in the morning.   predniSONE 10 MG tablet Commonly known as: DELTASONE Take 4 tablets (40 mg total) by mouth daily for 5 days, THEN 3 tablets (30 mg total) daily for 2 days, THEN 2 tablets (20 mg total) daily for 1 day, THEN 1 tablet (10 mg total) daily for 1 day, THEN 0.5 tablets (5 mg total) daily for 1 day. Start taking on: November 27, 2021   SUMAtriptan 100 MG tablet Commonly known as: Imitrex Take 1 tablet by mouth  at onset of migraine symptoms, may repeat in 1-2 hours if persistent.   Symbicort 160-4.5 MCG/ACT inhaler Generic drug: budesonide-formoterol Inhale 2 puffs into the lungs 2 (two) times daily.   tiZANidine 4 MG tablet Commonly known as: ZANAFLEX Take 1 tablet by mouth twice daily as needed What changed:  how much to take how to take this when to take this reasons to take this   topiramate 100 MG tablet Commonly known as: Topamax Take 1 tablet by mouth once a day   triamcinolone cream 0.1 % Commonly known as: KENALOG Apply 1 application topically 2 (two) times daily as needed. What changed: reasons to take this   Ventolin HFA 108 (90 Base) MCG/ACT inhaler Generic drug: albuterol Inhale 2 puffs into the lungs 4 (four) times daily as needed. What changed: Another medication with the same name was changed. Make sure you understand how and when to take each.   albuterol (2.5 MG/3ML) 0.083% nebulizer solution Commonly known as: PROVENTIL Use 1 vial via nebulizer every 6 hours and as needed What changed:  how to take this additional instructions   Vitamin D (Ergocalciferol) 1.25 MG (50000 UNIT) Caps capsule Commonly known as: DRISDOL Take 1 capsule (50,000 Units total) by mouth once a week.         Follow-up appointment   Pulmonary  PCP Allergist   Discharge Condition:    good  Signed: Wylie Russon D. Tiburcio Pea, NP-C [place] [facility] Personal contact information can be found on Amion  11/27/2021, 10:22 AM

## 2021-11-27 NOTE — Hospital Course (Signed)
Laurie Reed is a 36 y.o. female who has a PMH as below including but not limited to Asthma, anxiety, pre-diabetes. She presented to [facility] 7/11 with cough, dyspnea, chest tightness. Symptoms initially began March 2023 and persisted. She apparently saw her PCP 2 weeks prior to ED presentation and was told that she was having an asthma "flare up". She was prescribed a Predisone taper and Albuterol inhaler.   In ED, CTA chest was negative for PE. She was treated for asthma exacerbation but then noted to be in respiratory distress; therefore, started on BiPAP.

## 2021-11-27 NOTE — Discharge Instructions (Signed)
Please attend new patent appointment with Dr. Durel Salts Pulmonologist (lung specialist) 12/24/2021 at 1130  Please be sure to take all prescribed antibiotics as ordered    Due to insurance requirements you will need to have your PCP (primary doctor) send the referral to the allergist. Below are the names, addresses and contact information to two local Allergy specialist.   [facility]  [address] [place] [address] [phone]  [facility] [address] [place] [address] [phone]

## 2021-11-27 NOTE — Progress Notes (Signed)
   11/27/21 1145  AVS Discharge Documentation  AVS Discharge Instructions Including Medications Provided to patient/caregiver  Name of Person Receiving AVS Discharge Instructions Including Medications Laurie Reed  Name of Clinician That Reviewed AVS Discharge Instructions Including Medications Donnita Falls, RN

## 2021-12-03 ENCOUNTER — Other Ambulatory Visit (HOSPITAL_COMMUNITY): Payer: Self-pay

## 2021-12-03 MED ORDER — OXYCODONE-ACETAMINOPHEN 5-325 MG PO TABS
1.0000 | ORAL_TABLET | Freq: Two times a day (BID) | ORAL | 0 refills | Status: DC
  Filled 2021-12-03: qty 14, 7d supply, fill #0

## 2021-12-09 ENCOUNTER — Telehealth: Payer: Self-pay | Admitting: Internal Medicine

## 2021-12-09 ENCOUNTER — Other Ambulatory Visit (HOSPITAL_COMMUNITY): Payer: Self-pay

## 2021-12-09 MED ORDER — FLUTICASONE PROPIONATE 50 MCG/ACT NA SUSP
2.0000 | Freq: Every day | NASAL | 5 refills | Status: DC
  Filled 2021-12-09: qty 16, 30d supply, fill #0
  Filled 2022-06-11: qty 16, 30d supply, fill #1

## 2021-12-09 MED ORDER — AMITRIPTYLINE HCL 25 MG PO TABS
25.0000 mg | ORAL_TABLET | Freq: Every evening | ORAL | 5 refills | Status: DC | PRN
  Filled 2021-12-09: qty 30, 30d supply, fill #0

## 2021-12-09 MED ORDER — CETIRIZINE HCL 10 MG PO TABS
10.0000 mg | ORAL_TABLET | Freq: Every evening | ORAL | 5 refills | Status: DC
  Filled 2021-12-09: qty 30, 30d supply, fill #0
  Filled 2022-06-11: qty 30, 30d supply, fill #1

## 2021-12-09 MED ORDER — ALBUTEROL SULFATE HFA 108 (90 BASE) MCG/ACT IN AERS
2.0000 | INHALATION_SPRAY | Freq: Four times a day (QID) | RESPIRATORY_TRACT | 5 refills | Status: DC | PRN
  Filled 2021-12-09: qty 18, 25d supply, fill #0
  Filled 2022-09-17: qty 18, 25d supply, fill #1

## 2021-12-09 MED ORDER — ALBUTEROL SULFATE (2.5 MG/3ML) 0.083% IN NEBU
3.0000 mL | INHALATION_SOLUTION | Freq: Four times a day (QID) | RESPIRATORY_TRACT | 11 refills | Status: DC | PRN
  Filled 2021-12-09: qty 150, 13d supply, fill #0

## 2021-12-09 MED ORDER — MONTELUKAST SODIUM 10 MG PO TABS
10.0000 mg | ORAL_TABLET | Freq: Every day | ORAL | 5 refills | Status: DC
  Filled 2021-12-09: qty 30, 30d supply, fill #0
  Filled 2022-06-11: qty 30, 30d supply, fill #1

## 2021-12-09 MED ORDER — SUMATRIPTAN SUCCINATE 100 MG PO TABS
100.0000 mg | ORAL_TABLET | ORAL | 5 refills | Status: DC | PRN
  Filled 2021-12-09: qty 9, 30d supply, fill #0

## 2021-12-09 MED ORDER — BUDESONIDE-FORMOTEROL FUMARATE 160-4.5 MCG/ACT IN AERO
2.0000 | INHALATION_SPRAY | Freq: Two times a day (BID) | RESPIRATORY_TRACT | 5 refills | Status: DC
  Filled 2021-12-09: qty 10.2, 30d supply, fill #0

## 2021-12-09 MED ORDER — TOPIRAMATE 100 MG PO TABS
100.0000 mg | ORAL_TABLET | Freq: Every day | ORAL | 5 refills | Status: DC
  Filled 2021-12-09: qty 30, 30d supply, fill #0
  Filled 2022-06-11: qty 30, 30d supply, fill #1

## 2021-12-09 NOTE — Telephone Encounter (Signed)
pt came into office to drop off ''unum'' paperwork from when she was admitted into the hospital on 7/11 and seen by Janeann Forehand NP,  while she was [facility]. pt states she went back to the hospital to have the ''unum'' paperwork filled out but she was told to come here at [facility], so the paperwork can be filled out. Pt has a hospital follow up with md mesai on 8/10 as well. Pt is also aware that paperwork takes 7-10 days for completion. I will put this is darilyns box from here.

## 2021-12-11 ENCOUNTER — Other Ambulatory Visit (HOSPITAL_COMMUNITY): Payer: Self-pay

## 2021-12-11 MED ORDER — OXYCODONE-ACETAMINOPHEN 5-325 MG PO TABS
1.0000 | ORAL_TABLET | Freq: Two times a day (BID) | ORAL | 0 refills | Status: DC | PRN
  Filled 2021-12-11: qty 14, 7d supply, fill #0

## 2021-12-16 ENCOUNTER — Telehealth: Payer: Self-pay | Admitting: Internal Medicine

## 2021-12-16 NOTE — Telephone Encounter (Signed)
[employer] hospital claim form was prepared and sent back to Dr. Celine Mans for completion and signature.

## 2021-12-18 ENCOUNTER — Other Ambulatory Visit (HOSPITAL_COMMUNITY): Payer: Self-pay

## 2021-12-18 MED ORDER — OXYCODONE-ACETAMINOPHEN 5-325 MG PO TABS
1.0000 | ORAL_TABLET | Freq: Two times a day (BID) | ORAL | 0 refills | Status: DC | PRN
  Filled 2021-12-18: qty 14, 7d supply, fill #0

## 2021-12-21 DIAGNOSIS — Z0289 Encounter for other administrative examinations: Secondary | ICD-10-CM

## 2021-12-21 NOTE — Telephone Encounter (Signed)
Hospital claim form faxed to [facility] at fax# [phone].  Spoke to patient and let her know and mailed hard copy. Patient is aware of $29 fee.

## 2021-12-22 ENCOUNTER — Other Ambulatory Visit (HOSPITAL_COMMUNITY): Payer: Self-pay

## 2021-12-24 ENCOUNTER — Encounter: Payer: Self-pay | Admitting: Internal Medicine

## 2021-12-24 ENCOUNTER — Other Ambulatory Visit (HOSPITAL_COMMUNITY): Payer: Self-pay

## 2021-12-24 ENCOUNTER — Ambulatory Visit (INDEPENDENT_AMBULATORY_CARE_PROVIDER_SITE_OTHER): Payer: BLUE CROSS/BLUE SHIELD | Admitting: Internal Medicine

## 2021-12-24 VITALS — BP 110/64 | HR 83 | Temp 97.7°F | Ht 62.0 in | Wt 277.0 lb

## 2021-12-24 DIAGNOSIS — K219 Gastro-esophageal reflux disease without esophagitis: Secondary | ICD-10-CM | POA: Diagnosis not present

## 2021-12-24 DIAGNOSIS — R0602 Shortness of breath: Secondary | ICD-10-CM

## 2021-12-24 DIAGNOSIS — R072 Precordial pain: Secondary | ICD-10-CM

## 2021-12-24 DIAGNOSIS — J4550 Severe persistent asthma, uncomplicated: Secondary | ICD-10-CM

## 2021-12-24 DIAGNOSIS — R1013 Epigastric pain: Secondary | ICD-10-CM

## 2021-12-24 DIAGNOSIS — D7219 Other eosinophilia: Secondary | ICD-10-CM

## 2021-12-24 DIAGNOSIS — R1084 Generalized abdominal pain: Secondary | ICD-10-CM

## 2021-12-24 MED ORDER — FLUTICASONE-SALMETEROL 500-50 MCG/ACT IN AEPB
1.0000 | INHALATION_SPRAY | Freq: Two times a day (BID) | RESPIRATORY_TRACT | 11 refills | Status: DC
  Filled 2021-12-24: qty 60, 30d supply, fill #0
  Filled 2022-06-11 – 2022-09-17 (×2): qty 60, 30d supply, fill #1

## 2021-12-24 MED ORDER — OMEPRAZOLE 40 MG PO CPDR
40.0000 mg | DELAYED_RELEASE_CAPSULE | Freq: Every day | ORAL | 11 refills | Status: AC
  Filled 2021-12-24: qty 30, 30d supply, fill #0
  Filled 2022-06-11: qty 30, 30d supply, fill #1

## 2021-12-24 NOTE — Progress Notes (Signed)
Laurie Reed    [id number]    1985/07/19  Primary Care Physician:Avbuere, Dorma Russell, MD Date of Appointment: 12/24/2021 Established Patient Visit  Chief complaint:   Chief Complaint  Patient presents with   Follow-up    Hosp. F/u-sob with exertion, cough dry occass.     HPI: Laurie Reed is a 36 y.o.a woman with asthma since childhood requiring ICU recent stay. 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 Was discharged on maintenance therapies below. Respiratory viral panel was negative. Procalcitonin was low.   Interval Updates: Here for hospital follow up. Since discharge has been taking symbicort faithfully. She hasn't needed albuterol much since the initial discharge. She has been using her son's nebulizer machine and is wondering if she can have her own.   Has had asthma since childhood but feels it has gotten worse as an adult. Her PCP was supposed to change her symbicort to another medication but she got sick before she could follow up.     Current Regimen: symbicort 160 2 puffs twice a day. Albuterol prn.  Asthma Triggers: URIs, chemical smells, dust Exacerbations in the last year:  4-5 times History of hospitalization or intubation: one admission July 2023 (previously had ED visits.) Allergy Testing: never had GERD: yes on H2 blocker Allergic Rhinitis: zyrtec, singulair, flonase ACT:  Asthma Control Test ACT Total Score  12/24/2021 11:44 AM 13   FeNO: obtained 12/24/21 but she was unable to complete maneuver   Overall asthma has worsened as she got older. All 4 children have asthma.  Never smoker, no passive smoke exposure.   Lives at home with her 4 children.  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   I have reviewed the patient's family social and past medical history and updated as appropriate.   Past Medical History:  Diagnosis Date   Acid reflux 2004    Anemia    Anxiety 2008   Asthma 1993   no previous intubations or hospitalizations    Carpal tunnel syndrome    Chlamydia    Depression 2008   no meds, currenly ok   Environmental allergies    GERD (gastroesophageal reflux disease)    Migraines    Miscarriage    Ovarian cyst    Pre-diabetes    Ulcer of the stomach and intestine 2004   Urinary tract infection     Past Surgical History:  Procedure Laterality Date   CARPAL TUNNEL RELEASE Left    COLONOSCOPY  2004   IRRIGATION AND DEBRIDEMENT SEBACEOUS CYST N/A 06/01/2017   Procedure: EXCISION SEBACEOUS CYST ON SCALP;  Surgeon: Ancil Linsey, MD;  Location: [facility];  Service: General;  Laterality: N/A;   MULTIPLE TOOTH EXTRACTIONS  2015   6 teeth removed    TUBAL LIGATION N/A 04/04/2018   Procedure: POST PARTUM TUBAL LIGATION;  Surgeon: Levie Heritage, DO;  Location: [facility];  Service: Gynecology;  Laterality: N/A;   WISDOM TOOTH EXTRACTION  2007    Family History  Problem Relation Age of Onset   Asthma Mother    Heart disease Mother    Hypertension Mother    Clotting disorder Mother    Diabetes Mother    Asthma Maternal Aunt    Hypertension Maternal Aunt    Diabetes Maternal Aunt    Asthma Maternal Uncle    Hypertension Maternal Uncle    Diabetes Maternal Uncle  Heart disease Maternal Grandmother    Diabetes Maternal Grandmother    Hypertension Maternal Grandmother    Heart disease Maternal Grandfather    Diabetes Maternal Grandfather    Hypertension Maternal Grandfather    Hypertension Paternal Grandmother    Diabetes Maternal Uncle    Heart disease Maternal Uncle    Diabetes Maternal Uncle    Cancer Paternal Aunt    Hypotension Neg Hx    Anesthesia problems Neg Hx    Malignant hyperthermia Neg Hx    Pseudochol deficiency Neg Hx     Social History   Occupational History    Employer: [employer]  Tobacco Use   Smoking status: Never   Smokeless tobacco: Never  Vaping Use    Vaping Use: Never used  Substance and Sexual Activity   Alcohol use: No    Alcohol/week: 0.0 standard drinks of alcohol   Drug use: No   Sexual activity: Not on file     Physical Exam: Blood pressure 110/64, pulse 83, temperature 97.7 F (36.5 C), temperature source Temporal, height 5\' 2"  (1.575 m), weight 277 lb (125.6 kg), last menstrual period 11/19/2021, SpO2 99 %.  Gen:      No acute distress, obese ENT:  no nasal polyps, mucus membranes moist, mallampati IV Lungs:    No increased respiratory effort, symmetric chest wall excursion, clear to auscultation bilaterally, no wheezes or crackles CV:         Regular rate and rhythm; no murmurs, rubs, or gallops.  No pedal edema   Data Reviewed: Imaging: I have personally reviewed the CTPE study July 2023 - no acute cardiopulmonary process  PFTs:      No data to display         I have personally reviewed the patient's PFTs and spirometry shows no airflow limitation, suggests restriction.   Labs: Labs reviewed - absolute eosinophil coung 600 in April 2020, was 300 in July 2023 while on steroids.   Immunization status: Immunization History  Administered Date(s) Administered   Influenza,inj,Quad PF,6+ Mos 03/18/2019   PPD Test 12/31/2015, 01/12/2016   Pneumococcal Polysaccharide-23 04/12/2013   Tdap 04/11/2013, 01/06/2018    External Records Personally Reviewed: hospital stay  Assessment:  Severe persistent asthma, with recent hospitalization Peripheral eosinophilia Chronic Rhinitis GERD  Plan/Recommendations: Not well controlled asthma.  01/08/2018 and feno today show no airflow limitation, suggests restriction. Unable to complete feno.   Check region 2 allergy panel with [facility]. May need biologic therapy.   I think we can get your asthma feeling much better. To start we need to get you on a higher dose steroid inhaler. Will switch you to advair 500 1 puff twice a day, gargle after use. Stop symbicort.  I will send a  nebulizer machine to your home for breathing treatments.  Continue singulair, zyrtec, flonase for allergies.   Continue your acid reflux medicine daily.   I spent 30 minutes on 12/24/2021 in care of this patient including face to face time and non-face to face time spent charting, review of outside records, and coordination of care.   Return to Care: Return in about 2 months (around 02/23/2022).   04/25/2022, MD Pulmonary and Critical Care Medicine [facility] Office:[phone]

## 2021-12-24 NOTE — Progress Notes (Signed)
The patient has been prescribed the inhaler advair diskus. Inhaler technique was demonstrated to patient. The patient subsequently demonstrated correct technique.

## 2021-12-24 NOTE — Addendum Note (Signed)
Addended by: Charlott Holler on: 12/24/2021 12:30 PM   Modules accepted: Orders

## 2021-12-24 NOTE — Patient Instructions (Addendum)
Please schedule follow up scheduled with myself in 2 months.  If my schedule is not open yet, we will contact you with a reminder closer to that time. Please call [phone] if you haven't heard from [place] a month before.   Before your next visit I would like you to have: Blood work on your way out today. This will test for allergies so we can help figure out what to help you avoid and can qualify you for other asthma therapies down the line.   I think we can get your asthma feeling much better. To start we need to get you on a higher dose steroid inhaler. Will switch you to advair 500 1 puff twice a day, gargle after use. Stop symbicort. I will send a nebulizer machine to your home for breathing treatments. Continue singulair, zyrtec, flonase for allergies.  Continue your acid reflux medicine daily.   Sometimes the inhalers we prescribe are either not covered or are too expensive.  In most cases your doctor can substitute an alternate inhaler which is covered under your insurance formulary.  Please call your insurance company to find out which inhaler is covered and let us know, and we can prescribe an alternative.   Flonase - 1 spray on each side of your nose twice a day for first week, then 1 spray on each side.   Instructions for use: If you also use a saline nasal spray or rinse, use that first. Position the head with the chin slightly tucked. Use the right hand to spray into the left nostril and the right hand to spray into the left nostril.   Point the bottle away from the septum of your nose (cartilage that divides the two sides of your nose).  Hold the nostril closed on the opposite side from where you will spray Spray once and gently sniff to pull the medicine into the higher parts of your nose.  Don't sniff too hard as the medicine will drain down the back of your throat instead. Repeat with a second spray on the same side if prescribed. Repeat on the other side of your nose.   By  learning about asthma and how it can be controlled, you take an important step toward managing this disease. Work closely with your asthma care team to learn all you can about your asthma, how to avoid triggers, what your medications do, and how to take them correctly. With proper care, you can live free of asthma symptoms and maintain a normal, healthy lifestyle.   What is asthma? Asthma is a chronic disease that affects the airways of the lungs. During normal breathing, the bands of muscle that surround the airways are relaxed and air moves freely. During an asthma episode or "attack," there are three main changes that stop air from moving easily through the airways: The bands of muscle that surround the airways tighten and make the airways narrow. This tightening is called bronchospasm.  The lining of the airways becomes swollen or inflamed.  The cells that line the airways produce more mucus, which is thicker than normal and clogs the airways.  These three factors - bronchospasm, inflammation, and mucus production - cause symptoms such as difficulty breathing, wheezing, and coughing.  What are the most common symptoms of asthma? Asthma symptoms are not the same for everyone. They can even change from episode to episode in the same person. Also, you may have only one symptom of asthma, such as cough, but another person  may have all the symptoms of asthma. It is important to know all the symptoms of asthma and to be aware that your asthma can present in any of these ways at any time. The most common symptoms include: Coughing, especially at night  Shortness of breath  Wheezing  Chest tightness, pain, or pressure   Who is affected by asthma? Asthma affects 22 million Americans; about 6 million of these are children under age 52. People who have a family history of asthma have an increased risk of developing the disease. Asthma is also more common in people who have allergies or who are exposed to  tobacco smoke. However, anyone can develop asthma at any time. Some people may have asthma all of their lives, while others may develop it as adults.  What causes asthma? The airways in a person with asthma are very sensitive and react to many things, or "triggers." Contact with these triggers causes asthma symptoms. One of the most important parts of asthma control is to identify your triggers and then avoid them when possible. The only trigger you do not want to avoid is exercise. Pre-treatment with medicines before exercise can allow you to stay active yet avoid asthma symptoms. Common asthma triggers include: Infections (colds, viruses, flu, sinus infections)  Exercise  Weather (changes in temperature and/or humidity, cold air)  Tobacco smoke  Allergens (dust mites, pollens, pets, mold spores, cockroaches, and sometimes foods)  Irritants (strong odors from cleaning products, perfume, wood smoke, air pollution)  Strong emotions such as crying or laughing hard  Some medications   How is asthma diagnosed? To diagnose asthma, your doctor will first review your medical history, family history, and symptoms. Your doctor will want to know any past history of breathing problems you may have had, as well as a family history of asthma, allergies, eczema (a bumpy, itchy skin rash caused by allergies), or other lung disease. It is important that you describe your symptoms in detail (cough, wheeze, shortness of breath, chest tightness), including when and how often they occur. The doctor will perform a physical examination and listen to your heart and lungs. He or she may also order breathing tests, allergy tests, blood tests, and chest and sinus X-rays. The tests will find out if you do have asthma and if there are any other conditions that are contributing factors.  How is asthma treated? Asthma can be controlled, but not cured. It is not normal to have frequent symptoms, trouble sleeping, or trouble  completing tasks. Appropriate asthma care will prevent symptoms and visits to the emergency room and hospital. Asthma medicines are one of the mainstays of asthma treatment. The drugs used to treat asthma are explained below.  Anti-inflammatories: These are the most important drugs for most people with asthma. Anti-inflammatory drugs reduce swelling and mucus production in the airways. As a result, airways are less sensitive and less likely to react to triggers. These medications need to be taken daily and may need to be taken for several weeks before they begin to control asthma. Anti-inflammatory medicines lead to fewer symptoms, better airflow, less sensitive airways, less airway damage, and fewer asthma attacks. If taken every day, they CONTROL or prevent asthma symptoms.   Bronchodilators: These drugs relax the muscle bands that tighten around the airways. This action opens the airways, letting more air in and out of the lungs and improving breathing. Bronchodilators also help clear mucus from the lungs. As the airways open, the mucus moves more freely and  can be coughed out more easily. In short-acting forms, bronchodilators RELIEVE or stop asthma symptoms by quickly opening the airways and are very helpful during an asthma episode. In long-acting forms, bronchodilators provide CONTROL of asthma symptoms and prevent asthma episodes.  Asthma drugs can be taken in a variety of ways. Inhaling the medications by using a metered dose inhaler, dry powder inhaler, or nebulizer is one way of taking asthma medicines. Oral medicines (pills or liquids you swallow) may also be prescribed.  Asthma severity Asthma is classified as either "intermittent" (comes and goes) or "persistent" (lasting). Persistent asthma is further described as being mild, moderate, or severe. The severity of asthma is based on how often you have symptoms both during the day and night, as well as by the results of lung function tests and by  how well you can perform activities. The "severity" of asthma refers to how "intense" or "strong" your asthma is.  Asthma control Asthma control is the goal of asthma treatment. Regardless of your asthma severity, it may or may not be controlled. Asthma control means: You are able to do everything you want to do at work and home  You have no (or minimal) asthma symptoms  You do not wake up from your sleep or earlier than usual in the morning due to asthma  You rarely need to use your reliever medicine (inhaler)  Another major part of your treatment is that you are happy with your asthma care and believe your asthma is controlled.  Monitoring symptoms A key part of treatment is keeping track of how well your lungs are working. Monitoring your symptoms, what they are, how and when they happen, and how severe they are, is an important part of being able to control your asthma.  Sometimes asthma is monitored using a peak flow meter. A peak flow (PF) meter measures how fast the air comes out of your lungs. It can help you know when your asthma is getting worse, sometimes even before you have symptoms. By taking daily peak flow readings, you can learn when to adjust medications to keep asthma under good control. It is also used to create your asthma action plan (see below). Your doctor can use your peak flow readings to adjust your treatment plan in some cases.  Asthma Action Plan Based on your history and asthma severity, you and your doctor will develop a care plan called an "asthma action plan." The asthma action plan describes when and how to use your medicines, actions to take when asthma worsens, and when to seek emergency care. Make sure you understand this plan. If you do not, ask your asthma care provider any questions you may have. Your asthma action plan is one of the keys to controlling asthma. Keep it readily available to remind you of what you need to do every day to control asthma and what  you need to do when symptoms occur.  Goals of asthma therapy These are the goals of asthma treatment: Live an active, normal life  Prevent chronic and troublesome symptoms  Attend work or school every day  Perform daily activities without difficulty  Stop urgent visits to the doctor, emergency department, or hospital  Use and adjust medications to control asthma with few or no side effects

## 2021-12-25 ENCOUNTER — Other Ambulatory Visit (HOSPITAL_COMMUNITY): Payer: Self-pay

## 2021-12-25 LAB — ALLERGY PANEL, REGION 2, GRASSES
CLASS: 4
CLASS: 4
CLASS: 4
Class: 3
Class: 3
G005 Rye, Perennial: 24.5 kU/L — ABNORMAL HIGH
G009 Red Top: 22.9 kU/L — ABNORMAL HIGH
Johnson Grass: 8.47 kU/L — ABNORMAL HIGH
ORCHARD GRASS (COCKSFOOT) (G3) IGE: 23.3 kU/L — ABNORMAL HIGH
Timothy Grass: 16.7 kU/L — ABNORMAL HIGH

## 2021-12-25 LAB — INTERPRETATION:

## 2021-12-25 MED ORDER — OXYCODONE-ACETAMINOPHEN 5-325 MG PO TABS
1.0000 | ORAL_TABLET | Freq: Two times a day (BID) | ORAL | 0 refills | Status: DC | PRN
  Filled 2021-12-25: qty 14, 7d supply, fill #0
  Filled 2021-12-28: qty 8, 4d supply, fill #0
  Filled 2021-12-28: qty 6, 3d supply, fill #0

## 2021-12-28 ENCOUNTER — Other Ambulatory Visit (HOSPITAL_COMMUNITY): Payer: Self-pay

## 2021-12-30 ENCOUNTER — Other Ambulatory Visit (HOSPITAL_COMMUNITY): Payer: Self-pay

## 2022-01-04 ENCOUNTER — Other Ambulatory Visit (HOSPITAL_COMMUNITY): Payer: Self-pay

## 2022-01-04 MED ORDER — OXYCODONE-ACETAMINOPHEN 5-325 MG PO TABS
1.0000 | ORAL_TABLET | Freq: Two times a day (BID) | ORAL | 0 refills | Status: DC | PRN
  Filled 2022-01-04: qty 14, 7d supply, fill #0

## 2022-01-06 ENCOUNTER — Telehealth: Payer: Self-pay | Admitting: Internal Medicine

## 2022-01-06 NOTE — Telephone Encounter (Signed)
Received short term disability form from [employer] on 8/9 - called patient to get additional information.  She has returned to work full time effective 12/29/21 (32 hrs/week).  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  She is getting short of breath and her asthma flairs up during these activities because of the exertion and strong chemical smells.  She asked that this information be included on the form.  In addition, she asked that Dr. Celine Mans write a letter to her employer explaining her limitations.   She stated that she has an inhaler, but she does not have an Albuterol inhaler and when she had a flair up on the job this past weekend, she believes Albuterol would have been helpful (the inhaler she has did not work well).

## 2022-01-07 ENCOUNTER — Other Ambulatory Visit: Payer: Self-pay

## 2022-01-08 ENCOUNTER — Other Ambulatory Visit: Payer: Self-pay

## 2022-01-08 NOTE — Telephone Encounter (Signed)
Dr. Celine Mans signed the disability forms, but did not complete the portions that detail work restrictions.  I have put them back on her desk for completion when she is in the office on 8/28.

## 2022-01-12 ENCOUNTER — Other Ambulatory Visit (HOSPITAL_COMMUNITY): Payer: Self-pay

## 2022-01-12 MED ORDER — OXYCODONE-ACETAMINOPHEN 5-325 MG PO TABS
1.0000 | ORAL_TABLET | Freq: Two times a day (BID) | ORAL | 0 refills | Status: DC | PRN
  Filled 2022-01-12: qty 14, 7d supply, fill #0

## 2022-01-12 NOTE — Telephone Encounter (Signed)
Dr. Celine Mans completed the work restrictions portion of the forms and I have faxed them to [employer] - fax# [phone].  Included office notes from 12/24/2021.

## 2022-01-15 ENCOUNTER — Telehealth: Payer: Self-pay | Admitting: Internal Medicine

## 2022-01-15 NOTE — Telephone Encounter (Signed)
-----   Message from Ephraim Hamburger sent at 01/08/2022 10:18 AM EDT ----- Regarding: RE: letter to employer and inhaler request Dr. Celine Mans - based on my conversation with her, I believe she wants the letter to say she may need to periodically leave work because of a flare up.  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   I am putting the two FMLA forms back on your desk so that you can complete the portions regarding work restrictions when you are in the office on Monday.  She returned to work 32 hr/week on 8/15.  ----- Message ----- From: Charlott Holler, MD Sent: 01/07/2022   9:23 AM EDT To: Darilyn R Knibb; Marylynn Pearson, RN; # Subject: RE: letter to employer and inhaler request     I received her request for short term disability. Darilyn did she give you dates for when she wants to go back to work?  Triage please send her an albuterol inhaler - she should have been given this when she left the hospital  [place] ----- Message ----- From: Ephraim Hamburger Sent: 01/06/2022   2:21 PM EDT To: Charlott Holler, MD; Marylynn Pearson, RN; # Subject: letter to employer and inhaler request         I spoke to this patient a few minutes ago about the short term disability form I'm working on for her.  She has requested an Albuterol inhaler - the inhaler she has did not work well when she had a flare up at work this past weekend.  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  I let her know someone would probably be calling her back about these requests.

## 2022-01-20 ENCOUNTER — Other Ambulatory Visit (HOSPITAL_COMMUNITY): Payer: Self-pay

## 2022-01-20 MED ORDER — OXYCODONE-ACETAMINOPHEN 5-325 MG PO TABS
1.0000 | ORAL_TABLET | Freq: Two times a day (BID) | ORAL | 0 refills | Status: DC | PRN
  Filled 2022-01-20: qty 14, 7d supply, fill #0

## 2022-01-25 ENCOUNTER — Other Ambulatory Visit (HOSPITAL_COMMUNITY): Payer: Self-pay

## 2022-01-25 MED ORDER — NOREL AD 4-10-325 MG PO TABS
1.0000 | ORAL_TABLET | Freq: Two times a day (BID) | ORAL | 2 refills | Status: DC | PRN
  Filled 2022-01-25: qty 20, 10d supply, fill #0

## 2022-01-25 MED ORDER — LAGEVRIO 200 MG PO CAPS
4.0000 | ORAL_CAPSULE | Freq: Two times a day (BID) | ORAL | 0 refills | Status: DC
  Filled 2022-01-25: qty 40, 5d supply, fill #0

## 2022-01-25 MED ORDER — PREDNISONE 20 MG PO TABS
ORAL_TABLET | ORAL | 0 refills | Status: AC
  Filled 2022-01-25: qty 10, 7d supply, fill #0

## 2022-01-29 ENCOUNTER — Other Ambulatory Visit (HOSPITAL_COMMUNITY): Payer: Self-pay

## 2022-01-29 MED ORDER — OXYCODONE-ACETAMINOPHEN 5-325 MG PO TABS
1.0000 | ORAL_TABLET | Freq: Two times a day (BID) | ORAL | 0 refills | Status: AC | PRN
  Filled 2022-01-29: qty 3, 2d supply, fill #0
  Filled 2022-01-29: qty 11, 5d supply, fill #0

## 2022-02-05 ENCOUNTER — Other Ambulatory Visit (HOSPITAL_COMMUNITY): Payer: Self-pay

## 2022-02-05 MED ORDER — OXYCODONE-ACETAMINOPHEN 5-325 MG PO TABS
1.0000 | ORAL_TABLET | Freq: Two times a day (BID) | ORAL | 0 refills | Status: DC
  Filled 2022-02-05: qty 14, 7d supply, fill #0

## 2022-02-09 ENCOUNTER — Other Ambulatory Visit (HOSPITAL_COMMUNITY): Payer: Self-pay

## 2022-02-10 ENCOUNTER — Other Ambulatory Visit (HOSPITAL_COMMUNITY): Payer: Self-pay

## 2022-02-10 MED ORDER — IBUPROFEN 800 MG PO TABS
800.0000 mg | ORAL_TABLET | Freq: Four times a day (QID) | ORAL | 0 refills | Status: DC | PRN
  Filled 2022-02-10: qty 21, 6d supply, fill #0

## 2022-02-10 MED ORDER — AMOXICILLIN 500 MG PO CAPS
500.0000 mg | ORAL_CAPSULE | Freq: Three times a day (TID) | ORAL | 0 refills | Status: DC
  Filled 2022-02-10: qty 21, 7d supply, fill #0

## 2022-02-12 ENCOUNTER — Other Ambulatory Visit (HOSPITAL_COMMUNITY): Payer: Self-pay

## 2022-02-12 MED ORDER — OXYCODONE-ACETAMINOPHEN 5-325 MG PO TABS
1.0000 | ORAL_TABLET | Freq: Two times a day (BID) | ORAL | 0 refills | Status: DC | PRN
  Filled 2022-02-12: qty 14, 7d supply, fill #0

## 2022-02-19 ENCOUNTER — Other Ambulatory Visit (HOSPITAL_COMMUNITY): Payer: Self-pay

## 2022-02-19 MED ORDER — OXYCODONE-ACETAMINOPHEN 5-325 MG PO TABS
1.0000 | ORAL_TABLET | Freq: Two times a day (BID) | ORAL | 0 refills | Status: DC | PRN
  Filled 2022-02-19: qty 14, 7d supply, fill #0

## 2022-02-22 ENCOUNTER — Ambulatory Visit
Admission: RE | Admit: 2022-02-22 | Discharge: 2022-02-22 | Disposition: A | Discharge status: Home / Self Care | Payer: BLUE CROSS/BLUE SHIELD | Source: Ambulatory Visit | Attending: Urgent Care | Admitting: Urgent Care

## 2022-02-22 ENCOUNTER — Other Ambulatory Visit (HOSPITAL_COMMUNITY): Payer: Self-pay

## 2022-02-22 VITALS — BP 136/73 | HR 100 | Temp 98.6°F | Resp 16

## 2022-02-22 DIAGNOSIS — M546 Pain in thoracic spine: Secondary | ICD-10-CM

## 2022-02-22 DIAGNOSIS — S39012A Strain of muscle, fascia and tendon of lower back, initial encounter: Secondary | ICD-10-CM | POA: Diagnosis not present

## 2022-02-22 DIAGNOSIS — M545 Low back pain, unspecified: Secondary | ICD-10-CM

## 2022-02-22 MED ORDER — TIZANIDINE HCL 4 MG PO TABS
4.0000 mg | ORAL_TABLET | Freq: Every day | ORAL | 0 refills | Status: DC
  Filled 2022-02-22: qty 30, 30d supply, fill #0

## 2022-02-22 MED ORDER — PREDNISONE 50 MG PO TABS
50.0000 mg | ORAL_TABLET | Freq: Every day | ORAL | 0 refills | Status: DC
  Filled 2022-02-22: qty 5, 5d supply, fill #0

## 2022-02-22 NOTE — ED Provider Notes (Signed)
[facility]  Note:  This document was prepared using Systems analyst and may include unintentional dictation errors.  MRN: [id number] DOB: 1986-01-14  Subjective:   Laurie Reed is a 36 y.o. female presenting for 2 day history of acute onset acute on chronic low back pain. She is doing a lot of bending, crouching, was working on strapping a wheelchair and was a lot of meticulous, repetitive work for her. The wheelchair itself is also heavy. Has to strap down different wheelchairs for transportation for her work.  Has used oxycodone but has not helped. No fall, trauma, numbness or tingling, saddle paresthesia, changes to bowel or urinary habits, radicular symptoms.   No current facility-administered medications for this encounter.  Current Outpatient Medications:    acetaminophen (TYLENOL) 500 MG tablet, Take 500-1,000 mg by mouth every 6 (six) hours as needed for mild pain, moderate pain or headache (or migraines). , Disp: , Rfl:    albuterol (PROVENTIL) (2.5 MG/3ML) 0.083% nebulizer solution, Use 1 vial via nebulizer every 6 hours and as needed (Patient taking differently: Take 2.5 mg by nebulization every 6 (six) hours. Shortness of breath), Disp: 150 mL, Rfl: 11   albuterol (VENTOLIN HFA) 108 (90 Base) MCG/ACT inhaler, Inhale 2 puffs into the lungs 4 (four) times daily as needed., Disp: 18 g, Rfl: 5   albuterol (VENTOLIN HFA) 108 (90 Base) MCG/ACT inhaler, Inhale 2 puffs into the lungs 4 (four) times daily as needed., Disp: 18 g, Rfl: 5   amitriptyline (ELAVIL) 25 MG tablet, Take 1 tablet (25 mg total) by mouth at bedtime as needed. (Patient taking differently: Take 25 mg by mouth at bedtime as needed for sleep.), Disp: 30 tablet, Rfl: 2   amitriptyline (ELAVIL) 25 MG tablet, Take 1 tablet (25 mg total) by mouth at bedtime as needed., Disp: 30 tablet, Rfl: 5   amoxicillin (AMOXIL) 500 MG capsule, Take 1 capsule (500 mg total) by mouth every 8  (eight) hours until gone, Disp: 21 capsule, Rfl: 0   calcium carbonate (TUMS - DOSED IN MG ELEMENTAL CALCIUM) 500 MG chewable tablet, Chew 1-3 tablets by mouth daily as needed for indigestion or heartburn., Disp: , Rfl:    cetirizine (ZYRTEC) 10 MG tablet, Take 1 tablet (10 mg total) by mouth every evening as needed for allergies., Disp: 30 tablet, Rfl: 5   Chlorphen-PE-Acetaminophen (NOREL AD) 4-10-325 MG TABS, Take 1 tablet by mouth 2 (two) times daily as needed for cough and congestion, Disp: 20 tablet, Rfl: 2   diphenhydrAMINE (BENADRYL) 25 mg capsule, Take 25-50 mg by mouth 2 (two) times daily as needed for allergies., Disp: , Rfl:    doxycycline (VIBRA-TABS) 100 MG tablet, Take 1 tablet (100 mg total) by mouth every 12 (twelve) hours. (Patient not taking: Reported on 12/24/2021), Disp: 10 tablet, Rfl: 0   ferrous sulfate 324 (65 Fe) MG TBEC, 1 pill daily after meal to help with anemia, Disp: 30 tablet, Rfl: 3   fluticasone (FLONASE ALLERGY RELIEF) 50 MCG/ACT nasal spray, Place 2 sprays into both nostrils daily., Disp: 16 g, Rfl: 5   fluticasone (FLONASE) 50 MCG/ACT nasal spray, Place 1 spray into both nostrils in the morning and at bedtime., Disp: , Rfl:    fluticasone-salmeterol (ADVAIR DISKUS) 500-50 MCG/ACT AEPB, Inhale 1 puff into the lungs in the morning and at bedtime., Disp: 60 each, Rfl: 11   hydrocortisone 2.5 % cream, Apply 2 times daily as needed for bumps, Disp: 30 g, Rfl: 2  ibuprofen (ADVIL) 800 MG tablet, Take 1 tablet (800 mg total) by mouth every 6 (six) hours as needed for pain, Disp: 21 tablet, Rfl: 0   molnupiravir EUA (LAGEVRIO) 200 MG CAPS capsule, Take 4 capsules (800 mg total) by mouth 2 (two) times daily for 5 days., Disp: 40 capsule, Rfl: 0   montelukast (SINGULAIR) 10 MG tablet, Take 1 tablet (10 mg total) by mouth at bedtime., Disp: 30 tablet, Rfl: 1   montelukast (SINGULAIR) 10 MG tablet, Take 1 tablet (10 mg total) by mouth daily., Disp: 30 tablet, Rfl: 5    omeprazole (PRILOSEC) 40 MG capsule, Take 1 capsule (40 mg total) by mouth daily., Disp: 30 capsule, Rfl: 11   oxyCODONE-acetaminophen (PERCOCET/ROXICET) 5-325 MG tablet, Take 1 tablet by mouth 2 (two) times daily as needed. (Patient taking differently: Take 1 tablet by mouth 2 (two) times daily as needed for severe pain.), Disp: 14 tablet, Rfl: 0   oxyCODONE-acetaminophen (PERCOCET/ROXICET) 5-325 MG tablet, Take 1 tablet by mouth 2 (two) times daily as needed for 7 days, Disp: 14 tablet, Rfl: 0   oxyCODONE-acetaminophen (PERCOCET/ROXICET) 5-325 MG tablet, Take 1 tablet by mouth 2 (two) times daily as needed for 7 days, Disp: 14 tablet, Rfl: 0   oxyCODONE-acetaminophen (PERCOCET/ROXICET) 5-325 MG tablet, Take 1 tablet by mouth 2 (two) times daily as needed for 7 days., Disp: 14 tablet, Rfl: 0   oxyCODONE-acetaminophen (PERCOCET/ROXICET) 5-325 MG tablet, Take 1 tablet by mouth 2 (two) times daily as needed for 7 days, Disp: 14 tablet, Rfl: 0   phentermine (ADIPEX-P) 37.5 MG tablet, Take 1 tablet (37.5 mg total) by mouth in the morning., Disp: 30 tablet, Rfl: 2   SUMAtriptan (IMITREX) 100 MG tablet, Take 1 tablet by mouth at onset of migraine symptoms, may repeat in 1-2 hours if persistent., Disp: 15 tablet, Rfl: 2   SUMAtriptan (IMITREX) 100 MG tablet, Take 1 tablet (100 mg total) by mouth as needed at onset of migraine symptoms. May repeat in 1-2 hours if persistent., Disp: 15 tablet, Rfl: 5   tiZANidine (ZANAFLEX) 4 MG tablet, Take 1 tablet by mouth twice daily as needed (Patient taking differently: Take 4 mg by mouth 2 (two) times daily as needed for muscle spasms.), Disp: 60 tablet, Rfl: 5   topiramate (TOPAMAX) 100 MG tablet, Take 1 tablet by mouth once a day, Disp: 30 tablet, Rfl: 5   topiramate (TOPAMAX) 100 MG tablet, Take 1 tablet (100 mg total) by mouth daily., Disp: 30 tablet, Rfl: 5   triamcinolone cream (KENALOG) 0.1 %, Apply 1 application topically 2 (two) times daily as needed. (Patient  taking differently: Apply 1 application  topically 2 (two) times daily as needed (itching).), Disp: 454 g, Rfl: 2   Vitamin D, Ergocalciferol, (DRISDOL) 1.25 MG (50000 UNIT) CAPS capsule, Take 1 capsule (50,000 Units total) by mouth once a week., Disp: 4 capsule, Rfl: 5   Allergies  Allergen Reactions   Latex Hives and Itching   Other Itching    "20 different types of trees"   Zofran [Ondansetron] Itching   Tree Extract Itching    Past Medical History:  Diagnosis Date   Acid reflux 2004   Anemia    Anxiety 2008   Asthma 1993   no previous intubations or hospitalizations    Carpal tunnel syndrome    Chlamydia    Depression 2008   no meds, currenly ok   Environmental allergies    GERD (gastroesophageal reflux disease)    Migraines  Miscarriage    Ovarian cyst    Pre-diabetes    Ulcer of the stomach and intestine 2004   Urinary tract infection      Past Surgical History:  Procedure Laterality Date   CARPAL TUNNEL RELEASE Left    COLONOSCOPY  2004   IRRIGATION AND DEBRIDEMENT SEBACEOUS CYST N/A 06/01/2017   Procedure: EXCISION SEBACEOUS CYST ON SCALP;  Surgeon: Ancil Linsey, MD;  Location: [facility];  Service: General;  Laterality: N/A;   MULTIPLE TOOTH EXTRACTIONS  2015   6 teeth removed    TUBAL LIGATION N/A 04/04/2018   Procedure: POST PARTUM TUBAL LIGATION;  Surgeon: Levie Heritage, DO;  Location: [facility];  Service: Gynecology;  Laterality: N/A;   WISDOM TOOTH EXTRACTION  2007    Family History  Problem Relation Age of Onset   Asthma Mother    Heart disease Mother    Hypertension Mother    Clotting disorder Mother    Diabetes Mother    Asthma Maternal Aunt    Hypertension Maternal Aunt    Diabetes Maternal Aunt    Asthma Maternal Uncle    Hypertension Maternal Uncle    Diabetes Maternal Uncle    Heart disease Maternal Grandmother    Diabetes Maternal Grandmother    Hypertension Maternal Grandmother    Heart disease Maternal Grandfather     Diabetes Maternal Grandfather    Hypertension Maternal Grandfather    Hypertension Paternal Grandmother    Diabetes Maternal Uncle    Heart disease Maternal Uncle    Diabetes Maternal Uncle    Cancer Paternal Aunt    Hypotension Neg Hx    Anesthesia problems Neg Hx    Malignant hyperthermia Neg Hx    Pseudochol deficiency Neg Hx     Social History   Tobacco Use   Smoking status: Never   Smokeless tobacco: Never  Vaping Use   Vaping Use: Never used  Substance Use Topics   Alcohol use: No    Alcohol/week: 0.0 standard drinks of alcohol   Drug use: No    ROS   Objective:   Vitals: BP 136/73 (BP Location: Right Arm)   Pulse 100   Temp 98.6 F (37 C) (Oral)   Resp 16   LMP 02/03/2022 (Approximate)   SpO2 98%   Physical Exam Constitutional:      General: She is not in acute distress.    Appearance: Normal appearance. She is well-developed. She is not ill-appearing, toxic-appearing or diaphoretic.  HENT:     Head: Normocephalic and atraumatic.     Nose: Nose normal.     Mouth/Throat:     Mouth: Mucous membranes are moist.  Eyes:     General: No scleral icterus.       Right eye: No discharge.        Left eye: No discharge.     Extraocular Movements: Extraocular movements intact.  Cardiovascular:     Rate and Rhythm: Normal rate.  Pulmonary:     Effort: Pulmonary effort is normal.  Musculoskeletal:     Cervical back: No swelling, edema, deformity, erythema, signs of trauma, lacerations, rigidity, spasms, torticollis, tenderness, bony tenderness or crepitus. No pain with movement. Normal range of motion.     Thoracic back: Spasms and tenderness present. No swelling, edema, deformity, signs of trauma, lacerations or bony tenderness. Normal range of motion. No scoliosis.     Lumbar back: Spasms and tenderness present. No swelling, edema, deformity, signs of trauma, lacerations or  bony tenderness. Decreased range of motion. Negative right straight leg raise test and  negative left straight leg raise test. No scoliosis.     Comments: Paraspinal muscle tenderness on either side of the thoracic and lumbar region with associated spasms.  Skin:    General: Skin is warm and dry.  Neurological:     General: No focal deficit present.     Mental Status: She is alert and oriented to person, place, and time.  Psychiatric:        Mood and Affect: Mood normal.        Behavior: Behavior normal.        Thought Content: Thought content normal.        Judgment: Judgment normal.     Narrative & Impression  CLINICAL DATA:  Left-sided low back pain and left leg pain.   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 This differs from the numbering terminology employed on the MRI March 2018.   There is mild curvature convex to the left. No antero or retrolisthesis. No disc narrowing seen at L4-5 or above. The L5-S1 disc is transitional and rudimentary. 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 These could be a cause of localized pain or referred pain. 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     Electronically Signed   By: Paulina Fusi M.D.   On: 12/14/2019 15:01    Assessment and Plan :   PDMP not reviewed this encounter.  1. Lumbar strain, initial encounter   2. Acute bilateral thoracic back pain   3. Acute bilateral low back pain without sciatica     Use prednisone course given severity of her pain.   Patient has previously used ibuprofen but does not help as much.  However, I suspect that with prednisone her symptoms would be helped enough that ibuprofen would work going forward.  Therefore will use 5 days of prednisone together with tizanidine.  Hold the narcotic pain medicine.  Given lack of trauma will defer imaging. Counseled patient on potential for adverse effects with medications prescribed/recommended today, ER and return-to-clinic precautions discussed, patient verbalized understanding.    Wallis Bamberg, PA-C 02/22/22 1511

## 2022-02-22 NOTE — ED Triage Notes (Signed)
Patient presents to [facility] for back pain since Saturday. Pt states related to the type of work she does requiring lots of bending. Pt taking oxy and using heat pads with no relief.

## 2022-02-24 ENCOUNTER — Ambulatory Visit: Payer: BLUE CROSS/BLUE SHIELD | Admitting: Internal Medicine

## 2022-02-26 ENCOUNTER — Other Ambulatory Visit (HOSPITAL_COMMUNITY): Payer: Self-pay

## 2022-02-26 MED ORDER — OXYCODONE-ACETAMINOPHEN 5-325 MG PO TABS
1.0000 | ORAL_TABLET | Freq: Two times a day (BID) | ORAL | 0 refills | Status: AC | PRN
  Filled 2022-02-26: qty 14, 7d supply, fill #0

## 2022-03-02 ENCOUNTER — Other Ambulatory Visit (HOSPITAL_COMMUNITY): Payer: Self-pay

## 2022-03-03 ENCOUNTER — Other Ambulatory Visit (HOSPITAL_COMMUNITY): Payer: Self-pay

## 2022-03-05 ENCOUNTER — Other Ambulatory Visit (HOSPITAL_COMMUNITY): Payer: Self-pay

## 2022-03-05 MED ORDER — OXYCODONE-ACETAMINOPHEN 5-325 MG PO TABS
1.0000 | ORAL_TABLET | Freq: Two times a day (BID) | ORAL | 0 refills | Status: DC | PRN
  Filled 2022-03-05: qty 14, 7d supply, fill #0

## 2022-03-12 ENCOUNTER — Other Ambulatory Visit (HOSPITAL_COMMUNITY): Payer: Self-pay

## 2022-03-12 MED ORDER — OXYCODONE-ACETAMINOPHEN 5-325 MG PO TABS
1.0000 | ORAL_TABLET | Freq: Two times a day (BID) | ORAL | 0 refills | Status: DC | PRN
  Filled 2022-03-12: qty 14, 7d supply, fill #0

## 2022-03-16 ENCOUNTER — Other Ambulatory Visit (HOSPITAL_COMMUNITY): Payer: Self-pay

## 2022-03-19 ENCOUNTER — Other Ambulatory Visit (HOSPITAL_COMMUNITY): Payer: Self-pay

## 2022-03-19 MED ORDER — OXYCODONE-ACETAMINOPHEN 5-325 MG PO TABS
1.0000 | ORAL_TABLET | Freq: Two times a day (BID) | ORAL | 0 refills | Status: DC
  Filled 2022-03-19: qty 14, 7d supply, fill #0

## 2022-03-26 ENCOUNTER — Other Ambulatory Visit (HOSPITAL_COMMUNITY): Payer: Self-pay

## 2022-03-26 MED ORDER — OXYCODONE-ACETAMINOPHEN 5-325 MG PO TABS
1.0000 | ORAL_TABLET | Freq: Two times a day (BID) | ORAL | 0 refills | Status: DC | PRN
  Filled 2022-03-26: qty 14, 7d supply, fill #0

## 2022-04-01 ENCOUNTER — Other Ambulatory Visit (HOSPITAL_COMMUNITY): Payer: Self-pay

## 2022-04-02 ENCOUNTER — Other Ambulatory Visit (HOSPITAL_COMMUNITY): Payer: Self-pay

## 2022-04-02 MED ORDER — TRIAMCINOLONE ACETONIDE 0.1 % EX CREA
1.0000 | TOPICAL_CREAM | Freq: Two times a day (BID) | CUTANEOUS | 2 refills | Status: DC | PRN
  Filled 2022-04-02: qty 454, 30d supply, fill #0
  Filled 2022-06-11 – 2022-07-07 (×2): qty 454, 30d supply, fill #1
  Filled 2022-09-17: qty 454, 30d supply, fill #2

## 2022-04-02 MED ORDER — OXYCODONE-ACETAMINOPHEN 5-325 MG PO TABS
1.0000 | ORAL_TABLET | Freq: Two times a day (BID) | ORAL | 0 refills | Status: DC | PRN
  Filled 2022-04-02: qty 20, 10d supply, fill #0

## 2022-04-05 ENCOUNTER — Other Ambulatory Visit (HOSPITAL_COMMUNITY): Payer: Self-pay

## 2022-04-11 ENCOUNTER — Encounter (HOSPITAL_COMMUNITY): Payer: Self-pay

## 2022-04-11 ENCOUNTER — Other Ambulatory Visit: Payer: Self-pay

## 2022-04-11 ENCOUNTER — Emergency Department (HOSPITAL_COMMUNITY)
Admission: EM | Admit: 2022-04-11 | Discharge: 2022-04-12 | Disposition: A | Discharge status: Home / Self Care | Payer: Medicaid Other | Attending: Emergency Medicine | Admitting: Emergency Medicine

## 2022-04-11 DIAGNOSIS — J45909 Unspecified asthma, uncomplicated: Secondary | ICD-10-CM | POA: Insufficient documentation

## 2022-04-11 DIAGNOSIS — Z1152 Encounter for screening for COVID-19: Secondary | ICD-10-CM | POA: Insufficient documentation

## 2022-04-11 DIAGNOSIS — Z7951 Long term (current) use of inhaled steroids: Secondary | ICD-10-CM | POA: Insufficient documentation

## 2022-04-11 DIAGNOSIS — R519 Headache, unspecified: Secondary | ICD-10-CM | POA: Insufficient documentation

## 2022-04-11 DIAGNOSIS — R0602 Shortness of breath: Secondary | ICD-10-CM | POA: Insufficient documentation

## 2022-04-11 DIAGNOSIS — Z8616 Personal history of COVID-19: Secondary | ICD-10-CM | POA: Diagnosis not present

## 2022-04-11 DIAGNOSIS — Z91040 Latex allergy status: Secondary | ICD-10-CM | POA: Diagnosis not present

## 2022-04-11 LAB — CBC WITH DIFFERENTIAL/PLATELET
Abs Immature Granulocytes: 0 10*3/uL (ref 0.00–0.07)
Basophils Absolute: 0.1 10*3/uL (ref 0.0–0.1)
Basophils Relative: 1 %
Eosinophils Absolute: 0.3 10*3/uL (ref 0.0–0.5)
Eosinophils Relative: 2 %
HCT: 36.3 % (ref 36.0–46.0)
Hemoglobin: 11.7 g/dL — ABNORMAL LOW (ref 12.0–15.0)
Lymphocytes Relative: 50 %
Lymphs Abs: 6.3 10*3/uL — ABNORMAL HIGH (ref 0.7–4.0)
MCH: 26.5 pg (ref 26.0–34.0)
MCHC: 32.2 g/dL (ref 30.0–36.0)
MCV: 82.1 fL (ref 80.0–100.0)
Monocytes Absolute: 0.4 10*3/uL (ref 0.1–1.0)
Monocytes Relative: 3 %
Neutro Abs: 5.5 10*3/uL (ref 1.7–7.7)
Neutrophils Relative %: 44 %
Platelets: 348 10*3/uL (ref 150–400)
RBC: 4.42 MIL/uL (ref 3.87–5.11)
RDW: 14.4 % (ref 11.5–15.5)
WBC: 12.5 10*3/uL — ABNORMAL HIGH (ref 4.0–10.5)
nRBC: 0 % (ref 0.0–0.2)
nRBC: 0 /100 WBC

## 2022-04-11 LAB — BASIC METABOLIC PANEL
Anion gap: 11 (ref 5–15)
BUN: 9 mg/dL (ref 6–20)
CO2: 25 mmol/L (ref 22–32)
Calcium: 8.8 mg/dL — ABNORMAL LOW (ref 8.9–10.3)
Chloride: 102 mmol/L (ref 98–111)
Creatinine, Ser: 0.78 mg/dL (ref 0.44–1.00)
GFR, Estimated: >60 mL/min (ref >60)
Glucose, Bld: 90 mg/dL (ref 70–99)
Potassium: 3.7 mmol/L (ref 3.5–5.1)
Sodium: 138 mmol/L (ref 135–145)

## 2022-04-11 LAB — I-STAT BETA HCG BLOOD, ED (MC, WL, AP ONLY): I-stat hCG, quantitative: <5 m[IU]/mL (ref <5)

## 2022-04-11 NOTE — ED Triage Notes (Signed)
Pt reports shortness of breath, headache and left leg pain onset 6 days ago. She was seen at [facility] on 11/24 for DVT rule out. Was told not DVT and diagnosed with cellulitis. She reports the leg pain has not resolved. Denies CP/abd pain.

## 2022-04-11 NOTE — ED Provider Triage Note (Signed)
Emergency Medicine Provider Triage Evaluation Note  Laurie Reed , a 36 y.o. female  was evaluated in triage.  Pt complains of left leg pain. Seen at [facility]. [place] neg. Dx with cellulitis. Still has pain and swelling. No numbness or weakness. Some lightheadedness and HA today. No back pain, fever, bowel/ bladder incontinence, saddle anesthesia.  Review of Systems  Positive: Left leg pain, HA, lightheadedness Negative: Fever, back pain  Physical Exam  BP (!) 145/91   Pulse (!) 113   Temp 98.5 F (36.9 C) (Oral)   Resp 20   SpO2 100%  Gen:   Awake, no distress   Resp:  Normal effort  MSK:   Moves extremities without difficulty  Neuro:  Moves extremities, Ambulatory Other:    Medical Decision Making  Medically screening exam initiated at 8:52 PM.  Appropriate orders placed.  Laurie Reed was informed that the remainder of the evaluation will be completed by another provider, this initial triage assessment does not replace that evaluation, and the importance of remaining in the ED until their evaluation is complete.  Left leg pain, HA, lightheadedness   Laurie Reed A, PA-C 04/11/22 2056

## 2022-04-12 ENCOUNTER — Emergency Department (HOSPITAL_COMMUNITY): Payer: Medicaid Other

## 2022-04-12 ENCOUNTER — Other Ambulatory Visit (HOSPITAL_COMMUNITY): Payer: Self-pay

## 2022-04-12 LAB — RESP PANEL BY RT-PCR (FLU A&B, COVID) ARPGX2
Influenza A by PCR: NEGATIVE
Influenza B by PCR: NEGATIVE
SARS Coronavirus 2 by RT PCR: NEGATIVE

## 2022-04-12 LAB — D-DIMER, QUANTITATIVE: D-Dimer, Quant: 0.73 ug/mL-FEU — ABNORMAL HIGH (ref 0.00–0.50)

## 2022-04-12 MED ORDER — OXYCODONE-ACETAMINOPHEN 5-325 MG PO TABS
1.0000 | ORAL_TABLET | Freq: Two times a day (BID) | ORAL | 0 refills | Status: DC | PRN
  Filled 2022-04-12: qty 20, 10d supply, fill #0

## 2022-04-12 MED ORDER — IOHEXOL 350 MG/ML SOLN
75.0000 mL | Freq: Once | INTRAVENOUS | Status: AC | PRN
  Administered 2022-04-12: 75 mL via INTRAVENOUS

## 2022-04-12 MED ORDER — METOCLOPRAMIDE HCL 5 MG/ML IJ SOLN
10.0000 mg | Freq: Once | INTRAMUSCULAR | Status: AC
  Administered 2022-04-12: 10 mg via INTRAVENOUS
  Filled 2022-04-12: qty 2

## 2022-04-12 MED ORDER — KETOROLAC TROMETHAMINE 15 MG/ML IJ SOLN
15.0000 mg | Freq: Once | INTRAMUSCULAR | Status: AC
  Administered 2022-04-12: 15 mg via INTRAVENOUS
  Filled 2022-04-12: qty 1

## 2022-04-12 MED ORDER — LACTATED RINGERS IV BOLUS
1000.0000 mL | Freq: Once | INTRAVENOUS | Status: AC
  Administered 2022-04-12: 1000 mL via INTRAVENOUS

## 2022-04-12 MED ORDER — DIPHENHYDRAMINE HCL 25 MG PO CAPS
25.0000 mg | ORAL_CAPSULE | Freq: Once | ORAL | Status: DC
  Filled 2022-04-12: qty 1

## 2022-04-12 MED ORDER — ACETAMINOPHEN 500 MG PO TABS
1000.0000 mg | ORAL_TABLET | Freq: Once | ORAL | Status: AC
  Administered 2022-04-12: 1000 mg via ORAL
  Filled 2022-04-12: qty 2

## 2022-04-12 NOTE — ED Provider Notes (Signed)
[facility] EMERGENCY DEPARTMENT Provider Note   CSN: [id number] Arrival date & time: 04/11/22  2043     History  Chief Complaint  Patient presents with   Shortness of Breath   Headache    Laurie Reed is a 36 y.o. female.   Shortness of Breath Associated symptoms: headaches   Headache  The patient is a 36 year old female past medical history of asthma with prior hospitalization for acute hypoxic respiratory failure requiring ICU admission and BiPAP, and chronic migraines presenting for evaluation of shortness of breath, headache, and left leg pain.  The patient was previously seen in OSH on 11/20 for the left leg pain which she received a DVT study which was negative.  At her previous emergency department visit she was diagnosed with cellulitis of the left leg and given a prescription for Keflex which she has currently been taking.  She states that the pain has persisted over the past week.  She is also endorsing headache which began 3 days ago and has not improved with use of her migraine medication.  She states that her shortness of breath is exertional and improved with rest and that that has been going on for some time.  She denies vision changes, nausea, vomiting, fevers, cough.  She does also endorse dizziness, lightheadedness, runny nose, and congestion.  She does not have any associated chest pain.    Home Medications Prior to Admission medications   Medication Sig Start Date End Date Taking? Authorizing Provider  acetaminophen (TYLENOL) 500 MG tablet Take 500-1,000 mg by mouth every 6 (six) hours as needed for mild pain, moderate pain or headache (or migraines).     [provider]  albuterol (PROVENTIL) (2.5 MG/3ML) 0.083% nebulizer solution Use 1 vial via nebulizer every 6 hours and as needed Patient taking differently: Take 2.5 mg by nebulization every 6 (six) hours. Shortness of breath 05/15/21     albuterol (VENTOLIN HFA) 108 (90 Base)  MCG/ACT inhaler Inhale 2 puffs into the lungs 4 (four) times daily as needed. 05/15/21     albuterol (VENTOLIN HFA) 108 (90 Base) MCG/ACT inhaler Inhale 2 puffs into the lungs 4 (four) times daily as needed. 12/09/21     amitriptyline (ELAVIL) 25 MG tablet Take 1 tablet (25 mg total) by mouth at bedtime as needed. Patient taking differently: Take 25 mg by mouth at bedtime as needed for sleep. 09/16/21     amitriptyline (ELAVIL) 25 MG tablet Take 1 tablet (25 mg total) by mouth at bedtime as needed. 12/09/21     amoxicillin (AMOXIL) 500 MG capsule Take 1 capsule (500 mg total) by mouth every 8 (eight) hours until gone 02/10/22     calcium carbonate (TUMS - DOSED IN MG ELEMENTAL CALCIUM) 500 MG chewable tablet Chew 1-3 tablets by mouth daily as needed for indigestion or heartburn.    [provider]  cetirizine (ZYRTEC) 10 MG tablet Take 1 tablet (10 mg total) by mouth every evening as needed for allergies. 12/09/21     Chlorphen-PE-Acetaminophen (NOREL AD) 4-10-325 MG TABS Take 1 tablet by mouth 2 (two) times daily as needed for cough and congestion 01/25/22     diphenhydrAMINE (BENADRYL) 25 mg capsule Take 25-50 mg by mouth 2 (two) times daily as needed for allergies.    [provider]  doxycycline (VIBRA-TABS) 100 MG tablet Take 1 tablet (100 mg total) by mouth every 12 (twelve) hours. Patient not taking: Reported on 12/24/2021 11/27/21   Talitha Givens, NP  ferrous sulfate 324 (65 Fe) MG TBEC 1 pill daily after meal to help with anemia 11/22/18   [name], [name], MD  fluticasone (FLONASE ALLERGY RELIEF) 50 MCG/ACT nasal spray Place 2 sprays into both nostrils daily. 12/09/21     fluticasone-salmeterol (ADVAIR DISKUS) 500-50 MCG/ACT AEPB Inhale 1 puff into the lungs in the morning and at bedtime. 12/24/21   Charlott Holleresai, Nikita S, MD  hydrocortisone 2.5 % cream Apply 2 times daily as needed for bumps 05/15/21     ibuprofen (ADVIL) 800 MG tablet Take 1 tablet (800 mg total) by mouth every 6 (six)  hours as needed for pain 02/10/22     molnupiravir EUA (LAGEVRIO) 200 MG CAPS capsule Take 4 capsules (800 mg total) by mouth 2 (two) times daily for 5 days. 01/25/22     montelukast (SINGULAIR) 10 MG tablet Take 1 tablet (10 mg total) by mouth daily. 12/09/21     omeprazole (PRILOSEC) 40 MG capsule Take 1 capsule (40 mg total) by mouth daily. 12/24/21 12/24/22  Charlott Holleresai, Nikita S, MD  oxyCODONE-acetaminophen (PERCOCET/ROXICET) 5-325 MG tablet Take 1 tablet by mouth 2 (two) times daily as needed for 10 days 04/12/22     phentermine (ADIPEX-P) 37.5 MG tablet Take 1 tablet (37.5 mg total) by mouth in the morning. 10/14/21     predniSONE (DELTASONE) 50 MG tablet Take 1 tablet (50 mg total) by mouth daily with breakfast. 02/22/22   Wallis BambergMani, Mario, PA-C  SUMAtriptan (IMITREX) 100 MG tablet Take 1 tablet (100 mg total) by mouth as needed at onset of migraine symptoms. May repeat in 1-2 hours if persistent. 12/09/21     tiZANidine (ZANAFLEX) 4 MG tablet Take 1 tablet (4 mg total) by mouth at bedtime. 02/22/22   Wallis BambergMani, Mario, PA-C  topiramate (TOPAMAX) 100 MG tablet Take 1 tablet (100 mg total) by mouth daily. 12/09/21     triamcinolone cream (KENALOG) 0.1 % Apply 1 Application topically 2 (two) times daily as needed. 04/05/22     Vitamin D, Ergocalciferol, (DRISDOL) 1.25 MG (50000 UNIT) CAPS capsule Take 1 capsule (50,000 Units total) by mouth once a week. 07/21/21     dicyclomine (BENTYL) 10 MG capsule Take 1 capsule (10 mg total) by mouth 4 (four) times daily -  before meals and at bedtime. For stomach cramping Patient not taking: Reported on 10/25/2019 06/13/19 10/25/19  [name], Hewitt Shortsammie, MD  sucralfate (CARAFATE) 1 GM/10ML suspension Take 10 mLs (1 g total) by mouth 4 (four) times daily -  with meals and at bedtime. Patient not taking: Reported on 10/25/2019 06/13/19 10/25/19  Cain SaupeFulp, [name], MD      Allergies    Latex, Other, Zofran [ondansetron], and Tree extract    Review of Systems   Review of Systems  Respiratory:   Positive for shortness of breath.   Neurological:  Positive for headaches.   See HPI  Physical Exam Updated Vital Signs BP 93/61   Pulse 82   Temp 98.2 F (36.8 C) (Oral)   Resp 12   Ht 5\' 2"  (1.575 m)   Wt 125.6 kg   LMP 03/30/2022 (Approximate)   SpO2 100%   BMI 50.66 kg/m  Physical Exam Vitals and nursing note reviewed.  Constitutional:      General: She is not in acute distress.    Appearance: She is well-developed. She is obese. She is not ill-appearing.  HENT:     Head: Normocephalic and atraumatic.  Eyes:     Conjunctiva/sclera: Conjunctivae normal.  Cardiovascular:  Rate and Rhythm: Normal rate and regular rhythm.     Heart sounds: No murmur heard. Pulmonary:     Effort: Pulmonary effort is normal. No respiratory distress.     Breath sounds: Normal breath sounds.  Abdominal:     Palpations: Abdomen is soft.     Tenderness: There is no abdominal tenderness.  Musculoskeletal:        General: No swelling. Normal range of motion.     Cervical back: Neck supple.  Skin:    General: Skin is warm and dry.     Capillary Refill: Capillary refill takes less than 2 seconds.  Neurological:     General: No focal deficit present.     Mental Status: She is alert and oriented to person, place, and time.  Psychiatric:        Mood and Affect: Mood normal.     ED Results / Procedures / Treatments   Labs (all labs ordered are listed, but only abnormal results are displayed) Labs Reviewed  CBC WITH DIFFERENTIAL/PLATELET - Abnormal; Notable for the following components:      Result Value   WBC 12.5 (*)    Hemoglobin 11.7 (*)    Lymphs Abs 6.3 (*)    All other components within normal limits  BASIC METABOLIC PANEL - Abnormal; Notable for the following components:   Calcium 8.8 (*)    All other components within normal limits  D-DIMER, QUANTITATIVE - Abnormal; Notable for the following components:   D-Dimer, Quant 0.73 (*)    All other components within normal  limits  RESP PANEL BY RT-PCR (FLU A&B, COVID) ARPGX2  I-STAT BETA HCG BLOOD, ED ([facility], [facility], [facility] ONLY)    EKG EKG Interpretation  Date/Time:  Sunday April 11 2022 20:55:45 EST Ventricular Rate:  111 PR Interval:  152 QRS Duration: 94 QT Interval:  352 QTC Calculation: 478 R Axis:   49 Text Interpretation: Sinus tachycardia Incomplete right bundle branch block  no significant change since July 2023 Confirmed by Pricilla Loveless [phone]) on 04/12/2022 7:00:43 AM  Radiology CT Angio Chest PE W and/or Wo Contrast  Result Date: 04/12/2022 CLINICAL DATA:  A 37 year old female presents for evaluation of suspected pulmonary embolism. EXAM: CT ANGIOGRAPHY CHEST WITH CONTRAST TECHNIQUE: Multidetector CT imaging of the chest was performed using the standard protocol during bolus administration of intravenous contrast. Multiplanar CT image reconstructions and MIPs were obtained to evaluate the vascular anatomy. RADIATION DOSE REDUCTION: This exam was performed according to the departmental dose-optimization program which includes automated exposure control, adjustment of the mA and/or kV according to patient size and/or use of iterative reconstruction technique. CONTRAST:  52mL OMNIPAQUE IOHEXOL 350 MG/ML SOLN COMPARISON:  November 24, 2021. FINDINGS: Cardiovascular: Normal caliber of the thoracic aorta. No adjacent stranding. Limited assessment of the aorta due to bolus timing optimized for evaluation of pulmonary arterial structures. Main pulmonary artery is opacified to 357 Hounsfield units. These study is negative for pulmonary embolism. Mediastinum/Nodes: No signs of adenopathy in the chest. Lungs/Pleura: 5 mm RIGHT upper lobe pulmonary nodule (image 53/6) not changed since June of 2021 and compatible with benign pulmonary nodule for which no additional dedicated follow-up imaging is recommended. No pneumothorax. No pleural effusion. No consolidative changes. Airways are patent. Upper Abdomen: Incidental  imaging of the upper abdomen is unremarkable to the extent evaluated. Imaged portions the liver, gallbladder, pancreas, spleen, adrenal glands and kidneys without acute findings. Musculoskeletal: No acute or destructive bone process. No chest wall mass. Review of the  MIP images confirms the above findings. IMPRESSION: 1. These study is negative for pulmonary embolism. 2. No acute cardiopulmonary findings. Electronically Signed   By: Donzetta Kohut M.D.   On: 04/12/2022 12:12   DG Chest Portable 1 View  Result Date: 04/12/2022 CLINICAL DATA:  SOB EXAM: PORTABLE CHEST 1 VIEW COMPARISON:  11/24/2021. FINDINGS: The heart size and mediastinal contours are within normal limits. Both lungs are clear. No pneumothorax or pleural effusion. The visualized skeletal structures are unremarkable. IMPRESSION: No active disease. Electronically Signed   By: Layla Maw M.D.   On: 04/12/2022 08:37   CT Head Wo Contrast  Result Date: 04/12/2022 CLINICAL DATA:  Headache, increasing frequency or severity EXAM: CT HEAD WITHOUT CONTRAST TECHNIQUE: Contiguous axial images were obtained from the base of the skull through the vertex without intravenous contrast. RADIATION DOSE REDUCTION: This exam was performed according to the departmental dose-optimization program which includes automated exposure control, adjustment of the mA and/or kV according to patient size and/or use of iterative reconstruction technique. COMPARISON:  None Available. FINDINGS: Brain: No evidence of acute infarction, hemorrhage, hydrocephalus, extra-axial collection or mass lesion/mass effect. Note is made of a partially empty sella. Vascular: No hyperdense vessel or unexpected calcification. Skull: Normal. Negative for fracture or focal lesion. Sinuses/Orbits: No acute finding. IMPRESSION: Partially empty sella, a stable finding. Otherwise no acute intracranial process. Electronically Signed   By: Layla Maw M.D.   On: 04/12/2022 08:25     Procedures Procedures    Medications Ordered in ED Medications  diphenhydrAMINE (BENADRYL) capsule 25 mg (25 mg Oral Not Given 04/12/22 5427)  acetaminophen (TYLENOL) tablet 1,000 mg (1,000 mg Oral Given 04/12/22 0825)  lactated ringers bolus 1,000 mL (0 mLs Intravenous Stopped 04/12/22 1332)  metoCLOPramide (REGLAN) injection 10 mg (10 mg Intravenous Given 04/12/22 0831)  ketorolac (TORADOL) 15 MG/ML injection 15 mg (15 mg Intravenous Given 04/12/22 0828)  iohexol (OMNIPAQUE) 350 MG/ML injection 75 mL (75 mLs Intravenous Contrast Given 04/12/22 1154)    ED Course/ Medical Decision Making/ A&P                           Medical Decision Making The patient is a 36 year old female past medical history of asthma with prior hospitalization for acute hypoxic respiratory failure requiring ICU admission and BiPAP, and chronic migraines presenting for evaluation of shortness of breath, headache, and left leg pain.  The differential diagnosis considered includes: Pulmonary embolism, pneumonia, viral URI, DVT, CVA.  On initial evaluation, the patient was hemodynamically stable and afebrile.  She had no hypoxia, tachycardia, or tachypnea and there was no associated increased work of breathing.  The patient's physical exam was significant for clear lung sounds bilaterally, a normal neurological exam without abnormal findings, and no lower extremity asymmetry, tenderness to palpation, warmth, or signs of infection.  The patient's diagnostic workup included: COVID and influenza testing which was negative; CBC with white blood cell count of 12.5 and hemoglobin of 11.7; BMP without significant metabolic abnormality; hCG which was negative; and D-dimer elevated to 0.73.  Based on the patient's elevated D-dimer a CTA PE study was ordered which did not show any signs of thromboembolic disease.  The patient received a CT head which showed a partial empty sella which has been previously documented without  acute intracranial abnormality.  She also received a chest x-ray which not show any signs of focal consolidation to suggest pneumonia, pneumothorax, rib fracture, or other cardiopulmonary abnormality.  The patient also received an EKG which showed an incomplete right bundle branch block with sinus tachycardia but no signs of ST segment elevation.  While in the emergency department, the patient was given a bolus of IV fluid and a dose of Reglan, Toradol, and Tylenol with reported improvement in her headache.  Based on the patient's history, physical exam, and diagnostic workup I doubt pulmonary embolism, pneumonia, or trauma.  The patient's negative CT head, normal neurological exam, and improvement with migraine cocktail makes CVA less likely regarding the patient's new headache presentation.  The patient did not have any signs of DVT on physical exam and had negative DVT studies at OSH several days prior making this a less likely cause of her leg pain.  Patient was diagnosed with instructions to follow-up with her PCP in 24-48 hours for reevaluation, strict return precautions to the emergency department.   Amount and/or Complexity of Data Reviewed External Data Reviewed: labs and notes. Labs: ordered. Radiology: ordered.  Risk Prescription drug management.   Patient's presentation is most consistent with acute complicated illness / injury requiring diagnostic workup.         Final Clinical Impression(s) / ED Diagnoses Final diagnoses:  Bad headache  Shortness of breath    Rx / DC Orders ED Discharge Orders     None         Knox Saliva, MD 04/12/22 1356    Pricilla Loveless, MD 04/20/22 [phone]

## 2022-04-12 NOTE — ED Notes (Signed)
Pt asked this tech for tylenol, advised triage RN and PA what the pt was requesting.

## 2022-04-12 NOTE — ED Notes (Signed)
Pt wheeled back completely asleep and in no signs of distress.

## 2022-04-12 NOTE — ED Notes (Signed)
Patient verbalizes understanding of discharge instructions. Opportunity for questioning and answers were provided. Pt discharged from ED. 

## 2022-04-12 NOTE — ED Notes (Signed)
PT reports she got first does of keflex on Friday and has been taking since then. She has not noticed an improvement in symptoms. RN asked that she take off pants so leg can be assessed. RN asked if she had noticed any redness and she reports she could not see back there.

## 2022-04-21 ENCOUNTER — Other Ambulatory Visit (HOSPITAL_COMMUNITY): Payer: Self-pay

## 2022-04-21 MED ORDER — GABAPENTIN 100 MG PO CAPS
100.0000 mg | ORAL_CAPSULE | Freq: Three times a day (TID) | ORAL | 2 refills | Status: DC
  Filled 2022-04-21: qty 90, 30d supply, fill #0
  Filled 2022-06-11: qty 90, 30d supply, fill #1

## 2022-04-21 MED ORDER — PHENTERMINE HCL 37.5 MG PO TABS
37.5000 mg | ORAL_TABLET | Freq: Every morning | ORAL | 2 refills | Status: DC
  Filled 2022-04-21 – 2022-07-12 (×2): qty 30, 30d supply, fill #0
  Filled 2022-09-17: qty 30, 30d supply, fill #1

## 2022-04-23 ENCOUNTER — Encounter: Payer: Self-pay | Admitting: Primary Care

## 2022-04-23 ENCOUNTER — Other Ambulatory Visit (HOSPITAL_COMMUNITY): Payer: Self-pay

## 2022-04-23 ENCOUNTER — Ambulatory Visit (INDEPENDENT_AMBULATORY_CARE_PROVIDER_SITE_OTHER): Payer: Medicaid Other | Admitting: Primary Care

## 2022-04-23 VITALS — BP 112/68 | HR 102 | Temp 98.3°F | Ht 62.0 in | Wt 286.0 lb

## 2022-04-23 DIAGNOSIS — J301 Allergic rhinitis due to pollen: Secondary | ICD-10-CM

## 2022-04-23 DIAGNOSIS — J4531 Mild persistent asthma with (acute) exacerbation: Secondary | ICD-10-CM

## 2022-04-23 MED ORDER — AMOXICILLIN-POT CLAVULANATE 875-125 MG PO TABS
1.0000 | ORAL_TABLET | Freq: Two times a day (BID) | ORAL | 0 refills | Status: DC
  Filled 2022-04-23: qty 14, 7d supply, fill #0

## 2022-04-23 MED ORDER — OXYCODONE-ACETAMINOPHEN 5-325 MG PO TABS
1.0000 | ORAL_TABLET | Freq: Two times a day (BID) | ORAL | 0 refills | Status: DC
  Filled 2022-04-23: qty 20, 10d supply, fill #0

## 2022-04-23 MED ORDER — PREDNISONE 10 MG PO TABS
ORAL_TABLET | ORAL | 0 refills | Status: AC
  Filled 2022-04-23: qty 20, 8d supply, fill #0

## 2022-04-23 NOTE — Patient Instructions (Addendum)
Recommendations: - Continue Advair 500mcg - one puff twice daily - Continue Singulair, zyrtec and flonase as directed  - Start saline nasal irrigation - use once daily for nasal congestion  Rx: - Augmentin and prednisone taper   Orders: - FENO  Referral: - Allergy re: allergic rhinitis   Follow-up: - 6 months with Dr. Desai or sooner if needed  

## 2022-04-23 NOTE — Progress Notes (Unsigned)
@Patient  ID: Laurie Reed, female    DOB: 10/14/1985, 36 y.o.   MRN: [id number]  Chief Complaint  Patient presents with   Follow-up    Asthma Breathing has been horrible.   Been having congestion for a few weeks  ACT:8     Referring provider: Nolene Ebbs, MD  HPI: 36 year old female, never smoked.  Past medical history significant for severe persistent asthma. Patient of Dr. Shearon Stalls.  04/23/2022 Patient presents today for an overdue follow-up for severe persistent asthma.  She was last seen by Dr. Shearon Stalls in August. Her breathing has been worse. During her last visit she was changed form Symbicort to Advair 578mcg one puff twice daily. She has had several ED visit since August which were not asthma related.   She is really congested. Symptoms started last month. Sputum is clear to yellow.  Symptoms did worsen when she started advair She experience shortness of breath symptoms daily. Dyspnea occurs when working or cleaning. Occasional chest tightness. No wheezing.  Not much cough. Maintained on Singulair, zyrtec and flonase Uses albuterol twice a day, helps sometimes   ACT score - 8  Allergies  Allergen Reactions   Latex Hives and Itching   Other Itching    "20 different types of trees"   Zofran [Ondansetron] Itching   Tree Extract Itching    Immunization History  Administered Date(s) Administered   Influenza,inj,Quad PF,6+ Mos 03/18/2019   PPD Test 12/31/2015, 01/12/2016   Pneumococcal Polysaccharide-23 04/12/2013   Tdap 04/11/2013, 01/06/2018    Past Medical History:  Diagnosis Date   Acid reflux 2004   Anemia    Anxiety 2008   Asthma 1993   no previous intubations or hospitalizations    Carpal tunnel syndrome    Chlamydia    Depression 2008   no meds, currenly ok   Environmental allergies    GERD (gastroesophageal reflux disease)    Migraines    Miscarriage    Ovarian cyst    Pre-diabetes    Ulcer of the stomach and intestine 2004   Urinary tract  infection     Tobacco History: Social History   Tobacco Use  Smoking Status Never  Smokeless Tobacco Never   Counseling given: Not Answered   Outpatient Medications Prior to Visit  Medication Sig Dispense Refill   acetaminophen (TYLENOL) 500 MG tablet Take 500-1,000 mg by mouth every 6 (six) hours as needed for mild pain, moderate pain or headache (or migraines).      albuterol (PROVENTIL) (2.5 MG/3ML) 0.083% nebulizer solution Use 1 vial via nebulizer every 6 hours and as needed (Patient taking differently: Take 2.5 mg by nebulization every 6 (six) hours. Shortness of breath) 150 mL 11   albuterol (VENTOLIN HFA) 108 (90 Base) MCG/ACT inhaler Inhale 2 puffs into the lungs 4 (four) times daily as needed. 18 g 5   amitriptyline (ELAVIL) 25 MG tablet Take 1 tablet (25 mg total) by mouth at bedtime as needed. (Patient taking differently: Take 25 mg by mouth at bedtime as needed for sleep.) 30 tablet 2   calcium carbonate (TUMS - DOSED IN MG ELEMENTAL CALCIUM) 500 MG chewable tablet Chew 1-3 tablets by mouth daily as needed for indigestion or heartburn.     cetirizine (ZYRTEC) 10 MG tablet Take 1 tablet (10 mg total) by mouth every evening as needed for allergies. 30 tablet 5   diphenhydrAMINE (BENADRYL) 25 mg capsule Take 25-50 mg by mouth 2 (two) times daily as needed for allergies.  ferrous sulfate 324 (65 Fe) MG TBEC 1 pill daily after meal to help with anemia 30 tablet 3   fluticasone (FLONASE ALLERGY RELIEF) 50 MCG/ACT nasal spray Place 2 sprays into both nostrils daily. 16 g 5   fluticasone-salmeterol (ADVAIR DISKUS) 500-50 MCG/ACT AEPB Inhale 1 puff into the lungs in the morning and at bedtime. 60 each 11   gabapentin (NEURONTIN) 100 MG capsule Take 1 capsule (100 mg total) by mouth 3 (three) times daily. 90 capsule 2   hydrocortisone 2.5 % cream Apply 2 times daily as needed for bumps 30 g 2   ibuprofen (ADVIL) 800 MG tablet Take 1 tablet (800 mg total) by mouth every 6 (six) hours  as needed for pain 21 tablet 0   montelukast (SINGULAIR) 10 MG tablet Take 1 tablet (10 mg total) by mouth daily. 30 tablet 5   omeprazole (PRILOSEC) 40 MG capsule Take 1 capsule (40 mg total) by mouth daily. 30 capsule 11   oxyCODONE-acetaminophen (PERCOCET/ROXICET) 5-325 MG tablet Take 1 tablet by mouth 2 (two) times daily as needed for 10 days 20 tablet 0   phentermine (ADIPEX-P) 37.5 MG tablet Take 1 tablet (37.5 mg total) by mouth in the morning. 30 tablet 2   SUMAtriptan (IMITREX) 100 MG tablet Take 1 tablet (100 mg total) by mouth as needed at onset of migraine symptoms. May repeat in 1-2 hours if persistent. 15 tablet 5   tiZANidine (ZANAFLEX) 4 MG tablet Take 1 tablet (4 mg total) by mouth at bedtime. 30 tablet 0   topiramate (TOPAMAX) 100 MG tablet Take 1 tablet (100 mg total) by mouth daily. 30 tablet 5   triamcinolone cream (KENALOG) 0.1 % Apply 1 Application topically 2 (two) times daily as needed. 454 g 2   Vitamin D, Ergocalciferol, (DRISDOL) 1.25 MG (50000 UNIT) CAPS capsule Take 1 capsule (50,000 Units total) by mouth once a week. 4 capsule 5   predniSONE (DELTASONE) 50 MG tablet Take 1 tablet (50 mg total) by mouth daily with breakfast. 5 tablet 0   albuterol (VENTOLIN HFA) 108 (90 Base) MCG/ACT inhaler Inhale 2 puffs into the lungs 4 (four) times daily as needed. (Patient not taking: Reported on 04/23/2022) 18 g 5   amitriptyline (ELAVIL) 25 MG tablet Take 1 tablet (25 mg total) by mouth at bedtime as needed. (Patient not taking: Reported on 04/23/2022) 30 tablet 5   Chlorphen-PE-Acetaminophen (NOREL AD) 4-10-325 MG TABS Take 1 tablet by mouth 2 (two) times daily as needed for cough and congestion (Patient not taking: Reported on 04/23/2022) 20 tablet 2   molnupiravir EUA (LAGEVRIO) 200 MG CAPS capsule Take 4 capsules (800 mg total) by mouth 2 (two) times daily for 5 days. (Patient not taking: Reported on 04/23/2022) 40 capsule 0   oxyCODONE-acetaminophen (PERCOCET/ROXICET) 5-325 MG  tablet Take 1 tablet by mouth 2 (two) times daily as needed for 10 days (Patient not taking: Reported on 04/23/2022) 20 tablet 0   phentermine (ADIPEX-P) 37.5 MG tablet Take 1 tablet (37.5 mg total) by mouth in the morning. (Patient not taking: Reported on 04/23/2022) 30 tablet 2   amoxicillin (AMOXIL) 500 MG capsule Take 1 capsule (500 mg total) by mouth every 8 (eight) hours until gone (Patient not taking: Reported on 04/23/2022) 21 capsule 0   doxycycline (VIBRA-TABS) 100 MG tablet Take 1 tablet (100 mg total) by mouth every 12 (twelve) hours. (Patient not taking: Reported on 12/24/2021) 10 tablet 0   No facility-administered medications prior to visit.  Review of Systems  Review of Systems  Constitutional: Negative.   HENT:  Positive for congestion.   Respiratory:  Positive for cough and wheezing.   Cardiovascular: Negative.      Physical Exam  BP 112/68 (BP Location: Right Arm, Patient Position: Sitting, Cuff Size: Large)   Pulse (!) 102   Temp 98.3 F (36.8 C) (Oral)   Ht 5\' 2"  (1.575 m)   Wt 286 lb (129.7 kg)   LMP 03/30/2022 (Approximate)   SpO2 100%   BMI 52.31 kg/m  Physical Exam Constitutional:      Appearance: Normal appearance.  HENT:     Head: Normocephalic and atraumatic.     Nose: Congestion present.     Mouth/Throat:     Mouth: Mucous membranes are moist.     Pharynx: Oropharynx is clear.  Cardiovascular:     Rate and Rhythm: Normal rate and regular rhythm.  Pulmonary:     Effort: Pulmonary effort is normal.     Breath sounds: Normal breath sounds. No wheezing.  Skin:    General: Skin is warm and dry.  Neurological:     General: No focal deficit present.     Mental Status: She is alert and oriented to person, place, and time. Mental status is at baseline.  Psychiatric:        Mood and Affect: Mood normal.        Behavior: Behavior normal.        Thought Content: Thought content normal.        Judgment: Judgment normal.      Lab  Results:  CBC    Component Value Date/Time   WBC 12.5 (H) 04/11/2022 2059   RBC 4.42 04/11/2022 2059   HGB 11.7 (L) 04/11/2022 2059   HGB 11.6 06/13/2019 1420   HCT 36.3 04/11/2022 2059   HCT 36.3 06/13/2019 1420   PLT 348 04/11/2022 2059   PLT 354 06/13/2019 1420   MCV 82.1 04/11/2022 2059   MCV 80 06/13/2019 1420   MCH 26.5 04/11/2022 2059   MCHC 32.2 04/11/2022 2059   RDW 14.4 04/11/2022 2059   RDW 13.9 06/13/2019 1420   LYMPHSABS 6.3 (H) 04/11/2022 2059   LYMPHSABS 3.0 09/14/2017 1028   MONOABS 0.4 04/11/2022 2059   EOSABS 0.3 04/11/2022 2059   EOSABS 0.4 09/14/2017 1028   BASOSABS 0.1 04/11/2022 2059   BASOSABS 0.0 09/14/2017 1028    BMET    Component Value Date/Time   NA 138 04/11/2022 2059   NA 137 06/13/2019 1420   K 3.7 04/11/2022 2059   CL 102 04/11/2022 2059   CO2 25 04/11/2022 2059   GLUCOSE 90 04/11/2022 2059   BUN 9 04/11/2022 2059   BUN 10 06/13/2019 1420   CREATININE 0.78 04/11/2022 2059   CREATININE 0.65 01/11/2014 1125   CALCIUM 8.8 (L) 04/11/2022 2059   GFRNONAA >60 04/11/2022 2059   GFRAA >60 12/11/2019 1157    BNP No results found for: "BNP"  ProBNP No results found for: "PROBNP"  Imaging: CT Angio Chest PE W and/or Wo Contrast  Result Date: 04/12/2022 CLINICAL DATA:  A 36 year old female presents for evaluation of suspected pulmonary embolism. EXAM: CT ANGIOGRAPHY CHEST WITH CONTRAST TECHNIQUE: Multidetector CT imaging of the chest was performed using the standard protocol during bolus administration of intravenous contrast. Multiplanar CT image reconstructions and MIPs were obtained to evaluate the vascular anatomy. RADIATION DOSE REDUCTION: This exam was performed according to the departmental dose-optimization program which includes automated exposure control,  adjustment of the mA and/or kV according to patient size and/or use of iterative reconstruction technique. CONTRAST:  7mL OMNIPAQUE IOHEXOL 350 MG/ML SOLN COMPARISON:  November 24, 2021. FINDINGS: Cardiovascular: Normal caliber of the thoracic aorta. No adjacent stranding. Limited assessment of the aorta due to bolus timing optimized for evaluation of pulmonary arterial structures. Main pulmonary artery is opacified to 357 Hounsfield units. These study is negative for pulmonary embolism. Mediastinum/Nodes: No signs of adenopathy in the chest. Lungs/Pleura: 5 mm RIGHT upper lobe pulmonary nodule (image 53/6) not changed since June of 2021 and compatible with benign pulmonary nodule for which no additional dedicated follow-up imaging is recommended. No pneumothorax. No pleural effusion. No consolidative changes. Airways are patent. Upper Abdomen: Incidental imaging of the upper abdomen is unremarkable to the extent evaluated. Imaged portions the liver, gallbladder, pancreas, spleen, adrenal glands and kidneys without acute findings. Musculoskeletal: No acute or destructive bone process. No chest wall mass. Review of the MIP images confirms the above findings. IMPRESSION: 1. These study is negative for pulmonary embolism. 2. No acute cardiopulmonary findings. Electronically Signed   By: Zetta Bills M.D.   On: 04/12/2022 12:12   DG Chest Portable 1 View  Result Date: 04/12/2022 CLINICAL DATA:  SOB EXAM: PORTABLE CHEST 1 VIEW COMPARISON:  11/24/2021. FINDINGS: The heart size and mediastinal contours are within normal limits. Both lungs are clear. No pneumothorax or pleural effusion. The visualized skeletal structures are unremarkable. IMPRESSION: No active disease. Electronically Signed   By: Sammie Bench M.D.   On: 04/12/2022 08:37   CT Head Wo Contrast  Result Date: 04/12/2022 CLINICAL DATA:  Headache, increasing frequency or severity EXAM: CT HEAD WITHOUT CONTRAST TECHNIQUE: Contiguous axial images were obtained from the base of the skull through the vertex without intravenous contrast. RADIATION DOSE REDUCTION: This exam was performed according to the departmental  dose-optimization program which includes automated exposure control, adjustment of the mA and/or kV according to patient size and/or use of iterative reconstruction technique. COMPARISON:  None Available. FINDINGS: Brain: No evidence of acute infarction, hemorrhage, hydrocephalus, extra-axial collection or mass lesion/mass effect. Note is made of a partially empty sella. Vascular: No hyperdense vessel or unexpected calcification. Skull: Normal. Negative for fracture or focal lesion. Sinuses/Orbits: No acute finding. IMPRESSION: Partially empty sella, a stable finding. Otherwise no acute intracranial process. Electronically Signed   By: Sammie Bench M.D.   On: 04/12/2022 08:25     Assessment & Plan:   Asthmatic bronchitis with acute exacerbation Recommendations: - Continue Advair 588mcg - one puff twice daily - Continue Singulair, zyrtec and flonase as directed  - Start saline nasal irrigation - use once daily for nasal congestion  Rx: - Augmentin and prednisone taper   Orders: - FENO  Referral: - Allergy re: allergic rhinitis   Follow-up: - 6 months with Dr. Shearon Stalls or sooner if needed      Martyn Ehrich, NP 04/26/2022

## 2022-04-26 DIAGNOSIS — J45901 Unspecified asthma with (acute) exacerbation: Secondary | ICD-10-CM | POA: Insufficient documentation

## 2022-04-26 NOTE — Assessment & Plan Note (Signed)
Recommendations: - Continue Advair - one puff twice daily - Continue Singulair, zyrtec and flonase as directed  - Start saline nasal irrigation - use once daily for nasal congestion  Rx: - Augmentin and prednisone taper   Orders: - FENO  Referral: - Allergy re: allergic rhinitis   Follow-up: - 6 months with Dr. Celine Mans or sooner if needed

## 2022-04-30 ENCOUNTER — Other Ambulatory Visit (HOSPITAL_COMMUNITY): Payer: Self-pay

## 2022-04-30 MED ORDER — OXYCODONE-ACETAMINOPHEN 5-325 MG PO TABS
1.0000 | ORAL_TABLET | Freq: Two times a day (BID) | ORAL | 0 refills | Status: DC | PRN
  Filled 2022-05-03: qty 20, 10d supply, fill #0

## 2022-05-03 ENCOUNTER — Other Ambulatory Visit (HOSPITAL_COMMUNITY): Payer: Self-pay

## 2022-05-03 ENCOUNTER — Other Ambulatory Visit: Payer: Self-pay

## 2022-05-06 ENCOUNTER — Other Ambulatory Visit (HOSPITAL_COMMUNITY): Payer: Self-pay

## 2022-05-18 ENCOUNTER — Other Ambulatory Visit (HOSPITAL_COMMUNITY): Payer: Self-pay

## 2022-05-18 MED ORDER — OXYCODONE-ACETAMINOPHEN 5-325 MG PO TABS
1.0000 | ORAL_TABLET | Freq: Two times a day (BID) | ORAL | 0 refills | Status: DC | PRN
  Filled 2022-05-18: qty 20, 10d supply, fill #0

## 2022-06-01 ENCOUNTER — Other Ambulatory Visit (HOSPITAL_COMMUNITY): Payer: Self-pay

## 2022-06-01 MED ORDER — OXYCODONE-ACETAMINOPHEN 5-325 MG PO TABS
1.0000 | ORAL_TABLET | Freq: Two times a day (BID) | ORAL | 0 refills | Status: DC | PRN
  Filled 2022-06-01: qty 20, 10d supply, fill #0

## 2022-06-06 ENCOUNTER — Ambulatory Visit (HOSPITAL_COMMUNITY)
Admission: EM | Admit: 2022-06-06 | Discharge: 2022-06-06 | Disposition: A | Discharge status: Home / Self Care | Payer: Medicaid Other | Attending: Family Medicine | Admitting: Family Medicine

## 2022-06-06 ENCOUNTER — Encounter (HOSPITAL_COMMUNITY): Payer: Self-pay

## 2022-06-06 DIAGNOSIS — R0602 Shortness of breath: Secondary | ICD-10-CM

## 2022-06-06 DIAGNOSIS — Z1152 Encounter for screening for COVID-19: Secondary | ICD-10-CM

## 2022-06-06 DIAGNOSIS — J069 Acute upper respiratory infection, unspecified: Secondary | ICD-10-CM | POA: Diagnosis present

## 2022-06-06 MED ORDER — PREDNISONE 20 MG PO TABS: 40.0000 mg | ORAL_TABLET | Freq: Every day | ORAL | 0 refills | Status: AC

## 2022-06-06 NOTE — ED Provider Notes (Signed)
[facility]    CSN: [id number] Arrival date & time: 06/06/22  1718      History   Chief Complaint Chief Complaint  Patient presents with   Shortness of Breath   Nausea   Fatigue    HPI Laurie Reed is a 37 y.o. female.   Patient is here for uri symptoms.  Having a cough, congestion, sneezing, runny nose, sob, feeling weak.  Started with symptoms 2 days ago.  No fevers.   No wheezing per se.  She vomited on Friday, but mostly just nausea.  Using otc meds mucinex and theraflu.  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        Past Medical History:  Diagnosis Date   Acid reflux 2004   Anemia    Anxiety 2008   Asthma 1993   no previous intubations or hospitalizations    Carpal tunnel syndrome    Chlamydia    Depression 2008   no meds, currenly ok   Environmental allergies    GERD (gastroesophageal reflux disease)    Migraines    Miscarriage    Ovarian cyst    Pre-diabetes    Ulcer of the stomach and intestine 2004   Urinary tract infection     Patient Active Problem List   Diagnosis Date Noted   Asthmatic bronchitis with acute exacerbation 04/26/2022   Moderate persistent asthma 11/27/2021   GERD (gastroesophageal reflux disease) 11/27/2021   Normocytic anemia 11/27/2021   Acute severe exacerbation of moderate persistent allergic asthma 11/24/2021   Chronic low back pain 12/26/2019   Anemia in pregnancy 01/23/2018   Morbid obesity with BMI of 45.0-49.9, adult ([place]) 10/19/2017   Seasonal allergic rhinitis 11/01/2016   Allergic asthma 06/30/2015   Vitamin D deficiency 01/22/2015   Anxiety and depression 01/21/2015    Past Surgical History:  Procedure Laterality Date   CARPAL TUNNEL RELEASE Left    COLONOSCOPY  2004   IRRIGATION AND DEBRIDEMENT SEBACEOUS CYST N/A 06/01/2017   Procedure: EXCISION SEBACEOUS CYST ON SCALP;  Surgeon: Vickie Epley, MD;  Location: [facility];  Service: General;  Laterality: N/A;   MULTIPLE  TOOTH EXTRACTIONS  2015   6 teeth removed    TUBAL LIGATION N/A 04/04/2018   Procedure: POST PARTUM TUBAL LIGATION;  Surgeon: Truett Mainland, DO;  Location: [facility];  Service: Gynecology;  Laterality: N/A;   WISDOM TOOTH EXTRACTION  2007    OB History     Gravida  7   Para  4   Term  4   Preterm  0   AB  3   Living  4      SAB  3   IAB  0   Ectopic  0   Multiple  0   Live Births  4            Home Medications    Prior to Admission medications   Medication Sig Start Date End Date Taking? Authorizing Provider  albuterol (PROVENTIL) (2.5 MG/3ML) 0.083% nebulizer solution Use 1 vial via nebulizer every 6 hours and as needed Patient taking differently: Take 2.5 mg by nebulization every 6 (six) hours. Shortness of breath 05/15/21  Yes   albuterol (VENTOLIN HFA) 108 (90 Base) MCG/ACT inhaler Inhale 2 puffs into the lungs 4 (four) times daily as needed. 05/15/21  Yes   amitriptyline (ELAVIL) 25 MG tablet Take 1 tablet (25 mg total) by mouth at bedtime as needed. Patient taking  differently: Take 25 mg by mouth at bedtime as needed for sleep. 09/16/21  Yes   ferrous sulfate 324 (65 Fe) MG TBEC 1 pill daily after meal to help with anemia 11/22/18  Yes [name], [name], MD  fluticasone (FLONASE ALLERGY RELIEF) 50 MCG/ACT nasal spray Place 2 sprays into both nostrils daily. 12/09/21  Yes   fluticasone-salmeterol (ADVAIR DISKUS) 500-50 MCG/ACT AEPB Inhale 1 puff into the lungs in the morning and at bedtime. 12/24/21  Yes Spero Geralds, MD  gabapentin (NEURONTIN) 100 MG capsule Take 1 capsule (100 mg total) by mouth 3 (three) times daily. 04/21/22  Yes   montelukast (SINGULAIR) 10 MG tablet Take 1 tablet (10 mg total) by mouth daily. 12/09/21  Yes   omeprazole (PRILOSEC) 40 MG capsule Take 1 capsule (40 mg total) by mouth daily. 12/24/21 12/24/22 Yes Spero Geralds, MD  oxyCODONE-acetaminophen (PERCOCET/ROXICET) 5-325 MG tablet Take 1 tablet by mouth 2 (two) times daily as  needed for 10 days 06/01/22  Yes   phentermine (ADIPEX-P) 37.5 MG tablet Take 1 tablet (37.5 mg total) by mouth in the morning. 10/14/21  Yes   SUMAtriptan (IMITREX) 100 MG tablet Take 1 tablet (100 mg total) by mouth as needed at onset of migraine symptoms. May repeat in 1-2 hours if persistent. 12/09/21  Yes   tiZANidine (ZANAFLEX) 4 MG tablet Take 1 tablet (4 mg total) by mouth at bedtime. 02/22/22  Yes Jaynee Eagles, PA-C  topiramate (TOPAMAX) 100 MG tablet Take 1 tablet (100 mg total) by mouth daily. 12/09/21  Yes   acetaminophen (TYLENOL) 500 MG tablet Take 500-1,000 mg by mouth every 6 (six) hours as needed for mild pain, moderate pain or headache (or migraines).     [provider]  albuterol (VENTOLIN HFA) 108 (90 Base) MCG/ACT inhaler Inhale 2 puffs into the lungs 4 (four) times daily as needed. Patient not taking: Reported on 04/23/2022 12/09/21     amitriptyline (ELAVIL) 25 MG tablet Take 1 tablet (25 mg total) by mouth at bedtime as needed. Patient not taking: Reported on 04/23/2022 12/09/21     amoxicillin-clavulanate (AUGMENTIN) 875-125 MG tablet Take 1 tablet by mouth 2 (two) times daily. 04/23/22   Martyn Ehrich, NP  calcium carbonate (TUMS - DOSED IN MG ELEMENTAL CALCIUM) 500 MG chewable tablet Chew 1-3 tablets by mouth daily as needed for indigestion or heartburn.    [provider]  cetirizine (ZYRTEC) 10 MG tablet Take 1 tablet (10 mg total) by mouth every evening as needed for allergies. 12/09/21     Chlorphen-PE-Acetaminophen (NOREL AD) 4-10-325 MG TABS Take 1 tablet by mouth 2 (two) times daily as needed for cough and congestion Patient not taking: Reported on 04/23/2022 01/25/22     diphenhydrAMINE (BENADRYL) 25 mg capsule Take 25-50 mg by mouth 2 (two) times daily as needed for allergies.    [provider]  hydrocortisone 2.5 % cream Apply 2 times daily as needed for bumps 05/15/21     ibuprofen (ADVIL) 800 MG tablet Take 1 tablet (800 mg total) by mouth  every 6 (six) hours as needed for pain 02/10/22     molnupiravir EUA (LAGEVRIO) 200 MG CAPS capsule Take 4 capsules (800 mg total) by mouth 2 (two) times daily for 5 days. Patient not taking: Reported on 04/23/2022 01/25/22     oxyCODONE-acetaminophen (PERCOCET/ROXICET) 5-325 MG tablet Take 1 tablet by mouth 2 (two) times daily as needed for 10 days 04/12/22     oxyCODONE-acetaminophen (PERCOCET/ROXICET) 5-325 MG  tablet Take 1 tablet by mouth 2 (two) times daily as needed for 10 days Patient not taking: Reported on 04/23/2022 04/23/22     phentermine (ADIPEX-P) 37.5 MG tablet Take 1 tablet (37.5 mg total) by mouth in the morning. Patient not taking: Reported on 04/23/2022 04/21/22     triamcinolone cream (KENALOG) 0.1 % Apply 1 Application topically 2 (two) times daily as needed. 04/05/22     Vitamin D, Ergocalciferol, (DRISDOL) 1.25 MG (50000 UNIT) CAPS capsule Take 1 capsule (50,000 Units total) by mouth once a week. 07/21/21     dicyclomine (BENTYL) 10 MG capsule Take 1 capsule (10 mg total) by mouth 4 (four) times daily -  before meals and at bedtime. For stomach cramping Patient not taking: Reported on 10/25/2019 06/13/19 10/25/19  [name], Hewitt Shorts, MD  sucralfate (CARAFATE) 1 GM/10ML suspension Take 10 mLs (1 g total) by mouth 4 (four) times daily -  with meals and at bedtime. Patient not taking: Reported on 10/25/2019 06/13/19 10/25/19  Cain Saupe, MD    Family History Family History  Problem Relation Age of Onset   Asthma Mother    Heart disease Mother    Hypertension Mother    Clotting disorder Mother    Diabetes Mother    Asthma Maternal Aunt    Hypertension Maternal Aunt    Diabetes Maternal Aunt    Asthma Maternal Uncle    Hypertension Maternal Uncle    Diabetes Maternal Uncle    Heart disease Maternal Grandmother    Diabetes Maternal Grandmother    Hypertension Maternal Grandmother    Heart disease Maternal Grandfather    Diabetes Maternal Grandfather    Hypertension Maternal  Grandfather    Hypertension Paternal Grandmother    Diabetes Maternal Uncle    Heart disease Maternal Uncle    Diabetes Maternal Uncle    Cancer Paternal Aunt    Hypotension Neg Hx    Anesthesia problems Neg Hx    Malignant hyperthermia Neg Hx    Pseudochol deficiency Neg Hx     Social History Social History   Tobacco Use   Smoking status: Never   Smokeless tobacco: Never  Vaping Use   Vaping Use: Never used  Substance Use Topics   Alcohol use: No    Alcohol/week: 0.0 standard drinks of alcohol   Drug use: No     Allergies   Latex, Other, Zofran [ondansetron], and Tree extract   Review of Systems Review of Systems  Constitutional:  Positive for fatigue. Negative for chills and fever.  HENT:  Positive for congestion and rhinorrhea. Negative for sore throat.   Respiratory:  Positive for shortness of breath. Negative for wheezing.   Gastrointestinal:  Positive for nausea.  Musculoskeletal: Negative.   Skin: Negative.   Psychiatric/Behavioral: Negative.       Physical Exam Triage Vital Signs ED Triage Vitals  Enc Vitals Group     BP 06/06/22 1810 114/82     Pulse Rate 06/06/22 1810 91     Resp 06/06/22 1810 16     Temp 06/06/22 1810 97.9 F (36.6 C)     Temp Source 06/06/22 1810 Oral     SpO2 06/06/22 1810 98 %     Weight 06/06/22 1805 276 lb (125.2 kg)     Height --      Head Circumference --      Peak Flow --      Pain Score 06/06/22 1805 0     Pain Loc --  Pain Edu? --      Excl. in GC? --    No data found.  Updated Vital Signs BP 114/82 (BP Location: Right Arm)   Pulse 91   Temp 97.9 F (36.6 C) (Oral)   Resp 16   Wt 125.2 kg   LMP 05/24/2022 (Approximate)   SpO2 98%   BMI 50.48 kg/m   Visual Acuity Right Eye Distance:   Left Eye Distance:   Bilateral Distance:    Right Eye Near:   Left Eye Near:    Bilateral Near:     Physical Exam Constitutional:      General: She is not in acute distress.    Appearance: She is  well-developed. She is not ill-appearing, toxic-appearing or diaphoretic.  HENT:     Nose: Congestion and rhinorrhea present. No nasal tenderness.     Mouth/Throat:     Pharynx: Oropharyngeal exudate present. No pharyngeal swelling or posterior oropharyngeal erythema.  Cardiovascular:     Rate and Rhythm: Normal rate and regular rhythm.  Pulmonary:     Effort: Pulmonary effort is normal.     Breath sounds: Normal breath sounds. No decreased breath sounds, wheezing or rhonchi.  Musculoskeletal:     Cervical back: Normal range of motion.  Lymphadenopathy:     Cervical: No cervical adenopathy.  Skin:    General: Skin is warm.  Neurological:     General: No focal deficit present.     Mental Status: She is alert.      UC Treatments / Results  Labs (all labs ordered are listed, but only abnormal results are displayed) Labs Reviewed - No data to display  EKG   Radiology No results found.  Procedures Procedures (including critical care time)  Medications Ordered in UC Medications - No data to display  Initial Impression / Assessment and Plan / UC Course  I have reviewed the triage vital signs and the nursing notes.  Pertinent labs & imaging results that were available during my care of the patient were reviewed by me and considered in my medical decision making (see chart for details).    Patient was seen today for URI symptoms.  Her exam and oxygen saturation is normal.  She looks good overall.  As a result no chest xray was warranted.  She does not have any wheezing, but feeling sob at times.  As a result I have sent out prednisone to help with symptoms.  Covid testing was done as well.  If positive would treat with molnupiravir.  Advised to return or go to the ER if not improving or worsening.   Final Clinical Impressions(s) / UC Diagnoses   Final diagnoses:  Encounter for screening for COVID-19  Shortness of breath  Acute upper respiratory infection     Discharge  Instructions      You were seen today for upper respiratory symptoms.  Your exam overall was not concerning.  I have swabbed you for covid and this will be resulted by tomorrow.  If positive we will call and notify you to discuss treatment.  I recommend over the counter claritin/zyrtec and flonase to help with sinus symptoms.  I have sent out prednisone to help with your sensation of shortness of breath.  Please continue to use your home medications as well.  If you have worsening cough or shortness of breath despite medications then please go to the ER for further evaluation.     ED Prescriptions     Medication Sig Dispense  Auth. Provider   predniSONE (DELTASONE) 20 MG tablet Take 2 tablets (40 mg total) by mouth daily for 5 days. 10 tablet Jannifer Franklin, MD      PDMP not reviewed this encounter.   Jannifer Franklin, MD 06/06/22 [phone]

## 2022-06-06 NOTE — ED Triage Notes (Signed)
Pt reports she has been coughing SOB nausea fatigue. Sx started Friday morning. She vomited Friday. Mucinex Theraflu. Using inhaler.

## 2022-06-06 NOTE — Discharge Instructions (Signed)
You were seen today for upper respiratory symptoms.  Your exam overall was not concerning.  I have swabbed you for covid and this will be resulted by tomorrow.  If positive we will call and notify you to discuss treatment.  I recommend over the counter claritin/zyrtec and flonase to help with sinus symptoms.  I have sent out prednisone to help with your sensation of shortness of breath.  Please continue to use your home medications as well.  If you have worsening cough or shortness of breath despite medications then please go to the ER for further evaluation.

## 2022-06-07 LAB — SARS CORONAVIRUS 2 (TAT 6-24 HRS): SARS Coronavirus 2: NEGATIVE

## 2022-06-10 ENCOUNTER — Other Ambulatory Visit (HOSPITAL_COMMUNITY): Payer: Self-pay

## 2022-06-10 MED ORDER — MELOXICAM 15 MG PO TABS
15.0000 mg | ORAL_TABLET | Freq: Every day | ORAL | 0 refills | Status: DC
  Filled 2022-06-10 – 2022-06-16 (×2): qty 30, 30d supply, fill #0

## 2022-06-11 ENCOUNTER — Other Ambulatory Visit: Payer: Self-pay

## 2022-06-11 ENCOUNTER — Other Ambulatory Visit (HOSPITAL_COMMUNITY): Payer: Self-pay

## 2022-06-14 ENCOUNTER — Other Ambulatory Visit (HOSPITAL_COMMUNITY): Payer: Self-pay

## 2022-06-15 ENCOUNTER — Other Ambulatory Visit: Payer: Self-pay

## 2022-06-16 ENCOUNTER — Other Ambulatory Visit: Payer: Self-pay

## 2022-06-16 ENCOUNTER — Encounter: Payer: Self-pay | Admitting: Allergy

## 2022-06-16 ENCOUNTER — Ambulatory Visit (INDEPENDENT_AMBULATORY_CARE_PROVIDER_SITE_OTHER): Payer: Medicaid Other | Admitting: Allergy

## 2022-06-16 ENCOUNTER — Other Ambulatory Visit (HOSPITAL_COMMUNITY): Payer: Self-pay

## 2022-06-16 VITALS — BP 108/62 | HR 93 | Temp 98.6°F | Resp 18 | Ht 62.0 in | Wt 281.7 lb

## 2022-06-16 DIAGNOSIS — J069 Acute upper respiratory infection, unspecified: Secondary | ICD-10-CM | POA: Diagnosis not present

## 2022-06-16 DIAGNOSIS — R0981 Nasal congestion: Secondary | ICD-10-CM

## 2022-06-16 DIAGNOSIS — J4540 Moderate persistent asthma, uncomplicated: Secondary | ICD-10-CM

## 2022-06-16 DIAGNOSIS — H1013 Acute atopic conjunctivitis, bilateral: Secondary | ICD-10-CM | POA: Diagnosis not present

## 2022-06-16 DIAGNOSIS — L2089 Other atopic dermatitis: Secondary | ICD-10-CM

## 2022-06-16 DIAGNOSIS — J3089 Other allergic rhinitis: Secondary | ICD-10-CM

## 2022-06-16 MED ORDER — CROMOLYN SODIUM 4 % OP SOLN
1.0000 [drp] | Freq: Four times a day (QID) | OPHTHALMIC | 4 refills | Status: DC | PRN
  Filled 2022-06-16: qty 10, 19d supply, fill #0

## 2022-06-16 MED ORDER — LEVOCETIRIZINE DIHYDROCHLORIDE 5 MG PO TABS
5.0000 mg | ORAL_TABLET | Freq: Every evening | ORAL | 4 refills | Status: AC
  Filled 2022-06-16 – 2022-09-17 (×2): qty 30, 30d supply, fill #0

## 2022-06-16 MED ORDER — VENTOLIN HFA 108 (90 BASE) MCG/ACT IN AERS
2.0000 | INHALATION_SPRAY | RESPIRATORY_TRACT | 1 refills | Status: DC | PRN
  Filled 2022-06-16: qty 18, 30d supply, fill #0

## 2022-06-16 MED ORDER — PREDNISONE 10 MG PO TABS
ORAL_TABLET | ORAL | 0 refills | Status: AC
  Filled 2022-06-16: qty 26, 7d supply, fill #0

## 2022-06-16 MED ORDER — MONTELUKAST SODIUM 10 MG PO TABS
10.0000 mg | ORAL_TABLET | Freq: Every day | ORAL | 4 refills | Status: DC
  Filled 2022-06-16 – 2022-09-17 (×2): qty 30, 30d supply, fill #0

## 2022-06-16 MED ORDER — RYALTRIS 665-25 MCG/ACT NA SUSP: NASAL | 4 refills | Status: AC

## 2022-06-16 NOTE — Progress Notes (Signed)
New Patient Note  RE: Laurie Reed MRN: [id number] DOB: Nov 15, 1985 Date of Office Visit: 06/16/2022  Primary care provider: Fleet Contras, MD  Chief Complaint: allergies  History of present illness: Laurie Reed is a 37 y.o. female presenting today for evaluation of allergic rhinitis.    She reports runny nose, congestion, throat clearing, throat itching, sneezing, itchy eyes, itchy skin. Symptoms are year-round.  She uses flonase daily and does not feel it helps. Takes zyrtec daily and sometimes helps.  Singular she takes daily. Has not used eye drops for itchy eyes.   Currently she states she has more congestion, sinus pressure in cheeks and forehead.  Other household members are also sick.  No fever, no body aches, chills or night sweats.  She tested negative for covid last week at [facility] and they did prescribe 5 days of prednisone (she states at that time she wasn't as congested as she is now).  She has been drinking theraflu at bedtime and taken mucinex.    She sees pulmonology (starting seeing last year) for asthma.  She states she was in ICU last year (June 2023) for severe asthma exacerbation with respiratory failure requiring bipap.   She has albuterol inhaler and nebs and will use albuterol twice a day when she is sick which is right now.  She states when she is not sick she has not needed to use albuterol.   She uses advair discus 1 puff twice a day.   She has history of eczema as an adult.  She uses triamcinolone that she uses everyday and states if she is out and can't use daily she does itch more.    She is concerned for food allergy.  She states sometimes when she eats she starts scratching her throat due to itch however she has not identified any specific foods that cause this.  She is not avoiding in foods.    She states she did do a course of allergy shots in the [place] [facility].  She states she stopped when she became pregnant as was told she could not continue in  pregnancy.  This was at least 10 years ago.  She states she was on allergy shots for at least 1-3 years.  She did note improvement in symptoms on the injections.  She states she was allergic to a variety of trees that she recalls.   Review of systems: Review of Systems  Constitutional: Negative.   HENT:  Positive for congestion, rhinorrhea, sinus pressure and sinus pain.   Eyes: Negative.   Respiratory:  Positive for cough.   Cardiovascular: Negative.   Gastrointestinal: Negative.   Musculoskeletal: Negative.   Skin: Negative.   Allergic/Immunologic: Negative.   Neurological: Negative.     All other systems negative unless noted above in HPI  Past medical history: Past Medical History:  Diagnosis Date   Acid reflux 2004   Anemia    Anxiety 2008   Asthma 1993   no previous intubations or hospitalizations    Carpal tunnel syndrome    Chlamydia    Depression 2008   no meds, currenly ok   Eczema    Environmental allergies    GERD (gastroesophageal reflux disease)    Migraines    Miscarriage    Ovarian cyst    Pre-diabetes    Ulcer of the stomach and intestine 2004   Urinary tract infection     Past surgical history: Past Surgical History:  Procedure Laterality Date   CARPAL TUNNEL  RELEASE Left    COLONOSCOPY  2004   IRRIGATION AND DEBRIDEMENT SEBACEOUS CYST N/A 06/01/2017   Procedure: EXCISION SEBACEOUS CYST ON SCALP;  Surgeon: Vickie Epley, MD;  Location: [facility];  Service: General;  Laterality: N/A;   MULTIPLE TOOTH EXTRACTIONS  2015   6 teeth removed    TUBAL LIGATION N/A 04/04/2018   Procedure: POST PARTUM TUBAL LIGATION;  Surgeon: Truett Mainland, DO;  Location: [facility];  Service: Gynecology;  Laterality: N/A;   TYMPANOSTOMY TUBE PLACEMENT     WISDOM TOOTH EXTRACTION  2007    Family history:  Family History  Problem Relation Age of Onset   Asthma Mother    Heart disease Mother    Hypertension Mother    Clotting disorder Mother     Diabetes Mother    Asthma Maternal Aunt    Hypertension Maternal Aunt    Diabetes Maternal Aunt    Asthma Maternal Uncle    Hypertension Maternal Uncle    Diabetes Maternal Uncle    Heart disease Maternal Grandmother    Diabetes Maternal Grandmother    Hypertension Maternal Grandmother    Heart disease Maternal Grandfather    Diabetes Maternal Grandfather    Hypertension Maternal Grandfather    Hypertension Paternal Grandmother    Diabetes Maternal Uncle    Heart disease Maternal Uncle    Diabetes Maternal Uncle    Cancer Paternal Aunt    Hypotension Neg Hx    Anesthesia problems Neg Hx    Malignant hyperthermia Neg Hx    Pseudochol deficiency Neg Hx     Social history: Lives in a apartment with gas heating and central cooling.  No pets in the home. No concern for water damage, mildew or roaches in the home.  She is a direct Actuary and job requires cooking, cleaning, bathing clients, passing medications, taking clients on outings, taking them to day programs throughout the week.  Denies smoking history.    Medication List: Current Outpatient Medications  Medication Sig Dispense Refill   acetaminophen (TYLENOL) 500 MG tablet Take 500-1,000 mg by mouth every 6 (six) hours as needed for mild pain, moderate pain or headache (or migraines).      albuterol (PROVENTIL) (2.5 MG/3ML) 0.083% nebulizer solution Use 1 vial via nebulizer every 6 hours and as needed (Patient taking differently: Take 2.5 mg by nebulization every 6 (six) hours. Shortness of breath) 150 mL 11   albuterol (VENTOLIN HFA) 108 (90 Base) MCG/ACT inhaler Inhale 2 puffs into the lungs 4 (four) times daily as needed. 18 g 5   albuterol (VENTOLIN HFA) 108 (90 Base) MCG/ACT inhaler Inhale 2 puffs into the lungs 4 (four) times daily as needed. 18 g 5   amitriptyline (ELAVIL) 25 MG tablet Take 1 tablet (25 mg total) by mouth at bedtime as needed. (Patient taking differently: Take 25 mg by mouth at bedtime as  needed for sleep.) 30 tablet 2   amitriptyline (ELAVIL) 25 MG tablet Take 1 tablet (25 mg total) by mouth at bedtime as needed. 30 tablet 5   calcium carbonate (TUMS - DOSED IN MG ELEMENTAL CALCIUM) 500 MG chewable tablet Chew 1-3 tablets by mouth daily as needed for indigestion or heartburn.     Chlorphen-PE-Acetaminophen (NOREL AD) 4-10-325 MG TABS Take 1 tablet by mouth 2 (two) times daily as needed for cough and congestion 20 tablet 2   cromolyn (OPTICROM) 4 % ophthalmic solution 1 drop per eye up to 4 times daily as needed  for itchy/watery eyes. 10 mL 4   diphenhydrAMINE (BENADRYL) 25 mg capsule Take 25-50 mg by mouth 2 (two) times daily as needed for allergies.     ferrous sulfate 324 (65 Fe) MG TBEC 1 pill daily after meal to help with anemia 30 tablet 3   fluticasone (FLONASE ALLERGY RELIEF) 50 MCG/ACT nasal spray Place 2 sprays into both nostrils daily. 16 g 5   fluticasone-salmeterol (ADVAIR DISKUS) 500-50 MCG/ACT AEPB Inhale 1 puff into the lungs in the morning and at bedtime. 60 each 11   gabapentin (NEURONTIN) 100 MG capsule Take 1 capsule (100 mg total) by mouth 3 (three) times daily. 90 capsule 2   hydrocortisone 2.5 % cream Apply 2 times daily as needed for bumps 30 g 2   ibuprofen (ADVIL) 800 MG tablet Take 1 tablet (800 mg total) by mouth every 6 (six) hours as needed for pain 21 tablet 0   levocetirizine (XYZAL) 5 MG tablet Take 1 tablet (5 mg total) by mouth every evening. 30 tablet 4   meloxicam (MOBIC) 15 MG tablet Take 1 tablet (15 mg total) by mouth daily with meal 30 tablet 0   montelukast (SINGULAIR) 10 MG tablet Take 1 tablet (10 mg total) by mouth daily. 30 tablet 5   montelukast (SINGULAIR) 10 MG tablet Take 1 tablet (10 mg total) by mouth at bedtime. 30 tablet 4   omeprazole (PRILOSEC) 40 MG capsule Take 1 capsule (40 mg total) by mouth daily. 30 capsule 11   oxyCODONE-acetaminophen (PERCOCET/ROXICET) 5-325 MG tablet Take 1 tablet by mouth 2 (two) times daily as needed  for 10 days 20 tablet 0   oxyCODONE-acetaminophen (PERCOCET/ROXICET) 5-325 MG tablet Take 1 tablet by mouth 2 (two) times daily as needed for 10 days 20 tablet 0   oxyCODONE-acetaminophen (PERCOCET/ROXICET) 5-325 MG tablet Take 1 tablet by mouth 2 (two) times daily as needed for 10 days 20 tablet 0   phentermine (ADIPEX-P) 37.5 MG tablet Take 1 tablet (37.5 mg total) by mouth in the morning. 30 tablet 2   phentermine (ADIPEX-P) 37.5 MG tablet Take 1 tablet (37.5 mg total) by mouth in the morning. 30 tablet 2   predniSONE (DELTASONE) 10 MG tablet 30mg  twice a day for 2 days, then 20mg  twice a day for 2 days then 20mg  once a day for 2 days then 10mg  once a day for 1 day and stop 26 tablet 0   RYALTRIS 665-25 MCG/ACT SUSP 2 sprays per nostril 1-2 times daily. 29 g 4   SUMAtriptan (IMITREX) 100 MG tablet Take 1 tablet (100 mg total) by mouth as needed at onset of migraine symptoms. May repeat in 1-2 hours if persistent. 15 tablet 5   tiZANidine (ZANAFLEX) 4 MG tablet Take 1 tablet (4 mg total) by mouth at bedtime. 30 tablet 0   topiramate (TOPAMAX) 100 MG tablet Take 1 tablet (100 mg total) by mouth daily. 30 tablet 5   triamcinolone cream (KENALOG) 0.1 % Apply 1 Application topically 2 (two) times daily as needed. 454 g 2   VENTOLIN HFA 108 (90 Base) MCG/ACT inhaler Inhale 2 puffs into the lungs every 4 (four) hours as needed for wheezing or shortness of breath. 18 g 1   Vitamin D, Ergocalciferol, (DRISDOL) 1.25 MG (50000 UNIT) CAPS capsule Take 1 capsule (50,000 Units total) by mouth once a week. 4 capsule 5   amoxicillin-clavulanate (AUGMENTIN) 875-125 MG tablet Take 1 tablet by mouth 2 (two) times daily. (Patient not taking: Reported on 06/16/2022) 14  tablet 0   molnupiravir EUA (LAGEVRIO) 200 MG CAPS capsule Take 4 capsules (800 mg total) by mouth 2 (two) times daily for 5 days. (Patient not taking: Reported on 04/23/2022) 40 capsule 0   No current facility-administered medications for this visit.     Known medication allergies: Allergies  Allergen Reactions   Latex Hives and Itching   Other Itching    "20 different types of trees"   Zofran [Ondansetron] Itching   Tree Extract Itching     Physical examination: Blood pressure 108/62, pulse 93, temperature 98.6 F (37 C), temperature source Temporal, resp. rate 18, height 5\' 2"  (1.575 m), weight 281 lb 11.2 oz (127.8 kg), last menstrual period 05/24/2022, SpO2 99 %.  General: Alert, interactive, in no acute distress. HEENT: PERRLA, TMs pearly gray, turbinates markedly edematous with thick discharge, post-pharynx non erythematous. Neck: Supple without lymphadenopathy. Lungs: Mildly decreased breath sounds bilaterally without wheezing, rhonchi or rales. {no increased work of breathing. CV: Normal S1, S2 without murmurs. Abdomen: Nondistended, nontender. Skin: Warm and dry, without lesions or rashes. Extremities:  No clubbing, cyanosis or edema. Neuro:   Grossly intact.  Diagnositics/Labs: Labs:  Component     Latest Ref Rng 12/24/2021  ORCHARD GRASS (COCKSFOOT) (G3) IGE     kU/L 23.30 (H)   Class 3   Class 3   Class 4   Class 4   Class 4   G005 [place], [place]     kU/L 24.50 (H)   [place] Grass     kU/L 16.70 (H)   G009 [place]     kU/L 22.90 (H)   [place] Grass     kU/L 8.47 (H)     Component     Latest Ref Rng 04/11/2022  WBC     4.0 - 10.5 K/uL 12.5 (H)   RBC     3.87 - 5.11 MIL/uL 4.42   Hemoglobin     12.0 - 15.0 g/dL 04/13/2022 (L)   HCT     45.4 - 46.0 % 36.3   MCV     80.0 - 100.0 fL 82.1   MCH     26.0 - 34.0 pg 26.5   MCHC     30.0 - 36.0 g/dL 09.8   RDW     11.9 - 14.7 % 14.4   Platelets     150 - 400 K/uL 348   nRBC     0.0 - 0.2 % 0.0   nRBC     0 /100 WBC 0   Neutrophils     % 44   NEUT#     1.7 - 7.7 K/uL 5.5   Lymphocytes     % 50   Lymphocyte #     0.7 - 4.0 K/uL 6.3 (H)   Monocytes Relative     % 3   Monocyte #     0.1 - 1.0 K/uL 0.4   Eosinophil     % 2   Eosinophils  Absolute     0.0 - 0.5 K/uL 0.3   Basophil     % 1   Basophils Absolute     0.0 - 0.1 K/uL 0.1   Abs Immature Granulocytes     0.00 - 0.07 K/uL 0.00     Spirometry: FEV1: 2.06L 84%, FVC: 2.58L 89%, ratio consistent with nonobstructive pattern  Allergy testing: deferred due to URI   Assessment and plan: Viral Upper respiratory illness Severe nasal congestion - You currently have a upper respiratory  illness that is impacting your sinuses and lungs.  You have severe nasal congestion at this time - Recommend another 5 days burst of prednisone to help with congestion as well as breathing control.  Take prednisone 30mg  twice a day for 2 days, then 20mg  twice a day for 2 days then 20mg  once a day for 2 days then 10mg  once a day for 1 day and stop.   - Can continue Mucinex as needed and Theraflu for symptom control - Get adequate rest and stay well hydrated  Allergic rhinitis with conjunctivitis Throat itch with eating - Abbreviated allergy panel done in August only tested for grass pollen which was positive - Will obtain full environmental allergy panel and basic food panel.   It is most likely that throat itch is due to oral allergy syndrome and not a direct food allergy - Stop taking: Zyrtec and Flonase as not very effective at this time - Continue with: Singulair (montelukast) 10mg  daily - Start taking: Xyzal (levocetirizine) 5mg  tablet once daily.   This replaces Zyrtec. Ryaltris (olopatadine/mometasone) two sprays per nostril 1-2 times daily.  This is a combination nasal spray to help both nasal drainage and congestion.  Cromolyn one drop per eye up to 4 times a day as needed for itchy/watery eyes - You can use an extra dose of the antihistamine, if needed, for breakthrough symptoms.  - Continue nasal saline rinses 1-2 times daily to remove allergens from the nasal cavities as well as help with mucous clearance (this is especially helpful to do before the nasal sprays are given) -  Consider allergy shots as a means of long-term control. - Allergy shots "re-train" and "reset" the immune system to ignore environmental allergens and decrease the resulting immune response to those allergens (sneezing, itchy watery eyes, runny nose, nasal congestion, etc).    - Allergy shots improve symptoms in 75-85% of patients.   Asthma, mod persistent - Daily controller medication(s): Singulair 10mg  daily and Advair 500/4mcg one puff twice daily - Prior to physical activity: albuterol 2 puffs 10-15 minutes before physical activity. - Rescue medications: albuterol 2 puffs every 4-6 hours as needed and albuterol nebulizer one vial every 4-6 hours as needed  - Asthma control goals:  * Full participation in all desired activities (may need albuterol before activity) * Albuterol use two time or less a week on average (not counting use with activity) * Cough interfering with sleep two time or less a month * Oral steroids no more than once a year * No hospitalizations  Eczema - Bathe and soak for 5-10 minutes in warm water once a day. Pat dry.  Immediately apply the below cream prescribed to flared areas (red, irritated, dry, itchy, patchy, scaly, flaky) only. Wait several minutes and then apply your moisturizer all over.    To affected areas on the body (below the face and neck), apply: Triamcinolone 0.1 % cream twice a day as needed. With ointments be careful to avoid the armpits and groin area.   Follow-up in 3-4 months or sooner if needed   I appreciate the opportunity to take part in Svea's care. Please do not hesitate to contact me with questions.  Sincerely,   Prudy Feeler, MD Allergy/Immunology [facility]

## 2022-06-16 NOTE — Patient Instructions (Signed)
-  You currently have a upper respiratory illness that is impacting your sinuses and lungs.  You have severe nasal congestion at this time - Recommend another 5 days burst of prednisone to help with congestion as well as breathing control.  Take prednisone 30mg  twice a day for 2 days, then 20mg  twice a day for 2 days then 20mg  once a day for 2 days then 10mg  once a day for 1 day and stop.   - Can continue Mucinex as needed and Theraflu for symptom control - Get adequate rest and stay well hydrated  - Abbreviated allergy panel done in August only tested for grass pollen which was positive - Will obtain full environmental allergy panel - Stop taking: Zyrtec and Flonase as not very effective at this time - Continue with: Singulair (montelukast) 10mg  daily - Start taking: Xyzal (levocetirizine) 5mg  tablet once daily.   This replaces Zyrtec. Ryaltris (olopatadine/mometasone) two sprays per nostril 1-2 times daily.  This is a combination nasal spray to help both nasal drainage and congestion.  Cromolyn one drop per eye up to 4 times a day as needed for itchy/watery eyes - You can use an extra dose of the antihistamine, if needed, for breakthrough symptoms.  - Continue nasal saline rinses 1-2 times daily to remove allergens from the nasal cavities as well as help with mucous clearance (this is especially helpful to do before the nasal sprays are given) - Consider allergy shots as a means of long-term control. - Allergy shots "re-train" and "reset" the immune system to ignore environmental allergens and decrease the resulting immune response to those allergens (sneezing, itchy watery eyes, runny nose, nasal congestion, etc).    - Allergy shots improve symptoms in 75-85% of patients.   - Daily controller medication(s): Singulair 10mg  daily and Advair 500/53mcg one puff twice daily - Prior to physical activity: albuterol 2 puffs 10-15 minutes before physical activity. - Rescue medications: albuterol 2 puffs  every 4-6 hours as needed and albuterol nebulizer one vial every 4-6 hours as needed  - Asthma control goals:  * Full participation in all desired activities (may need albuterol before activity) * Albuterol use two time or less a week on average (not counting use with activity) * Cough interfering with sleep two time or less a month * Oral steroids no more than once a year * No hospitalizations  - Bathe and soak for 5-10 minutes in warm water once a day. Pat dry.  Immediately apply the below cream prescribed to flared areas (red, irritated, dry, itchy, patchy, scaly, flaky) only. Wait several minutes and then apply your moisturizer all over.    To affected areas on the body (below the face and neck), apply: Triamcinolone 0.1 % cream twice a day as needed. With ointments be careful to avoid the armpits and groin area.   Follow-up in 3-4 months or sooner if needed

## 2022-06-19 LAB — ALLERGENS W/TOTAL IGE AREA 2
Alternaria Alternata IgE: <0.1 kU/L
Aspergillus Fumigatus IgE: <0.1 kU/L
Bermuda Grass IgE: 3.51 kU/L — AB
Cat Dander IgE: <0.1 kU/L
Cedar, Mountain IgE: <0.1 kU/L
Cladosporium Herbarum IgE: <0.1 kU/L
Cockroach, German IgE: <0.1 kU/L
Common Silver Birch IgE: <0.1 kU/L
Cottonwood IgE: <0.1 kU/L
D Farinae IgE: <0.1 kU/L
D Pteronyssinus IgE: <0.1 kU/L
Dog Dander IgE: 0.13 kU/L — AB
Elm, American IgE: <0.1 kU/L
IgE (Immunoglobulin E), Serum: 181 IU/mL (ref 6–495)
Johnson Grass IgE: 4.05 kU/L — AB
Maple/Box Elder IgE: <0.1 kU/L
Mouse Urine IgE: <0.1 kU/L
Oak, White IgE: <0.1 kU/L
Pecan, Hickory IgE: <0.1 kU/L
Penicillium Chrysogen IgE: <0.1 kU/L
Pigweed, Rough IgE: <0.1 kU/L
Ragweed, Short IgE: <0.1 kU/L
Sheep Sorrel IgE Qn: <0.1 kU/L
Timothy Grass IgE: 9 kU/L — AB
White Mulberry IgE: <0.1 kU/L

## 2022-06-19 LAB — FOOD ALLERGY PROFILE
Allergen Corn, IgE: <0.1 kU/L
Clam IgE: <0.1 kU/L
Codfish IgE: <0.1 kU/L
Egg White IgE: <0.1 kU/L
Milk IgE: <0.1 kU/L
Peanut IgE: <0.1 kU/L
Scallop IgE: <0.1 kU/L
Sesame Seed IgE: <0.1 kU/L
Shrimp IgE: <0.1 kU/L
Soybean IgE: <0.1 kU/L
Walnut IgE: <0.1 kU/L
Wheat IgE: 0.23 kU/L — AB

## 2022-06-21 ENCOUNTER — Telehealth: Payer: Self-pay

## 2022-06-21 NOTE — Telephone Encounter (Signed)
PA request received via CMM for Ryaltris 665-25MCG/ACT suspension  PA hs been submitted to [employer] and is pending determination.   Key: [id number]

## 2022-06-24 ENCOUNTER — Other Ambulatory Visit (HOSPITAL_COMMUNITY): Payer: Self-pay

## 2022-06-24 MED ORDER — OXYCODONE-ACETAMINOPHEN 5-325 MG PO TABS
1.0000 | ORAL_TABLET | Freq: Two times a day (BID) | ORAL | 0 refills | Status: DC | PRN
  Filled 2022-06-24: qty 20, 10d supply, fill #0

## 2022-06-24 NOTE — Telephone Encounter (Signed)
I called patient to go over lab results. While on the phone patient said that the Ryaltris nasal spray was billed to [facility] but she doesn't have that ins. Patient only has Medicaid. Patient also said that she was not going to pay  $49 for the nasal spray.

## 2022-06-24 NOTE — Telephone Encounter (Signed)
Called patient - NO DPR on file - LMOVM to contact office regarding Ryaltris PA. Patient also advised to check her myChart.  When patient returns call - please advise copies of her current insurance is needed - the system shows her having two insurances: [employer] and Medicaid.

## 2022-06-25 ENCOUNTER — Other Ambulatory Visit (HOSPITAL_COMMUNITY): Payer: Self-pay

## 2022-06-25 NOTE — Telephone Encounter (Signed)
Called patient - No DPR on file - LMOVM regarding notation below.   MyChart message sent as well advising of the above notation.

## 2022-06-25 NOTE — Telephone Encounter (Signed)
Another note: per test claim [employer] (primary) has a [employer] fashion[employer]cl[employer] card that is covering $200.19 towards the cost of the medication at this time, that makes her Medicaid ineligible to cover the remaining cost as an outside source is paying for the medication and not directly [employer].  [employer] has DENIED the PA with no additional information included at this time.

## 2022-06-29 ENCOUNTER — Other Ambulatory Visit (HOSPITAL_COMMUNITY): Payer: Self-pay

## 2022-07-07 ENCOUNTER — Other Ambulatory Visit (HOSPITAL_COMMUNITY): Payer: Self-pay

## 2022-07-12 ENCOUNTER — Other Ambulatory Visit (HOSPITAL_COMMUNITY): Payer: Self-pay

## 2022-07-12 MED ORDER — OXYCODONE-ACETAMINOPHEN 5-325 MG PO TABS
1.0000 | ORAL_TABLET | Freq: Two times a day (BID) | ORAL | 0 refills | Status: DC
  Filled 2022-07-12: qty 20, 10d supply, fill #0

## 2022-07-28 ENCOUNTER — Other Ambulatory Visit (HOSPITAL_COMMUNITY): Payer: Self-pay

## 2022-07-28 MED ORDER — OXYCODONE-ACETAMINOPHEN 5-325 MG PO TABS
1.0000 | ORAL_TABLET | Freq: Two times a day (BID) | ORAL | 0 refills | Status: DC | PRN
  Filled 2022-07-28: qty 20, 10d supply, fill #0

## 2022-08-19 ENCOUNTER — Encounter (HOSPITAL_COMMUNITY): Payer: Self-pay

## 2022-08-19 ENCOUNTER — Ambulatory Visit (HOSPITAL_COMMUNITY)
Admission: RE | Admit: 2022-08-19 | Discharge: 2022-08-19 | Disposition: A | Discharge status: Home / Self Care | Payer: BLUE CROSS/BLUE SHIELD | Source: Ambulatory Visit | Attending: Family Medicine | Admitting: Family Medicine

## 2022-08-19 VITALS — BP 144/95 | HR 94 | Temp 98.4°F | Resp 18

## 2022-08-19 DIAGNOSIS — H6693 Otitis media, unspecified, bilateral: Secondary | ICD-10-CM | POA: Insufficient documentation

## 2022-08-19 DIAGNOSIS — M79642 Pain in left hand: Secondary | ICD-10-CM | POA: Insufficient documentation

## 2022-08-19 DIAGNOSIS — R519 Headache, unspecified: Secondary | ICD-10-CM | POA: Diagnosis present

## 2022-08-19 DIAGNOSIS — J069 Acute upper respiratory infection, unspecified: Secondary | ICD-10-CM | POA: Diagnosis not present

## 2022-08-19 DIAGNOSIS — G8929 Other chronic pain: Secondary | ICD-10-CM | POA: Diagnosis not present

## 2022-08-19 DIAGNOSIS — Z1152 Encounter for screening for COVID-19: Secondary | ICD-10-CM | POA: Diagnosis not present

## 2022-08-19 MED ORDER — KETOROLAC TROMETHAMINE 30 MG/ML IJ SOLN
INTRAMUSCULAR | Status: AC
  Filled 2022-08-19: qty 1

## 2022-08-19 MED ORDER — AMOXICILLIN 875 MG PO TABS: 875.0000 mg | ORAL_TABLET | Freq: Two times a day (BID) | ORAL | 0 refills | Status: AC

## 2022-08-19 MED ORDER — KETOROLAC TROMETHAMINE 30 MG/ML IJ SOLN
30.0000 mg | Freq: Once | INTRAMUSCULAR | Status: AC
  Administered 2022-08-19: 30 mg via INTRAMUSCULAR

## 2022-08-19 NOTE — ED Provider Notes (Signed)
[facility]    CSN: [id number] Arrival date & time: 08/19/22  1801      History   Chief Complaint Chief Complaint  Patient presents with   Headache    Headache, ear aches and also I need my my left hand/arm looked at - Entered by patient   Hand Pain    HPI Laurie Reed is a 37 y.o. female.    Headache Hand Pain Associated symptoms include headaches.   Here for frontal headache and bilateral earache.  About 3 days ago she began having frontal headache and nasal congestion.  Then yesterday her left ear started hurting and her right one is hurting a little bit also.  Not much cough.  No fever or chills and no vomiting or diarrhea she has maybe felt a little nauseated. Last menstrual cycle was March 26.  She also has chronic left hand pain and is seeing Dr. Apolonio Schneiders.  She is waiting to get in with a rheumatologist and states that she has been taking Percocet as needed.  She states the meloxicam, naproxen, and ibuprofen have not helped.  She cannot refill her Percocet until tomorrow.      Past Medical History:  Diagnosis Date   Acid reflux 2004   Anemia    Anxiety 2008   Asthma 1993   no previous intubations or hospitalizations    Carpal tunnel syndrome    Chlamydia    Depression 2008   no meds, currenly ok   Eczema    Environmental allergies    GERD (gastroesophageal reflux disease)    Migraines    Miscarriage    Ovarian cyst    Pre-diabetes    Ulcer of the stomach and intestine 2004   Urinary tract infection     Patient Active Problem List   Diagnosis Date Noted   Asthmatic bronchitis with acute exacerbation 04/26/2022   Moderate persistent asthma 11/27/2021   GERD (gastroesophageal reflux disease) 11/27/2021   Normocytic anemia 11/27/2021   Acute severe exacerbation of moderate persistent allergic asthma 11/24/2021   Extensor tenosynovitis of wrist 04/30/2021   Carpal tunnel syndrome of left wrist 07/18/2020   Carpal tunnel syndrome of right  wrist 07/18/2020   Chronic low back pain 12/26/2019   Anemia in pregnancy 01/23/2018   Morbid obesity with BMI of 45.0-49.9, adult 10/19/2017   Seasonal allergic rhinitis 11/01/2016   Allergic asthma 06/30/2015   Vitamin D deficiency 01/22/2015   Anxiety and depression 01/21/2015    Past Surgical History:  Procedure Laterality Date   CARPAL TUNNEL RELEASE Left    COLONOSCOPY  2004   IRRIGATION AND DEBRIDEMENT SEBACEOUS CYST N/A 06/01/2017   Procedure: EXCISION SEBACEOUS CYST ON SCALP;  Surgeon: Vickie Epley, MD;  Location: [facility];  Service: General;  Laterality: N/A;   MULTIPLE TOOTH EXTRACTIONS  2015   6 teeth removed    TUBAL LIGATION N/A 04/04/2018   Procedure: POST PARTUM TUBAL LIGATION;  Surgeon: Truett Mainland, DO;  Location: [facility];  Service: Gynecology;  Laterality: N/A;   TYMPANOSTOMY TUBE PLACEMENT     WISDOM TOOTH EXTRACTION  2007    OB History     Gravida  7   Para  4   Term  4   Preterm  0   AB  3   Living  4      SAB  3   IAB  0   Ectopic  0   Multiple  0   Live Births  4            Home Medications    Prior to Admission medications   Medication Sig Start Date End Date Taking? Authorizing Provider  amoxicillin (AMOXIL) 875 MG tablet Take 1 tablet (875 mg total) by mouth 2 (two) times daily for 7 days. 08/19/22 08/26/22 Yes Deryn Massengale, Gwenlyn Perking, MD  calcium carbonate (TUMS - DOSED IN MG ELEMENTAL CALCIUM) 500 MG chewable tablet Chew 1-3 tablets by mouth daily as needed for indigestion or heartburn.   Yes [provider]  Chlorphen-PE-Acetaminophen (NOREL AD) 4-10-325 MG TABS Take 1 tablet by mouth 2 (two) times daily as needed for cough and congestion 01/25/22  Yes   cromolyn (OPTICROM) 4 % ophthalmic solution Place 1 drop into both eyes 4 (four) times daily as needed  for itchy/watery eyes. 06/16/22  Yes Padgett, Rae Halsted, MD  diphenhydrAMINE (BENADRYL) 25 mg capsule Take 25-50 mg by mouth 2 (two) times daily  as needed for allergies.   Yes [provider]  ferrous sulfate 324 (65 Fe) MG TBEC 1 pill daily after meal to help with anemia 11/22/18  Yes [name], [name], MD  fluticasone (FLONASE ALLERGY RELIEF) 50 MCG/ACT nasal spray Place 2 sprays into both nostrils daily. 12/09/21  Yes   fluticasone-salmeterol (ADVAIR DISKUS) 500-50 MCG/ACT AEPB Inhale 1 puff into the lungs in the morning and at bedtime. 12/24/21  Yes Spero Geralds, MD  levocetirizine (XYZAL) 5 MG tablet Take 1 tablet (5 mg total) by mouth every evening. 06/16/22  Yes Padgett, Rae Halsted, MD  montelukast (SINGULAIR) 10 MG tablet Take 1 tablet (10 mg total) by mouth daily. 12/09/21  Yes   montelukast (SINGULAIR) 10 MG tablet Take 1 tablet (10 mg total) by mouth at bedtime. 06/16/22  Yes Padgett, Rae Halsted, MD  omeprazole (PRILOSEC) 40 MG capsule Take 1 capsule (40 mg total) by mouth daily. 12/24/21 12/24/22 Yes Spero Geralds, MD  oxyCODONE-acetaminophen (PERCOCET/ROXICET) 5-325 MG tablet Take 1 tablet by mouth 2 (two) times daily as needed for 10 days. 07/27/22  Yes   acetaminophen (TYLENOL) 500 MG tablet Take 500-1,000 mg by mouth every 6 (six) hours as needed for mild pain, moderate pain or headache (or migraines).     [provider]  albuterol (PROVENTIL) (2.5 MG/3ML) 0.083% nebulizer solution Use 1 vial via nebulizer every 6 hours and as needed Patient taking differently: Take 2.5 mg by nebulization every 6 (six) hours. Shortness of breath 05/15/21     albuterol (VENTOLIN HFA) 108 (90 Base) MCG/ACT inhaler Inhale 2 puffs into the lungs 4 (four) times daily as needed. 05/15/21     albuterol (VENTOLIN HFA) 108 (90 Base) MCG/ACT inhaler Inhale 2 puffs into the lungs 4 (four) times daily as needed. 12/09/21     amitriptyline (ELAVIL) 25 MG tablet Take 1 tablet (25 mg total) by mouth at bedtime as needed. Patient taking differently: Take 25 mg by mouth at bedtime as needed for sleep. 09/16/21     amitriptyline (ELAVIL) 25 MG  tablet Take 1 tablet (25 mg total) by mouth at bedtime as needed. 12/09/21     gabapentin (NEURONTIN) 100 MG capsule Take 1 capsule (100 mg total) by mouth 3 (three) times daily. 04/21/22     hydrocortisone 2.5 % cream Apply 2 times daily as needed for bumps 05/15/21     ibuprofen (ADVIL) 800 MG tablet Take 1 tablet (800 mg total) by mouth every 6 (six) hours as needed for pain 02/10/22  meloxicam (MOBIC) 15 MG tablet Take 1 tablet (15 mg total) by mouth daily with meal 06/10/22     oxyCODONE-acetaminophen (PERCOCET/ROXICET) 5-325 MG tablet Take 1 tablet by mouth 2 (two) times daily as needed for 10 days 04/12/22     oxyCODONE-acetaminophen (PERCOCET/ROXICET) 5-325 MG tablet Take 1 tablet by mouth 2 (two) times daily as needed for 10 days 04/23/22     oxyCODONE-acetaminophen (PERCOCET/ROXICET) 5-325 MG tablet Take 1 tablet by mouth 2 (two) times daily as needed for 10 days 06/23/22     oxyCODONE-acetaminophen (PERCOCET/ROXICET) 5-325 MG tablet Take 1 tablet by mouth 2 (two) times daily as needed for 10 days. 07/11/22     phentermine (ADIPEX-P) 37.5 MG tablet Take 1 tablet (37.5 mg total) by mouth in the morning. 10/14/21     phentermine (ADIPEX-P) 37.5 MG tablet Take 1 tablet (37.5 mg total) by mouth in the morning. 04/21/22     RYALTRIS 665-25 MCG/ACT SUSP 2 sprays per nostril 1-2 times daily. 06/16/22   Kennith Gain, MD  SUMAtriptan (IMITREX) 100 MG tablet Take 1 tablet (100 mg total) by mouth as needed at onset of migraine symptoms. May repeat in 1-2 hours if persistent. 12/09/21     tiZANidine (ZANAFLEX) 4 MG tablet Take 1 tablet (4 mg total) by mouth at bedtime. 02/22/22   Jaynee Eagles, PA-C  topiramate (TOPAMAX) 100 MG tablet Take 1 tablet (100 mg total) by mouth daily. 12/09/21     triamcinolone cream (KENALOG) 0.1 % Apply 1 Application topically 2 (two) times daily as needed. 04/05/22     VENTOLIN HFA 108 (90 Base) MCG/ACT inhaler Inhale 2 puffs into the lungs every 4 (four) hours as needed  for wheezing or shortness of breath. 06/16/22   Kennith Gain, MD  Vitamin D, Ergocalciferol, (DRISDOL) 1.25 MG (50000 UNIT) CAPS capsule Take 1 capsule (50,000 Units total) by mouth once a week. 07/21/21     dicyclomine (BENTYL) 10 MG capsule Take 1 capsule (10 mg total) by mouth 4 (four) times daily -  before meals and at bedtime. For stomach cramping Patient not taking: Reported on 10/25/2019 06/13/19 10/25/19  [name], Ander Gaster, MD  sucralfate (CARAFATE) 1 GM/10ML suspension Take 10 mLs (1 g total) by mouth 4 (four) times daily -  with meals and at bedtime. Patient not taking: Reported on 10/25/2019 06/13/19 10/25/19  Antony Blackbird, MD    Family History Family History  Problem Relation Age of Onset   Asthma Mother    Heart disease Mother    Hypertension Mother    Clotting disorder Mother    Diabetes Mother    Asthma Maternal Aunt    Hypertension Maternal Aunt    Diabetes Maternal Aunt    Asthma Maternal Uncle    Hypertension Maternal Uncle    Diabetes Maternal Uncle    Heart disease Maternal Grandmother    Diabetes Maternal Grandmother    Hypertension Maternal Grandmother    Heart disease Maternal Grandfather    Diabetes Maternal Grandfather    Hypertension Maternal Grandfather    Hypertension Paternal Grandmother    Diabetes Maternal Uncle    Heart disease Maternal Uncle    Diabetes Maternal Uncle    Cancer Paternal Aunt    Hypotension Neg Hx    Anesthesia problems Neg Hx    Malignant hyperthermia Neg Hx    Pseudochol deficiency Neg Hx     Social History Social History   Tobacco Use   Smoking status: Never    Passive exposure:  Never   Smokeless tobacco: Never  Vaping Use   Vaping Use: Never used  Substance Use Topics   Alcohol use: No    Alcohol/week: 0.0 standard drinks of alcohol   Drug use: No     Allergies   Latex, Other, Zofran [ondansetron], and Tree extract   Review of Systems Review of Systems  Neurological:  Positive for headaches.      Physical Exam Triage Vital Signs ED Triage Vitals  Enc Vitals Group     BP 08/19/22 1825 (!) 144/95     Pulse Rate 08/19/22 1825 94     Resp 08/19/22 1825 18     Temp 08/19/22 1825 98.4 F (36.9 C)     Temp Source 08/19/22 1825 Oral     SpO2 08/19/22 1825 95 %     Weight --      Height --      Head Circumference --      Peak Flow --      Pain Score 08/19/22 1828 8     Pain Loc --      Pain Edu? --      Excl. in [employer]? --    No data found.  Updated Vital Signs BP (!) 144/95 (BP Location: Left Arm)   Pulse 94   Temp 98.4 F (36.9 C) (Oral)   Resp 18   SpO2 95%   Visual Acuity Right Eye Distance:   Left Eye Distance:   Bilateral Distance:    Right Eye Near:   Left Eye Near:    Bilateral Near:     Physical Exam Vitals reviewed.  Constitutional:      General: She is not in acute distress.    Appearance: She is not toxic-appearing.  HENT:     Right Ear: Ear canal normal.     Left Ear: Ear canal normal.     Ears:     Comments: Bilateral tympanic membranes are pink and dull with altered landmarks    Nose: Congestion present.     Mouth/Throat:     Mouth: Mucous membranes are moist.     Comments: There is mild erythema of the posterior oropharynx Eyes:     Extraocular Movements: Extraocular movements intact.     Conjunctiva/sclera: Conjunctivae normal.     Pupils: Pupils are equal, round, and reactive to light.  Cardiovascular:     Rate and Rhythm: Normal rate and regular rhythm.     Heart sounds: No murmur heard. Pulmonary:     Effort: Pulmonary effort is normal. No respiratory distress.     Breath sounds: No stridor. No wheezing, rhonchi or rales.  Musculoskeletal:     Cervical back: Neck supple.  Lymphadenopathy:     Cervical: No cervical adenopathy.  Skin:    Capillary Refill: Capillary refill takes less than 2 seconds.     Coloration: Skin is not jaundiced or pale.  Neurological:     General: No focal deficit present.     Mental Status: She is  alert and oriented to person, place, and time.  Psychiatric:        Behavior: Behavior normal.      UC Treatments / Results  Labs (all labs ordered are listed, but only abnormal results are displayed) Labs Reviewed  SARS CORONAVIRUS 2 (TAT 6-24 HRS)    EKG   Radiology No results found.  Procedures Procedures (including critical care time)  Medications Ordered in UC Medications  ketorolac (TORADOL) 30 MG/ML injection 30 mg (has  no administration in time range)    Initial Impression / Assessment and Plan / UC Course  I have reviewed the triage vital signs and the nursing notes.  Pertinent labs & imaging results that were available during my care of the patient were reviewed by me and considered in my medical decision making (see chart for details).        Good to treat for ear infection with amoxicillin.  COVID swab was done and if positive she is a candidate for Paxlovid.  Her last EGFR was greater than 60 in late 2023.  We discussed options and really she has tried everything that I could prescribe her for her hand pain.  Toradol injection is given here to help her hand pain in the short-term. Final Clinical Impressions(s) / UC Diagnoses   Final diagnoses:  Viral URI  Bilateral otitis media, unspecified otitis media type  Left hand pain     Discharge Instructions      You have been given a shot of Toradol 30 mg today.  Take amoxicillin 875 mg--1 tab twice daily for 7 days   You have been swabbed for COVID, and the test will result in the next 24 hours. Our staff will call you if positive. If the COVID test is positive, you should quarantine until you are fever free for 24 hours and you are starting to feel better, and then take added precautions for the next 5 days, such as physical distancing/wearing a mask and good hand hygiene/washing.        ED Prescriptions     Medication Sig Dispense Auth. Provider   amoxicillin (AMOXIL) 875 MG tablet Take 1  tablet (875 mg total) by mouth 2 (two) times daily for 7 days. 14 tablet Panfilo Ketchum, Gwenlyn Perking, MD      I have reviewed the PDMP during this encounter.   Barrett Henle, MD 08/19/22 [phone]

## 2022-08-19 NOTE — Discharge Instructions (Addendum)
You have been given a shot of Toradol 30 mg today.  Take amoxicillin 875 mg--1 tab twice daily for 7 days   You have been swabbed for COVID, and the test will result in the next 24 hours. Our staff will call you if positive. If the COVID test is positive, you should quarantine until you are fever free for 24 hours and you are starting to feel better, and then take added precautions for the next 5 days, such as physical distancing/wearing a mask and good hand hygiene/washing.

## 2022-08-20 ENCOUNTER — Other Ambulatory Visit (HOSPITAL_COMMUNITY): Payer: Self-pay

## 2022-08-20 LAB — SARS CORONAVIRUS 2 (TAT 6-24 HRS): SARS Coronavirus 2: NEGATIVE

## 2022-08-20 MED ORDER — OXYCODONE-ACETAMINOPHEN 5-325 MG PO TABS
1.0000 | ORAL_TABLET | Freq: Two times a day (BID) | ORAL | 0 refills | Status: DC | PRN
  Filled 2022-08-20: qty 20, 10d supply, fill #0

## 2022-08-31 ENCOUNTER — Ambulatory Visit: Payer: Self-pay

## 2022-08-31 ENCOUNTER — Other Ambulatory Visit: Payer: Self-pay | Admitting: Nurse Practitioner

## 2022-08-31 DIAGNOSIS — S86911A Strain of unspecified muscle(s) and tendon(s) at lower leg level, right leg, initial encounter: Secondary | ICD-10-CM

## 2022-09-03 ENCOUNTER — Other Ambulatory Visit (HOSPITAL_COMMUNITY): Payer: Self-pay

## 2022-09-03 MED ORDER — OXYCODONE-ACETAMINOPHEN 5-325 MG PO TABS
1.0000 | ORAL_TABLET | Freq: Two times a day (BID) | ORAL | 0 refills | Status: DC
  Filled 2022-09-03: qty 20, 10d supply, fill #0

## 2022-09-06 ENCOUNTER — Other Ambulatory Visit (HOSPITAL_COMMUNITY): Payer: Self-pay

## 2022-09-14 ENCOUNTER — Other Ambulatory Visit (HOSPITAL_COMMUNITY): Payer: Self-pay

## 2022-09-14 MED ORDER — MELOXICAM 15 MG PO TABS
15.0000 mg | ORAL_TABLET | Freq: Every day | ORAL | 2 refills | Status: DC
  Filled 2022-09-14: qty 30, 30d supply, fill #0

## 2022-09-17 ENCOUNTER — Other Ambulatory Visit (HOSPITAL_COMMUNITY): Payer: Self-pay

## 2022-09-21 ENCOUNTER — Other Ambulatory Visit (HOSPITAL_COMMUNITY): Payer: Self-pay

## 2022-09-21 ENCOUNTER — Other Ambulatory Visit: Payer: Self-pay

## 2022-09-22 ENCOUNTER — Other Ambulatory Visit: Payer: Self-pay

## 2022-09-23 ENCOUNTER — Other Ambulatory Visit (HOSPITAL_COMMUNITY): Payer: Self-pay

## 2022-09-29 ENCOUNTER — Other Ambulatory Visit (HOSPITAL_COMMUNITY): Payer: Self-pay

## 2022-09-29 MED ORDER — OXYCODONE-ACETAMINOPHEN 5-325 MG PO TABS
1.0000 | ORAL_TABLET | Freq: Two times a day (BID) | ORAL | 0 refills | Status: DC | PRN
  Filled 2022-09-29: qty 20, 10d supply, fill #0

## 2022-10-15 ENCOUNTER — Ambulatory Visit: Payer: BLUE CROSS/BLUE SHIELD | Admitting: Allergy

## 2022-10-15 ENCOUNTER — Other Ambulatory Visit (HOSPITAL_COMMUNITY): Payer: Self-pay

## 2022-10-15 MED ORDER — OXYCODONE-ACETAMINOPHEN 5-325 MG PO TABS
1.0000 | ORAL_TABLET | Freq: Two times a day (BID) | ORAL | 0 refills | Status: DC | PRN
  Filled 2022-10-15: qty 20, 10d supply, fill #0

## 2022-10-18 ENCOUNTER — Encounter (HOSPITAL_COMMUNITY): Payer: Self-pay | Admitting: Emergency Medicine

## 2022-10-18 ENCOUNTER — Ambulatory Visit (HOSPITAL_COMMUNITY)
Admission: EM | Admit: 2022-10-18 | Discharge: 2022-10-18 | Disposition: A | Discharge status: Home / Self Care | Payer: BLUE CROSS/BLUE SHIELD | Attending: Internal Medicine | Admitting: Internal Medicine

## 2022-10-18 DIAGNOSIS — J301 Allergic rhinitis due to pollen: Secondary | ICD-10-CM | POA: Diagnosis not present

## 2022-10-18 DIAGNOSIS — J4531 Mild persistent asthma with (acute) exacerbation: Secondary | ICD-10-CM | POA: Diagnosis not present

## 2022-10-18 MED ORDER — PREDNISONE 20 MG PO TABS: 40.0000 mg | ORAL_TABLET | Freq: Every day | ORAL | 0 refills | Status: AC

## 2022-10-18 MED ORDER — GUAIFENESIN ER 600 MG PO TB12: 600.0000 mg | ORAL_TABLET | Freq: Two times a day (BID) | ORAL | 0 refills | Status: AC

## 2022-10-18 NOTE — Discharge Instructions (Addendum)
Please take medications as prescribed Continue using nasal saline spray Humidifier will help with nasal congestion Vapor rub will help with nasal congestion Please return to urgent care if you have worsening symptoms.

## 2022-10-18 NOTE — ED Triage Notes (Signed)
Pt had congestion for week. Reports bought nasal saline rinse and cold/congestion medications that aren't helping.

## 2022-10-19 NOTE — ED Provider Notes (Signed)
[facility]    CSN: [id number] Arrival date & time: 10/18/22  1933      History   Chief Complaint Chief Complaint  Patient presents with   Nasal Congestion    HPI Laurie Reed is a 37 y.o. female with a history of asthma and seasonal allergies comes to urgent care with nasal congestion of 1 week duration.  Patient says symptoms have been persistent.  Her symptoms has not improved despite steroid nasal spray, over-the-counter Mucinex and saline nasal spray use.  No fever or chills.  She has some postnasal drainage but denies any significant cough.  She has a history of asthma currently managed with 2 bronchodilator agents.  She complains of chest tightness and has been using her albuterol inhaler more frequently over the past week.  No sputum production.  Patient also complains ear fullness.  HPI  Past Medical History:  Diagnosis Date   Acid reflux 2004   Anemia    Anxiety 2008   Asthma 1993   no previous intubations or hospitalizations    Carpal tunnel syndrome    Chlamydia    Depression 2008   no meds, currenly ok   Eczema    Environmental allergies    GERD (gastroesophageal reflux disease)    Migraines    Miscarriage    Ovarian cyst    Pre-diabetes    Ulcer of the stomach and intestine 2004   Urinary tract infection     Patient Active Problem List   Diagnosis Date Noted   Asthmatic bronchitis with acute exacerbation 04/26/2022   Moderate persistent asthma 11/27/2021   GERD (gastroesophageal reflux disease) 11/27/2021   Normocytic anemia 11/27/2021   Acute severe exacerbation of moderate persistent allergic asthma 11/24/2021   Extensor tenosynovitis of wrist 04/30/2021   Carpal tunnel syndrome of left wrist 07/18/2020   Carpal tunnel syndrome of right wrist 07/18/2020   Chronic low back pain 12/26/2019   Anemia in pregnancy 01/23/2018   Morbid obesity with BMI of 45.0-49.9, adult (HCC) 10/19/2017   Seasonal allergic rhinitis 11/01/2016   Allergic  asthma 06/30/2015   Vitamin D deficiency 01/22/2015   Anxiety and depression 01/21/2015    Past Surgical History:  Procedure Laterality Date   CARPAL TUNNEL RELEASE Left    COLONOSCOPY  2004   IRRIGATION AND DEBRIDEMENT SEBACEOUS CYST N/A 06/01/2017   Procedure: EXCISION SEBACEOUS CYST ON SCALP;  Surgeon: Ancil Linsey, MD;  Location: [facility];  Service: General;  Laterality: N/A;   MULTIPLE TOOTH EXTRACTIONS  2015   6 teeth removed    TUBAL LIGATION N/A 04/04/2018   Procedure: POST PARTUM TUBAL LIGATION;  Surgeon: Levie Heritage, DO;  Location: [facility];  Service: Gynecology;  Laterality: N/A;   TYMPANOSTOMY TUBE PLACEMENT     WISDOM TOOTH EXTRACTION  2007    OB History     Gravida  7   Para  4   Term  4   Preterm  0   AB  3   Living  4      SAB  3   IAB  0   Ectopic  0   Multiple  0   Live Births  4            Home Medications    Prior to Admission medications   Medication Sig Start Date End Date Taking? Authorizing Provider  guaiFENesin (MUCINEX) 600 MG 12 hr tablet Take 1 tablet (600 mg total) by mouth 2 (two) times daily  for 10 days. 10/18/22 10/28/22 Yes Madilyn Cephas, Britta Mccreedy, MD  predniSONE (DELTASONE) 20 MG tablet Take 2 tablets (40 mg total) by mouth daily for 5 days. 10/18/22 10/23/22 Yes Kasch Borquez, Britta Mccreedy, MD  acetaminophen (TYLENOL) 500 MG tablet Take 500-1,000 mg by mouth every 6 (six) hours as needed for mild pain, moderate pain or headache (or migraines).     [provider]  albuterol (PROVENTIL) (2.5 MG/3ML) 0.083% nebulizer solution Use 1 vial via nebulizer every 6 hours and as needed Patient taking differently: Take 2.5 mg by nebulization every 6 (six) hours. Shortness of breath 05/15/21     albuterol (VENTOLIN HFA) 108 (90 Base) MCG/ACT inhaler Inhale 2 puffs into the lungs 4 (four) times daily as needed. 05/15/21     albuterol (VENTOLIN HFA) 108 (90 Base) MCG/ACT inhaler Inhale 2 puffs into the lungs 4 (four) times daily  as needed. 12/09/21     amitriptyline (ELAVIL) 25 MG tablet Take 1 tablet (25 mg total) by mouth at bedtime as needed. Patient taking differently: Take 25 mg by mouth at bedtime as needed for sleep. 09/16/21     amitriptyline (ELAVIL) 25 MG tablet Take 1 tablet (25 mg total) by mouth at bedtime as needed. 12/09/21     calcium carbonate (TUMS - DOSED IN MG ELEMENTAL CALCIUM) 500 MG chewable tablet Chew 1-3 tablets by mouth daily as needed for indigestion or heartburn.    [provider]  Chlorphen-PE-Acetaminophen (NOREL AD) 4-10-325 MG TABS Take 1 tablet by mouth 2 (two) times daily as needed for cough and congestion 01/25/22     cromolyn (OPTICROM) 4 % ophthalmic solution Place 1 drop into both eyes 4 (four) times daily as needed  for itchy/watery eyes. 06/16/22   Marcelyn Bruins, MD  diphenhydrAMINE (BENADRYL) 25 mg capsule Take 25-50 mg by mouth 2 (two) times daily as needed for allergies.    [provider]  ferrous sulfate 324 (65 Fe) MG TBEC 1 pill daily after meal to help with anemia 11/22/18   [name], [name], MD  fluticasone (FLONASE ALLERGY RELIEF) 50 MCG/ACT nasal spray Place 2 sprays into both nostrils daily. 12/09/21     fluticasone-salmeterol (ADVAIR DISKUS) 500-50 MCG/ACT AEPB Inhale 1 puff into the lungs in the morning and at bedtime. 12/24/21   Charlott Holler, MD  gabapentin (NEURONTIN) 100 MG capsule Take 1 capsule (100 mg total) by mouth 3 (three) times daily. 04/21/22     hydrocortisone 2.5 % cream Apply 2 times daily as needed for bumps 05/15/21     ibuprofen (ADVIL) 800 MG tablet Take 1 tablet (800 mg total) by mouth every 6 (six) hours as needed for pain 02/10/22     levocetirizine (XYZAL) 5 MG tablet Take 1 tablet (5 mg total) by mouth every evening. 06/16/22   Marcelyn Bruins, MD  meloxicam (MOBIC) 15 MG tablet Take 1 tablet (15 mg total) by mouth daily with meal 06/10/22     meloxicam (MOBIC) 15 MG tablet Take 1 tablet (15 mg total) by mouth daily.  09/14/22     montelukast (SINGULAIR) 10 MG tablet Take 1 tablet (10 mg total) by mouth daily. 12/09/21     montelukast (SINGULAIR) 10 MG tablet Take 1 tablet (10 mg total) by mouth at bedtime. 06/16/22   Marcelyn Bruins, MD  omeprazole (PRILOSEC) 40 MG capsule Take 1 capsule (40 mg total) by mouth daily. 12/24/21 12/24/22  Charlott Holler, MD  oxyCODONE-acetaminophen (PERCOCET/ROXICET) 5-325 MG tablet Take 1 tablet by  mouth 2 (two) times daily as needed for 10 days 04/12/22     oxyCODONE-acetaminophen (PERCOCET/ROXICET) 5-325 MG tablet Take 1 tablet by mouth 2 (two) times daily as needed for 10 days 04/23/22     oxyCODONE-acetaminophen (PERCOCET/ROXICET) 5-325 MG tablet Take 1 tablet by mouth 2 (two) times daily as needed for 10 days 06/23/22     oxyCODONE-acetaminophen (PERCOCET/ROXICET) 5-325 MG tablet Take 1 tablet by mouth 2 (two) times daily as needed for 10 days. 07/11/22     oxyCODONE-acetaminophen (PERCOCET/ROXICET) 5-325 MG tablet Take 1 tablet by mouth 2 (two) times daily as needed for 10 days 08/20/22     oxyCODONE-acetaminophen (PERCOCET/ROXICET) 5-325 MG tablet Take 1 tablet by mouth 2 (two) times daily for 10 days as needed. 09/03/22     oxyCODONE-acetaminophen (PERCOCET/ROXICET) 5-325 MG tablet Take 1 tablet by mouth 2 (two) times daily as needed for 10 days 09/29/22     oxyCODONE-acetaminophen (PERCOCET/ROXICET) 5-325 MG tablet Take 1 tablet by mouth 2 (two) times daily as needed for 10 days 10/15/22     phentermine (ADIPEX-P) 37.5 MG tablet Take 1 tablet (37.5 mg total) by mouth in the morning. 10/14/21     phentermine (ADIPEX-P) 37.5 MG tablet Take 1 tablet (37.5 mg total) by mouth in the morning. 04/21/22     RYALTRIS 665-25 MCG/ACT SUSP 2 sprays per nostril 1-2 times daily. 06/16/22   Marcelyn Bruins, MD  SUMAtriptan (IMITREX) 100 MG tablet Take 1 tablet (100 mg total) by mouth as needed at onset of migraine symptoms. May repeat in 1-2 hours if persistent. 12/09/21     tiZANidine  (ZANAFLEX) 4 MG tablet Take 1 tablet (4 mg total) by mouth at bedtime. 02/22/22   Wallis Bamberg, PA-C  topiramate (TOPAMAX) 100 MG tablet Take 1 tablet (100 mg total) by mouth daily. 12/09/21     triamcinolone cream (KENALOG) 0.1 % Apply 1 Application topically 2 (two) times daily as needed. 04/05/22     VENTOLIN HFA 108 (90 Base) MCG/ACT inhaler Inhale 2 puffs into the lungs every 4 (four) hours as needed for wheezing or shortness of breath. 06/16/22   Marcelyn Bruins, MD  Vitamin D, Ergocalciferol, (DRISDOL) 1.25 MG (50000 UNIT) CAPS capsule Take 1 capsule (50,000 Units total) by mouth once a week. 07/21/21     dicyclomine (BENTYL) 10 MG capsule Take 1 capsule (10 mg total) by mouth 4 (four) times daily -  before meals and at bedtime. For stomach cramping Patient not taking: Reported on 10/25/2019 06/13/19 10/25/19  [name], Hewitt Shorts, MD  sucralfate (CARAFATE) 1 GM/10ML suspension Take 10 mLs (1 g total) by mouth 4 (four) times daily -  with meals and at bedtime. Patient not taking: Reported on 10/25/2019 06/13/19 10/25/19  Cain Saupe, MD    Family History Family History  Problem Relation Age of Onset   Asthma Mother    Heart disease Mother    Hypertension Mother    Clotting disorder Mother    Diabetes Mother    Asthma Maternal Aunt    Hypertension Maternal Aunt    Diabetes Maternal Aunt    Asthma Maternal Uncle    Hypertension Maternal Uncle    Diabetes Maternal Uncle    Heart disease Maternal Grandmother    Diabetes Maternal Grandmother    Hypertension Maternal Grandmother    Heart disease Maternal Grandfather    Diabetes Maternal Grandfather    Hypertension Maternal Grandfather    Hypertension Paternal Grandmother    Diabetes Maternal Uncle  Heart disease Maternal Uncle    Diabetes Maternal Uncle    Cancer Paternal Aunt    Hypotension Neg Hx    Anesthesia problems Neg Hx    Malignant hyperthermia Neg Hx    Pseudochol deficiency Neg Hx     Social History Social History    Tobacco Use   Smoking status: Never    Passive exposure: Never   Smokeless tobacco: Never  Vaping Use   Vaping Use: Never used  Substance Use Topics   Alcohol use: No    Alcohol/week: 0.0 standard drinks of alcohol   Drug use: No     Allergies   Latex, Other, Zofran [ondansetron], and Tree extract   Review of Systems Review of Systems As per HPI  Physical Exam Triage Vital Signs ED Triage Vitals  Enc Vitals Group     BP 10/18/22 2039 108/74     Pulse Rate 10/18/22 2039 94     Resp 10/18/22 2039 18     Temp 10/18/22 2039 98.9 F (37.2 C)     Temp Source 10/18/22 2039 Oral     SpO2 10/18/22 2039 98 %     Weight --      Height --      Head Circumference --      Peak Flow --      Pain Score 10/18/22 2038 0     Pain Loc --      Pain Edu? --      Excl. in GC? --    No data found.  Updated Vital Signs BP 108/74 (BP Location: Left Arm)   Pulse 94   Temp 98.9 F (37.2 C) (Oral)   Resp 18   LMP 10/04/2022   SpO2 98%   Visual Acuity Right Eye Distance:   Left Eye Distance:   Bilateral Distance:    Right Eye Near:   Left Eye Near:    Bilateral Near:     Physical Exam Vitals and nursing note reviewed.  Constitutional:      Appearance: Normal appearance.  HENT:     Right Ear: Tympanic membrane normal.     Left Ear: Tympanic membrane normal.     Nose: Congestion present.     Mouth/Throat:     Pharynx: No posterior oropharyngeal erythema.  Eyes:     Extraocular Movements: Extraocular movements intact.     Pupils: Pupils are equal, round, and reactive to light.  Cardiovascular:     Rate and Rhythm: Normal rate and regular rhythm.     Pulses: Normal pulses.     Heart sounds: Normal heart sounds.  Neurological:     Mental Status: She is alert.      UC Treatments / Results  Labs (all labs ordered are listed, but only abnormal results are displayed) Labs Reviewed - No data to display  EKG   Radiology No results  found.  Procedures Procedures (including critical care time)  Medications Ordered in UC Medications - No data to display  Initial Impression / Assessment and Plan / UC Course  I have reviewed the triage vital signs and the nursing notes.  Pertinent labs & imaging results that were available during my care of the patient were reviewed by me and considered in my medical decision making (see chart for details).    1.  Nonseasonal allergic rhinitis: Patient is advised to continue using nasal spray VapoRub use recommended Saline nasal spray  2.  Mild persistent asthma with acute exacerbation: Patient is advised  to continue bronchodilator use as well as rescue inhaler use Prednisone 40 mg orally daily for 5 days Return precautions given  Exam is reassuring.  Final Clinical Impressions(s) / UC Diagnoses   Final diagnoses:  Mild persistent asthma with (acute) exacerbation  Non-seasonal allergic rhinitis due to pollen     Discharge Instructions      Please take medications as prescribed Continue using nasal saline spray Humidifier will help with nasal congestion Vapor rub will help with nasal congestion Please return to urgent care if you have worsening symptoms.   ED Prescriptions     Medication Sig Dispense Auth. Provider   predniSONE (DELTASONE) 20 MG tablet Take 2 tablets (40 mg total) by mouth daily for 5 days. 10 tablet Eloise Mula, Britta Mccreedy, MD   guaiFENesin (MUCINEX) 600 MG 12 hr tablet Take 1 tablet (600 mg total) by mouth 2 (two) times daily for 10 days. 20 tablet Corby Villasenor, Britta Mccreedy, MD      PDMP not reviewed this encounter.   Merrilee Jansky, MD 10/19/22 2020

## 2022-10-27 ENCOUNTER — Other Ambulatory Visit (HOSPITAL_COMMUNITY): Payer: Self-pay

## 2022-10-27 ENCOUNTER — Ambulatory Visit
Admission: RE | Admit: 2022-10-27 | Discharge: 2022-10-27 | Disposition: A | Discharge status: Home / Self Care | Payer: BLUE CROSS/BLUE SHIELD | Source: Ambulatory Visit | Attending: Emergency Medicine | Admitting: Emergency Medicine

## 2022-10-27 VITALS — BP 123/77 | HR 103 | Temp 97.9°F | Resp 18

## 2022-10-27 DIAGNOSIS — M79604 Pain in right leg: Secondary | ICD-10-CM | POA: Diagnosis not present

## 2022-10-27 DIAGNOSIS — M545 Low back pain, unspecified: Secondary | ICD-10-CM | POA: Diagnosis not present

## 2022-10-27 DIAGNOSIS — M546 Pain in thoracic spine: Secondary | ICD-10-CM

## 2022-10-27 MED ORDER — PREDNISONE 50 MG PO TABS
50.0000 mg | ORAL_TABLET | Freq: Every day | ORAL | 0 refills | Status: AC
  Filled 2022-10-27: qty 5, 5d supply, fill #0

## 2022-10-27 MED ORDER — KETOROLAC TROMETHAMINE 30 MG/ML IJ SOLN
30.0000 mg | Freq: Once | INTRAMUSCULAR | Status: AC
  Administered 2022-10-27: 30 mg via INTRAMUSCULAR

## 2022-10-27 NOTE — Discharge Instructions (Signed)
Your pain is most likely caused by irritation to the muscles.  Injection of Toradol today to reduce inflammation which interacted with pain, daily to see some relief in about 30 minutes to an hour  Starting tomorrow to prednisone every morning for 5 days to continue the above process, may take Tylenol and muscle relaxants in addition to this  You may use heating pad in 15 minute intervals as needed for additional comfort,you may find comfort in using ice in 10-15 minutes over affected area  Begin stretching affected area daily for 10 minutes as tolerated to further loosen muscles   When lying down place pillow underneath and between knees for support  Can try sleeping without pillow on firm mattress   Practice good posture: head back, shoulders back, chest forward, pelvis back and weight distributed evenly on both legs  If pain persist after recommended treatment or reoccurs if may be beneficial to follow up with orthopedic specialist for evaluation, this doctor specializes in the bones and can manage your symptoms long-term with options such as but not limited to imaging, medications or physical therapy
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
## 2022-11-03 ENCOUNTER — Other Ambulatory Visit (HOSPITAL_COMMUNITY): Payer: Self-pay

## 2022-11-03 MED ORDER — OXYCODONE-ACETAMINOPHEN 5-325 MG PO TABS
1.0000 | ORAL_TABLET | Freq: Two times a day (BID) | ORAL | 0 refills | Status: DC | PRN
  Filled 2022-11-03: qty 20, 10d supply, fill #0

## 2022-11-10 ENCOUNTER — Other Ambulatory Visit (HOSPITAL_COMMUNITY): Payer: Self-pay

## 2022-11-12 ENCOUNTER — Other Ambulatory Visit: Payer: Self-pay

## 2022-11-12 ENCOUNTER — Other Ambulatory Visit (HOSPITAL_COMMUNITY): Payer: Self-pay

## 2022-11-17 ENCOUNTER — Other Ambulatory Visit (HOSPITAL_COMMUNITY): Payer: Self-pay

## 2022-11-17 MED ORDER — TRIAMCINOLONE ACETONIDE 0.1 % EX CREA
1.0000 | TOPICAL_CREAM | Freq: Two times a day (BID) | CUTANEOUS | 2 refills | Status: AC | PRN
  Filled 2022-11-17: qty 454, 30d supply, fill #0
  Filled 2023-04-27: qty 454, 30d supply, fill #1
  Filled 2023-09-22 – 2023-10-05 (×2): qty 454, 30d supply, fill #2

## 2022-11-20 ENCOUNTER — Emergency Department (HOSPITAL_BASED_OUTPATIENT_CLINIC_OR_DEPARTMENT_OTHER): Payer: BLUE CROSS/BLUE SHIELD | Admitting: Radiology

## 2022-11-20 ENCOUNTER — Encounter (HOSPITAL_BASED_OUTPATIENT_CLINIC_OR_DEPARTMENT_OTHER): Payer: Self-pay

## 2022-11-20 ENCOUNTER — Emergency Department (HOSPITAL_BASED_OUTPATIENT_CLINIC_OR_DEPARTMENT_OTHER)
Admission: EM | Admit: 2022-11-20 | Discharge: 2022-11-20 | Disposition: A | Discharge status: Home / Self Care | Payer: BLUE CROSS/BLUE SHIELD | Attending: Emergency Medicine | Admitting: Emergency Medicine

## 2022-11-20 DIAGNOSIS — M62830 Muscle spasm of back: Secondary | ICD-10-CM | POA: Insufficient documentation

## 2022-11-20 DIAGNOSIS — E119 Type 2 diabetes mellitus without complications: Secondary | ICD-10-CM | POA: Diagnosis not present

## 2022-11-20 DIAGNOSIS — Z91040 Latex allergy status: Secondary | ICD-10-CM | POA: Insufficient documentation

## 2022-11-20 DIAGNOSIS — M545 Low back pain, unspecified: Secondary | ICD-10-CM | POA: Diagnosis not present

## 2022-11-20 LAB — PREGNANCY, URINE: Preg Test, Ur: NEGATIVE

## 2022-11-20 MED ORDER — LIDOCAINE 5 % EX PTCH: 1.0000 | MEDICATED_PATCH | CUTANEOUS | 0 refills | Status: DC

## 2022-11-20 MED ORDER — OXYCODONE HCL 5 MG PO TABS: 5.0000 mg | ORAL_TABLET | Freq: Four times a day (QID) | ORAL | 0 refills | Status: AC | PRN

## 2022-11-20 MED ORDER — PROMETHAZINE HCL 25 MG PO TABS: 12.5000 mg | ORAL_TABLET | Freq: Once | ORAL | Status: DC

## 2022-11-20 MED ORDER — DEXAMETHASONE SODIUM PHOSPHATE 10 MG/ML IJ SOLN
10.0000 mg | Freq: Once | INTRAMUSCULAR | Status: AC
  Administered 2022-11-20: 10 mg via INTRAMUSCULAR
  Filled 2022-11-20: qty 1

## 2022-11-20 MED ORDER — OXYCODONE-ACETAMINOPHEN 5-325 MG PO TABS
1.0000 | ORAL_TABLET | ORAL | Status: AC | PRN
  Administered 2022-11-20 (×2): 1 via ORAL
  Filled 2022-11-20 (×2): qty 1

## 2022-11-20 MED ORDER — ACETAMINOPHEN 325 MG PO TABS
650.0000 mg | ORAL_TABLET | Freq: Once | ORAL | Status: AC
  Administered 2022-11-20: 650 mg via ORAL
  Filled 2022-11-20: qty 2

## 2022-11-20 MED ORDER — DIAZEPAM 2 MG PO TABS
2.0000 mg | ORAL_TABLET | Freq: Once | ORAL | Status: AC
  Administered 2022-11-20: 2 mg via ORAL
  Filled 2022-11-20: qty 1

## 2022-11-20 MED ORDER — METHYLPREDNISOLONE 4 MG PO TBPK: ORAL_TABLET | ORAL | 0 refills | Status: DC

## 2022-11-20 MED ORDER — KETOROLAC TROMETHAMINE 60 MG/2ML IM SOLN
60.0000 mg | Freq: Once | INTRAMUSCULAR | Status: AC
  Administered 2022-11-20: 60 mg via INTRAMUSCULAR
  Filled 2022-11-20: qty 2

## 2022-11-20 MED ORDER — LIDOCAINE 5 % EX PTCH
1.0000 | MEDICATED_PATCH | CUTANEOUS | Status: DC
  Administered 2022-11-20: 1 via TRANSDERMAL
  Filled 2022-11-20: qty 1

## 2022-11-20 MED ORDER — TIZANIDINE HCL 4 MG PO TABS: 4.0000 mg | ORAL_TABLET | Freq: Every day | ORAL | 0 refills | Status: AC

## 2022-11-20 NOTE — ED Notes (Signed)
Patient transported to X-ray 

## 2022-11-20 NOTE — Discharge Instructions (Addendum)
Overall looks like maybe some arthritis in the back but there is no obvious disc herniation or fracture.  Follow-up with Dr. Jake Samples with neurosurgery.  Take medications as prescribed.  Denies seen and Roxicodone are sedating medications.  Do not drive or do any dangerous activities while taking this medicine.

## 2022-11-20 NOTE — ED Triage Notes (Signed)
Pt c/o lower back pain. Pt sates she had pain trying to go to the restroom. Pt has been able to void/have Bms. Pt states her children have been helping her wipe.

## 2022-11-20 NOTE — ED Notes (Signed)
Pt received and understood general operations of walker use.

## 2022-11-20 NOTE — ED Provider Notes (Signed)
[place] EMERGENCY DEPARTMENT AT [facility] Provider Note   CSN: [id number] Arrival date & time: 11/20/22  1605     History  Chief Complaint  Patient presents with   Back Pain    Laurie Reed is a 37 y.o. female.  Patient here with low back pain.  Denies any trauma.  Worse over the last 2 days.  History of the same.  Denies any numbness, weakness.  Pain is focally in her low back.  She denies any fevers or chills.  History of anxiety, diabetes.  She denies any bowel or bladder issues.  She has been taking opioids to help but ran out today.  Denies any numbness or tingling or weakness.  The history is provided by the patient.       Home Medications Prior to Admission medications   Medication Sig Start Date End Date Taking? Authorizing Provider  lidocaine (LIDODERM) 5 % Place 1 patch onto the skin daily. Remove & Discard patch within 12 hours or as directed by MD 11/20/22  Yes Lockie Mola, Canaan Holzer, DO  methylPREDNISolone (MEDROL DOSEPAK) 4 MG TBPK tablet Follow package insert 11/20/22  Yes Willow Shidler, DO  oxyCODONE (ROXICODONE) 5 MG immediate release tablet Take 1 tablet (5 mg total) by mouth every 6 (six) hours as needed for up to 10 days for breakthrough pain. 11/20/22 11/30/22 Yes Mishawn Didion, DO  acetaminophen (TYLENOL) 500 MG tablet Take 500-1,000 mg by mouth every 6 (six) hours as needed for mild pain, moderate pain or headache (or migraines).     [provider]  albuterol (PROVENTIL) (2.5 MG/3ML) 0.083% nebulizer solution Use 1 vial via nebulizer every 6 hours and as needed Patient taking differently: Take 2.5 mg by nebulization every 6 (six) hours. Shortness of breath 05/15/21     albuterol (VENTOLIN HFA) 108 (90 Base) MCG/ACT inhaler Inhale 2 puffs into the lungs 4 (four) times daily as needed. 05/15/21     albuterol (VENTOLIN HFA) 108 (90 Base) MCG/ACT inhaler Inhale 2 puffs into the lungs 4 (four) times daily as needed. 12/09/21     amitriptyline (ELAVIL)  25 MG tablet Take 1 tablet (25 mg total) by mouth at bedtime as needed. Patient taking differently: Take 25 mg by mouth at bedtime as needed for sleep. 09/16/21     amitriptyline (ELAVIL) 25 MG tablet Take 1 tablet (25 mg total) by mouth at bedtime as needed. 12/09/21     calcium carbonate (TUMS - DOSED IN MG ELEMENTAL CALCIUM) 500 MG chewable tablet Chew 1-3 tablets by mouth daily as needed for indigestion or heartburn.    [provider]  Chlorphen-PE-Acetaminophen (NOREL AD) 4-10-325 MG TABS Take 1 tablet by mouth 2 (two) times daily as needed for cough and congestion 01/25/22     cromolyn (OPTICROM) 4 % ophthalmic solution Place 1 drop into both eyes 4 (four) times daily as needed  for itchy/watery eyes. 06/16/22   Marcelyn Bruins, MD  diphenhydrAMINE (BENADRYL) 25 mg capsule Take 25-50 mg by mouth 2 (two) times daily as needed for allergies.    [provider]  ferrous sulfate 324 (65 Fe) MG TBEC 1 pill daily after meal to help with anemia 11/22/18   [name], [name], MD  fluticasone (FLONASE ALLERGY RELIEF) 50 MCG/ACT nasal spray Place 2 sprays into both nostrils daily. 12/09/21     fluticasone-salmeterol (ADVAIR DISKUS) 500-50 MCG/ACT AEPB Inhale 1 puff into the lungs in the morning and at bedtime. 12/24/21   Charlott Holler, MD  gabapentin (  NEURONTIN) 100 MG capsule Take 1 capsule (100 mg total) by mouth 3 (three) times daily. 04/21/22     hydrocortisone 2.5 % cream Apply 2 times daily as needed for bumps 05/15/21     ibuprofen (ADVIL) 800 MG tablet Take 1 tablet (800 mg total) by mouth every 6 (six) hours as needed for pain 02/10/22     levocetirizine (XYZAL) 5 MG tablet Take 1 tablet (5 mg total) by mouth every evening. 06/16/22   Marcelyn Bruins, MD  meloxicam (MOBIC) 15 MG tablet Take 1 tablet (15 mg total) by mouth daily with meal 06/10/22     meloxicam (MOBIC) 15 MG tablet Take 1 tablet (15 mg total) by mouth daily. 09/14/22     montelukast (SINGULAIR) 10 MG tablet  Take 1 tablet (10 mg total) by mouth daily. 12/09/21     montelukast (SINGULAIR) 10 MG tablet Take 1 tablet (10 mg total) by mouth at bedtime. 06/16/22   Marcelyn Bruins, MD  omeprazole (PRILOSEC) 40 MG capsule Take 1 capsule (40 mg total) by mouth daily. 12/24/21 12/24/22  Charlott Holler, MD  phentermine (ADIPEX-P) 37.5 MG tablet Take 1 tablet (37.5 mg total) by mouth in the morning. 10/14/21     phentermine (ADIPEX-P) 37.5 MG tablet Take 1 tablet (37.5 mg total) by mouth in the morning. 04/21/22     RYALTRIS 665-25 MCG/ACT SUSP 2 sprays per nostril 1-2 times daily. 06/16/22   Marcelyn Bruins, MD  SUMAtriptan (IMITREX) 100 MG tablet Take 1 tablet (100 mg total) by mouth as needed at onset of migraine symptoms. May repeat in 1-2 hours if persistent. 12/09/21     tiZANidine (ZANAFLEX) 4 MG tablet Take 1 tablet (4 mg total) by mouth at bedtime. 11/20/22   Quentin Shorey, DO  topiramate (TOPAMAX) 100 MG tablet Take 1 tablet (100 mg total) by mouth daily. 12/09/21     triamcinolone cream (KENALOG) 0.1 % Apply 1 Application topically 2 (two) times daily as needed. 11/17/22     VENTOLIN HFA 108 (90 Base) MCG/ACT inhaler Inhale 2 puffs into the lungs every 4 (four) hours as needed for wheezing or shortness of breath. 06/16/22   Marcelyn Bruins, MD  Vitamin D, Ergocalciferol, (DRISDOL) 1.25 MG (50000 UNIT) CAPS capsule Take 1 capsule (50,000 Units total) by mouth once a week. 07/21/21     dicyclomine (BENTYL) 10 MG capsule Take 1 capsule (10 mg total) by mouth 4 (four) times daily -  before meals and at bedtime. For stomach cramping Patient not taking: Reported on 10/25/2019 06/13/19 10/25/19  [name], Hewitt Shorts, MD  sucralfate (CARAFATE) 1 GM/10ML suspension Take 10 mLs (1 g total) by mouth 4 (four) times daily -  with meals and at bedtime. Patient not taking: Reported on 10/25/2019 06/13/19 10/25/19  Cain Saupe, MD      Allergies    Latex, Other, Zofran [ondansetron], and Tree extract    Review of  Systems   Review of Systems  Physical Exam Updated Vital Signs BP (!) 134/92 (BP Location: Right Arm)   Pulse 96   Temp 98.4 F (36.9 C) (Oral)   Resp 16   Ht 5\' 2"  (1.575 m)   Wt 123.4 kg   LMP 10/27/2022   SpO2 100%   BMI 49.75 kg/m  Physical Exam Vitals and nursing note reviewed.  Constitutional:      General: She is not in acute distress.    Appearance: She is well-developed. She is not ill-appearing.  HENT:  Head: Normocephalic and atraumatic.     Nose: Nose normal.     Mouth/Throat:     Mouth: Mucous membranes are moist.  Eyes:     Extraocular Movements: Extraocular movements intact.     Conjunctiva/sclera: Conjunctivae normal.     Pupils: Pupils are equal, round, and reactive to light.  Cardiovascular:     Rate and Rhythm: Normal rate and regular rhythm.     Pulses: Normal pulses.     Heart sounds: Normal heart sounds. No murmur heard. Pulmonary:     Effort: Pulmonary effort is normal. No respiratory distress.     Breath sounds: Normal breath sounds.  Abdominal:     General: Abdomen is flat.     Palpations: Abdomen is soft.     Tenderness: There is no abdominal tenderness.  Musculoskeletal:        General: Tenderness present. Normal range of motion.     Cervical back: Normal range of motion and neck supple.     Comments: Tender in the paraspinal lumbar area no midline spinal tenderness  Skin:    General: Skin is warm and dry.     Capillary Refill: Capillary refill takes less than 2 seconds.  Neurological:     General: No focal deficit present.     Mental Status: She is alert and oriented to person, place, and time.     Cranial Nerves: No cranial nerve deficit.     Sensory: No sensory deficit.     Motor: No weakness.     Coordination: Coordination normal.     Comments: 5+ out of 5 strength throughout, normal sensation     ED Results / Procedures / Treatments   Labs (all labs ordered are listed, but only abnormal results are displayed) Labs  Reviewed  PREGNANCY, URINE    EKG None  Radiology DG Lumbar Spine Complete  Result Date: 11/20/2022 CLINICAL DATA:  Low back pain.  No known injury. EXAM: LUMBAR SPINE - COMPLETE 4+ VIEW COMPARISON:  12/14/2019. FINDINGS: There is no evidence of acute lumbar spine fracture. Transitional vertebral body is present at S1. There is mild retrolisthesis at L5-S1. Intervertebral disc spaces are maintained. Facet arthropathy is noted in the lower lumbar spine. Mild sclerosis is noted at the sacroiliac joints bilaterally, suggesting sacroiliitis. Tubal ligation clips are noted in the pelvis. IMPRESSION: Facet arthropathy in the lower lumbar spine. Electronically Signed   By: Thornell Sartorius M.D.   On: 11/20/2022 21:20    Procedures Procedures    Medications Ordered in ED Medications  lidocaine (LIDODERM) 5 % 1 patch (1 patch Transdermal Patch Applied 11/20/22 2028)  ketorolac (TORADOL) injection 60 mg (has no administration in time range)  dexamethasone (DECADRON) injection 10 mg (has no administration in time range)  oxyCODONE-acetaminophen (PERCOCET/ROXICET) 5-325 MG per tablet 1 tablet (1 tablet Oral Given 11/20/22 1735)  diazepam (VALIUM) tablet 2 mg (2 mg Oral Given 11/20/22 2028)  acetaminophen (TYLENOL) tablet 650 mg (650 mg Oral Given 11/20/22 2028)    ED Course/ Medical Decision Making/ A&P                             Medical Decision Making Amount and/or Complexity of Data Reviewed Labs: ordered. Radiology: ordered.  Risk OTC drugs. Prescription drug management.   Caris Rackley is here with back pain.  Normal vitals.  No fever.  History of back issues.  Differential diagnosis likely herniated disc, sciatica, muscle spasm, seems less  likely to be traumatic process.  Will get lumbar x-ray given that she is got a fair amount of tenderness.  She is neurovascular neuromuscular intact.  She does not have any symptoms suggest cauda equina.  She has no fever.  Have no concern for infectious  process.  She is already been given narcotic pain medicine while in the waiting room.  Will add Valium, Tylenol.  Will await pregnancy test and give Toradol and Decadron as well.  Lidocaine patch also as prescribed.  Overall x-ray per radiology report no acute findings.  She is feeling much improved after medicine provided.  Will prescribe Roxicodone, Medrol Dosepak.  Patient discharged in good condition.  This chart was dictated using voice recognition software.  Despite best efforts to proofread,  errors can occur which can change the documentation meaning.         Final Clinical Impression(s) / ED Diagnoses Final diagnoses:  Acute left-sided low back pain, unspecified whether sciatica present  Spasm of back muscles    Rx / DC Orders ED Discharge Orders          Ordered    methylPREDNISolone (MEDROL DOSEPAK) 4 MG TBPK tablet        11/20/22 2133    lidocaine (LIDODERM) 5 %  Every 24 hours        11/20/22 2133    oxyCODONE (ROXICODONE) 5 MG immediate release tablet  Every 6 hours PRN        11/20/22 2133    tiZANidine (ZANAFLEX) 4 MG tablet  Daily at bedtime        11/20/22 2133              Virgina Norfolk, DO 11/20/22 2133

## 2022-11-22 ENCOUNTER — Other Ambulatory Visit (HOSPITAL_COMMUNITY): Payer: Self-pay

## 2022-11-23 NOTE — Progress Notes (Signed)
Office Visit Note  Patient: Laurie Reed             Date of Birth: 1985-09-05           MRN: [id number]             PCP: Fleet Contras, MD Referring: Bradly Bienenstock, MD Visit Date: 12/07/2022 Occupation: @GUAROCC @  Subjective:  Pain in multiple joints  History of Present Illness: Laurie Reed is a 37 y.o. female seen in consultation per request of Dr. Melvyn Novas.  According to the patient she was diagnosed with bilateral carpal tunnel syndrome.  She underwent left carpal tunnel release by Dr. Melvyn Novas about 2 years ago without any improvement.  She continues to have pain and discomfort in her left hand and tingling.  She states she has episodic increased pain and weakness in her left hand including her left arm.  She states she has episodic episodes when she is unable to completely open her fingers which last for few days.  She has symptoms of right carpal tunnel syndrome but Dr. Melvyn Novas is hesitant to do surgery due to her results of poor response to surgery for left carpal tunnel syndrome.  She gives history of chronic lower back pain for many years.  She states 2 weeks ago her lower back gave out to the point she has been using a walker.  She had x-rays of the lumbar spine which showed facet joint arthropathy and then she was referred to Dr. Orson Gear.  She states after evaluation Dr. Orson Gear advised physical therapy.  She also injured her right knee joint at work in April 2024.  She was evaluated at [facility] orthopedics where she was diagnosed with osteoarthritis.  She had a cortisone injection last week without any improvement.  None of the other joints are painful or swollen.  There is no history of oral ulcers, nasal ulcers, malar rash, photosensitivity, Raynaud's phenomenon or lymphadenopathy.  There is no family history of autoimmune disease.  Gravida 7, para 4, miscarriages 3.  There is no history of preeclampsia.  She had gestational diabetes.   Activities of Daily Living:  Patient  reports morning stiffness for several hours.   Patient Reports nocturnal pain.  Difficulty dressing/grooming: Reports Difficulty climbing stairs: Reports Difficulty getting out of chair: Reports Difficulty using hands for taps, buttons, cutlery, and/or writing: Reports  Review of Systems  Constitutional:  Positive for fatigue.  HENT:  Negative for mouth sores and mouth dryness.   Eyes:  Negative for dryness.  Respiratory:  Positive for shortness of breath.   Cardiovascular:  Positive for irregular heartbeat. Negative for chest pain and palpitations.  Gastrointestinal:  Negative for blood in stool, constipation and diarrhea.  Endocrine: Negative for increased urination.  Genitourinary:  Positive for involuntary urination. Negative for painful urination.  Musculoskeletal:  Positive for joint pain, gait problem, joint pain, joint swelling, myalgias, muscle weakness, morning stiffness, muscle tenderness and myalgias.  Skin:  Negative for color change, rash, hair loss and sensitivity to sunlight.  Allergic/Immunologic: Positive for susceptible to infections.  Neurological:  Positive for numbness and headaches. Negative for dizziness.  Hematological:  Negative for swollen glands.  Psychiatric/Behavioral:  Positive for depressed mood and sleep disturbance. The patient is nervous/anxious.     PMFS History:  Patient Active Problem List   Diagnosis Date Noted   Asthmatic bronchitis with acute exacerbation 04/26/2022   Moderate persistent asthma 11/27/2021   GERD (gastroesophageal reflux disease) 11/27/2021   Normocytic anemia 11/27/2021   Acute severe  exacerbation of moderate persistent allergic asthma 11/24/2021   Extensor tenosynovitis of wrist 04/30/2021   Carpal tunnel syndrome of left wrist 07/18/2020   Carpal tunnel syndrome of right wrist 07/18/2020   Chronic low back pain 12/26/2019   Anemia in pregnancy 01/23/2018   Morbid obesity with BMI of 45.0-49.9, adult (HCC) 10/19/2017    Seasonal allergic rhinitis 11/01/2016   Allergic asthma 06/30/2015   Vitamin D deficiency 01/22/2015   Anxiety and depression 01/21/2015    Past Medical History:  Diagnosis Date   Acid reflux 2004   Anemia    Anxiety 2008   Asthma 1993   no previous intubations or hospitalizations    Carpal tunnel syndrome    Chlamydia    Depression 2008   no meds, currenly ok   Eczema    Environmental allergies    GERD (gastroesophageal reflux disease)    Migraines    Miscarriage    Ovarian cyst    Pre-diabetes    Ulcer of the stomach and intestine 2004   Urinary tract infection     Family History  Problem Relation Age of Onset   Asthma Mother    Heart disease Mother    Hypertension Mother    Clotting disorder Mother    Diabetes Mother    Osteoarthritis Mother    Other Mother        DDD   High Cholesterol Mother    Anemia Mother    Healthy Brother    Asthma Maternal Aunt    Hypertension Maternal Aunt    Diabetes Maternal Aunt    Asthma Maternal Uncle    Hypertension Maternal Uncle    Diabetes Maternal Uncle    Diabetes Maternal Uncle    Heart disease Maternal Uncle    Diabetes Maternal Uncle    Cancer Paternal Aunt    Heart disease Maternal Grandmother    Diabetes Maternal Grandmother    Hypertension Maternal Grandmother    Heart disease Maternal Grandfather    Diabetes Maternal Grandfather    Hypertension Maternal Grandfather    Hypertension Paternal Grandmother    Hypertension Son    ADD / ADHD Son    Asthma Son    Allergies Son    Allergies Son    Asthma Son    GER disease Son    Allergies Daughter    Asthma Daughter    Migraines Daughter    Allergies Daughter    Migraines Daughter    Asthma Daughter    Hypotension Neg Hx    Anesthesia problems Neg Hx    Malignant hyperthermia Neg Hx    Pseudochol deficiency Neg Hx    Past Surgical History:  Procedure Laterality Date   CARPAL TUNNEL RELEASE Left    COLONOSCOPY  2004   IRRIGATION AND DEBRIDEMENT  SEBACEOUS CYST N/A 06/01/2017   Procedure: EXCISION SEBACEOUS CYST ON SCALP;  Surgeon: Ancil Linsey, MD;  Location: [facility] OR[facility];  Service: General;  Laterality: N/A;   MULTIPLE TOOTH EXTRACTIONS  2015   6 teeth removed    TUBAL LIGATION N/A 04/04/2018   Procedure: POST PARTUM TUBAL LIGATION;  Surgeon: Levie Heritage, DO;  Location: [facility];  Service: Gynecology;  Laterality: N/A;   TYMPANOSTOMY TUBE PLACEMENT     WISDOM TOOTH EXTRACTION  2007   Social History   Social History Narrative   Lives with 3 children.   Immediate family in [place].    Born in [place].   Raised in [place], [place].  Lives in a 2 story home.   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  Constitutional:      Appearance: She is well-developed.  HENT:     Head: Normocephalic and atraumatic.  Eyes:     Conjunctiva/sclera: Conjunctivae normal.  Cardiovascular:     Rate and Rhythm: Normal rate and regular rhythm.     Heart sounds: Normal heart sounds.  Pulmonary:     Effort: Pulmonary effort is normal.     Breath sounds: Normal breath sounds.  Abdominal:     General: Bowel sounds are normal.     Palpations: Abdomen is soft.  Musculoskeletal:     Cervical back: Normal range of motion.  Lymphadenopathy:     Cervical: No cervical adenopathy.  Skin:    General: Skin is warm and dry.     Capillary Refill: Capillary refill takes less than 2 seconds.  Neurological:     Mental Status: She is alert and oriented to person,  place, and time.  Psychiatric:        Behavior: Behavior normal.      Musculoskeletal Exam: She had good range of motion of the cervical spine with discomfort.  She had tenderness over the entire spine in the paravertebral region.  Shoulder joints were in good range of motion with some discomfort palpation of her left shoulder.  She had discomfort range of motion of her left elbow joint with no synovitis.  Wrist joints were in good range of motion with no synovitis warmth or swelling.  She had tenderness on palpation of her left wrist, left MCPs PIPs and DIPs but no synovitis was noted.  Right hand was in good range of motion without any synovitis.  Hip joints were in good range of motion without discomfort.  The joints were in good range of motion without any warmth swelling or effusion.  She had discomfort range of motion of her right knee joint.  There was no tenderness over ankles or MTPs.  CDAI Exam: CDAI Score: -- Patient Global: --; Provider Global: -- Swollen: --; Tender: -- Joint Exam 12/07/2022   No joint exam has been documented for this visit   There is currently no information documented on the homunculus. Go to the Rheumatology activity and complete the homunculus joint exam.  Investigation: No additional findings.  Imaging: XR Hand 2 View Left  Result Date: 12/07/2022 No CMC, PIP or DIP narrowing was noted.  No intercarpal or radiocarpal joint space narrowing was noted.  No erosive changes were noted. Impression: Unremarkable x-rays of the hand.  XR Hand 2 View Right  Result Date: 12/07/2022 No CMC, PIP or DIP narrowing was noted.  No intercarpal or radiocarpal joint space narrowing was noted.  No erosive changes were noted. Impression: Unremarkable x-rays of the hand.  DG Lumbar Spine Complete  Result Date: 11/20/2022 CLINICAL DATA:  Low back pain.  No known injury. EXAM: LUMBAR SPINE - COMPLETE 4+ VIEW COMPARISON:  12/14/2019. FINDINGS: There is no evidence of acute  lumbar spine fracture. Transitional vertebral body is present at S1. There is mild retrolisthesis at L5-S1. Intervertebral disc spaces are maintained. Facet arthropathy is noted in the lower lumbar spine. Mild sclerosis is noted at the sacroiliac joints bilaterally, suggesting sacroiliitis. Tubal ligation clips are noted in the pelvis. IMPRESSION: Facet arthropathy in the lower lumbar spine. Electronically Signed   By: Thornell Sartorius M.D.   On: 11/20/2022 21:20    Recent Labs: Lab Results  Component Value Date   WBC 12.5 (H) 04/11/2022   HGB 11.7 (L) 04/11/2022   PLT 348 04/11/2022   NA 138 04/11/2022   K 3.7 04/11/2022   CL 102 04/11/2022   CO2 25 04/11/2022   GLUCOSE 90 04/11/2022   BUN 9 04/11/2022   CREATININE 0.78 04/11/2022   BILITOT 0.4 03/06/2020   ALKPHOS 55 03/06/2020   AST 12 (L) 03/06/2020   ALT 13 03/06/2020   PROT 7.3 03/06/2020   ALBUMIN 3.5 03/06/2020   CALCIUM 8.8 (L) 04/11/2022   GFRAA >60 12/11/2019     FINDINGS: There is no evidence of acute lumbar spine fracture. Transitional vertebral body is present at S1. There is mild retrolisthesis at L5-S1. Intervertebral disc spaces are maintained. Facet arthropathy is noted in the lower lumbar spine. Mild sclerosis is noted at the sacroiliac joints bilaterally, suggesting sacroiliitis. Tubal ligation clips are noted in the pelvis.   IMPRESSION: Facet arthropathy in the lower lumbar spine.     Electronically Signed   By: Thornell Sartorius M.D.   On: 11/20/2022 21:20  Speciality Comments: No specialty comments available.  Procedures:  No procedures performed Allergies: Latex, Other, Zofran [ondansetron], and Tree extract   Assessment / Plan:     Visit Diagnoses: Pain in both hands -patient states she has pain and discomfort in the bilateral hands due to carpal tunnel syndrome.  She also gives history of episodic swelling and pain in her left hand.  She states she gets to the point that she cannot her hand fourth  several days.  The symptoms started after the left carpal tunnel release.  She states she had no improvement with the carpal tunnel release.  During the flares the pain extends from her hand all the way to her shoulder.  No synovitis was noted on the examination today.  Plan: XR Hand 2 View Right, XR Hand 2 View Left.  X-rays of bilateral hands were unremarkable.  A handout on hand exercises was given.  Pain in left wrist -patient has been followed by Dr. Melvyn Novas.  April 19, 2021 MRI of left wrist showed 7 x 6 mm ganglion dorsal of the scapholunate joint, mild fourth extensor compartment tenosynovitis, mild first and sixth extensor compartment tendinosis.  Read by: Lennie Muckle, MD  Ganglion cyst of dorsum of left wrist  Bilateral carpal tunnel syndrome - Status post left carpal tunnel release by Dr.Ortmann October 16, 2018.  Patient states that she also needs right carpal tunnel release by Dr. Melvyn Novas hesitant in doing the surgery because of her previous results of left carpal tunnel release.  Chronic pain of right knee-patient states she injured her right knee at work in April 2024.  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  She has had pain and discomfort in her knee joints since then.  She is followed at [facility] orthopedics.  She had a cortisone injection about a week ago without much response.  She is going to physical therapy.  No warmth swelling or effusion was noted.  Chronic midline low back pain without sciatica-she has been experiencing pain and discomfort in her lower back for several months now.  She states symptoms flared about 2 weeks ago and she has been using a walker for the last 2 weeks.  She had x-rays which showed facet joint arthropathy.  She was evaluated by neurosurgery and was advised physical therapy.  Patient continues to use walker due to pain and discomfort.  Other medical problems are listed as follows:  Vitamin D deficiency  History of asthma  History of  gastroesophageal reflux (GERD)  Anxiety and depression  Normocytic anemia  Morbid obesity with BMI of 45.0-49.9, adult (HCC)  History of migraine  Orders: Orders Placed This Encounter  Procedures   XR Hand 2 View Right   XR Hand 2 View Left   Sedimentation rate   Rheumatoid factor   Cyclic citrul peptide antibody, IgG   ANA   No orders of the defined types were placed in this encounter.    Follow-Up Instructions: Return for Polyarthralgia.   Pollyann Savoy, MD  Note - This record has been created using Animal nutritionist.  Chart creation errors have been sought, but may not always  have been located. Such creation errors do not reflect on  the standard of medical care.

## 2022-11-26 ENCOUNTER — Other Ambulatory Visit (HOSPITAL_COMMUNITY): Payer: Self-pay

## 2022-11-26 MED ORDER — OXYCODONE-ACETAMINOPHEN 5-325 MG PO TABS
1.0000 | ORAL_TABLET | Freq: Two times a day (BID) | ORAL | 0 refills | Status: DC | PRN
  Filled 2022-11-26: qty 20, 10d supply, fill #0

## 2022-12-07 ENCOUNTER — Ambulatory Visit: Payer: BLUE CROSS/BLUE SHIELD

## 2022-12-07 ENCOUNTER — Telehealth: Payer: Self-pay

## 2022-12-07 ENCOUNTER — Ambulatory Visit: Payer: BLUE CROSS/BLUE SHIELD | Attending: Rheumatology | Admitting: Rheumatology

## 2022-12-07 ENCOUNTER — Ambulatory Visit (INDEPENDENT_AMBULATORY_CARE_PROVIDER_SITE_OTHER): Payer: BLUE CROSS/BLUE SHIELD

## 2022-12-07 ENCOUNTER — Encounter: Payer: Self-pay | Admitting: Rheumatology

## 2022-12-07 VITALS — BP 122/81 | HR 86 | Resp 17 | Ht 62.0 in | Wt 277.8 lb

## 2022-12-07 DIAGNOSIS — M79641 Pain in right hand: Secondary | ICD-10-CM | POA: Diagnosis not present

## 2022-12-07 DIAGNOSIS — Z8719 Personal history of other diseases of the digestive system: Secondary | ICD-10-CM

## 2022-12-07 DIAGNOSIS — Z6841 Body Mass Index (BMI) 40.0 and over, adult: Secondary | ICD-10-CM

## 2022-12-07 DIAGNOSIS — M25532 Pain in left wrist: Secondary | ICD-10-CM

## 2022-12-07 DIAGNOSIS — M67432 Ganglion, left wrist: Secondary | ICD-10-CM

## 2022-12-07 DIAGNOSIS — M79642 Pain in left hand: Secondary | ICD-10-CM

## 2022-12-07 DIAGNOSIS — G5603 Carpal tunnel syndrome, bilateral upper limbs: Secondary | ICD-10-CM

## 2022-12-07 DIAGNOSIS — G8929 Other chronic pain: Secondary | ICD-10-CM

## 2022-12-07 DIAGNOSIS — E559 Vitamin D deficiency, unspecified: Secondary | ICD-10-CM

## 2022-12-07 DIAGNOSIS — F419 Anxiety disorder, unspecified: Secondary | ICD-10-CM

## 2022-12-07 DIAGNOSIS — M25561 Pain in right knee: Secondary | ICD-10-CM

## 2022-12-07 DIAGNOSIS — Z8669 Personal history of other diseases of the nervous system and sense organs: Secondary | ICD-10-CM

## 2022-12-07 DIAGNOSIS — M545 Low back pain, unspecified: Secondary | ICD-10-CM

## 2022-12-07 DIAGNOSIS — Z8709 Personal history of other diseases of the respiratory system: Secondary | ICD-10-CM

## 2022-12-07 DIAGNOSIS — F32A Depression, unspecified: Secondary | ICD-10-CM

## 2022-12-07 DIAGNOSIS — D649 Anemia, unspecified: Secondary | ICD-10-CM

## 2022-12-07 NOTE — Telephone Encounter (Signed)
Per Dr. Corliss Skains, if labs are normal, no follow up is needed. Thanks!

## 2022-12-07 NOTE — Patient Instructions (Signed)

## 2022-12-08 ENCOUNTER — Other Ambulatory Visit (HOSPITAL_COMMUNITY): Payer: Self-pay

## 2022-12-08 ENCOUNTER — Other Ambulatory Visit: Payer: Self-pay | Admitting: Internal Medicine

## 2022-12-08 LAB — ANA: Anti Nuclear Antibody (ANA): NEGATIVE

## 2022-12-08 LAB — SEDIMENTATION RATE: Sed Rate: 43 mm/h — ABNORMAL HIGH (ref 0–20)

## 2022-12-08 LAB — RHEUMATOID FACTOR: Rheumatoid fact SerPl-aCnc: <10 IU/mL (ref <14)

## 2022-12-08 MED ORDER — SUMATRIPTAN SUCCINATE 100 MG PO TABS
100.0000 mg | ORAL_TABLET | ORAL | 5 refills | Status: DC | PRN
  Filled 2022-12-08: qty 9, 30d supply, fill #0

## 2022-12-08 MED ORDER — VITAMIN D (ERGOCALCIFEROL) 50000 UNITS PO CAPS
50000.0000 [IU] | ORAL_CAPSULE | ORAL | 5 refills | Status: AC
  Filled 2022-12-08: qty 4, 28d supply, fill #0

## 2022-12-08 MED ORDER — PHENTERMINE HCL 37.5 MG PO TABS
37.5000 mg | ORAL_TABLET | Freq: Every morning | ORAL | 2 refills | Status: AC
  Filled 2022-12-08: qty 30, 30d supply, fill #0
  Filled 2023-04-27: qty 30, 30d supply, fill #1

## 2022-12-08 MED ORDER — CETIRIZINE HCL 10 MG PO TABS
10.0000 mg | ORAL_TABLET | Freq: Every evening | ORAL | 5 refills | Status: AC
  Filled 2022-12-08: qty 30, 30d supply, fill #0
  Filled 2023-09-22 – 2023-10-05 (×2): qty 30, 30d supply, fill #1

## 2022-12-08 MED ORDER — TOPIRAMATE 100 MG PO TABS
100.0000 mg | ORAL_TABLET | Freq: Every day | ORAL | 5 refills | Status: DC
  Filled 2022-12-08: qty 30, 30d supply, fill #0

## 2022-12-08 MED ORDER — MONTELUKAST SODIUM 10 MG PO TABS
10.0000 mg | ORAL_TABLET | Freq: Every day | ORAL | 5 refills | Status: DC
  Filled 2022-12-08: qty 30, 30d supply, fill #0

## 2022-12-08 MED ORDER — AMITRIPTYLINE HCL 25 MG PO TABS
25.0000 mg | ORAL_TABLET | Freq: Every evening | ORAL | 5 refills | Status: AC | PRN
  Filled 2022-12-08: qty 30, 30d supply, fill #0

## 2022-12-08 MED ORDER — NITROFURANTOIN MONOHYD MACRO 100 MG PO CAPS
100.0000 mg | ORAL_CAPSULE | Freq: Two times a day (BID) | ORAL | 0 refills | Status: DC
  Filled 2022-12-08: qty 14, 7d supply, fill #0

## 2022-12-09 NOTE — Progress Notes (Signed)
I will discuss results at the follow-up visit.

## 2022-12-10 LAB — CYCLIC CITRUL PEPTIDE ANTIBODY, IGG: Cyclic Citrullin Peptide Ab: <16 UNITS

## 2022-12-10 NOTE — Telephone Encounter (Signed)
Per Dr. Corliss Skains on lab note, I will discuss results at the follow-up visit.

## 2022-12-10 NOTE — Progress Notes (Signed)
Sed rate is elevated and stable over the last several years.  Rheumatoid factor and anti-CCP are negative.  Please schedule ultrasound of bilateral hands if possible.

## 2022-12-14 ENCOUNTER — Ambulatory Visit: Payer: BLUE CROSS/BLUE SHIELD

## 2022-12-14 ENCOUNTER — Encounter: Payer: Self-pay | Admitting: Rheumatology

## 2022-12-14 ENCOUNTER — Ambulatory Visit: Payer: BLUE CROSS/BLUE SHIELD | Attending: Rheumatology | Admitting: Rheumatology

## 2022-12-14 VITALS — BP 111/78 | HR 102 | Ht 62.0 in | Wt 274.0 lb

## 2022-12-14 DIAGNOSIS — F32A Depression, unspecified: Secondary | ICD-10-CM

## 2022-12-14 DIAGNOSIS — F419 Anxiety disorder, unspecified: Secondary | ICD-10-CM

## 2022-12-14 DIAGNOSIS — M79642 Pain in left hand: Secondary | ICD-10-CM | POA: Diagnosis not present

## 2022-12-14 DIAGNOSIS — M25532 Pain in left wrist: Secondary | ICD-10-CM | POA: Diagnosis not present

## 2022-12-14 DIAGNOSIS — M67432 Ganglion, left wrist: Secondary | ICD-10-CM

## 2022-12-14 DIAGNOSIS — Z8669 Personal history of other diseases of the nervous system and sense organs: Secondary | ICD-10-CM

## 2022-12-14 DIAGNOSIS — Z8709 Personal history of other diseases of the respiratory system: Secondary | ICD-10-CM

## 2022-12-14 DIAGNOSIS — G5603 Carpal tunnel syndrome, bilateral upper limbs: Secondary | ICD-10-CM | POA: Diagnosis not present

## 2022-12-14 DIAGNOSIS — M25561 Pain in right knee: Secondary | ICD-10-CM

## 2022-12-14 DIAGNOSIS — Z8719 Personal history of other diseases of the digestive system: Secondary | ICD-10-CM

## 2022-12-14 DIAGNOSIS — M79641 Pain in right hand: Secondary | ICD-10-CM

## 2022-12-14 DIAGNOSIS — G8929 Other chronic pain: Secondary | ICD-10-CM

## 2022-12-14 DIAGNOSIS — E559 Vitamin D deficiency, unspecified: Secondary | ICD-10-CM

## 2022-12-14 DIAGNOSIS — M545 Low back pain, unspecified: Secondary | ICD-10-CM

## 2022-12-14 DIAGNOSIS — D649 Anemia, unspecified: Secondary | ICD-10-CM

## 2022-12-14 NOTE — Progress Notes (Signed)
Office Visit Note  Patient: Tram Neisler             Date of Birth: [birth year]           MRN: [id number]             PCP: Fleet Contras, MD Referring: Fleet Contras, MD Visit Date: 12/14/2022 Occupation: @GUAROCC @  Subjective:  Pain in multiple joints  History of Present Illness: Aleyna Merriam is a 37 y.o. female returns for follow-up visit today.  According to the patient she continues to have severe pain in her bilateral hands.  She states she has burning and tingling pain in her both hands.  She will be having repeat left carpal tunnel release in the future.  She states she needs right elbow Charlize also in the future.  She states she has episodic increased pain and swelling in her hands.  She continues to have pain and discomfort in her right knee joint since the fall after work.  She has been having increased pain and discomfort in her lower back.  She has been ambulating with the help of her walker.    Activities of Daily Living:  Patient reports morning stiffness for various times.  Patient Reports nocturnal pain.  Difficulty dressing/grooming: Reports Difficulty climbing stairs: Denies Difficulty getting out of chair: Denies Difficulty using hands for taps, buttons, cutlery, and/or writing: Reports  Review of Systems  Constitutional:  Positive for fatigue.  HENT:  Negative for mouth sores and mouth dryness.   Eyes:  Negative for dryness.  Respiratory:  Negative for shortness of breath.   Cardiovascular:  Negative for chest pain and palpitations.  Gastrointestinal:  Negative for blood in stool, constipation and diarrhea.  Endocrine: Positive for increased urination.  Genitourinary:  Positive for involuntary urination.  Musculoskeletal:  Positive for joint pain, gait problem, joint pain, joint swelling and morning stiffness. Negative for myalgias, muscle weakness, muscle tenderness and myalgias.  Skin:  Negative for color change, rash, hair loss and sensitivity to  sunlight.  Allergic/Immunologic: Negative for susceptible to infections.  Neurological:  Positive for numbness and parasthesias. Negative for dizziness and headaches.  Hematological:  Negative for swollen glands.  Psychiatric/Behavioral:  Positive for depressed mood and sleep disturbance. The patient is nervous/anxious.     PMFS History:  Patient Active Problem List   Diagnosis Date Noted   Asthmatic bronchitis with acute exacerbation 04/26/2022   Moderate persistent asthma 11/27/2021   GERD (gastroesophageal reflux disease) 11/27/2021   Normocytic anemia 11/27/2021   Acute severe exacerbation of moderate persistent allergic asthma 11/24/2021   Extensor tenosynovitis of wrist 04/30/2021   Carpal tunnel syndrome of left wrist 07/18/2020   Carpal tunnel syndrome of right wrist 07/18/2020   Chronic low back pain 12/26/2019   Anemia in pregnancy 01/23/2018   Morbid obesity with BMI of 45.0-49.9, adult (HCC) 10/19/2017   Seasonal allergic rhinitis 11/01/2016   Allergic asthma 06/30/2015   Vitamin D deficiency 01/22/2015   Anxiety and depression 01/21/2015    Past Medical History:  Diagnosis Date   Acid reflux 2004   Anemia    Anxiety 2008   Asthma 1993   no previous intubations or hospitalizations    Carpal tunnel syndrome    Chlamydia    Depression 2008   no meds, currenly ok   Eczema    Environmental allergies    GERD (gastroesophageal reflux disease)    Migraines    Miscarriage    Ovarian cyst    Pre-diabetes  Ulcer of the stomach and intestine 2004   Urinary tract infection     Family History  Problem Relation Age of Onset   Asthma Mother    Heart disease Mother    Hypertension Mother    Clotting disorder Mother    Diabetes Mother    Osteoarthritis Mother    Other Mother        DDD   High Cholesterol Mother    Anemia Mother    Healthy Brother    Asthma Maternal Aunt    Hypertension Maternal Aunt    Diabetes Maternal Aunt    Asthma Maternal Uncle     Hypertension Maternal Uncle    Diabetes Maternal Uncle    Diabetes Maternal Uncle    Heart disease Maternal Uncle    Diabetes Maternal Uncle    Cancer Paternal Aunt    Heart disease Maternal Grandmother    Diabetes Maternal Grandmother    Hypertension Maternal Grandmother    Heart disease Maternal Grandfather    Diabetes Maternal Grandfather    Hypertension Maternal Grandfather    Hypertension Paternal Grandmother    Hypertension Son    ADD / ADHD Son    Asthma Son    Allergies Son    Allergies Son    Asthma Son    GER disease Son    Allergies Daughter    Asthma Daughter    Migraines Daughter    Allergies Daughter    Migraines Daughter    Asthma Daughter    Hypotension Neg Hx    Anesthesia problems Neg Hx    Malignant hyperthermia Neg Hx    Pseudochol deficiency Neg Hx    Past Surgical History:  Procedure Laterality Date   CARPAL TUNNEL RELEASE Left    COLONOSCOPY  2004   IRRIGATION AND DEBRIDEMENT SEBACEOUS CYST N/A 06/01/2017   Procedure: EXCISION SEBACEOUS CYST ON SCALP;  Surgeon: Ancil Linsey, MD;  Location: [facility];  Service: General;  Laterality: N/A;   MULTIPLE TOOTH EXTRACTIONS  2015   6 teeth removed    TUBAL LIGATION N/A 04/04/2018   Procedure: POST PARTUM TUBAL LIGATION;  Surgeon: Levie Heritage, DO;  Location: [facility];  Service: Gynecology;  Laterality: N/A;   TYMPANOSTOMY TUBE PLACEMENT     WISDOM TOOTH EXTRACTION  2007   Social History   Social History Narrative   Lives with 3 children.   Immediate family in [place].    Born in [place].   Raised in [place], [place].    Lives in a 2 story home.   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  Constitutional:      Appearance: She is well-developed.  HENT:     Head: Normocephalic and atraumatic.  Eyes:     Conjunctiva/sclera: Conjunctivae normal.  Cardiovascular:     Rate and Rhythm: Normal rate and regular rhythm.     Heart sounds: Normal heart sounds.  Pulmonary:     Effort: Pulmonary effort  is normal.     Breath sounds: Normal breath sounds.  Abdominal:     General: Bowel sounds are normal.     Palpations: Abdomen is soft.  Musculoskeletal:     Cervical back: Normal range of motion.  Lymphadenopathy:     Cervical: No cervical adenopathy.  Skin:    General: Skin is warm and dry.     Capillary Refill: Capillary refill takes less than 2 seconds.  Neurological:     Mental Status: She is alert and oriented to person, place, and time.  Psychiatric:        Behavior: Behavior normal.      Musculoskeletal Exam: Cervical spine was in good range of motion.  Thoracic and lumbar spine were difficult to assess in the seated position.  Shoulder joints and elbow joints were in good range of motion.  No synovitis was noted.  She had good range of motion of bilateral wrist joints with no synovitis.  She had tenderness over bilateral MCPs, PIPs and DIPs but no synovitis was noted.  She was extremely tender to superficial touch.  Hip joints could not be assessed in the seated position.  Knee joints were in good range of motion noted any warmth swelling or effusion.  She had discomfort range of motion of her right knee joint.  There was no tenderness over ankles or MTPs.  CDAI Exam: CDAI Score: -- Patient Global: --; Provider Global: -- Swollen: --; Tender: -- Joint Exam 12/14/2022   No joint exam has been documented for this visit   There is currently no information documented on the  homunculus. Go to the Rheumatology activity and complete the homunculus joint exam.  Investigation: No additional findings.  Imaging: [place] LIMITED JOINT SPACE STRUCTURES UP BILAT  Result Date: 12/14/2022 Ultrasound examination of bilateral hands was performed per EULAR recommendations. Using 15 MHz transducer, grayscale and power Doppler bilateral second and third MCP joints  both dorsal and volar aspects were evaluated to look for synovitis or tenosynovitis. The findings were there was no synovitis or tenosynovitis on ultrasound examination Impression: No synovitis or tenosynovitis was noted on the ultrasound examination.   XR Hand 2 View Left  Result Date: 12/07/2022 No CMC, PIP or DIP narrowing was noted.  No intercarpal or radiocarpal joint space narrowing was noted.  No erosive changes were noted. Impression: Unremarkable x-rays of the hand.  XR Hand 2 View Right  Result Date: 12/07/2022 No CMC, PIP or DIP narrowing was noted.  No intercarpal or radiocarpal joint space narrowing was noted.  No erosive changes were noted. Impression: Unremarkable x-rays of the hand.  DG Lumbar Spine Complete  Result Date: 11/20/2022 CLINICAL DATA:  Low back pain.  No known injury. EXAM: LUMBAR SPINE - COMPLETE 4+ VIEW COMPARISON:  12/14/2019. FINDINGS: There is no evidence of acute lumbar spine fracture. Transitional vertebral body is present at S1. There is mild retrolisthesis at L5-S1. Intervertebral disc spaces are maintained. Facet arthropathy is noted in the lower lumbar spine. Mild sclerosis is noted at the sacroiliac joints bilaterally, suggesting sacroiliitis. Tubal ligation clips are noted in the pelvis. IMPRESSION: Facet arthropathy in the lower lumbar spine. Electronically Signed   By: Thornell Sartorius M.D.   On: 11/20/2022 21:20    Recent Labs: Lab Results  Component Value Date   WBC 10.5 12/08/2022   HGB 11.3 (L) 12/08/2022   PLT 365 12/08/2022   NA 137 12/08/2022   K 4.1 12/08/2022   CL 102  12/08/2022  CO2 26 12/08/2022   GLUCOSE 95 12/08/2022   BUN 12 12/08/2022   CREATININE 0.69 12/08/2022   BILITOT 0.4 12/08/2022   ALKPHOS 55 03/06/2020   AST 9 (L) 12/08/2022   ALT 10 12/08/2022   PROT 6.7 12/08/2022   ALBUMIN 3.5 03/06/2020   CALCIUM 9.2 12/08/2022   GFRAA >60 12/11/2019   December 07, 2022 ESR 43, RF negative, anti-CCP negative, ANA -  December 08, 2022 vitamin D 15, TSH normal  Speciality Comments: No specialty comments available.  Procedures:  No procedures performed Allergies: Latex, Other, Zofran [ondansetron], and Tree extract   Assessment / Plan:     Visit Diagnoses: Pain in both hands -patient complains of severe pain and discomfort in her bilateral hands.  She is tender to touch.  No synovitis was noted on the examination.  Patient gives history of episodic swelling.  Labs obtained on December 07, 2022 showed getting ANA, negative rheumatoid factor and negative anti-CCP antibodies.  Sed rate was mildly elevated at 43 most likely due to anemia and high BMI.  I will obtain ultrasound of bilateral hands to look for synovitis.  Ultrasound of bilateral hands was negative for synovitis.  Plan: [place] LIMITED JOINT SPACE STRUCTURES UP BILAT.  Lab results and ultrasound results were discussed with patient at length.  Patient voiced understanding.  Pain in left wrist-patient had MRI of her left wrist joint which showed ganglion cyst and mild tenosynovitis.  Patient will follow-up with Dr. Melvyn Novas.  Ganglion cyst of dorsum of left wrist  Bilateral carpal tunnel syndrome - s/p left CTR.  Patient states she did not have good response to carpal tunnel release.  She will have repeat surgery.  She also plans to have right carpal tunnel release in the future.  Chronic pain of right knee-she continues to have pain and discomfort in her right knee joint since the fall in April 2024.  No warmth swelling or effusion was noted on the examination.  She is followed by [facility]  orthopedics.  Chronic midline low back pain without sciatica-she has been having lower back pain and has been using a walker.  X-rays in the past showed facet joint arthropathy.  She had neurosurgical evaluation.  Other medical problems are listed as follows:  History of gastroesophageal reflux (GERD)  Vitamin D deficiency-she vitamin D deficiency.  Vitamin D level was 15 on December 08, 2022.  Patient states she has a prescription for vitamin D which she will pick up.  Normocytic anemia-She has chronic anemia with hemoglobin 11.3.  Anemia could be contributing to elevated sedimentation rate.  Anxiety and depression  History of migraine  History of asthma  Orders: Orders Placed This Encounter  Procedures   [place] LIMITED JOINT SPACE STRUCTURES UP BILAT   No orders of the defined types were placed in this encounter.  .  Follow-Up Instructions: Return if symptoms worsen or fail to improve, for Pain in joints.   Pollyann Savoy, MD  Note - This record has been created using Animal nutritionist.  Chart creation errors have been sought, but may not always  have been located. Such creation errors do not reflect on  the standard of medical care.

## 2022-12-22 ENCOUNTER — Other Ambulatory Visit (HOSPITAL_COMMUNITY): Payer: Self-pay

## 2022-12-22 MED ORDER — OXYCODONE-ACETAMINOPHEN 5-325 MG PO TABS
1.0000 | ORAL_TABLET | Freq: Two times a day (BID) | ORAL | 0 refills | Status: DC | PRN
Start: 2022-12-22 — End: 2023-07-05
  Filled 2022-12-22: qty 20, 10d supply, fill #0

## 2022-12-28 ENCOUNTER — Ambulatory Visit: Payer: Medicaid Other | Admitting: Rheumatology

## 2023-01-18 ENCOUNTER — Other Ambulatory Visit (HOSPITAL_COMMUNITY): Payer: Self-pay

## 2023-01-18 MED ORDER — OXYCODONE-ACETAMINOPHEN 5-325 MG PO TABS
1.0000 | ORAL_TABLET | Freq: Two times a day (BID) | ORAL | 0 refills | Status: AC | PRN
Start: 2023-01-17 — End: 2023-01-28
  Filled 2023-01-18: qty 20, 10d supply, fill #0

## 2023-01-21 ENCOUNTER — Other Ambulatory Visit (HOSPITAL_COMMUNITY): Payer: Self-pay

## 2023-01-21 MED ORDER — BUDESONIDE-FORMOTEROL FUMARATE 160-4.5 MCG/ACT IN AERO
2.0000 | INHALATION_SPRAY | Freq: Two times a day (BID) | RESPIRATORY_TRACT | 5 refills | Status: DC
  Filled 2023-01-21: qty 10.2, 30d supply, fill #0

## 2023-01-28 ENCOUNTER — Other Ambulatory Visit: Payer: Self-pay | Admitting: Internal Medicine

## 2023-01-28 ENCOUNTER — Telehealth: Payer: Self-pay | Admitting: Internal Medicine

## 2023-01-28 ENCOUNTER — Other Ambulatory Visit (HOSPITAL_COMMUNITY): Payer: Self-pay

## 2023-01-28 MED ORDER — ALBUTEROL SULFATE HFA 108 (90 BASE) MCG/ACT IN AERS
2.0000 | INHALATION_SPRAY | Freq: Four times a day (QID) | RESPIRATORY_TRACT | 5 refills | Status: AC | PRN
  Filled 2023-01-28: qty 18, 25d supply, fill #0
  Filled 2023-10-11: qty 18, 25d supply, fill #1
  Filled 2024-01-07: qty 18, 25d supply, fill #2

## 2023-01-28 MED ORDER — PREDNISONE 20 MG PO TABS
40.0000 mg | ORAL_TABLET | Freq: Every day | ORAL | 0 refills | Status: DC
  Filled 2023-01-28: qty 10, 5d supply, fill #0

## 2023-01-28 NOTE — Telephone Encounter (Signed)
Prednisone sent to pharmacy

## 2023-01-28 NOTE — Telephone Encounter (Signed)
Poss asthma flair. No appts. Pls call to advise. [phone]  Pharm: [facility] on[address]

## 2023-01-28 NOTE — Telephone Encounter (Signed)
Patient checking on sick message. States pharmacy closes at 6:00 pm. Patient phone number is [phone].

## 2023-01-28 NOTE — Telephone Encounter (Signed)
Called and spoke with pt, she states she has a cough, sob and has been using inhalers more. Advair and albuterol. States her symptoms started last week. Has been using her allergy medications as well.  Dr.desai please advise

## 2023-01-29 MED ORDER — FLUTICASONE-SALMETEROL 500-50 MCG/ACT IN AEPB
1.0000 | INHALATION_SPRAY | Freq: Two times a day (BID) | RESPIRATORY_TRACT | 0 refills | Status: AC
  Filled 2023-01-29 – 2023-02-07 (×2): qty 60, 30d supply, fill #0

## 2023-01-30 ENCOUNTER — Other Ambulatory Visit (HOSPITAL_COMMUNITY): Payer: Self-pay

## 2023-01-31 ENCOUNTER — Encounter: Payer: Self-pay | Admitting: *Deleted

## 2023-01-31 ENCOUNTER — Other Ambulatory Visit: Payer: Self-pay

## 2023-01-31 ENCOUNTER — Other Ambulatory Visit (HOSPITAL_COMMUNITY): Payer: Self-pay

## 2023-01-31 NOTE — Telephone Encounter (Signed)
RX sent in. NFN

## 2023-01-31 NOTE — Telephone Encounter (Signed)
Medication ready at [facility] pharmacy.  Patient has not picked it up.  Unable to reach patient by phone.  Sent patient mychart message to let her know it is still ready.

## 2023-02-02 ENCOUNTER — Other Ambulatory Visit (HOSPITAL_COMMUNITY): Payer: Self-pay

## 2023-02-02 MED ORDER — PREDNISONE 20 MG PO TABS
ORAL_TABLET | ORAL | 0 refills | Status: AC
  Filled 2023-02-02: qty 10, 7d supply, fill #0

## 2023-02-02 MED ORDER — AZITHROMYCIN 250 MG PO TABS
ORAL_TABLET | ORAL | 0 refills | Status: DC
  Filled 2023-02-02: qty 6, 5d supply, fill #0

## 2023-02-07 ENCOUNTER — Other Ambulatory Visit (HOSPITAL_COMMUNITY): Payer: Self-pay

## 2023-02-18 ENCOUNTER — Other Ambulatory Visit (HOSPITAL_COMMUNITY): Payer: Self-pay

## 2023-02-18 MED ORDER — OXYCODONE-ACETAMINOPHEN 5-325 MG PO TABS
1.0000 | ORAL_TABLET | Freq: Two times a day (BID) | ORAL | 0 refills | Status: AC | PRN
Start: 2023-02-18 — End: 2023-02-28
  Filled 2023-02-18: qty 20, 10d supply, fill #0

## 2023-03-14 ENCOUNTER — Other Ambulatory Visit (HOSPITAL_COMMUNITY): Payer: Self-pay

## 2023-03-14 MED ORDER — OXYCODONE-ACETAMINOPHEN 5-325 MG PO TABS
1.0000 | ORAL_TABLET | Freq: Two times a day (BID) | ORAL | 0 refills | Status: DC
Start: 2023-03-14 — End: 2023-07-05
  Filled 2023-03-14: qty 20, 10d supply, fill #0

## 2023-03-18 ENCOUNTER — Ambulatory Visit (HOSPITAL_COMMUNITY)
Admission: EM | Admit: 2023-03-18 | Discharge: 2023-03-18 | Disposition: A | Discharge status: Home / Self Care | Payer: BLUE CROSS/BLUE SHIELD | Attending: Emergency Medicine | Admitting: Emergency Medicine

## 2023-03-18 ENCOUNTER — Encounter (HOSPITAL_COMMUNITY): Payer: Self-pay

## 2023-03-18 DIAGNOSIS — Z23 Encounter for immunization: Secondary | ICD-10-CM

## 2023-03-18 DIAGNOSIS — S80811A Abrasion, right lower leg, initial encounter: Secondary | ICD-10-CM | POA: Diagnosis not present

## 2023-03-18 MED ORDER — TETANUS-DIPHTH-ACELL PERTUSSIS 5-2.5-18.5 LF-MCG/0.5 IM SUSY
0.5000 mL | PREFILLED_SYRINGE | Freq: Once | INTRAMUSCULAR | Status: AC
  Administered 2023-03-18: 0.5 mL via INTRAMUSCULAR

## 2023-03-18 MED ORDER — TETANUS-DIPHTH-ACELL PERTUSSIS 5-2.5-18.5 LF-MCG/0.5 IM SUSY
PREFILLED_SYRINGE | INTRAMUSCULAR | Status: AC
  Filled 2023-03-18: qty 0.5

## 2023-03-18 NOTE — ED Triage Notes (Signed)
Patient's right leg was cut above the ankle on a metal shelf at [employer]. Onset yesterday, states it was bleeding pretty bad. States no longer bleeding.  Patient unsure of when her last TDAP was.

## 2023-03-18 NOTE — Discharge Instructions (Signed)
We have updated your Tdap today in clinic.  Please keep your wound clean and dry.  You can clean it with warm water and antibacterial solution like Hibiclens or Dial soap.  Afterwards you can apply an over-the-counter antibacterial ointment such as Neosporin.  Please keep it covered throughout the day to help prevent contamination.  Return to clinic if you develop fever, leg swelling, drainage or any new concerning symptoms.

## 2023-03-18 NOTE — ED Provider Notes (Signed)
[facility]    CSN: [id number] Arrival date & time: 03/18/23  1645      History   Chief Complaint Chief Complaint  Patient presents with   Leg Injury    HPI Laurie Reed is a 37 y.o. female.   Patient presents to clinic for an abrasion to her right lower leg that she sustained yesterday around noon at [employer].  Staff was getting something for her when she backed up into a dirty, rusty metal piece.  Reports she did not noticed that she was cut until she noticed the blood leaking down onto her shoe.  The area bled profusely.  Afterwards she cleaned with warm water and Hibiclens.  She is unsure when her last Tdap has been updated. Wound is itchy.   The history is provided by the patient and medical records.    Past Medical History:  Diagnosis Date   Acid reflux 2004   Anemia    Anxiety 2008   Asthma 1993   no previous intubations or hospitalizations    Carpal tunnel syndrome    Chlamydia    Depression 2008   no meds, currenly ok   Eczema    Environmental allergies    GERD (gastroesophageal reflux disease)    Migraines    Miscarriage    Ovarian cyst    Pre-diabetes    Ulcer of the stomach and intestine 2004   Urinary tract infection     Patient Active Problem List   Diagnosis Date Noted   Asthmatic bronchitis with acute exacerbation 04/26/2022   Moderate persistent asthma 11/27/2021   GERD (gastroesophageal reflux disease) 11/27/2021   Normocytic anemia 11/27/2021   Acute severe exacerbation of moderate persistent allergic asthma 11/24/2021   Extensor tenosynovitis of wrist 04/30/2021   Carpal tunnel syndrome of left wrist 07/18/2020   Carpal tunnel syndrome of right wrist 07/18/2020   Chronic low back pain 12/26/2019   Anemia in pregnancy 01/23/2018   Morbid obesity with BMI of 45.0-49.9, adult (HCC) 10/19/2017   Seasonal allergic rhinitis 11/01/2016   Allergic asthma 06/30/2015   Vitamin D deficiency 01/22/2015   Anxiety and depression  01/21/2015    Past Surgical History:  Procedure Laterality Date   CARPAL TUNNEL RELEASE Left    COLONOSCOPY  2004   IRRIGATION AND DEBRIDEMENT SEBACEOUS CYST N/A 06/01/2017   Procedure: EXCISION SEBACEOUS CYST ON SCALP;  Surgeon: Ancil Linsey, MD;  Location: [facility];  Service: General;  Laterality: N/A;   MULTIPLE TOOTH EXTRACTIONS  2015   6 teeth removed    TUBAL LIGATION N/A 04/04/2018   Procedure: POST PARTUM TUBAL LIGATION;  Surgeon: Levie Heritage, DO;  Location: [facility];  Service: Gynecology;  Laterality: N/A;   TYMPANOSTOMY TUBE PLACEMENT     WISDOM TOOTH EXTRACTION  2007    OB History     Gravida  7   Para  4   Term  4   Preterm  0   AB  3   Living  4      SAB  3   IAB  0   Ectopic  0   Multiple  0   Live Births  4            Home Medications    Prior to Admission medications   Medication Sig Start Date End Date Taking? Authorizing Provider  acetaminophen (TYLENOL) 500 MG tablet Take 500-1,000 mg by mouth every 6 (six) hours as needed for mild pain, moderate pain  or headache (or migraines).    Yes [provider]  albuterol (PROVENTIL) (2.5 MG/3ML) 0.083% nebulizer solution Use 1 vial via nebulizer every 6 hours and as needed Patient taking differently: Take 2.5 mg by nebulization every 6 (six) hours. Shortness of breath 05/15/21  Yes   albuterol (VENTOLIN HFA) 108 (90 Base) MCG/ACT inhaler Inhale 2 puffs into the lungs 4 (four) times daily as needed. 12/09/21  Yes   albuterol (VENTOLIN HFA) 108 (90 Base) MCG/ACT inhaler Inhale 2 puffs into the lungs 4 (four) times daily as needed. 01/28/23  Yes   amitriptyline (ELAVIL) 25 MG tablet Take 1 tablet (25 mg total) by mouth at bedtime as needed. 12/09/21  Yes   amitriptyline (ELAVIL) 25 MG tablet Take 1 tablet (25 mg total) by mouth at bedtime as needed. 12/08/22  Yes   azithromycin (ZITHROMAX Z-PAK) 250 MG tablet Take 2 tablets on day 1, then 1 tablet daily for 4 days 02/02/23   Yes   budesonide-formoterol (SYMBICORT) 160-4.5 MCG/ACT inhaler Inhale 2 puffs into the lungs 2 (two) times daily. 01/21/23  Yes   calcium carbonate (TUMS - DOSED IN MG ELEMENTAL CALCIUM) 500 MG chewable tablet Chew 1-3 tablets by mouth daily as needed for indigestion or heartburn.   Yes [provider]  cetirizine (ZYRTEC) 10 MG tablet Take 1 tablet (10 mg total) by mouth every evening as needed for allergies. 12/08/22  Yes   Chlorphen-PE-Acetaminophen (NOREL AD) 4-10-325 MG TABS Take 1 tablet by mouth 2 (two) times daily as needed for cough and congestion 01/25/22  Yes   cromolyn (OPTICROM) 4 % ophthalmic solution Place 1 drop into both eyes 4 (four) times daily as needed  for itchy/watery eyes. 06/16/22  Yes Padgett, Pilar Grammes, MD  diphenhydrAMINE (BENADRYL) 25 mg capsule Take 25-50 mg by mouth 2 (two) times daily as needed for allergies.   Yes [provider]  ferrous sulfate 324 (65 Fe) MG TBEC 1 pill daily after meal to help with anemia 11/22/18  Yes [name], [name], MD  fluticasone (FLONASE ALLERGY RELIEF) 50 MCG/ACT nasal spray Place 2 sprays into both nostrils daily. 12/09/21  Yes   fluticasone-salmeterol (ADVAIR DISKUS) 500-50 MCG/ACT AEPB Inhale 1 puff into the lungs in the morning and at bedtime. 01/29/23  Yes Charlott Holler, MD  gabapentin (NEURONTIN) 100 MG capsule Take 1 capsule (100 mg total) by mouth 3 (three) times daily. 04/21/22  Yes   hydrocortisone 2.5 % cream Apply 2 times daily as needed for bumps 05/15/21  Yes   ibuprofen (ADVIL) 800 MG tablet Take 1 tablet (800 mg total) by mouth every 6 (six) hours as needed for pain 02/10/22  Yes   levocetirizine (XYZAL) 5 MG tablet Take 1 tablet (5 mg total) by mouth every evening. 06/16/22  Yes Padgett, Pilar Grammes, MD  lidocaine (LIDODERM) 5 % Place 1 patch onto the skin daily. Remove & Discard patch within 12 hours or as directed by MD 11/20/22  Yes Curatolo, Adam, DO  meloxicam (MOBIC) 15 MG tablet Take 1 tablet (15 mg  total) by mouth daily with meal 06/10/22  Yes   meloxicam (MOBIC) 15 MG tablet Take 1 tablet (15 mg total) by mouth daily. Patient taking differently: Take 15 mg by mouth as needed. 09/14/22  Yes   methylPREDNISolone (MEDROL DOSEPAK) 4 MG TBPK tablet Follow package insert 11/20/22  Yes Curatolo, Adam, DO  montelukast (SINGULAIR) 10 MG tablet Take 1 tablet (10 mg total) by mouth daily. 12/09/21  Yes  montelukast (SINGULAIR) 10 MG tablet Take 1 tablet (10 mg total) by mouth at bedtime. 06/16/22  Yes Padgett, Pilar Grammes, MD  montelukast (SINGULAIR) 10 MG tablet Take 1 tablet (10 mg total) by mouth daily. 12/08/22  Yes   nitrofurantoin, macrocrystal-monohydrate, (MACROBID) 100 MG capsule Take 1 capsule (100 mg total) by mouth 2 (two) times daily. 12/08/22  Yes   oxyCODONE-acetaminophen (PERCOCET/ROXICET) 5-325 MG tablet Take 1 tablet by mouth 2 (two) times daily as needed for 10 days. 11/25/22  Yes   oxyCODONE-acetaminophen (PERCOCET/ROXICET) 5-325 MG tablet Take 1 tablet (5-325 mg total) by mouth 2 (two) times daily as needed for 10 days. 12/22/22  Yes   oxyCODONE-acetaminophen (PERCOCET/ROXICET) 5-325 MG tablet Take 1 tablet by mouth 2 (two) times daily as needed for 10 days 03/14/23  Yes   phentermine (ADIPEX-P) 37.5 MG tablet Take 1 tablet (37.5 mg total) by mouth in the morning. 10/14/21  Yes   phentermine (ADIPEX-P) 37.5 MG tablet Take 1 tablet (37.5 mg total) by mouth in the morning. 12/08/22  Yes   predniSONE (DELTASONE) 20 MG tablet Take 2 tablets (40 mg total) by mouth daily with breakfast. 01/28/23  Yes Charlott Holler, MD  RYALTRIS [phone] MCG/ACT SUSP 2 sprays per nostril 1-2 times daily. 06/16/22  Yes Padgett, Pilar Grammes, MD  SUMAtriptan (IMITREX) 100 MG tablet Take 1 tablet (100 mg total) by mouth as needed at onset of migraine symptoms. May repeat in 1-2 hours if persistent. 12/09/21  Yes   SUMAtriptan (IMITREX) 100 MG tablet Take 1 tablet (100 mg total) by mouth at onset of migraine  symptoms. May repeat in 1-2 hours if persistent. (Max 200 mg per 24 hours) 12/08/22  Yes   tiZANidine (ZANAFLEX) 4 MG tablet Take 1 tablet (4 mg total) by mouth at bedtime. 11/20/22  Yes Curatolo, Adam, DO  topiramate (TOPAMAX) 100 MG tablet Take 1 tablet (100 mg total) by mouth daily. 12/09/21  Yes   topiramate (TOPAMAX) 100 MG tablet Take 1 tablet (100 mg total) by mouth daily. 12/08/22  Yes   triamcinolone cream (KENALOG) 0.1 % Apply 1 Application topically 2 (two) times daily as needed. 11/17/22  Yes   VENTOLIN HFA 108 (90 Base) MCG/ACT inhaler Inhale 2 puffs into the lungs every 4 (four) hours as needed for wheezing or shortness of breath. 06/16/22  Yes Padgett, Pilar Grammes, MD  Vitamin D, Ergocalciferol, (DRISDOL) 1.25 MG (50000 UNIT) CAPS capsule Take 1 capsule (50,000 Units total) by mouth once a week. 07/21/21  Yes   Vitamin D, Ergocalciferol, 50000 units CAPS Take 1 capsule (50,000 Units) by mouth once a week. 12/08/22  Yes   omeprazole (PRILOSEC) 40 MG capsule Take 1 capsule (40 mg total) by mouth daily. 12/24/21 12/24/22  Charlott Holler, MD  dicyclomine (BENTYL) 10 MG capsule Take 1 capsule (10 mg total) by mouth 4 (four) times daily -  before meals and at bedtime. For stomach cramping Patient not taking: Reported on 10/25/2019 06/13/19 10/25/19  [name], Hewitt Shorts, MD  sucralfate (CARAFATE) 1 GM/10ML suspension Take 10 mLs (1 g total) by mouth 4 (four) times daily -  with meals and at bedtime. Patient not taking: Reported on 10/25/2019 06/13/19 10/25/19  Cain Saupe, MD    Family History Family History  Problem Relation Age of Onset   Asthma Mother    Heart disease Mother    Hypertension Mother    Clotting disorder Mother    Diabetes Mother    Osteoarthritis Mother    Other  Mother        DDD   High Cholesterol Mother    Anemia Mother    Healthy Brother    Asthma Maternal Aunt    Hypertension Maternal Aunt    Diabetes Maternal Aunt    Asthma Maternal Uncle    Hypertension Maternal Uncle     Diabetes Maternal Uncle    Diabetes Maternal Uncle    Heart disease Maternal Uncle    Diabetes Maternal Uncle    Cancer Paternal Aunt    Heart disease Maternal Grandmother    Diabetes Maternal Grandmother    Hypertension Maternal Grandmother    Heart disease Maternal Grandfather    Diabetes Maternal Grandfather    Hypertension Maternal Grandfather    Hypertension Paternal Grandmother    Hypertension Son    ADD / ADHD Son    Asthma Son    Allergies Son    Allergies Son    Asthma Son    GER disease Son    Allergies Daughter    Asthma Daughter    Migraines Daughter    Allergies Daughter    Migraines Daughter    Asthma Daughter    Hypotension Neg Hx    Anesthesia problems Neg Hx    Malignant hyperthermia Neg Hx    Pseudochol deficiency Neg Hx     Social History Social History   Tobacco Use   Smoking status: Never    Passive exposure: Never   Smokeless tobacco: Never  Vaping Use   Vaping status: Never Used  Substance Use Topics   Alcohol use: No    Alcohol/week: 0.0 standard drinks of alcohol   Drug use: No     Allergies   Latex, Other, Zofran [ondansetron], and Tree extract   Review of Systems Review of Systems  Per HPI   Physical Exam Triage Vital Signs ED Triage Vitals  Encounter Vitals Group     BP 03/18/23 1755 127/84     Systolic BP Percentile --      Diastolic BP Percentile --      Pulse Rate 03/18/23 1755 90     Resp 03/18/23 1755 16     Temp 03/18/23 1755 97.9 F (36.6 C)     Temp Source 03/18/23 1755 Oral     SpO2 03/18/23 1755 98 %     Weight 03/18/23 1755 274 lb 0.5 oz (124.3 kg)     Height 03/18/23 1755 5\' 2"  (1.575 m)     Head Circumference --      Peak Flow --      Pain Score 03/18/23 1753 5     Pain Loc --      Pain Education --      Exclude from Growth Chart --    No data found.  Updated Vital Signs BP 127/84 (BP Location: Right Arm)   Pulse 90   Temp 97.9 F (36.6 C) (Oral)   Resp 16   Ht 5\' 2"  (1.575 m)   Wt 274 lb  0.5 oz (124.3 kg)   SpO2 98%   BMI 50.12 kg/m   Visual Acuity Right Eye Distance:   Left Eye Distance:   Bilateral Distance:    Right Eye Near:   Left Eye Near:    Bilateral Near:     Physical Exam Vitals and nursing note reviewed.  Constitutional:      Appearance: Normal appearance.  HENT:     Head: Normocephalic and atraumatic.     Right Ear: External ear normal.  Left Ear: External ear normal.     Nose: Nose normal.     Mouth/Throat:     Mouth: Mucous membranes are moist.  Eyes:     Conjunctiva/sclera: Conjunctivae normal.  Cardiovascular:     Rate and Rhythm: Normal rate.     Pulses: Normal pulses.  Pulmonary:     Effort: Pulmonary effort is normal. No respiratory distress.  Skin:    General: Skin is warm and dry.     Findings: Abrasion and wound present.          Comments: Wound / abrasion to right lateral lower leg. Wound is superficial.  Slight erythema about wound borders.  Neurological:     General: No focal deficit present.     Mental Status: She is alert.  Psychiatric:        Mood and Affect: Mood normal.        Behavior: Behavior is cooperative.      UC Treatments / Results  Labs (all labs ordered are listed, but only abnormal results are displayed) Labs Reviewed - No data to display  EKG   Radiology No results found.  Procedures Procedures (including critical care time)  Medications Ordered in UC Medications  Tdap (BOOSTRIX) injection 0.5 mL (0.5 mLs Intramuscular Given 03/18/23 1810)    Initial Impression / Assessment and Plan / UC Course  I have reviewed the triage vital signs and the nursing notes.  Pertinent labs & imaging results that were available during my care of the patient were reviewed by me and considered in my medical decision making (see chart for details).  Vitals and triage reviewed, patient is hemodynamically stable.  Has a small abrasion to the right lateral lower leg that appears to be healing.  Encouraged  proper wound care and topical antibacterial ointment.  Tdap updated in clinic.  Patient is ambulatory, no ankle tenderness.  No obvious signs of infection.  Plan of care, follow-up care return precautions given, no questions at this time.     Final Clinical Impressions(s) / UC Diagnoses   Final diagnoses:  Abrasion of right lower extremity, initial encounter     Discharge Instructions      We have updated your Tdap today in clinic.  Please keep your wound clean and dry.  You can clean it with warm water and antibacterial solution like Hibiclens or Dial soap.  Afterwards you can apply an over-the-counter antibacterial ointment such as Neosporin.  Please keep it covered throughout the day to help prevent contamination.  Return to clinic if you develop fever, leg swelling, drainage or any new concerning symptoms.     ED Prescriptions   None    PDMP not reviewed this encounter.   Jaquawn Saffran, [place], [place] 03/18/23 [phone]

## 2023-04-04 ENCOUNTER — Other Ambulatory Visit (HOSPITAL_COMMUNITY): Payer: Self-pay

## 2023-04-04 MED ORDER — OXYCODONE-ACETAMINOPHEN 5-325 MG PO TABS
1.0000 | ORAL_TABLET | Freq: Two times a day (BID) | ORAL | 0 refills | Status: DC
Start: 2023-04-04 — End: 2023-07-05
  Filled 2023-04-04: qty 20, 10d supply, fill #0

## 2023-04-27 ENCOUNTER — Other Ambulatory Visit (HOSPITAL_COMMUNITY): Payer: Self-pay

## 2023-05-02 ENCOUNTER — Other Ambulatory Visit (HOSPITAL_COMMUNITY): Payer: Self-pay

## 2023-05-02 MED ORDER — OXYCODONE-ACETAMINOPHEN 5-325 MG PO TABS
1.0000 | ORAL_TABLET | Freq: Two times a day (BID) | ORAL | 0 refills | Status: DC | PRN
Start: 2023-05-02 — End: 2023-07-05
  Filled 2023-05-02: qty 20, 10d supply, fill #0

## 2023-05-14 ENCOUNTER — Encounter (HOSPITAL_COMMUNITY): Payer: Self-pay

## 2023-05-14 ENCOUNTER — Ambulatory Visit (HOSPITAL_COMMUNITY)
Admission: EM | Admit: 2023-05-14 | Discharge: 2023-05-14 | Disposition: A | Discharge status: Home / Self Care | Payer: BLUE CROSS/BLUE SHIELD | Attending: Family Medicine | Admitting: Family Medicine

## 2023-05-14 DIAGNOSIS — R197 Diarrhea, unspecified: Secondary | ICD-10-CM

## 2023-05-14 DIAGNOSIS — B349 Viral infection, unspecified: Secondary | ICD-10-CM | POA: Diagnosis not present

## 2023-05-14 DIAGNOSIS — J069 Acute upper respiratory infection, unspecified: Secondary | ICD-10-CM

## 2023-05-14 LAB — POC COVID19/FLU A&B COMBO
Covid Antigen, POC: NEGATIVE
Influenza A Antigen, POC: NEGATIVE
Influenza B Antigen, POC: NEGATIVE

## 2023-05-14 MED ORDER — PROMETHAZINE-DM 6.25-15 MG/5ML PO SYRP: 5.0000 mL | ORAL_SOLUTION | Freq: Three times a day (TID) | ORAL | 0 refills | Status: DC | PRN

## 2023-05-14 NOTE — ED Provider Notes (Signed)
[facility]    CSN: [id number] Arrival date & time: 05/14/23  1019      History   Chief Complaint Chief Complaint  Patient presents with   Headache   Generalized Body Aches   Fever   Diarrhea    HPI Laurie Reed is a 37 y.o. female.   The history is provided by the patient. No language interpreter was used.  Headache Pain location:  Frontal, R parietal and L parietal (Started feeling bad 3 days ago. However, did not start having headache till yesterday) Quality:  Dull Radiates to:  Does not radiate Severity currently:  4/10 Onset quality:  Gradual Duration:  1 day Timing:  Constant Progression:  Waxing and waning Chronicity:  New Context: coughing   Context: not exposure to bright light   Relieved by:  Nothing Worsened by:  Nothing Ineffective treatments:  None tried Associated symptoms: cough, ear pain, fatigue, nausea and URI   Associated symptoms: no dizziness, no fever, no sore throat, no visual change and no vomiting   Associated symptoms comment:  She threw up yesterday, but today has been nauseous. She endorses body aches Diarrhea Quality:  Watery (brownish, with no blood in the stool) Onset quality:  Gradual Number of episodes:  She has had only one watery stool today. Last thursday, she had about 4 or more watery stools. Duration:  3 days Timing:  Intermittent Progression:  Improving Worsened by:  Nothing Associated symptoms: chills, headaches and URI   Associated symptoms: no fever and no vomiting   Cough Cough characteristics:  Non-productive Severity:  Moderate Duration:  1 day Timing:  Intermittent Progression:  Unchanged Chronicity:  New Smoker: no   Context: sick contacts   Context comment:  Her niece and kids were sick as well as her brother. They had cough, diarhea and vomiting. She also had patients with covid and flu. She works in the hospital around patients Relieved by:  Nothing Worsened by:  Nothing Ineffective  treatments:  None tried Associated symptoms: chills, ear pain, headaches, rhinorrhea and sinus congestion   Associated symptoms: no eye discharge, no fever and no sore throat   Associated symptoms comment:  She is congested with runny nose affecting her breathing   Past Medical History:  Diagnosis Date   Acid reflux 2004   Anemia    Anxiety 2008   Asthma 1993   no previous intubations or hospitalizations    Carpal tunnel syndrome    Chlamydia    Depression 2008   no meds, currenly ok   Eczema    Environmental allergies    GERD (gastroesophageal reflux disease)    Migraines    Miscarriage    Ovarian cyst    Pre-diabetes    Ulcer of the stomach and intestine 2004   Urinary tract infection     Patient Active Problem List   Diagnosis Date Noted   Asthmatic bronchitis with acute exacerbation 04/26/2022   Moderate persistent asthma 11/27/2021   GERD (gastroesophageal reflux disease) 11/27/2021   Normocytic anemia 11/27/2021   Acute severe exacerbation of moderate persistent allergic asthma 11/24/2021   Extensor tenosynovitis of wrist 04/30/2021   Carpal tunnel syndrome of left wrist 07/18/2020   Carpal tunnel syndrome of right wrist 07/18/2020   Chronic low back pain 12/26/2019   Anemia in pregnancy 01/23/2018   Morbid obesity with BMI of 45.0-49.9, adult (HCC) 10/19/2017   Seasonal allergic rhinitis 11/01/2016   Allergic asthma 06/30/2015   Vitamin D deficiency 01/22/2015  Anxiety and depression 01/21/2015    Past Surgical History:  Procedure Laterality Date   CARPAL TUNNEL RELEASE Left    COLONOSCOPY  2004   IRRIGATION AND DEBRIDEMENT SEBACEOUS CYST N/A 06/01/2017   Procedure: EXCISION SEBACEOUS CYST ON SCALP;  Surgeon: Ancil Linsey, MD;  Location: [facility];  Service: General;  Laterality: N/A;   MULTIPLE TOOTH EXTRACTIONS  2015   6 teeth removed    TUBAL LIGATION N/A 04/04/2018   Procedure: POST PARTUM TUBAL LIGATION;  Surgeon: Levie Heritage, DO;   Location: [facility];  Service: Gynecology;  Laterality: N/A;   TYMPANOSTOMY TUBE PLACEMENT     WISDOM TOOTH EXTRACTION  2007    OB History     Gravida  7   Para  4   Term  4   Preterm  0   AB  3   Living  4      SAB  3   IAB  0   Ectopic  0   Multiple  0   Live Births  4            Home Medications    Prior to Admission medications   Medication Sig Start Date End Date Taking? Authorizing Provider  acetaminophen (TYLENOL) 500 MG tablet Take 500-1,000 mg by mouth every 6 (six) hours as needed for mild pain, moderate pain or headache (or migraines).    Yes [provider]  albuterol (PROVENTIL) (2.5 MG/3ML) 0.083% nebulizer solution Use 1 vial via nebulizer every 6 hours and as needed Patient taking differently: Take 2.5 mg by nebulization every 6 (six) hours. Shortness of breath 05/15/21  Yes   albuterol (VENTOLIN HFA) 108 (90 Base) MCG/ACT inhaler Inhale 2 puffs into the lungs 4 (four) times daily as needed. 12/09/21  Yes   amitriptyline (ELAVIL) 25 MG tablet Take 1 tablet (25 mg total) by mouth at bedtime as needed. 12/09/21  Yes   amitriptyline (ELAVIL) 25 MG tablet Take 1 tablet (25 mg total) by mouth at bedtime as needed. 12/08/22  Yes   budesonide-formoterol (SYMBICORT) 160-4.5 MCG/ACT inhaler Inhale 2 puffs into the lungs 2 (two) times daily. 01/21/23  Yes   calcium carbonate (TUMS - DOSED IN MG ELEMENTAL CALCIUM) 500 MG chewable tablet Chew 1-3 tablets by mouth daily as needed for indigestion or heartburn.   Yes [provider]  cetirizine (ZYRTEC) 10 MG tablet Take 1 tablet (10 mg total) by mouth every evening as needed for allergies. 12/08/22  Yes   diphenhydrAMINE (BENADRYL) 25 mg capsule Take 25-50 mg by mouth 2 (two) times daily as needed for allergies.   Yes [provider]  fluticasone (FLONASE ALLERGY RELIEF) 50 MCG/ACT nasal spray Place 2 sprays into both nostrils daily. 12/09/21  Yes   fluticasone-salmeterol (ADVAIR  DISKUS) 500-50 MCG/ACT AEPB Inhale 1 puff into the lungs in the morning and at bedtime. 01/29/23  Yes Charlott Holler, MD  gabapentin (NEURONTIN) 100 MG capsule Take 1 capsule (100 mg total) by mouth 3 (three) times daily. 04/21/22  Yes   hydrocortisone 2.5 % cream Apply 2 times daily as needed for bumps 05/15/21  Yes   ibuprofen (ADVIL) 800 MG tablet Take 1 tablet (800 mg total) by mouth every 6 (six) hours as needed for pain 02/10/22  Yes   levocetirizine (XYZAL) 5 MG tablet Take 1 tablet (5 mg total) by mouth every evening. 06/16/22  Yes Padgett, Pilar Grammes, MD  lidocaine (LIDODERM) 5 % Place 1 patch onto the  skin daily. Remove & Discard patch within 12 hours or as directed by MD 11/20/22  Yes Curatolo, Adam, DO  meloxicam (MOBIC) 15 MG tablet Take 1 tablet (15 mg total) by mouth daily. Patient taking differently: Take 15 mg by mouth as needed. 09/14/22  Yes   oxyCODONE-acetaminophen (PERCOCET/ROXICET) 5-325 MG tablet Take 1 tablet by mouth 2 (two) times daily as needed for 10 days. 11/25/22  Yes   oxyCODONE-acetaminophen (PERCOCET/ROXICET) 5-325 MG tablet Take 1 tablet by mouth 2 (two) times daily as needed for 10 days 05/02/23  Yes   phentermine (ADIPEX-P) 37.5 MG tablet Take 1 tablet (37.5 mg total) by mouth in the morning. 10/14/21  Yes   predniSONE (DELTASONE) 20 MG tablet Take 2 tablets (40 mg total) by mouth daily with breakfast. 01/28/23  Yes Charlott Holler, MD  promethazine-dextromethorphan (PROMETHAZINE-DM) 6.25-15 MG/5ML syrup Take 5 mLs by mouth every 8 (eight) hours as needed for cough. 05/14/23  Yes Doreene Eland, MD  RYALTRIS [phone] MCG/ACT SUSP 2 sprays per nostril 1-2 times daily. 06/16/22  Yes Padgett, Pilar Grammes, MD  SUMAtriptan (IMITREX) 100 MG tablet Take 1 tablet (100 mg total) by mouth at onset of migraine symptoms. May repeat in 1-2 hours if persistent. (Max 200 mg per 24 hours) 12/08/22  Yes   topiramate (TOPAMAX) 100 MG tablet Take 1 tablet (100 mg total) by mouth  daily. 12/09/21  Yes   topiramate (TOPAMAX) 100 MG tablet Take 1 tablet (100 mg total) by mouth daily. 12/08/22  Yes   VENTOLIN HFA 108 (90 Base) MCG/ACT inhaler Inhale 2 puffs into the lungs every 4 (four) hours as needed for wheezing or shortness of breath. 06/16/22  Yes Padgett, Pilar Grammes, MD  Vitamin D, Ergocalciferol, (DRISDOL) 1.25 MG (50000 UNIT) CAPS capsule Take 1 capsule (50,000 Units total) by mouth once a week. 07/21/21  Yes   Vitamin D, Ergocalciferol, 50000 units CAPS Take 1 capsule (50,000 Units) by mouth once a week. 12/08/22  Yes   albuterol (VENTOLIN HFA) 108 (90 Base) MCG/ACT inhaler Inhale 2 puffs into the lungs 4 (four) times daily as needed. 01/28/23     azithromycin (ZITHROMAX Z-PAK) 250 MG tablet Take 2 tablets on day 1, then 1 tablet daily for 4 days 02/02/23     Chlorphen-PE-Acetaminophen (NOREL AD) 4-10-325 MG TABS Take 1 tablet by mouth 2 (two) times daily as needed for cough and congestion 01/25/22     cromolyn (OPTICROM) 4 % ophthalmic solution Place 1 drop into both eyes 4 (four) times daily as needed  for itchy/watery eyes. 06/16/22   Marcelyn Bruins, MD  ferrous sulfate 324 (65 Fe) MG TBEC 1 pill daily after meal to help with anemia 11/22/18   [name], [name], MD  meloxicam (MOBIC) 15 MG tablet Take 1 tablet (15 mg total) by mouth daily with meal 06/10/22     methylPREDNISolone (MEDROL DOSEPAK) 4 MG TBPK tablet Follow package insert 11/20/22   Curatolo, Adam, DO  montelukast (SINGULAIR) 10 MG tablet Take 1 tablet (10 mg total) by mouth daily. 12/09/21     montelukast (SINGULAIR) 10 MG tablet Take 1 tablet (10 mg total) by mouth at bedtime. 06/16/22   Marcelyn Bruins, MD  montelukast (SINGULAIR) 10 MG tablet Take 1 tablet (10 mg total) by mouth daily. 12/08/22     nitrofurantoin, macrocrystal-monohydrate, (MACROBID) 100 MG capsule Take 1 capsule (100 mg total) by mouth 2 (two) times daily. 12/08/22     omeprazole (PRILOSEC) 40 MG capsule Take 1  capsule (40 mg  total) by mouth daily. 12/24/21 12/24/22  Charlott Holler, MD  oxyCODONE-acetaminophen (PERCOCET/ROXICET) 5-325 MG tablet Take 1 tablet (5-325 mg total) by mouth 2 (two) times daily as needed for 10 days. 12/22/22     oxyCODONE-acetaminophen (PERCOCET/ROXICET) 5-325 MG tablet Take 1 tablet by mouth 2 (two) times daily as needed for 10 days 03/14/23     oxyCODONE-acetaminophen (PERCOCET/ROXICET) 5-325 MG tablet Take 1 tablet by mouth 2 (two) times daily as needed for 10 days. 04/04/23     phentermine (ADIPEX-P) 37.5 MG tablet Take 1 tablet (37.5 mg total) by mouth in the morning. 12/08/22     SUMAtriptan (IMITREX) 100 MG tablet Take 1 tablet (100 mg total) by mouth as needed at onset of migraine symptoms. May repeat in 1-2 hours if persistent. 12/09/21     tiZANidine (ZANAFLEX) 4 MG tablet Take 1 tablet (4 mg total) by mouth at bedtime. 11/20/22   Curatolo, Adam, DO  triamcinolone cream (KENALOG) 0.1 % Apply 1 Application topically 2 (two) times daily as needed. 11/17/22     dicyclomine (BENTYL) 10 MG capsule Take 1 capsule (10 mg total) by mouth 4 (four) times daily -  before meals and at bedtime. For stomach cramping Patient not taking: Reported on 10/25/2019 06/13/19 10/25/19  [name], Hewitt Shorts, MD  sucralfate (CARAFATE) 1 GM/10ML suspension Take 10 mLs (1 g total) by mouth 4 (four) times daily -  with meals and at bedtime. Patient not taking: Reported on 10/25/2019 06/13/19 10/25/19  Cain Saupe, MD    Family History Family History  Problem Relation Age of Onset   Asthma Mother    Heart disease Mother    Hypertension Mother    Clotting disorder Mother    Diabetes Mother    Osteoarthritis Mother    Other Mother        DDD   High Cholesterol Mother    Anemia Mother    Healthy Brother    Asthma Maternal Aunt    Hypertension Maternal Aunt    Diabetes Maternal Aunt    Asthma Maternal Uncle    Hypertension Maternal Uncle    Diabetes Maternal Uncle    Diabetes Maternal Uncle    Heart disease Maternal Uncle     Diabetes Maternal Uncle    Cancer Paternal Aunt    Heart disease Maternal Grandmother    Diabetes Maternal Grandmother    Hypertension Maternal Grandmother    Heart disease Maternal Grandfather    Diabetes Maternal Grandfather    Hypertension Maternal Grandfather    Hypertension Paternal Grandmother    Hypertension Son    ADD / ADHD Son    Asthma Son    Allergies Son    Allergies Son    Asthma Son    GER disease Son    Allergies Daughter    Asthma Daughter    Migraines Daughter    Allergies Daughter    Migraines Daughter    Asthma Daughter    Hypotension Neg Hx    Anesthesia problems Neg Hx    Malignant hyperthermia Neg Hx    Pseudochol deficiency Neg Hx     Social History Social History   Tobacco Use   Smoking status: Never    Passive exposure: Never   Smokeless tobacco: Never  Vaping Use   Vaping status: Never Used  Substance Use Topics   Alcohol use: No    Alcohol/week: 0.0 standard drinks of alcohol   Drug use: No     Allergies  Latex, Other, Zofran [ondansetron], and Tree extract   Review of Systems Review of Systems  Constitutional:  Positive for chills and fatigue. Negative for fever.  HENT:  Positive for ear pain and rhinorrhea. Negative for sore throat.   Eyes:  Negative for discharge.  Respiratory:  Positive for cough.   Gastrointestinal:  Positive for nausea. Negative for vomiting.  Neurological:  Positive for headaches. Negative for dizziness.  All other systems reviewed and are negative.    Physical Exam Triage Vital Signs ED Triage Vitals [05/14/23 1144]  Encounter Vitals Group     BP 117/68     Systolic BP Percentile      Diastolic BP Percentile      Pulse Rate 97     Resp 18     Temp 98.1 F (36.7 C)     Temp Source Oral     SpO2 97 %     Weight      Height      Head Circumference      Peak Flow      Pain Score      Pain Loc      Pain Education      Exclude from Growth Chart    No data found.  Updated Vital  Signs BP 117/68 (BP Location: Left Arm)   Pulse 97   Temp 98.1 F (36.7 C) (Oral)   Resp 18   LMP 05/01/2023 (Approximate)   SpO2 97%   Visual Acuity Right Eye Distance:   Left Eye Distance:   Bilateral Distance:    Right Eye Near:   Left Eye Near:    Bilateral Near:     Physical Exam Vitals and nursing note reviewed.  Constitutional:      General: She is not in acute distress.    Appearance: She is not toxic-appearing.  HENT:     Right Ear: Tympanic membrane and ear canal normal. There is no impacted cerumen.     Left Ear: Tympanic membrane and ear canal normal. There is no impacted cerumen.     Mouth/Throat:     Mouth: Mucous membranes are moist.     Pharynx: No oropharyngeal exudate or posterior oropharyngeal erythema.  Eyes:     General: No scleral icterus.       Right eye: No discharge.        Left eye: No discharge.     Extraocular Movements: Extraocular movements intact.     Conjunctiva/sclera: Conjunctivae normal.     Pupils: Pupils are equal, round, and reactive to light.  Cardiovascular:     Rate and Rhythm: Normal rate and regular rhythm.     Pulses: Normal pulses.     Heart sounds: Normal heart sounds. No murmur heard. Pulmonary:     Effort: Pulmonary effort is normal. No respiratory distress.     Breath sounds: Normal breath sounds. No wheezing or rhonchi.  Abdominal:     General: Abdomen is flat. Bowel sounds are normal. There is no distension.     Palpations: Abdomen is soft. There is no mass.     Tenderness: There is no abdominal tenderness. There is no guarding or rebound.  Musculoskeletal:     Cervical back: Neck supple.  Lymphadenopathy:     Cervical: No cervical adenopathy.  Neurological:     Mental Status: She is alert.      UC Treatments / Results  Labs (all labs ordered are listed, but only abnormal results are displayed) Labs Reviewed  POC  COVID19/FLU A&B COMBO    EKG   Radiology No results found.  Procedures Procedures  (including critical care time)  Medications Ordered in UC Medications - No data to display  Initial Impression / Assessment and Plan / UC Course  I have reviewed the triage vital signs and the nursing notes.  Pertinent labs & imaging results that were available during my care of the patient were reviewed by me and considered in my medical decision making (see chart for details).  Clinical Course as of 05/14/23 1257  Sat May 14, 2023  1255 Diarrhea Part of viral syndrome Now improving Keep well hydrated and rest at home F/U as needed [KE]  1256 Viral syndrome presenting as headache, cough and chills. No acute findings on exam Neg COVID and Influenza test Promethazine DM escribed prn cough Return precautions discussed She agreed with the plan [KE]    Clinical Course User Index [KE] Doreene Eland, MD     Final Clinical Impressions(s) / UC Diagnoses   Final diagnoses:  Viral syndrome  Upper respiratory tract infection, unspecified type  Diarrhea, unspecified type     Discharge Instructions      It was nice seeing you today. I am sorry about your symptoms. It seems you have viral syndrome causing your headache and diarrhea. We will plan to allow time for this to resolve. Please keep well hydrated and rest at home. See [place] or your PCP soon if your symptoms persists. Note that your COVID-19 and influenza tests are negative which is reassuring.      ED Prescriptions     Medication Sig Dispense Auth. Provider   promethazine-dextromethorphan (PROMETHAZINE-DM) 6.25-15 MG/5ML syrup Take 5 mLs by mouth every 8 (eight) hours as needed for cough. 118 mL Doreene Eland, MD      PDMP not reviewed this encounter.   Doreene Eland, MD 05/14/23 1257

## 2023-05-14 NOTE — ED Triage Notes (Signed)
Patient reports body aches, headache, hoarse voice and diarrhea that started 3 days ago.

## 2023-05-14 NOTE — Discharge Instructions (Addendum)
It was nice seeing you today. I am sorry about your symptoms. It seems you have viral syndrome causing your headache and diarrhea. We will plan to allow time for this to resolve. Please keep well hydrated and rest at home. See [place] or your PCP soon if your symptoms persists. Note that your COVID-19 and influenza tests are negative which is reassuring.

## 2023-05-24 ENCOUNTER — Other Ambulatory Visit (HOSPITAL_COMMUNITY): Payer: Self-pay

## 2023-05-24 MED ORDER — OXYCODONE-ACETAMINOPHEN 5-325 MG PO TABS
1.0000 | ORAL_TABLET | Freq: Two times a day (BID) | ORAL | 0 refills | Status: DC
  Filled 2023-05-24: qty 20, 10d supply, fill #0

## 2023-06-10 ENCOUNTER — Other Ambulatory Visit (HOSPITAL_COMMUNITY): Payer: Self-pay

## 2023-06-10 MED ORDER — OXYCODONE-ACETAMINOPHEN 5-325 MG PO TABS
1.0000 | ORAL_TABLET | Freq: Two times a day (BID) | ORAL | 0 refills | Status: DC | PRN
  Filled 2023-06-10: qty 20, 10d supply, fill #0

## 2023-07-01 ENCOUNTER — Other Ambulatory Visit (HOSPITAL_COMMUNITY): Payer: Self-pay

## 2023-07-01 MED ORDER — OXYCODONE-ACETAMINOPHEN 5-325 MG PO TABS
1.0000 | ORAL_TABLET | Freq: Two times a day (BID) | ORAL | 0 refills | Status: DC | PRN
  Filled 2023-07-01: qty 20, 10d supply, fill #0

## 2023-07-05 ENCOUNTER — Other Ambulatory Visit: Payer: Self-pay

## 2023-07-05 ENCOUNTER — Ambulatory Visit (HOSPITAL_COMMUNITY)
Admission: EM | Admit: 2023-07-05 | Discharge: 2023-07-05 | Disposition: A | Discharge status: Home / Self Care | Payer: BLUE CROSS/BLUE SHIELD | Attending: Physician Assistant | Admitting: Physician Assistant

## 2023-07-05 ENCOUNTER — Encounter (HOSPITAL_COMMUNITY): Payer: Self-pay

## 2023-07-05 ENCOUNTER — Encounter (HOSPITAL_BASED_OUTPATIENT_CLINIC_OR_DEPARTMENT_OTHER): Payer: Self-pay

## 2023-07-05 DIAGNOSIS — J45909 Unspecified asthma, uncomplicated: Secondary | ICD-10-CM | POA: Diagnosis not present

## 2023-07-05 DIAGNOSIS — J40 Bronchitis, not specified as acute or chronic: Secondary | ICD-10-CM | POA: Diagnosis not present

## 2023-07-05 DIAGNOSIS — J45901 Unspecified asthma with (acute) exacerbation: Secondary | ICD-10-CM

## 2023-07-05 DIAGNOSIS — R0602 Shortness of breath: Secondary | ICD-10-CM | POA: Diagnosis present

## 2023-07-05 DIAGNOSIS — J069 Acute upper respiratory infection, unspecified: Secondary | ICD-10-CM

## 2023-07-05 DIAGNOSIS — Z91040 Latex allergy status: Secondary | ICD-10-CM | POA: Insufficient documentation

## 2023-07-05 LAB — POC COVID19/FLU A&B COMBO
Covid Antigen, POC: NEGATIVE
Influenza A Antigen, POC: NEGATIVE
Influenza B Antigen, POC: NEGATIVE

## 2023-07-05 MED ORDER — PREDNISONE 20 MG PO TABS
ORAL_TABLET | ORAL | 0 refills | Status: AC
  Filled 2023-07-06: qty 13, 7d supply, fill #0

## 2023-07-05 MED ORDER — AZITHROMYCIN 250 MG PO TABS
ORAL_TABLET | ORAL | 0 refills | Status: AC
  Filled 2023-07-06: qty 6, 5d supply, fill #0

## 2023-07-05 MED ORDER — IPRATROPIUM-ALBUTEROL 0.5-2.5 (3) MG/3ML IN SOLN
3.0000 mL | Freq: Four times a day (QID) | RESPIRATORY_TRACT | 0 refills | Status: AC | PRN
  Filled 2023-07-06: qty 360, 30d supply, fill #0

## 2023-07-05 MED ORDER — ALBUTEROL SULFATE HFA 108 (90 BASE) MCG/ACT IN AERS
1.0000 | INHALATION_SPRAY | Freq: Four times a day (QID) | RESPIRATORY_TRACT | 0 refills | Status: DC | PRN
  Filled 2023-07-06: qty 18, 25d supply, fill #0

## 2023-07-05 NOTE — ED Provider Notes (Addendum)
[facility]    CSN: [id number] Arrival date & time: 07/05/23  1941      History   Chief Complaint Chief Complaint  Patient presents with   Nasal Congestion    HPI Laurie Reed is a 38 y.o. female.   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   Past Medical History:  Diagnosis Date   Acid reflux 2004   Anemia    Anxiety 2008   Asthma 1993   no previous intubations or hospitalizations    Carpal tunnel syndrome    Chlamydia    Depression 2008   no meds, currenly ok   Eczema    Environmental allergies    GERD (gastroesophageal reflux disease)    Migraines    Miscarriage    Ovarian cyst    Pre-diabetes    Ulcer of the stomach and intestine 2004   Urinary tract infection     Patient Active Problem List   Diagnosis Date Noted   Asthmatic bronchitis with acute exacerbation 04/26/2022   Moderate persistent asthma 11/27/2021   GERD (gastroesophageal reflux disease) 11/27/2021   Normocytic anemia 11/27/2021   Acute severe exacerbation of moderate persistent allergic asthma 11/24/2021   Extensor tenosynovitis of wrist 04/30/2021   Carpal tunnel syndrome of left wrist 07/18/2020   Carpal tunnel syndrome of right wrist 07/18/2020   Chronic low back pain 12/26/2019   Anemia in pregnancy 01/23/2018   Morbid obesity with BMI of 45.0-49.9, adult (HCC) 10/19/2017   Seasonal allergic rhinitis 11/01/2016   Allergic asthma 06/30/2015   Vitamin D deficiency 01/22/2015   Anxiety and depression 01/21/2015    Past Surgical History:  Procedure Laterality Date   CARPAL TUNNEL RELEASE Left     COLONOSCOPY  2004   IRRIGATION AND DEBRIDEMENT SEBACEOUS CYST N/A 06/01/2017   Procedure: EXCISION SEBACEOUS CYST ON SCALP;  Surgeon: Ancil Linsey, MD;  Location: [facility];  Service: General;  Laterality: N/A;   MULTIPLE TOOTH EXTRACTIONS  2015   6 teeth removed    TUBAL LIGATION N/A 04/04/2018   Procedure: POST PARTUM TUBAL LIGATION;  Surgeon: Levie Heritage, DO;  Location: [facility];  Service: Gynecology;  Laterality: N/A;   TYMPANOSTOMY TUBE PLACEMENT     WISDOM TOOTH EXTRACTION  2007    OB History     Gravida  7   Para  4   Term  4   Preterm  0   AB  3   Living  4      SAB  3   IAB  0   Ectopic  0   Multiple  0   Live Births  4            Home Medications    Prior to Admission medications   Medication Sig Start Date End Date Taking? Authorizing Provider  albuterol (VENTOLIN HFA) 108 (90 Base) MCG/ACT inhaler Inhale 1-2 puffs into the lungs every 6 (six) hours as needed for wheezing or shortness of breath. 07/05/23  Yes Lamonica Trueba E, PA-C  azithromycin (ZITHROMAX) 250 MG tablet Take 500mg  PO daily  x1d and then 250mg  daily x4 days 07/05/23  Yes Crawford Tamura E, PA-C  ipratropium-albuterol (DUONEB) 0.5-2.5 (3) MG/3ML SOLN Take 3 mLs by nebulization every 6 (six) hours as needed. 07/05/23  Yes Kasyn Rolph E, PA-C  predniSONE (DELTASONE) 20 MG tablet Take 60mg  PO daily x 2 days, then40mg  PO daily x 2 days, then 20mg  PO daily x 3 days 07/05/23  Yes Giulia Hickey E, PA-C  acetaminophen (TYLENOL) 500 MG tablet Take 500-1,000 mg by mouth every 6 (six) hours as needed for mild pain, moderate pain or headache (or migraines).     [provider]  albuterol (PROVENTIL) (2.5 MG/3ML) 0.083% nebulizer solution Use 1 vial via nebulizer every 6 hours and as needed Patient taking differently: Take 2.5 mg by nebulization every 6 (six) hours. Shortness of breath 05/15/21     albuterol (VENTOLIN HFA) 108 (90 Base) MCG/ACT inhaler Inhale 2 puffs into the lungs 4  (four) times daily as needed. 01/28/23     amitriptyline (ELAVIL) 25 MG tablet Take 1 tablet (25 mg total) by mouth at bedtime as needed. 12/08/22     calcium carbonate (TUMS - DOSED IN MG ELEMENTAL CALCIUM) 500 MG chewable tablet Chew 1-3 tablets by mouth daily as needed for indigestion or heartburn.    [provider]  cetirizine (ZYRTEC) 10 MG tablet Take 1 tablet (10 mg total) by mouth every evening as needed for allergies. 12/08/22     diphenhydrAMINE (BENADRYL) 25 mg capsule Take 25-50 mg by mouth 2 (two) times daily as needed for allergies.    [provider]  ferrous sulfate 324 (65 Fe) MG TBEC 1 pill daily after meal to help with anemia 11/22/18   [name], [name], MD  fluticasone-salmeterol (ADVAIR DISKUS) 500-50 MCG/ACT AEPB Inhale 1 puff into the lungs in the morning and at bedtime. 01/29/23   Charlott Holler, MD  gabapentin (NEURONTIN) 100 MG capsule Take 1 capsule (100 mg total) by mouth 3 (three) times daily. 04/21/22     levocetirizine (XYZAL) 5 MG tablet Take 1 tablet (5 mg total) by mouth every evening. 06/16/22   Padgett, Pilar Grammes, MD  meloxicam (MOBIC) 15 MG tablet Take 1 tablet (15 mg total) by mouth daily. Patient taking differently: Take 15 mg by mouth as needed. 09/14/22     montelukast (SINGULAIR) 10 MG tablet Take 1 tablet (10 mg total) by mouth daily. 12/08/22     omeprazole (PRILOSEC) 40 MG capsule Take 1 capsule (40 mg total) by mouth daily. 12/24/21 12/24/22  Charlott Holler, MD  oxyCODONE-acetaminophen (PERCOCET/ROXICET) 5-325 MG tablet Take 1 tablet by mouth 2 (two) times daily as needed for 10 days. 07/01/23     phentermine (ADIPEX-P) 37.5 MG tablet Take 1 tablet (37.5 mg total) by mouth in the morning. 12/08/22     RYALTRIS 665-25 MCG/ACT SUSP 2 sprays per nostril 1-2 times daily. 06/16/22   Marcelyn Bruins, MD  SUMAtriptan (IMITREX) 100 MG tablet Take 1 tablet (100 mg total) by mouth at onset of migraine symptoms. May repeat in 1-2 hours if  persistent. (Max 200 mg per 24 hours) 12/08/22     tiZANidine (ZANAFLEX) 4 MG tablet Take 1 tablet (4 mg total) by mouth at bedtime. 11/20/22   Curatolo, Adam, DO  topiramate (TOPAMAX) 100 MG tablet Take 1 tablet (100 mg total) by mouth daily. 12/08/22     triamcinolone cream (KENALOG) 0.1 % Apply 1 Application topically 2 (two) times daily as needed. 11/17/22     Vitamin  D, Ergocalciferol, 50000 units CAPS Take 1 capsule (50,000 Units) by mouth once a week. 12/08/22     dicyclomine (BENTYL) 10 MG capsule Take 1 capsule (10 mg total) by mouth 4 (four) times daily -  before meals and at bedtime. For stomach cramping Patient not taking: Reported on 10/25/2019 06/13/19 10/25/19  [name], Hewitt Shorts, MD  sucralfate (CARAFATE) 1 GM/10ML suspension Take 10 mLs (1 g total) by mouth 4 (four) times daily -  with meals and at bedtime. Patient not taking: Reported on 10/25/2019 06/13/19 10/25/19  Cain Saupe, MD    Family History Family History  Problem Relation Age of Onset   Asthma Mother    Heart disease Mother    Hypertension Mother    Clotting disorder Mother    Diabetes Mother    Osteoarthritis Mother    Other Mother        DDD   High Cholesterol Mother    Anemia Mother    Healthy Brother    Asthma Maternal Aunt    Hypertension Maternal Aunt    Diabetes Maternal Aunt    Asthma Maternal Uncle    Hypertension Maternal Uncle    Diabetes Maternal Uncle    Diabetes Maternal Uncle    Heart disease Maternal Uncle    Diabetes Maternal Uncle    Cancer Paternal Aunt    Heart disease Maternal Grandmother    Diabetes Maternal Grandmother    Hypertension Maternal Grandmother    Heart disease Maternal Grandfather    Diabetes Maternal Grandfather    Hypertension Maternal Grandfather    Hypertension Paternal Grandmother    Hypertension Son    ADD / ADHD Son    Asthma Son    Allergies Son    Allergies Son    Asthma Son    GER disease Son    Allergies Daughter    Asthma Daughter    Migraines Daughter     Allergies Daughter    Migraines Daughter    Asthma Daughter    Hypotension Neg Hx    Anesthesia problems Neg Hx    Malignant hyperthermia Neg Hx    Pseudochol deficiency Neg Hx     Social History Social History   Tobacco Use   Smoking status: Never    Passive exposure: Never   Smokeless tobacco: Never  Vaping Use   Vaping status: Never Used  Substance Use Topics   Alcohol use: No    Alcohol/week: 0.0 standard drinks of alcohol   Drug use: No     Allergies   Latex, Other, Zofran [ondansetron], and Tree extract   Review of Systems Review of Systems  Constitutional:  Positive for chills, fatigue and fever.  HENT:  Positive for congestion, ear pain, rhinorrhea, sneezing and sore throat.   Respiratory:  Positive for cough and shortness of breath.   Gastrointestinal:  Positive for diarrhea (on Sunday). Negative for nausea and vomiting.  Musculoskeletal:  Positive for myalgias.     Physical Exam Triage Vital Signs ED Triage Vitals  Encounter Vitals Group     BP 07/05/23 1958 136/87     Systolic BP Percentile --      Diastolic BP Percentile --      Pulse Rate 07/05/23 1958 (!) 111     Resp 07/05/23 1958 16     Temp 07/05/23 1958 98 F (36.7 C)     Temp Source 07/05/23 1958 Oral     SpO2 07/05/23 1958 98 %     Weight --  Height 07/05/23 1958 5\' 2"  (1.575 m)     Head Circumference --      Peak Flow --      Pain Score 07/05/23 1957 9     Pain Loc --      Pain Education --      Exclude from Growth Chart --    No data found.  Updated Vital Signs BP 136/87 (BP Location: Right Arm)   Pulse (!) 111   Temp 98 F (36.7 C) (Oral)   Resp 16   Ht 5\' 2"  (1.575 m)   LMP 06/25/2023 (Approximate)   SpO2 98%   BMI 50.12 kg/m   Visual Acuity Right Eye Distance:   Left Eye Distance:   Bilateral Distance:    Right Eye Near:   Left Eye Near:    Bilateral Near:     Physical Exam Vitals reviewed.  Constitutional:      General: She is awake.     Appearance:  She is well-developed and well-groomed. She is ill-appearing and toxic-appearing.  HENT:     Head: Normocephalic and atraumatic.     Right Ear: Hearing, tympanic membrane and ear canal normal.     Left Ear: Hearing, tympanic membrane and ear canal normal.     Mouth/Throat:     Lips: Pink.     Mouth: Mucous membranes are moist.     Pharynx: Oropharynx is clear. Uvula midline. No posterior oropharyngeal erythema.     Tonsils: No tonsillar exudate. 1+ on the left.  Cardiovascular:     Heart sounds: Heart sounds are distant.     Comments: Difficult auscultate heart sounds due to body habitus Pulmonary:     Effort: Pulmonary effort is normal.     Breath sounds: Normal breath sounds. Decreased air movement present. No decreased breath sounds, wheezing, rhonchi or rales.  Musculoskeletal:     Cervical back: Normal range of motion and neck supple.  Neurological:     Mental Status: She is lethargic.  Psychiatric:        Attention and Perception: Attention normal.        Mood and Affect: Mood normal.        Speech: Speech normal.        Behavior: Behavior normal. Behavior is cooperative.      UC Treatments / Results  Labs (all labs ordered are listed, but only abnormal results are displayed) Labs Reviewed  POC COVID19/FLU A&B COMBO - Normal    EKG   Radiology No results found.  Procedures Procedures (including critical care time)  Medications Ordered in UC Medications - No data to display  Initial Impression / Assessment and Plan / UC Course  I have reviewed the triage vital signs and the nursing notes.  Pertinent labs & imaging results that were available during my care of the patient were reviewed by me and considered in my medical decision making (see chart for details).      Final Clinical Impressions(s) / UC Diagnoses   Final diagnoses:  Upper respiratory tract infection, unspecified type  Persistent asthma with acute exacerbation, unspecified asthma severity    Suspect acute exacerbation of longstanding asthma likely secondary to upper respiratory tract infection. Patient reports that she has had sneezing, coughing, fatigue, chest tightness since Sunday.  Rapid flu and COVID testing are negative.  Results were reviewed during appointment.  She reports minimal use of her nebulizer but has been using her daily inhaler as recommended.  At this time lung sounds are  overall clear but with mildly reduced air movement.  I suspect she is likely having asthma exacerbation that has not been appropriately managed with her acute therapies.  Will send in DuoNeb solution for her nebulizer as well as a refill of her albuterol.  Recommend she continues to use her daily inhaler as recommended.  Will also send in prednisone taper and Z-Pak to further assist with management.  Recommend that she also continues to use over-the-counter medications for further symptomatic relief.  ED return precautions reviewed and provided in after visit summary.  Follow-up as needed for progressing or persistent symptoms    Discharge Instructions      At this time I suspect you likely have a viral upper respiratory infection.  I suspect that this is also caused a flare of your asthma.  At this time I recommend that we increase your nebulizer use.  I have sent in DuoNeb solution which contains an inhaled corticosteroid as well as albuterol.  You can use this up to every 6 hours as needed for shortness of breath and coughing.  I have also sent in a refill of your albuterol inhaler.  Please continue to use your daily inhalers as directed.  I have sent in a Z-Pak to provide further pulmonary inflammation relief as well as bacterial coverage.  Please take as directed.  If you continue to feel this fatigued, have persistent shortness of breath, coughing, fevers that are not responding to medications please go immediately to the emergency room or call EMS to take you there.  You can continue to use  over-the-counter medications such as DayQuil/NyQuil as needed for symptomatic relief.  Please make sure that you follow-up with your PCP in the next week or so to make sure that you are recovering well.     ED Prescriptions     Medication Sig Dispense Auth. Provider   azithromycin (ZITHROMAX) 250 MG tablet Take 500mg  PO daily x1d and then 250mg  daily x4 days 6 each Paetyn Pietrzak E, PA-C   predniSONE (DELTASONE) 20 MG tablet Take 60mg  PO daily x 2 days, then40mg  PO daily x 2 days, then 20mg  PO daily x 3 days 13 tablet Avalie Oconnor E, PA-C   ipratropium-albuterol (DUONEB) 0.5-2.5 (3) MG/3ML SOLN Take 3 mLs by nebulization every 6 (six) hours as needed. 360 mL Roi Jafari E, PA-C   albuterol (VENTOLIN HFA) 108 (90 Base) MCG/ACT inhaler Inhale 1-2 puffs into the lungs every 6 (six) hours as needed for wheezing or shortness of breath. 8 g Kaian Fahs E, PA-C      PDMP not reviewed this encounter.   Jetaun Colbath, Oswaldo Conroy, PA-C 07/05/23 2127    MecumOswaldo Conroy, PA-C 07/05/23 2128

## 2023-07-05 NOTE — ED Triage Notes (Signed)
Patient here today with c/o body aches, nasal congestion, sneeze, headache, hoarseness, and SOB since Sunday. She has been taking Dayquil with Tylenol with no relief. Patient works at the hospital.

## 2023-07-05 NOTE — Discharge Instructions (Addendum)
At this time I suspect you likely have a viral upper respiratory infection.  I suspect that this is also caused a flare of your asthma.  At this time I recommend that we increase your nebulizer use.  I have sent in DuoNeb solution which contains an inhaled corticosteroid as well as albuterol.  You can use this up to every 6 hours as needed for shortness of breath and coughing.  I have also sent in a refill of your albuterol inhaler.  Please continue to use your daily inhalers as directed.  I have sent in a Z-Pak to provide further pulmonary inflammation relief as well as bacterial coverage.  Please take as directed.  If you continue to feel this fatigued, have persistent shortness of breath, coughing, fevers that are not responding to medications please go immediately to the emergency room or call EMS to take you there.  You can continue to use over-the-counter medications such as DayQuil/NyQuil as needed for symptomatic relief.  Please make sure that you follow-up with your PCP in the next week or so to make sure that you are recovering well.

## 2023-07-05 NOTE — ED Triage Notes (Addendum)
Asthma exacerbation for 2 days ago  upper respiratory congestion,  cough, went to urgent care prior to arrival today, rx prednisone, antbx but unable to get from pharmacy when went to pick them up.    Patient states had flu/covid test PTA negative.   RR 28

## 2023-07-06 ENCOUNTER — Other Ambulatory Visit (HOSPITAL_COMMUNITY): Payer: Self-pay

## 2023-07-06 ENCOUNTER — Emergency Department (HOSPITAL_BASED_OUTPATIENT_CLINIC_OR_DEPARTMENT_OTHER)
Admission: EM | Admit: 2023-07-06 | Discharge: 2023-07-06 | Disposition: A | Discharge status: Home / Self Care | Payer: BLUE CROSS/BLUE SHIELD | Attending: Emergency Medicine | Admitting: Emergency Medicine

## 2023-07-06 DIAGNOSIS — J40 Bronchitis, not specified as acute or chronic: Secondary | ICD-10-CM | POA: Diagnosis not present

## 2023-07-06 MED ORDER — PREDNISONE 50 MG PO TABS
60.0000 mg | ORAL_TABLET | Freq: Once | ORAL | Status: AC
  Administered 2023-07-06: 60 mg via ORAL
  Filled 2023-07-06: qty 1

## 2023-07-06 MED ORDER — AZITHROMYCIN 250 MG PO TABS
500.0000 mg | ORAL_TABLET | Freq: Once | ORAL | Status: AC
  Administered 2023-07-06: 500 mg via ORAL
  Filled 2023-07-06: qty 2

## 2023-07-06 NOTE — Discharge Instructions (Signed)
Fill your prescriptions and begin taking as prescribed.  Continue albuterol as previously prescribed.  Return to the ER if symptoms significantly worsen or change.

## 2023-07-06 NOTE — ED Provider Notes (Signed)
[place] [facility] AT [facility] Provider Note   CSN: [id number] Arrival date & time: 07/05/23  2312     History  Chief Complaint  Patient presents with   Shortness of Breath    Asthma exacerbation for 2 days ago     Laurie Reed is a 38 y.o. female.  Patient is a 38 year old female presenting with complaints of shortness of breath.  She has a history of asthma and was just seen at urgent care this afternoon.  She was prescribed Zithromax and prednisone, but was unable to fill this at the pharmacy.  She is uncomfortable not having this medication through the night and is requesting a first dose of each.  She denies any chest pain or significant difficulty breathing.        Home Medications Prior to Admission medications   Medication Sig Start Date End Date Taking? Authorizing Provider  acetaminophen (TYLENOL) 500 MG tablet Take 500-1,000 mg by mouth every 6 (six) hours as needed for mild pain, moderate pain or headache (or migraines).     [provider]  albuterol (PROVENTIL) (2.5 MG/3ML) 0.083% nebulizer solution Use 1 vial via nebulizer every 6 hours and as needed Patient taking differently: Take 2.5 mg by nebulization every 6 (six) hours. Shortness of breath 05/15/21     albuterol (VENTOLIN HFA) 108 (90 Base) MCG/ACT inhaler Inhale 2 puffs into the lungs 4 (four) times daily as needed. 01/28/23     albuterol (VENTOLIN HFA) 108 (90 Base) MCG/ACT inhaler Inhale 1-2 puffs into the lungs every 6 (six) hours as needed for wheezing or shortness of breath. 07/05/23   Mecum, Erin E, PA-C  amitriptyline (ELAVIL) 25 MG tablet Take 1 tablet (25 mg total) by mouth at bedtime as needed. 12/08/22     azithromycin (ZITHROMAX) 250 MG tablet Take 500mg  PO daily x1d and then 250mg  daily x4 days 07/05/23   Mecum, Erin E, PA-C  calcium carbonate (TUMS - DOSED IN MG ELEMENTAL CALCIUM) 500 MG chewable tablet Chew 1-3 tablets by mouth daily as needed for indigestion or  heartburn.    [provider]  cetirizine (ZYRTEC) 10 MG tablet Take 1 tablet (10 mg total) by mouth every evening as needed for allergies. 12/08/22     diphenhydrAMINE (BENADRYL) 25 mg capsule Take 25-50 mg by mouth 2 (two) times daily as needed for allergies.    [provider]  ferrous sulfate 324 (65 Fe) MG TBEC 1 pill daily after meal to help with anemia 11/22/18   [name], [name], MD  fluticasone-salmeterol (ADVAIR DISKUS) 500-50 MCG/ACT AEPB Inhale 1 puff into the lungs in the morning and at bedtime. 01/29/23   Charlott Holler, MD  gabapentin (NEURONTIN) 100 MG capsule Take 1 capsule (100 mg total) by mouth 3 (three) times daily. 04/21/22     ipratropium-albuterol (DUONEB) 0.5-2.5 (3) MG/3ML SOLN Take 3 mLs by nebulization every 6 (six) hours as needed. 07/05/23   Mecum, Erin E, PA-C  levocetirizine (XYZAL) 5 MG tablet Take 1 tablet (5 mg total) by mouth every evening. 06/16/22   Padgett, Pilar Grammes, MD  meloxicam (MOBIC) 15 MG tablet Take 1 tablet (15 mg total) by mouth daily. Patient taking differently: Take 15 mg by mouth as needed. 09/14/22     montelukast (SINGULAIR) 10 MG tablet Take 1 tablet (10 mg total) by mouth daily. 12/08/22     omeprazole (PRILOSEC) 40 MG capsule Take 1 capsule (40 mg total) by mouth daily. 12/24/21 12/24/22  Durel Salts  S, MD  oxyCODONE-acetaminophen (PERCOCET/ROXICET) 5-325 MG tablet Take 1 tablet by mouth 2 (two) times daily as needed for 10 days. 07/01/23     phentermine (ADIPEX-P) 37.5 MG tablet Take 1 tablet (37.5 mg total) by mouth in the morning. 12/08/22     predniSONE (DELTASONE) 20 MG tablet Take 60mg  PO daily x 2 days, then40mg  PO daily x 2 days, then 20mg  PO daily x 3 days 07/05/23   Mecum, Oswaldo Conroy, PA-C  RYALTRIS 409-81 MCG/ACT SUSP 2 sprays per nostril 1-2 times daily. 06/16/22   Marcelyn Bruins, MD  SUMAtriptan (IMITREX) 100 MG tablet Take 1 tablet (100 mg total) by mouth at onset of migraine symptoms. May repeat in 1-2 hours if  persistent. (Max 200 mg per 24 hours) 12/08/22     tiZANidine (ZANAFLEX) 4 MG tablet Take 1 tablet (4 mg total) by mouth at bedtime. 11/20/22   Curatolo, Adam, DO  topiramate (TOPAMAX) 100 MG tablet Take 1 tablet (100 mg total) by mouth daily. 12/08/22     triamcinolone cream (KENALOG) 0.1 % Apply 1 Application topically 2 (two) times daily as needed. 11/17/22     Vitamin D, Ergocalciferol, 50000 units CAPS Take 1 capsule (50,000 Units) by mouth once a week. 12/08/22     dicyclomine (BENTYL) 10 MG capsule Take 1 capsule (10 mg total) by mouth 4 (four) times daily -  before meals and at bedtime. For stomach cramping Patient not taking: Reported on 10/25/2019 06/13/19 10/25/19  [name], Hewitt Shorts, MD  sucralfate (CARAFATE) 1 GM/10ML suspension Take 10 mLs (1 g total) by mouth 4 (four) times daily -  with meals and at bedtime. Patient not taking: Reported on 10/25/2019 06/13/19 10/25/19  Cain Saupe, MD      Allergies    Latex, Other, Zofran [ondansetron], and Tree extract    Review of Systems   Review of Systems  All other systems reviewed and are negative.   Physical Exam Updated Vital Signs BP (!) 142/100 (BP Location: Left Arm)   Pulse (!) 108   Temp 97.9 F (36.6 C)   Resp (!) 24   Ht 5\' 2"  (1.575 m)   Wt 125.5 kg   LMP 06/25/2023 (Approximate)   SpO2 100%   BMI 50.60 kg/m  Physical Exam Vitals and nursing note reviewed.  Constitutional:      General: She is not in acute distress.    Appearance: She is well-developed. She is not diaphoretic.  HENT:     Head: Normocephalic and atraumatic.  Cardiovascular:     Rate and Rhythm: Normal rate and regular rhythm.     Heart sounds: No murmur heard.    No friction rub. No gallop.  Pulmonary:     Effort: Pulmonary effort is normal. No respiratory distress.     Breath sounds: Normal breath sounds. No wheezing.  Abdominal:     General: Bowel sounds are normal. There is no distension.     Palpations: Abdomen is soft.     Tenderness: There is no  abdominal tenderness.  Musculoskeletal:        General: Normal range of motion.     Cervical back: Normal range of motion and neck supple.  Skin:    General: Skin is warm and dry.  Neurological:     General: No focal deficit present.     Mental Status: She is alert and oriented to person, place, and time.     ED Results / Procedures / Treatments   Labs (all labs  ordered are listed, but only abnormal results are displayed) Labs Reviewed - No data to display  EKG None  Radiology No results found.  Procedures Procedures    Medications Ordered in ED Medications - No data to display  ED Course/ Medical Decision Making/ A&P  Patient presenting with shortness of breath.  She has history of asthma and was just seen in urgent care and prescribed Zithromax and prednisone.  She was unable to fill this from the pharmacy and presents requesting a dose of each.  These will be prescribed and patient to be discharged.  At the time of my evaluation, patient was sleeping with oxygen saturations of 100%.  On exam she has clear lungs and is in no respiratory distress.  Final Clinical Impression(s) / ED Diagnoses Final diagnoses:  None    Rx / DC Orders ED Discharge Orders     None         Geoffery Lyons, MD 07/06/23 [phone]

## 2023-07-07 ENCOUNTER — Other Ambulatory Visit (HOSPITAL_COMMUNITY): Payer: Self-pay

## 2023-07-11 ENCOUNTER — Other Ambulatory Visit (HOSPITAL_COMMUNITY): Payer: Self-pay

## 2023-07-25 ENCOUNTER — Other Ambulatory Visit (HOSPITAL_COMMUNITY): Payer: Self-pay

## 2023-07-25 MED ORDER — OXYCODONE-ACETAMINOPHEN 5-325 MG PO TABS
1.0000 | ORAL_TABLET | Freq: Two times a day (BID) | ORAL | 0 refills | Status: AC | PRN
  Filled 2023-07-25: qty 20, 10d supply, fill #0

## 2023-08-08 ENCOUNTER — Other Ambulatory Visit (HOSPITAL_COMMUNITY): Payer: Self-pay

## 2023-08-08 MED ORDER — OXYCODONE-ACETAMINOPHEN 5-325 MG PO TABS
1.0000 | ORAL_TABLET | Freq: Two times a day (BID) | ORAL | 0 refills | Status: AC | PRN
  Filled 2023-08-08: qty 20, 10d supply, fill #0

## 2023-08-31 ENCOUNTER — Other Ambulatory Visit (HOSPITAL_COMMUNITY): Payer: Self-pay

## 2023-08-31 MED ORDER — OXYCODONE-ACETAMINOPHEN 5-325 MG PO TABS
1.0000 | ORAL_TABLET | Freq: Two times a day (BID) | ORAL | 0 refills | Status: AC | PRN
  Filled 2023-08-31: qty 20, 10d supply, fill #0

## 2023-09-20 ENCOUNTER — Other Ambulatory Visit (HOSPITAL_COMMUNITY): Payer: Self-pay

## 2023-09-20 MED ORDER — OXYCODONE-ACETAMINOPHEN 5-325 MG PO TABS
1.0000 | ORAL_TABLET | Freq: Two times a day (BID) | ORAL | 0 refills | Status: DC | PRN
Start: 2023-09-20 — End: 2023-10-07
  Filled 2023-09-20: qty 20, 10d supply, fill #0

## 2023-09-22 ENCOUNTER — Other Ambulatory Visit: Payer: Self-pay | Admitting: Internal Medicine

## 2023-09-22 ENCOUNTER — Other Ambulatory Visit (HOSPITAL_COMMUNITY): Payer: Self-pay

## 2023-09-22 ENCOUNTER — Other Ambulatory Visit: Payer: Self-pay

## 2023-09-22 ENCOUNTER — Encounter (HOSPITAL_COMMUNITY): Payer: Self-pay

## 2023-09-23 ENCOUNTER — Other Ambulatory Visit (HOSPITAL_COMMUNITY): Payer: Self-pay

## 2023-09-23 MED ORDER — ALBUTEROL SULFATE HFA 108 (90 BASE) MCG/ACT IN AERS
2.0000 | INHALATION_SPRAY | Freq: Four times a day (QID) | RESPIRATORY_TRACT | 5 refills | Status: DC | PRN
Start: 2023-09-23 — End: 2023-10-11
  Filled 2023-09-23 – 2023-10-05 (×2): qty 18, 25d supply, fill #0

## 2023-09-26 ENCOUNTER — Other Ambulatory Visit (HOSPITAL_COMMUNITY): Payer: Self-pay

## 2023-09-28 ENCOUNTER — Other Ambulatory Visit (HOSPITAL_COMMUNITY): Payer: Self-pay

## 2023-09-28 ENCOUNTER — Encounter (HOSPITAL_COMMUNITY): Payer: Self-pay

## 2023-09-30 ENCOUNTER — Other Ambulatory Visit (HOSPITAL_COMMUNITY): Payer: Self-pay

## 2023-10-01 ENCOUNTER — Other Ambulatory Visit: Payer: Self-pay

## 2023-10-01 ENCOUNTER — Emergency Department (HOSPITAL_BASED_OUTPATIENT_CLINIC_OR_DEPARTMENT_OTHER)
Admission: EM | Admit: 2023-10-01 | Discharge: 2023-10-01 | Disposition: A | Discharge status: Home / Self Care | Attending: Emergency Medicine | Admitting: Emergency Medicine

## 2023-10-01 DIAGNOSIS — Z91040 Latex allergy status: Secondary | ICD-10-CM | POA: Insufficient documentation

## 2023-10-01 DIAGNOSIS — N3090 Cystitis, unspecified without hematuria: Secondary | ICD-10-CM | POA: Diagnosis not present

## 2023-10-01 DIAGNOSIS — J45909 Unspecified asthma, uncomplicated: Secondary | ICD-10-CM | POA: Insufficient documentation

## 2023-10-01 DIAGNOSIS — R109 Unspecified abdominal pain: Secondary | ICD-10-CM | POA: Diagnosis present

## 2023-10-01 LAB — URINALYSIS, ROUTINE W REFLEX MICROSCOPIC
Bilirubin Urine: NEGATIVE
Glucose, UA: NEGATIVE mg/dL
Ketones, ur: NEGATIVE mg/dL
Nitrite: NEGATIVE
Specific Gravity, Urine: 1.036 — ABNORMAL HIGH (ref 1.005–1.030)
pH: 5.5 (ref 5.0–8.0)

## 2023-10-01 LAB — CBC
HCT: 35 % — ABNORMAL LOW (ref 36.0–46.0)
Hemoglobin: 11 g/dL — ABNORMAL LOW (ref 12.0–15.0)
MCH: 25.4 pg — ABNORMAL LOW (ref 26.0–34.0)
MCHC: 31.4 g/dL (ref 30.0–36.0)
MCV: 80.8 fL (ref 80.0–100.0)
Platelets: 361 10*3/uL (ref 150–400)
RBC: 4.33 MIL/uL (ref 3.87–5.11)
RDW: 14.5 % (ref 11.5–15.5)
WBC: 9.9 10*3/uL (ref 4.0–10.5)
nRBC: 0 % (ref 0.0–0.2)

## 2023-10-01 LAB — COMPREHENSIVE METABOLIC PANEL WITH GFR
ALT: 8 U/L (ref 0–44)
AST: 16 U/L (ref 15–41)
Albumin: 3.7 g/dL (ref 3.5–5.0)
Alkaline Phosphatase: 74 U/L (ref 38–126)
Anion gap: 11 (ref 5–15)
BUN: 13 mg/dL (ref 6–20)
CO2: 24 mmol/L (ref 22–32)
Calcium: 9.3 mg/dL (ref 8.9–10.3)
Chloride: 102 mmol/L (ref 98–111)
Creatinine, Ser: 0.84 mg/dL (ref 0.44–1.00)
GFR, Estimated: >60 mL/min (ref >60)
Glucose, Bld: 93 mg/dL (ref 70–99)
Potassium: 4.2 mmol/L (ref 3.5–5.1)
Sodium: 137 mmol/L (ref 135–145)
Total Bilirubin: <0.2 mg/dL (ref 0.0–1.2)
Total Protein: 7.4 g/dL (ref 6.5–8.1)

## 2023-10-01 LAB — LIPASE, BLOOD: Lipase: 28 U/L (ref 11–51)

## 2023-10-01 LAB — PREGNANCY, URINE: Preg Test, Ur: NEGATIVE

## 2023-10-01 MED ORDER — KETOROLAC TROMETHAMINE 15 MG/ML IJ SOLN
15.0000 mg | Freq: Once | INTRAMUSCULAR | Status: AC
  Administered 2023-10-01: 15 mg via INTRAVENOUS
  Filled 2023-10-01: qty 1

## 2023-10-01 MED ORDER — SODIUM CHLORIDE 0.9 % IV SOLN
1.0000 g | Freq: Once | INTRAVENOUS | Status: AC
  Administered 2023-10-01: 1 g via INTRAVENOUS
  Filled 2023-10-01: qty 10

## 2023-10-01 MED ORDER — CEPHALEXIN 500 MG PO CAPS
500.0000 mg | ORAL_CAPSULE | Freq: Four times a day (QID) | ORAL | 0 refills | Status: DC
  Filled 2023-10-01: qty 28, 7d supply, fill #0

## 2023-10-01 MED ORDER — CEPHALEXIN 500 MG PO CAPS: 500.0000 mg | ORAL_CAPSULE | Freq: Four times a day (QID) | ORAL | 0 refills | Status: DC

## 2023-10-01 NOTE — Discharge Instructions (Addendum)
 While you were in the emergency room, you are diagnosed with a urinary tract infection.  I sent your prescription for a medication called Keflex .  You can take this 4 times per day for the next 1 week.  Return to the emergency room if you start to develop fever, inability eat or drink, pain on your sides.  With your PCP.

## 2023-10-01 NOTE — ED Triage Notes (Signed)
 Pt POV increased urinary frequency and abd pain x3 days, denies fever or n/v.

## 2023-10-01 NOTE — ED Provider Notes (Signed)
 [place] EMERGENCY DEPARTMENT AT [facility] Provider Note  CSN: [id number] Arrival date & time: 10/01/23 2015  Chief Complaint(s) Abdominal Pain  HPI Laurie Reed is a 38 y.o. female who is here today for increased urinary frequency and abdominal pain.  She has no history of kidney stones.   Past Medical History Past Medical History:  Diagnosis Date   Acid reflux 2004   Anemia    Anxiety 2008   Asthma 1993   no previous intubations or hospitalizations    Carpal tunnel syndrome    Chlamydia    Depression 2008   no meds, currenly ok   Eczema    Environmental allergies    GERD (gastroesophageal reflux disease)    Migraines    Miscarriage    Ovarian cyst    Pre-diabetes    Ulcer of the stomach and intestine 2004   Urinary tract infection    Patient Active Problem List   Diagnosis Date Noted   Asthmatic bronchitis with acute exacerbation 04/26/2022   Moderate persistent asthma 11/27/2021   GERD (gastroesophageal reflux disease) 11/27/2021   Normocytic anemia 11/27/2021   Acute severe exacerbation of moderate persistent allergic asthma 11/24/2021   Extensor tenosynovitis of wrist 04/30/2021   Carpal tunnel syndrome of left wrist 07/18/2020   Carpal tunnel syndrome of right wrist 07/18/2020   Chronic low back pain 12/26/2019   Anemia in pregnancy 01/23/2018   Morbid obesity with BMI of 45.0-49.9, adult (HCC) 10/19/2017   Seasonal allergic rhinitis 11/01/2016   Allergic asthma 06/30/2015   Vitamin D  deficiency 01/22/2015   Anxiety and depression 01/21/2015   Home Medication(s) Prior to Admission medications   Medication Sig Start Date End Date Taking? Authorizing Provider  cephALEXin  (KEFLEX ) 500 MG capsule Take 1 capsule (500 mg total) by mouth 4 (four) times daily. 10/01/23  Yes Afton Horse T, DO  acetaminophen  (TYLENOL ) 500 MG tablet Take 500-1,000 mg by mouth every 6 (six) hours as needed for mild pain, moderate pain or headache (or migraines).      [provider]  albuterol  (PROVENTIL ) (2.5 MG/3ML) 0.083% nebulizer solution Use 1 vial via nebulizer every 6 hours and as needed Patient taking differently: Take 2.5 mg by nebulization every 6 (six) hours. Shortness of breath 05/15/21     albuterol  (VENTOLIN  HFA) 108 (90 Base) MCG/ACT inhaler Inhale 2 puffs into the lungs 4 (four) times daily as needed. 01/28/23     albuterol  (VENTOLIN  HFA) 108 (90 Base) MCG/ACT inhaler Inhale 1-2 puffs into the lungs every 6 (six) hours as needed for wheezing or shortness of breath. 07/05/23   Mecum, Erin E, PA-C  albuterol  (VENTOLIN  HFA) 108 (90 Base) MCG/ACT inhaler Inhale 2 puffs into the lungs 4 (four) times daily as needed. 09/23/23     amitriptyline  (ELAVIL ) 25 MG tablet Take 1 tablet (25 mg total) by mouth at bedtime as needed. 12/08/22     calcium carbonate (TUMS - DOSED IN MG ELEMENTAL CALCIUM) 500 MG chewable tablet Chew 1-3 tablets by mouth daily as needed for indigestion or heartburn.    [provider]  cetirizine  (ZYRTEC ) 10 MG tablet Take 1 tablet (10 mg total) by mouth every evening as needed for allergies. 12/08/22     diphenhydrAMINE  (BENADRYL ) 25 mg capsule Take 25-50 mg by mouth 2 (two) times daily as needed for allergies.    [provider]  ferrous sulfate  324 (65 Fe) MG TBEC 1 pill daily after meal to help with anemia 11/22/18   [name], [name],  MD  fluticasone -salmeterol (ADVAIR  DISKUS) 500-50 MCG/ACT AEPB Inhale 1 puff into the lungs in the morning and at bedtime. 01/29/23   Aleck Hurdle, MD  gabapentin  (NEURONTIN ) 100 MG capsule Take 1 capsule (100 mg total) by mouth 3 (three) times daily. 04/21/22     ipratropium-albuterol  (DUONEB) 0.5-2.5 (3) MG/3ML SOLN Take 3 mLs by nebulization every 6 (six) hours as needed. 07/05/23   Mecum, Erin E, PA-C  levocetirizine (XYZAL ) 5 MG tablet Take 1 tablet (5 mg total) by mouth every evening. 06/16/22   Padgett, Rhoderick Ceo, MD  meloxicam  (MOBIC ) 15 MG tablet Take 1 tablet (15 mg  total) by mouth daily. Patient taking differently: Take 15 mg by mouth as needed. 09/14/22     montelukast  (SINGULAIR ) 10 MG tablet Take 1 tablet (10 mg total) by mouth daily. 12/08/22     omeprazole  (PRILOSEC) 40 MG capsule Take 1 capsule (40 mg total) by mouth daily. 12/24/21 12/24/22  Aleck Hurdle, MD  oxyCODONE -acetaminophen  (PERCOCET/ROXICET) 5-325 MG tablet Take 1 tablet by mouth 2 (two) times daily as needed for 10 days. 09/20/23     phentermine  (ADIPEX-P ) 37.5 MG tablet Take 1 tablet (37.5 mg total) by mouth in the morning. 12/08/22     RYALTRIS  665-25 MCG/ACT SUSP 2 sprays per nostril 1-2 times daily. 06/16/22   Brian Campanile, MD  SUMAtriptan  (IMITREX ) 100 MG tablet Take 1 tablet (100 mg total) by mouth at onset of migraine symptoms. May repeat in 1-2 hours if persistent. (Max 200 mg per 24 hours) 12/08/22     tiZANidine  (ZANAFLEX ) 4 MG tablet Take 1 tablet (4 mg total) by mouth at bedtime. 11/20/22   Curatolo, Adam, DO  topiramate  (TOPAMAX ) 100 MG tablet Take 1 tablet (100 mg total) by mouth daily. 12/08/22     triamcinolone  cream (KENALOG ) 0.1 % Apply 1 Application topically 2 (two) times daily as needed. 11/17/22     Vitamin D , Ergocalciferol , 50000 units CAPS Take 1 capsule (50,000 Units) by mouth once a week. 12/08/22     dicyclomine  (BENTYL ) 10 MG capsule Take 1 capsule (10 mg total) by mouth 4 (four) times daily -  before meals and at bedtime. For stomach cramping Patient not taking: Reported on 10/25/2019 06/13/19 10/25/19  [name], Margy Shin, MD  sucralfate  (CARAFATE ) 1 GM/10ML suspension Take 10 mLs (1 g total) by mouth 4 (four) times daily -  with meals and at bedtime. Patient not taking: Reported on 10/25/2019 06/13/19 10/25/19  Berneda Bridges, MD                                                                                                                                    Past Surgical History Past Surgical History:  Procedure Laterality Date   CARPAL TUNNEL RELEASE Left    COLONOSCOPY   2004   IRRIGATION AND DEBRIDEMENT SEBACEOUS CYST N/A 06/01/2017   Procedure: EXCISION SEBACEOUS CYST ON SCALP;  Surgeon:  [name], MD;  Location: [facility] ORS;  Service: General;  Laterality: N/A;   MULTIPLE TOOTH EXTRACTIONS  2015   6 teeth removed    TUBAL LIGATION N/A 04/04/2018   Procedure: POST PARTUM TUBAL LIGATION;  Surgeon: [name], DO;  Location: [facility];  Service: Gynecology;  Laterality: N/A;   TYMPANOSTOMY TUBE PLACEMENT     WISDOM TOOTH EXTRACTION  2007   Family History Family History  Problem Relation Age of Onset   Asthma Mother    Heart disease Mother    Hypertension Mother    Clotting disorder Mother    Diabetes Mother    Osteoarthritis Mother    Other Mother        DDD   High Cholesterol Mother    Anemia Mother    Healthy Brother    Asthma Maternal Aunt    Hypertension Maternal Aunt    Diabetes Maternal Aunt    Asthma Maternal Uncle    Hypertension Maternal Uncle    Diabetes Maternal Uncle    Diabetes Maternal Uncle    Heart disease Maternal Uncle    Diabetes Maternal Uncle    Cancer Paternal Aunt    Heart disease Maternal Grandmother    Diabetes Maternal Grandmother    Hypertension Maternal Grandmother    Heart disease Maternal Grandfather    Diabetes Maternal Grandfather    Hypertension Maternal Grandfather    Hypertension Paternal Grandmother    Hypertension Son    ADD / ADHD Son    Asthma Son    Allergies Son    Allergies Son    Asthma Son    GER disease Son    Allergies Daughter    Asthma Daughter    Migraines Daughter    Allergies Daughter    Migraines Daughter    Asthma Daughter    Hypotension Neg Hx    Anesthesia problems Neg Hx    Malignant hyperthermia Neg Hx    Pseudochol deficiency Neg Hx     Social History Social History   Tobacco Use   Smoking status: Never    Passive exposure: Never   Smokeless tobacco: Never  Vaping Use   Vaping status: Never Used  Substance Use Topics   Alcohol use: No     Alcohol/week: 0.0 standard drinks of alcohol   Drug use: No   Allergies Latex, Other, Zofran  [ondansetron ], and Tree extract  Review of Systems Review of Systems  Physical Exam Vital Signs  I have reviewed the triage vital signs BP (!) 107/59   Pulse 80   Temp 98.6 F (37 C) (Oral)   Resp 18   Ht 5\' 2"  (1.575 m)   Wt 127 kg   LMP 09/12/2023 (Approximate)   SpO2 100%   BMI 51.21 kg/m   Physical Exam Vitals reviewed.  Abdominal:     General: Abdomen is flat.     Palpations: Abdomen is soft.     Tenderness: There is no abdominal tenderness. There is no right CVA tenderness or left CVA tenderness.  Neurological:     Mental Status: She is alert.     ED Results and Treatments Labs (all labs ordered are listed, but only abnormal results are displayed) Labs Reviewed  CBC - Abnormal; Notable for the following components:      Result Value   Hemoglobin 11.0 (*)    HCT 35.0 (*)    MCH 25.4 (*)    All other components within normal limits  URINALYSIS, ROUTINE  W REFLEX MICROSCOPIC - Abnormal; Notable for the following components:   Specific Gravity, Urine 1.036 (*)    Hgb urine dipstick MODERATE (*)    Protein, ur TRACE (*)    Leukocytes,Ua LARGE (*)    Bacteria, UA RARE (*)    All other components within normal limits  LIPASE, BLOOD  COMPREHENSIVE METABOLIC PANEL WITH GFR  PREGNANCY, URINE                                                                                                                          Radiology No results found.  Pertinent labs & imaging results that were available during my care of the patient were reviewed by me and considered in my medical decision making (see MDM for details).  Medications Ordered in ED Medications  cefTRIAXone  (ROCEPHIN ) 1 g in sodium chloride  0.9 % 100 mL IVPB (1 g Intravenous New Bag/Given 10/01/23 2233)  ketorolac  (TORADOL ) 15 MG/ML injection 15 mg (has no administration in time range)                                                                                                                                      Procedures Procedures  (including critical care time)  Medical Decision Making / ED Course   This patient presents to the ED for concern of dysuria, this involves an extensive number of treatment options, and is a complaint that carries with it a high risk of complications and morbidity.  The differential diagnosis includes cystitis, pyonephritis, less likely nephrolithiasis.  MDM: Patient symptoms are consistent with cystitis.  She does endorse increasing urinary frequency, dysuria.  Patient says that when she woke up this morning, she just lost control of her bladder.  This has not been a consistent issue, she has not had any bowel incontinence.  She has no back pain or leg weakness.  Patient denies any flank pain.  She has no CVA tenderness.  Less likely to be stone.  Do not believe she requires imaging given her exam and absence of history of stones.  Will treat the patient with Keflex , have her follow-up with her PCP.   Additional history obtained: -Additional history obtained from  -External records from outside source obtained and reviewed including: Chart review including previous notes, labs, imaging, consultation notes   Lab Tests: -I ordered, reviewed, and interpreted labs.   The pertinent results  include:   Labs Reviewed  CBC - Abnormal; Notable for the following components:      Result Value   Hemoglobin 11.0 (*)    HCT 35.0 (*)    MCH 25.4 (*)    All other components within normal limits  URINALYSIS, ROUTINE W REFLEX MICROSCOPIC - Abnormal; Notable for the following components:   Specific Gravity, Urine 1.036 (*)    Hgb urine dipstick MODERATE (*)    Protein, ur TRACE (*)    Leukocytes,Ua LARGE (*)    Bacteria, UA RARE (*)    All other components within normal limits  LIPASE, BLOOD  COMPREHENSIVE METABOLIC PANEL WITH GFR  PREGNANCY, URINE     Imaging  Studies ordered: I ordered imaging studies including  I independently visualized and interpreted imaging. I agree with the radiologist interpretation   Medicines ordered and prescription drug management: Meds ordered this encounter  Medications   cefTRIAXone  (ROCEPHIN ) 1 g in sodium chloride  0.9 % 100 mL IVPB    Antibiotic Indication::   UTI   ketorolac  (TORADOL ) 15 MG/ML injection 15 mg   cephALEXin  (KEFLEX ) 500 MG capsule    Sig: Take 1 capsule (500 mg total) by mouth 4 (four) times daily.    Dispense:  28 capsule    Refill:  0    -I have reviewed the patients home medicines and have made adjustments as needed  Cardiac Monitoring: The patient was maintained on a cardiac monitor.  I personally viewed and interpreted the cardiac monitored which showed an underlying rhythm of: Sinus rhythm  Social Determinants of Health:  Factors impacting patients care include: Lack of access to primary care   Reevaluation: After the interventions noted above, I reevaluated the patient and found that they have :improved  Co morbidities that complicate the patient evaluation  Past Medical History:  Diagnosis Date   Acid reflux 2004   Anemia    Anxiety 2008   Asthma 1993   no previous intubations or hospitalizations    Carpal tunnel syndrome    Chlamydia    Depression 2008   no meds, currenly ok   Eczema    Environmental allergies    GERD (gastroesophageal reflux disease)    Migraines    Miscarriage    Ovarian cyst    Pre-diabetes    Ulcer of the stomach and intestine 2004   Urinary tract infection       Dispostion: I considered admission for this patient, however the patient is appropriate for outpatient follow-up and treatment for cystitis.     Final Clinical Impression(s) / ED Diagnoses Final diagnoses:  Cystitis     @PCDICTATION @    Afton Horse T, DO 10/01/23 2247

## 2023-10-03 ENCOUNTER — Other Ambulatory Visit (HOSPITAL_COMMUNITY): Payer: Self-pay

## 2023-10-04 ENCOUNTER — Other Ambulatory Visit (HOSPITAL_COMMUNITY): Payer: Self-pay

## 2023-10-05 ENCOUNTER — Other Ambulatory Visit (HOSPITAL_COMMUNITY): Payer: Self-pay

## 2023-10-06 ENCOUNTER — Other Ambulatory Visit (HOSPITAL_COMMUNITY): Payer: Self-pay

## 2023-10-06 MED ORDER — CEPHALEXIN 500 MG PO CAPS
500.0000 mg | ORAL_CAPSULE | Freq: Four times a day (QID) | ORAL | 0 refills | Status: DC
  Filled 2023-10-06: qty 28, 7d supply, fill #0

## 2023-10-07 ENCOUNTER — Other Ambulatory Visit (HOSPITAL_COMMUNITY): Payer: Self-pay

## 2023-10-07 MED ORDER — OXYCODONE-ACETAMINOPHEN 5-325 MG PO TABS
1.0000 | ORAL_TABLET | ORAL | 0 refills | Status: DC | PRN
  Filled 2023-10-14: qty 25, 5d supply, fill #0

## 2023-10-07 MED ORDER — ALBUTEROL SULFATE HFA 108 (90 BASE) MCG/ACT IN AERS
1.0000 | INHALATION_SPRAY | Freq: Four times a day (QID) | RESPIRATORY_TRACT | 0 refills | Status: DC | PRN
  Filled 2023-10-07: qty 18, 25d supply, fill #0

## 2023-10-09 ENCOUNTER — Emergency Department (HOSPITAL_BASED_OUTPATIENT_CLINIC_OR_DEPARTMENT_OTHER)

## 2023-10-09 ENCOUNTER — Other Ambulatory Visit: Payer: Self-pay

## 2023-10-09 ENCOUNTER — Observation Stay (HOSPITAL_BASED_OUTPATIENT_CLINIC_OR_DEPARTMENT_OTHER)
Admission: EM | Admit: 2023-10-09 | Discharge: 2023-10-11 | Disposition: A | Discharge status: Home / Self Care | Attending: Family Medicine | Admitting: Family Medicine

## 2023-10-09 DIAGNOSIS — J45901 Unspecified asthma with (acute) exacerbation: Secondary | ICD-10-CM | POA: Diagnosis present

## 2023-10-09 DIAGNOSIS — J45909 Unspecified asthma, uncomplicated: Principal | ICD-10-CM | POA: Diagnosis present

## 2023-10-09 DIAGNOSIS — Z6841 Body Mass Index (BMI) 40.0 and over, adult: Secondary | ICD-10-CM | POA: Insufficient documentation

## 2023-10-09 DIAGNOSIS — Z91040 Latex allergy status: Secondary | ICD-10-CM | POA: Diagnosis not present

## 2023-10-09 DIAGNOSIS — R0789 Other chest pain: Secondary | ICD-10-CM | POA: Diagnosis not present

## 2023-10-09 DIAGNOSIS — J4540 Moderate persistent asthma, uncomplicated: Secondary | ICD-10-CM | POA: Diagnosis present

## 2023-10-09 DIAGNOSIS — J302 Other seasonal allergic rhinitis: Secondary | ICD-10-CM | POA: Diagnosis present

## 2023-10-09 DIAGNOSIS — R059 Cough, unspecified: Secondary | ICD-10-CM | POA: Insufficient documentation

## 2023-10-09 DIAGNOSIS — Z7951 Long term (current) use of inhaled steroids: Secondary | ICD-10-CM | POA: Insufficient documentation

## 2023-10-09 DIAGNOSIS — D649 Anemia, unspecified: Secondary | ICD-10-CM | POA: Diagnosis present

## 2023-10-09 DIAGNOSIS — D500 Iron deficiency anemia secondary to blood loss (chronic): Secondary | ICD-10-CM | POA: Diagnosis not present

## 2023-10-09 DIAGNOSIS — J4541 Moderate persistent asthma with (acute) exacerbation: Principal | ICD-10-CM | POA: Insufficient documentation

## 2023-10-09 DIAGNOSIS — R0602 Shortness of breath: Secondary | ICD-10-CM | POA: Diagnosis present

## 2023-10-09 LAB — CBC WITH DIFFERENTIAL/PLATELET
Abs Immature Granulocytes: 0.03 10*3/uL (ref 0.00–0.07)
Basophils Absolute: 0 10*3/uL (ref 0.0–0.1)
Basophils Relative: 1 %
Eosinophils Absolute: 0.4 10*3/uL (ref 0.0–0.5)
Eosinophils Relative: 5 %
HCT: 35.2 % — ABNORMAL LOW (ref 36.0–46.0)
Hemoglobin: 11.1 g/dL — ABNORMAL LOW (ref 12.0–15.0)
Immature Granulocytes: 0 %
Lymphocytes Relative: 37 %
Lymphs Abs: 3 10*3/uL (ref 0.7–4.0)
MCH: 25.6 pg — ABNORMAL LOW (ref 26.0–34.0)
MCHC: 31.5 g/dL (ref 30.0–36.0)
MCV: 81.3 fL (ref 80.0–100.0)
Monocytes Absolute: 0.9 10*3/uL (ref 0.1–1.0)
Monocytes Relative: 11 %
Neutro Abs: 3.6 10*3/uL (ref 1.7–7.7)
Neutrophils Relative %: 46 %
Platelets: 335 10*3/uL (ref 150–400)
RBC: 4.33 MIL/uL (ref 3.87–5.11)
RDW: 14.6 % (ref 11.5–15.5)
WBC: 7.9 10*3/uL (ref 4.0–10.5)
nRBC: 0 % (ref 0.0–0.2)

## 2023-10-09 LAB — BASIC METABOLIC PANEL WITH GFR
Anion gap: 13 (ref 5–15)
BUN: 9 mg/dL (ref 6–20)
CO2: 23 mmol/L (ref 22–32)
Calcium: 9.4 mg/dL (ref 8.9–10.3)
Chloride: 102 mmol/L (ref 98–111)
Creatinine, Ser: 0.8 mg/dL (ref 0.44–1.00)
GFR, Estimated: >60 mL/min (ref >60)
Glucose, Bld: 89 mg/dL (ref 70–99)
Potassium: 3.7 mmol/L (ref 3.5–5.1)
Sodium: 138 mmol/L (ref 135–145)

## 2023-10-09 LAB — D-DIMER, QUANTITATIVE: D-Dimer, Quant: 0.43 ug{FEU}/mL (ref 0.00–0.50)

## 2023-10-09 LAB — RESP PANEL BY RT-PCR (RSV, FLU A&B, COVID)  RVPGX2
Influenza A by PCR: NEGATIVE
Influenza B by PCR: NEGATIVE
Resp Syncytial Virus by PCR: NEGATIVE
SARS Coronavirus 2 by RT PCR: NEGATIVE

## 2023-10-09 MED ORDER — OXYCODONE HCL 5 MG PO TABS
5.0000 mg | ORAL_TABLET | Freq: Four times a day (QID) | ORAL | Status: DC | PRN
  Administered 2023-10-09 – 2023-10-10 (×3): 5 mg via ORAL
  Filled 2023-10-09 (×3): qty 1

## 2023-10-09 MED ORDER — IPRATROPIUM-ALBUTEROL 0.5-2.5 (3) MG/3ML IN SOLN
3.0000 mL | RESPIRATORY_TRACT | Status: DC | PRN
  Administered 2023-10-11: 3 mL via RESPIRATORY_TRACT
  Filled 2023-10-09: qty 3

## 2023-10-09 MED ORDER — IPRATROPIUM-ALBUTEROL 0.5-2.5 (3) MG/3ML IN SOLN
3.0000 mL | RESPIRATORY_TRACT | Status: DC
  Administered 2023-10-09 – 2023-10-10 (×8): 3 mL via RESPIRATORY_TRACT
  Filled 2023-10-09 (×8): qty 3

## 2023-10-09 MED ORDER — ALBUTEROL SULFATE (2.5 MG/3ML) 0.083% IN NEBU
2.5000 mg | INHALATION_SOLUTION | Freq: Once | RESPIRATORY_TRACT | Status: DC
  Filled 2023-10-09: qty 3

## 2023-10-09 MED ORDER — BROMPHENIRAMINE-PHENYLEPHRINE 2-5 MG/10ML PO LIQD: 10.0000 mL | Freq: Four times a day (QID) | ORAL | Status: DC | PRN

## 2023-10-09 MED ORDER — ACETAMINOPHEN 650 MG RE SUPP: 650.0000 mg | Freq: Four times a day (QID) | RECTAL | Status: DC | PRN

## 2023-10-09 MED ORDER — IPRATROPIUM-ALBUTEROL 0.5-2.5 (3) MG/3ML IN SOLN
RESPIRATORY_TRACT | Status: AC
  Administered 2023-10-09: 3 mL via RESPIRATORY_TRACT
  Filled 2023-10-09: qty 3

## 2023-10-09 MED ORDER — ONDANSETRON HCL 4 MG PO TABS: 4.0000 mg | ORAL_TABLET | Freq: Four times a day (QID) | ORAL | Status: DC | PRN

## 2023-10-09 MED ORDER — FAMOTIDINE 20 MG PO TABS
20.0000 mg | ORAL_TABLET | Freq: Two times a day (BID) | ORAL | Status: DC
  Administered 2023-10-09 – 2023-10-11 (×5): 20 mg via ORAL
  Filled 2023-10-09 (×5): qty 1

## 2023-10-09 MED ORDER — ENOXAPARIN SODIUM 40 MG/0.4ML IJ SOSY
40.0000 mg | PREFILLED_SYRINGE | Freq: Every day | INTRAMUSCULAR | Status: DC
  Administered 2023-10-09 – 2023-10-11 (×3): 40 mg via SUBCUTANEOUS
  Filled 2023-10-09 (×3): qty 0.4

## 2023-10-09 MED ORDER — ALBUTEROL SULFATE (2.5 MG/3ML) 0.083% IN NEBU
INHALATION_SOLUTION | RESPIRATORY_TRACT | Status: AC
  Filled 2023-10-09: qty 12

## 2023-10-09 MED ORDER — ALBUTEROL SULFATE (2.5 MG/3ML) 0.083% IN NEBU
2.5000 mg | INHALATION_SOLUTION | Freq: Once | RESPIRATORY_TRACT | Status: AC
  Administered 2023-10-09: 10 mg via RESPIRATORY_TRACT

## 2023-10-09 MED ORDER — METHYLPREDNISOLONE SODIUM SUCC 125 MG IJ SOLR
125.0000 mg | INTRAMUSCULAR | Status: AC
  Administered 2023-10-09: 125 mg via INTRAVENOUS
  Filled 2023-10-09: qty 2

## 2023-10-09 MED ORDER — SODIUM CHLORIDE 0.9 % IV SOLN
100.0000 mg | Freq: Two times a day (BID) | INTRAVENOUS | Status: DC
  Administered 2023-10-09 – 2023-10-10 (×3): 100 mg via INTRAVENOUS
  Filled 2023-10-09 (×3): qty 100

## 2023-10-09 MED ORDER — METHYLPREDNISOLONE SODIUM SUCC 125 MG IJ SOLR
80.0000 mg | Freq: Two times a day (BID) | INTRAMUSCULAR | Status: DC
  Administered 2023-10-09 – 2023-10-11 (×4): 80 mg via INTRAVENOUS
  Filled 2023-10-09 (×4): qty 2

## 2023-10-09 MED ORDER — ONDANSETRON HCL 4 MG/2ML IJ SOLN
4.0000 mg | Freq: Four times a day (QID) | INTRAMUSCULAR | Status: DC | PRN
  Filled 2023-10-09: qty 2

## 2023-10-09 MED ORDER — ACETAMINOPHEN 325 MG PO TABS
650.0000 mg | ORAL_TABLET | Freq: Four times a day (QID) | ORAL | Status: DC | PRN
  Administered 2023-10-09 – 2023-10-11 (×3): 650 mg via ORAL
  Filled 2023-10-09 (×3): qty 2

## 2023-10-09 MED ORDER — ALBUTEROL SULFATE (2.5 MG/3ML) 0.083% IN NEBU
INHALATION_SOLUTION | RESPIRATORY_TRACT | Status: AC
  Filled 2023-10-09: qty 3

## 2023-10-09 MED ORDER — MAGNESIUM SULFATE 2 GM/50ML IV SOLN
2.0000 g | Freq: Once | INTRAVENOUS | Status: AC
  Administered 2023-10-09: 2 g via INTRAVENOUS
  Filled 2023-10-09: qty 50

## 2023-10-09 MED ORDER — IPRATROPIUM-ALBUTEROL 0.5-2.5 (3) MG/3ML IN SOLN
3.0000 mL | Freq: Once | RESPIRATORY_TRACT | Status: AC
  Administered 2023-10-09: 3 mL via RESPIRATORY_TRACT
  Filled 2023-10-09: qty 3

## 2023-10-09 MED ORDER — IPRATROPIUM-ALBUTEROL 0.5-2.5 (3) MG/3ML IN SOLN
3.0000 mL | Freq: Once | RESPIRATORY_TRACT | Status: AC
Start: 2023-10-09 — End: 2023-10-09

## 2023-10-09 NOTE — ED Triage Notes (Signed)
 Pt caox4, ambulatory c/o SOB, nonproductive cough, and wheezing x3 days. Minimal relief with rescue inhaler. Denies fever. Pt also c/o CP and pain down L leg.

## 2023-10-09 NOTE — ED Notes (Signed)
 I have just given report to Maureen Sour, Charity fundraiser at [employer]. [facility] leaves with her at this time. Pt. Is in no distress.

## 2023-10-09 NOTE — ED Notes (Signed)
 Called Taryn at [facility] for transport

## 2023-10-09 NOTE — Progress Notes (Signed)
 Plan of Care Note for accepted transfer  Patient: Laurie Reed    ZOX:[id number]  DOA: 10/09/2023     Facility requesting transfer: [facility] Requesting Provider: Dr Leighton Punches Reason for transfer: Asthma Exacerbation Facility course: Complains of shortness of breath over last 5 days.Patient was persistently tachycardic and short of breath even after nebulization. No obvious pneumonia.  Plan of care: The patient is accepted for admission to Telemetry unit, at [facility].   Author: Beatris Bough, MD  10/09/2023  Check [email or web address] for on-call coverage.  Nursing staff, Please call [facility] Admits & Consults System-Wide number on Amion as soon as patient's arrival, so appropriate admitting provider can evaluate the pt.

## 2023-10-09 NOTE — ED Provider Notes (Signed)
 [place] [facility]MENT AT [facility] Provider Note   CSN: [id number] Arrival date & time: 10/09/23  0825     History  Chief Complaint  Patient presents with   Shortness of Breath    Laurie Reed is a 38 y.o. female.  38 year old female with history of asthma who presents with shortness of breath x 3 days.  Notes increased dyspnea exertion.  Patient has been hospitalized in ICU for asthma exacerbation past.  Has had increasing cough with no fever or chills.  No vomiting or diarrhea.  Some chest tightness.  Unsure if she has had a DVT before in the past.  Has been using her albuterol  with limited relief.       Home Medications Prior to Admission medications   Medication Sig Start Date End Date Taking? Authorizing Provider  acetaminophen  (TYLENOL ) 500 MG tablet Take 500-1,000 mg by mouth every 6 (six) hours as needed for mild pain, moderate pain or headache (or migraines).     [provider]  albuterol  (PROVENTIL ) (2.5 MG/3ML) 0.083% nebulizer solution Use 1 vial via nebulizer every 6 hours and as needed Patient taking differently: Take 2.5 mg by nebulization every 6 (six) hours. Shortness of breath 05/15/21     albuterol  (VENTOLIN  HFA) 108 (90 Base) MCG/ACT inhaler Inhale 2 puffs into the lungs 4 (four) times daily as needed. 01/28/23     albuterol  (VENTOLIN  HFA) 108 (90 Base) MCG/ACT inhaler Inhale 1-2 puffs into the lungs every 6 (six) hours as needed for wheezing or shortness of breath. 07/05/23   Mecum, Erin E, PA-C  albuterol  (VENTOLIN  HFA) 108 (90 Base) MCG/ACT inhaler Inhale 2 puffs into the lungs 4 (four) times daily as needed. 09/23/23     albuterol  (VENTOLIN  HFA) 108 (90 Base) MCG/ACT inhaler Inhale 1-2 puffs into the lungs every 6 (six) hours as needed for wheezing or shortness of breath.   . 07/05/23     amitriptyline  (ELAVIL ) 25 MG tablet Take 1 tablet (25 mg total) by mouth at bedtime as needed. 12/08/22     calcium carbonate (TUMS - DOSED IN MG  ELEMENTAL CALCIUM) 500 MG chewable tablet Chew 1-3 tablets by mouth daily as needed for indigestion or heartburn.    [provider]  cephALEXin  (KEFLEX ) 500 MG capsule Take 1 capsule (500 mg total) by mouth 4 (four) times daily. 10/01/23   Afton Horse T, DO  cephALEXin  (KEFLEX ) 500 MG capsule Take 1 capsule (500 mg total) by mouth 4 (four) times daily. 10/01/23   Afton Horse T, DO  cetirizine  (ZYRTEC ) 10 MG tablet Take 1 tablet (10 mg total) by mouth every evening as needed for allergies. 12/08/22     diphenhydrAMINE  (BENADRYL ) 25 mg capsule Take 25-50 mg by mouth 2 (two) times daily as needed for allergies.    [provider]  ferrous sulfate  324 (65 Fe) MG TBEC 1 pill daily after meal to help with anemia 11/22/18   [name], [name], MD  fluticasone -salmeterol (ADVAIR  DISKUS) 500-50 MCG/ACT AEPB Inhale 1 puff into the lungs in the morning and at bedtime. 01/29/23   Aleck Hurdle, MD  gabapentin  (NEURONTIN ) 100 MG capsule Take 1 capsule (100 mg total) by mouth 3 (three) times daily. 04/21/22     ipratropium-albuterol  (DUONEB) 0.5-2.5 (3) MG/3ML SOLN Take 3 mLs by nebulization every 6 (six) hours as needed. 07/05/23   Mecum, Erin E, PA-C  levocetirizine (XYZAL ) 5 MG tablet Take 1 tablet (5 mg total) by mouth every evening. 06/16/22  Brian Campanile, MD  meloxicam  (MOBIC ) 15 MG tablet Take 1 tablet (15 mg total) by mouth daily. Patient taking differently: Take 15 mg by mouth as needed. 09/14/22     montelukast  (SINGULAIR ) 10 MG tablet Take 1 tablet (10 mg total) by mouth daily. 12/08/22     omeprazole  (PRILOSEC) 40 MG capsule Take 1 capsule (40 mg total) by mouth daily. 12/24/21 12/24/22  Aleck Hurdle, MD  oxyCODONE -acetaminophen  (PERCOCET/ROXICET) 5-325 MG tablet Take 1 tablet every 4-6 hours by mouth as needed for pain for 5 days. 10/07/23     phentermine  (ADIPEX-P ) 37.5 MG tablet Take 1 tablet (37.5 mg total) by mouth in the morning. 12/08/22     RYALTRIS  665-25 MCG/ACT SUSP 2  sprays per nostril 1-2 times daily. 06/16/22   Brian Campanile, MD  SUMAtriptan  (IMITREX ) 100 MG tablet Take 1 tablet (100 mg total) by mouth at onset of migraine symptoms. May repeat in 1-2 hours if persistent. (Max 200 mg per 24 hours) 12/08/22     tiZANidine  (ZANAFLEX ) 4 MG tablet Take 1 tablet (4 mg total) by mouth at bedtime. 11/20/22   Curatolo, Adam, DO  topiramate  (TOPAMAX ) 100 MG tablet Take 1 tablet (100 mg total) by mouth daily. 12/08/22     triamcinolone  cream (KENALOG ) 0.1 % Apply 1 Application topically 2 (two) times daily as needed. 11/17/22     Vitamin D , Ergocalciferol , 50000 units CAPS Take 1 capsule (50,000 Units) by mouth once a week. 12/08/22     dicyclomine  (BENTYL ) 10 MG capsule Take 1 capsule (10 mg total) by mouth 4 (four) times daily -  before meals and at bedtime. For stomach cramping Patient not taking: Reported on 10/25/2019 06/13/19 10/25/19  [name], Margy Shin, MD  sucralfate  (CARAFATE ) 1 GM/10ML suspension Take 10 mLs (1 g total) by mouth 4 (four) times daily -  with meals and at bedtime. Patient not taking: Reported on 10/25/2019 06/13/19 10/25/19  Berneda Bridges, MD      Allergies    Latex, Other, Zofran  [ondansetron ], and Tree extract    Review of Systems   Review of Systems  All other systems reviewed and are negative.   Physical Exam Updated Vital Signs BP 132/68   Pulse (!) 128   Temp 98.8 F (37.1 C) (Oral)   Resp (!) 26   LMP 10/07/2023 (Exact Date)   SpO2 100%  Physical Exam Vitals and nursing note reviewed.  Constitutional:      General: She is not in acute distress.    Appearance: Normal appearance. She is well-developed. She is not toxic-appearing.  HENT:     Head: Normocephalic and atraumatic.  Eyes:     General: Lids are normal.     Conjunctiva/sclera: Conjunctivae normal.     Pupils: Pupils are equal, round, and reactive to light.  Neck:     Thyroid: No thyroid mass.     Trachea: No tracheal deviation.  Cardiovascular:     Rate and  Rhythm: Regular rhythm. Tachycardia present.     Heart sounds: Normal heart sounds. No murmur heard.    No gallop.  Pulmonary:     Effort: Tachypnea, prolonged expiration and respiratory distress present.     Breath sounds: No stridor. Examination of the right-upper field reveals decreased breath sounds. Examination of the left-upper field reveals decreased breath sounds. Decreased breath sounds present. No wheezing, rhonchi or rales.  Abdominal:     General: There is no distension.     Palpations: Abdomen is soft.  Tenderness: There is no abdominal tenderness. There is no rebound.  Musculoskeletal:        General: No tenderness. Normal range of motion.     Cervical back: Normal range of motion and neck supple.  Skin:    General: Skin is warm and dry.     Findings: No abrasion or rash.  Neurological:     Mental Status: She is alert and oriented to person, place, and time. Mental status is at baseline.     GCS: GCS eye subscore is 4. GCS verbal subscore is 5. GCS motor subscore is 6.     Cranial Nerves: No cranial nerve deficit.     Sensory: No sensory deficit.     Motor: Motor function is intact.  Psychiatric:        Attention and Perception: Attention normal.        Speech: Speech normal.        Behavior: Behavior normal.     ED Results / Procedures / Treatments   Labs (all labs ordered are listed, but only abnormal results are displayed) Labs Reviewed  CBC WITH DIFFERENTIAL/PLATELET  BASIC METABOLIC PANEL WITH GFR  D-DIMER, QUANTITATIVE    EKG EKG Interpretation Date/Time:  Sunday Oct 09 2023 08:40:42 EDT Ventricular Rate:  117 PR Interval:  142 QRS Duration:  99 QT Interval:  345 QTC Calculation: 482 R Axis:   32  Text Interpretation: Sinus tachycardia Low voltage, precordial leads RSR' in V1 or V2, right VCD or RVH Baseline wander in lead(s) II III aVF Confirmed by Lind Repine ([id number]) on 10/09/2023 8:44:03 AM  Radiology No results  found.  Procedures Procedures    Medications Ordered in ED Medications  ipratropium-albuterol  (DUONEB) 0.5-2.5 (3) MG/3ML nebulizer solution 3 mL (has no administration in time range)  albuterol  (PROVENTIL ) (2.5 MG/3ML) 0.083% nebulizer solution 2.5 mg (has no administration in time range)  ipratropium-albuterol  (DUONEB) 0.5-2.5 (3) MG/3ML nebulizer solution (has no administration in time range)  albuterol  (PROVENTIL ) (2.5 MG/3ML) 0.083% nebulizer solution (has no administration in time range)  methylPREDNISolone  sodium succinate (SOLU-MEDROL ) 125 mg/2 mL injection 125 mg (has no administration in time range)  magnesium  sulfate IVPB 2 g 50 mL (has no administration in time range)    ED Course/ Medical Decision Making/ A&P                                 Medical Decision Making Amount and/or Complexity of Data Reviewed Labs: ordered. Radiology: ordered.  Risk Prescription drug management.   Patient's EKG shows sinus tachycardia.  Chest x-ray without acute infiltrate.  Concern for possible PE and D-dimer negative.  Patient did have some wheezing which improved after albuterol  treatment.  She was also given magnesium  and Solu-Medrol .  Continues to be tachypneic.  Will require admission.  Informed patient and mother by phone  CRITICAL CARE Performed by: Eden Goodpasture Total critical care time: 45 minutes Critical care time was exclusive of separately billable procedures and treating other patients. Critical care was necessary to treat or prevent imminent or life-threatening deterioration. Critical care was time spent personally by me on the following activities: development of treatment plan with patient and/or surrogate as well as nursing, discussions with consultants, evaluation of patient's response to treatment, examination of patient, obtaining history from patient or surrogate, ordering and performing treatments and interventions, ordering and review of laboratory studies,  ordering and review of radiographic studies, pulse oximetry and  re-evaluation of patient's condition.         Final Clinical Impression(s) / ED Diagnoses Final diagnoses:  None    Rx / DC Orders ED Discharge Orders     None         Lind Repine, MD 10/09/23 [id number]

## 2023-10-09 NOTE — H&P (Addendum)
 History and Physical    Laurie Reed ZOX:[id number] DOB: June 09, 1985 DOA: 10/09/2023  DOS: the patient was seen and examined on 10/09/2023  PCP: Laurie Congo, MD   Patient coming from: Home  I have personally briefly reviewed patient's old medical records in [facility]  No notes on file   HPI: 38 year old female who has a history of moderate persistent asthma on inhaler and nebulization and steroid inhalation at home presented to ED for shortness of breath 3 days duration.  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  She had a increasing cough but denied any fever or chills.  Denied any nausea or vomiting or diarrhea.  She complained of some chest tightness with the cough. She was given Solu-Medrol  125 mg, nebulization and she was still coughing and short of breath and was transferred to [facility] for further evaluation. Patient was seen and examined at bedside.  Patient was constantly coughing but did not bring up any phlegm.  She stated that she has been feeling bad for the last 3 days.  She denies any fever or chills.  Review of Systems:  ROS Patient denies any fever nausea vomiting but she complains of chest discomfort while she is coughing.  Past Medical History:  Diagnosis Date   Acid reflux 2004   Anemia    Anxiety 2008   Asthma 1993   no previous intubations or hospitalizations    Carpal tunnel syndrome    Chlamydia    Depression 2008   no meds, currenly ok   Eczema    Environmental allergies    GERD (gastroesophageal reflux disease)    Migraines    Miscarriage    Ovarian cyst    Pre-diabetes    Ulcer of the stomach and intestine 2004   Urinary tract infection     Past Surgical History:  Procedure Laterality Date   CARPAL TUNNEL RELEASE Left    COLONOSCOPY  2004   IRRIGATION AND DEBRIDEMENT SEBACEOUS CYST N/A 06/01/2017   Procedure: EXCISION SEBACEOUS CYST ON  SCALP;  Surgeon: Laurie Isles, MD;  Location: [facility] ORS;  Service: General;  Laterality: N/A;   MULTIPLE TOOTH EXTRACTIONS  2015   6 teeth removed    TUBAL LIGATION N/A 04/04/2018   Procedure: POST PARTUM TUBAL LIGATION;  Surgeon: Laurie Sea, DO;  Location: [facility];  Service: Gynecology;  Laterality: N/A;   TYMPANOSTOMY TUBE PLACEMENT     WISDOM TOOTH EXTRACTION  2007     reports that she has never smoked. She has never been exposed to tobacco smoke. She has never used smokeless tobacco. She reports that she does not drink alcohol and does not use drugs.  Allergies  Allergen Reactions   Latex Hives and Itching   Other Itching    "20 different types of trees"   Zofran  [Ondansetron ] Itching   Tree Extract Itching    Family History  Problem Relation Age of Onset   Asthma Mother    Heart disease Mother    Hypertension Mother    Clotting disorder Mother    Diabetes Mother    Osteoarthritis Mother    Other Mother        DDD   High Cholesterol Mother    Anemia Mother    Healthy Brother    Asthma Maternal Aunt    Hypertension Maternal Aunt    Diabetes Maternal Aunt  Asthma Maternal Uncle    Hypertension Maternal Uncle    Diabetes Maternal Uncle    Diabetes Maternal Uncle    Heart disease Maternal Uncle    Diabetes Maternal Uncle    Cancer Paternal Aunt    Heart disease Maternal Grandmother    Diabetes Maternal Grandmother    Hypertension Maternal Grandmother    Heart disease Maternal Grandfather    Diabetes Maternal Grandfather    Hypertension Maternal Grandfather    Hypertension Paternal Grandmother    Hypertension Son    ADD / ADHD Son    Asthma Son    Allergies Son    Allergies Son    Asthma Son    GER disease Son    Allergies Daughter    Asthma Daughter    Migraines Daughter    Allergies Daughter    Migraines Daughter    Asthma Daughter    Hypotension Neg Hx    Anesthesia problems Neg Hx    Malignant hyperthermia Neg Hx     Pseudochol deficiency Neg Hx     Prior to Admission medications   Medication Sig Start Date End Date Taking? Authorizing Provider  acetaminophen  (TYLENOL ) 500 MG tablet Take 500-1,000 mg by mouth every 6 (six) hours as needed for mild pain, moderate pain or headache (or migraines).     [provider]  albuterol  (PROVENTIL ) (2.5 MG/3ML) 0.083% nebulizer solution Use 1 vial via nebulizer every 6 hours and as needed Patient taking differently: Take 2.5 mg by nebulization every 6 (six) hours. Shortness of breath 05/15/21     albuterol  (VENTOLIN  HFA) 108 (90 Base) MCG/ACT inhaler Inhale 2 puffs into the lungs 4 (four) times daily as needed. 01/28/23     albuterol  (VENTOLIN  HFA) 108 (90 Base) MCG/ACT inhaler Inhale 1-2 puffs into the lungs every 6 (six) hours as needed for wheezing or shortness of breath. 07/05/23   Mecum, Erin E, PA-C  albuterol  (VENTOLIN  HFA) 108 (90 Base) MCG/ACT inhaler Inhale 2 puffs into the lungs 4 (four) times daily as needed. 09/23/23     albuterol  (VENTOLIN  HFA) 108 (90 Base) MCG/ACT inhaler Inhale 1-2 puffs into the lungs every 6 (six) hours as needed for wheezing or shortness of breath.   . 07/05/23     amitriptyline  (ELAVIL ) 25 MG tablet Take 1 tablet (25 mg total) by mouth at bedtime as needed. 12/08/22     calcium carbonate (TUMS - DOSED IN MG ELEMENTAL CALCIUM) 500 MG chewable tablet Chew 1-3 tablets by mouth daily as needed for indigestion or heartburn.    [provider]  cephALEXin  (KEFLEX ) 500 MG capsule Take 1 capsule (500 mg total) by mouth 4 (four) times daily. 10/01/23   Laurie Baker, DO  cephALEXin  (KEFLEX ) 500 MG capsule Take 1 capsule (500 mg total) by mouth 4 (four) times daily. 10/01/23   Laurie Baker, DO  cetirizine  (ZYRTEC ) 10 MG tablet Take 1 tablet (10 mg total) by mouth every evening as needed for allergies. 12/08/22     diphenhydrAMINE  (BENADRYL ) 25 mg capsule Take 25-50 mg by mouth 2 (two) times daily as needed for allergies.     [provider]  ferrous sulfate  324 (65 Fe) MG TBEC 1 pill daily after meal to help with anemia 11/22/18   Reed, [name], MD  fluticasone -salmeterol (ADVAIR  DISKUS) 500-50 MCG/ACT AEPB Inhale 1 puff into the lungs in the morning and at bedtime. 01/29/23   Aleck Hurdle, MD  gabapentin  (NEURONTIN ) 100 MG capsule Take 1 capsule (  100 mg total) by mouth 3 (three) times daily. 04/21/22     ipratropium-albuterol  (DUONEB) 0.5-2.5 (3) MG/3ML SOLN Take 3 mLs by nebulization every 6 (six) hours as needed. 07/05/23   Mecum, Erin E, PA-C  levocetirizine (XYZAL ) 5 MG tablet Take 1 tablet (5 mg total) by mouth every evening. 06/16/22   Padgett, Rhoderick Ceo, MD  meloxicam  (MOBIC ) 15 MG tablet Take 1 tablet (15 mg total) by mouth daily. Patient taking differently: Take 15 mg by mouth as needed. 09/14/22     montelukast  (SINGULAIR ) 10 MG tablet Take 1 tablet (10 mg total) by mouth daily. 12/08/22     omeprazole  (PRILOSEC) 40 MG capsule Take 1 capsule (40 mg total) by mouth daily. 12/24/21 12/24/22  Aleck Hurdle, MD  oxyCODONE -acetaminophen  (PERCOCET/ROXICET) 5-325 MG tablet Take 1 tablet every 4-6 hours by mouth as needed for pain for 5 days. 10/07/23     phentermine  (ADIPEX-P ) 37.5 MG tablet Take 1 tablet (37.5 mg total) by mouth in the morning. 12/08/22     RYALTRIS  665-25 MCG/ACT SUSP 2 sprays per nostril 1-2 times daily. 06/16/22   Brian Campanile, MD  SUMAtriptan  (IMITREX ) 100 MG tablet Take 1 tablet (100 mg total) by mouth at onset of migraine symptoms. May repeat in 1-2 hours if persistent. (Max 200 mg per 24 hours) 12/08/22     tiZANidine  (ZANAFLEX ) 4 MG tablet Take 1 tablet (4 mg total) by mouth at bedtime. 11/20/22   Curatolo, Adam, DO  topiramate  (TOPAMAX ) 100 MG tablet Take 1 tablet (100 mg total) by mouth daily. 12/08/22     triamcinolone  cream (KENALOG ) 0.1 % Apply 1 Application topically 2 (two) times daily as needed. 11/17/22     Vitamin D , Ergocalciferol , 50000 units CAPS Take 1 capsule  (50,000 Units) by mouth once a week. 12/08/22     dicyclomine  (BENTYL ) 10 MG capsule Take 1 capsule (10 mg total) by mouth 4 (four) times daily -  before meals and at bedtime. For stomach cramping Patient not taking: Reported on 10/25/2019 06/13/19 10/25/19  Reed, Margy Shin, MD  sucralfate  (CARAFATE ) 1 GM/10ML suspension Take 10 mLs (1 g total) by mouth 4 (four) times daily -  with meals and at bedtime. Patient not taking: Reported on 10/25/2019 06/13/19 10/25/19  Berneda Bridges, MD    Physical Exam: Vitals:   10/09/23 0945 10/09/23 1015 10/09/23 1045 10/09/23 1132  BP: 127/81 118/71 125/77 108/80  Pulse: (!) 128 (!) 115 (!) 120 (!) 124  Resp: 19 18 (!) 25 (!) 22  Temp:    98.4 F (36.9 C)  TempSrc:    Oral  SpO2: 100% 97% 98% 100%    Physical Exam  Constitutional: NAD, calm, comfortable Eyes: PERRL, lids and conjunctivae normal ENMT: Mucous membranes are moist. Posterior pharynx clear of any exudate or lesions.Normal dentition.  Neck: normal, supple, no masses, no thyromegaly Respiratory: Bilateral wheezes on both lung field and very minimal air is going.  Cardiovascular: Regular rate and rhythm, no murmurs / rubs / gallops. No extremity edema. 2+ pedal pulses. No carotid bruits.  Abdomen: no tenderness, no masses palpated. No hepatosplenomegaly. Bowel sounds positive.  Musculoskeletal: no clubbing / cyanosis. No joint deformity upper and lower extremities. Good ROM, no contractures. Normal muscle tone.  Skin: no rashes, lesions, ulcers. No induration Neurologic: CN 2-12 grossly intact. Sensation intact, DTR normal. Strength 5/5 x all 4 extremities.  Psychiatric: Normal judgment and insight. Alert and oriented x 3. Normal mood.   Labs on Admission: I have  personally reviewed following labs and imaging studies  CBC: Recent Labs  Lab 10/09/23 0846  WBC 7.9  NEUTROABS 3.6  HGB 11.1*  HCT 35.2*  MCV 81.3  PLT 335   Basic Metabolic Panel: Recent Labs  Lab 10/09/23 0846  NA 138  K  3.7  CL 102  CO2 23  GLUCOSE 89  BUN 9  CREATININE 0.80  CALCIUM 9.4   Urine analysis:    Component Value Date/Time   COLORURINE YELLOW 10/01/2023 2121   APPEARANCEUR CLEAR 10/01/2023 2121   LABSPEC 1.036 (H) 10/01/2023 2121   PHURINE 5.5 10/01/2023 2121   GLUCOSEU NEGATIVE 10/01/2023 2121   HGBUR MODERATE (A) 10/01/2023 2121   BILIRUBINUR NEGATIVE 10/01/2023 2121   BILIRUBINUR negative 01/02/2020 1234   KETONESUR NEGATIVE 10/01/2023 2121   PROTEINUR TRACE (A) 10/01/2023 2121   UROBILINOGEN 1.0 01/02/2020 1234   UROBILINOGEN >=8.0 02/20/2018 1143   NITRITE NEGATIVE 10/01/2023 2121   LEUKOCYTESUR LARGE (A) 10/01/2023 2121    Radiological Exams on Admission: I have personally reviewed images DG Chest Port 1 View Result Date: 10/09/2023 CLINICAL DATA:  Shortness of breath. Nonproductive cough, wheezing for 3 days. EXAM: PORTABLE CHEST 1 VIEW COMPARISON:  04/12/2022 FINDINGS: Stable cardiomediastinal contours. Decreased lung volumes compared with previous exam. No pleural fluid, interstitial edema or airspace disease. Visualized osseous structures appear normal. IMPRESSION: Low lung volumes. No acute findings. Electronically Signed   By: Kimberley Penman M.D.   On: 10/09/2023 08:55    EKG: My personal interpretation of EKG shows: Tachycardia at 117 bpm.  There is no obvious ST elevation.    Assessment/Plan Principal Problem:   Asthma exacerbation   This is a 38 year old female with history of moderate persistent asthma presented to [facility] presents emergency room for cough and shortness of breath for the last 3 days.  It appears that she has a moderate persistent asthma and she had cough and shortness of breath due to acute exacerbation. She will be placed in observation. She was given Solu-Medrol  and nebulizer in the emergency room. I will continue Solu-Medrol  40 mg every 6 along with nebulization DuoNeb, cough medication and doxycycline  for possible bronchitis. Patient also  complains of chest pain while she is coughing.  EKG appears to be normal. Will get serial troponin.  Will also give her medication for possible reflux. She is saturating well without oxygen.  Monitor oxygenation to maintain saturation more than 90%.  Assessment and Plan: No notes have been filed under this hospital service. Service: Hospitalist      DVT prophylaxis: Lovenox Code Status: Full Code Family Communication: No family member was available. Patient is AAOx4  Disposition Plan: Home  Consults called: N/A  Admission status: Observation, Telemetry bed   Beatris Bough, MD [facility] 10/09/2023, 12:46 PM

## 2023-10-09 NOTE — ED Notes (Signed)
 MD ordered a continuous nebulizer(10mg  albuterol . RT had ordered a duoneb. The continuous was almost finished so RT put the duoneb in the continuous nebulizer. RT will evaluate the Pt after the CAT is finished and the steroids have started working.

## 2023-10-09 NOTE — ED Notes (Signed)
 Signed      MD ordered a continuous nebulizer(10mg  albuterol . RT had ordered a duoneb. The continuous was almost finished so RT put the duoneb in the continuous nebulizer. RT will evaluate the Pt after the CAT is finished and the steroids have started working.

## 2023-10-09 NOTE — Plan of Care (Incomplete)
  Problem: Education: Goal: Knowledge of General Education information will improve Description: Including pain rating scale, medication(s)/side effects and non-pharmacologic comfort measures Outcome: Progressing   Problem: Clinical Measurements: Goal: Ability to maintain clinical measurements within normal limits will improve Outcome: Progressing Goal: Respiratory complications will improve Outcome: Progressing   Problem: Coping: Goal: Level of anxiety will decrease Outcome: Progressing   Problem: Pain Managment: Goal: General experience of comfort will improve and/or be controlled Outcome: Progressing

## 2023-10-10 ENCOUNTER — Encounter (HOSPITAL_COMMUNITY): Payer: Self-pay | Admitting: Hospitalist

## 2023-10-10 DIAGNOSIS — Z6841 Body Mass Index (BMI) 40.0 and over, adult: Secondary | ICD-10-CM

## 2023-10-10 DIAGNOSIS — J302 Other seasonal allergic rhinitis: Secondary | ICD-10-CM | POA: Diagnosis not present

## 2023-10-10 DIAGNOSIS — D649 Anemia, unspecified: Secondary | ICD-10-CM | POA: Diagnosis not present

## 2023-10-10 DIAGNOSIS — J4541 Moderate persistent asthma with (acute) exacerbation: Secondary | ICD-10-CM

## 2023-10-10 LAB — CBC
HCT: 34.8 % — ABNORMAL LOW (ref 36.0–46.0)
Hemoglobin: 10.6 g/dL — ABNORMAL LOW (ref 12.0–15.0)
MCH: 25.4 pg — ABNORMAL LOW (ref 26.0–34.0)
MCHC: 30.5 g/dL (ref 30.0–36.0)
MCV: 83.3 fL (ref 80.0–100.0)
Platelets: 332 10*3/uL (ref 150–400)
RBC: 4.18 MIL/uL (ref 3.87–5.11)
RDW: 15 % (ref 11.5–15.5)
WBC: 10.6 10*3/uL — ABNORMAL HIGH (ref 4.0–10.5)
nRBC: 0 % (ref 0.0–0.2)

## 2023-10-10 LAB — BASIC METABOLIC PANEL WITH GFR
Anion gap: 5 (ref 5–15)
BUN: 9 mg/dL (ref 6–20)
CO2: 21 mmol/L — ABNORMAL LOW (ref 22–32)
Calcium: 8.3 mg/dL — ABNORMAL LOW (ref 8.9–10.3)
Chloride: 110 mmol/L (ref 98–111)
Creatinine, Ser: 0.54 mg/dL (ref 0.44–1.00)
GFR, Estimated: >60 mL/min (ref >60)
Glucose, Bld: 145 mg/dL — ABNORMAL HIGH (ref 70–99)
Potassium: 4.2 mmol/L (ref 3.5–5.1)
Sodium: 136 mmol/L (ref 135–145)

## 2023-10-10 LAB — HIV ANTIBODY (ROUTINE TESTING W REFLEX): HIV Screen 4th Generation wRfx: NONREACTIVE

## 2023-10-10 MED ORDER — FLUTICASONE FUROATE-VILANTEROL 200-25 MCG/ACT IN AEPB
1.0000 | INHALATION_SPRAY | Freq: Every day | RESPIRATORY_TRACT | Status: DC
  Administered 2023-10-11: 1 via RESPIRATORY_TRACT
  Filled 2023-10-10: qty 28

## 2023-10-10 MED ORDER — IPRATROPIUM-ALBUTEROL 0.5-2.5 (3) MG/3ML IN SOLN
3.0000 mL | Freq: Three times a day (TID) | RESPIRATORY_TRACT | Status: DC
  Administered 2023-10-11: 3 mL via RESPIRATORY_TRACT
  Filled 2023-10-10: qty 3

## 2023-10-10 MED ORDER — DOXYCYCLINE HYCLATE 100 MG PO TABS
100.0000 mg | ORAL_TABLET | Freq: Two times a day (BID) | ORAL | Status: DC
  Administered 2023-10-10 – 2023-10-11 (×2): 100 mg via ORAL
  Filled 2023-10-10 (×2): qty 1

## 2023-10-10 MED ORDER — PROCHLORPERAZINE EDISYLATE 10 MG/2ML IJ SOLN
10.0000 mg | Freq: Four times a day (QID) | INTRAMUSCULAR | Status: DC | PRN
  Administered 2023-10-10 – 2023-10-11 (×2): 10 mg via INTRAVENOUS
  Filled 2023-10-10 (×2): qty 2

## 2023-10-10 MED ORDER — GABAPENTIN 100 MG PO CAPS
100.0000 mg | ORAL_CAPSULE | Freq: Three times a day (TID) | ORAL | Status: DC
Start: 2023-10-10 — End: 2023-10-11
  Administered 2023-10-10 – 2023-10-11 (×3): 100 mg via ORAL
  Filled 2023-10-10 (×3): qty 1

## 2023-10-10 MED ORDER — TOPIRAMATE 100 MG PO TABS: 100.0000 mg | ORAL_TABLET | Freq: Every day | ORAL | Status: DC

## 2023-10-10 MED ORDER — MONTELUKAST SODIUM 10 MG PO TABS
10.0000 mg | ORAL_TABLET | Freq: Every day | ORAL | Status: DC
  Administered 2023-10-11: 10 mg via ORAL
  Filled 2023-10-10: qty 1

## 2023-10-10 MED ORDER — AMITRIPTYLINE HCL 25 MG PO TABS: 25.0000 mg | ORAL_TABLET | Freq: Every evening | ORAL | Status: DC | PRN

## 2023-10-10 NOTE — Progress Notes (Signed)
  Progress Note   PatientChyanna Reed ZOX:[id number] DOB: [birth year] DOA: 10/09/2023     0 DOS: the patient was seen and examined on 10/10/2023   Brief hospital course: 38 year old woman PMH moderate persistent asthma presenting with cough and shortness of breath.  Admitted for asthma exacerbation.  Consultants None   Procedures/Events None   Assessment and Plan: Acute asthma exacerbation Moderate persistent asthma, allergic Seasonal allergic rhinitis No hypoxia.  Chest x-ray no acute disease.  COVID, influenza, RSV negative.  Slow to improve.  Continue bronchodilators, IV steroid, doxycycline .  Resume Singulair .  Chest pain atypical secondary to cough EKG ST no acute changes, independently reviewed.  No indication for troponins.  Normocytic anemia Stable.  Can follow-up as an outpatient.  Morbid obesity Body mass index is 53.35 kg/m.     Subjective:  Feels a little better but still wheezy and short of breath.  Not back to baseline.  Coughing, dry.  Physical Exam: Vitals:   10/10/23 0750 10/10/23 0800 10/10/23 0931 10/10/23 1347  BP:   112/67 (!) 119/58  Pulse:   94 93  Resp:   16 16  Temp:   98.2 F (36.8 C) 97.7 F (36.5 C)  TempSrc:   Oral Oral  SpO2: 97%  98% 98%  Weight:  132.3 kg    Height:  5\' 2"  (1.575 m)     Physical Exam Vitals reviewed.  Constitutional:      General: She is not in acute distress.    Appearance: She is not ill-appearing or toxic-appearing.  Cardiovascular:     Rate and Rhythm: Normal rate and regular rhythm.     Heart sounds: No murmur heard. Pulmonary:     Effort: No respiratory distress.     Breath sounds: Wheezing present. No rhonchi or rales.     Comments: Mild increased respiratory effort.  Dry cough. Neurological:     Mental Status: She is alert.  Psychiatric:        Mood and Affect: Mood normal.        Behavior: Behavior normal.     Data Reviewed: Basic metabolic panel unremarkable Hemoglobin stable  10.6  Family Communication: none   Disposition: Status is: Observation      Time spent: 35 minutes  Author: Jerline Moon, MD 10/10/2023 2:23 PM  For on call review [email or web address].

## 2023-10-10 NOTE — Hospital Course (Addendum)
 38 year old woman PMH moderate persistent asthma presenting with cough and shortness of breath.  Admitted for asthma exacerbation.  Consultants None   Procedures/Events None

## 2023-10-10 NOTE — Plan of Care (Signed)

## 2023-10-10 NOTE — Plan of Care (Signed)

## 2023-10-11 ENCOUNTER — Other Ambulatory Visit (HOSPITAL_COMMUNITY): Payer: Self-pay

## 2023-10-11 DIAGNOSIS — Z6841 Body Mass Index (BMI) 40.0 and over, adult: Secondary | ICD-10-CM | POA: Diagnosis not present

## 2023-10-11 DIAGNOSIS — J4541 Moderate persistent asthma with (acute) exacerbation: Secondary | ICD-10-CM | POA: Diagnosis not present

## 2023-10-11 MED ORDER — DOXYCYCLINE HYCLATE 100 MG PO TABS
100.0000 mg | ORAL_TABLET | Freq: Two times a day (BID) | ORAL | 0 refills | Status: AC
  Filled 2023-10-11: qty 14, 7d supply, fill #0

## 2023-10-11 MED ORDER — PREDNISONE 10 MG PO TABS
ORAL_TABLET | ORAL | 0 refills | Status: AC
  Filled 2023-10-11: qty 21, 9d supply, fill #0

## 2023-10-11 NOTE — Discharge Summary (Signed)
 Physician Discharge Summary   Patient: Laurie Reed MRN: [id number] DOB: Apr 08, 1986  Admit date:     10/09/2023  Discharge date: 10/11/23  Discharge Physician: Jerline Moon   PCP: [name], MD   Recommendations at discharge:   Follow-up asthma exacerbation  Follow-up morbid obesity  Discharge Diagnoses: Principal Problem:   Asthma exacerbation Active Problems:   Allergic asthma   Seasonal allergic rhinitis   Morbid obesity with BMI of 45.0-49.9, adult (HCC)   Moderate persistent asthma   Normocytic anemia  Resolved Problems:   * No resolved hospital problems. *  Hospital Course: 38 year old woman PMH moderate persistent asthma presenting with cough and shortness of breath.  Admitted for asthma exacerbation.  Condition gradually improved.  Discharged home in good condition.  Outpatient follow-up with pulmonology.  Consultants None   Procedures/Events None   Acute asthma exacerbation Moderate persistent asthma, allergic Seasonal allergic rhinitis No hypoxia.  Chest x-ray no acute disease.  COVID, influenza, RSV negative.   Slowly improving.  Discharged home on prednisone  taper, complete doxycycline .  She reports she is no longer taking Singulair . Outpatient follow-up with pulmonology, referral placed.   Chest pain atypical secondary to cough EKG ST no acute changes, independently reviewed.  No indication for troponins.   Normocytic anemia Stable.  Can follow-up as an outpatient.   Morbid obesity Body mass index is 53.35 kg/m.  Disposition: Home Diet recommendation:  Regular diet DISCHARGE MEDICATION: Allergies as of 10/11/2023       Reactions   Latex Hives, Itching   Other Itching   "20 different types of trees"   Zofran  [ondansetron ] Itching   Tree Extract Itching        Medication List     STOP taking these medications    cephALEXin  500 MG capsule Commonly known as: KEFLEX    montelukast  10 MG tablet Commonly known as:  Singulair        TAKE these medications    acetaminophen  500 MG tablet Commonly known as: TYLENOL  Take 1,000 mg by mouth every 6 (six) hours as needed for mild pain (pain score 1-3), moderate pain (pain score 4-6) or headache (or migraines).   Advair  Diskus 500-50 MCG/ACT Aepb Generic drug: fluticasone -salmeterol Inhale 1 puff into the lungs in the morning and at bedtime.   albuterol  (2.5 MG/3ML) 0.083% nebulizer solution Commonly known as: PROVENTIL  Use 1 vial via nebulizer every 6 hours and as needed What changed:  how to take this additional instructions Another medication with the same name was removed. Continue taking this medication, and follow the directions you see here.   Ventolin  HFA 108 (90 Base) MCG/ACT inhaler Generic drug: albuterol  Inhale 2 puffs into the lungs 4 (four) times daily as needed. What changed:  when to take this reasons to take this Another medication with the same name was removed. Continue taking this medication, and follow the directions you see here.   amitriptyline  25 MG tablet Commonly known as: ELAVIL  Take 1 tablet (25 mg total) by mouth at bedtime as needed. What changed: reasons to take this   cetirizine  10 MG tablet Commonly known as: ZYRTEC  Take 1 tablet (10 mg total) by mouth every evening as needed for allergies.   diphenhydrAMINE  25 mg capsule Commonly known as: BENADRYL  Take 25-50 mg by mouth 2 (two) times daily as needed for allergies.   doxycycline  100 MG tablet Commonly known as: VIBRA -TABS Take 1 tablet (100 mg total) by mouth every 12 (twelve) hours.   ferrous sulfate  324 (65 Fe) MG Tbec  1 pill daily after meal to help with anemia   gabapentin  100 MG capsule Commonly known as: Neurontin  Take 1 capsule (100 mg total) by mouth 3 (three) times daily.   ipratropium-albuterol  0.5-2.5 (3) MG/3ML Soln Commonly known as: DUONEB Take 3 mLs by nebulization every 6 (six) hours as needed.   levocetirizine 5 MG  tablet Commonly known as: XYZAL  Take 1 tablet (5 mg total) by mouth every evening.   meloxicam  15 MG tablet Commonly known as: MOBIC  Take 1 tablet (15 mg total) by mouth daily. What changed:  when to take this reasons to take this   omeprazole  40 MG capsule Commonly known as: PRILOSEC Take 1 capsule (40 mg total) by mouth daily.   oxyCODONE -acetaminophen  5-325 MG tablet Commonly known as: PERCOCET/ROXICET Take 1 tablet every 4-6 hours by mouth as needed for pain for 5 days.   phentermine  37.5 MG tablet Commonly known as: ADIPEX-P  Take 1 tablet (37.5 mg total) by mouth in the morning.   predniSONE  10 MG tablet Commonly known as: DELTASONE  Take 4 tablets (40 mg total) by mouth daily for 3 days, THEN 2 tablets (20 mg total) daily for 3 days, THEN 1 tablet (10 mg total) daily for 3 days. Start taking on: Oct 11, 2023   Ryaltris  161-09 MCG/ACT Susp Generic drug: Olopatadine-Mometasone 2 sprays per nostril 1-2 times daily.   SUMAtriptan  100 MG tablet Commonly known as: Imitrex  Take 1 tablet (100 mg total) by mouth at onset of migraine symptoms. May repeat in 1-2 hours if persistent. (Max 200 mg per 24 hours)   tiZANidine  4 MG tablet Commonly known as: Zanaflex  Take 1 tablet (4 mg total) by mouth at bedtime.   topiramate  100 MG tablet Commonly known as: Topamax  Take 1 tablet (100 mg total) by mouth daily.   triamcinolone  cream 0.1 % Commonly known as: KENALOG  Apply 1 Application topically 2 (two) times daily as needed.   Vitamin D  (Ergocalciferol ) 1.25 MG (50000 UNIT) Caps capsule Commonly known as: DRISDOL  Take 1 capsule (50,000 Units) by mouth once a week.        Follow-up Information     [facility] Follow up.   Specialty: Pulmonology Why: Office will contact you with an appointment Contact information: [address] [place]  [phone] Additional information: [address]   [address]  [place], [place] [address]               Still coughing  Discharge Exam: [name] Weights   10/10/23 0800  Weight: 132.3 kg   Physical Exam Vitals reviewed.  Constitutional:      General: She is not in acute distress.    Appearance: She is not ill-appearing or toxic-appearing.  Cardiovascular:     Rate and Rhythm: Normal rate and regular rhythm.     Heart sounds: No murmur heard. Pulmonary:     Effort: Pulmonary effort is normal. No respiratory distress.     Breath sounds: No wheezing, rhonchi or rales.  Neurological:     Mental Status: She is alert.  Psychiatric:        Mood and Affect: Mood normal.        Behavior: Behavior normal.      Condition at discharge: good  The results of significant diagnostics from this hospitalization (including imaging, microbiology, ancillary and laboratory) are listed below for reference.   Imaging Studies: DG Chest Port 1 View Result Date: 10/09/2023 CLINICAL DATA:  Shortness  of breath. Nonproductive cough, wheezing for 3 days. EXAM: PORTABLE CHEST 1 VIEW COMPARISON:  04/12/2022 FINDINGS: Stable cardiomediastinal contours. Decreased lung volumes compared with previous exam. No pleural fluid, interstitial edema or airspace disease. Visualized osseous structures appear normal. IMPRESSION: Low lung volumes. No acute findings. Electronically Signed   By: Kimberley Penman M.D.   On: 10/09/2023 08:55    Microbiology: Results for orders placed or performed during the hospital encounter of 10/09/23  Resp panel by RT-PCR (RSV, Flu A&B, Covid) Anterior Nasal Swab     Status: None   Collection Time: 10/09/23 10:15 AM   Specimen: Anterior Nasal Swab  Result Value Ref Range Status   SARS Coronavirus 2 by RT PCR NEGATIVE NEGATIVE Final    Comment: (NOTE) SARS-CoV-2 target nucleic acids are NOT DETECTED.  The SARS-CoV-2 RNA is generally detectable in upper respiratory specimens during the acute phase of infection. The lowest concentration  of SARS-CoV-2 viral copies this assay can detect is 138 copies/mL. A negative result does not preclude SARS-Cov-2 infection and should not be used as the sole basis for treatment or other patient management decisions. A negative result may occur with  improper specimen collection/handling, submission of specimen other than nasopharyngeal swab, presence of viral mutation(s) within the areas targeted by this assay, and inadequate number of viral copies(<138 copies/mL). A negative result must be combined with clinical observations, patient history, and epidemiological information. The expected result is Negative.  Fact Sheet for Patients:  BloggerCourse.com  Fact Sheet for Healthcare Providers:  SeriousBroker.it  This test is no t yet approved or cleared by the [place]  FDA and  has been authorized for detection and/or diagnosis of SARS-CoV-2 by FDA under an Emergency Use Authorization (EUA). This EUA will remain  in effect (meaning this test can be used) for the duration of the COVID-19 declaration under Section 564(b)(1) of the Act, 21 U.S.C.section 360bbb-3(b)(1), unless the authorization is terminated  or revoked sooner.       Influenza A by PCR NEGATIVE NEGATIVE Final   Influenza B by PCR NEGATIVE NEGATIVE Final    Comment: (NOTE) The Xpert Xpress SARS-CoV-2/FLU/RSV plus assay is intended as an aid in the diagnosis of influenza from Nasopharyngeal swab specimens and should not be used as a sole basis for treatment. Nasal washings and aspirates are unacceptable for Xpert Xpress SARS-CoV-2/FLU/RSV testing.  Fact Sheet for Patients: BloggerCourse.com  Fact Sheet for Healthcare Providers: SeriousBroker.it  This test is not yet approved or cleared by the [place]  FDA and has been authorized for detection and/or diagnosis of SARS-CoV-2 by FDA under an Emergency Use  Authorization (EUA). This EUA will remain in effect (meaning this test can be used) for the duration of the COVID-19 declaration under Section 564(b)(1) of the Act, 21 U.S.C. section 360bbb-3(b)(1), unless the authorization is terminated or revoked.     Resp Syncytial Virus by PCR NEGATIVE NEGATIVE Final    Comment: (NOTE) Fact Sheet for Patients: BloggerCourse.com  Fact Sheet for Healthcare Providers: SeriousBroker.it  This test is not yet approved or cleared by the [place]  FDA and has been authorized for detection and/or diagnosis of SARS-CoV-2 by FDA under an Emergency Use Authorization (EUA). This EUA will remain in effect (meaning this test can be used) for the duration of the COVID-19 declaration under Section 564(b)(1) of the Act, 21 U.S.C. section 360bbb-3(b)(1), unless the authorization is terminated or revoked.  Performed at [facility], [address] [place], [place] [address]     Labs:  CBC: Recent Labs  Lab 10/09/23 0846 10/10/23 0427  WBC 7.9 10.6*  NEUTROABS 3.6  --   HGB 11.1* 10.6*  HCT 35.2* 34.8*  MCV 81.3 83.3  PLT 335 332   Basic Metabolic Panel: Recent Labs  Lab 10/09/23 0846 10/10/23 0427  NA 138 136  K 3.7 4.2  CL 102 110  CO2 23 21*  GLUCOSE 89 145*  BUN 9 9  CREATININE 0.80 0.54  CALCIUM 9.4 8.3*   Liver Function Tests: No results for input(s): "AST", "ALT", "ALKPHOS", "BILITOT", "PROT", "ALBUMIN" in the last 168 hours. CBG: No results for input(s): "GLUCAP" in the last 168 hours.  Discharge time spent: less than 30 minutes.  Signed: Jerline Moon, MD [facility] 10/11/2023

## 2023-10-11 NOTE — Progress Notes (Signed)
 Discharge medication delivered to patient at bedside [facility]

## 2023-10-11 NOTE — Plan of Care (Signed)

## 2023-10-11 NOTE — Plan of Care (Signed)
   Problem: Clinical Measurements: Goal: Will remain free from infection Outcome: Progressing   Problem: Clinical Measurements: Goal: Diagnostic test results will improve Outcome: Progressing   Problem: Clinical Measurements: Goal: Respiratory complications will improve Outcome: Progressing

## 2023-10-14 ENCOUNTER — Emergency Department (HOSPITAL_BASED_OUTPATIENT_CLINIC_OR_DEPARTMENT_OTHER)

## 2023-10-14 ENCOUNTER — Other Ambulatory Visit (HOSPITAL_COMMUNITY): Payer: Self-pay

## 2023-10-14 ENCOUNTER — Emergency Department (HOSPITAL_BASED_OUTPATIENT_CLINIC_OR_DEPARTMENT_OTHER)
Admission: EM | Admit: 2023-10-14 | Discharge: 2023-10-15 | Disposition: A | Discharge status: Home / Self Care | Attending: Emergency Medicine | Admitting: Emergency Medicine

## 2023-10-14 ENCOUNTER — Encounter (HOSPITAL_BASED_OUTPATIENT_CLINIC_OR_DEPARTMENT_OTHER): Payer: Self-pay

## 2023-10-14 ENCOUNTER — Other Ambulatory Visit: Payer: Self-pay

## 2023-10-14 DIAGNOSIS — R0682 Tachypnea, not elsewhere classified: Secondary | ICD-10-CM | POA: Insufficient documentation

## 2023-10-14 DIAGNOSIS — Z91040 Latex allergy status: Secondary | ICD-10-CM | POA: Diagnosis not present

## 2023-10-14 DIAGNOSIS — R0602 Shortness of breath: Secondary | ICD-10-CM | POA: Diagnosis present

## 2023-10-14 DIAGNOSIS — J45901 Unspecified asthma with (acute) exacerbation: Secondary | ICD-10-CM

## 2023-10-14 DIAGNOSIS — R062 Wheezing: Secondary | ICD-10-CM | POA: Diagnosis not present

## 2023-10-14 LAB — BASIC METABOLIC PANEL WITH GFR
Anion gap: 12 (ref 5–15)
BUN: 13 mg/dL (ref 6–20)
CO2: 25 mmol/L (ref 22–32)
Calcium: 9.2 mg/dL (ref 8.9–10.3)
Chloride: 100 mmol/L (ref 98–111)
Creatinine, Ser: 0.75 mg/dL (ref 0.44–1.00)
GFR, Estimated: >60 mL/min (ref >60)
Glucose, Bld: 95 mg/dL (ref 70–99)
Potassium: 4.3 mmol/L (ref 3.5–5.1)
Sodium: 138 mmol/L (ref 135–145)

## 2023-10-14 LAB — CBC
HCT: 40 % (ref 36.0–46.0)
Hemoglobin: 12.5 g/dL (ref 12.0–15.0)
MCH: 25.3 pg — ABNORMAL LOW (ref 26.0–34.0)
MCHC: 31.3 g/dL (ref 30.0–36.0)
MCV: 80.8 fL (ref 80.0–100.0)
Platelets: 337 10*3/uL (ref 150–400)
RBC: 4.95 MIL/uL (ref 3.87–5.11)
RDW: 14.6 % (ref 11.5–15.5)
WBC: 14.7 10*3/uL — ABNORMAL HIGH (ref 4.0–10.5)
nRBC: 0 % (ref 0.0–0.2)

## 2023-10-14 LAB — HCG, SERUM, QUALITATIVE: Preg, Serum: NEGATIVE

## 2023-10-14 LAB — D-DIMER, QUANTITATIVE: D-Dimer, Quant: 0.6 ug{FEU}/mL — ABNORMAL HIGH (ref 0.00–0.50)

## 2023-10-14 MED ORDER — ALBUTEROL (5 MG/ML) CONTINUOUS INHALATION SOLN
10.0000 mg/h | INHALATION_SOLUTION | RESPIRATORY_TRACT | Status: AC
  Administered 2023-10-14: 10 mg/h via RESPIRATORY_TRACT
  Filled 2023-10-14: qty 20

## 2023-10-14 MED ORDER — SODIUM CHLORIDE 0.9 % IV BOLUS
1000.0000 mL | Freq: Once | INTRAVENOUS | Status: AC
  Administered 2023-10-14: 1000 mL via INTRAVENOUS

## 2023-10-14 MED ORDER — METOCLOPRAMIDE HCL 5 MG/ML IJ SOLN
5.0000 mg | Freq: Once | INTRAMUSCULAR | Status: AC
  Administered 2023-10-14: 5 mg via INTRAVENOUS
  Filled 2023-10-14: qty 2

## 2023-10-14 MED ORDER — METHYLPREDNISOLONE SODIUM SUCC 125 MG IJ SOLR
125.0000 mg | Freq: Once | INTRAMUSCULAR | Status: AC
  Administered 2023-10-14: 125 mg via INTRAVENOUS
  Filled 2023-10-14: qty 2

## 2023-10-14 MED ORDER — IOHEXOL 350 MG/ML SOLN
100.0000 mL | Freq: Once | INTRAVENOUS | Status: AC | PRN
  Administered 2023-10-14: 75 mL via INTRAVENOUS

## 2023-10-14 NOTE — ED Provider Notes (Signed)
 [place] EMERGENCY DEPARTMENT AT [facility] Provider Note   CSN: [id number] Arrival date & time: 10/14/23  2019     History  Chief Complaint  Patient presents with   Shortness of Breath   Chest Pain    Laurie Reed is a 38 y.o. female.  HPI   38 year old female presents emergency department with concern for shortness of breath, wheezing.  Recently discharged 3 days ago after an admission for asthma exacerbation.  She is continuing doxycycline  and prednisone  taper.  She has been using her home inhaler and medications without significant improvement.  Comes in today with worsening shortness of breath, palpitations.  Denies any leg swelling, fever.  Home Medications Prior to Admission medications   Medication Sig Start Date End Date Taking? Authorizing Provider  acetaminophen  (TYLENOL ) 500 MG tablet Take 1,000 mg by mouth every 6 (six) hours as needed for mild pain (pain score 1-3), moderate pain (pain score 4-6) or headache (or migraines).    [provider]  albuterol  (PROVENTIL ) (2.5 MG/3ML) 0.083% nebulizer solution Use 1 vial via nebulizer every 6 hours and as needed Patient taking differently: Take 2.5 mg by nebulization every 6 (six) hours. Shortness of breath 05/15/21     albuterol  (VENTOLIN  HFA) 108 (90 Base) MCG/ACT inhaler Inhale 2 puffs into the lungs 4 (four) times daily as needed. Patient taking differently: Inhale 2 puffs into the lungs daily as needed for shortness of breath or wheezing. 01/28/23     amitriptyline  (ELAVIL ) 25 MG tablet Take 1 tablet (25 mg total) by mouth at bedtime as needed. Patient taking differently: Take 25 mg by mouth at bedtime as needed for sleep. 12/08/22     cetirizine  (ZYRTEC ) 10 MG tablet Take 1 tablet (10 mg total) by mouth every evening as needed for allergies. 12/08/22     diphenhydrAMINE  (BENADRYL ) 25 mg capsule Take 25-50 mg by mouth 2 (two) times daily as needed for allergies.    [provider]   doxycycline  (VIBRA -TABS) 100 MG tablet Take 1 tablet (100 mg total) by mouth every 12 (twelve) hours. 10/11/23   Lonita Roach, MD  ferrous sulfate  324 (65 Fe) MG TBEC 1 pill daily after meal to help with anemia 11/22/18   [name], [name], MD  fluticasone -salmeterol (ADVAIR  DISKUS) 500-50 MCG/ACT AEPB Inhale 1 puff into the lungs in the morning and at bedtime. 01/29/23   Aleck Hurdle, MD  gabapentin  (NEURONTIN ) 100 MG capsule Take 1 capsule (100 mg total) by mouth 3 (three) times daily. 04/21/22     ipratropium-albuterol  (DUONEB) 0.5-2.5 (3) MG/3ML SOLN Take 3 mLs by nebulization every 6 (six) hours as needed. 07/05/23   Mecum, Erin E, PA-C  levocetirizine (XYZAL ) 5 MG tablet Take 1 tablet (5 mg total) by mouth every evening. 06/16/22   Padgett, Rhoderick Ceo, MD  meloxicam  (MOBIC ) 15 MG tablet Take 1 tablet (15 mg total) by mouth daily. Patient taking differently: Take 15 mg by mouth as needed. 09/14/22     omeprazole  (PRILOSEC) 40 MG capsule Take 1 capsule (40 mg total) by mouth daily. 12/24/21 10/09/23  Aleck Hurdle, MD  oxyCODONE -acetaminophen  (PERCOCET/ROXICET) 5-325 MG tablet Take 1 tablet every 4-6 hours by mouth as needed for pain for 5 days. 10/07/23     phentermine  (ADIPEX-P ) 37.5 MG tablet Take 1 tablet (37.5 mg total) by mouth in the morning. 12/08/22     predniSONE  (DELTASONE ) 10 MG tablet Take 4 tablets by mouth daily for 3 days, THEN Take 2 tablets daily  for 3 days, THEN Take 1 tablet daily for 3 days. 10/11/23 10/20/23  Lonita Roach, MD  RYALTRIS  [phone] MCG/ACT SUSP 2 sprays per nostril 1-2 times daily. 06/16/22   Brian Campanile, MD  SUMAtriptan  (IMITREX ) 100 MG tablet Take 1 tablet (100 mg total) by mouth at onset of migraine symptoms. May repeat in 1-2 hours if persistent. (Max 200 mg per 24 hours) 12/08/22     tiZANidine  (ZANAFLEX ) 4 MG tablet Take 1 tablet (4 mg total) by mouth at bedtime. 11/20/22   Curatolo, Adam, DO  topiramate  (TOPAMAX ) 100 MG tablet Take 1 tablet  (100 mg total) by mouth daily. 12/08/22     triamcinolone  cream (KENALOG ) 0.1 % Apply 1 Application topically 2 (two) times daily as needed. 11/17/22     Vitamin D , Ergocalciferol , 50000 units CAPS Take 1 capsule (50,000 Units) by mouth once a week. 12/08/22     dicyclomine  (BENTYL ) 10 MG capsule Take 1 capsule (10 mg total) by mouth 4 (four) times daily -  before meals and at bedtime. For stomach cramping Patient not taking: Reported on 10/25/2019 06/13/19 10/25/19  [name], Margy Shin, MD  sucralfate  (CARAFATE ) 1 GM/10ML suspension Take 10 mLs (1 g total) by mouth 4 (four) times daily -  with meals and at bedtime. Patient not taking: Reported on 10/25/2019 06/13/19 10/25/19  Berneda Bridges, MD      Allergies    Latex, Other, Zofran  [ondansetron ], and Tree extract    Review of Systems   Review of Systems  Constitutional:  Positive for fatigue. Negative for fever.  Respiratory:  Positive for cough, shortness of breath and wheezing.   Cardiovascular:  Negative for chest pain and leg swelling.  Gastrointestinal:  Negative for abdominal pain, diarrhea and vomiting.  Skin:  Negative for rash.  Neurological:  Negative for headaches.    Physical Exam Updated Vital Signs BP 120/67   Pulse (!) 131   Temp 98.1 F (36.7 C) (Oral)   Resp (!) 23   Ht 5\' 2"  (1.575 m)   Wt 132.3 kg   LMP 10/07/2023 (Exact Date)   SpO2 98%   BMI 53.35 kg/m  Physical Exam Vitals and nursing note reviewed.  Constitutional:      General: She is in acute distress.     Appearance: Normal appearance.  HENT:     Head: Normocephalic.     Mouth/Throat:     Mouth: Mucous membranes are moist.  Cardiovascular:     Rate and Rhythm: Normal rate.  Pulmonary:     Effort: Tachypnea and accessory muscle usage present. No respiratory distress.     Breath sounds: Examination of the right-lower field reveals decreased breath sounds. Examination of the left-lower field reveals decreased breath sounds. Decreased breath sounds, wheezing and  rhonchi present. No rales.  Abdominal:     Palpations: Abdomen is soft.     Tenderness: There is no abdominal tenderness.  Musculoskeletal:     Right lower leg: No edema.     Left lower leg: No edema.  Skin:    General: Skin is warm.  Neurological:     Mental Status: She is alert and oriented to person, place, and time. Mental status is at baseline.  Psychiatric:        Mood and Affect: Mood normal.     ED Results / Procedures / Treatments   Labs (all labs ordered are listed, but only abnormal results are displayed) Labs Reviewed  CBC - Abnormal; Notable for the following components:  Result Value   WBC 14.7 (*)    MCH 25.3 (*)    All other components within normal limits  BASIC METABOLIC PANEL WITH GFR  HCG, SERUM, QUALITATIVE    EKG EKG Interpretation Date/Time:  Friday Oct 14 2023 20:38:28 EDT Ventricular Rate:  98 PR Interval:  132 QRS Duration:  92 QT Interval:  368 QTC Calculation: 470 R Axis:   39  Text Interpretation: Sinus rhythm no change Confirmed by Florentino Hurdle [phone] on 10/14/2023 10:03:20 PM  Radiology DG Chest Port 1 View Result Date: 10/14/2023 CLINICAL DATA:  Asthma attack EXAM: PORTABLE CHEST 1 VIEW COMPARISON:  10/09/2023 FINDINGS: The heart size and mediastinal contours are within normal limits. Both lungs are clear. The visualized skeletal structures are unremarkable. IMPRESSION: No active disease. Electronically Signed   By: Worthy Heads M.D.   On: 10/14/2023 21:46    Procedures Procedures    Medications Ordered in ED Medications  albuterol  (PROVENTIL ,VENTOLIN ) solution continuous neb (0 mg/hr Nebulization Stopped 10/14/23 2142)  methylPREDNISolone  sodium succinate (SOLU-MEDROL ) 125 mg/2 mL injection 125 mg (has no administration in time range)    ED Course/ Medical Decision Making/ A&P Clinical Course as of 10/14/23 2326  Fri Oct 14, 2023  2302 Stable 37 YOF admitted for asthma SOB Getting PE study. Just finished continuous  x2 hours  [CC]    Clinical Course User Index [CC] Onetha Bile, MD                                 Medical Decision Making Amount and/or Complexity of Data Reviewed Labs: ordered. Radiology: ordered.  Risk Prescription drug management.   38 year old female with recent admission and discharge for asthma, currently on prednisone  and doxycycline  presents the emergency department with worsening shortness of breath, wheezing.  On arrival she is tachycardic, tachypneic.  Not able to be conversational secondary to shortness of breath.  Diffuse inspiratory Nexa Tory wheezing with rhonchi.  Continuous albuterol  started, dose of steroids initiated.  Workup shows mild leukocytosis, possibly secondary to prednisone .  Pregnancy test is negative.  Chest x-ray shows no acute finding.  Patient continues to be tachycardic, possibly secondary to breathing treatments but given recent admission D-dimer was done which is elevated 0.6.  CT PE study ordered and is pending at time of signout.        Final Clinical Impression(s) / ED Diagnoses Final diagnoses:  None    Rx / DC Orders ED Discharge Orders     None         Flonnie Humphrey, DO 10/14/23 2327

## 2023-10-14 NOTE — ED Provider Notes (Signed)
 Care of patient received from prior provider at 2:23 AM, please see their note for complete H/P and care plan.  Received handoff per ED course.  Clinical Course as of 10/15/23 0223  Fri Oct 14, 2023  2302 Stable 37 YOF admitted for asthma SOB Getting PE study. Just finished continuous x2 hours  [CC]    Clinical Course User Index [CC] Onetha Bile, MD   CRITICAL CARE Performed by: Onetha Bile   Total critical care time: 45 minutes  Critical care time was exclusive of separately billable procedures and treating other patients.  Critical care was necessary to treat or prevent imminent or life-threatening deterioration.  Critical care was time spent personally by me on the following activities: development of treatment plan with patient and/or surrogate as well as nursing, discussions with consultants, evaluation of patient's response to treatment, examination of patient, obtaining history from patient or surrogate, ordering and performing treatments and interventions, ordering and review of laboratory studies, ordering and review of radiographic studies, pulse oximetry and re-evaluation of patient's condition.  Reassessment: Patient continued on continuous albuterol  until 10 PM. Pulmonary embolism scan without focal pathology patient observed for 5 hours after cessation of continuous albuterol  and in no acute distress.  Vital signs all normal limits.  Oxygen saturation 100% patient resting comfortably.  Patient had been treated with escalating steroid dose and is otherwise stable for outpatient care management follow-up with pulmonologist for definitive care management outpatient setting. Complex asthma exacerbation requiring frequent reassessment while in the ER.     Onetha Bile, MD 10/15/23 [phone]

## 2023-10-14 NOTE — ED Triage Notes (Signed)
 States discharge on Wednesday for asthma.  Present short of breath and mid sternal chest pain.  Labored resp.

## 2023-10-18 ENCOUNTER — Ambulatory Visit: Admitting: Internal Medicine

## 2023-10-18 ENCOUNTER — Other Ambulatory Visit (HOSPITAL_COMMUNITY): Payer: Self-pay

## 2023-10-18 ENCOUNTER — Encounter: Payer: Self-pay | Admitting: Internal Medicine

## 2023-10-18 ENCOUNTER — Ambulatory Visit: Payer: Self-pay | Admitting: Internal Medicine

## 2023-10-18 VITALS — BP 104/72 | HR 91 | Ht 62.0 in | Wt 283.0 lb

## 2023-10-18 DIAGNOSIS — K219 Gastro-esophageal reflux disease without esophagitis: Secondary | ICD-10-CM | POA: Diagnosis not present

## 2023-10-18 DIAGNOSIS — J4550 Severe persistent asthma, uncomplicated: Secondary | ICD-10-CM | POA: Diagnosis not present

## 2023-10-18 DIAGNOSIS — J3089 Other allergic rhinitis: Secondary | ICD-10-CM | POA: Diagnosis not present

## 2023-10-18 MED ORDER — HYDROCOD POLI-CHLORPHE POLI ER 10-8 MG/5ML PO SUER
5.0000 mL | Freq: Two times a day (BID) | ORAL | 0 refills | Status: DC
  Filled 2023-10-18: qty 300, 30d supply, fill #0

## 2023-10-18 MED ORDER — HYDROCODONE BIT-HOMATROP MBR 5-1.5 MG/5ML PO SOLN
5.0000 mL | Freq: Four times a day (QID) | ORAL | 0 refills | Status: AC | PRN
  Filled 2023-10-18: qty 240, 12d supply, fill #0

## 2023-10-18 MED ORDER — HYDROCOD POLI-CHLORPHE POLI ER 10-8 MG/5ML PO SUER
5.0000 mL | Freq: Two times a day (BID) | ORAL | 0 refills | Status: DC | PRN
  Filled 2023-10-18: qty 300, 30d supply, fill #0

## 2023-10-18 NOTE — Telephone Encounter (Signed)
 Pulmonary Office Staff advised that the Laurie Reed is scheduled for today 10/18/2023 with Dr Dione Franks at 11 am.  Laurie Reed was called and is aware of the time.

## 2023-10-18 NOTE — Patient Instructions (Addendum)
 Sorry to hear you have been feeling well.  I suspect your asthma is not well-controlled although we are not sure what keeps triggering it.  I recommend staying on the Advair  1 puff twice daily, gargle after use continue your albuterol  nebulizers and treatments.  I offered extending your steroids today but you said that the prednisone  has not been working for your breathing so we can hold off on that today.  I suspect that a lot of your coughing is related to not well-controlled allergies.  Please get back on your nasal spray Ryaltris  per Dr. Tempie Fee.  Continue the Xyzal .  I also recommend getting back on montelukast  but of course you can discuss that with Dr. Tempie Fee.  Reflux can also worsen the cough that you are having so keep taking the pantoprazole.  We will set you up with a new pulmonologist.  I also recommend following up with Dr. Tempie Fee since your allergies are likely not well-controlled year-round and I suspect are causing your asthma to be worse.  You may also benefit from injectable medications for your asthma.

## 2023-10-18 NOTE — Telephone Encounter (Signed)
 Copied from CRM [id number]. Topic: Clinical - Red Word Triage >> Oct 18, 2023  8:27 AM Juliana Ocean wrote: Red Word that prompted transfer to Nurse Triage: SOB/cough/chest pain w/ cough been 2 ED 2 X in a week    Chief Complaint: Shortness of Breath Symptoms: shortness of breath, very dry cough,,nausea, vomiting, fatigue Frequency: a little over a week ago Pertinent Negatives: Patient denies fever, coughing up blood Disposition: [] ED /[] Urgent Care (no appt availability in office) / [] Appointment(In office/virtual)/ []  [facility]/ [] Home Care/ [x] Refused Recommended Disposition /[] [facility]/ []  Follow-up with PCP Additional Notes: Patient called and advised that two weeks ago she went to the Emergency Room and was admitted. She states that she went back to the Hospital 3 or so days ago and she states that she is still not feeling good. She states she is still having a lot of trouble breathing, cough, and chest pain when she coughs. She denies a fever or coughing up blood. She was advised to see her Pulmonologist.  She states that she is still experiencing shortness of breath, a very dry cough, nausea, vomiting, fatigue, and soreness in her chest only present when coughing. She had vomiting and received Reglan  in the Emergency Room a few days ago for the vomiting and states she usually received Phenergan  and she didn't get any kind of prescription nausea medication and she still is experiencing nausea as well.  Patient is advised that the recommendation at this time is that she goes back to the Emergency Room.  [phone] is the patient's contact number.  Patient is advised that if she gets worse to go to the ER or call an ambulance if needed.  Patient was okay with this.   She is advised that someone will contact her fro the Pulmonary Office.       Reason for Disposition  [1] MODERATE difficulty breathing (e.g., speaks in phrases, SOB even at rest, pulse 100-120)  AND [2] NEW-onset or WORSE than normal  Answer Assessment - Initial Assessment Questions E2C2 Pulmonary Triage - Initial Assessment Questions "Chief Complaint (e.g., cough, sob, wheezing, fever, chills, sweat or additional symptoms) *Go to specific symptom protocol after initial questions. Shortness of breath, cough, chest soreness only when coughing  "How long have symptoms been present?" Over a week  Have you tested for COVID or Flu? Note: If not, ask patient if a home test can be taken. If so, instruct patient to call back for positive results. No  MEDICINES:   "Have you used any OTC meds to help with symptoms?" Yes If yes, ask "What medications?" Nyquil and Dayquil  "Have you used your inhalers/maintenance medication?" Yes If yes, "What medications?" Albuterol  Neb-- Take 2.5 mg by nebulization every 6 (six) hours. Shortness of breath Albuterol  Inhaler-- Inhale 2 puffs into the lungs daily as needed for shortness of breath or wheezing Advair  -- Inhale 1 puff into the lungs in the morning and at bedtime Duoneb Neb--Take 3 mLs by nebulization every 6 (six) hours as needed  If inhaler, ask "How many puffs and how often?" Note: Review instructions on medication in the chart. Albuterol  Neb-- Take 2.5 mg by nebulization every 6 (six) hours. Shortness of breath Albuterol  Inhaler-- Inhale 2 puffs into the lungs daily as needed for shortness of breath or wheezing Advair  -- Inhale 1 puff into the lungs in the morning and at bedtime Duoneb Neb--Take 3 mLs by nebulization every 6 (six) hours as needed  OXYGEN: "Do you wear  supplemental oxygen?" No If yes, "How many liters are you supposed to use?" ----  "Do you monitor your oxygen levels?" No If yes, "What is your reading (oxygen level) today?" ----  "What is your usual oxygen saturation reading?"  (Note: Pulmonary O2 sats should be 90% or greater) ---    3. PATTERN "Does the difficult breathing come and go, or has it been constant  since it started?"      Worse she has felt in two years 4. SEVERITY: "How bad is your breathing?" (e.g., mild, moderate, severe)    - MILD: No SOB at rest, mild SOB with walking, speaks normally in sentences, can lie down, no retractions, pulse < 100.    - MODERATE: SOB at rest, SOB with minimal exertion and prefers to sit, cannot lie down flat, speaks in phrases, mild retractions, audible wheezing, pulse 100-120.    - SEVERE: Very SOB at rest, speaks in single words, struggling to breathe, sitting hunched forward, retractions, pulse > 120      Moderately 5. RECURRENT SYMPTOM: "Have you had difficulty breathing before?" If Yes, ask: "When was the last time?" and "What happened that time?"      Two years ago when she first was referred here 6. CARDIAC HISTORY: "Do you have any history of heart disease?" (e.g., heart attack, angina, bypass surgery, angioplasty)       No 7. LUNG HISTORY: "Do you have any history of lung disease?"  (e.g., pulmonary embolus, asthma, emphysema)     Asthma 8. CAUSE: "What do you think is causing the breathing problem?"      unknown 9. OTHER SYMPTOMS: "Do you have any other symptoms? (e.g., dizziness, runny nose, cough, chest pain, fever)     Cough and chest soreness 10. O2 SATURATION MONITOR:  "Do you use an oxygen saturation monitor (pulse oximeter) at home?" If Yes, ask: "What is your reading (oxygen level) today?" "What is your usual oxygen saturation reading?" (e.g., 95%)       ---- 11. PREGNANCY: "Is there any chance you are pregnant?" "When was your last menstrual period?"       No  Protocols used: Breathing Difficulty-A-AH

## 2023-10-18 NOTE — Progress Notes (Signed)
 Laurie Reed    [id number]    10/30/1985  Primary Care Physician:Avbuere, Heinz Llano, MD Date of Appointment: 10/18/2023 Established Patient Visit  Chief complaint:   Chief Complaint  Patient presents with   Acute Visit     HPI: Laurie Reed is a 38 y.o.a woman with asthma since childhood requiring ICU stay in July 2023. 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 Was discharged on maintenance therapies below. Respiratory viral panel was negative. Procalcitonin was low.   Interval Updates: Here after two years without being seen for an acute visit.   11 ED visits in 2 years. Multiple antibiotics and courses of prednisone  - looks like 6 in the last year.   Was hospitalized at the last visit in May 2025. Has finished steroids, abx. She doesn't know what her triggers are. She is quite frustrated by her symptoms and failure to improve with steroids and nebulizer treatments.  She does have a nebulizer machine at home and is using it every 4 hours.  Ongoing coughing, chest tightness, wheezing    Out of nasal sprays. Says the Ryaltris  Dr. Tempie Fee put her on is in the mail. She says she was taken off the montelukast  and put on xyzal  instead.   Current Regimen: advair  500 1 puff twice daily Asthma Triggers: URIs, chemical smells, dust Exacerbations in the last year:  minimum 6 in the last year History of hospitalization or intubation: one admission July 2023 (previously had ED visits.) Allergy  Testing: never had GERD: yes on H2 blocker Allergic Rhinitis: xyzal , flonase  ACT:  Asthma Control Test ACT Total Score  06/16/2022 10:00 AM 21  04/23/2022 11:49 AM 8  12/24/2021 11:44 AM 13   FeNO: obtained 12/24/21 but she was unable to complete maneuver   Overall asthma has worsened as she got older. All 4 children have asthma.  Never smoker, no passive smoke exposure.   Lives at home with her 4  children.  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   I have reviewed the patient's family social and past medical history and updated as appropriate.   Past Medical History:  Diagnosis Date   Acid reflux 2004   Anemia    Anxiety 2008   Asthma 1993   no previous intubations or hospitalizations    Carpal tunnel syndrome    Chlamydia    Depression 2008   no meds, currenly ok   Eczema    Environmental allergies    GERD (gastroesophageal reflux disease)    Migraines    Miscarriage    Ovarian cyst    Pre-diabetes    Ulcer of the stomach and intestine 2004   Urinary tract infection     Past Surgical History:  Procedure Laterality Date   CARPAL TUNNEL RELEASE Left    COLONOSCOPY  2004   IRRIGATION AND DEBRIDEMENT SEBACEOUS CYST N/A 06/01/2017   Procedure: EXCISION SEBACEOUS CYST ON SCALP;  Surgeon: [name], MD;  Location: [facility];  Service: General;  Laterality: N/A;   MULTIPLE TOOTH EXTRACTIONS  2015   6 teeth removed    TUBAL LIGATION N/A 04/04/2018   Procedure: POST PARTUM TUBAL LIGATION;  Surgeon: [name], DO;  Location: [facility];  Service: Gynecology;  Laterality: N/A;   TYMPANOSTOMY TUBE PLACEMENT     WISDOM TOOTH EXTRACTION  2007    Family History  Problem Relation Age of Onset  Asthma Mother    Heart disease Mother    Hypertension Mother    Clotting disorder Mother    Diabetes Mother    Osteoarthritis Mother    Other Mother        DDD   High Cholesterol Mother    Anemia Mother    Healthy Brother    Asthma Maternal Aunt    Hypertension Maternal Aunt    Diabetes Maternal Aunt    Asthma Maternal Uncle    Hypertension Maternal Uncle    Diabetes Maternal Uncle    Diabetes Maternal Uncle    Heart disease Maternal Uncle    Diabetes Maternal Uncle    Cancer Paternal Aunt    Heart disease Maternal Grandmother    Diabetes Maternal Grandmother    Hypertension Maternal Grandmother    Heart disease Maternal Grandfather     Diabetes Maternal Grandfather    Hypertension Maternal Grandfather    Hypertension Paternal Grandmother    Hypertension Son    ADD / ADHD Son    Asthma Son    Allergies Son    Allergies Son    Asthma Son    GER disease Son    Allergies Daughter    Asthma Daughter    Migraines Daughter    Allergies Daughter    Migraines Daughter    Asthma Daughter    Hypotension Neg Hx    Anesthesia problems Neg Hx    Malignant hyperthermia Neg Hx    Pseudochol deficiency Neg Hx     Social History   Occupational History    Employer: [employer]  Tobacco Use   Smoking status: Never    Passive exposure: Never   Smokeless tobacco: Never  Vaping Use   Vaping status: Never Used  Substance and Sexual Activity   Alcohol use: No    Alcohol/week: 0.0 standard drinks of alcohol   Drug use: No   Sexual activity: Not on file     Physical Exam: Blood pressure 104/72, pulse 91, height 5\' 2"  (1.575 m), weight 283 lb (128.4 kg), last menstrual period 10/07/2023, SpO2 100%.  Gen:     Fatigued, frequent coughing ENT:  cobblestoning oropharyngeal erythema Lungs:    No increased respiratory effort, symmetric chest wall excursion, clear to auscultation bilaterally, no wheezes or crackles CV:         tachycardic   Data Reviewed: Imaging: I have personally reviewed the CTPE study July 2023 - no acute cardiopulmonary process  PFTs:      No data to display         I have personally reviewed the patient's PFTs and spirometry shows no airflow limitation, suggests restriction.   Labs: Labs reviewed - absolute eosinophil coung 600 in April 2020, was 300 in July 2023 while on steroids.   Immunization status: Immunization History  Administered Date(s) Administered   Influenza,inj,Quad PF,6+ Mos 03/18/2019   PPD Test 12/31/2015, 01/12/2016   Pneumococcal Polysaccharide-23 04/12/2013   Tdap 04/11/2013, 01/06/2018, 03/18/2023    External Records Personally Reviewed: hospital  stay  Assessment:  Severe persistent asthma, with recent hospitalization Peripheral eosinophilia Chronic Allergic Rhinitis, not well controlled GERD   Plan/Recommendations: Sorry to hear you have been feeling well.  I suspect your asthma is not well-controlled although we are not sure what keeps triggering it.  I recommend staying on the Advair  1 puff twice daily, gargle after use continue your albuterol  nebulizers and treatments.  I offered extending your steroids today but you said that the  prednisone  has not been working for your breathing so we can hold off on that today.  I suspect that a lot of your coughing is related to not well-controlled allergies.  Please get back on your nasal spray Ryaltris  per Dr. Tempie Fee.  Continue the Xyzal .  I also recommend getting back on montelukast  but of course you can discuss that with Dr. Tempie Fee.  Reflux can also worsen the cough that you are having so keep taking the pantoprazole.  We will set you up with a new pulmonologist.  I also recommend following up with Dr. Tempie Fee since your allergies are likely not well-controlled year-round and I suspect are causing your asthma to be worse.  You may also benefit from injectable medications for your asthma.   I spent 40 minutes in the care of this patient today including pre-charting, chart review, review of results, face-to-face care, coordination of care and communication with consultants etc.).    Return to Care: Return in about 4 weeks (around 11/15/2023) for with new pulmonary physician.   Louie Rover, MD Pulmonary and Critical Care Medicine [facility] Office:[phone]

## 2023-10-28 ENCOUNTER — Other Ambulatory Visit (HOSPITAL_COMMUNITY): Payer: Self-pay

## 2023-10-28 MED ORDER — OXYCODONE-ACETAMINOPHEN 5-325 MG PO TABS
1.0000 | ORAL_TABLET | ORAL | 0 refills | Status: AC | PRN
  Filled 2023-10-28: qty 25, 5d supply, fill #0

## 2023-11-24 ENCOUNTER — Other Ambulatory Visit (HOSPITAL_COMMUNITY): Payer: Self-pay

## 2023-11-24 MED ORDER — OXYCODONE-ACETAMINOPHEN 5-325 MG PO TABS
1.0000 | ORAL_TABLET | ORAL | 0 refills | Status: AC
  Filled 2023-11-24: qty 25, 5d supply, fill #0

## 2023-11-25 ENCOUNTER — Other Ambulatory Visit (HOSPITAL_COMMUNITY): Payer: Self-pay

## 2023-12-23 ENCOUNTER — Other Ambulatory Visit (HOSPITAL_COMMUNITY): Payer: Self-pay

## 2023-12-23 MED ORDER — OXYCODONE-ACETAMINOPHEN 5-325 MG PO TABS
1.0000 | ORAL_TABLET | ORAL | 0 refills | Status: DC | PRN
  Filled 2023-12-23: qty 25, 5d supply, fill #0

## 2024-01-07 ENCOUNTER — Other Ambulatory Visit (HOSPITAL_COMMUNITY): Payer: Self-pay

## 2024-01-07 ENCOUNTER — Other Ambulatory Visit: Payer: Self-pay | Admitting: Internal Medicine

## 2024-01-07 ENCOUNTER — Other Ambulatory Visit: Payer: Self-pay | Admitting: Allergy

## 2024-01-07 DIAGNOSIS — R072 Precordial pain: Secondary | ICD-10-CM

## 2024-01-07 DIAGNOSIS — R1084 Generalized abdominal pain: Secondary | ICD-10-CM

## 2024-01-07 DIAGNOSIS — R1013 Epigastric pain: Secondary | ICD-10-CM

## 2024-01-07 DIAGNOSIS — K219 Gastro-esophageal reflux disease without esophagitis: Secondary | ICD-10-CM

## 2024-01-09 ENCOUNTER — Other Ambulatory Visit: Payer: Self-pay

## 2024-01-09 ENCOUNTER — Other Ambulatory Visit (HOSPITAL_COMMUNITY): Payer: Self-pay

## 2024-01-09 MED ORDER — GABAPENTIN 100 MG PO CAPS
100.0000 mg | ORAL_CAPSULE | Freq: Three times a day (TID) | ORAL | 0 refills | Status: AC
  Filled 2024-01-09: qty 90, 30d supply, fill #0

## 2024-01-09 MED ORDER — TOPIRAMATE 100 MG PO TABS
100.0000 mg | ORAL_TABLET | Freq: Every day | ORAL | 0 refills | Status: AC
  Filled 2024-01-09: qty 30, 30d supply, fill #0

## 2024-01-09 MED ORDER — SUMATRIPTAN SUCCINATE 100 MG PO TABS
100.0000 mg | ORAL_TABLET | ORAL | 0 refills | Status: AC | PRN
  Filled 2024-01-09: qty 9, 30d supply, fill #0

## 2024-01-10 ENCOUNTER — Encounter (HOSPITAL_COMMUNITY): Payer: Self-pay

## 2024-01-10 ENCOUNTER — Other Ambulatory Visit (HOSPITAL_COMMUNITY): Payer: Self-pay

## 2024-01-14 ENCOUNTER — Other Ambulatory Visit (HOSPITAL_COMMUNITY): Payer: Self-pay

## 2024-01-16 ENCOUNTER — Other Ambulatory Visit (HOSPITAL_COMMUNITY): Payer: Self-pay

## 2024-01-16 MED ORDER — OXYCODONE-ACETAMINOPHEN 5-325 MG PO TABS
1.0000 | ORAL_TABLET | ORAL | 0 refills | Status: DC | PRN
  Filled 2024-01-16: qty 25, 5d supply, fill #0

## 2024-01-17 ENCOUNTER — Other Ambulatory Visit (HOSPITAL_COMMUNITY): Payer: Self-pay

## 2024-01-18 ENCOUNTER — Other Ambulatory Visit (HOSPITAL_COMMUNITY): Payer: Self-pay

## 2024-02-27 ENCOUNTER — Other Ambulatory Visit (HOSPITAL_COMMUNITY): Payer: Self-pay

## 2024-02-27 MED ORDER — OXYCODONE-ACETAMINOPHEN 5-325 MG PO TABS
1.0000 | ORAL_TABLET | ORAL | 0 refills | Status: DC | PRN
  Filled 2024-02-27: qty 25, 5d supply, fill #0

## 2024-03-25 ENCOUNTER — Other Ambulatory Visit (HOSPITAL_COMMUNITY): Payer: Self-pay

## 2024-03-25 MED ORDER — OXYCODONE-ACETAMINOPHEN 5-325 MG PO TABS
1.0000 | ORAL_TABLET | ORAL | 0 refills | Status: DC | PRN
  Filled 2024-03-25: qty 25, 5d supply, fill #0

## 2024-03-26 ENCOUNTER — Other Ambulatory Visit (HOSPITAL_COMMUNITY): Payer: Self-pay

## 2024-04-09 ENCOUNTER — Other Ambulatory Visit (HOSPITAL_COMMUNITY): Payer: Self-pay

## 2024-04-09 MED ORDER — OXYCODONE-ACETAMINOPHEN 5-325 MG PO TABS
1.0000 | ORAL_TABLET | ORAL | 0 refills | Status: DC | PRN
  Filled 2024-04-09: qty 25, 5d supply, fill #0

## 2024-04-16 ENCOUNTER — Emergency Department (HOSPITAL_BASED_OUTPATIENT_CLINIC_OR_DEPARTMENT_OTHER)
Admission: EM | Admit: 2024-04-16 | Discharge: 2024-04-17 | Disposition: A | Attending: Emergency Medicine | Admitting: Emergency Medicine

## 2024-04-16 ENCOUNTER — Other Ambulatory Visit: Payer: Self-pay

## 2024-04-16 DIAGNOSIS — Z91040 Latex allergy status: Secondary | ICD-10-CM | POA: Diagnosis not present

## 2024-04-16 DIAGNOSIS — R1032 Left lower quadrant pain: Secondary | ICD-10-CM | POA: Diagnosis present

## 2024-04-16 DIAGNOSIS — R1024 Suprapubic pain: Secondary | ICD-10-CM

## 2024-04-16 DIAGNOSIS — N3001 Acute cystitis with hematuria: Secondary | ICD-10-CM | POA: Diagnosis not present

## 2024-04-16 LAB — COMPREHENSIVE METABOLIC PANEL WITH GFR
ALT: 9 U/L (ref 0–44)
AST: 15 U/L (ref 15–41)
Albumin: 3.9 g/dL (ref 3.5–5.0)
Alkaline Phosphatase: 84 U/L (ref 38–126)
Anion gap: 9 (ref 5–15)
BUN: 11 mg/dL (ref 6–20)
CO2: 26 mmol/L (ref 22–32)
Calcium: 9.1 mg/dL (ref 8.9–10.3)
Chloride: 104 mmol/L (ref 98–111)
Creatinine, Ser: 0.89 mg/dL (ref 0.44–1.00)
GFR, Estimated: >60 mL/min (ref >60)
Glucose, Bld: 77 mg/dL (ref 70–99)
Potassium: 3.9 mmol/L (ref 3.5–5.1)
Sodium: 139 mmol/L (ref 135–145)
Total Bilirubin: 0.3 mg/dL (ref 0.0–1.2)
Total Protein: 7.4 g/dL (ref 6.5–8.1)

## 2024-04-16 LAB — CBC
HCT: 37.1 % (ref 36.0–46.0)
Hemoglobin: 11.7 g/dL — ABNORMAL LOW (ref 12.0–15.0)
MCH: 25.7 pg — ABNORMAL LOW (ref 26.0–34.0)
MCHC: 31.5 g/dL (ref 30.0–36.0)
MCV: 81.4 fL (ref 80.0–100.0)
Platelets: 337 K/uL (ref 150–400)
RBC: 4.56 MIL/uL (ref 3.87–5.11)
RDW: 14.3 % (ref 11.5–15.5)
WBC: 10.3 K/uL (ref 4.0–10.5)
nRBC: 0 % (ref 0.0–0.2)

## 2024-04-16 LAB — LIPASE, BLOOD: Lipase: 25 U/L (ref 11–51)

## 2024-04-16 LAB — PREGNANCY, URINE: Preg Test, Ur: NEGATIVE

## 2024-04-16 NOTE — ED Provider Notes (Incomplete)
 [facility] Provider Note   CSN: [id number] Arrival date & time: 04/16/24  2313     Patient presents with: Abdominal Pain   Laurie Reed is a 38 y.o. female.  {Add pertinent medical, surgical, social history, OB history to HPI:32947}  Abdominal Pain      Prior to Admission medications   Medication Sig Start Date End Date Taking? Authorizing Provider  acetaminophen  (TYLENOL ) 500 MG tablet Take 1,000 mg by mouth every 6 (six) hours as needed for mild pain (pain score 1-3), moderate pain (pain score 4-6) or headache (or migraines).    [provider]  albuterol  (PROVENTIL ) (2.5 MG/3ML) 0.083% nebulizer solution Use 1 vial via nebulizer every 6 hours and as needed Patient taking differently: Take 2.5 mg by nebulization every 6 (six) hours. Shortness of breath 05/15/21     albuterol  (VENTOLIN  HFA) 108 (90 Base) MCG/ACT inhaler Inhale 2 puffs into the lungs 4 (four) times daily as needed. Patient taking differently: Inhale 2 puffs into the lungs daily as needed for shortness of breath or wheezing. 01/28/23     amitriptyline  (ELAVIL ) 25 MG tablet Take 1 tablet (25 mg total) by mouth at bedtime as needed. Patient taking differently: Take 25 mg by mouth at bedtime as needed for sleep. 12/08/22     cetirizine  (ZYRTEC ) 10 MG tablet Take 1 tablet (10 mg total) by mouth every evening as needed for allergies. 12/08/22     diphenhydrAMINE  (BENADRYL ) 25 mg capsule Take 25-50 mg by mouth 2 (two) times daily as needed for allergies.    [provider]  doxycycline  (VIBRA -TABS) 100 MG tablet Take 1 tablet (100 mg total) by mouth every 12 (twelve) hours. 10/11/23   Jadine Toribio SQUIBB, MD  ferrous sulfate  324 (65 Fe) MG TBEC 1 pill daily after meal to help with anemia 11/22/18   [name], [name], MD  fluticasone -salmeterol (ADVAIR  DISKUS) 500-50 MCG/ACT AEPB Inhale 1 puff into the lungs in the morning and at bedtime. 01/29/23   Meade Verdon RAMAN, MD   gabapentin  (NEURONTIN ) 100 MG capsule Take 1 capsule (100 mg total) by mouth 3 (three) times daily. 01/09/24     HYDROcodone  bit-homatropine (HYCODAN) 5-1.5 MG/5ML syrup Take 5 mLs by mouth every 6 (six) hours as needed for cough. 10/18/23   Desai, Nikita S, MD  ipratropium-albuterol  (DUONEB) 0.5-2.5 (3) MG/3ML SOLN Take 3 mLs by nebulization every 6 (six) hours as needed. 07/05/23   Mecum, Erin E, PA-C  levocetirizine (XYZAL ) 5 MG tablet Take 1 tablet (5 mg total) by mouth every evening. 06/16/22   Padgett, Danita Macintosh, MD  meloxicam  (MOBIC ) 15 MG tablet Take 1 tablet (15 mg total) by mouth daily. Patient taking differently: Take 15 mg by mouth as needed. 09/14/22     omeprazole  (PRILOSEC) 40 MG capsule Take 1 capsule (40 mg total) by mouth daily. 12/24/21 12/24/22  Meade Verdon RAMAN, MD  oxyCODONE -acetaminophen  (PERCOCET/ROXICET) 5-325 MG tablet Take 1 tablet by mouth every 4-6 (four to six) hours as needed for pain. 10/28/23     oxyCODONE -acetaminophen  (PERCOCET/ROXICET) 5-325 MG tablet Take 1 tablet by mouth every 4 (four) to 6 (six) hours as needed for pain for 5 days. 11/23/23     oxyCODONE -acetaminophen  (PERCOCET/ROXICET) 5-325 MG tablet Take 1 tablet by mouth every 4-6 (four to six) hours as needed for pain for 5 days. 04/09/24     phentermine  (ADIPEX-P ) 37.5 MG tablet Take 1 tablet (37.5 mg total) by mouth in the morning. 12/08/22  RYALTRIS  665-25 MCG/ACT SUSP 2 sprays per nostril 1-2 times daily. 06/16/22   Jeneal Danita Macintosh, MD  SUMAtriptan  (IMITREX ) 100 MG tablet Take 1 tablet (100 mg total) by mouth as needed at the onset of migraine symptoms. May repeat in 1-2 (one to two) hours if persistent. Max daily dose 200 mg. 01/09/24     tiZANidine  (ZANAFLEX ) 4 MG tablet Take 1 tablet (4 mg total) by mouth at bedtime. 11/20/22   Curatolo, Adam, DO  topiramate  (TOPAMAX ) 100 MG tablet Take 1 tablet (100 mg total) by mouth daily. 01/09/24     triamcinolone  cream (KENALOG ) 0.1 % Apply 1 Application  topically 2 (two) times daily as needed. 11/17/22     Vitamin D , Ergocalciferol , 50000 units CAPS Take 1 capsule (50,000 Units) by mouth once a week. 12/08/22     dicyclomine  (BENTYL ) 10 MG capsule Take 1 capsule (10 mg total) by mouth 4 (four) times daily -  before meals and at bedtime. For stomach cramping Patient not taking: Reported on 10/25/2019 06/13/19 10/25/19  [name], Lannie, MD  sucralfate  (CARAFATE ) 1 GM/10ML suspension Take 10 mLs (1 g total) by mouth 4 (four) times daily -  with meals and at bedtime. Patient not taking: Reported on 10/25/2019 06/13/19 10/25/19  Alec Lannie, MD    Allergies: Latex, Other, Zofran  [ondansetron ], and Tree extract    Review of Systems  Gastrointestinal:  Positive for abdominal pain.    Updated Vital Signs BP (!) 126/57   Pulse 89   Temp 97.6 F (36.4 C) (Oral)   Resp 17   Ht 5' 2 (1.575 m)   Wt 122.5 kg   SpO2 100%   BMI 49.38 kg/m   Physical Exam  (all labs ordered are listed, but only abnormal results are displayed) Labs Reviewed  LIPASE, BLOOD  COMPREHENSIVE METABOLIC PANEL WITH GFR  CBC  URINALYSIS, ROUTINE W REFLEX MICROSCOPIC  PREGNANCY, URINE    EKG: None  Radiology: No results found.  {Document cardiac monitor, telemetry assessment procedure when appropriate:32947} Procedures   Medications Ordered in the ED - No data to display    {Click here for ABCD2, HEART and other calculators REFRESH Note before signing:1}                              Medical Decision Making Amount and/or Complexity of Data Reviewed Labs: ordered.   ***  {Document critical care time when appropriate  Document review of labs and clinical decision tools ie CHADS2VASC2, etc  Document your independent review of radiology images and any outside records  Document your discussion with family members, caretakers and with consultants  Document social determinants of health affecting pt's care  Document your decision making why or why not admission,  treatments were needed:32947:::1}   Final diagnoses:  None    ED Discharge Orders     None

## 2024-04-16 NOTE — ED Triage Notes (Signed)
 Pt POV reporting lower abd cramping since yesterday, endorses nausea, last BM today.

## 2024-04-17 ENCOUNTER — Other Ambulatory Visit (HOSPITAL_COMMUNITY): Payer: Self-pay

## 2024-04-17 ENCOUNTER — Emergency Department (HOSPITAL_BASED_OUTPATIENT_CLINIC_OR_DEPARTMENT_OTHER)

## 2024-04-17 LAB — URINALYSIS, ROUTINE W REFLEX MICROSCOPIC
Bilirubin Urine: NEGATIVE
Glucose, UA: NEGATIVE mg/dL
Ketones, ur: NEGATIVE mg/dL
Nitrite: NEGATIVE
Protein, ur: NEGATIVE mg/dL
Specific Gravity, Urine: 1.024 (ref 1.005–1.030)
pH: 5.5 (ref 5.0–8.0)

## 2024-04-17 LAB — URINE DRUG SCREEN
Amphetamines: NEGATIVE
Barbiturates: NEGATIVE
Benzodiazepines: NEGATIVE
Cocaine: NEGATIVE
Fentanyl: NEGATIVE
Methadone Scn, Ur: NEGATIVE
Opiates: NEGATIVE
Tetrahydrocannabinol: NEGATIVE

## 2024-04-17 MED ORDER — CEPHALEXIN 500 MG PO CAPS: 500.0000 mg | ORAL_CAPSULE | Freq: Two times a day (BID) | ORAL | 0 refills | Status: DC

## 2024-04-17 MED ORDER — KETOROLAC TROMETHAMINE 10 MG PO TABS: 10.0000 mg | ORAL_TABLET | Freq: Four times a day (QID) | ORAL | 0 refills | Status: DC | PRN

## 2024-04-17 MED ORDER — DIPHENHYDRAMINE HCL 50 MG/ML IJ SOLN
12.5000 mg | Freq: Once | INTRAMUSCULAR | Status: AC
  Administered 2024-04-17: 12.5 mg via INTRAVENOUS
  Filled 2024-04-17: qty 1

## 2024-04-17 MED ORDER — IOHEXOL 300 MG/ML  SOLN
100.0000 mL | Freq: Once | INTRAMUSCULAR | Status: AC | PRN
  Administered 2024-04-17: 100 mL via INTRAVENOUS

## 2024-04-17 MED ORDER — CEPHALEXIN 500 MG PO CAPS
500.0000 mg | ORAL_CAPSULE | Freq: Two times a day (BID) | ORAL | 0 refills | Status: AC
  Filled 2024-04-17: qty 10, 5d supply, fill #0

## 2024-04-17 MED ORDER — METOCLOPRAMIDE HCL 5 MG/ML IJ SOLN
10.0000 mg | Freq: Once | INTRAMUSCULAR | Status: AC
  Administered 2024-04-17: 10 mg via INTRAVENOUS
  Filled 2024-04-17: qty 2

## 2024-04-17 MED ORDER — FOSFOMYCIN TROMETHAMINE 3 G PO PACK
3.0000 g | PACK | Freq: Once | ORAL | Status: AC
  Administered 2024-04-17: 3 g via ORAL
  Filled 2024-04-17: qty 3

## 2024-04-17 MED ORDER — LACTATED RINGERS IV BOLUS
1000.0000 mL | Freq: Once | INTRAVENOUS | Status: AC
  Administered 2024-04-17: 1000 mL via INTRAVENOUS

## 2024-04-17 MED ORDER — FENTANYL CITRATE (PF) 50 MCG/ML IJ SOSY
50.0000 ug | PREFILLED_SYRINGE | Freq: Once | INTRAMUSCULAR | Status: AC
  Administered 2024-04-17: 50 ug via INTRAVENOUS
  Filled 2024-04-17: qty 1

## 2024-04-17 MED ORDER — KETOROLAC TROMETHAMINE 10 MG PO TABS
10.0000 mg | ORAL_TABLET | Freq: Four times a day (QID) | ORAL | 0 refills | Status: AC | PRN
  Filled 2024-04-17: qty 20, 5d supply, fill #0

## 2024-04-17 NOTE — Discharge Instructions (Addendum)
 Your CT imaging, ultrasound of the abdomen and pelvis were overall reassuring with no acute findings noted.  Your laboratory evaluation was overall reassuring as well.  Your urinalysis did show evidence of a urinary tract infection for which we will treat with a course of Keflex .  Follow-up with your primary care provider to ensure resolution.

## 2024-04-18 ENCOUNTER — Other Ambulatory Visit (HOSPITAL_COMMUNITY): Payer: Self-pay

## 2024-04-18 ENCOUNTER — Other Ambulatory Visit: Payer: Self-pay

## 2024-04-25 ENCOUNTER — Other Ambulatory Visit (HOSPITAL_COMMUNITY): Payer: Self-pay

## 2024-04-25 MED ORDER — OXYCODONE-ACETAMINOPHEN 5-325 MG PO TABS
1.0000 | ORAL_TABLET | ORAL | 0 refills | Status: AC | PRN
  Filled 2024-04-25: qty 20, 4d supply, fill #0

## 2024-05-12 ENCOUNTER — Encounter (HOSPITAL_BASED_OUTPATIENT_CLINIC_OR_DEPARTMENT_OTHER): Payer: Self-pay | Admitting: Emergency Medicine

## 2024-05-12 ENCOUNTER — Emergency Department (HOSPITAL_BASED_OUTPATIENT_CLINIC_OR_DEPARTMENT_OTHER)
Admission: EM | Admit: 2024-05-12 | Discharge: 2024-05-13 | Disposition: A | Discharge status: Home / Self Care | Attending: Emergency Medicine | Admitting: Emergency Medicine

## 2024-05-12 ENCOUNTER — Other Ambulatory Visit: Payer: Self-pay

## 2024-05-12 DIAGNOSIS — J45909 Unspecified asthma, uncomplicated: Secondary | ICD-10-CM | POA: Insufficient documentation

## 2024-05-12 DIAGNOSIS — G43809 Other migraine, not intractable, without status migrainosus: Secondary | ICD-10-CM | POA: Insufficient documentation

## 2024-05-12 DIAGNOSIS — R519 Headache, unspecified: Secondary | ICD-10-CM | POA: Diagnosis present

## 2024-05-12 LAB — CBC WITH DIFFERENTIAL/PLATELET
Abs Immature Granulocytes: 0.02 K/uL (ref 0.00–0.07)
Basophils Absolute: 0 K/uL (ref 0.0–0.1)
Basophils Relative: 0 %
Eosinophils Absolute: 0.4 K/uL (ref 0.0–0.5)
Eosinophils Relative: 4 %
HCT: 35.3 % — ABNORMAL LOW (ref 36.0–46.0)
Hemoglobin: 11.2 g/dL — ABNORMAL LOW (ref 12.0–15.0)
Immature Granulocytes: 0 %
Lymphocytes Relative: 44 %
Lymphs Abs: 4 K/uL (ref 0.7–4.0)
MCH: 25.9 pg — ABNORMAL LOW (ref 26.0–34.0)
MCHC: 31.7 g/dL (ref 30.0–36.0)
MCV: 81.5 fL (ref 80.0–100.0)
Monocytes Absolute: 0.6 K/uL (ref 0.1–1.0)
Monocytes Relative: 7 %
Neutro Abs: 4.1 K/uL (ref 1.7–7.7)
Neutrophils Relative %: 45 %
Platelets: 330 K/uL (ref 150–400)
RBC: 4.33 MIL/uL (ref 3.87–5.11)
RDW: 14 % (ref 11.5–15.5)
WBC: 9.2 K/uL (ref 4.0–10.5)
nRBC: 0 % (ref 0.0–0.2)

## 2024-05-12 LAB — RESP PANEL BY RT-PCR (RSV, FLU A&B, COVID)  RVPGX2
Influenza A by PCR: NEGATIVE
Influenza B by PCR: NEGATIVE
Resp Syncytial Virus by PCR: NEGATIVE
SARS Coronavirus 2 by RT PCR: NEGATIVE

## 2024-05-12 LAB — BASIC METABOLIC PANEL WITH GFR
Anion gap: 8 (ref 5–15)
BUN: 10 mg/dL (ref 6–20)
CO2: 26 mmol/L (ref 22–32)
Calcium: 9.1 mg/dL (ref 8.9–10.3)
Chloride: 106 mmol/L (ref 98–111)
Creatinine, Ser: 0.73 mg/dL (ref 0.44–1.00)
GFR, Estimated: >60 mL/min
Glucose, Bld: 86 mg/dL (ref 70–99)
Potassium: 3.8 mmol/L (ref 3.5–5.1)
Sodium: 140 mmol/L (ref 135–145)

## 2024-05-12 NOTE — ED Triage Notes (Signed)
 Headache  Pain and pressure on left side of head  Its been a while

## 2024-05-13 ENCOUNTER — Emergency Department (HOSPITAL_BASED_OUTPATIENT_CLINIC_OR_DEPARTMENT_OTHER)

## 2024-05-13 MED ORDER — IOHEXOL 350 MG/ML SOLN
75.0000 mL | Freq: Once | INTRAVENOUS | Status: AC | PRN
  Administered 2024-05-13: 75 mL via INTRAVENOUS

## 2024-05-13 MED ORDER — PROCHLORPERAZINE EDISYLATE 10 MG/2ML IJ SOLN
10.0000 mg | Freq: Once | INTRAMUSCULAR | Status: AC
  Administered 2024-05-13: 10 mg via INTRAVENOUS
  Filled 2024-05-13: qty 2

## 2024-05-13 MED ORDER — DIPHENHYDRAMINE HCL 50 MG/ML IJ SOLN
12.5000 mg | Freq: Once | INTRAMUSCULAR | Status: AC
  Administered 2024-05-13: 12.5 mg via INTRAVENOUS
  Filled 2024-05-13: qty 1

## 2024-05-13 NOTE — Discharge Instructions (Signed)
 You were seen today for persistent and worsening headache.  Your CT imaging is reassuring.  This is likely a migraine variant given your history.  Will provide referral to neurology given worsening and ongoing nature of headache.  Continue your migraine medications at home as prescribed.

## 2024-05-13 NOTE — ED Provider Notes (Signed)
 " [place] EMERGENCY DEPARTMENT AT [facility] Provider Note   CSN: [id number] Arrival date & time: 05/12/24  2113     Patient presents with: Headache   Laurie Reed is a 38 y.o. female.   HPI     This is a 38 year old female who presents with concern for headache.  Patient reports she has had headache on and off and persistent over the last several months.  She does have a history of migraine and takes Topamax .  She states however this feels different.  She has a headache mostly on the left side that now radiates into her eye.  Denies vision changes.  Denies peripheral vision loss.  States that the headache is worse when she bends over or if she is using the bathroom.  This is new.  Reports family history of aneurysm and this concerns her.  No nausea or vomiting.  Has not ever seen a neurologist.  Prior to Admission medications  Medication Sig Start Date End Date Taking? Authorizing Provider  acetaminophen  (TYLENOL ) 500 MG tablet Take 1,000 mg by mouth every 6 (six) hours as needed for mild pain (pain score 1-3), moderate pain (pain score 4-6) or headache (or migraines).    [provider]  albuterol  (PROVENTIL ) (2.5 MG/3ML) 0.083% nebulizer solution Use 1 vial via nebulizer every 6 hours and as needed Patient taking differently: Take 2.5 mg by nebulization every 6 (six) hours. Shortness of breath 05/15/21     albuterol  (VENTOLIN  HFA) 108 (90 Base) MCG/ACT inhaler Inhale 2 puffs into the lungs 4 (four) times daily as needed. Patient taking differently: Inhale 2 puffs into the lungs daily as needed for shortness of breath or wheezing. 01/28/23     amitriptyline  (ELAVIL ) 25 MG tablet Take 1 tablet (25 mg total) by mouth at bedtime as needed. Patient taking differently: Take 25 mg by mouth at bedtime as needed for sleep. 12/08/22     cetirizine  (ZYRTEC ) 10 MG tablet Take 1 tablet (10 mg total) by mouth every evening as needed for allergies. 12/08/22     diphenhydrAMINE   (BENADRYL ) 25 mg capsule Take 25-50 mg by mouth 2 (two) times daily as needed for allergies.    [provider]  doxycycline  (VIBRA -TABS) 100 MG tablet Take 1 tablet (100 mg total) by mouth every 12 (twelve) hours. 10/11/23   Jadine Toribio SQUIBB, MD  ferrous sulfate  324 (65 Fe) MG TBEC 1 pill daily after meal to help with anemia 11/22/18   [name], [name], MD  fluticasone -salmeterol (ADVAIR  DISKUS) 500-50 MCG/ACT AEPB Inhale 1 puff into the lungs in the morning and at bedtime. 01/29/23   Meade Verdon RAMAN, MD  gabapentin  (NEURONTIN ) 100 MG capsule Take 1 capsule (100 mg total) by mouth 3 (three) times daily. 01/09/24     HYDROcodone  bit-homatropine (HYCODAN) 5-1.5 MG/5ML syrup Take 5 mLs by mouth every 6 (six) hours as needed for cough. 10/18/23   Desai, Nikita S, MD  ipratropium-albuterol  (DUONEB) 0.5-2.5 (3) MG/3ML SOLN Take 3 mLs by nebulization every 6 (six) hours as needed. 07/05/23   Mecum, Erin E, PA-C  ketorolac  (TORADOL ) 10 MG tablet Take 1 tablet (10 mg total) by mouth every 6 (six) hours as needed. 04/17/24   Jerrol Agent, MD  levocetirizine (XYZAL ) 5 MG tablet Take 1 tablet (5 mg total) by mouth every evening. 06/16/22   Padgett, Danita Macintosh, MD  omeprazole  (PRILOSEC) 40 MG capsule Take 1 capsule (40 mg total) by mouth daily. 12/24/21 12/24/22  Meade Verdon RAMAN, MD  oxyCODONE -acetaminophen  (PERCOCET/ROXICET) 5-325 MG tablet Take 1 tablet by mouth every 4-6 (four to six) hours as needed for pain. 10/28/23     oxyCODONE -acetaminophen  (PERCOCET/ROXICET) 5-325 MG tablet Take 1 tablet by mouth every 4 (four) to 6 (six) hours as needed for pain for 5 days. 11/23/23     oxyCODONE -acetaminophen  (PERCOCET/ROXICET) 5-325 MG tablet Take 1 tablet by mouth every 4-6 hours as needed for pain. 04/25/24     phentermine  (ADIPEX-P ) 37.5 MG tablet Take 1 tablet (37.5 mg total) by mouth in the morning. 12/08/22     RYALTRIS  665-25 MCG/ACT SUSP 2 sprays per nostril 1-2 times daily. 06/16/22   Jeneal Danita Macintosh,  MD  SUMAtriptan  (IMITREX ) 100 MG tablet Take 1 tablet (100 mg total) by mouth as needed at the onset of migraine symptoms. May repeat in 1-2 (one to two) hours if persistent. Max daily dose 200 mg. 01/09/24     tiZANidine  (ZANAFLEX ) 4 MG tablet Take 1 tablet (4 mg total) by mouth at bedtime. 11/20/22   Curatolo, Adam, DO  topiramate  (TOPAMAX ) 100 MG tablet Take 1 tablet (100 mg total) by mouth daily. 01/09/24     triamcinolone  cream (KENALOG ) 0.1 % Apply 1 Application topically 2 (two) times daily as needed. 11/17/22     Vitamin D , Ergocalciferol , 50000 units CAPS Take 1 capsule (50,000 Units) by mouth once a week. 12/08/22     dicyclomine  (BENTYL ) 10 MG capsule Take 1 capsule (10 mg total) by mouth 4 (four) times daily -  before meals and at bedtime. For stomach cramping Patient not taking: Reported on 10/25/2019 06/13/19 10/25/19  [name], Lannie, MD  sucralfate  (CARAFATE ) 1 GM/10ML suspension Take 10 mLs (1 g total) by mouth 4 (four) times daily -  with meals and at bedtime. Patient not taking: Reported on 10/25/2019 06/13/19 10/25/19  Alec Lannie, MD    Allergies: Latex, Other, Zofran  [ondansetron ], and Tree extract    Review of Systems  Constitutional:  Negative for fever.  Eyes:  Negative for photophobia and visual disturbance.  Neurological:  Positive for headaches. Negative for dizziness.  All other systems reviewed and are negative.   Updated Vital Signs BP 100/64 (BP Location: Right Arm)   Pulse 92   Temp 98.6 F (37 C) (Oral)   Resp 18   LMP  (LMP Unknown)   SpO2 96%   Physical Exam Vitals and nursing note reviewed.  Constitutional:      Appearance: She is well-developed. She is obese. She is not ill-appearing.  HENT:     Head: Normocephalic and atraumatic.  Eyes:     General: No scleral icterus.    Pupils: Pupils are equal, round, and reactive to light.  Cardiovascular:     Rate and Rhythm: Normal rate and regular rhythm.     Heart sounds: Normal heart sounds.  Pulmonary:      Effort: Pulmonary effort is normal. No respiratory distress.     Breath sounds: No wheezing.  Abdominal:     General: Bowel sounds are normal.     Palpations: Abdomen is soft.  Musculoskeletal:     Cervical back: Neck supple.  Skin:    General: Skin is warm and dry.  Neurological:     Mental Status: She is alert and oriented to person, place, and time.     Comments: Cranial nerves II through XII intact, 5 out of 5 strength in all 4 extremities, no dysmetria finger-nose-finger  Psychiatric:        Mood and Affect:  Mood normal.     (all labs ordered are listed, but only abnormal results are displayed) Labs Reviewed  CBC WITH DIFFERENTIAL/PLATELET - Abnormal; Notable for the following components:      Result Value   Hemoglobin 11.2 (*)    HCT 35.3 (*)    MCH 25.9 (*)    All other components within normal limits  RESP PANEL BY RT-PCR (RSV, FLU A&B, COVID)  [id number]  BASIC METABOLIC PANEL WITH GFR    EKG: None  Radiology: CT ANGIO HEAD NECK W WO CM Result Date: 05/13/2024 EXAM: CTA HEAD AND NECK WITH AND WITHOUT 05/13/2024 12:42:47 AM TECHNIQUE: CTA of the head and neck was performed with and without the administration of intravenous contrast. Multiplanar 2D and/or 3D reformatted images are provided for review. Automated exposure control, iterative reconstruction, and/or weight based adjustment of the mA/kV was utilized to reduce the radiation dose to as low as reasonably achievable. Stenosis of the internal carotid arteries measured using NASCET criteria. COMPARISON: None available CLINICAL HISTORY: headache, different/new, fam hx of aneurysm FINDINGS: AORTIC ARCH AND ARCH VESSELS: No dissection or arterial injury. No significant stenosis of the brachiocephalic or subclavian arteries. CERVICAL CAROTID ARTERIES: No dissection, arterial injury, or hemodynamically significant stenosis by NASCET criteria. CERVICAL VERTEBRAL ARTERIES: No dissection, arterial injury, or significant stenosis.  LUNGS AND MEDIASTINUM: Unremarkable. SOFT TISSUES: No acute abnormality. BONES: No acute abnormality. ANTERIOR CIRCULATION: No significant stenosis of the internal carotid arteries. No significant stenosis of the anterior cerebral arteries. No significant stenosis of the middle cerebral arteries. No aneurysm. POSTERIOR CIRCULATION: No significant stenosis of the posterior cerebral arteries. No significant stenosis of the basilar artery. No significant stenosis of the vertebral arteries. No aneurysm. OTHER: No dural venous sinus thrombosis on this non-dedicated study. IMPRESSION: 1. No large vessel occlusion, hemodynamically significant stenosis, or aneurysm in the head or neck. Electronically signed by: Gilmore Molt 05/13/2024 01:33 AM EST RP Workstation: [id number]     Procedures   Medications Ordered in the ED  prochlorperazine  (COMPAZINE ) injection 10 mg (10 mg Intravenous Given 05/13/24 0052)  diphenhydrAMINE  (BENADRYL ) injection 12.5 mg (12.5 mg Intravenous Given 05/13/24 0052)  iohexol  (OMNIPAQUE ) 350 MG/ML injection 75 mL (75 mLs Intravenous Contrast Given 05/13/24 0042)    Clinical Course as of 05/13/24 0212  Sun May 13, 2024  0150 Patient does report some dull persistent headache but overall improved. [CH]    Clinical Course User Index [CH] Kailany Dinunzio, Charmaine FALCON, MD                                 Medical Decision Making Amount and/or Complexity of Data Reviewed Labs: ordered. Radiology: ordered.  Risk Prescription drug management.   This patient presents to the ED for concern of headache, this involves an extensive number of treatment options, and is a complaint that carries with it a high risk of complications and morbidity.  I considered the following differential and admission for this acute, potentially life threatening condition.  The differential diagnosis includes migraine, complex migraine, tension headache, intracranial abnormality such as SAH or aneurysm, mass,  intracranial hypertension  MDM:    This is a 38 year old female who presents with headache.  History of migraines.  This has been ongoing and subacute nature.  Pain however now radiates into the left eye.  She is nontoxic.  Vital signs are largely reassuring.  Neurologic exam is normal.  Eye exam is reassuring.  Patient given  a migraine cocktail.  Given increase in pain with bending over and Valsalva, did obtain CT imaging.  CT imaging is negative for acute aneurysm or mass.  Considered ICH but patient denies any peripheral vision changes and has a history of migraines making migraine variant more likely.  Patient had some improvement with a migraine cocktail.  Will refer to neurology given persistence and worsening of symptoms.  (Labs, imaging, consults)  Labs: I Ordered, and personally interpreted labs.  The pertinent results include: None  Imaging Studies ordered: I ordered imaging studies including CTA head and neck I independently visualized and interpreted imaging. I agree with the radiologist interpretation  Additional history obtained from chart review.  External records from outside source obtained and reviewed including prior evaluations  Cardiac Monitoring: The patient was maintained on a cardiac monitor.  If on the cardiac monitor, I personally viewed and interpreted the cardiac monitored which showed an underlying rhythm of: NS  Reevaluation: After the interventions noted above, I reevaluated the patient and found that they have :improved  Social Determinants of Health:  lives independently  Disposition: Discharge  Co morbidities that complicate the patient evaluation  Past Medical History:  Diagnosis Date   Acid reflux 2004   Anemia    Anxiety 2008   Asthma 1993   no previous intubations or hospitalizations    Carpal tunnel syndrome    Chlamydia    Depression 2008   no meds, currenly ok   Eczema    Environmental allergies    GERD (gastroesophageal reflux  disease)    Migraines    Miscarriage    Ovarian cyst    Pre-diabetes    Ulcer of the stomach and intestine 2004   Urinary tract infection      Medicines Meds ordered this encounter  Medications   prochlorperazine  (COMPAZINE ) injection 10 mg   diphenhydrAMINE  (BENADRYL ) injection 12.5 mg   iohexol  (OMNIPAQUE ) 350 MG/ML injection 75 mL    I have reviewed the patients home medicines and have made adjustments as needed  Problem List / ED Course: Problem List Items Addressed This Visit   None Visit Diagnoses       Other migraine without status migrainosus, not intractable    -  Primary   Relevant Orders   Ambulatory referral to Neurology                Final diagnoses:  Other migraine without status migrainosus, not intractable    ED Discharge Orders          Ordered    Ambulatory referral to Neurology       Comments: An appointment is requested in approximately: 2 weeks   05/13/24 0210               Bari Charmaine FALCON, MD 05/13/24 [phone]  "

## 2024-05-13 NOTE — ED Notes (Signed)
 ED Provider at bedside.

## 2024-05-15 ENCOUNTER — Other Ambulatory Visit: Payer: Self-pay

## 2024-05-15 ENCOUNTER — Emergency Department (HOSPITAL_BASED_OUTPATIENT_CLINIC_OR_DEPARTMENT_OTHER): Admission: EM | Admit: 2024-05-15 | Discharge: 2024-05-15 | Disposition: A | Discharge status: Home / Self Care

## 2024-05-15 ENCOUNTER — Emergency Department (HOSPITAL_BASED_OUTPATIENT_CLINIC_OR_DEPARTMENT_OTHER): Admitting: Radiology

## 2024-05-15 DIAGNOSIS — Z91040 Latex allergy status: Secondary | ICD-10-CM | POA: Insufficient documentation

## 2024-05-15 DIAGNOSIS — J181 Lobar pneumonia, unspecified organism: Secondary | ICD-10-CM | POA: Insufficient documentation

## 2024-05-15 DIAGNOSIS — J189 Pneumonia, unspecified organism: Secondary | ICD-10-CM

## 2024-05-15 DIAGNOSIS — R509 Fever, unspecified: Secondary | ICD-10-CM | POA: Diagnosis present

## 2024-05-15 MED ORDER — KETOROLAC TROMETHAMINE 15 MG/ML IJ SOLN
15.0000 mg | Freq: Once | INTRAMUSCULAR | Status: AC
  Administered 2024-05-15: 15 mg via INTRAMUSCULAR
  Filled 2024-05-15: qty 1

## 2024-05-15 MED ORDER — NAPROXEN 500 MG PO TABS: 500.0000 mg | ORAL_TABLET | Freq: Two times a day (BID) | ORAL | 0 refills | Status: AC

## 2024-05-15 MED ORDER — AZITHROMYCIN 250 MG PO TABS: 250.0000 mg | ORAL_TABLET | Freq: Every day | ORAL | 0 refills | Status: AC

## 2024-05-15 NOTE — ED Provider Notes (Signed)
 " [place] EMERGENCY DEPARTMENT AT [facility] Provider Note   CSN: [id number] Arrival date & time: 05/15/24  8277    Patient presents with: Influenza   Laurie Reed is a 38 y.o. female here for evaluation of feeling unwell.  Patient here with headache, myalgias, fever, cough, congestion, rhinorrhea, scratchy throat.  Was seen here 3 days ago and had negative CTA, blood work, COVID, flu testing.  No neck stiffness or neck rigidity.  States she is still feeling unwell.  Some nausea without vomiting, overall decreased appetite   HPI     Prior to Admission medications  Medication Sig Start Date End Date Taking? Authorizing Provider  azithromycin  (ZITHROMAX ) 250 MG tablet Take 1 tablet (250 mg total) by mouth daily. Take first 2 tablets together, then 1 every day until finished. 05/15/24  Yes Alfreddie Consalvo A, PA-C  naproxen  (NAPROSYN ) 500 MG tablet Take 1 tablet (500 mg total) by mouth 2 (two) times daily. 05/15/24  Yes Allyah Heather A, PA-C  acetaminophen  (TYLENOL ) 500 MG tablet Take 1,000 mg by mouth every 6 (six) hours as needed for mild pain (pain score 1-3), moderate pain (pain score 4-6) or headache (or migraines).    [provider]  albuterol  (PROVENTIL ) (2.5 MG/3ML) 0.083% nebulizer solution Use 1 vial via nebulizer every 6 hours and as needed Patient taking differently: Take 2.5 mg by nebulization every 6 (six) hours. Shortness of breath 05/15/21     albuterol  (VENTOLIN  HFA) 108 (90 Base) MCG/ACT inhaler Inhale 2 puffs into the lungs 4 (four) times daily as needed. Patient taking differently: Inhale 2 puffs into the lungs daily as needed for shortness of breath or wheezing. 01/28/23     amitriptyline  (ELAVIL ) 25 MG tablet Take 1 tablet (25 mg total) by mouth at bedtime as needed. Patient taking differently: Take 25 mg by mouth at bedtime as needed for sleep. 12/08/22     cetirizine  (ZYRTEC ) 10 MG tablet Take 1 tablet (10 mg total) by mouth every evening as  needed for allergies. 12/08/22     diphenhydrAMINE  (BENADRYL ) 25 mg capsule Take 25-50 mg by mouth 2 (two) times daily as needed for allergies.    [provider]  doxycycline  (VIBRA -TABS) 100 MG tablet Take 1 tablet (100 mg total) by mouth every 12 (twelve) hours. 10/11/23   Jadine Toribio SQUIBB, MD  ferrous sulfate  324 (65 Fe) MG TBEC 1 pill daily after meal to help with anemia 11/22/18   [name], [name], MD  fluticasone -salmeterol (ADVAIR  DISKUS) 500-50 MCG/ACT AEPB Inhale 1 puff into the lungs in the morning and at bedtime. 01/29/23   Meade Verdon RAMAN, MD  gabapentin  (NEURONTIN ) 100 MG capsule Take 1 capsule (100 mg total) by mouth 3 (three) times daily. 01/09/24     HYDROcodone  bit-homatropine (HYCODAN) 5-1.5 MG/5ML syrup Take 5 mLs by mouth every 6 (six) hours as needed for cough. 10/18/23   Meade Verdon RAMAN, MD  ipratropium-albuterol  (DUONEB) 0.5-2.5 (3) MG/3ML SOLN Take 3 mLs by nebulization every 6 (six) hours as needed. 07/05/23   Mecum, Erin E, PA-C  ketorolac  (TORADOL ) 10 MG tablet Take 1 tablet (10 mg total) by mouth every 6 (six) hours as needed. 04/17/24   Jerrol Agent, MD  levocetirizine (XYZAL ) 5 MG tablet Take 1 tablet (5 mg total) by mouth every evening. 06/16/22   Padgett, Danita Macintosh, MD  omeprazole  (PRILOSEC) 40 MG capsule Take 1 capsule (40 mg total) by mouth daily. 12/24/21 12/24/22  Meade Verdon RAMAN, MD  oxyCODONE -acetaminophen  (PERCOCET/ROXICET) [phone]  MG tablet Take 1 tablet by mouth every 4-6 (four to six) hours as needed for pain. 10/28/23     oxyCODONE -acetaminophen  (PERCOCET/ROXICET) 5-325 MG tablet Take 1 tablet by mouth every 4 (four) to 6 (six) hours as needed for pain for 5 days. 11/23/23     oxyCODONE -acetaminophen  (PERCOCET/ROXICET) 5-325 MG tablet Take 1 tablet by mouth every 4-6 hours as needed for pain. 04/25/24     phentermine  (ADIPEX-P ) 37.5 MG tablet Take 1 tablet (37.5 mg total) by mouth in the morning. 12/08/22     RYALTRIS  665-25 MCG/ACT SUSP 2 sprays per nostril 1-2  times daily. 06/16/22   Jeneal Danita Macintosh, MD  SUMAtriptan  (IMITREX ) 100 MG tablet Take 1 tablet (100 mg total) by mouth as needed at the onset of migraine symptoms. May repeat in 1-2 (one to two) hours if persistent. Max daily dose 200 mg. 01/09/24     tiZANidine  (ZANAFLEX ) 4 MG tablet Take 1 tablet (4 mg total) by mouth at bedtime. 11/20/22   Curatolo, Adam, DO  topiramate  (TOPAMAX ) 100 MG tablet Take 1 tablet (100 mg total) by mouth daily. 01/09/24     triamcinolone  cream (KENALOG ) 0.1 % Apply 1 Application topically 2 (two) times daily as needed. 11/17/22     Vitamin D , Ergocalciferol , 50000 units CAPS Take 1 capsule (50,000 Units) by mouth once a week. 12/08/22     dicyclomine  (BENTYL ) 10 MG capsule Take 1 capsule (10 mg total) by mouth 4 (four) times daily -  before meals and at bedtime. For stomach cramping Patient not taking: Reported on 10/25/2019 06/13/19 10/25/19  [name], Lannie, MD  sucralfate  (CARAFATE ) 1 GM/10ML suspension Take 10 mLs (1 g total) by mouth 4 (four) times daily -  with meals and at bedtime. Patient not taking: Reported on 10/25/2019 06/13/19 10/25/19  Alec Lannie, MD    Allergies: Latex, Other, Zofran  [ondansetron ], and Tree extract    Review of Systems  Constitutional:  Positive for activity change, appetite change, fatigue and fever.  HENT:  Positive for congestion, postnasal drip, rhinorrhea, sinus pain and sore throat. Negative for trouble swallowing and voice change.   Respiratory:  Positive for cough.   Cardiovascular: Negative.   Gastrointestinal:  Positive for nausea. Negative for vomiting.  Genitourinary: Negative.   Musculoskeletal:  Positive for myalgias. Negative for neck pain and neck stiffness.  Neurological:  Positive for weakness (generalized) and headaches. Negative for dizziness, light-headedness and numbness.  All other systems reviewed and are negative.   Updated Vital Signs Pulse 100   Temp 100.2 F (37.9 C) (Oral)   Resp 20   LMP  (LMP  Unknown)   SpO2 99%   Physical Exam Vitals and nursing note reviewed.  Constitutional:      General: She is not in acute distress.    Appearance: She is well-developed. She is not ill-appearing, toxic-appearing or diaphoretic.  HENT:     Head: Normocephalic and atraumatic.     Nose: Congestion and rhinorrhea present.  Eyes:     Pupils: Pupils are equal, round, and reactive to light.  Neck:     Comments: Full rom, no rigidity, meningismus Cardiovascular:     Rate and Rhythm: Normal rate.     Pulses: Normal pulses.     Heart sounds: Normal heart sounds.  Pulmonary:     Effort: Pulmonary effort is normal. No respiratory distress.     Breath sounds: Normal breath sounds.     Comments: Mild course lung sounds at BIL lower bases. Hoarse  voice Abdominal:     General: Bowel sounds are normal. There is no distension.     Palpations: Abdomen is soft.     Tenderness: There is no abdominal tenderness. There is no guarding or rebound.  Musculoskeletal:        General: No swelling, tenderness, deformity or signs of injury. Normal range of motion.     Cervical back: Normal range of motion.     Right lower leg: No edema.     Left lower leg: No edema.  Skin:    General: Skin is warm and dry.  Neurological:     General: No focal deficit present.     Mental Status: She is alert.  Psychiatric:        Mood and Affect: Mood normal.    (all labs ordered are listed, but only abnormal results are displayed) Labs Reviewed - No data to display  EKG: None  Radiology: DG Chest 2 View Result Date: 05/15/2024 CLINICAL DATA:  Cough. EXAM: CHEST - 2 VIEW COMPARISON:  Chest radiograph dated 09/17/2023. FINDINGS: Faint bibasilar peribronchial densities may represent atelectasis. Atypical infiltrate is not excluded. No focal consolidation, pleural effusion, or pneumothorax. The cardiac silhouette is within normal limits. No acute osseous pathology. IMPRESSION: Faint bibasilar peribronchial densities  may represent atelectasis. Atypical infiltrate is not excluded. Electronically Signed   By: Vanetta Chou M.D.   On: 05/15/2024 19:22     Procedures   Medications Ordered in the ED  ketorolac  (TORADOL ) 15 MG/ML injection 15 mg (15 mg Intramuscular Given 05/15/24 7037)    38 year old here for evaluation feeling unwell.  Was seen the ED 3 days ago for similar symptoms had viral panel testing which was negative as well as blood work CTA head and neck.  Here she is a nonfocal neuroexam.  She continues to have URI symptoms.  No neck stiffness or neck rigidity I have low suspicion for meningitis.  Has some very mild rhonchi at lower lobes, hoarse voice. PO clear.  Ambulatory here.  No hypoxia.  Suspect she likely has influenza-like illness.  Imaging personally viewed and interpreted Chest x-ray shows possible atypical infection.  Given her recent negative viral panel testing we will treat for CAP, with azithromycin .  Encouraged rest, symptomatic management at home.  Low suspicion for PE, dissection, pneumothorax, meningitis, PTA, RPA, strep pharyngitis, dural thrombosis, IIH, demyelinating process, bleed, ischemia.  The patient has been appropriately medically screened and/or stabilized in the ED. I have low suspicion for any other emergent medical condition which would require further screening, evaluation or treatment in the ED or require inpatient management.  Patient is hemodynamically stable and in no acute distress.  Patient able to ambulate in department prior to ED.  Evaluation does not show acute pathology that would require ongoing or additional emergent interventions while in the emergency department or further inpatient treatment.  I have discussed the diagnosis with the patient and answered all questions.  Pain is been managed while in the emergency department and patient has no further complaints prior to discharge.  Patient is comfortable with plan discussed in room and is stable for  discharge at this time.  I have discussed strict return precautions for returning to the emergency department.  Patient was encouraged to follow-up with PCP/specialist refer to at discharge.  Medical Decision Making Amount and/or Complexity of Data Reviewed External Data Reviewed: labs, radiology and notes. Radiology: ordered and independent interpretation performed. Decision-making details documented in ED Course.  Risk OTC drugs. Prescription drug management. Decision regarding hospitalization. Diagnosis or treatment significantly limited by social determinants of health.        Final diagnoses:  Pneumonia of both lower lobes due to infectious organism    ED Discharge Orders          Ordered    azithromycin  (ZITHROMAX ) 250 MG tablet  Daily        05/15/24 1931    naproxen  (NAPROSYN ) 500 MG tablet  2 times daily        05/15/24 1931               Jeanna Giuffre A, PA-C 05/15/24 2010    Kammerer, Duwaine CROME, DO 05/15/24 2348  "

## 2024-05-15 NOTE — Discharge Instructions (Signed)
Take the medication as prescribed.  Return for new or worsening symptoms. 

## 2024-05-15 NOTE — ED Triage Notes (Signed)
 Reports flu like symptoms starting this morning. Seen here for similar on 12/27.

## 2024-05-23 ENCOUNTER — Ambulatory Visit (INDEPENDENT_AMBULATORY_CARE_PROVIDER_SITE_OTHER): Payer: Self-pay | Admitting: Neurology

## 2024-05-23 ENCOUNTER — Encounter: Payer: Self-pay | Admitting: Neurology

## 2024-05-23 VITALS — BP 116/69 | HR 82 | Ht 62.0 in | Wt 284.0 lb

## 2024-05-23 DIAGNOSIS — G4489 Other headache syndrome: Secondary | ICD-10-CM | POA: Diagnosis not present

## 2024-05-23 DIAGNOSIS — Z8249 Family history of ischemic heart disease and other diseases of the circulatory system: Secondary | ICD-10-CM | POA: Diagnosis not present

## 2024-05-23 DIAGNOSIS — R351 Nocturia: Secondary | ICD-10-CM | POA: Diagnosis not present

## 2024-05-23 DIAGNOSIS — R0683 Snoring: Secondary | ICD-10-CM | POA: Diagnosis not present

## 2024-05-23 DIAGNOSIS — G4719 Other hypersomnia: Secondary | ICD-10-CM

## 2024-05-23 DIAGNOSIS — Z6841 Body Mass Index (BMI) 40.0 and over, adult: Secondary | ICD-10-CM | POA: Diagnosis not present

## 2024-05-23 DIAGNOSIS — R519 Headache, unspecified: Secondary | ICD-10-CM

## 2024-05-23 NOTE — Progress Notes (Signed)
 Subjective:    Patient ID: Laurie Reed is a 39 y.o. female.  HPI    True Mar, MD, PhD [facility] [address], [address] [address] [place], [place] [address]   I saw patient, Laurie Reed, as a referral from the emergency room for evaluation of her headaches.  The patient is unaccompanied today.  [name] is a 39 year old female with an underlying medical history of asthma, anemia, reflux disease, carpal tunnel syndrome, depression, anxiety, eczema, allergies, ovarian cyst, prediabetes, and morbid obesity with a BMI of over 50, who reports a history of migraine headaches for years.  She has been treated with Topamax  and Imitrex  as needed through her PCP.  She also had a prescription for gabapentin  for back pain in the past.  She had a prescription for amitriptyline  for sleep in the past but currently has no refills on these medications.  Headaches are often left-sided and behind the left eye.  She had a recent eye examination with an optometrist but did not get a good feel and wanted to pursue a second opinion.  She has not pursued it yet.  She did get a prescription for new eyeglasses but did not decide to pick them up yet as she wanted to get another eye examination.  She denies any sudden onset of one-sided weakness or numbness or tingling or vision loss or droopy face.  She has a history of carpal tunnel syndrome both sides and had surgery on the left.  She works nights as a LAWYER.  She works from 7 PM to 7 AM.  She lives at home with her 3 children, ages 32, 46, 57 and 101.  She drinks quite a bit of caffeine  in the form of soda and is trying to cut back.  She is trying to work on weight loss and was on phentermine  but currently does not have a prescription for it.  She reports a family history of brain aneurysm in a great aunt on dad side.  She tries to hydrate well with water .  She does not sleep well.  She has trouble falling asleep and staying asleep.  Epworth  sleepiness score is 14 out of 24, fatigue severity score is 49 out of 63.  She snores according to her family.  She presented to the emergency room at [facility] on 05/12/2024 with a chief complaint of headache.  I reviewed the emergency room records.  She was treated symptomatically with Benadryl , Compazine , and IV fluids.  She had a CT angiogram of the head and neck with and without contrast on 05/13/2024 and I reviewed the results:    IMPRESSION: 1. No large vessel occlusion, hemodynamically significant stenosis, or aneurysm in the head or neck.  Of note, she presented to the emergency room on 05/15/2024 with a chief complaint of influenza.  I reviewed the emergency room records.  She was treated with Toradol  and was suspected to have pneumonia.  She was given a prescription for azithromycin  and naproxen .  Of note, she has been on topiramate .  She takes phentermine .  She has been on Imitrex  as needed.  She has a prescription for Zanaflex .  She also has a prescription for gabapentin .    She had seen Dr. Tobie at [facility] neurology for left hand pain in the past.  She had a sleep study through [facility] pulmonology on 09/20/2019.  She did not have significant sleep apnea at the time.  Her AHI was 0.5/h, O2 nadir  89%.  She had moderate snoring.  Her Past Medical History Is Significant For: Past Medical History:  Diagnosis Date   Acid reflux 2004   Anemia    Anxiety 2008   Asthma 1993   no previous intubations or hospitalizations    Carpal tunnel syndrome    Chlamydia    Depression 2008   no meds, currenly ok   Eczema    Environmental allergies    GERD (gastroesophageal reflux disease)    Migraines    Miscarriage    Ovarian cyst    Pre-diabetes    Ulcer of the stomach and intestine 2004   Urinary tract infection     Her Past Surgical History Is Significant For: Past Surgical History:  Procedure Laterality Date   CARPAL TUNNEL RELEASE Left    COLONOSCOPY  2004    IRRIGATION AND DEBRIDEMENT SEBACEOUS CYST N/A 06/01/2017   Procedure: EXCISION SEBACEOUS CYST ON SCALP;  Surgeon: Nicholaus Selinda Birmingham, MD;  Location: [facility] ORS;  Service: General;  Laterality: N/A;   MULTIPLE TOOTH EXTRACTIONS  2015   6 teeth removed    TUBAL LIGATION N/A 04/04/2018   Procedure: POST PARTUM TUBAL LIGATION;  Surgeon: Barbra Lang PARAS, DO;  Location: [facility];  Service: Gynecology;  Laterality: N/A;   TYMPANOSTOMY TUBE PLACEMENT     WISDOM TOOTH EXTRACTION  2007    Her Family History Is Significant For: Family History  Problem Relation Age of Onset   Asthma Mother    Heart disease Mother    Hypertension Mother    Clotting disorder Mother    Diabetes Mother    Osteoarthritis Mother    Other Mother        DDD   High Cholesterol Mother    Anemia Mother    Migraines Mother    Healthy Brother    Asthma Maternal Aunt    Hypertension Maternal Aunt    Diabetes Maternal Aunt    Asthma Maternal Uncle    Hypertension Maternal Uncle    Diabetes Maternal Uncle    Diabetes Maternal Uncle    Heart disease Maternal Uncle    Diabetes Maternal Uncle    Cancer Paternal Aunt    Heart disease Maternal Grandmother    Diabetes Maternal Grandmother    Hypertension Maternal Grandmother    Heart disease Maternal Grandfather    Diabetes Maternal Grandfather    Hypertension Maternal Grandfather    Hypertension Paternal Grandmother    Allergies Daughter    Asthma Daughter    Migraines Daughter    Allergies Daughter    Migraines Daughter    Asthma Daughter    Hypertension Son    ADD / ADHD Son    Asthma Son    Allergies Son    Allergies Son    Asthma Son    GER disease Son    Hypotension Neg Hx    Anesthesia problems Neg Hx    Malignant hyperthermia Neg Hx    Pseudochol deficiency Neg Hx    Sleep apnea Neg Hx     Her Social History Is Significant For: Social History   Socioeconomic History   Marital status: Divorced    Spouse name: Not on file   Number  of children: 4   Years of education: 12    Highest education level: Not on file  Occupational History    Employer: [employer]  Tobacco Use   Smoking status: Never    Passive exposure: Never   Smokeless tobacco:  Never  Vaping Use   Vaping status: Never Used  Substance and Sexual Activity   Alcohol use: No    Alcohol/week: 0.0 standard drinks of alcohol   Drug use: No   Sexual activity: Not on file  Other Topics Concern   Not on file  Social History Narrative   Lives with 3 children.   Immediate family in [place].    Born in [place].   Raised in [place], [place].    Lives in a 2 story home.   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)   Education: high school   Social Drivers of Health   Tobacco Use: Low Risk (05/23/2024)   Patient History    Smoking Tobacco Use: Never    Smokeless Tobacco Use: Never    Passive Exposure: Never  Financial Resource Strain: Not on file  Food Insecurity: No Food Insecurity (10/09/2023)   Hunger Vital Sign    Worried About Running Out of Food in the Last Year: Never true    Ran Out of Food in the Last Year: Never true  Transportation Needs: No Transportation Needs (10/09/2023)   PRAPARE - Administrator, Civil Service (Medical): No    Lack of Transportation (Non-Medical): No  Physical Activity: Not on file  Stress: Not on file  Social Connections: Unknown (04/09/2022)   Received from [facility]   Social Network    Social Network: Not on file  Depression (PHQ2-9): Not on file  Alcohol Screen: Not on file  Housing: Low Risk (10/09/2023)   Housing Stability Vital Sign    Unable to Pay for Housing in the Last Year: No    Number of Times Moved in the Last Year: 0    Homeless in the Last Year: No  Utilities: Not At Risk (10/09/2023)   AHC Utilities    Threatened with loss of utilities: No  Health Literacy: Not on file    Her Allergies Are:  Allergies[1]:   Her Current Medications Are:  Outpatient Encounter Medications as of  05/23/2024  Medication Sig   acetaminophen  (TYLENOL ) 500 MG tablet Take 1,000 mg by mouth every 6 (six) hours as needed for mild pain (pain score 1-3), moderate pain (pain score 4-6) or headache (or migraines).   albuterol  (PROVENTIL ) (2.5 MG/3ML) 0.083% nebulizer solution Use 1 vial via nebulizer every 6 hours and as needed (Patient taking differently: Take 2.5 mg by nebulization every 6 (six) hours. Shortness of breath)   albuterol  (VENTOLIN  HFA) 108 (90 Base) MCG/ACT inhaler Inhale 2 puffs into the lungs 4 (four) times daily as needed. (Patient taking differently: Inhale 2 puffs into the lungs daily as needed for shortness of breath or wheezing.)   cetirizine  (ZYRTEC ) 10 MG tablet Take 1 tablet (10 mg total) by mouth every evening as needed for allergies.   diphenhydrAMINE  (BENADRYL ) 25 mg capsule Take 25-50 mg by mouth 2 (two) times daily as needed for allergies.   fluticasone -salmeterol (ADVAIR  DISKUS) 500-50 MCG/ACT AEPB Inhale 1 puff into the lungs in the morning and at bedtime.   gabapentin  (NEURONTIN ) 100 MG capsule Take 1 capsule (100 mg total) by mouth 3 (three) times daily.   ipratropium-albuterol  (DUONEB) 0.5-2.5 (3) MG/3ML SOLN Take 3 mLs by nebulization every 6 (six) hours as needed.   ketorolac  (TORADOL ) 10 MG tablet Take 1 tablet (10 mg total) by mouth every 6 (six) hours as needed.   levocetirizine (XYZAL ) 5 MG tablet Take 1 tablet (5 mg total) by mouth every evening.  naproxen  (NAPROSYN ) 500 MG tablet Take 1 tablet (500 mg total) by mouth 2 (two) times daily.   omeprazole  (PRILOSEC) 40 MG capsule Take 1 capsule (40 mg total) by mouth daily.   oxyCODONE -acetaminophen  (PERCOCET/ROXICET) 5-325 MG tablet Take 1 tablet by mouth every 4-6 hours as needed for pain.   phentermine  (ADIPEX-P ) 37.5 MG tablet Take 1 tablet (37.5 mg total) by mouth in the morning.   RYALTRIS  665-25 MCG/ACT SUSP 2 sprays per nostril 1-2 times daily.   SUMAtriptan  (IMITREX ) 100 MG tablet Take 1 tablet (100 mg  total) by mouth as needed at the onset of migraine symptoms. May repeat in 1-2 (one to two) hours if persistent. Max daily dose 200 mg.   tiZANidine  (ZANAFLEX ) 4 MG tablet Take 1 tablet (4 mg total) by mouth at bedtime.   topiramate  (TOPAMAX ) 100 MG tablet Take 1 tablet (100 mg total) by mouth daily.   triamcinolone  cream (KENALOG ) 0.1 % Apply 1 Application topically 2 (two) times daily as needed.   amitriptyline  (ELAVIL ) 25 MG tablet Take 1 tablet (25 mg total) by mouth at bedtime as needed. (Patient not taking: Reported on 05/23/2024)   azithromycin  (ZITHROMAX ) 250 MG tablet Take 1 tablet (250 mg total) by mouth daily. Take first 2 tablets together, then 1 every day until finished.   doxycycline  (VIBRA -TABS) 100 MG tablet Take 1 tablet (100 mg total) by mouth every 12 (twelve) hours.   ferrous sulfate  324 (65 Fe) MG TBEC 1 pill daily after meal to help with anemia   HYDROcodone  bit-homatropine (HYCODAN) 5-1.5 MG/5ML syrup Take 5 mLs by mouth every 6 (six) hours as needed for cough.   oxyCODONE -acetaminophen  (PERCOCET/ROXICET) 5-325 MG tablet Take 1 tablet by mouth every 4-6 (four to six) hours as needed for pain.   oxyCODONE -acetaminophen  (PERCOCET/ROXICET) 5-325 MG tablet Take 1 tablet by mouth every 4 (four) to 6 (six) hours as needed for pain for 5 days.   Vitamin D , Ergocalciferol , 50000 units CAPS Take 1 capsule (50,000 Units) by mouth once a week. (Patient not taking: Reported on 05/23/2024)   [DISCONTINUED] dicyclomine  (BENTYL ) 10 MG capsule Take 1 capsule (10 mg total) by mouth 4 (four) times daily -  before meals and at bedtime. For stomach cramping (Patient not taking: Reported on 10/25/2019)   [DISCONTINUED] sucralfate  (CARAFATE ) 1 GM/10ML suspension Take 10 mLs (1 g total) by mouth 4 (four) times daily -  with meals and at bedtime. (Patient not taking: Reported on 10/25/2019)   No facility-administered encounter medications on file as of 05/23/2024.  :   Review of Systems:  Out of a  complete 14 point review of systems, all are reviewed and negative with the exception of these symptoms as listed below:  Review of Systems  Objective:  Neurological Exam  Physical Exam Physical Examination:   Vitals:   05/23/24 1538  BP: 116/69  Pulse: 82    General Examination: The patient is a very pleasant 39 y.o. female in no acute distress. She appears well-developed and well-nourished and well groomed.   HEENT: Normocephalic, atraumatic, pupils are equal, round and reactive to light, extraocular tracking is good without limitation to gaze excursion or nystagmus noted. No photophobia.  Funduscopic exam benign.  No Corrective eye glasses in place. Hearing is grossly intact.  Face is symmetric with normal facial animation. Speech is clear without dysarthria. There is no hypophonia. There is no lip, neck/head, jaw or voice tremor. Neck is supple with full range of passive and active motion. There are  no carotid bruits on auscultation.  Airway/Oropharynx exam reveals: mild mouth dryness, adequate dental hygiene and mild airway crowding, due to small airway entry, tonsillar size of about 1-2+ bilaterally.  Tongue protrudes centrally and palate elevates symmetrically, neck circumference 16-3/8 inches.  Chest: Clear to auscultation without wheezing, rhonchi or crackles noted.  Heart: S1+S2+0, regular and normal without murmurs, rubs or gallops noted.   Abdomen: Soft, non-tender and non-distended.  Extremities: There is trace pitting edema in the distal lower extremities bilaterally.   Skin: Warm and dry without trophic changes noted.   Musculoskeletal: exam reveals right knee pain and left foot pain.   Neurologically:  Mental status: The patient is awake, alert and oriented in all 4 spheres. Her immediate and remote memory, attention, language skills and fund of knowledge are appropriate. There is no evidence of aphasia, agnosia, apraxia or anomia. Speech is clear with normal  prosody and enunciation. Thought process is linear. Mood is normal and affect is normal.  Cranial nerves II - XII are as described above under HEENT exam.  Motor exam: Normal bulk, strength and tone is noted. There is no obvious action or resting tremor.  No drift or rebound, no postural or intention tremor. Fine motor skills and coordination: Intact finger taps, hand movements and rapid alternating patting with both upper extremities, normal foot taps bilaterally in the lower extremities.  Cerebellar testing: No dysmetria or intention tremor. There is no truncal or gait ataxia.  Normal finger-to-nose, normal heel-to-shin bilaterally. Sensory exam: intact to light touch in the upper and lower extremities.  Reflexes are 1+ in the upper extremities, trace in the left knee, I avoided the right knee due to pain and she has trace reflexes in the ankles bilaterally.  Toes are downgoing bilaterally. Romberg negative. Gait, station and balance: She stands slowly, she is able to stand narrow-base.  She walks without a limp.  Tandem walk is unremarkable.  Assessment and plan:  In summary, Jonica Kitagawa is a very pleasant 39 y.o.-year old female with an underlying medical history of asthma, anemia, reflux disease, carpal tunnel syndrome, depression, anxiety, eczema, allergies, ovarian cyst, prediabetes, and morbid obesity with a BMI of over 50, who presents for evaluation of her recurrent headaches over several years duration with recent increase reported.  History and examination and constellation of symptoms are more in keeping with a mixed type of headache rather than classic Migraines only.  I had a long discussion with the patient today.  She has a nonfocal neurological exam which is reassuring of course.  This was an extended visit of over 60 minutes with copious record review involved, personal imaging test reviews, considerable counseling and coordination of care, addressing multiple issues.   Below is a  summary of my recommendations and our discussion points from today's visit, based on chart review, history and examination. They were given these instructions verbally during the visit in detail and also in writing in the MyChart after visit summary (AVS), which they can access electronically.  <<  Please remember, common headache triggers are: sleep deprivation, dehydration, overheating, stress, hypoglycemia or skipping meals and blood sugar fluctuations, excessive pain medications or excessive alcohol use or caffeine  withdrawal. Some people have food triggers such as aged cheese, orange juice or chocolate, especially dark chocolate, or MSG (monosodium glutamate). Try to avoid these headache triggers as much possible. It may be helpful to keep a headache diary to figure out what makes your headaches worse or brings them on and what alleviates  them. Some people report headache onset after exercise but studies have shown that regular exercise may actually prevent headaches from coming. If you have exercise-induced headaches, please make sure that you drink plenty of fluid before and after exercising and that you do not over do it and do not overheat. Please reduce your caffeine /soda intake as caffeine  can drive your headaches.  Try to limit yourself to 1 serving or less per day.   Try to get about 7 to 8 hours of sleep.   Continue with your current medications for migraines through your PCP.  You will need to ask your PCP for refills.   Avoid taking phentermine  as it can also perpetuate headaches.   Talk to your primary care about other options for your weight loss.   Make sure you try to hydrate well, 64 to 80 ounces of water  per day are recommended.   We will do a brain scan, called MRI and call you with the test results. We will have to schedule you for this on a separate date. This test requires authorization from your insurance, and we will take care of the insurance process. I will order a home sleep  test to look for signs of obstructive sleep apnea (aka OSA). As explained, the long-term risks and ramifications of untreated moderate to severe obstructive sleep apnea may include (but are not limited to): increased risk for cardiovascular disease, including congestive heart failure, stroke, difficult to control hypertension, treatment resistant obesity, arrhythmias, especially irregular heartbeat commonly known as A. Fib. (atrial fibrillation); even type 2 diabetes has been linked to untreated OSA.  Please establish with an optometrist of your liking, if you do need eyeglasses, I recommend you trial them consistently as strain on the eyes can also cause headaches. For now, we will keep you posted as to your test results by phone call or MyChart message and follow-up in this office accordingly. >>       [1]  Allergies Allergen Reactions   Latex Hives and Itching   Other Itching    20 different types of trees   Zofran  [Ondansetron ] Itching   Tree Extract Itching

## 2024-05-23 NOTE — Patient Instructions (Signed)
 It was nice to meet you today.   As discussed, your headaches are likely due to a combination of factors.   Here is what we discussed today and my recommendations for you:   Please remember, common headache triggers are: sleep deprivation, dehydration, overheating, stress, hypoglycemia or skipping meals and blood sugar fluctuations, excessive pain medications or excessive alcohol use or caffeine  withdrawal. Some people have food triggers such as aged cheese, orange juice or chocolate, especially dark chocolate, or MSG (monosodium glutamate). Try to avoid these headache triggers as much possible. It may be helpful to keep a headache diary to figure out what makes your headaches worse or brings them on and what alleviates them. Some people report headache onset after exercise but studies have shown that regular exercise may actually prevent headaches from coming. If you have exercise-induced headaches, please make sure that you drink plenty of fluid before and after exercising and that you do not over do it and do not overheat. Please reduce your caffeine /soda intake as caffeine  can drive your headaches.  Try to limit yourself to 1 serving or less per day.   Try to get about 7 to 8 hours of sleep.   Continue with your current medications for migraines through your PCP.  You will need to ask your PCP for refills.   Avoid taking phentermine  as it can also perpetuate headaches.   Talk to your primary care about other options for your weight loss.   Make sure you try to hydrate well, 64 to 80 ounces of water  per day are recommended.   We will do a brain scan, called MRI and call you with the test results. We will have to schedule you for this on a separate date. This test requires authorization from your insurance, and we will take care of the insurance process. I will order a home sleep test to look for signs of obstructive sleep apnea (aka OSA). As explained, the long-term risks and ramifications of  untreated moderate to severe obstructive sleep apnea may include (but are not limited to): increased risk for cardiovascular disease, including congestive heart failure, stroke, difficult to control hypertension, treatment resistant obesity, arrhythmias, especially irregular heartbeat commonly known as A. Fib. (atrial fibrillation); even type 2 diabetes has been linked to untreated OSA.  Please establish with an optometrist of your liking, if you do need eyeglasses, I recommend you trial them consistently as strain on the eyes can also cause headaches. For now, we will keep you posted as to your test results by phone call or MyChart message and follow-up in this office accordingly.

## 2024-05-24 ENCOUNTER — Telehealth: Payer: Self-pay | Admitting: Neurology

## 2024-05-24 NOTE — Telephone Encounter (Signed)
 [employer] shara: [phone] exp. 05/24/24-06/22/24, [facility] NPR sent to [facility] [phone]
